# Patient Record
Sex: Female | Born: 1970
Health system: Southern US, Community
[De-identification: ages and names within clinical notes are randomized; demographics above are authoritative.]

## PROBLEM LIST (undated history)

## (undated) DIAGNOSIS — E119 Type 2 diabetes mellitus without complications: Secondary | ICD-10-CM

## (undated) DIAGNOSIS — I10 Essential (primary) hypertension: Secondary | ICD-10-CM

## (undated) DIAGNOSIS — I471 Supraventricular tachycardia, unspecified: Secondary | ICD-10-CM

## (undated) DIAGNOSIS — J8489 Other specified interstitial pulmonary diseases: Secondary | ICD-10-CM

## (undated) HISTORY — DX: Supraventricular tachycardia: I47.1

## (undated) HISTORY — DX: Supraventricular tachycardia, unspecified: I47.10

## (undated) HISTORY — DX: Essential (primary) hypertension: I10

## (undated) HISTORY — PX: LUNG BIOPSY: SHX232

---

## 2008-07-23 ENCOUNTER — Encounter: Admission: RE | Admit: 2008-07-23 | Discharge: 2008-07-23 | Payer: Self-pay | Admitting: Family Medicine

## 2008-08-26 ENCOUNTER — Encounter: Admission: RE | Admit: 2008-08-26 | Discharge: 2008-08-26 | Payer: Self-pay | Admitting: Family Medicine

## 2008-08-29 ENCOUNTER — Encounter: Admission: RE | Admit: 2008-08-29 | Discharge: 2008-08-29 | Payer: Self-pay | Admitting: Family Medicine

## 2008-09-11 DIAGNOSIS — J449 Chronic obstructive pulmonary disease, unspecified: Secondary | ICD-10-CM | POA: Insufficient documentation

## 2008-09-11 DIAGNOSIS — I10 Essential (primary) hypertension: Secondary | ICD-10-CM | POA: Insufficient documentation

## 2008-09-12 ENCOUNTER — Ambulatory Visit: Payer: Self-pay | Admitting: Pulmonary Disease

## 2008-09-12 DIAGNOSIS — J8409 Other alveolar and parieto-alveolar conditions: Secondary | ICD-10-CM | POA: Insufficient documentation

## 2008-09-12 LAB — CONVERTED CEMR LAB
Basophils Absolute: 0.1 10*3/uL (ref 0.0–0.1)
Eosinophils Absolute: 0.8 10*3/uL — ABNORMAL HIGH (ref 0.0–0.7)
Hemoglobin: 12.2 g/dL (ref 12.0–15.0)
Lymphocytes Relative: 12.1 % (ref 12.0–46.0)
Lymphs Abs: 1.4 10*3/uL (ref 0.7–4.0)
MCHC: 34.2 g/dL (ref 30.0–36.0)
MCV: 77.8 fL — ABNORMAL LOW (ref 78.0–100.0)
Monocytes Absolute: 0.5 10*3/uL (ref 0.1–1.0)
Neutro Abs: 8.8 10*3/uL — ABNORMAL HIGH (ref 1.4–7.7)
RDW: 17.1 % — ABNORMAL HIGH (ref 11.5–14.6)
aPTT: 27.8 s (ref 21.7–28.8)

## 2008-09-17 ENCOUNTER — Ambulatory Visit: Admission: RE | Admit: 2008-09-17 | Discharge: 2008-09-17 | Payer: Self-pay | Admitting: Pulmonary Disease

## 2008-09-17 ENCOUNTER — Encounter: Payer: Self-pay | Admitting: Pulmonary Disease

## 2008-09-17 ENCOUNTER — Ambulatory Visit: Payer: Self-pay | Admitting: Pulmonary Disease

## 2008-09-18 ENCOUNTER — Telehealth (INDEPENDENT_AMBULATORY_CARE_PROVIDER_SITE_OTHER): Payer: Self-pay | Admitting: *Deleted

## 2008-09-23 ENCOUNTER — Ambulatory Visit: Payer: Self-pay | Admitting: Pulmonary Disease

## 2008-10-21 ENCOUNTER — Ambulatory Visit: Payer: Self-pay | Admitting: Pulmonary Disease

## 2008-11-20 ENCOUNTER — Encounter: Payer: Self-pay | Admitting: Pulmonary Disease

## 2008-11-21 ENCOUNTER — Ambulatory Visit: Payer: Self-pay | Admitting: Pulmonary Disease

## 2008-12-02 ENCOUNTER — Ambulatory Visit: Payer: Self-pay | Admitting: Thoracic Surgery

## 2008-12-26 ENCOUNTER — Ambulatory Visit: Payer: Self-pay | Admitting: Thoracic Surgery

## 2008-12-26 ENCOUNTER — Encounter: Payer: Self-pay | Admitting: Pulmonary Disease

## 2008-12-26 ENCOUNTER — Encounter: Payer: Self-pay | Admitting: Thoracic Surgery

## 2008-12-26 ENCOUNTER — Inpatient Hospital Stay (HOSPITAL_COMMUNITY): Admission: RE | Admit: 2008-12-26 | Discharge: 2008-12-30 | Payer: Self-pay | Admitting: Thoracic Surgery

## 2009-01-06 ENCOUNTER — Ambulatory Visit: Payer: Self-pay | Admitting: Thoracic Surgery

## 2009-01-14 ENCOUNTER — Encounter: Admission: RE | Admit: 2009-01-14 | Discharge: 2009-01-14 | Payer: Self-pay | Admitting: Thoracic Surgery

## 2009-01-14 ENCOUNTER — Ambulatory Visit: Payer: Self-pay | Admitting: Pulmonary Disease

## 2009-01-14 ENCOUNTER — Ambulatory Visit: Payer: Self-pay | Admitting: Thoracic Surgery

## 2009-01-14 LAB — CONVERTED CEMR LAB: Sed Rate: 20 mm/hr (ref 0–22)

## 2009-01-27 LAB — CONVERTED CEMR LAB: ds DNA Ab: <1 (ref <5)

## 2009-02-07 DIAGNOSIS — I272 Pulmonary hypertension, unspecified: Secondary | ICD-10-CM

## 2009-02-07 HISTORY — DX: Pulmonary hypertension, unspecified: I27.20

## 2009-02-27 ENCOUNTER — Ambulatory Visit: Payer: Self-pay | Admitting: Pulmonary Disease

## 2009-03-04 ENCOUNTER — Encounter: Payer: Self-pay | Admitting: Pulmonary Disease

## 2009-03-19 ENCOUNTER — Ambulatory Visit: Payer: Self-pay | Admitting: Pulmonary Disease

## 2009-04-08 ENCOUNTER — Telehealth (INDEPENDENT_AMBULATORY_CARE_PROVIDER_SITE_OTHER): Payer: Self-pay | Admitting: *Deleted

## 2009-04-28 ENCOUNTER — Telehealth: Payer: Self-pay | Admitting: Pulmonary Disease

## 2009-05-06 ENCOUNTER — Encounter: Payer: Self-pay | Admitting: Pulmonary Disease

## 2009-05-12 ENCOUNTER — Telehealth (INDEPENDENT_AMBULATORY_CARE_PROVIDER_SITE_OTHER): Payer: Self-pay | Admitting: *Deleted

## 2009-06-18 ENCOUNTER — Encounter: Payer: Self-pay | Admitting: Pulmonary Disease

## 2009-11-23 ENCOUNTER — Telehealth: Payer: Self-pay | Admitting: Pulmonary Disease

## 2009-12-09 ENCOUNTER — Encounter: Payer: Self-pay | Admitting: Pulmonary Disease

## 2010-02-28 ENCOUNTER — Encounter: Payer: Self-pay | Admitting: Thoracic Surgery

## 2010-03-09 NOTE — Assessment & Plan Note (Signed)
Summary: rov for NSIP and to discuss pfts.   Copy to:  Felicia Linden, NP Primary Provider/Referring Provider:  [name]  CC:  Pt is here for a f/u appt to discuss PFT results.  Pt is currently on 22m of Prednisone.  Pt states breathing is "ok."  Pt states still sob with exertion.  Pt states occ cough with white sputum.  .[name] History of Present Illness: The pt comes in today for f/u of her known pulmonary fibrosis secondary to NSIP.  She has been treated with chronic prednisone, and although her doe and cough are better, she seems to have reached a plateau.  She still has significant doe with her ADL's, but she has also gained considerable weight since being on prednisone.  She has not had worsening LE edema.  Her recent pfts show no significant obstruction, severe restriction, and a severe decrease in DLCO.  Medications Prior to Update: 1)  Tylenol Pm Extra Strength 500-25 Mg Tabs (Diphenhydramine-Apap (Sleep)) .... As Needed 2)  Clotrimazole 100 Mg Tabs (Clotrimazole) .... 5x Times Daily For Thrush 3)  Atenolol 50 Mg Tabs (Atenolol) .... Take 1 Tablet By Mouth Once A Day 4)  Prednisone 10 Mg  Tabs (Prednisone) .... Take 2 Tabs By Mouth Daily 5)  Percocet 5-325 Mg Tabs (Oxycodone-Acetaminophen) .... As Needed 6)  Mucinex Maximum Strength 1200 Mg Xr12h-Tab (Guaifenesin) .... Use As Needed  Allergies (verified): No Known Drug Allergies  Review of Systems      See HPI  Vital Signs:  Patient profile:   40year old female Height:      64 inches Weight:      263 pounds BMI:     45.31 O2 Sat:      90 % on Room air Temp:     98.0 degrees F oral Pulse rate:   85 / minute BP sitting:   132 / 78  (left arm) Cuff size:   large  Vitals Entered By: MMatthew FolksLPN (February 10, 213083:18 PM)  O2 Flow:  Room air CC: Pt is here for a f/u appt to discuss PFT results.  Pt is currently on 231mof Prednisone.  Pt states breathing is "ok."  Pt states still sob with exertion.  Pt states occ cough  with white sputum.   Comments Medications reviewed with patient MeMatthew FolksPN  February 10, 206578:18 PM    Physical Exam  General:  obese female in nad Lungs:  crackles in both bases, no wheezing Heart:  rrr Extremities:  mild edema, no cyanosis   Impression & Recommendations:  Problem # 1:  UNSPEC ALVEOLAR&PARIETOALVEOLAR PNEUMONOPATHY (ICD-516.9) the pt has fibrosing variant of NSIP, and really has not had a dramatic improvement to prednisone.  She does better at 4036may, has reached a plateau at 41m65my, and has seen decline below 41mg26m.  Her pfts today show severe restriction and severe decrease in DLCO, some of which may be due to her obesity.  Given her young age, only partial response to prednisone, I would like to have her evaluated at [facility] to [facility]ational [facility], and can provide transplant evaluation when time comes (although she must lose weight).  If they feel a more aggressive immunosuppressive regimen should be tried such as imuran or cytoxan, I am happy to follow her for this locally.  Other Orders: Est. Patient Level III (9921(46962monary Referral (Pulmonary)  Patient Instructions: 1)  will refer to [facility] for your lung  disease to get their input. 2)  continue to work on weight loss 3)  stay on prednisone 65m a day for now. 4)  follow up with me 8wks, but may be sooner depending upon how quickly [facility] can see you.

## 2010-03-09 NOTE — Progress Notes (Signed)
Summary: fax request (re-fax)  Phone Note Call from Patient Call back at Home Phone [phone]   Caller: Patient Call For: clance Summary of Call: pt requests that the letter that was previously faxed for her to [employer] be re-faxed as they didn't receive this. fax to attn: kristi balint: [phone]. pt also wants this faxed to her as well. fax to attn: Kenzli Caples: [phone].  Initial call taken by: Cooper Render, CNA,  May 12, 2009 1:02 PM  Follow-up for Phone Call        faxed letter to both recepients/Juanita Follow-up by: Netta Neat,  May 12, 2009 4:37 PM

## 2010-03-09 NOTE — Letter (Signed)
Summary: Pulmonary/[facility]  Pulmonary/[facility]   Imported By: Bubba Hales 08/06/2009 10:54:53  _____________________________________________________________________  External Attachment:    Type:   Image     Comment:   External Document

## 2010-03-09 NOTE — Progress Notes (Signed)
Summary: needs letter  Phone Note Call from Patient Call back at Work Phone [phone]   Caller: Patient Call For: Kaity Pitstick Summary of Call: needs a letter stateing that she was not able to do her school work.   may june and july up until december 2010 letter needs to be faxed to [facility] at [phone] Initial call taken by: Adin Hector,  April 28, 2009 1:06 PM  Follow-up for Phone Call        lmtcb   [place]  April 28, 2009 4:38 PM   Spoke with pt.  She is requesting a detail letter be faxed to her school regarding all of the times that she has been ill this past year.  Pt needs letter to state the nature of her illness and what impact doing schoolwork could have.  Please advise if you are willing to do this, thanks Tilden Dome  April 29, 2009 9:47 AM   Additional Follow-up for Phone Call Additional follow up Details #1::        I can give her a letter stating that she has a lung condition which has required surgery, hospitalization, and ongoing treatment.  Did she ever get seen by [facility]? Additional Follow-up by: Kathee Delton MD,  April 29, 2009 6:01 PM    Additional Follow-up for Phone Call Additional follow up Details #2::    The patient is seeing Dr. [name] on Wed., 05/06/2009 at [facility]. She would also like to have a copy of the letter mailed to her home.Francesca Jewett CMA  April 30, 2009 9:02 AM  Additional Follow-up for Phone Call Additional follow up Details #3:: Details for Additional Follow-up Action Taken: noted.  has been dictated. Additional Follow-up by: Kathee Delton MD,  April 30, 2009 5:15 PM   Appended Document: needs letter letter faxed to fax # pt provided.  LMOM informing pt of this.

## 2010-03-09 NOTE — Letter (Signed)
Summary: Lung Program/[facility]  Lung Program/[facility]   Imported By: Bubba Hales 08/06/2009 10:29:09  _____________________________________________________________________  External Attachment:    Type:   Image     Comment:   External Document

## 2010-03-09 NOTE — Miscellaneous (Signed)
Summary: pft results.  Clinical Lists Changes  pfts done and show mod to severe restriction.  Needs ov to f/u how things are going on prednisone therapy.  Appended Document: pft results. megan, pt needs ov to f/u how things are going and to discuss pfts.  Appended Document: pft results. called and spoke with pt.  pt made an appt with [name] for 03-19-09 at 3:45pm.

## 2010-03-09 NOTE — Progress Notes (Signed)
Summary: speak to nurse  Phone Note Call from Patient Call back at Work Phone [phone]   Caller: Patient Call For: clance Summary of Call: Pt said she was supposed to have an appt. with [facility] but she still hasn't heard anything from them, was told to call [facility] office if she didn't hear from [place], pls. advise. Initial call taken by: Netta Neat,  April 08, 2009 2:17 PM  Follow-up for Phone Call        [place], i'm unsure of the protocol with this situation.  please advise, thanks! Parke Poisson CNA  April 08, 2009 3:13 PM   Additional Follow-up for Phone Call Additional follow up Details #1::        pt scheduled to see dr Keenan Bachelor [email or web address]  05/06/09_1 :30pm paperwork will be mailed to pt  Additional Follow-up by: Ova Freshwater,  April 09, 2009 9:46 AM

## 2010-03-09 NOTE — Miscellaneous (Signed)
Summary: Orders Update pft charges  Clinical Lists Changes  Orders: Added new Service order of Carbon Monoxide diffusing w/capacity (94720) - Signed Added new Service order of Lung Volumes (94240) - Signed Added new Service order of Spirometry (Pre & Post) (94060) - Signed 

## 2010-03-09 NOTE — Progress Notes (Signed)
Summary: refill on predisone/cb----needs ov  Phone Note Call from Patient Call back at Work Phone [phone]   Caller: Patient Call For: clance Summary of Call: pt is needing her predisone refilled [facility] Initial call taken by: Don Broach,  November 23, 2009 11:11 AM  Follow-up for Phone Call        [name].  pt last saw [facility] Feb 2011 and was told to f/u in 8 weeks or sooner depending on how soon she could be seen by [facility].  Looks like per scanned documents on 05-06-2009, pt has seen [facility].  Therefore pt is due for a f/u appt.  Jinny Blossom Reynolds LPN  November 24, 7274 1:35 PM   pt set to see [facility] on 12-10-09 at [place]  November 23, 2009 3:12 PM     Prescriptions: PREDNISONE 10 MG  TABS (PREDNISONE) take 2 tabs by mouth daily  #60 Each x 0   Entered by:   Whitehawk Bing CMA   Authorized by:   Kathee Delton MD   Signed by:   Lipscomb Bing CMA on 11/23/2009   Method used:   Electronically to        [facility]  [phone]* (retail)       [address]       [place], [place]  [address]       Ph: [phone] or [phone]       Fax: [phone]   RxID:   [id number]

## 2010-03-17 NOTE — Letter (Signed)
Summary: [name] MD/[facility] Pulmonary  [name] MD/[facility] Pulmonary   Imported By: Bubba Hales 03/11/2010 10:30:37  _____________________________________________________________________  External Attachment:    Type:   Image     Comment:   External Document

## 2010-05-12 LAB — COMPREHENSIVE METABOLIC PANEL
ALT: 29 U/L (ref 0–35)
AST: 19 U/L (ref 0–37)
Albumin: 3.3 g/dL — ABNORMAL LOW (ref 3.5–5.2)
Alkaline Phosphatase: 41 U/L (ref 39–117)
Alkaline Phosphatase: 48 U/L (ref 39–117)
BUN: 7 mg/dL (ref 6–23)
CO2: 24 mEq/L (ref 19–32)
Chloride: 105 mEq/L (ref 96–112)
Chloride: 106 mEq/L (ref 96–112)
Creatinine, Ser: 0.84 mg/dL (ref 0.4–1.2)
GFR calc Af Amer: >60 mL/min (ref >60)
GFR calc non Af Amer: >60 mL/min (ref >60)
GFR calc non Af Amer: >60 mL/min (ref >60)
Glucose, Bld: 162 mg/dL — ABNORMAL HIGH (ref 70–99)
Potassium: 3.8 mEq/L (ref 3.5–5.1)
Potassium: 4 mEq/L (ref 3.5–5.1)
Total Bilirubin: 0.5 mg/dL (ref 0.3–1.2)
Total Bilirubin: 0.6 mg/dL (ref 0.3–1.2)
Total Protein: 6.1 g/dL (ref 6.0–8.3)

## 2010-05-12 LAB — GLUCOSE, CAPILLARY
Glucose-Capillary: 110 mg/dL — ABNORMAL HIGH (ref 70–99)
Glucose-Capillary: 112 mg/dL — ABNORMAL HIGH (ref 70–99)
Glucose-Capillary: 125 mg/dL — ABNORMAL HIGH (ref 70–99)
Glucose-Capillary: 146 mg/dL — ABNORMAL HIGH (ref 70–99)
Glucose-Capillary: 154 mg/dL — ABNORMAL HIGH (ref 70–99)
Glucose-Capillary: 169 mg/dL — ABNORMAL HIGH (ref 70–99)

## 2010-05-12 LAB — BASIC METABOLIC PANEL
BUN: 7 mg/dL (ref 6–23)
CO2: 30 mEq/L (ref 19–32)
CO2: 31 mEq/L (ref 19–32)
Calcium: 8.3 mg/dL — ABNORMAL LOW (ref 8.4–10.5)
Calcium: 8.5 mg/dL (ref 8.4–10.5)
Creatinine, Ser: 0.75 mg/dL (ref 0.4–1.2)
GFR calc Af Amer: >60 mL/min (ref >60)
GFR calc Af Amer: >60 mL/min (ref >60)
GFR calc non Af Amer: >60 mL/min (ref >60)
GFR calc non Af Amer: >60 mL/min (ref >60)
Glucose, Bld: 106 mg/dL — ABNORMAL HIGH (ref 70–99)
Glucose, Bld: 93 mg/dL (ref 70–99)
Potassium: 3.3 mEq/L — ABNORMAL LOW (ref 3.5–5.1)
Sodium: 135 mEq/L (ref 135–145)
Sodium: 142 mEq/L (ref 135–145)

## 2010-05-12 LAB — BLOOD GAS, ARTERIAL
Acid-Base Excess: 2.4 mmol/L — ABNORMAL HIGH (ref 0.0–2.0)
Bicarbonate: 25.5 mEq/L — ABNORMAL HIGH (ref 20.0–24.0)
Drawn by: 206361
FIO2: 0.21 %
Patient temperature: 98.4
TCO2: 27.6 mmol/L (ref 0–100)
pCO2 arterial: 40.2 mmHg (ref 35.0–45.0)
pCO2 arterial: 45.6 mmHg — ABNORMAL HIGH (ref 35.0–45.0)
pH, Arterial: 7.366 (ref 7.350–7.400)
pH, Arterial: 7.432 — ABNORMAL HIGH (ref 7.350–7.400)
pO2, Arterial: 66 mmHg — ABNORMAL LOW (ref 80.0–100.0)
pO2, Arterial: 86.3 mmHg (ref 80.0–100.0)

## 2010-05-12 LAB — CBC
HCT: 37.6 % (ref 36.0–46.0)
HCT: 40.3 % (ref 36.0–46.0)
Hemoglobin: 12.4 g/dL (ref 12.0–15.0)
Hemoglobin: 13.2 g/dL (ref 12.0–15.0)
Hemoglobin: 13.4 g/dL (ref 12.0–15.0)
MCHC: 32.7 g/dL (ref 30.0–36.0)
MCHC: 32.8 g/dL (ref 30.0–36.0)
MCV: 84.5 fL (ref 78.0–100.0)
Platelets: 252 10*3/uL (ref 150–400)
RBC: 4.76 MIL/uL (ref 3.87–5.11)
RBC: 4.8 MIL/uL (ref 3.87–5.11)
RBC: 4.98 MIL/uL (ref 3.87–5.11)
RDW: 17.8 % — ABNORMAL HIGH (ref 11.5–15.5)
RDW: 17.8 % — ABNORMAL HIGH (ref 11.5–15.5)
RDW: 18.1 % — ABNORMAL HIGH (ref 11.5–15.5)
WBC: 12.4 10*3/uL — ABNORMAL HIGH (ref 4.0–10.5)
WBC: 9.2 10*3/uL (ref 4.0–10.5)

## 2010-05-12 LAB — URINALYSIS, ROUTINE W REFLEX MICROSCOPIC
Nitrite: NEGATIVE
Specific Gravity, Urine: 1.019 (ref 1.005–1.030)
Urobilinogen, UA: 0.2 mg/dL (ref 0.0–1.0)
pH: 7.5 (ref 5.0–8.0)

## 2010-05-12 LAB — APTT: aPTT: 27 seconds (ref 24–37)

## 2010-05-12 LAB — TYPE AND SCREEN

## 2010-05-12 LAB — FUNGUS CULTURE W SMEAR: Fungal Smear: NONE SEEN

## 2010-05-12 LAB — URINE MICROSCOPIC-ADD ON

## 2010-05-12 LAB — TISSUE CULTURE: Culture: NO GROWTH

## 2010-05-12 LAB — AFB CULTURE WITH SMEAR (NOT AT ARMC)

## 2010-05-16 LAB — BODY FLUID CELL COUNT WITH DIFFERENTIAL
Eos, Fluid: 25 %
Monocyte-Macrophage-Serous Fluid: 12 % — ABNORMAL LOW (ref 50–90)
Total Nucleated Cell Count, Fluid: 2075 cu mm — ABNORMAL HIGH (ref 0–1000)

## 2010-05-16 LAB — PATHOLOGIST SMEAR REVIEW

## 2010-05-16 LAB — PNEUMOCYSTIS JIROVECI SMEAR BY DFA: Pneumocystis jiroveci Ag: NOT DETECTED

## 2010-05-16 LAB — FUNGUS CULTURE W SMEAR

## 2010-05-16 LAB — CULTURE, RESPIRATORY W GRAM STAIN

## 2010-05-16 LAB — AFB CULTURE WITH SMEAR (NOT AT ARMC): Acid Fast Smear: NONE SEEN

## 2010-05-16 LAB — LEGIONELLA PROFILE(CULTURE+DFA/SMEAR)

## 2010-06-22 NOTE — Letter (Signed)
April 30, 2009     RE:  [name], BUMP  MRN:  [id number]  /  DOB:  [birth year]   To whom it may concern:   I am writing on behalf of Felicia [name], a patient that I  follow for ongoing pulmonary issues.  [name] has a serious lung  condition that has been in treatment since last summer, and she has  actually been hospitalized for this condition.  It is obvious that her  ongoing health issues have interfered with her quality of life, and her  ability to do her activities of daily living.  This letter is to verify  that she does indeed have a significant medical illness that has been  present since the summer, and therefore, she should be given leeway  regarding various duties.    Sincerely,      Kathee Delton, MD,FCCP  Electronically Signed    [facility]/MedQ  DD: 04/30/2009  DT: 05/01/2009  Job #: [id number]

## 2010-06-22 NOTE — Letter (Signed)
December 02, 2008   Kathee Delton, MD, FCCP  [address], [place] [address]   Re:  Felicia [name], TOMARO                  DOB:  1970-11-03   Dr. Lanny Hurst,   I appreciate the opportunity of seeing the patient.  This 40 year old  African American female has had progressive shortness of breath and has  been treated by you since March.  A CT scan in March showed bilateral  lower lobe airspace disease that was felt to be possible eosinophilic  pneumonia.  Despite treatment with steroid, it had some improvement, but  no marked improvement.  She is presently on 10 mg of prednisone a day  and is referred here for possible lung biopsy.  When she is on a high-  dose steroid, she has symptoms of facial swelling.  She has no cough.   Her medications include Tylenol PM, atenolol 25 mg day, and prednisone  40 mg a day.   She has no allergies.   Her family history is noncontributory except that her father has  sarcoidosis.  She also has hypertension.  She is single and has 1 child.  Does not smoke.  Does not drink alcohol on a regular basis.   REVIEW OF SYMPTOMS:  GENERAL:  She is 248 pounds.  She is 5 feet 4  inches.  She has had some weight gain that has been secondary to  prednisone.  CARDIAC:  She has got no angina or atrial fibrillation.  PULMONARY:  She has shortness of breath with exertion and no hemoptysis  or asthma.  GI:  No nausea, vomiting, constipation, diarrhea.  GU:  No kidney disease, dysuria, or frequent urination.  VASCULAR:  No claudication, DVT, TIAs.  NEUROLOGIC:  No dizziness, headaches, blackouts, or seizures.  MUSCULOSKELETAL:  No arthritis and joint pain.  PSYCHIATRIC:  No depression or nervousness.  EYE AND ENT:  No change in eyesight or hearing.  NEUROLOGIC:  No problems with bleeding, clotting disorders, or anemia.   PHYSICAL EXAMINATION:  She is an obese African American female in no  acute distress.  Her blood pressure is 160/100, pulse 61,  respirations  18, sats are 98%.  Head, eyes, ears, nose, and throat are unremarkable.  Neck is supple without thyromegaly.  There is no supraclavicular or  axillary adenopathy.  Chest is clear to auscultation and percussion.  Heart regular sinus rhythm.  No murmurs.  Abdomen soft.  No  splenomegaly.  Extremity pulses are 2+, no clubbing or edema.  Neurological, she is oriented x3.  Sensory and motor intact.  Cranial  nerves intact.   I think the patient would benefit from a biopsy, and I discussed the  risk and benefits of a left VATS lung biopsy.  We plan to do this on the  19th at [facility].  I appreciate the opportunity of seeing the  patient.   Nicanor Alcon, M.D.  Electronically Signed   DPB/MEDQ  D:  12/02/2008  T:  12/03/2008  Job:  [id number]

## 2010-06-22 NOTE — Op Note (Signed)
[name], [name]          ACCOUNT NO.:  [id number]   MEDICAL RECORD NO.:  [id number]          PATIENT TYPE:  AMB   LOCATION:  CARD                         FACILITY:  [name]   PHYSICIAN:  Kathee Delton, MD,FCCPDATE OF BIRTH:  05/26/69   DATE OF PROCEDURE:  09/17/2008  DATE OF DISCHARGE:  09/17/2008                               OPERATIVE REPORT   TITLE OF PROCEDURE:  Procedure is flexible fiberoptic bronchoscopy with  biopsy.   INDICATIONS FOR PROCEDURE:  Pulmonary infiltrates and cough of unknown  origin.   ANESTHESIA:  Versed and Demerol in various aliquots for conscious  sedation.  Please see bronchoscopy report for dosages.   DESCRIPTION OF PROCEDURE:  After obtaining informed consent under close  cardiopulmonary monitoring the above preop anesthesia was given and the  fiberoptic scope was passed into the right naris and into the posterior  pharynx.  There were  no lesions or other abnormalities seen.  The vocal  cords appeared to be within normal limits and moved bilaterally on  phonation.  Scope was then passed into the trachea where it was examined  along its entire length down to the level of the carina all of which was  normal.  The left and right tracheobronchial trees were examined  serially at the subsegmental level with no endobronchial abnormality  being found.  Attention was then paid to the right tracheobronchial tree  where bronchoalveolar lavage was done from various segments and sent for  bacteriologic and cytologic evaluation.  Transbronchial biopsies were  also done under fluoroscopic guidance in the right lung with average  specimens being obtained.  It was very difficult to do transbronchial  biopsies under fluoroscopy due to cough.   There was good hemostasis maintained throughout the procedure, the  patient had no difficulties with desaturation.  Overall she tolerated  the procedure well, there no complications.  Chest x-ray post procedure  showed no pneumothorax.      Kathee Delton, MD,FCCP  Electronically Signed     [facility]/MEDQ  D:  10/08/2008  T:  10/08/2008  Job:  [id number]

## 2010-06-22 NOTE — Letter (Signed)
January 14, 2009   Kathee Delton, MD,FCCP  [address] [address]   Re:  KADEDRA, VANAKEN                  DOB:  13-Jun-1970   Dear Lanny Hurst,   I saw the patient back after a lung biopsy.  Her pathology shows  nonspecific interstitial pneumonitis and she will be seeing you today.  Her chest x-ray showed normal postoperative changes.  Her sats were 94%,  blood pressure is 140/80, pulse 100, respirations were 18.  Overall, she  looks good and as I mentioned her incisions look good.  I will see her  back again in 6 weeks with a chest x-ray.   Nicanor Alcon, M.D.  Electronically Signed   DPB/MEDQ  D:  01/14/2009  T:  01/14/2009  Job:  [id number]

## 2012-10-01 ENCOUNTER — Ambulatory Visit
Admission: RE | Admit: 2012-10-01 | Discharge: 2012-10-01 | Disposition: A | Discharge status: Home / Self Care | Payer: 59 | Source: Ambulatory Visit | Attending: Family | Admitting: Family

## 2012-10-01 ENCOUNTER — Other Ambulatory Visit: Payer: Self-pay | Admitting: Family

## 2012-10-01 DIAGNOSIS — N048 Nephrotic syndrome with other morphologic changes: Secondary | ICD-10-CM

## 2014-02-04 ENCOUNTER — Telehealth (HOSPITAL_COMMUNITY): Payer: Self-pay

## 2014-02-04 NOTE — Telephone Encounter (Signed)
Called patient regarding Pulmonary Rehab referral.  Patient stated that the class times would interfere with her work schedule.  Felicia [name] stated that she would think about the information she was given and follow up with me.

## 2014-09-23 ENCOUNTER — Emergency Department (HOSPITAL_COMMUNITY)
Admission: EM | Admit: 2014-09-23 | Discharge: 2014-09-23 | Disposition: A | Discharge status: Home / Self Care | Payer: 59 | Attending: Emergency Medicine | Admitting: Emergency Medicine

## 2014-09-23 ENCOUNTER — Ambulatory Visit (INDEPENDENT_AMBULATORY_CARE_PROVIDER_SITE_OTHER): Payer: 59 | Admitting: Family Medicine

## 2014-09-23 ENCOUNTER — Encounter (HOSPITAL_COMMUNITY): Payer: Self-pay | Admitting: Emergency Medicine

## 2014-09-23 VITALS — BP 120/90 | HR 163 | Temp 98.2°F | Resp 16 | Ht 65.35 in | Wt 222.0 lb

## 2014-09-23 DIAGNOSIS — R000 Tachycardia, unspecified: Secondary | ICD-10-CM

## 2014-09-23 DIAGNOSIS — I499 Cardiac arrhythmia, unspecified: Secondary | ICD-10-CM

## 2014-09-23 DIAGNOSIS — Z792 Long term (current) use of antibiotics: Secondary | ICD-10-CM | POA: Insufficient documentation

## 2014-09-23 DIAGNOSIS — Z8709 Personal history of other diseases of the respiratory system: Secondary | ICD-10-CM | POA: Insufficient documentation

## 2014-09-23 DIAGNOSIS — R002 Palpitations: Secondary | ICD-10-CM | POA: Diagnosis present

## 2014-09-23 DIAGNOSIS — Z7952 Long term (current) use of systemic steroids: Secondary | ICD-10-CM | POA: Diagnosis not present

## 2014-09-23 DIAGNOSIS — Z79899 Other long term (current) drug therapy: Secondary | ICD-10-CM | POA: Diagnosis not present

## 2014-09-23 DIAGNOSIS — R0602 Shortness of breath: Secondary | ICD-10-CM | POA: Insufficient documentation

## 2014-09-23 DIAGNOSIS — J8489 Other specified interstitial pulmonary diseases: Secondary | ICD-10-CM

## 2014-09-23 DIAGNOSIS — J849 Interstitial pulmonary disease, unspecified: Secondary | ICD-10-CM

## 2014-09-23 DIAGNOSIS — I10 Essential (primary) hypertension: Secondary | ICD-10-CM | POA: Insufficient documentation

## 2014-09-23 DIAGNOSIS — R030 Elevated blood-pressure reading, without diagnosis of hypertension: Secondary | ICD-10-CM | POA: Diagnosis not present

## 2014-09-23 DIAGNOSIS — I471 Supraventricular tachycardia: Secondary | ICD-10-CM | POA: Insufficient documentation

## 2014-09-23 LAB — I-STAT TROPONIN, ED: TROPONIN I, POC: 0.06 ng/mL (ref 0.00–0.08)

## 2014-09-23 LAB — CBC WITH DIFFERENTIAL/PLATELET
BASOS ABS: 0.1 10*3/uL (ref 0.0–0.1)
Basophils Relative: 1 % (ref 0–1)
EOS PCT: 7 % — AB (ref 0–5)
Eosinophils Absolute: 0.3 10*3/uL (ref 0.0–0.7)
HEMATOCRIT: 39.4 % (ref 36.0–46.0)
HEMOGLOBIN: 12.5 g/dL (ref 12.0–15.0)
LYMPHS ABS: 1.8 10*3/uL (ref 0.7–4.0)
LYMPHS PCT: 37 % (ref 12–46)
MCH: 24.7 pg — AB (ref 26.0–34.0)
MCHC: 31.7 g/dL (ref 30.0–36.0)
MCV: 77.7 fL — AB (ref 78.0–100.0)
Monocytes Absolute: 0.5 10*3/uL (ref 0.1–1.0)
Monocytes Relative: 11 % (ref 3–12)
NEUTROS ABS: 2.1 10*3/uL (ref 1.7–7.7)
NEUTROS PCT: 44 % (ref 43–77)
PLATELETS: 345 10*3/uL (ref 150–400)
RBC: 5.07 MIL/uL (ref 3.87–5.11)
RDW: 16.5 % — ABNORMAL HIGH (ref 11.5–15.5)
WBC: 4.8 10*3/uL (ref 4.0–10.5)

## 2014-09-23 LAB — COMPREHENSIVE METABOLIC PANEL
ALK PHOS: 50 U/L (ref 38–126)
ALT: 9 U/L — AB (ref 14–54)
AST: 36 U/L (ref 15–41)
Albumin: 3.2 g/dL — ABNORMAL LOW (ref 3.5–5.0)
Anion gap: 11 (ref 5–15)
BUN: 10 mg/dL (ref 6–20)
CALCIUM: 8.8 mg/dL — AB (ref 8.9–10.3)
CHLORIDE: 99 mmol/L — AB (ref 101–111)
CO2: 26 mmol/L (ref 22–32)
CREATININE: 0.92 mg/dL (ref 0.44–1.00)
Glucose, Bld: 121 mg/dL — ABNORMAL HIGH (ref 65–99)
Potassium: 3.9 mmol/L (ref 3.5–5.1)
Sodium: 136 mmol/L (ref 135–145)
Total Bilirubin: 0.6 mg/dL (ref 0.3–1.2)
Total Protein: 7.3 g/dL (ref 6.5–8.1)

## 2014-09-23 LAB — TSH: TSH: 3.678 u[IU]/mL (ref 0.350–4.500)

## 2014-09-23 MED ORDER — ATENOLOL 100 MG PO TABS: 100.0000 mg | ORAL_TABLET | Freq: Every day | ORAL | Status: DC

## 2014-09-23 MED ORDER — VERAPAMIL HCL ER 120 MG PO TBCR
120.0000 mg | EXTENDED_RELEASE_TABLET | Freq: Every day | ORAL | Status: DC
Start: 2014-09-23 — End: 2014-09-23

## 2014-09-23 MED ORDER — VERAPAMIL HCL ER 120 MG PO TBCR: 120.0000 mg | EXTENDED_RELEASE_TABLET | Freq: Every day | ORAL | Status: DC

## 2014-09-23 MED ORDER — ADENOSINE 6 MG/2ML IV SOLN
INTRAVENOUS | Status: AC
  Filled 2014-09-23: qty 4

## 2014-09-23 MED ORDER — VERAPAMIL HCL 2.5 MG/ML IV SOLN
5.0000 mg | Freq: Once | INTRAVENOUS | Status: AC
  Administered 2014-09-23: 5 mg via INTRAVENOUS
  Filled 2014-09-23: qty 2

## 2014-09-23 MED ORDER — VERAPAMIL HCL ER 180 MG PO TBCR
180.0000 mg | EXTENDED_RELEASE_TABLET | Freq: Every day | ORAL | Status: DC
  Administered 2014-09-23: 180 mg via ORAL
  Filled 2014-09-23: qty 1

## 2014-09-23 MED ORDER — ADENOSINE 6 MG/2ML IV SOLN: 12.0000 mg | Freq: Once | INTRAVENOUS | Status: DC

## 2014-09-23 NOTE — Progress Notes (Signed)
[facility] [address], [place] [address] [phone]  Date:  09/23/2014   Name:  Cierria Height   DOB:  1970-12-14   MRN:  [id number]  PCP:  No primary care provider on file.    Chief Complaint: Hypertension   History of Present Illness:  This is a 44 y.o. female with PMH HTN and interstitial lung dz who is presenting with a 1 day history of rapid heart rate. She states she stood up a few hours ago and felt dizzy and felt her heart was racing. She has been out of her BP meds x 3 weeks. She continues to take her cellcept for interstitial lung dz. She denies SOB or CP. She has never had an abnormal rhythm before. No history of CAD or stroke.  Review of Systems:  Review of Systems See HPI  Patient Active Problem List   Diagnosis Date Noted  . UNSPEC ALVEOLAR&PARIETOALVEOLAR PNEUMONOPATHY 09/12/2008  . HYPERTENSION 09/11/2008  . BRONCHITIS 09/11/2008    Prior to Admission medications   Medication Sig Start Date End Date Taking? Authorizing Provider  atenolol (TENORMIN) 100 MG tablet Take 100 mg by mouth daily.   Yes Historical Provider, MD  atenolol (TENORMIN) 25 MG tablet Take by mouth daily.   Yes Historical Provider, MD  mycophenolate (CELLCEPT) 500 MG tablet Take by mouth 2 (two) times daily.   Yes Historical Provider, MD       Historical Provider, MD       Historical Provider, MD    No Known Allergies  History reviewed. No pertinent past surgical history.  Social History  Substance Use Topics  . Smoking status: None  . Smokeless tobacco: None  . Alcohol Use: None    Family History  Problem Relation Age of Onset  . Hypertension Mother   . Cancer Father   . Diabetes Father   . Hypertension Father   . Diabetes Sister   . Mental retardation Sister   . Cancer Maternal Grandmother   . Cancer Maternal Grandfather   . Cancer Paternal Grandmother   . Cancer Paternal Grandfather     Medication list has been reviewed and  updated.  Physical Examination:  Physical Exam  Constitutional: She is oriented to person, place, and time. She appears well-developed and well-nourished. No distress.  HENT:  Head: Normocephalic and atraumatic.  Right Ear: Hearing normal.  Left Ear: Hearing normal.  Nose: Nose normal.  Eyes: Conjunctivae and lids are normal. Right eye exhibits no discharge. Left eye exhibits no discharge. No scleral icterus.  Cardiovascular: Normal pulses.  An irregular rhythm present. Tachycardia present.   No murmur heard. Pulmonary/Chest: Effort normal and breath sounds normal. No respiratory distress. She has no wheezes. She has no rhonchi. She has no rales.  Musculoskeletal: Normal range of motion.  Neurological: She is alert and oriented to person, place, and time.  Skin: Skin is warm, dry and intact. No lesion and no rash noted.  Psychiatric: She has a normal mood and affect. Her speech is normal and behavior is normal. Thought content normal.   BP 120/90 mmHg  Pulse 163  Temp(Src) 98.2 F (36.8 C) (Oral)  Resp 16  Ht 5' 5.35" (1.66 m)  Wt 222 lb (100.699 kg)  BMI 36.54 kg/m2  SpO2 81%  EKG interpreted by Dr. Linna Darner: multifocial atrial tachycardia  HR 181 on arrival, 165 five minutes after arrival, 114 on EKG  Assessment and Plan:  1. Irregular heart rhythm 2. Tachycardia 3.  Essential hypertension 4. Interstitial lung disease EKG with rapid heart rate in 160-180s on arrival. Decreased to 114 when EKG performed. EKG showing likely multifocal atrial tachycardia. She denies SOB but has O2 sat of 81% on room air. She does have a history of interstitial lung dz which could be a partial contributor. No prior O2 sats for review. Called EMS for transport. She needs to be monitored and placed back on meds. Dr. Linna Darner helped with the care of the patient. - EKG 12-Lead   Benjaman Pott. Drenda Freeze, MHS [facility]  09/23/2014

## 2014-09-23 NOTE — ED Notes (Signed)
MD Caleb Popp at bedside having pt perform vagal maneuvars. Pt successful, HR @ 110 at this time, no adenosine needed per MD. EKG captured and exported.

## 2014-09-23 NOTE — ED Notes (Signed)
After 5 mg verapimil, pt HR @ 110 again, ST, per MD will not do adenosine.

## 2014-09-23 NOTE — ED Provider Notes (Signed)
CSN: [id number]     Arrival date & time 09/23/14  1641 History   First MD Initiated Contact with Patient 09/23/14 1642     Chief Complaint  Patient presents with  . Irregular Heart Beat     (Consider location/radiation/quality/duration/timing/severity/associated sxs/prior Treatment) HPI Comments: Patient seen at urgent care today for palpitations and increasing shortness of breath.  Patient with history of interstitial lung disease, followed by pulmonology at [facility].  At urgent care, found to be in narrow complex tachyarrhythmia with increasing shortness of breath. EMS contacted, and patient received 3 doses of adenosine (6, 12, 12) with temporary reduction of heart rate to around 110.  Upon arrival in ED, patient again with tachycardia in the 150's.  Vagal maneuver performed by Dr. Mingo Amber, with temporary reduction in heart rate.  Patient is a 44 y.o. female presenting with palpitations.  Palpitations Palpitations quality:  Irregular Onset quality:  Sudden Duration:  1 day Timing:  Intermittent Progression:  Waxing and waning Chronicity:  New Ineffective treatments:  Valsalva (adenosine) Associated symptoms: shortness of breath   Associated symptoms: no chest pain, no dizziness, no near-syncope and no syncope     Past Medical History  Diagnosis Date  . Hypertension    No past surgical history on file. Family History  Problem Relation Age of Onset  . Hypertension Mother   . Cancer Father   . Diabetes Father   . Hypertension Father   . Diabetes Sister   . Mental retardation Sister   . Cancer Maternal Grandmother   . Cancer Maternal Grandfather   . Cancer Paternal Grandmother   . Cancer Paternal Grandfather    Social History  Substance Use Topics  . Smoking status: None  . Smokeless tobacco: None  . Alcohol Use: None   OB History    No data available     Review of Systems  Respiratory: Positive for shortness of breath. Negative for chest tightness.   Cardiovascular:  Positive for palpitations. Negative for chest pain, syncope and near-syncope.  Neurological: Negative for dizziness.  All other systems reviewed and are negative.     Allergies  Review of patient's allergies indicates no known allergies.  Home Medications   Prior to Admission medications   Medication Sig Start Date End Date Taking? Authorizing Provider  atenolol (TENORMIN) 100 MG tablet Take 100 mg by mouth daily.    Historical Provider, MD  atenolol (TENORMIN) 25 MG tablet Take by mouth daily.    Historical Provider, MD  mycophenolate (CELLCEPT) 500 MG tablet Take by mouth 2 (two) times daily.    Historical Provider, MD  predniSONE (DELTASONE) 10 MG tablet Take 10 mg by mouth daily with breakfast.    Historical Provider, MD  Sulfamethoxazole-Trimethoprim (BACTRIM PO) Take by mouth.    Historical Provider, MD   BP 146/103 mmHg  Pulse 142  Resp 21  SpO2 100% Physical Exam  Constitutional: She is oriented to person, place, and time. She appears well-developed and well-nourished.  HENT:  Head: Normocephalic.  Eyes: Pupils are equal, round, and reactive to light.  Neck: Neck supple.  Cardiovascular: Normal heart sounds.   No murmur heard. Pulmonary/Chest: Breath sounds normal. She has no wheezes.  Abdominal: Soft. Bowel sounds are normal.  Musculoskeletal: She exhibits no edema or tenderness.  Neurological: She is alert and oriented to person, place, and time.  Skin: Skin is warm and dry.  Psychiatric: She has a normal mood and affect.  Nursing note and vitals reviewed.   ED Course  Procedures (including critical care time) Labs Review Labs Reviewed  CBC WITH DIFFERENTIAL/PLATELET - Abnormal; Notable for the following:    MCV 77.7 (*)    MCH 24.7 (*)    RDW 16.5 (*)    Eosinophils Relative 7 (*)    All other components within normal limits  COMPREHENSIVE METABOLIC PANEL - Abnormal; Notable for the following:    Chloride 99 (*)    Glucose, Bld 121 (*)    Calcium 8.8  (*)    Albumin 3.2 (*)    ALT 9 (*)    All other components within normal limits  TSH  I-STAT TROPOININ, ED    Imaging Review No results found. I have personally reviewed and evaluated these images and lab results as part of my medical decision-making.   EKG Interpretation   Date/Time:  Tuesday September 23 2014 16:58:26 EDT Ventricular Rate:  119 PR Interval:  136 QRS Duration: 80 QT Interval:  351 QTC Calculation: 494 R Axis:   0 Text Interpretation:  Fast sinus arrhythmia Borderline prolonged QT  interval Baseline wander in lead(s) V6 Similar to prior Had SVT earlier  Confirmed by Mingo Amber  MD, [place] ([id number]) on 09/23/2014 11:27:36 PM     Patient seen by [name] Einar Gip).  After verapamil in ED, SVT has resolved.  Plan to discharge home with atenolol 100 mg bid and verapamil SR 120 mg q day.  He will follow-up with the patient in the office. Return precautions discussed with patient. MDM   Final diagnoses:  None    SVT.    Felicia Quill, NP 09/23/14 [place], MD 09/23/14 [phone]

## 2014-09-23 NOTE — Progress Notes (Signed)
  Subjective:  Patient ID: Felicia [name], female    DOB: 03-24-1970  Age: 44 y.o. MRN: [id number]  44 year old lady who had an episode today of dizziness and heart racing when she stood up. She was lightheaded. She came to urgent medical family care. When she came in her initial heart rate was 181, down to 158 when Bush, PA-C examined her. EKG was done and showed an irregular rhythm. I was asked to see her also. EMS had been called. She has been out of her heart/blood pressure medicine  She has a history of interstitial lung disease and is followed by pulmonologist.   Objective:   Chest clear. Heart irregular. No murmurs noted.  EKG is a small right than when she came in. Right is 113 on the EKG. The rhythm is irregularly irregular but P waves are present consistently. The P waves are multifocal. There is no flutter type rhythm noted. This appears to be a multifocal nature tachycardia  Assessment & Plan:   Assessment:  Multifocal atrial tachycardia Dizziness and lightheadedness History of interstitial lung disease Medicine noncompliance  Plan:  Send her to the hospital for observation and evaluation and to get her back on her medications.  Discussed with Bennett Scrape, PA-C. IV is being started. EMS arrived and will take her to the hospital.  Willian Donson, MD 09/23/2014

## 2014-09-23 NOTE — Discharge Instructions (Signed)
Supraventricular Tachycardia Supraventricular tachycardia (SVT) is an abnormal heart rhythm (arrhythmia) that causes the heart to beat very fast (tachycardia). This kind of fast heartbeat originates in the upper chambers of the heart (atria). SVT can cause the heart to beat greater than 100 beats per minute. SVT can have a rapid burst of heartbeats. This can start and stop suddenly without warning and is called nonsustained. SVT can also be sustained, in which the heart beats at a continuous fast rate.  CAUSES  There can be different causes of SVT. Some of these include:  Heart valve problems such as mitral valve prolapse.  An enlarged heart (hypertrophic cardiomyopathy).  Congenital heart problems.  Heart inflammation (pericarditis).  Hyperthyroidism.  Low potassium or magnesium levels.  Caffeine.  Drug use such as cocaine, methamphetamines, or stimulants.  Some over-the-counter medicines such as:  Decongestants.  Diet medicines.  Herbal medicines. SYMPTOMS  Symptoms of SVT can vary. Symptoms depend on whether the SVT is sustained or nonsustained. You may experience:  No symptoms (asymptomatic).  An awareness of your heart beating rapidly (palpitations).  Shortness of breath.  Chest pain or pressure. If your blood pressure drops because of the SVT, you may experience:  Fainting or near fainting.  Weakness.  Dizziness. DIAGNOSIS  Different tests can be performed to diagnose SVT, such as:  An electrocardiogram (EKG). This is a painless test that records the electrical activity of your heart.  Holter monitor. This is a 24 hour recording of your heart rhythm. You will be given a diary. Write down all symptoms that you have and what you were doing at the time you experienced symptoms.  Arrhythmia monitor. This is a small device that your wear for several weeks. It records the heart rhythm when you have symptoms.  Echocardiogram. This is an imaging test to help detect  abnormal heart structure such as congenital abnormalities, heart valve problems, or heart enlargement.  Stress test. This test can help determine if the SVT is related to exercise.  Electrophysiology study (EPS). This is a procedure that evaluates your heart's electrical system and can help your caregiver find the cause of your SVT. TREATMENT  Treatment of SVT depends on the symptoms, how often it recurs, and whether there are any underlying heart problems.   If symptoms are rare and no other cardiac disease is present, no treatment may be needed.  Blood work may be done to check potassium, magnesium, and thyroid hormone levels to see if they are abnormal. If these levels are abnormal, treatment to correct the problems will occur. Medicines Your caregiver may use oral medicines to treat SVT. These medicines are given for long-term control of SVT. Medicines may be used alone or in combination with other treatments. These medicines work to slow nerve impulses in the heart muscle. These medicines can also be used to treat high blood pressure. Some of these medicines may include:  Calcium channel blockers.  Beta blockers.  Digoxin. Nonsurgical procedures Nonsurgical techniques may be used if oral medicines do not work. Some examples include:  Cardioversion. This technique uses either drugs or an electrical shock to restore a normal heart rhythm.  Cardioversion drugs may be given through an intravenous (IV) line to help "reset" the heart rhythm.  In electrical cardioversion, the caregiver shocks your heart to stop its beat for a split second. This helps to reset the heart to a normal rhythm.  Ablation. This procedure is done under mild sedation. High frequency radio wave energy is used to  destroy the area of heart tissue responsible for the SVT. HOME CARE INSTRUCTIONS   Do not smoke.  Only take medicines prescribed by your caregiver. Check with your caregiver before using over-the-counter  medicines.  Check with your caregiver about how much alcohol and caffeine (coffee, tea, colas, or chocolate) you may have.  It is very important to keep all follow-up referrals and appointments in order to properly manage this problem. SEEK IMMEDIATE MEDICAL CARE IF:  You have dizziness.  You faint or nearly faint.  You have shortness of breath.  You have chest pain or pressure.  You have sudden nausea or vomiting.  You have profuse sweating.  You are concerned about how long your symptoms last.  You are concerned about the frequency of your SVT episodes. If you have the above symptoms, call your local emergency services (911 in [place] immediately. Do not drive yourself to the hospital. MAKE SURE YOU:   Understand these instructions.  Will watch your condition.  Will get help right away if you are not doing well or get worse. Document Released: 01/24/2005 Document Revised: 04/18/2011 Document Reviewed: 05/08/2008 [facility] Patient Information 2015 [place], [place]. This information is not intended to replace advice given to you by your health care provider. Make sure you discuss any questions you have with your health care provider.

## 2014-09-23 NOTE — ED Notes (Signed)
Per EMS- pt felt dizzy and short of breathe with exertion only so she went to [place]. PT is having runs of SVT in the 180s. Adenosine X 3 6, 12, 12. HR 110 now.

## 2014-09-23 NOTE — H&P (Signed)
Felicia [name] is an 44 y.o. female.   Chief Complaint: Palpitations HPI: Felicia [name]  is a 44 y.o. female  With history of interstitial lung disease/pneumonitis, hypertension, otherwise no other significant cardiovascular history, patient doing well until yesterday afternoon suddenly had rapid heartbeat that lasted for about 45 minutes. It spontaneously subsided. She was doing well when she woke up this morning. Around 1240 12:55 PM today she suddenly started to feel rapid heartbeat again. She presented to the emergency room after she presented to urgent care where she was found to be in PSVT. where she was transported by EMS. She received intravenous adenosine and she transiently converted to sinus rhythm but was back in SVT.  She converted back to sinus rhythm transiently with intravenous adenosine and but reverted back to SVT. I was called to see the patient for further management.  She does not smoke, drinks alcohol occasionally. She has lost about 30 pounds in weight with strict exercise recently. She is to follow-up with Mrs. Eloise Levels, [facility] but has not been able to see her back.  Patient was on atenolol for hypertension but stopped 3 days ago. Denies any excessive caffeine or call intake. No stimulant use.  Past Medical History  Diagnosis Date  . Hypertension     No past surgical history on file.  Family History  Problem Relation Age of Onset  . Hypertension Mother   . Cancer Father   . Diabetes Father   . Hypertension Father   . Diabetes Sister   . Mental retardation Sister   . Cancer Maternal Grandmother   . Cancer Maternal Grandfather   . Cancer Paternal Grandmother   . Cancer Paternal Grandfather    Social History:  has no tobacco, alcohol, and drug history on file.  Allergies: No Known Allergies  Review of Systems - Negative except Chronic shortness of breath on exertion, natural oxygen supplementation, hypertension and obesity.  Blood pressure 128/93,  pulse 106, resp. rate 43, weight 100.245 kg (221 lb), SpO2 98 %.  Body mass index is 36.38 kg/(m^2).  General appearance: alert, cooperative, appears older than stated age, no distress and moderately obese Eyes: negative findings: lids and lashes normal Neck: no carotid bruit, no JVD, supple, symmetrical, trachea midline and thyroid not enlarged, symmetric, no tenderness/mass/nodules Neck: JVP - normal, carotids 2+= without bruits Resp: Diffuse coarse crackles bilateral Chest wall: no tenderness Cardio: Tachycardia present. No gallop or murmur appreciated. GI: soft, non-tender; bowel sounds normal; no masses,  no organomegaly and obese Extremities: extremities normal, atraumatic, no cyanosis or edema Pulses: 2+ and symmetric Skin: Skin color, texture, turgor normal. No rashes or lesions Neurologic: Grossly normal  Results for orders placed or performed during the hospital encounter of 09/23/14 (from the past 48 hour(s))  CBC with Differential/Platelet     Status: Abnormal   Collection Time: 09/23/14  5:15 PM  Result Value Ref Range   WBC 4.8 4.0 - 10.5 K/uL   RBC 5.07 3.87 - 5.11 MIL/uL   Hemoglobin 12.5 12.0 - 15.0 g/dL   HCT 39.4 36.0 - 46.0 %   MCV 77.7 (L) 78.0 - 100.0 fL   MCH 24.7 (L) 26.0 - 34.0 pg   MCHC 31.7 30.0 - 36.0 g/dL   RDW 16.5 (H) 11.5 - 15.5 %   Platelets 345 150 - 400 K/uL   Neutrophils Relative % 44 43 - 77 %   Neutro Abs 2.1 1.7 - 7.7 K/uL   Lymphocytes Relative 37 12 - 46 %  Lymphs Abs 1.8 0.7 - 4.0 K/uL   Monocytes Relative 11 3 - 12 %   Monocytes Absolute 0.5 0.1 - 1.0 K/uL   Eosinophils Relative 7 (H) 0 - 5 %   Eosinophils Absolute 0.3 0.0 - 0.7 K/uL   Basophils Relative 1 0 - 1 %   Basophils Absolute 0.1 0.0 - 0.1 K/uL   No results found.  Labs:   Lab Results  Component Value Date   WBC 4.8 09/23/2014   HGB 12.5 09/23/2014   HCT 39.4 09/23/2014   MCV 77.7* 09/23/2014   PLT 345 09/23/2014   EKG:  Telemetry rhythm strips reveal SVT at a  rate of 180-210 bpm, converted to sinus rhythm with intravenous adenosine, but reverted back to SVT. Repeat EKG after she converted to sinus rhythm reveals normal sinus rhythm/sinus tachycardia.   Current facility-administered medications:  .  adenosine (ADENOCARD) 6 MG/2ML injection 12 mg, 12 mg, Intravenous, Once, Etta Quill, NP .  adenosine (ADENOCARD) 6 MG/2ML injection, , , ,  .  verapamil (CALAN-SR) CR tablet 180 mg, 180 mg, Oral, Daily, Adrian Prows, MD .  verapamil (ISOPTIN) injection 5 mg, 5 mg, Intravenous, Once, Adrian Prows, MD  Current outpatient prescriptions:  .  atenolol (TENORMIN) 100 MG tablet, Take 100 mg by mouth daily., Disp: , Rfl:  .  atenolol (TENORMIN) 25 MG tablet, Take by mouth daily., Disp: , Rfl:  .  guaiFENesin (MUCINEX) 600 MG 12 hr tablet, Take 600 mg by mouth daily as needed for to loosen phlegm., Disp: , Rfl:  .  hydrochlorothiazide (HYDRODIURIL) 25 MG tablet, Take 25 mg by mouth daily., Disp: , Rfl:  .  ibuprofen (ADVIL,MOTRIN) 200 MG tablet, Take 800 mg by mouth daily as needed for moderate pain., Disp: , Rfl:  .  mycophenolate (CELLCEPT) 500 MG tablet, Take by mouth 2 (two) times daily., Disp: , Rfl:  .  predniSONE (DELTASONE) 10 MG tablet, Take 10 mg by mouth daily with breakfast., Disp: , Rfl:  .  sulfamethoxazole-trimethoprim (BACTRIM DS,SEPTRA DS) 800-160 MG per tablet, Take 1 tablet by mouth every Monday, Wednesday, and Friday. , Disp: , Rfl:  .  Sulfamethoxazole-Trimethoprim (BACTRIM PO), Take by mouth., Disp: , Rfl:   Assessment/Plan 1. PSVT, successfully converted with intravenous adenosine but reverted back to SVT. Converted to sinus rhythm/sinus tachycardia at the rate of 109 bpm with intravenous verapamil. 2. Hypertension 3. Moderate obesity 4. Interstitial lung disease, being followed at [facility] and being treated with CellCept.  Recommendation: Patient can be discharged home on atenolol 100 mg by mouth twice a day and  Verapamil SR 120 mg by mouth daily. I will see her back in the office for further management of hypertension and SVT. If she has recurrent episodes of SVT, we will then consider EP evaluation/ablation. I have discussed with the patient regarding Valsalva maneuver and ways in which she could potentially terminate SVT.   Adrian Prows, MD 09/23/2014, 5:34 PM [facility] [place] Pager: [phone] Office: [phone] If no answer: Cell:  [phone]

## 2014-09-23 NOTE — ED Notes (Signed)
Dr Nadyne Coombes with cards is at bedside recommending giving 5 mg verapimil IV followed by 12 adenosine.

## 2014-09-23 NOTE — ED Notes (Signed)
Upon standing to use restroom, pt O2 sats dropped to 75% on RA and HR @ 175. Pt back in bed, O2 bumped up to 6L, sats increased to  100%, O2 turned back to 3L and pt resting comfortably now.

## 2014-09-23 NOTE — ED Notes (Signed)
Dr. Nadyne Coombes recommending giving additional 5 mg verapimil followed by 180 mg verapimil SR po.

## 2014-11-29 ENCOUNTER — Other Ambulatory Visit: Payer: Self-pay | Admitting: Family

## 2014-11-29 DIAGNOSIS — Z1231 Encounter for screening mammogram for malignant neoplasm of breast: Secondary | ICD-10-CM

## 2014-12-23 ENCOUNTER — Other Ambulatory Visit: Payer: Self-pay

## 2014-12-23 DIAGNOSIS — Z1231 Encounter for screening mammogram for malignant neoplasm of breast: Secondary | ICD-10-CM

## 2015-01-16 ENCOUNTER — Ambulatory Visit: Payer: 59

## 2016-02-15 ENCOUNTER — Institutional Professional Consult (permissible substitution): Payer: 59 | Admitting: Pulmonary Disease

## 2016-03-30 ENCOUNTER — Institutional Professional Consult (permissible substitution): Payer: 59 | Admitting: Pulmonary Disease

## 2016-05-30 ENCOUNTER — Encounter (HOSPITAL_COMMUNITY)
Admission: RE | Admit: 2016-05-30 | Discharge: 2016-05-30 | Disposition: A | Discharge status: Home / Self Care | Payer: 59 | Source: Ambulatory Visit | Attending: Pulmonary Disease | Admitting: Pulmonary Disease

## 2016-05-30 ENCOUNTER — Encounter (HOSPITAL_COMMUNITY): Payer: Self-pay

## 2016-05-30 VITALS — BP 131/75 | HR 80 | Ht 65.0 in | Wt 247.8 lb

## 2016-05-30 DIAGNOSIS — Z029 Encounter for administrative examinations, unspecified: Secondary | ICD-10-CM | POA: Insufficient documentation

## 2016-05-30 DIAGNOSIS — J84113 Idiopathic non-specific interstitial pneumonitis: Secondary | ICD-10-CM

## 2016-05-30 NOTE — Progress Notes (Signed)
Felicia [name] 46 y.o. female Pulmonary Rehab Orientation Note Patient arrived today in Cardiac and Pulmonary Rehab for orientation to Pulmonary Rehab. She was transported from [employer] via wheel chair. She does carry portable oxygen. Per pt, she uses oxygen continuously. Color good, skin warm and dry. Patient is oriented to time and place. Patient's medical history, psychosocial health, and medications reviewed. Psychosocial assessment reveals pt lives alone. Pt is currently full time job as a Sales promotion account executive of an [employer] and works from home. Pt hobbies include reading. Pt reports her stress level is low. Areas of stress/anxiety include Health Work.  Pt does not exhibit signs of depression.  PHQ2/9 score 0/0. Pt shows good  coping skills with positive outlook . Will continue to monitor to evaluate to see if psychosocial issues arise.  Presently she has no issues.. Physical assessment reveals heart rate is normal, breath sounds clear to auscultation, no wheezes, rales, or rhonchi. Grip strength equal, strong. Distal pulses 3+ . Patient reports she does take medications as prescribed. Patient states she follows a Regular diet. The patient reports no specific efforts to gain or lose weight.. Patient's weight will be monitored closely. Demonstration and practice of PLB using pulse oximeter. Patient able to return demonstration satisfactorily. Safety and hand hygiene in the exercise area reviewed with patient. Patient voices understanding of the information reviewed. Department expectations discussed with patient and achievable goals were set. The patient shows enthusiasm about attending the program and we look forward to working with this nice lady. The patient is scheduled for a 6 min walk test on Thursday, Jun 16, 2016 @ 3:30pm and to begin exercise on Thursday, Jun 23, 2016. I spent from 0940-1200 orienting patient to the program, documenting, and teaching patient what to expect from the  program.  She is on metformin and was given a free glucometer to check her CBG before and after exercising .  She did not have one of her own.  [id number]

## 2016-06-16 ENCOUNTER — Encounter (HOSPITAL_COMMUNITY)
Admission: RE | Admit: 2016-06-16 | Discharge: 2016-06-16 | Disposition: A | Discharge status: Home / Self Care | Payer: 59 | Source: Ambulatory Visit | Attending: Pulmonary Disease | Admitting: Pulmonary Disease

## 2016-06-16 DIAGNOSIS — Z029 Encounter for administrative examinations, unspecified: Secondary | ICD-10-CM | POA: Diagnosis not present

## 2016-06-16 DIAGNOSIS — J84113 Idiopathic non-specific interstitial pneumonitis: Secondary | ICD-10-CM

## 2016-06-17 NOTE — Progress Notes (Signed)
Pulmonary Individual Treatment Plan  Patient Details  Name: Felicia [name] MRN: [id number] Date of Birth: 11-04-1970 Referring Provider:     Pulmonary Rehab Walk Test from 06/16/2016 in [place]  Referring Provider  Dr. Nelda Marseille [Dr. [name] ([facility])]      Initial Encounter Date:    Pulmonary Rehab Walk Test from 06/16/2016 in [place]  Date  06/17/16  Referring Provider  Dr. Nelda Marseille [Dr. [name] ([facility])]      Visit Diagnosis: Idiopathic non-specific interstitial pneumonitis ([place])  Patient's Home Medications on Admission:   Current Outpatient Prescriptions:  .  albuterol (PROVENTIL HFA;VENTOLIN HFA) 108 (90 Base) MCG/ACT inhaler, Inhale 1 puff into the lungs every 6 (six) hours as needed for wheezing or shortness of breath., Disp: , Rfl:  .  atenolol (TENORMIN) 100 MG tablet, Take 1 tablet (100 mg total) by mouth daily., Disp: 60 tablet, Rfl: 0 .  guaiFENesin (MUCINEX) 600 MG 12 hr tablet, Take 600 mg by mouth daily as needed for to loosen phlegm., Disp: , Rfl:  .  hydrochlorothiazide (HYDRODIURIL) 25 MG tablet, Take 25 mg by mouth daily., Disp: , Rfl:  .  ibuprofen (ADVIL,MOTRIN) 200 MG tablet, Take 800 mg by mouth daily as needed for moderate pain., Disp: , Rfl:  .  metFORMIN (GLUCOPHAGE) 500 MG tablet, Take 500 mg by mouth once., Disp: , Rfl:  .  mycophenolate (CELLCEPT) 500 MG tablet, Take 500 mg by mouth 2 (two) times daily. 3 tablets (1500 mg) bid, Disp: , Rfl:  .  predniSONE (DELTASONE) 10 MG tablet, Take 10 mg by mouth daily with breakfast., Disp: , Rfl:  .  sulfamethoxazole-trimethoprim (BACTRIM DS,SEPTRA DS) 800-160 MG per tablet, Take 1 tablet by mouth every Monday, Wednesday, and Friday. , Disp: , Rfl:  .  Sulfamethoxazole-Trimethoprim (BACTRIM PO), Take by mouth., Disp: , Rfl:  .  verapamil (CALAN-SR) 120 MG CR tablet, Take 1 tablet (120 mg total) by mouth at bedtime., Disp: 30 tablet, Rfl:  0  Past Medical History: Past Medical History:  Diagnosis Date  . Hypertension     Tobacco Use: History  Smoking Status  . Never Smoker  Smokeless Tobacco  . Never Used    Comment: never smoked or used tobacco products    Labs: Recent Review Flowsheet Data    Labs for ITP Cardiac and Pulmonary Rehab Latest Ref Rng & Units 12/24/2008 12/27/2008   PHART 7.350 - 7.400 7.432(H) 7.366   PCO2ART 35.0 - 45.0 mmHg 40.2 45.6(H)   HCO3 20.0 - 24.0 mEq/L 26.3(H) 25.5(H)   TCO2 0 - 100 mmol/L 27.6 26.9   O2SAT % 93.4 97.5      Capillary Blood Glucose: Lab Results  Component Value Date   GLUCAP 92 12/30/2008   GLUCAP 169 (H) 12/29/2008   GLUCAP 210 (H) 12/29/2008   GLUCAP 167 (H) 12/29/2008   GLUCAP 110 (H) 12/29/2008     ADL UCSD:   Pulmonary Function Assessment:     Pulmonary Function Assessment - 05/30/16 1033      Breath   Bilateral Breath Sounds Clear   Shortness of Breath Yes;Limiting activity      Exercise Target Goals: Date: 06/17/16  Exercise Program Goal: Individual exercise prescription set with THRR, safety & activity barriers. Participant demonstrates ability to understand and report RPE using BORG scale, to self-measure pulse accurately, and to acknowledge the importance of the exercise prescription.  Exercise Prescription Goal: Starting with aerobic activity 30 plus minutes  a day, 3 days per week for initial exercise prescription. Provide home exercise prescription and guidelines that participant acknowledges understanding prior to discharge.  Activity Barriers & Risk Stratification:     Activity Barriers & Cardiac Risk Stratification - 05/30/16 1038      Activity Barriers & Cardiac Risk Stratification   Activity Barriers None      6 Minute Walk:     6 Minute Walk    Row Name 06/17/16 0922         6 Minute Walk   Phase Initial     Distance 610 feet     Walk Time -  4 minutes 55 seconds     # of Rest Breaks 2  1st: 45 seconds 2nd:  10 seconds     MPH 1.15     METS 1.84     RPE 12     Perceived Dyspnea  3     Symptoms No     Resting HR 84 bpm     Resting BP 138/88     Max Ex. HR 113 bpm     Max Ex. BP 142/90       Interval HR   Baseline HR 84     1 Minute HR 103     2 Minute HR 113     3 Minute HR 97     4 Minute HR 99     5 Minute HR 107     6 Minute HR 111     2 Minute Post HR 86     Interval Heart Rate? Yes       Interval Oxygen   Interval Oxygen? Yes     Baseline Oxygen Saturation % 99 %     Baseline Liters of Oxygen 15 L     1 Minute Oxygen Saturation % 92 %     1 Minute Liters of Oxygen 15 L     2 Minute Oxygen Saturation % 80 %     2 Minute Liters of Oxygen 15 L     3 Minute Oxygen Saturation % 98 %  break 2:05     3 Minute Liters of Oxygen 15 L     4 Minute Oxygen Saturation % 95 %     4 Minute Liters of Oxygen 15 L     5 Minute Oxygen Saturation % 88 %  break 5:40     5 Minute Liters of Oxygen 15 L     6 Minute Oxygen Saturation % 82 %     6 Minute Liters of Oxygen 15 L     2 Minute Post Oxygen Saturation % 94 %     2 Minute Post Liters of Oxygen 15 L        Oxygen Initial Assessment:     Oxygen Initial Assessment - 06/17/16 0920      Initial 6 min Walk   Oxygen Used Continuous;E-Tanks   Liters per minute 15   Resting Oxygen Saturation  during 6 min walk 99 %   Exercise Oxygen Saturation  during 6 min walk 80 %  rest break initiated by staff     Program Oxygen Prescription   Program Oxygen Prescription Continuous;E-Tanks   Liters per minute 15   Comments Patient will bring oximizer with her to rehab-this may help with discomfort of high liter flow and low oxygen saturations.      Oxygen Re-Evaluation:   Oxygen Discharge (Final Oxygen Re-Evaluation):   Initial Exercise Prescription:  Initial Exercise Prescription - 06/17/16 0900      Date of Initial Exercise RX and Referring Provider   Date 06/17/16   Referring Provider Dr. Nelda Marseille  Dr. [name] ([facility])      Oxygen   Oxygen Continuous   Liters 15     Recumbant Bike   Level 2   Minutes 15     NuStep   Level 2   Minutes 17   METs 1.5     Track   Laps 5   Minutes 17     Prescription Details   Frequency (times per week) 2   Duration Progress to 45 minutes of aerobic exercise without signs/symptoms of physical distress     Intensity   THRR 40-80% of Max Heartrate 70-139   Ratings of Perceived Exertion 11-13   Perceived Dyspnea 0-4     Progression   Progression Continue progressive overload as per policy without signs/symptoms or physical distress.     Resistance Training   Training Prescription Yes   Weight ORANGE BANDS   Reps 10-15      Perform Capillary Blood Glucose checks as needed.  Exercise Prescription Changes:   Exercise Comments:   Exercise Goals and Review:   Exercise Goals Re-Evaluation :   Discharge Exercise Prescription (Final Exercise Prescription Changes):   Nutrition:  Target Goals: Understanding of nutrition guidelines, daily intake of sodium <1518m, cholesterol <2073m calories 30% from fat and 7% or less from saturated fats, daily to have 5 or more servings of fruits and vegetables.  Biometrics:     Pre Biometrics - 05/30/16 1042      Pre Biometrics   Grip Strength 25 kg       Nutrition Therapy Plan and Nutrition Goals:   Nutrition Discharge: Rate Your Plate Scores:   Nutrition Goals Re-Evaluation:   Nutrition Goals Discharge (Final Nutrition Goals Re-Evaluation):   Psychosocial: Target Goals: Acknowledge presence or absence of significant depression and/or stress, maximize coping skills, provide positive support system. Participant is able to verbalize types and ability to use techniques and skills needed for reducing stress and depression.  Initial Review & Psychosocial Screening:     Initial Psych Review & Screening - 05/30/16 1046      Initial Review   Current issues with None Identified     Family Dynamics    Good Support System? Yes     Barriers   Psychosocial barriers to participate in program There are no identifiable barriers or psychosocial needs.     Screening Interventions   Interventions Encouraged to exercise      Quality of Life Scores:   PHQ-9: Recent Review Flowsheet Data    [name] 05/30/2016 09/23/2014   Decreased Interest 0 0   Down, Depressed, Hopeless 0 0   PHQ - 2 Score 0 0     Interpretation of Total Score  Total Score Depression Severity:  1-4 = Minimal depression, 5-9 = Mild depression, 10-14 = Moderate depression, 15-19 = Moderately severe depression, 20-27 = Severe depression   Psychosocial Evaluation and Intervention:     Psychosocial Evaluation - 05/30/16 1047      Psychosocial Evaluation & Interventions   Interventions Encouraged to exercise with the program and follow exercise prescription   Continue Psychosocial Services  No Follow up required      Psychosocial Re-Evaluation:   Psychosocial Discharge (Final Psychosocial Re-Evaluation):   Education: Education Goals: Education classes will be provided on a weekly basis, covering required topics. Participant  will state understanding/return demonstration of topics presented.  Learning Barriers/Preferences:     Learning Barriers/Preferences - 05/30/16 1032      Learning Barriers/Preferences   Learning Barriers None   Learning Preferences Pictoral;Video      Education Topics: Risk Factor Reduction:  -Group instruction that is supported by a PowerPoint presentation. Instructor discusses the definition of a risk factor, different risk factors for pulmonary disease, and how the heart and lungs work together.     Nutrition for Pulmonary Patient:  -Group instruction provided by PowerPoint slides, verbal discussion, and written materials to support subject matter. The instructor gives an explanation and review of healthy diet recommendations, which includes a discussion on  weight management, recommendations for fruit and vegetable consumption, as well as protein, fluid, caffeine, fiber, sodium, sugar, and alcohol. Tips for eating when patients are short of breath are discussed.   Pursed Lip Breathing:  -Group instruction that is supported by demonstration and informational handouts. Instructor discusses the benefits of pursed lip and diaphragmatic breathing and detailed demonstration on how to preform both.     Oxygen Safety:  -Group instruction provided by PowerPoint, verbal discussion, and written material to support subject matter. There is an overview of "What is Oxygen" and "Why do we need it".  Instructor also reviews how to create a safe environment for oxygen use, the importance of using oxygen as prescribed, and the risks of noncompliance. There is a brief discussion on traveling with oxygen and resources the patient may utilize.   Oxygen Equipment:  -Group instruction provided by [facility] Staff utilizing handouts, written materials, and equipment demonstrations.   Signs and Symptoms:  -Group instruction provided by written material and verbal discussion to support subject matter. Warning signs and symptoms of infection, stroke, and heart attack are reviewed and when to call the physician/911 reinforced. Tips for preventing the spread of infection discussed.   Advanced Directives:  -Group instruction provided by verbal instruction and written material to support subject matter. Instructor reviews Advanced Directive laws and proper instruction for filling out document.   Pulmonary Video:  -Group video education that reviews the importance of medication and oxygen compliance, exercise, good nutrition, pulmonary hygiene, and pursed lip and diaphragmatic breathing for the pulmonary patient.   Exercise for the Pulmonary Patient:  -Group instruction that is supported by a PowerPoint presentation. Instructor discusses benefits of exercise, core  components of exercise, frequency, duration, and intensity of an exercise routine, importance of utilizing pulse oximetry during exercise, safety while exercising, and options of places to exercise outside of rehab.     Pulmonary Medications:  -Verbally interactive group education provided by instructor with focus on inhaled medications and proper administration.   Anatomy and Physiology of the Respiratory System and Intimacy:  -Group instruction provided by PowerPoint, verbal discussion, and written material to support subject matter. Instructor reviews respiratory cycle and anatomical components of the respiratory system and their functions. Instructor also reviews differences in obstructive and restrictive respiratory diseases with examples of each. Intimacy, Sex, and Sexuality differences are reviewed with a discussion on how relationships can change when diagnosed with pulmonary disease. Common sexual concerns are reviewed.   Knowledge Questionnaire Score:   Core Components/Risk Factors/Patient Goals at Admission:     Personal Goals and Risk Factors at Admission - 05/30/16 1043      Core Components/Risk Factors/Patient Goals on Admission    Weight Management Weight Loss;Obesity   Improve shortness of breath with ADL's Yes   Intervention Provide education, individualized  exercise plan and daily activity instruction to help decrease symptoms of SOB with activities of daily living.   Expected Outcomes Short Term: Achieves a reduction of symptoms when performing activities of daily living.   Increase knowledge of respiratory medications and ability to use respiratory devices properly  Yes   Intervention Provide education and demonstration as needed of appropriate use of medications, inhalers, and oxygen therapy.   Expected Outcomes Short Term: Achieves understanding of medications use. Understands that oxygen is a medication prescribed by physician. Demonstrates appropriate use of inhaler  and oxygen therapy.      Core Components/Risk Factors/Patient Goals Review:    Core Components/Risk Factors/Patient Goals at Discharge (Final Review):    ITP Comments:   Comments:

## 2016-06-23 ENCOUNTER — Encounter (HOSPITAL_COMMUNITY)
Admission: RE | Admit: 2016-06-23 | Discharge: 2016-06-23 | Disposition: A | Discharge status: Home / Self Care | Payer: 59 | Source: Ambulatory Visit | Attending: Pulmonary Disease | Admitting: Pulmonary Disease

## 2016-06-23 VITALS — Wt 246.9 lb

## 2016-06-23 DIAGNOSIS — Z029 Encounter for administrative examinations, unspecified: Secondary | ICD-10-CM | POA: Diagnosis not present

## 2016-06-23 DIAGNOSIS — J84113 Idiopathic non-specific interstitial pneumonitis: Secondary | ICD-10-CM

## 2016-06-23 LAB — GLUCOSE, CAPILLARY
GLUCOSE-CAPILLARY: 150 mg/dL — AB (ref 65–99)
Glucose-Capillary: 113 mg/dL — ABNORMAL HIGH (ref 65–99)

## 2016-06-23 NOTE — Progress Notes (Signed)
Daily Session Note  Patient Details  Name: Felicia [name] MRN: [id number] Date of Birth: 1971-02-05 Referring Provider:     Pulmonary Rehab Walk Test from 06/16/2016 in [place]  Referring Provider  Dr. Nelda Marseille [Dr. [name] ([facility])]      Encounter Date: 06/23/2016  Check In:     Session Check In - 06/23/16 1054      Check-In   Location [facility]   Staff Present Rosebud Poles, RN, BSN;Marelin Tat, MS, ACSM RCEP, Exercise Physiologist;Lisa Ysidro Evert, RN   Supervising physician immediately available to respond to emergencies [facility] immediately available   Physician(s) Dr. Broadus John   Medication changes reported     No   Fall or balance concerns reported    No   Tobacco Cessation No Change   Warm-up and Cool-down Performed as group-led instruction   Resistance Training Performed Yes   VAD Patient? No     Pain Assessment   Currently in Pain? No/denies   Multiple Pain Sites No      Capillary Blood Glucose: Results for orders placed or performed during the hospital encounter of 06/23/16 (from the past 24 hour(s))  Glucose, capillary     Status: Abnormal   Collection Time: 06/23/16 11:22 AM  Result Value Ref Range   Glucose-Capillary 150 (H) 65 - 99 mg/dL  Glucose, capillary     Status: Abnormal   Collection Time: 06/23/16 12:01 PM  Result Value Ref Range   Glucose-Capillary 113 (H) 65 - 99 mg/dL        Exercise Prescription Changes - 06/23/16 1200      Response to Exercise   Blood Pressure (Admit) 106/80   Blood Pressure (Exercise) 110/70   Blood Pressure (Exit) 124/94   Heart Rate (Admit) 105 bpm   Heart Rate (Exercise) 118 bpm   Heart Rate (Exit) 86 bpm   Oxygen Saturation (Admit) 88 %   Oxygen Saturation (Exercise) 88 %   Oxygen Saturation (Exit) 100 %   Rating of Perceived Exertion (Exercise) 12   Perceived Dyspnea (Exercise) 0.5   Duration Progress to 45 minutes of aerobic exercise without  signs/symptoms of physical distress   Intensity THRR unchanged     Progression   Progression Continue to progress workloads to maintain intensity without signs/symptoms of physical distress.     Resistance Training   Training Prescription Yes   Weight ORANGE BANDS   Reps 10-15     Oxygen   Oxygen Continuous   Liters 15     Recumbant Bike   Level 2   Minutes 15     NuStep   Level 2   Minutes 17   METs 1.5      History  Smoking Status  . Never Smoker  Smokeless Tobacco  . Never Used    Comment: never smoked or used tobacco products    Goals Met:  Exercise tolerated well No report of cardiac concerns or symptoms Strength training completed today  Goals Unmet:  Not Applicable  Comments: Service time is from 10:30A to 12:20P    Dr. Rush Farmer is Medical Director for Pulmonary Rehab at [name].

## 2016-06-28 ENCOUNTER — Encounter (HOSPITAL_COMMUNITY)
Admission: RE | Admit: 2016-06-28 | Discharge: 2016-06-28 | Disposition: A | Discharge status: Home / Self Care | Payer: 59 | Source: Ambulatory Visit | Attending: Pulmonary Disease | Admitting: Pulmonary Disease

## 2016-06-28 ENCOUNTER — Encounter (HOSPITAL_COMMUNITY): Payer: 59

## 2016-06-28 VITALS — Wt 244.0 lb

## 2016-06-28 DIAGNOSIS — Z029 Encounter for administrative examinations, unspecified: Secondary | ICD-10-CM | POA: Diagnosis not present

## 2016-06-28 DIAGNOSIS — J84113 Idiopathic non-specific interstitial pneumonitis: Secondary | ICD-10-CM

## 2016-06-28 NOTE — Progress Notes (Signed)
Daily Session Note  Patient Details  Name: Felicia [name] MRN: [id number] Date of Birth: 1970/11/14 Referring Provider:     Pulmonary Rehab Walk Test from 06/16/2016 in [place]  Referring Provider  Dr. Nelda Marseille [Dr. [name] ([facility])]      Encounter Date: 06/28/2016  Check In:     Session Check In - 06/28/16 1207      Check-In   Location [facility]   Staff Present Su Hilt, MS, ACSM RCEP, Exercise Physiologist;Joan Leonia Reeves, RN, BSN;Lisa Ysidro Evert, RN;Portia Rollene Rotunda, RN, BSN   Supervising physician immediately available to respond to emergencies [facility] immediately available   Physician(s) Dr. Broadus John   Medication changes reported     No   Fall or balance concerns reported    No   Tobacco Cessation No Change   Warm-up and Cool-down Performed as group-led instruction   Resistance Training Performed Yes   VAD Patient? No     Pain Assessment   Currently in Pain? No/denies   Multiple Pain Sites No      Capillary Blood Glucose: No results found for this or any previous visit (from the past 24 hour(s)).      Exercise Prescription Changes - 06/28/16 1200      Response to Exercise   Blood Pressure (Admit) 115/77   Blood Pressure (Exercise) 126/80   Blood Pressure (Exit) 108/70   Heart Rate (Admit) 84 bpm   Heart Rate (Exercise) 95 bpm   Heart Rate (Exit) 82 bpm   Oxygen Saturation (Admit) 100 %   Oxygen Saturation (Exercise) 85 %   Oxygen Saturation (Exit) 99 %   Rating of Perceived Exertion (Exercise) 12   Perceived Dyspnea (Exercise) 3   Duration Progress to 45 minutes of aerobic exercise without signs/symptoms of physical distress   Intensity THRR unchanged     Progression   Progression Continue to progress workloads to maintain intensity without signs/symptoms of physical distress.     Resistance Training   Training Prescription Yes   Weight ORANGE BANDS   Reps 10-15     Oxygen   Oxygen  Continuous   Liters 15     Recumbant Bike   Level 2   Minutes 17     NuStep   Level 2   Minutes 17   METs 1.4     Track   Laps 4   Minutes 17      History  Smoking Status  . Never Smoker  Smokeless Tobacco  . Never Used    Comment: never smoked or used tobacco products    Goals Met:  Exercise tolerated well No report of cardiac concerns or symptoms Strength training completed today  Goals Unmet:  Not Applicable  Comments: Service time is from 10:30a to 12:04p    Dr. Rush Farmer is Medical Director for Pulmonary Rehab at [facility].

## 2016-06-30 ENCOUNTER — Encounter (HOSPITAL_COMMUNITY)
Admission: RE | Admit: 2016-06-30 | Discharge: 2016-06-30 | Disposition: A | Discharge status: Home / Self Care | Payer: 59 | Source: Ambulatory Visit | Attending: Pulmonary Disease | Admitting: Pulmonary Disease

## 2016-06-30 VITALS — Wt 242.3 lb

## 2016-06-30 DIAGNOSIS — J84113 Idiopathic non-specific interstitial pneumonitis: Secondary | ICD-10-CM

## 2016-06-30 DIAGNOSIS — Z029 Encounter for administrative examinations, unspecified: Secondary | ICD-10-CM | POA: Diagnosis not present

## 2016-06-30 NOTE — Progress Notes (Signed)
Daily Session Note  Patient Details  Name: Edra Riccardi MRN: [id number] Date of Birth: 05-18-1970 Referring Provider:     Pulmonary Rehab Walk Test from 06/16/2016 in [place]  Referring Provider  Dr. Nelda Marseille [Dr. [name] ([facility])]      Encounter Date: 06/30/2016  Check In:     Session Check In - 06/30/16 1030      Check-In   Location [facility]   Staff Present Rosebud Poles, RN, BSN;Ramon Dredge, RN, MHA;Portia Rollene Rotunda, RN, BSN   Supervising physician immediately available to respond to emergencies [facility] immediately available   Physician(s) Dr. Karleen Hampshire   Medication changes reported     No   Fall or balance concerns reported    No   Tobacco Cessation No Change   Warm-up and Cool-down Performed as group-led instruction   Resistance Training Performed Yes   VAD Patient? No     Pain Assessment   Currently in Pain? No/denies   Multiple Pain Sites No      Capillary Blood Glucose: No results found for this or any previous visit (from the past 24 hour(s)).     POCT Glucose - 06/30/16 1232      POCT Blood Glucose   Pre-Exercise 120 mg/dL   Post-Exercise --  unable to get blood, given graham cracker and peanut butter given         Exercise Prescription Changes - 06/30/16 1200      Response to Exercise   Blood Pressure (Admit) 120/80   Blood Pressure (Exercise) 140/70   Blood Pressure (Exit) 118/54   Heart Rate (Admit) 80 bpm   Heart Rate (Exercise) 86 bpm   Heart Rate (Exit) 77 bpm   Oxygen Saturation (Admit) 100 %   Oxygen Saturation (Exercise) 83 %   Oxygen Saturation (Exit) 99 %   Rating of Perceived Exertion (Exercise) 12   Perceived Dyspnea (Exercise) 1   Duration Progress to 45 minutes of aerobic exercise without signs/symptoms of physical distress   Intensity THRR unchanged     Progression   Progression Continue to progress workloads to maintain intensity without signs/symptoms of  physical distress.     Resistance Training   Training Prescription Yes   Weight ORANGE BANDS   Reps 10-15   Time 10 Minutes  10 minutes     Interval Training   Interval Training No     Oxygen   Oxygen Continuous   Liters 15-25 L     Recumbant Bike   Level 3   Minutes 17     NuStep   Level 2   Minutes 17   METs 1.6      History  Smoking Status  . Never Smoker  Smokeless Tobacco  . Never Used    Comment: never smoked or used tobacco products    Goals Met:  Exercise tolerated well Strength training completed today  Goals Unmet:  Not Applicable  Comments: Service time is from 1030 to 1215    Dr. Rush Farmer is Medical Director for Pulmonary Rehab at [facility].

## 2016-07-05 ENCOUNTER — Encounter (HOSPITAL_COMMUNITY)
Admission: RE | Admit: 2016-07-05 | Discharge: 2016-07-05 | Disposition: A | Discharge status: Home / Self Care | Payer: 59 | Source: Ambulatory Visit | Attending: Pulmonary Disease | Admitting: Pulmonary Disease

## 2016-07-05 VITALS — Wt 242.9 lb

## 2016-07-05 DIAGNOSIS — Z029 Encounter for administrative examinations, unspecified: Secondary | ICD-10-CM | POA: Diagnosis not present

## 2016-07-05 DIAGNOSIS — J84113 Idiopathic non-specific interstitial pneumonitis: Secondary | ICD-10-CM

## 2016-07-05 NOTE — Progress Notes (Signed)
Daily Session Note  Patient Details  Name: Felicia [name] MRN: [id number] Date of Birth: 1970-09-18 Referring Provider:     Pulmonary Rehab Walk Test from 06/16/2016 in [place]  Referring Provider  Dr. Nelda Marseille [Dr. [name] ([facility])]      Encounter Date: 07/05/2016  Check In:     Session Check In - 07/05/16 1024      Check-In   Location [facility]   Staff Present Su Hilt, MS, ACSM RCEP, Exercise Physiologist;Portia Rollene Rotunda, RN, Maxcine Ham, RN, BSN   Supervising physician immediately available to respond to emergencies [facility] immediately available   Physician(s) Dr. Karleen Hampshire   Medication changes reported     No   Fall or balance concerns reported    No   Tobacco Cessation No Change   Warm-up and Cool-down Performed as group-led instruction   Resistance Training Performed Yes   VAD Patient? No     Pain Assessment   Currently in Pain? No/denies   Multiple Pain Sites No      Capillary Blood Glucose: No results found for this or any previous visit (from the past 24 hour(s)).      Exercise Prescription Changes - 07/05/16 1200      Response to Exercise   Blood Pressure (Admit) 116/82   Blood Pressure (Exercise) 130/86   Blood Pressure (Exit) 126/70   Heart Rate (Admit) 89 bpm   Heart Rate (Exercise) 101 bpm   Heart Rate (Exit) 85 bpm   Oxygen Saturation (Admit) 98 %   Oxygen Saturation (Exercise) 84 %   Oxygen Saturation (Exit) 99 %   Rating of Perceived Exertion (Exercise) 13   Perceived Dyspnea (Exercise) 4   Duration Progress to 45 minutes of aerobic exercise without signs/symptoms of physical distress   Intensity THRR unchanged     Progression   Progression Continue to progress workloads to maintain intensity without signs/symptoms of physical distress.     Resistance Training   Training Prescription Yes   Weight ORANGE BANDS   Reps 10-15   Time 10 Minutes     Interval Training    Interval Training No     Oxygen   Oxygen Continuous   Liters 15-25 L     Recumbant Bike   Level 2   Minutes 17     NuStep   Level 3   Minutes 17   METs 1.6     Track   Laps 6   Minutes 17      History  Smoking Status  . Never Smoker  Smokeless Tobacco  . Never Used    Comment: never smoked or used tobacco products    Goals Met:  Exercise tolerated well Strength training completed today  Goals Unmet:  Not Applicable  Comments: Service time is from 1030 to 1205   Dr. Rush Farmer is Medical Director for Pulmonary Rehab at [facility].

## 2016-07-07 ENCOUNTER — Encounter (HOSPITAL_COMMUNITY)
Admission: RE | Admit: 2016-07-07 | Discharge: 2016-07-07 | Disposition: A | Discharge status: Home / Self Care | Payer: 59 | Source: Ambulatory Visit | Attending: Pulmonary Disease | Admitting: Pulmonary Disease

## 2016-07-07 VITALS — Wt 243.4 lb

## 2016-07-07 DIAGNOSIS — Z029 Encounter for administrative examinations, unspecified: Secondary | ICD-10-CM | POA: Diagnosis not present

## 2016-07-07 DIAGNOSIS — J84113 Idiopathic non-specific interstitial pneumonitis: Secondary | ICD-10-CM

## 2016-07-07 NOTE — Progress Notes (Signed)
Daily Session Note  Patient Details  Name: Felicia [name] MRN: [id number] Date of Birth: Jun 23, 1970 Referring Provider:     Pulmonary Rehab Walk Test from 06/16/2016 in [place]  Referring Provider  Dr. Nelda Marseille [Dr. [name] ([facility])]      Encounter Date: 07/07/2016  Check In:     Session Check In - 07/07/16 1019      Check-In   Location [facility]   Staff Present Su Hilt, MS, ACSM RCEP, Exercise Physiologist;Adela Esteban Rollene Rotunda, RN, BSN   Supervising physician immediately available to respond to emergencies [facility] immediately available   Physician(s) Dr. Broadus John   Medication changes reported     No   Fall or balance concerns reported    No   Tobacco Cessation No Change   Warm-up and Cool-down Performed as group-led instruction   Resistance Training Performed Yes   VAD Patient? No     Pain Assessment   Currently in Pain? No/denies   Multiple Pain Sites No      Capillary Blood Glucose: No results found for this or any previous visit (from the past 24 hour(s)).     POCT Glucose - 07/07/16 1244      POCT Blood Glucose   Pre-Exercise 109 mg/dL   Post-Exercise 134 mg/dL         Exercise Prescription Changes - 07/07/16 1242      Response to Exercise   Blood Pressure (Admit) 116/60   Blood Pressure (Exercise) 142/94   Blood Pressure (Exit) 114/74   Heart Rate (Admit) 78 bpm   Heart Rate (Exercise) 110 bpm   Heart Rate (Exit) 78 bpm   Oxygen Saturation (Admit) 100 %   Oxygen Saturation (Exercise) 79 %   Oxygen Saturation (Exit) 78 %   Rating of Perceived Exertion (Exercise) 13   Perceived Dyspnea (Exercise) 3   Duration Progress to 45 minutes of aerobic exercise without signs/symptoms of physical distress   Intensity THRR unchanged     Progression   Progression Continue to progress workloads to maintain intensity without signs/symptoms of physical distress.     Resistance Training   Training Prescription Yes   Weight ORANGE BANDS   Reps 10-15   Time 10 Minutes     Interval Training   Interval Training No     Oxygen   Oxygen Continuous   Liters 15-25 L     NuStep   Level 3   Minutes 17     Track   Laps 6   Minutes 17      History  Smoking Status  . Never Smoker  Smokeless Tobacco  . Never Used    Comment: never smoked or used tobacco products    Goals Met:  Independence with exercise equipment Improved SOB with ADL's Using PLB without cueing & demonstrates good technique Exercise tolerated well No report of cardiac concerns or symptoms Strength training completed today  Goals Unmet:  Not Applicable  Comments: Service time is from 1030 to 1220   Dr. Rush Farmer is Medical Director for Pulmonary Rehab at [facility].

## 2016-07-12 ENCOUNTER — Encounter (HOSPITAL_COMMUNITY)
Admission: RE | Admit: 2016-07-12 | Discharge: 2016-07-12 | Disposition: A | Discharge status: Home / Self Care | Payer: 59 | Source: Ambulatory Visit | Attending: Pulmonary Disease | Admitting: Pulmonary Disease

## 2016-07-12 DIAGNOSIS — Z79899 Other long term (current) drug therapy: Secondary | ICD-10-CM | POA: Insufficient documentation

## 2016-07-12 DIAGNOSIS — I10 Essential (primary) hypertension: Secondary | ICD-10-CM | POA: Diagnosis not present

## 2016-07-12 DIAGNOSIS — Z7984 Long term (current) use of oral hypoglycemic drugs: Secondary | ICD-10-CM | POA: Diagnosis not present

## 2016-07-12 DIAGNOSIS — J84113 Idiopathic non-specific interstitial pneumonitis: Secondary | ICD-10-CM | POA: Diagnosis present

## 2016-07-12 NOTE — Progress Notes (Signed)
Pulmonary Individual Treatment Plan  Patient Details  Name: Felicia [name] MRN: [id number] Date of Birth: 02-17-1970 Referring Provider:     Pulmonary Rehab Walk Test from 06/16/2016 in [place]  Referring Provider  Dr. Nelda Marseille [Dr. [name] ([facility])]      Initial Encounter Date:    Pulmonary Rehab Walk Test from 06/16/2016 in [place]  Date  06/17/16  Referring Provider  Dr. Nelda Marseille [Dr. [name] ([facility])]      Visit Diagnosis: Idiopathic non-specific interstitial pneumonitis ([place])  Patient's Home Medications on Admission:   Current Outpatient Prescriptions:  .  albuterol (PROVENTIL HFA;VENTOLIN HFA) 108 (90 Base) MCG/ACT inhaler, Inhale 1 puff into the lungs every 6 (six) hours as needed for wheezing or shortness of breath., Disp: , Rfl:  .  atenolol (TENORMIN) 100 MG tablet, Take 1 tablet (100 mg total) by mouth daily., Disp: 60 tablet, Rfl: 0 .  guaiFENesin (MUCINEX) 600 MG 12 hr tablet, Take 600 mg by mouth daily as needed for to loosen phlegm., Disp: , Rfl:  .  hydrochlorothiazide (HYDRODIURIL) 25 MG tablet, Take 25 mg by mouth daily., Disp: , Rfl:  .  ibuprofen (ADVIL,MOTRIN) 200 MG tablet, Take 800 mg by mouth daily as needed for moderate pain., Disp: , Rfl:  .  metFORMIN (GLUCOPHAGE) 500 MG tablet, Take 500 mg by mouth once., Disp: , Rfl:  .  mycophenolate (CELLCEPT) 500 MG tablet, Take 500 mg by mouth 2 (two) times daily. 3 tablets (1500 mg) bid, Disp: , Rfl:  .  predniSONE (DELTASONE) 10 MG tablet, Take 10 mg by mouth daily with breakfast., Disp: , Rfl:  .  sulfamethoxazole-trimethoprim (BACTRIM DS,SEPTRA DS) 800-160 MG per tablet, Take 1 tablet by mouth every Monday, Wednesday, and Friday. , Disp: , Rfl:  .  Sulfamethoxazole-Trimethoprim (BACTRIM PO), Take by mouth., Disp: , Rfl:  .  verapamil (CALAN-SR) 120 MG CR tablet, Take 1 tablet (120 mg total) by mouth at bedtime., Disp: 30 tablet, Rfl:  0  Past Medical History: Past Medical History:  Diagnosis Date  . Hypertension     Tobacco Use: History  Smoking Status  . Never Smoker  Smokeless Tobacco  . Never Used    Comment: never smoked or used tobacco products    Labs: Recent Review Flowsheet Data    Labs for ITP Cardiac and Pulmonary Rehab Latest Ref Rng & Units 12/24/2008 12/27/2008   PHART 7.350 - 7.400 7.432(H) 7.366   PCO2ART 35.0 - 45.0 mmHg 40.2 45.6(H)   HCO3 20.0 - 24.0 mEq/L 26.3(H) 25.5(H)   TCO2 0 - 100 mmol/L 27.6 26.9   O2SAT % 93.4 97.5      Capillary Blood Glucose: Lab Results  Component Value Date   GLUCAP 113 (H) 06/23/2016   GLUCAP 150 (H) 06/23/2016   GLUCAP 92 12/30/2008   GLUCAP 169 (H) 12/29/2008   GLUCAP 210 (H) 12/29/2008       POCT Glucose    Row Name 06/23/16 1239 06/28/16 1212 06/30/16 1232 07/07/16 1244       POCT Blood Glucose   Pre-Exercise 150 mg/dL 109 mg/dL 120 mg/dL 109 mg/dL    Post-Exercise 113 mg/dL 116 mg/dL -  unable to get blood, given graham cracker and peanut butter given 134 mg/dL       ADL UCSD:     Pulmonary Assessment Scores    Row Name 06/22/16 1419         ADL UCSD   ADL  Phase Entry     SOB Score total 65       CAT Score   CAT Score 25  Pre        Pulmonary Function Assessment:     Pulmonary Function Assessment - 05/30/16 1033      Breath   Bilateral Breath Sounds Clear   Shortness of Breath Yes;Limiting activity      Exercise Target Goals:    Exercise Program Goal: Individual exercise prescription set with THRR, safety & activity barriers. Participant demonstrates ability to understand and report RPE using BORG scale, to self-measure pulse accurately, and to acknowledge the importance of the exercise prescription.  Exercise Prescription Goal: Starting with aerobic activity 30 plus minutes a day, 3 days per week for initial exercise prescription. Provide home exercise prescription and guidelines that participant acknowledges  understanding prior to discharge.  Activity Barriers & Risk Stratification:     Activity Barriers & Cardiac Risk Stratification - 05/30/16 1038      Activity Barriers & Cardiac Risk Stratification   Activity Barriers None      6 Minute Walk:     6 Minute Walk    Row Name 06/17/16 0922         6 Minute Walk   Phase Initial     Distance 610 feet     Walk Time -  4 minutes 55 seconds     # of Rest Breaks 2  1st: 45 seconds 2nd: 10 seconds     MPH 1.15     METS 1.84     RPE 12     Perceived Dyspnea  3     Symptoms No     Resting HR 84 bpm     Resting BP 138/88     Max Ex. HR 113 bpm     Max Ex. BP 142/90       Interval HR   Baseline HR 84     1 Minute HR 103     2 Minute HR 113     3 Minute HR 97     4 Minute HR 99     5 Minute HR 107     6 Minute HR 111     2 Minute Post HR 86     Interval Heart Rate? Yes       Interval Oxygen   Interval Oxygen? Yes     Baseline Oxygen Saturation % 99 %     Baseline Liters of Oxygen 15 L     1 Minute Oxygen Saturation % 92 %     1 Minute Liters of Oxygen 15 L     2 Minute Oxygen Saturation % 80 %     2 Minute Liters of Oxygen 15 L     3 Minute Oxygen Saturation % 98 %  break 2:05     3 Minute Liters of Oxygen 15 L     4 Minute Oxygen Saturation % 95 %     4 Minute Liters of Oxygen 15 L     5 Minute Oxygen Saturation % 88 %  break 5:40     5 Minute Liters of Oxygen 15 L     6 Minute Oxygen Saturation % 82 %     6 Minute Liters of Oxygen 15 L     2 Minute Post Oxygen Saturation % 94 %     2 Minute Post Liters of Oxygen 15 L        Oxygen Initial  Assessment:     Oxygen Initial Assessment - 06/17/16 0920      Initial 6 min Walk   Oxygen Used Continuous;E-Tanks   Liters per minute 15   Resting Oxygen Saturation  during 6 min walk 99 %   Exercise Oxygen Saturation  during 6 min walk 80 %  rest break initiated by staff     Program Oxygen Prescription   Program Oxygen Prescription Continuous;E-Tanks   Liters  per minute 15   Comments Patient will bring oximizer with her to rehab-this may help with discomfort of high liter flow and low oxygen saturations.      Oxygen Re-Evaluation:     Oxygen Re-Evaluation    Row Name 07/12/16 0943             Program Oxygen Prescription   Program Oxygen Prescription Continuous;E-Tanks       Liters per minute 15       Comments using home oximizer but requires an increase in her liter flow to maintain saturations >88 with exertion. Order obtained for NRB or Venti mask at 25 liters         [place] Concentrator;E-Tanks       Sleep Oxygen Prescription Continuous       Liters per minute 6       Home Exercise Oxygen Prescription Continuous       Liters per minute 15       Home at Rest Exercise Oxygen Prescription Continuous       Liters per minute 6       Compliance with Home Oxygen Use Yes         Goals/Expected Outcomes   Short Term Goals To learn and exhibit compliance with exercise, home and travel O2 prescription;To learn and understand importance of monitoring SPO2 with pulse oximeter and demonstrate accurate use of the pulse oximeter.;To Learn and understand importance of maintaining oxygen saturations>88%;To learn and demonstrate proper purse lipped breathing techniques or other breathing techniques.       Long  Term Goals Exhibits compliance with exercise, home and travel O2 prescription;Verbalizes importance of monitoring SPO2 with pulse oximeter and return demonstration;Maintenance of O2 saturations>88%;Exhibits proper breathing techniques, such as purse lipped breathing or other method taught during program session;Compliance with respiratory medication       Comments patient able to adjust oxygen flow at pulmonary rehab based on her self monitoring of oxygen saturations.          Oxygen Discharge (Final Oxygen Re-Evaluation):     Oxygen Re-Evaluation - 07/12/16 0943      Program Oxygen Prescription    Program Oxygen Prescription Continuous;E-Tanks   Liters per minute 15   Comments using home oximizer but requires an increase in her liter flow to maintain saturations >88 with exertion. Order obtained for NRB or Venti mask at 25 liters     [place] Concentrator;E-Tanks   Sleep Oxygen Prescription Continuous   Liters per minute 6   Home Exercise Oxygen Prescription Continuous   Liters per minute 15   Home at Rest Exercise Oxygen Prescription Continuous   Liters per minute 6   Compliance with Home Oxygen Use Yes     Goals/Expected Outcomes   Short Term Goals To learn and exhibit compliance with exercise, home and travel O2 prescription;To learn and understand importance of monitoring SPO2 with pulse oximeter and demonstrate accurate use of the pulse oximeter.;To Learn and understand  importance of maintaining oxygen saturations>88%;To learn and demonstrate proper purse lipped breathing techniques or other breathing techniques.   Long  Term Goals Exhibits compliance with exercise, home and travel O2 prescription;Verbalizes importance of monitoring SPO2 with pulse oximeter and return demonstration;Maintenance of O2 saturations>88%;Exhibits proper breathing techniques, such as purse lipped breathing or other method taught during program session;Compliance with respiratory medication   Comments patient able to adjust oxygen flow at pulmonary rehab based on her self monitoring of oxygen saturations.      Initial Exercise Prescription:     Initial Exercise Prescription - 06/17/16 0900      Date of Initial Exercise RX and Referring Provider   Date 06/17/16   Referring Provider Dr. Nelda Marseille  Dr. [name] ([facility])     Oxygen   Oxygen Continuous   Liters 15     Recumbant Bike   Level 2   Minutes 15     NuStep   Level 2   Minutes 17   METs 1.5     Track   Laps 5   Minutes 17     Prescription Details   Frequency (times per week) 2   Duration  Progress to 45 minutes of aerobic exercise without signs/symptoms of physical distress     Intensity   THRR 40-80% of Max Heartrate 70-139   Ratings of Perceived Exertion 11-13   Perceived Dyspnea 0-4     Progression   Progression Continue progressive overload as per policy without signs/symptoms or physical distress.     Resistance Training   Training Prescription Yes   Weight ORANGE BANDS   Reps 10-15      Perform Capillary Blood Glucose checks as needed.  Exercise Prescription Changes:     Exercise Prescription Changes    Row Name 06/23/16 1200 06/28/16 1200 06/30/16 1200 07/05/16 1200 07/07/16 1242     Response to Exercise   Blood Pressure (Admit) 106/80 115/77 120/80 116/82 116/60   Blood Pressure (Exercise) 110/70 126/80 140/70 130/86 142/94   Blood Pressure (Exit) 124/94 108/70 118/54 126/70 114/74   Heart Rate (Admit) 105 bpm 84 bpm 80 bpm 89 bpm 78 bpm   Heart Rate (Exercise) 118 bpm 95 bpm 86 bpm 101 bpm 110 bpm   Heart Rate (Exit) 86 bpm 82 bpm 77 bpm 85 bpm 78 bpm   Oxygen Saturation (Admit) 88 % 100 % 100 % 98 % 100 %   Oxygen Saturation (Exercise) 88 % 85 % 83 % 84 % 79 %   Oxygen Saturation (Exit) 100 % 99 % 99 % 99 % 78 %   Rating of Perceived Exertion (Exercise) _0 Perceived Dyspnea (Exercise) 0._1 Duration Progress to 45 minutes of aerobic exercise without signs/symptoms of physical distress Progress to 45 minutes of aerobic exercise without signs/symptoms of physical distress Progress to 45 minutes of aerobic exercise without signs/symptoms of physical distress Progress to 45 minutes of aerobic exercise without signs/symptoms of physical distress Progress to 45 minutes of aerobic exercise without signs/symptoms of physical distress   Intensity _2      Progression   Progression Continue to progress workloads to maintain intensity without signs/symptoms of physical  distress. Continue to progress workloads to maintain intensity without signs/symptoms of physical distress. Continue to progress workloads to maintain intensity without signs/symptoms of physical distress. Continue to progress workloads to maintain intensity without signs/symptoms of physical distress. Continue  to progress workloads to maintain intensity without signs/symptoms of physical distress.     Resistance Training   Training Prescription _0    Weight _1    Reps 10-15 10-15 10-15 10-15 10-15   Time  -  - 10 Minutes  10 minutes 10 Minutes 10 Minutes     Interval Training   Interval Training  -  - No No No     Oxygen   Oxygen _2    Liters 15 15 15-25 L 15-25 L 15-25 L     Recumbant Bike   Level _3 -   Minutes _4 -     NuStep   Level _5 Minutes _6 METs 1.5 1.4 1.6 1.6  -     Track   Laps  - 4  - 6 6   Minutes  - 17  - 17 17      Exercise Comments:   Exercise Goals and Review:   Exercise Goals Re-Evaluation :     Exercise Goals Re-Evaluation    Row Name 07/11/16 1103             Exercise Goal Re-Evaluation   Exercise Goals Review Increase Physical Activity;Increase Strenth and Stamina       Comments Patient has attended 5 sessions. She is on high liter flow (15 liters) and still desaturates into the 80's. Due to desaturation and being deconditioned she is averaging 1.4-1.6 METs. We are waiting on approval for additional guidlines on liter flow and oxygen saturation parameters. Will cont to monitor and progress as appropriate.        Expected Outcomes Through exercise at rehab and at home patient will increase physical capacity, strength, and stamina.           Discharge Exercise Prescription (Final Exercise Prescription Changes):     Exercise Prescription Changes - 07/07/16 1242       Response to Exercise   Blood Pressure (Admit) 116/60   Blood Pressure (Exercise) 142/94   Blood Pressure (Exit) 114/74   Heart Rate (Admit) 78 bpm   Heart Rate (Exercise) 110 bpm   Heart Rate (Exit) 78 bpm   Oxygen Saturation (Admit) 100 %   Oxygen Saturation (Exercise) 79 %   Oxygen Saturation (Exit) 78 %   Rating of Perceived Exertion (Exercise) 13   Perceived Dyspnea (Exercise) 3   Duration Progress to 45 minutes of aerobic exercise without signs/symptoms of physical distress   Intensity THRR unchanged     Progression   Progression Continue to progress workloads to maintain intensity without signs/symptoms of physical distress.     Resistance Training   Training Prescription Yes   Weight ORANGE BANDS   Reps 10-15   Time 10 Minutes     Interval Training   Interval Training No     Oxygen   Oxygen Continuous   Liters 15-25 L     NuStep   Level 3   Minutes 17     Track   Laps 6   Minutes 17      Nutrition:  Target Goals: Understanding of nutrition guidelines, daily intake of sodium <1519m, cholesterol <202m calories 30% from fat and 7% or less from saturated fats, daily to have 5 or more servings of fruits and vegetables.  Biometrics:  Pre Biometrics - 05/30/16 1042      Pre Biometrics   Grip Strength 25 kg       Nutrition Therapy Plan and Nutrition Goals:   Nutrition Discharge: Rate Your Plate Scores:   Nutrition Goals Re-Evaluation:   Nutrition Goals Discharge (Final Nutrition Goals Re-Evaluation):   Psychosocial: Target Goals: Acknowledge presence or absence of significant depression and/or stress, maximize coping skills, provide positive support system. Participant is able to verbalize types and ability to use techniques and skills needed for reducing stress and depression.  Initial Review & Psychosocial Screening:     Initial Psych Review & Screening - 05/30/16 1046      Initial Review   Current issues with None Identified      Family Dynamics   Good Support System? Yes     Barriers   Psychosocial barriers to participate in program There are no identifiable barriers or psychosocial needs.     Screening Interventions   Interventions Encouraged to exercise      Quality of Life Scores:   PHQ-9: Recent Review Flowsheet Data    Depression screen [facility] 2/9 05/30/2016 09/23/2014   Decreased Interest 0 0   Down, Depressed, Hopeless 0 0   PHQ - 2 Score 0 0     Interpretation of Total Score  Total Score Depression Severity:  1-4 = Minimal depression, 5-9 = Mild depression, 10-14 = Moderate depression, 15-19 = Moderately severe depression, 20-27 = Severe depression   Psychosocial Evaluation and Intervention:     Psychosocial Evaluation - 05/30/16 1047      Psychosocial Evaluation & Interventions   Interventions Encouraged to exercise with the program and follow exercise prescription   Continue Psychosocial Services  No Follow up required      Psychosocial Re-Evaluation:     Psychosocial Re-Evaluation    [place] Name 07/12/16 0947             Psychosocial Re-Evaluation   Current issues with Current Stress Concerns       Interventions Encouraged to attend Pulmonary Rehabilitation for the exercise  will refer to therapist or counselor as indicated       Continue Psychosocial Services  Follow up required by staff       Comments progression of her disease         Initial Review   Source of Stress Concerns Chronic Illness          Psychosocial Discharge (Final Psychosocial Re-Evaluation):     Psychosocial Re-Evaluation - 07/12/16 0947      Psychosocial Re-Evaluation   Current issues with Current Stress Concerns   Interventions Encouraged to attend Pulmonary Rehabilitation for the exercise  will refer to therapist or counselor as indicated   Continue Psychosocial Services  Follow up required by staff   Comments progression of her disease     Initial Review   Source of Stress Concerns Chronic  Illness      Education: Education Goals: Education classes will be provided on a weekly basis, covering required topics. Participant will state understanding/return demonstration of topics presented.  Learning Barriers/Preferences:     Learning Barriers/Preferences - 05/30/16 1032      Learning Barriers/Preferences   Learning Barriers None   Learning Preferences Pictoral;Video      Education Topics: Risk Factor Reduction:  -Group instruction that is supported by a PowerPoint presentation. Instructor discusses the definition of a risk factor, different risk factors for pulmonary disease, and how the heart and lungs work together.  Nutrition for Pulmonary Patient:  -Group instruction provided by PowerPoint slides, verbal discussion, and written materials to support subject matter. The instructor gives an explanation and review of healthy diet recommendations, which includes a discussion on weight management, recommendations for fruit and vegetable consumption, as well as protein, fluid, caffeine, fiber, sodium, sugar, and alcohol. Tips for eating when patients are short of breath are discussed.   Pursed Lip Breathing:  -Group instruction that is supported by demonstration and informational handouts. Instructor discusses the benefits of pursed lip and diaphragmatic breathing and detailed demonstration on how to preform both.     Oxygen Safety:  -Group instruction provided by PowerPoint, verbal discussion, and written material to support subject matter. There is an overview of "What is Oxygen" and "Why do we need it".  Instructor also reviews how to create a safe environment for oxygen use, the importance of using oxygen as prescribed, and the risks of noncompliance. There is a brief discussion on traveling with oxygen and resources the patient may utilize.   Oxygen Equipment:  -Group instruction provided by [facility] Staff utilizing handouts, written materials, and equipment  demonstrations.   PULMONARY REHAB OTHER RESPIRATORY from 07/07/2016 in [place]  Date  06/30/16  Educator  Ace Gins  Instruction Review Code  2- meets goals/outcomes      Signs and Symptoms:  -Group instruction provided by written material and verbal discussion to support subject matter. Warning signs and symptoms of infection, stroke, and heart attack are reviewed and when to call the physician/911 reinforced. Tips for preventing the spread of infection discussed.   Advanced Directives:  -Group instruction provided by verbal instruction and written material to support subject matter. Instructor reviews Advanced Directive laws and proper instruction for filling out document.   Pulmonary Video:  -Group video education that reviews the importance of medication and oxygen compliance, exercise, good nutrition, pulmonary hygiene, and pursed lip and diaphragmatic breathing for the pulmonary patient.   Exercise for the Pulmonary Patient:  -Group instruction that is supported by a PowerPoint presentation. Instructor discusses benefits of exercise, core components of exercise, frequency, duration, and intensity of an exercise routine, importance of utilizing pulse oximetry during exercise, safety while exercising, and options of places to exercise outside of rehab.     Pulmonary Medications:  -Verbally interactive group education provided by instructor with focus on inhaled medications and proper administration.   PULMONARY REHAB OTHER RESPIRATORY from 06/23/2016 in [place]  Date  06/23/16  Educator  Pharmacy  Instruction Review Code  2- meets goals/outcomes      Anatomy and Physiology of the Respiratory System and Intimacy:  -Group instruction provided by PowerPoint, verbal discussion, and written material to support subject matter. Instructor reviews respiratory cycle and anatomical components of the respiratory system and  their functions. Instructor also reviews differences in obstructive and restrictive respiratory diseases with examples of each. Intimacy, Sex, and Sexuality differences are reviewed with a discussion on how relationships can change when diagnosed with pulmonary disease. Common sexual concerns are reviewed.   MD DAY -A group question and answer session with a medical doctor that allows participants to ask questions that relate to their pulmonary disease state.   OTHER EDUCATION -Group or individual verbal, written, or video instructions that support the educational goals of the pulmonary rehab program.   Knowledge Questionnaire Score:     Knowledge Questionnaire Score - 06/22/16 1419      Knowledge Questionnaire Score  Pre Score 10/13      Core Components/Risk Factors/Patient Goals at Admission:     Personal Goals and Risk Factors at Admission - 05/30/16 1043      Core Components/Risk Factors/Patient Goals on Admission    Weight Management Weight Loss;Obesity   Improve shortness of breath with ADL's Yes   Intervention Provide education, individualized exercise plan and daily activity instruction to help decrease symptoms of SOB with activities of daily living.   Expected Outcomes Short Term: Achieves a reduction of symptoms when performing activities of daily living.   Increase knowledge of respiratory medications and ability to use respiratory devices properly  Yes   Intervention Provide education and demonstration as needed of appropriate use of medications, inhalers, and oxygen therapy.   Expected Outcomes Short Term: Achieves understanding of medications use. Understands that oxygen is a medication prescribed by physician. Demonstrates appropriate use of inhaler and oxygen therapy.      Core Components/Risk Factors/Patient Goals Review:      Goals and Risk Factor Review    Row Name 07/12/16 0946             Core Components/Risk Factors/Patient Goals Review    Personal Goals Review Weight Management/Obesity;Develop more efficient breathing techniques such as purse lipped breathing and diaphragmatic breathing and practicing self-pacing with activity.;Improve shortness of breath with ADL's;Increase knowledge of respiratory medications and ability to use respiratory devices properly.;Stress       Review see comment section on ITP       Expected Outcomes see admission expected outcomes          Core Components/Risk Factors/Patient Goals at Discharge (Final Review):      Goals and Risk Factor Review - 07/12/16 0946      Core Components/Risk Factors/Patient Goals Review   Personal Goals Review Weight Management/Obesity;Develop more efficient breathing techniques such as purse lipped breathing and diaphragmatic breathing and practicing self-pacing with activity.;Improve shortness of breath with ADL's;Increase knowledge of respiratory medications and ability to use respiratory devices properly.;Stress   Review see comment section on ITP   Expected Outcomes see admission expected outcomes      ITP Comments:   Comments: ITP REVIEW Pt is making some progress toward pulmonary rehab goals after completing 5 sessions. By eating healthy she has been able to loose 2 kg. She is not making any physical progress towards her stamina and strength and reduction in shortness of breath. Her ability to exercise has been limited by her low oxygen saturations. Spoke with [place] physician office this am and patient has been prescribed 25 L with NRB or Ventimask in order to maintain saturations >88%.  Anticipate patient having the ability to work harder in rehab with more efficient oxygen delivery.Recommend continued exercise, life style modification, education, and utilization of breathing techniques to increase stamina and strength and decrease shortness of breath with exertion.

## 2016-07-12 NOTE — Progress Notes (Signed)
Daily Session Note  Patient Details  Name: Felicia [name] MRN: [id number] Date of Birth: 03/27/1970 Referring Provider:     Pulmonary Rehab Walk Test from 06/16/2016 in [place]  Referring Provider  Dr. Nelda Marseille [Dr. [name] ([facility])]      Encounter Date: 07/12/2016  Check In:     Session Check In - 07/12/16 1045      Check-In   Location [facility]   Staff Present Trish Fountain, RN, Maxcine Ham, RN, BSN;Koltan Portocarrero, RN;Molly diVincenzo, MS, ACSM RCEP, Exercise Physiologist   Supervising physician immediately available to respond to emergencies [facility] immediately available   Physician(s) Dr. Broadus John   Medication changes reported     No   Fall or balance concerns reported    No   Tobacco Cessation No Change   Warm-up and Cool-down Performed as group-led instruction   Resistance Training Performed Yes   VAD Patient? No     Pain Assessment   Currently in Pain? No/denies   Multiple Pain Sites No      Capillary Blood Glucose: No results found for this or any previous visit (from the past 24 hour(s)).     POCT Glucose - 07/12/16 1240      POCT Blood Glucose   Pre-Exercise 183 mg/dL   Post-Exercise 163 mg/dL         Exercise Prescription Changes - 07/12/16 1200      Response to Exercise   Blood Pressure (Admit) 116/72   Blood Pressure (Exercise) 110/82   Blood Pressure (Exit) 120/80   Heart Rate (Admit) 87 bpm   Heart Rate (Exercise) 107 bpm   Heart Rate (Exit) 96 bpm   Oxygen Saturation (Admit) 99 %   Oxygen Saturation (Exercise) 92 %   Oxygen Saturation (Exit) 99 %   Rating of Perceived Exertion (Exercise) 13   Perceived Dyspnea (Exercise) 0   Duration Progress to 45 minutes of aerobic exercise without signs/symptoms of physical distress   Intensity THRR unchanged     Progression   Progression Continue to progress workloads to maintain intensity without signs/symptoms of physical distress.      Resistance Training   Training Prescription Yes   Weight orange bands   Reps 10-15   Time 10 Minutes     Interval Training   Interval Training No     Oxygen   Oxygen Continuous   Liters 15-25  Used nonrebreather mask today     Recumbant Bike   Level 2   Minutes 17     NuStep   Level 3   Minutes 17   METs 1.8     Track   Laps 7   Minutes 17      History  Smoking Status  . Never Smoker  Smokeless Tobacco  . Never Used    Comment: never smoked or used tobacco products    Goals Met:  No report of cardiac concerns or symptoms Strength training completed today  Goals Unmet:  Not Applicable  Comments: Service time is from 1030 to 1210    Dr. Rush Farmer is Medical Director for Pulmonary Rehab at [facility].

## 2016-07-14 ENCOUNTER — Encounter (HOSPITAL_COMMUNITY)
Admission: RE | Admit: 2016-07-14 | Discharge: 2016-07-14 | Disposition: A | Discharge status: Home / Self Care | Payer: 59 | Source: Ambulatory Visit | Attending: Pulmonary Disease | Admitting: Pulmonary Disease

## 2016-07-14 VITALS — Wt 241.4 lb

## 2016-07-14 DIAGNOSIS — J84113 Idiopathic non-specific interstitial pneumonitis: Secondary | ICD-10-CM

## 2016-07-14 NOTE — Progress Notes (Signed)
Daily Session Note  Patient Details  Name: Felicia [name] MRN: [id number] Date of Birth: 20-Jan-1971 Referring Provider:     Pulmonary Rehab Walk Test from 06/16/2016 in [place]  Referring Provider  Dr. Nelda Marseille [Dr. [name] ([facility])]      Encounter Date: 07/14/2016  Check In:     Session Check In - 07/14/16 1025      Check-In   Location [facility]   Staff Present Trish Fountain, RN, Maxcine Ham, RN, BSN;Molly diVincenzo, MS, ACSM RCEP, Exercise Physiologist;Yandel Zeiner Ysidro Evert, RN   Supervising physician immediately available to respond to emergencies [facility] immediately available   Physician(s) Dr. Posey Pronto   Medication changes reported     No   Fall or balance concerns reported    No   Tobacco Cessation No Change   Warm-up and Cool-down Performed as group-led instruction   Resistance Training Performed Yes   VAD Patient? No     Pain Assessment   Currently in Pain? No/denies   Multiple Pain Sites No      Capillary Blood Glucose: No results found for this or any previous visit (from the past 24 hour(s)).     POCT Glucose - 07/14/16 1222      POCT Blood Glucose   Pre-Exercise 111 mg/dL  ate peanutbutter and cracker and greek yogurt before exercise   Post-Exercise 135 mg/dL         Exercise Prescription Changes - 07/14/16 1200      Response to Exercise   Blood Pressure (Admit) 106/76   Blood Pressure (Exercise) 132/82   Blood Pressure (Exit) 114/70   Heart Rate (Admit) 73 bpm   Heart Rate (Exercise) 114 bpm   Heart Rate (Exit) 86 bpm   Oxygen Saturation (Admit) 100 %   Oxygen Saturation (Exercise) 100 %   Oxygen Saturation (Exit) 99 %   Rating of Perceived Exertion (Exercise) 13   Perceived Dyspnea (Exercise) 0   Duration Progress to 45 minutes of aerobic exercise without signs/symptoms of physical distress   Intensity THRR unchanged     Progression   Progression Continue to progress workloads  to maintain intensity without signs/symptoms of physical distress.     Resistance Training   Training Prescription Yes   Weight orange bands   Reps 10-15   Time 10 Minutes     Interval Training   Interval Training No     Oxygen   Oxygen Continuous   Liters 15L  Only had to use 15L today with nonrebreather mask     Recumbant Bike   Level 2   Minutes 17     NuStep   Level 4   Minutes 17   METs 1.9     Track   Laps 10   Minutes 17      History  Smoking Status  . Never Smoker  Smokeless Tobacco  . Never Used    Comment: never smoked or used tobacco products    Goals Met:  Exercise tolerated well No report of cardiac concerns or symptoms Strength training completed today  Goals Unmet:  Not Applicable  Comments: Service time is from 1030 to 1200    Dr. Rush Farmer is Medical Director for Pulmonary Rehab at [facility].

## 2016-07-19 ENCOUNTER — Encounter (HOSPITAL_COMMUNITY)
Admission: RE | Admit: 2016-07-19 | Discharge: 2016-07-19 | Disposition: A | Discharge status: Home / Self Care | Payer: 59 | Source: Ambulatory Visit | Attending: Pulmonary Disease | Admitting: Pulmonary Disease

## 2016-07-19 VITALS — Wt 244.3 lb

## 2016-07-19 DIAGNOSIS — J84113 Idiopathic non-specific interstitial pneumonitis: Secondary | ICD-10-CM | POA: Diagnosis not present

## 2016-07-19 NOTE — Progress Notes (Signed)
Daily Session Note  Patient Details  Name: Felicia [name] MRN: [id number] Date of Birth: 05/20/1970 Referring Provider:     Pulmonary Rehab Walk Test from 06/16/2016 in [place]  Referring Provider  Dr. Nelda Marseille [Dr. [name] ([facility])]      Encounter Date: 07/19/2016  Check In:     Session Check In - 07/19/16 1015      Check-In   [facility]   Staff Present Trish Fountain, RN, Maxcine Ham, RN, BSN   Supervising physician immediately available to respond to emergencies [facility] immediately available   Physician(s) Dr. Posey Pronto   Medication changes reported     No   Fall or balance concerns reported    No   Tobacco Cessation No Change   Warm-up and Cool-down Performed as group-led instruction   Resistance Training Performed Yes   VAD Patient? No     Pain Assessment   Currently in Pain? No/denies   Multiple Pain Sites No      Capillary Blood Glucose: No results found for this or any previous visit (from the past 24 hour(s)).     POCT Glucose - 07/19/16 1209      POCT Blood Glucose   Pre-Exercise 109 mg/dL   Post-Exercise 126 mg/dL         Exercise Prescription Changes - 07/19/16 1200      Response to Exercise   Blood Pressure (Admit) 106/72   Blood Pressure (Exercise) 144/90   Blood Pressure (Exit) 116/86   Heart Rate (Admit) 76 bpm   Heart Rate (Exercise) 105 bpm   Heart Rate (Exit) 86 bpm   Oxygen Saturation (Admit) 100 %   Oxygen Saturation (Exercise) 93 %   Oxygen Saturation (Exit) 96 %   Rating of Perceived Exertion (Exercise) 11   Perceived Dyspnea (Exercise) 0   Duration Progress to 45 minutes of aerobic exercise without signs/symptoms of physical distress   Intensity THRR unchanged     Progression   Progression Continue to progress workloads to maintain intensity without signs/symptoms of physical distress.     Resistance Training   Training Prescription Yes   Weight  orange bands   Reps 10-15   Time 10 Minutes     Interval Training   Interval Training No     Oxygen   Oxygen Continuous   Liters 15L  Only had to use 15L today with nonrebreather mask     Recumbant Bike   Level 2   Minutes 17     NuStep   Level 4   Minutes 17   METs 1.8     Track   Laps 5   Minutes 17      History  Smoking Status  . Never Smoker  Smokeless Tobacco  . Never Used    Comment: never smoked or used tobacco products    Goals Met:  Exercise tolerated well No report of cardiac concerns or symptoms Strength training completed today  Goals Unmet:  Not Applicable  Comments: Service time is from 10:30a to 12:00p    Dr. Rush Farmer is Medical Director for Pulmonary Rehab at [facility].

## 2016-07-21 ENCOUNTER — Encounter (HOSPITAL_COMMUNITY)
Admission: RE | Admit: 2016-07-21 | Discharge: 2016-07-21 | Disposition: A | Discharge status: Home / Self Care | Payer: 59 | Source: Ambulatory Visit | Attending: Pulmonary Disease | Admitting: Pulmonary Disease

## 2016-07-21 VITALS — Wt 244.0 lb

## 2016-07-21 DIAGNOSIS — J84113 Idiopathic non-specific interstitial pneumonitis: Secondary | ICD-10-CM | POA: Diagnosis not present

## 2016-07-21 NOTE — Progress Notes (Signed)
Daily Session Note  Patient Details  Name: Felicia [name] MRN: [id number] Date of Birth: 1970-06-22 Referring Provider:     Pulmonary Rehab Walk Test from 06/16/2016 in [place]  Referring Provider  Dr. Nelda Marseille [Dr. [name] ([facility])]      Encounter Date: 07/21/2016  Check In:     Session Check In - 07/21/16 1015      Check-In   [facility]   Staff Present Su Hilt, MS, ACSM RCEP, Exercise Physiologist;Ashvik Grundman Rollene Rotunda, RN, BSN   Supervising physician immediately available to respond to emergencies [facility] immediately available   Physician(s) Dr. Sloan Leiter   Medication changes reported     No   Fall or balance concerns reported    No   Tobacco Cessation No Change   Warm-up and Cool-down Performed as group-led instruction   Resistance Training Performed Yes   VAD Patient? No     Pain Assessment   Currently in Pain? No/denies   Multiple Pain Sites No      Capillary Blood Glucose: No results found for this or any previous visit (from the past 24 hour(s)).      Exercise Prescription Changes - 07/21/16 1247      Response to Exercise   Blood Pressure (Admit) 122/72   Blood Pressure (Exercise) 128/80   Blood Pressure (Exit) 130/82   Heart Rate (Admit) 79 bpm   Heart Rate (Exercise) 91 bpm   Heart Rate (Exit) 74 bpm   Oxygen Saturation (Admit) 99 %   Oxygen Saturation (Exercise) 96 %   Oxygen Saturation (Exit) 98 %   Rating of Perceived Exertion (Exercise) 12   Perceived Dyspnea (Exercise) 0   Duration Progress to 45 minutes of aerobic exercise without signs/symptoms of physical distress   Intensity THRR unchanged     Progression   Progression Continue to progress workloads to maintain intensity without signs/symptoms of physical distress.     Resistance Training   Training Prescription Yes   Weight orange bands   Reps 10-15   Time 10 Minutes     Interval Training   Interval Training  No     Oxygen   Oxygen Continuous   Liters 15L  Only had to use 15L today with nonrebreather mask     NuStep   Level 4   Minutes 17   METs 2     Track   Laps 8   Minutes 17      History  Smoking Status  . Never Smoker  Smokeless Tobacco  . Never Used    Comment: never smoked or used tobacco products    Goals Met:  Achieving weight loss Changing diet to healthy choices, watching portion sizes Exercise tolerated well No report of cardiac concerns or symptoms Strength training completed today  Goals Unmet:  Not Applicable  Comments: Service time is from 1030 to 1230   Dr. Rush Farmer is Medical Director for Pulmonary Rehab at [facility].

## 2016-07-26 ENCOUNTER — Encounter (HOSPITAL_COMMUNITY)
Admission: RE | Admit: 2016-07-26 | Discharge: 2016-07-26 | Disposition: A | Discharge status: Home / Self Care | Payer: 59 | Source: Ambulatory Visit | Attending: Pulmonary Disease | Admitting: Pulmonary Disease

## 2016-07-26 VITALS — Wt 244.5 lb

## 2016-07-26 DIAGNOSIS — J84113 Idiopathic non-specific interstitial pneumonitis: Secondary | ICD-10-CM | POA: Diagnosis not present

## 2016-07-26 NOTE — Progress Notes (Signed)
Daily Session Note  Patient Details  Name: Felicia [name] MRN: [id number] Date of Birth: Dec 02, 1970 Referring Provider:     Pulmonary Rehab Walk Test from 06/16/2016 in [place]  Referring Provider  Dr. Nelda Marseille [Dr. [name] ([facility])]      Encounter Date: 07/26/2016  Check In:     Session Check In - 07/26/16 1017      Check-In   Location [facility]   Staff Present Trish Fountain, RN, Maxcine Ham, RN, BSN;Molly diVincenzo, MS, ACSM RCEP, Exercise Physiologist   Supervising physician immediately available to respond to emergencies [facility] immediately available   Physician(s) Dr. Allyson Sabal   Medication changes reported     No   Fall or balance concerns reported    No   Tobacco Cessation No Change   Warm-up and Cool-down Performed as group-led instruction   Resistance Training Performed Yes   VAD Patient? No     Pain Assessment   Currently in Pain? No/denies   Multiple Pain Sites No      Capillary Blood Glucose: No results found for this or any previous visit (from the past 24 hour(s)).     POCT Glucose - 07/26/16 1225      POCT Blood Glucose   Pre-Exercise 109 mg/dL   Post-Exercise 122 mg/dL         Exercise Prescription Changes - 07/26/16 1222      Response to Exercise   Blood Pressure (Admit) 118/76   Blood Pressure (Exercise) 124/76   Blood Pressure (Exit) 122/82   Heart Rate (Admit) 82 bpm   Heart Rate (Exercise) 114 bpm   Heart Rate (Exit) 90 bpm   Oxygen Saturation (Admit) 100 %   Oxygen Saturation (Exercise) 88 %   Oxygen Saturation (Exit) 100 %   Rating of Perceived Exertion (Exercise) 12   Perceived Dyspnea (Exercise) 2   Duration Progress to 45 minutes of aerobic exercise without signs/symptoms of physical distress   Intensity THRR unchanged     Progression   Progression Continue to progress workloads to maintain intensity without signs/symptoms of physical distress.     Resistance Training   Training Prescription Yes   Weight orange bands   Reps 10-15   Time 10 Minutes     Interval Training   Interval Training No     Oxygen   Oxygen Continuous   Liters 15-25L     Treadmill   MPH 0.7   Minutes 17     Recumbant Bike   Level 2   Minutes 17     NuStep   Level 5   Minutes 17   METs 1.9      History  Smoking Status  . Never Smoker  Smokeless Tobacco  . Never Used    Comment: never smoked or used tobacco products    Goals Met:  Independence with exercise equipment Using PLB without cueing & demonstrates good technique Exercise tolerated well No report of cardiac concerns or symptoms Strength training completed today  Goals Unmet:  Not Applicable  Comments: Service time is from 1030 to 1200   Dr. Rush Farmer is Medical Director for Pulmonary Rehab at [facility].

## 2016-07-28 ENCOUNTER — Encounter (HOSPITAL_COMMUNITY)
Admission: RE | Admit: 2016-07-28 | Discharge: 2016-07-28 | Disposition: A | Discharge status: Home / Self Care | Payer: 59 | Source: Ambulatory Visit | Attending: Pulmonary Disease | Admitting: Pulmonary Disease

## 2016-07-28 VITALS — Wt 242.7 lb

## 2016-07-28 DIAGNOSIS — J84113 Idiopathic non-specific interstitial pneumonitis: Secondary | ICD-10-CM | POA: Diagnosis not present

## 2016-07-28 NOTE — Progress Notes (Signed)
Daily Session Note  Patient Details  Name: Felicia [name] MRN: [id number] Date of Birth: 09-15-1970 Referring Provider:     Pulmonary Rehab Walk Test from 06/16/2016 in [place]  Referring Provider  Dr. Nelda Marseille [Dr. [name] ([facility])]      Encounter Date: 07/28/2016  Check In:     Session Check In - 07/28/16 1100      Check-In   Location [facility]   Staff Present Su Hilt, MS, ACSM RCEP, Exercise Physiologist;Joan Leonia Reeves, RN, Luisa Hart, RN, BSN   Supervising physician immediately available to respond to emergencies [facility] immediately available   Physician(s) Dr. Allyson Sabal   Medication changes reported     No   Fall or balance concerns reported    No   Tobacco Cessation No Change   Warm-up and Cool-down Performed as group-led instruction   Resistance Training Performed Yes   VAD Patient? No     Pain Assessment   Currently in Pain? No/denies   Multiple Pain Sites No      Capillary Blood Glucose: No results found for this or any previous visit (from the past 24 hour(s)).      Exercise Prescription Changes - 07/28/16 1238      Response to Exercise   Blood Pressure (Admit) 122/76   Blood Pressure (Exercise) 132/70   Blood Pressure (Exit) 122/74   Heart Rate (Admit) 82 bpm   Heart Rate (Exercise) 102 bpm   Heart Rate (Exit) 87 bpm   Oxygen Saturation (Admit) 99 %   Oxygen Saturation (Exercise) 93 %   Oxygen Saturation (Exit) 99 %   Rating of Perceived Exertion (Exercise) 12   Perceived Dyspnea (Exercise) 0   Duration Progress to 45 minutes of aerobic exercise without signs/symptoms of physical distress   Intensity THRR unchanged     Progression   Progression Continue to progress workloads to maintain intensity without signs/symptoms of physical distress.     Resistance Training   Training Prescription Yes   Weight orange bands   Reps 10-15   Time 10 Minutes     Interval Training    Interval Training No     Oxygen   Oxygen Continuous   Liters 15-25L     Recumbant Bike   Level 2   Minutes 17     NuStep   Level 5   Minutes 17   METs 1.9      History  Smoking Status  . Never Smoker  Smokeless Tobacco  . Never Used    Comment: never smoked or used tobacco products    Goals Met:  Independence with exercise equipment Exercise tolerated well No report of cardiac concerns or symptoms Strength training completed today  Goals Unmet:  Not Applicable  Comments: Service time is from 1030 to 1220   Dr. Rush Farmer is Medical Director for Pulmonary Rehab at [facility].

## 2016-08-02 ENCOUNTER — Encounter (HOSPITAL_COMMUNITY)
Admission: RE | Admit: 2016-08-02 | Discharge: 2016-08-02 | Disposition: A | Discharge status: Home / Self Care | Payer: 59 | Source: Ambulatory Visit | Attending: Pulmonary Disease | Admitting: Pulmonary Disease

## 2016-08-02 VITALS — Wt 244.0 lb

## 2016-08-02 DIAGNOSIS — J84113 Idiopathic non-specific interstitial pneumonitis: Secondary | ICD-10-CM

## 2016-08-02 NOTE — Progress Notes (Signed)
Daily Session Note  Patient Details  Name: Felicia [name] MRN: [id number] Date of Birth: 06/17/1970 Referring Provider:     Pulmonary Rehab Walk Test from 06/16/2016 in [place]  Referring Provider  Dr. Nelda Marseille [Dr. [name] ([facility])]      Encounter Date: 08/02/2016  Check In:     Session Check In - 08/02/16 1016      Check-In   Location [facility]   Staff Present Rosebud Poles, RN, BSN;Molly diVincenzo, MS, ACSM RCEP, Exercise Physiologist;Lisa Ysidro Evert, RN   Supervising physician immediately available to respond to emergencies [facility] immediately available   Physician(s) Dr. Allyson Sabal   Medication changes reported     No   Fall or balance concerns reported    No   Tobacco Cessation No Change   Warm-up and Cool-down Performed as group-led instruction   Resistance Training Performed Yes   VAD Patient? No     Pain Assessment   Currently in Pain? No/denies   Multiple Pain Sites No      Capillary Blood Glucose: No results found for this or any previous visit (from the past 24 hour(s)).     POCT Glucose - 08/02/16 1224      POCT Blood Glucose   Pre-Exercise 119 mg/dL   Post-Exercise 119 mg/dL         Exercise Prescription Changes - 08/02/16 1200      Response to Exercise   Blood Pressure (Admit) 120/80   Blood Pressure (Exercise) 110/84   Blood Pressure (Exit) 104/60   Heart Rate (Admit) 77 bpm   Heart Rate (Exercise) 86 bpm   Heart Rate (Exit) 75 bpm   Oxygen Saturation (Admit) 100 %   Oxygen Saturation (Exercise) 99 %   Oxygen Saturation (Exit) 100 %   Rating of Perceived Exertion (Exercise) 12   Perceived Dyspnea (Exercise) 0   Duration Progress to 45 minutes of aerobic exercise without signs/symptoms of physical distress   Intensity THRR unchanged     Progression   Progression Continue to progress workloads to maintain intensity without signs/symptoms of physical distress.     Resistance  Training   Training Prescription Yes   Weight orange bands   Reps 10-15   Time 10 Minutes     Interval Training   Interval Training No     Oxygen   Oxygen Continuous   Liters 25     NuStep   Level 4   Minutes 51  workloads decreased today d/t exacerbation   METs 1.8      History  Smoking Status  . Never Smoker  Smokeless Tobacco  . Never Used    Comment: never smoked or used tobacco products    Goals Met:  Exercise tolerated well Strength training completed today  Goals Unmet:  Not Applicable  Comments: Service time is from 1030 to 1205    Dr. Rush Farmer is Medical Director for Pulmonary Rehab at [facility].

## 2016-08-04 ENCOUNTER — Encounter (HOSPITAL_COMMUNITY)
Admission: RE | Admit: 2016-08-04 | Discharge: 2016-08-04 | Disposition: A | Discharge status: Home / Self Care | Payer: 59 | Source: Ambulatory Visit | Attending: Pulmonary Disease | Admitting: Pulmonary Disease

## 2016-08-04 VITALS — Wt 246.0 lb

## 2016-08-04 DIAGNOSIS — J84113 Idiopathic non-specific interstitial pneumonitis: Secondary | ICD-10-CM | POA: Diagnosis not present

## 2016-08-04 NOTE — Progress Notes (Signed)
Felicia [name] 46 y.o. female             Nutrition Screen 1. Idiopathic non-specific interstitial pneumonitis [facility])    Past Medical History:  Diagnosis Date  . Hypertension    Meds reviewed. Metformin, Deltasone noted  Ht: Ht Readings from Last 1 Encounters:  05/30/16 5' 5" (1.651 m)    Wt:  Wt Readings from Last 3 Encounters:  08/02/16 244 lb 0.8 oz (110.7 kg)  07/28/16 242 lb 11.6 oz (110.1 kg)  07/26/16 244 lb 7.8 oz (110.9 kg)    BMI: 41.3    Current tobacco use? No  Labs:  Lipid Panel  No results found for: CHOL, TRIG, HDL, CHOLHDL, VLDL, LDLCALC, LDLDIRECT  No results found for: HGBA1C  Nutrition Diagnosis ? Food-and nutrition-related knowledge deficit related to lack of exposure to information as related to diagnosis of pulmonary disease ? Obesity related to excessive energy intake as evidenced by a BMI of 41.3  Goal(s) 1. Identify food quantities necessary to achieve wt loss of  -2# per week to a goal wt loss of 2.7-10.9 kg (6-24 lb) at graduation from pulmonary rehab. Plan:  Pt to attend Pulmonary Nutrition class - met Will provide client-centered nutrition education as part of interdisciplinary care.   Monitor and evaluate progress toward nutrition goal with team.  Monitor and Evaluate progress toward nutrition goal with team.   Derek Mound, M.Ed, RD, LDN, CDE 08/04/2016 11:20 AM

## 2016-08-04 NOTE — Progress Notes (Signed)
Daily Session Note  Patient Details  Name: Felicia [name] MRN: [id number] Date of Birth: [birth year] Referring Provider:     Pulmonary Rehab Walk Test from 06/16/2016 in [place]  Referring Provider  Dr. Nelda Marseille [Dr. [name] ([facility])]      Encounter Date: 08/04/2016  Check In:     Session Check In - 08/04/16 1103      Check-In   Location [facility]   Staff Present Su Hilt, MS, ACSM RCEP, Exercise Physiologist;Lisa [name], RN, [facility]   Supervising physician immediately available to respond to emergencies [facility] immediately available   Physician(s) Dr. Allyson Sabal   Medication changes reported     No   Fall or balance concerns reported    No   Tobacco Cessation No Change   Warm-up and Cool-down Performed as group-led instruction   Resistance Training Performed Yes   VAD Patient? No     Pain Assessment   Currently in Pain? No/denies   Multiple Pain Sites No      Capillary Blood Glucose: No results found for this or any previous visit (from the past 24 hour(s)).      Exercise Prescription Changes - 08/04/16 1200      Response to Exercise   Blood Pressure (Admit) 124/88   Blood Pressure (Exercise) 124/84   Blood Pressure (Exit) 104/70   Heart Rate (Admit) 82 bpm   Heart Rate (Exercise) 97 bpm   Heart Rate (Exit) 76 bpm   Oxygen Saturation (Admit) 89 %   Oxygen Saturation (Exercise) 94 %   Oxygen Saturation (Exit) 100 %   Rating of Perceived Exertion (Exercise) 12   Perceived Dyspnea (Exercise) 0   Duration Progress to 45 minutes of aerobic exercise without signs/symptoms of physical distress   Intensity THRR unchanged     Progression   Progression Continue to progress workloads to maintain intensity without signs/symptoms of physical distress.     Resistance Training   Training Prescription Yes   Weight orange bands   Reps 10-15   Time 10 Minutes     Interval  Training   Interval Training No     Oxygen   Oxygen Continuous   Liters 25     NuStep   Level 4   Minutes 34  workloads decreased today d/t exacerbation   METs 2      History  Smoking Status  . Never Smoker  Smokeless Tobacco  . Never Used    Comment: never smoked or used tobacco products    Goals Met:  Exercise tolerated well Strength training completed today  Goals Unmet:  Not Applicable  Comments: Service time is from 10:30 to 121:05    Dr. Rush Farmer is Medical Director for Pulmonary Rehab at [facility].

## 2016-08-09 ENCOUNTER — Encounter (HOSPITAL_COMMUNITY)
Admission: RE | Admit: 2016-08-09 | Discharge: 2016-08-09 | Disposition: A | Discharge status: Home / Self Care | Payer: 59 | Source: Ambulatory Visit | Attending: Pulmonary Disease | Admitting: Pulmonary Disease

## 2016-08-09 VITALS — Wt 243.8 lb

## 2016-08-09 DIAGNOSIS — Z79899 Other long term (current) drug therapy: Secondary | ICD-10-CM | POA: Insufficient documentation

## 2016-08-09 DIAGNOSIS — J84113 Idiopathic non-specific interstitial pneumonitis: Secondary | ICD-10-CM | POA: Diagnosis present

## 2016-08-09 DIAGNOSIS — I10 Essential (primary) hypertension: Secondary | ICD-10-CM | POA: Diagnosis not present

## 2016-08-09 DIAGNOSIS — Z7984 Long term (current) use of oral hypoglycemic drugs: Secondary | ICD-10-CM | POA: Insufficient documentation

## 2016-08-09 NOTE — Progress Notes (Signed)
Daily Session Note  Patient Details  Name: Felicia [name] MRN: [id number] Date of Birth: 05-Sep-1970 Referring Provider:     Pulmonary Rehab Walk Test from 06/16/2016 in [place]  Referring Provider  Dr. Nelda Marseille [Dr. [name] ([facility])]      Encounter Date: 08/09/2016  Check In:     Session Check In - 08/09/16 1030      Check-In   Location [facility]   Staff Present Su Hilt, MS, ACSM RCEP, Exercise Physiologist;Lisa Ysidro Evert, RN;Portia Cayey, RN, Maxcine Ham, RN, BSN   Supervising physician immediately available to respond to emergencies [facility] immediately available   Physician(s) Dr. Allyson Sabal   Medication changes reported     No   Fall or balance concerns reported    No   Tobacco Cessation No Change   Warm-up and Cool-down Performed as group-led instruction   Resistance Training Performed Yes   VAD Patient? No     Pain Assessment   Currently in Pain? No/denies   Multiple Pain Sites No      Capillary Blood Glucose: No results found for this or any previous visit (from the past 24 hour(s)).     POCT Glucose - 08/09/16 1233      POCT Blood Glucose   Pre-Exercise 119 mg/dL   Post-Exercise 136 mg/dL         Exercise Prescription Changes - 08/09/16 1200      Response to Exercise   Blood Pressure (Admit) 104/60   Blood Pressure (Exercise) 124/84   Blood Pressure (Exit) 104/60   Heart Rate (Admit) 78 bpm   Heart Rate (Exercise) 127 bpm   Heart Rate (Exit) 70 bpm   Oxygen Saturation (Admit) 100 %   Oxygen Saturation (Exercise) 93 %   Oxygen Saturation (Exit) 100 %   Rating of Perceived Exertion (Exercise) 12   Perceived Dyspnea (Exercise) 0   Duration Progress to 45 minutes of aerobic exercise without signs/symptoms of physical distress   Intensity THRR unchanged     Progression   Progression Continue to progress workloads to maintain intensity without signs/symptoms of physical  distress.     Resistance Training   Training Prescription Yes   Weight orange bands   Reps 10-15   Time 10 Minutes     Interval Training   Interval Training No     Oxygen   Oxygen Continuous   Liters 25     Treadmill   MPH 1   Grade 2   Minutes 17     Recumbant Bike   Level 2.1   Minutes 17     NuStep   Level 5   Minutes 17  workloads decreased today d/t exacerbation   METs 2      History  Smoking Status  . Never Smoker  Smokeless Tobacco  . Never Used    Comment: never smoked or used tobacco products    Goals Met:  Exercise tolerated well No report of cardiac concerns or symptoms Strength training completed today  Goals Unmet:  Not Applicable  Comments: Service time is from 10:30a to 12:10p    Dr. Rush Farmer is Medical Director for Pulmonary Rehab at [name].

## 2016-08-11 ENCOUNTER — Encounter (HOSPITAL_COMMUNITY)
Admission: RE | Admit: 2016-08-11 | Discharge: 2016-08-11 | Disposition: A | Discharge status: Home / Self Care | Payer: 59 | Source: Ambulatory Visit | Attending: Pulmonary Disease | Admitting: Pulmonary Disease

## 2016-08-11 VITALS — Wt 241.0 lb

## 2016-08-11 DIAGNOSIS — J84113 Idiopathic non-specific interstitial pneumonitis: Secondary | ICD-10-CM

## 2016-08-11 NOTE — Progress Notes (Signed)
Felicia [name] 46 y.o. female Nutrition Note Spoke with pt. Pt is obese and wants to lose wt. Pt has been trying to lose wt/prevent wt gain with prednisone use. Pt wt today is down 0.8 kg since admission. Pt states she eats 2 meals and some snacks daily. Pt eats small meals due to large meals cause SOB. Pt is interested in losing weight using Optifast. Optifast wt loss program discussed. There are some ways the pt can make her eating habits healthier. Pt's Rate Your Plate results reviewed with pt. Pt is aware of the need to watch sodium. Pt uses frozen dinners frequently due to "it's just me." Pt's daughter is home from college for the summer and has been cooking pt meals. Pt is diabetic. Pt checks CBG's once daily. No recent A1c noted. Pt expressed understanding of the information reviewed via feedback method.    No results found for: HGBA1C  Nutrition Diagnosis ? Food-and nutrition-related knowledge deficit related to lack of exposure to information as related to diagnosis of pulmonary disease ? Obesity related to excessive energy intake as evidenced by a BMI of 41.3  Nutrition Intervention ? Pt's individual nutrition plan and goals reviewed with pt. ? Benefits of adopting healthy eating habits discussed when pt's Rate Your Plate reviewed. ? Pt to add 600 mg Calcium Citrate with vitamin D supplement BID with meals.  ? Pt to attend the Nutrition and Lung Disease class ? Continual client-centered nutrition education by RD, as part of interdisciplinary care. Goal(s) 1. Identify food quantities necessary to achieve wt loss of  -2# per week to a goal wt loss of 2.7-10.9 kg (6-24 lb) at graduation from pulmonary rehab.  Monitor and Evaluate progress toward nutrition goal with team.   Derek Mound, M.Ed, RD, LDN, CDE 08/11/2016 1:53 PM

## 2016-08-11 NOTE — Progress Notes (Signed)
Pulmonary Individual Treatment Plan  Patient Details  Name: Felicia [name] MRN: [id number] Date of Birth: May 30, 1970 Referring Provider:     Pulmonary Rehab Walk Test from 06/16/2016 in [place]  Referring Provider  Dr. Nelda Marseille [Dr. [name] ([facility])]      Initial Encounter Date:    Pulmonary Rehab Walk Test from 06/16/2016 in [place]  Date  06/17/16  Referring Provider  Dr. Nelda Marseille [Dr. [name] ([facility])]      Visit Diagnosis: Idiopathic non-specific interstitial pneumonitis ([place])  Patient's Home Medications on Admission:   Current Outpatient Prescriptions:  .  albuterol (PROVENTIL HFA;VENTOLIN HFA) 108 (90 Base) MCG/ACT inhaler, Inhale 1 puff into the lungs every 6 (six) hours as needed for wheezing or shortness of breath., Disp: , Rfl:  .  atenolol (TENORMIN) 100 MG tablet, Take 1 tablet (100 mg total) by mouth daily., Disp: 60 tablet, Rfl: 0 .  guaiFENesin (MUCINEX) 600 MG 12 hr tablet, Take 600 mg by mouth daily as needed for to loosen phlegm., Disp: , Rfl:  .  hydrochlorothiazide (HYDRODIURIL) 25 MG tablet, Take 25 mg by mouth daily., Disp: , Rfl:  .  ibuprofen (ADVIL,MOTRIN) 200 MG tablet, Take 800 mg by mouth daily as needed for moderate pain., Disp: , Rfl:  .  metFORMIN (GLUCOPHAGE) 500 MG tablet, Take 500 mg by mouth once., Disp: , Rfl:  .  mycophenolate (CELLCEPT) 500 MG tablet, Take 500 mg by mouth 2 (two) times daily. 3 tablets (1500 mg) bid, Disp: , Rfl:  .  predniSONE (DELTASONE) 10 MG tablet, Take 10 mg by mouth daily with breakfast., Disp: , Rfl:  .  sulfamethoxazole-trimethoprim (BACTRIM DS,SEPTRA DS) 800-160 MG per tablet, Take 1 tablet by mouth every Monday, Wednesday, and Friday. , Disp: , Rfl:  .  Sulfamethoxazole-Trimethoprim (BACTRIM PO), Take by mouth., Disp: , Rfl:  .  verapamil (CALAN-SR) 120 MG CR tablet, Take 1 tablet (120 mg total) by mouth at bedtime., Disp: 30 tablet, Rfl:  0  Past Medical History: Past Medical History:  Diagnosis Date  . Hypertension     Tobacco Use: History  Smoking Status  . Never Smoker  Smokeless Tobacco  . Never Used    Comment: never smoked or used tobacco products    Labs: Recent Review Flowsheet Data    Labs for ITP Cardiac and Pulmonary Rehab Latest Ref Rng & Units 12/24/2008 12/27/2008   PHART 7.350 - 7.400 7.432(H) 7.366   PCO2ART 35.0 - 45.0 mmHg 40.2 45.6(H)   HCO3 20.0 - 24.0 mEq/L 26.3(H) 25.5(H)   TCO2 0 - 100 mmol/L 27.6 26.9   O2SAT % 93.4 97.5      Capillary Blood Glucose: Lab Results  Component Value Date   GLUCAP 113 (H) 06/23/2016   GLUCAP 150 (H) 06/23/2016   GLUCAP 92 12/30/2008   GLUCAP 169 (H) 12/29/2008   GLUCAP 210 (H) 12/29/2008       POCT Glucose    Row Name 06/23/16 1239 06/28/16 1212 06/30/16 1232 07/07/16 1244 07/12/16 1240     POCT Blood Glucose   Pre-Exercise 150 mg/dL 109 mg/dL 120 mg/dL 109 mg/dL 183 mg/dL   Post-Exercise 113 mg/dL 116 mg/dL -  unable to get blood, given graham cracker and peanut butter given 134 mg/dL 163 mg/dL   Row Name 07/14/16 1222 07/19/16 1209 07/26/16 1225 07/28/16 1244 08/02/16 1224     POCT Blood Glucose   Pre-Exercise 111 mg/dL  ate peanutbutter and cracker and  greek yogurt before exercise 109 mg/dL 109 mg/dL 119 mg/dL 119 mg/dL   Post-Exercise 135 mg/dL 126 mg/dL 122 mg/dL 125 mg/dL 119 mg/dL   Row Name 08/04/16 1242 08/09/16 1233 08/11/16 1238         POCT Blood Glucose   Pre-Exercise 119 mg/dL 119 mg/dL 110 mg/dL     Post-Exercise 139 mg/dL 136 mg/dL 133 mg/dL        ADL UCSD:     Pulmonary Assessment Scores    Row Name 06/22/16 1419         ADL UCSD   ADL Phase Entry     SOB Score total 65       CAT Score   CAT Score 25  Pre        Pulmonary Function Assessment:     Pulmonary Function Assessment - 05/30/16 1033      Breath   Bilateral Breath Sounds Clear   Shortness of Breath Yes;Limiting activity      Exercise  Target Goals:    Exercise Program Goal: Individual exercise prescription set with THRR, safety & activity barriers. Participant demonstrates ability to understand and report RPE using BORG scale, to self-measure pulse accurately, and to acknowledge the importance of the exercise prescription.  Exercise Prescription Goal: Starting with aerobic activity 30 plus minutes a day, 3 days per week for initial exercise prescription. Provide home exercise prescription and guidelines that participant acknowledges understanding prior to discharge.  Activity Barriers & Risk Stratification:     Activity Barriers & Cardiac Risk Stratification - 05/30/16 1038      Activity Barriers & Cardiac Risk Stratification   Activity Barriers None      6 Minute Walk:     6 Minute Walk    Row Name 06/17/16 0922         6 Minute Walk   Phase Initial     Distance 610 feet     Walk Time -  4 minutes 55 seconds     # of Rest Breaks 2  1st: 45 seconds 2nd: 10 seconds     MPH 1.15     METS 1.84     RPE 12     Perceived Dyspnea  3     Symptoms No     Resting HR 84 bpm     Resting BP 138/88     Max Ex. HR 113 bpm     Max Ex. BP 142/90       Interval HR   Baseline HR 84     1 Minute HR 103     2 Minute HR 113     3 Minute HR 97     4 Minute HR 99     5 Minute HR 107     6 Minute HR 111     2 Minute Post HR 86     Interval Heart Rate? Yes       Interval Oxygen   Interval Oxygen? Yes     Baseline Oxygen Saturation % 99 %     Baseline Liters of Oxygen 15 L     1 Minute Oxygen Saturation % 92 %     1 Minute Liters of Oxygen 15 L     2 Minute Oxygen Saturation % 80 %     2 Minute Liters of Oxygen 15 L     3 Minute Oxygen Saturation % 98 %  break 2:05     3 Minute Liters of Oxygen 15 L  4 Minute Oxygen Saturation % 95 %     4 Minute Liters of Oxygen 15 L     5 Minute Oxygen Saturation % 88 %  break 5:40     5 Minute Liters of Oxygen 15 L     6 Minute Oxygen Saturation % 82 %     6  Minute Liters of Oxygen 15 L     2 Minute Post Oxygen Saturation % 94 %     2 Minute Post Liters of Oxygen 15 L        Oxygen Initial Assessment:     Oxygen Initial Assessment - 06/17/16 0920      Initial 6 min Walk   Oxygen Used Continuous;E-Tanks   Liters per minute 15   Resting Oxygen Saturation  during 6 min walk 99 %   Exercise Oxygen Saturation  during 6 min walk 80 %  rest break initiated by staff     Program Oxygen Prescription   Program Oxygen Prescription Continuous;E-Tanks   Liters per minute 15   Comments Patient will bring oximizer with her to rehab-this may help with discomfort of high liter flow and low oxygen saturations.      Oxygen Re-Evaluation:     Oxygen Re-Evaluation    Row Name 07/12/16 0943             Program Oxygen Prescription   Program Oxygen Prescription Continuous;E-Tanks       Liters per minute 15       Comments using home oximizer but requires an increase in her liter flow to maintain saturations >88 with exertion. Order obtained for NRB or Venti mask at 25 liters         Sesser Concentrator;E-Tanks       Sleep Oxygen Prescription Continuous       Liters per minute 6       Home Exercise Oxygen Prescription Continuous       Liters per minute 15       Home at Rest Exercise Oxygen Prescription Continuous       Liters per minute 6       Compliance with Home Oxygen Use Yes         Goals/Expected Outcomes   Short Term Goals To learn and exhibit compliance with exercise, home and travel O2 prescription;To learn and understand importance of monitoring SPO2 with pulse oximeter and demonstrate accurate use of the pulse oximeter.;To Learn and understand importance of maintaining oxygen saturations>88%;To learn and demonstrate proper purse lipped breathing techniques or other breathing techniques.       Long  Term Goals Exhibits compliance with exercise, home and travel O2 prescription;Verbalizes importance of  monitoring SPO2 with pulse oximeter and return demonstration;Maintenance of O2 saturations>88%;Exhibits proper breathing techniques, such as purse lipped breathing or other method taught during program session;Compliance with respiratory medication       Comments patient able to adjust oxygen flow at pulmonary rehab based on her self monitoring of oxygen saturations.          Oxygen Discharge (Final Oxygen Re-Evaluation):     Oxygen Re-Evaluation - 07/12/16 0943      Program Oxygen Prescription   Program Oxygen Prescription Continuous;E-Tanks   Liters per minute 15   Comments using home oximizer but requires an increase in her liter flow to maintain saturations >88 with exertion. Order obtained for NRB or Venti mask at 25 liters     Home Oxygen  Home Oxygen Device Home Concentrator;E-Tanks   Sleep Oxygen Prescription Continuous   Liters per minute 6   Home Exercise Oxygen Prescription Continuous   Liters per minute 15   Home at Rest Exercise Oxygen Prescription Continuous   Liters per minute 6   Compliance with Home Oxygen Use Yes     Goals/Expected Outcomes   Short Term Goals To learn and exhibit compliance with exercise, home and travel O2 prescription;To learn and understand importance of monitoring SPO2 with pulse oximeter and demonstrate accurate use of the pulse oximeter.;To Learn and understand importance of maintaining oxygen saturations>88%;To learn and demonstrate proper purse lipped breathing techniques or other breathing techniques.   Long  Term Goals Exhibits compliance with exercise, home and travel O2 prescription;Verbalizes importance of monitoring SPO2 with pulse oximeter and return demonstration;Maintenance of O2 saturations>88%;Exhibits proper breathing techniques, such as purse lipped breathing or other method taught during program session;Compliance with respiratory medication   Comments patient able to adjust oxygen flow at pulmonary rehab based on her self  monitoring of oxygen saturations.      Initial Exercise Prescription:     Initial Exercise Prescription - 06/17/16 0900      Date of Initial Exercise RX and Referring Provider   Date 06/17/16   Referring Provider Dr. Nelda Marseille  Dr. [name] ([facility])     Oxygen   Oxygen Continuous   Liters 15     Recumbant Bike   Level 2   Minutes 15     NuStep   Level 2   Minutes 17   METs 1.5     Track   Laps 5   Minutes 17     Prescription Details   Frequency (times per week) 2   Duration Progress to 45 minutes of aerobic exercise without signs/symptoms of physical distress     Intensity   THRR 40-80% of Max Heartrate 70-139   Ratings of Perceived Exertion 11-13   Perceived Dyspnea 0-4     Progression   Progression Continue progressive overload as per policy without signs/symptoms or physical distress.     Resistance Training   Training Prescription Yes   Weight ORANGE BANDS   Reps 10-15      Perform Capillary Blood Glucose checks as needed.  Exercise Prescription Changes:     Exercise Prescription Changes    Row Name 06/23/16 1200 06/28/16 1200 06/30/16 1200 07/05/16 1200 07/07/16 1242     Response to Exercise   Blood Pressure (Admit) 106/80 115/77 120/80 116/82 116/60   Blood Pressure (Exercise) 110/70 126/80 140/70 130/86 142/94   Blood Pressure (Exit) 124/94 108/70 118/54 126/70 114/74   Heart Rate (Admit) 105 bpm 84 bpm 80 bpm 89 bpm 78 bpm   Heart Rate (Exercise) 118 bpm 95 bpm 86 bpm 101 bpm 110 bpm   Heart Rate (Exit) 86 bpm 82 bpm 77 bpm 85 bpm 78 bpm   Oxygen Saturation (Admit) 88 % 100 % 100 % 98 % 100 %   Oxygen Saturation (Exercise) 88 % 85 % 83 % 84 % 79 %   Oxygen Saturation (Exit) 100 % 99 % 99 % 99 % 78 %   Rating of Perceived Exertion (Exercise) _0 Perceived Dyspnea (Exercise) 0._1 Duration Progress to 45 minutes of aerobic exercise without signs/symptoms of physical distress Progress to 45 minutes of aerobic exercise  without signs/symptoms of physical distress Progress to 45 minutes of aerobic exercise without signs/symptoms  of physical distress Progress to 45 minutes of aerobic exercise without signs/symptoms of physical distress Progress to 45 minutes of aerobic exercise without signs/symptoms of physical distress   Intensity _0      Progression   Progression Continue to progress workloads to maintain intensity without signs/symptoms of physical distress. Continue to progress workloads to maintain intensity without signs/symptoms of physical distress. Continue to progress workloads to maintain intensity without signs/symptoms of physical distress. Continue to progress workloads to maintain intensity without signs/symptoms of physical distress. Continue to progress workloads to maintain intensity without signs/symptoms of physical distress.     Resistance Training   Training Prescription _1    Weight _2    Reps 10-15 10-15 10-15 10-15 10-15   Time  -  - 10 Minutes  10 minutes 10 Minutes 10 Minutes     Interval Training   Interval Training  -  - No No No     Oxygen   Oxygen _3    Liters 15 15 15-25 L 15-25 L 15-25 L     Recumbant Bike   Level _4 -   Minutes _5 -     NuStep   Level _6 Minutes _7 METs 1.5 1.4 1.6 1.6  -     Track   Laps  - 4  - 6 6   Minutes  - 17  - 17 57   Row Name 07/12/16 1200 07/14/16 1200 07/19/16 1200 07/21/16 1247 07/26/16 1222     Response to Exercise   Blood Pressure (Admit) 116/72 106/76 106/72 122/72 118/76   Blood Pressure (Exercise) 110/82 132/82 144/90 128/80 124/76   Blood Pressure (Exit) 120/80 114/70 116/86 130/82 122/82   Heart Rate (Admit) 87 bpm 73 bpm 76 bpm 79 bpm 82 bpm   Heart Rate (Exercise) 107 bpm 114 bpm 105 bpm  91 bpm 114 bpm   Heart Rate (Exit) 96 bpm 86 bpm 86 bpm 74 bpm 90 bpm   Oxygen Saturation (Admit) 99 % 100 % 100 % 99 % 100 %   Oxygen Saturation (Exercise) 92 % 100 % 93 % 96 % 88 %   Oxygen Saturation (Exit) 99 % 99 % 96 % 98 % 100 %   Rating of Perceived Exertion (Exercise) _8 Perceived Dyspnea (Exercise) 0 0 0 0 2   Duration Progress to 45 minutes of aerobic exercise without signs/symptoms of physical distress Progress to 45 minutes of aerobic exercise without signs/symptoms of physical distress Progress to 45 minutes of aerobic exercise without signs/symptoms of physical distress Progress to 45 minutes of aerobic exercise without signs/symptoms of physical distress Progress to 45 minutes of aerobic exercise without signs/symptoms of physical distress   Intensity _9      Progression   Progression Continue to progress workloads to maintain intensity without signs/symptoms of physical distress. Continue to progress workloads to maintain intensity without signs/symptoms of physical distress. Continue to progress workloads to maintain intensity without signs/symptoms of physical distress. Continue to progress workloads to maintain intensity without signs/symptoms of physical distress. Continue to progress workloads to maintain intensity without signs/symptoms of physical distress.     Resistance Training   Training Prescription Yes Yes  Yes Yes Yes   Weight _0    Reps 10-15 10-15 10-15 10-15 10-15   Time 10 Minutes 10 Minutes 10 Minutes 10 Minutes 10 Minutes     Interval Training   Interval Training _1      Oxygen   Oxygen _2    Liters 15-25  Used nonrebreather mask today 15L  Only had to use 15L today with nonrebreather mask 15L  Only had to use 15L today with nonrebreather mask 15L   Only had to use 15L today with nonrebreather mask 15-25L     Treadmill   MPH  -  -  -  - 0.7   Minutes  -  -  -  - 17     Recumbant Bike   Level _3 - 2   Minutes _4 - 17     NuStep   Level _5 Minutes _6 METs 1.8 1.9 1.8 2 1.9     Track   Laps _7 -   Minutes _8 -   Row Name 07/28/16 1238 08/02/16 1200 08/04/16 1200 08/09/16 1200 08/11/16 1236     Response to Exercise   Blood Pressure (Admit) 122/76 120/80 124/88 104/60 116/70   Blood Pressure (Exercise) 132/70 110/84 124/84 124/84 116/80   Blood Pressure (Exit) 122/74 104/60 104/70 104/60 100/50   Heart Rate (Admit) 82 bpm 77 bpm 82 bpm 78 bpm 82 bpm   Heart Rate (Exercise) 102 bpm 86 bpm 97 bpm 127 bpm 100 bpm   Heart Rate (Exit) 87 bpm 75 bpm 76 bpm 70 bpm 85 bpm   Oxygen Saturation (Admit) 99 % 100 % 89 % 100 % 100 %   Oxygen Saturation (Exercise) 93 % 99 % 94 % 93 % 95 %   Oxygen Saturation (Exit) 99 % 100 % 100 % 100 % 100 %   Rating of Perceived Exertion (Exercise) _9 Perceived Dyspnea (Exercise) 0 0 0 0 0   Duration Progress to 45 minutes of aerobic exercise without signs/symptoms of physical distress Progress to 45 minutes of aerobic exercise without signs/symptoms of physical distress Progress to 45 minutes of aerobic exercise without signs/symptoms of physical distress Progress to 45 minutes of aerobic exercise without signs/symptoms of physical distress Progress to 45 minutes of aerobic exercise without signs/symptoms of physical distress   Intensity _10      Progression   Progression Continue to progress workloads to maintain intensity without signs/symptoms of physical distress. Continue to progress workloads to maintain intensity without signs/symptoms of physical distress. Continue to progress workloads to maintain intensity without signs/symptoms of physical distress. Continue to  progress workloads to maintain intensity without signs/symptoms of physical distress. Continue to progress workloads to maintain intensity without signs/symptoms of physical distress.     Resistance Training   Training Prescription _11    Weight _12    Reps 10-15 10-15 10-15 10-15 10-15   Time 10 Minutes 10 Minutes 10 Minutes 10 Minutes 10 Minutes     Interval Training   Interval Training _13      Oxygen   Oxygen _14   Liters 15-25L _0 15-25     Treadmill   MPH  -  -  - 1  -   Grade  -  -  - 2  -   Minutes  -  -  - 17  -     Recumbant Bike   Level 2  -  - 2.1 2.5   Minutes 17  -  - 17 17     NuStep   Level _1 Minutes 17 51  workloads decreased today d/t exacerbation 34  workloads decreased today d/t exacerbation 17  workloads decreased today d/t exacerbation 17   METs 1.9 1._2 Exercise Comments:   Exercise Goals and Review:   Exercise Goals Re-Evaluation :     Exercise Goals Re-Evaluation    Row Name 07/11/16 1103 08/09/16 1607           Exercise Goal Re-Evaluation   Exercise Goals Review Increase Physical Activity;Increase Strenth and Stamina Increase Strenth and Stamina;Increase Physical Activity      Comments Patient has attended 5 sessions. She is on high liter flow (15 liters) and still desaturates into the 80's. Due to desaturation and being deconditioned she is averaging 1.4-1.6 METs. We are waiting on approval for additional guidlines on liter flow and oxygen saturation parameters. Will cont to monitor and progress as appropriate.  Patient averages MET levels of 1.8-2.0 which is an improvement. She has advanced to the treadmill. Currently on 25 liters during exercise. Tires easily. Rates her exertion at a fairly light to somewhat hard. Will cont. to monitor and progress as able.       Expected  Outcomes Through exercise at rehab and at home patient will increase physical capacity, strength, and stamina.  Through exercise at rehab and at home patient will increase physical capacity, strength, and stamina.          Discharge Exercise Prescription (Final Exercise Prescription Changes):     Exercise Prescription Changes - 08/11/16 1236      Response to Exercise   Blood Pressure (Admit) 116/70   Blood Pressure (Exercise) 116/80   Blood Pressure (Exit) 100/50   Heart Rate (Admit) 82 bpm   Heart Rate (Exercise) 100 bpm   Heart Rate (Exit) 85 bpm   Oxygen Saturation (Admit) 100 %   Oxygen Saturation (Exercise) 95 %   Oxygen Saturation (Exit) 100 %   Rating of Perceived Exertion (Exercise) 12   Perceived Dyspnea (Exercise) 0   Duration Progress to 45 minutes of aerobic exercise without signs/symptoms of physical distress   Intensity THRR unchanged     Progression   Progression Continue to progress workloads to maintain intensity without signs/symptoms of physical distress.     Resistance Training   Training Prescription Yes   Weight orange bands   Reps 10-15   Time 10 Minutes     Interval Training   Interval Training No     Oxygen   Oxygen Continuous   Liters 15-25     Recumbant Bike   Level 2.5   Minutes 17     NuStep   Level 5   Minutes 17   METs 2      Nutrition:  Target Goals: Understanding of nutrition guidelines, daily intake of sodium <1541m, cholesterol <2010m calories 30% from fat and 7% or less from saturated fats, daily to have 5 or more servings of fruits and vegetables.  Biometrics:  Pre Biometrics - 05/30/16 1042      Pre Biometrics   Grip Strength 25 kg       Nutrition Therapy Plan and Nutrition Goals:     Nutrition Therapy & Goals - 08/11/16 1351      Nutrition Therapy   Diet Carb Modified, Therapeutic Lifestyle Changes     Personal Nutrition Goals   Nutrition Goal Wt loss of 1-2 lb/week to a wt loss goal of 6-24 lb at  graduation from Pulmonary Rehab.     Intervention Plan   Intervention Prescribe, educate and counsel regarding individualized specific dietary modifications aiming towards targeted core components such as weight, hypertension, lipid management, diabetes, heart failure and other comorbidities.   Expected Outcomes Short Term Goal: Understand basic principles of dietary content, such as calories, fat, sodium, cholesterol and nutrients.;Long Term Goal: Adherence to prescribed nutrition plan.      Nutrition Discharge: Rate Your Plate Scores:     Nutrition Assessments - 08/11/16 1351      Rate Your Plate Scores   Pre Score 54      Nutrition Goals Re-Evaluation:   Nutrition Goals Discharge (Final Nutrition Goals Re-Evaluation):   Psychosocial: Target Goals: Acknowledge presence or absence of significant depression and/or stress, maximize coping skills, provide positive support system. Participant is able to verbalize types and ability to use techniques and skills needed for reducing stress and depression.  Initial Review & Psychosocial Screening:     Initial Psych Review & Screening - 05/30/16 1046      Initial Review   Current issues with None Identified     Family Dynamics   Good Support System? Yes     Barriers   Psychosocial barriers to participate in program There are no identifiable barriers or psychosocial needs.     Screening Interventions   Interventions Encouraged to exercise      Quality of Life Scores:   PHQ-9: Recent Review Flowsheet Data    Depression screen [facility] 2/9 05/30/2016 09/23/2014   Decreased Interest 0 0   Down, Depressed, Hopeless 0 0   PHQ - 2 Score 0 0     Interpretation of Total Score  Total Score Depression Severity:  1-4 = Minimal depression, 5-9 = Mild depression, 10-14 = Moderate depression, 15-19 = Moderately severe depression, 20-27 = Severe depression   Psychosocial Evaluation and Intervention:     Psychosocial Evaluation -  05/30/16 1047      Psychosocial Evaluation & Interventions   Interventions Encouraged to exercise with the program and follow exercise prescription   Continue Psychosocial Services  No Follow up required      Psychosocial Re-Evaluation:     Psychosocial Re-Evaluation    [place] Name 07/12/16 0947 08/09/16 1600           Psychosocial Re-Evaluation   Current issues with Current Stress Concerns Current Stress Concerns      Expected Outcomes  - patiet will remain free from psychosocial barriers and/or identify ways to decrease stress in her life. she has refused a therapist referral.      Interventions Encouraged to attend Pulmonary Rehabilitation for the exercise  will refer to therapist or counselor as indicated  -      Continue Psychosocial Services  Follow up required by staff  -      Comments progression of her disease  -        Initial Review   Source of Stress Concerns Chronic Illness  -  Psychosocial Discharge (Final Psychosocial Re-Evaluation):     Psychosocial Re-Evaluation - 08/09/16 1600      Psychosocial Re-Evaluation   Current issues with Current Stress Concerns   Expected Outcomes patiet will remain free from psychosocial barriers and/or identify ways to decrease stress in her life. she has refused a therapist referral.      Education: Education Goals: Education classes will be provided on a weekly basis, covering required topics. Participant will state understanding/return demonstration of topics presented.  Learning Barriers/Preferences:     Learning Barriers/Preferences - 05/30/16 1032      Learning Barriers/Preferences   Learning Barriers None   Learning Preferences Pictoral;Video      Education Topics: Risk Factor Reduction:  -Group instruction that is supported by a PowerPoint presentation. Instructor discusses the definition of a risk factor, different risk factors for pulmonary disease, and how the heart and lungs work together.      Nutrition for Pulmonary Patient:  -Group instruction provided by PowerPoint slides, verbal discussion, and written materials to support subject matter. The instructor gives an explanation and review of healthy diet recommendations, which includes a discussion on weight management, recommendations for fruit and vegetable consumption, as well as protein, fluid, caffeine, fiber, sodium, sugar, and alcohol. Tips for eating when patients are short of breath are discussed.   PULMONARY REHAB OTHER RESPIRATORY from 08/11/2016 in [place]  Date  07/28/16  Educator  [facility]  Instruction Review Code  2- meets goals/outcomes      Pursed Lip Breathing:  -Group instruction that is supported by demonstration and informational handouts. Instructor discusses the benefits of pursed lip and diaphragmatic breathing and detailed demonstration on how to preform both.     Oxygen Safety:  -Group instruction provided by PowerPoint, verbal discussion, and written material to support subject matter. There is an overview of "What is Oxygen" and "Why do we need it".  Instructor also reviews how to create a safe environment for oxygen use, the importance of using oxygen as prescribed, and the risks of noncompliance. There is a brief discussion on traveling with oxygen and resources the patient may utilize.   PULMONARY REHAB OTHER RESPIRATORY from 08/11/2016 in [place]  Date  07/21/16  Educator  Danniell Rotundo  Instruction Review Code  2- meets goals/outcomes      Oxygen Equipment:  -Group instruction provided by [employer] Staff utilizing handouts, written materials, and equipment demonstrations.   PULMONARY REHAB OTHER RESPIRATORY from 08/11/2016 in [place]  Date  06/30/16  Educator  Ace Gins  Instruction Review Code  2- meets goals/outcomes      Signs and Symptoms:  -Group instruction provided by written material and  verbal discussion to support subject matter. Warning signs and symptoms of infection, stroke, and heart attack are reviewed and when to call the physician/911 reinforced. Tips for preventing the spread of infection discussed.   Advanced Directives:  -Group instruction provided by verbal instruction and written material to support subject matter. Instructor reviews Advanced Directive laws and proper instruction for filling out document.   Pulmonary Video:  -Group video education that reviews the importance of medication and oxygen compliance, exercise, good nutrition, pulmonary hygiene, and pursed lip and diaphragmatic breathing for the pulmonary patient.   Exercise for the Pulmonary Patient:  -Group instruction that is supported by a PowerPoint presentation. Instructor discusses benefits of exercise, core components of exercise, frequency, duration, and intensity of an exercise routine, importance of  utilizing pulse oximetry during exercise, safety while exercising, and options of places to exercise outside of rehab.     PULMONARY REHAB OTHER RESPIRATORY from 08/11/2016 in [place]  Date  08/04/16  Educator  MD  Instruction Review Code  2- meets goals/outcomes      Pulmonary Medications:  -Verbally interactive group education provided by instructor with focus on inhaled medications and proper administration.   PULMONARY REHAB OTHER RESPIRATORY from 08/11/2016 in [place]  Date  06/23/16  Educator  Pharmacy  Instruction Review Code  2- meets goals/outcomes      Anatomy and Physiology of the Respiratory System and Intimacy:  -Group instruction provided by PowerPoint, verbal discussion, and written material to support subject matter. Instructor reviews respiratory cycle and anatomical components of the respiratory system and their functions. Instructor also reviews differences in obstructive and restrictive respiratory  diseases with examples of each. Intimacy, Sex, and Sexuality differences are reviewed with a discussion on how relationships can change when diagnosed with pulmonary disease. Common sexual concerns are reviewed.   MD DAY -A group question and answer session with a medical doctor that allows participants to ask questions that relate to their pulmonary disease state.   PULMONARY REHAB OTHER RESPIRATORY from 08/11/2016 in [place]  Date  08/11/16  Educator  yacoub  Instruction Review Code  2- meets goals/outcomes      OTHER EDUCATION -Group or individual verbal, written, or video instructions that support the educational goals of the pulmonary rehab program.   Knowledge Questionnaire Score:     Knowledge Questionnaire Score - 06/22/16 1419      Knowledge Questionnaire Score   Pre Score 10/13      Core Components/Risk Factors/Patient Goals at Admission:     Personal Goals and Risk Factors at Admission - 05/30/16 1043      Core Components/Risk Factors/Patient Goals on Admission    Weight Management Weight Loss;Obesity   Improve shortness of breath with ADL's Yes   Intervention Provide education, individualized exercise plan and daily activity instruction to help decrease symptoms of SOB with activities of daily living.   Expected Outcomes Short Term: Achieves a reduction of symptoms when performing activities of daily living.   Increase knowledge of respiratory medications and ability to use respiratory devices properly  Yes   Intervention Provide education and demonstration as needed of appropriate use of medications, inhalers, and oxygen therapy.   Expected Outcomes Short Term: Achieves understanding of medications use. Understands that oxygen is a medication prescribed by physician. Demonstrates appropriate use of inhaler and oxygen therapy.      Core Components/Risk Factors/Patient Goals Review:      Goals and Risk Factor Review    Row Name  07/12/16 0946 08/09/16 1555           Core Components/Risk Factors/Patient Goals Review   Personal Goals Review Weight Management/Obesity;Develop more efficient breathing techniques such as purse lipped breathing and diaphragmatic breathing and practicing self-pacing with activity.;Improve shortness of breath with ADL's;Increase knowledge of respiratory medications and ability to use respiratory devices properly.;Stress Weight Management/Obesity;Develop more efficient breathing techniques such as purse lipped breathing and diaphragmatic breathing and practicing self-pacing with activity.;Improve shortness of breath with ADL's;Increase knowledge of respiratory medications and ability to use respiratory devices properly.;Stress      Review see comment section on ITP patient has slowly began to loose weight. she is doing very well in pulmonary rehab and works  really hard. She is limited by her oxygen consumption and has to take many restbreaks as 25L with a NRB is barely encugh to keep her sats >90 with moderate exertion. she does a great job of self pacing and self monitoring via pulse oximeter. She states she does see some improvement in her shortness of breath however per her recent visit to her pulmonologist at [facility], the patients symptoms are progressing and is set up with Cytoxan infusion at the end of july. will continue to monitor patient closely and follow up on her goals/core components.      Expected Outcomes see admission expected outcomes see admission expected outcomes         Core Components/Risk Factors/Patient Goals at Discharge (Final Review):      Goals and Risk Factor Review - 08/09/16 1555      Core Components/Risk Factors/Patient Goals Review   Personal Goals Review Weight Management/Obesity;Develop more efficient breathing techniques such as purse lipped breathing and diaphragmatic breathing and practicing self-pacing with activity.;Improve shortness of breath with ADL's;Increase  knowledge of respiratory medications and ability to use respiratory devices properly.;Stress   Review patient has slowly began to loose weight. she is doing very well in pulmonary rehab and works really hard. She is limited by her oxygen consumption and has to take many restbreaks as 25L with a NRB is barely encugh to keep her sats >90 with moderate exertion. she does a great job of self pacing and self monitoring via pulse oximeter. She states she does see some improvement in her shortness of breath however per her recent visit to her pulmonologist at [facility], the patients symptoms are progressing and is set up with Cytoxan infusion at the end of july. will continue to monitor patient closely and follow up on her goals/core components.   Expected Outcomes see admission expected outcomes      ITP Comments:   Comments: ITP REVIEW Pt is making expected progress toward pulmonary rehab goals after completing 15 sessions. Recommend continued exercise, life style modification, education, and utilization of breathing techniques to increase stamina and strength and decrease shortness of breath with exertion.

## 2016-08-11 NOTE — Progress Notes (Signed)
Daily Session Note  Patient Details  Name: Felicia [name] MRN: [id number] Date of Birth: 04-26-1970 Referring Provider:     Pulmonary Rehab Walk Test from 06/16/2016 in [place]  Referring Provider  Dr. Nelda Marseille [Dr. [name] ([facility])]      Encounter Date: 08/11/2016  Check In:     Session Check In - 08/11/16 1030      Check-In   Location [facility]   Staff Present Trish Fountain, RN, Maxcine Ham, RN, BSN;Molly diVincenzo, MS, ACSM RCEP, Exercise Physiologist;Lisa Ysidro Evert, RN   Supervising physician immediately available to respond to emergencies [facility] immediately available   Physician(s) Dr. Wendee Beavers   Medication changes reported     No   Fall or balance concerns reported    No   Tobacco Cessation No Change   Warm-up and Cool-down Performed as group-led instruction   Resistance Training Performed Yes   VAD Patient? No     Pain Assessment   Currently in Pain? No/denies   Multiple Pain Sites No      Capillary Blood Glucose: No results found for this or any previous visit (from the past 24 hour(s)).     POCT Glucose - 08/11/16 1238      POCT Blood Glucose   Pre-Exercise 110 mg/dL   Post-Exercise 133 mg/dL         Exercise Prescription Changes - 08/11/16 1236      Response to Exercise   Blood Pressure (Admit) 116/70   Blood Pressure (Exercise) 116/80   Blood Pressure (Exit) 100/50   Heart Rate (Admit) 82 bpm   Heart Rate (Exercise) 100 bpm   Heart Rate (Exit) 85 bpm   Oxygen Saturation (Admit) 100 %   Oxygen Saturation (Exercise) 95 %   Oxygen Saturation (Exit) 100 %   Rating of Perceived Exertion (Exercise) 12   Perceived Dyspnea (Exercise) 0   Duration Progress to 45 minutes of aerobic exercise without signs/symptoms of physical distress   Intensity THRR unchanged     Progression   Progression Continue to progress workloads to maintain intensity without signs/symptoms of physical distress.      Resistance Training   Training Prescription Yes   Weight orange bands   Reps 10-15   Time 10 Minutes     Interval Training   Interval Training No     Oxygen   Oxygen Continuous   Liters 15-25     Recumbant Bike   Level 2.5   Minutes 17     NuStep   Level 5   Minutes 17   METs 2      History  Smoking Status  . Never Smoker  Smokeless Tobacco  . Never Used    Comment: never smoked or used tobacco products    Goals Met:  Independence with exercise equipment Improved SOB with ADL's Using PLB without cueing & demonstrates good technique Exercise tolerated well No report of cardiac concerns or symptoms Strength training completed today  Goals Unmet:  Not Applicable  Comments: Service time is from 1030 to 1210   Dr. Rush Farmer is Medical Director for Pulmonary Rehab at [name].

## 2016-08-16 ENCOUNTER — Encounter (HOSPITAL_COMMUNITY)
Admission: RE | Admit: 2016-08-16 | Discharge: 2016-08-16 | Disposition: A | Discharge status: Home / Self Care | Payer: 59 | Source: Ambulatory Visit | Attending: Pulmonary Disease | Admitting: Pulmonary Disease

## 2016-08-16 VITALS — Wt 242.9 lb

## 2016-08-16 DIAGNOSIS — J84113 Idiopathic non-specific interstitial pneumonitis: Secondary | ICD-10-CM

## 2016-08-16 NOTE — Progress Notes (Signed)
Daily Session Note  Patient Details  Name: Felicia [name] MRN: [id number] Date of Birth: Oct 05, 1970 Referring Provider:     Pulmonary Rehab Walk Test from 06/16/2016 in [place]  Referring Provider  Dr. Nelda Marseille [Dr. [name] ([facility])]      Encounter Date: 08/16/2016  Check In:     Session Check In - 08/16/16 1027      Check-In   Location [facility]   Staff Present Rosebud Poles, RN, BSN;Lisa Ysidro Evert, RN;Portia Rollene Rotunda, RN, BSN   Supervising physician immediately available to respond to emergencies [facility] immediately available   Physician(s) Dr. Doyle Askew   Medication changes reported     No   Fall or balance concerns reported    No   Tobacco Cessation No Change   Warm-up and Cool-down Performed as group-led instruction   Resistance Training Performed Yes   VAD Patient? No     Pain Assessment   Currently in Pain? No/denies   Multiple Pain Sites No      Capillary Blood Glucose: No results found for this or any previous visit (from the past 24 hour(s)).     POCT Glucose - 08/16/16 1241      POCT Blood Glucose   Pre-Exercise 119 mg/dL   Post-Exercise 149 mg/dL         Exercise Prescription Changes - 08/16/16 1200      Response to Exercise   Blood Pressure (Admit) 122/86   Blood Pressure (Exercise) 124/66   Blood Pressure (Exit) 120/70   Heart Rate (Admit) 84 bpm   Heart Rate (Exercise) 111 bpm   Heart Rate (Exit) 93 bpm   Oxygen Saturation (Admit) 100 %   Oxygen Saturation (Exercise) 91 %   Oxygen Saturation (Exit) 93 %   Rating of Perceived Exertion (Exercise) 12   Perceived Dyspnea (Exercise) 1   Duration Progress to 45 minutes of aerobic exercise without signs/symptoms of physical distress   Intensity THRR unchanged     Progression   Progression Continue to progress workloads to maintain intensity without signs/symptoms of physical distress.     Resistance Training   Training Prescription  Yes   Weight orange bands   Reps 10-15   Time 10 Minutes     Interval Training   Interval Training No     Oxygen   Oxygen Continuous   Liters 15-25     Treadmill   MPH 1   Grade 2   Minutes 17     NuStep   Level 5   Minutes 17   METs 1.8      History  Smoking Status  . Never Smoker  Smokeless Tobacco  . Never Used    Comment: never smoked or used tobacco products    Goals Met:  Exercise tolerated well Strength training completed today  Goals Unmet:  Not Applicable  Comments: Service time is from 1030 to 1210    Dr. Rush Farmer is Medical Director for Pulmonary Rehab at [facility].

## 2016-08-18 ENCOUNTER — Encounter (HOSPITAL_COMMUNITY)
Admission: RE | Admit: 2016-08-18 | Discharge: 2016-08-18 | Disposition: A | Discharge status: Home / Self Care | Payer: 59 | Source: Ambulatory Visit | Attending: Pulmonary Disease | Admitting: Pulmonary Disease

## 2016-08-18 VITALS — Wt 243.8 lb

## 2016-08-18 DIAGNOSIS — J84113 Idiopathic non-specific interstitial pneumonitis: Secondary | ICD-10-CM | POA: Diagnosis not present

## 2016-08-18 NOTE — Progress Notes (Signed)
Daily Session Note  Patient Details  Name: Felicia [name] MRN: [id number] Date of Birth: 10/21/1970 Referring Provider:     Pulmonary Rehab Walk Test from 06/16/2016 in [place]  Referring Provider  Dr. Nelda Marseille [Dr. [name] ([facility])]      Encounter Date: 08/18/2016  Check In:     Session Check In - 08/18/16 1030      Check-In   Location [facility]   Staff Present Rosebud Poles, RN, BSN;Defne Gerling Ysidro Evert, RN;Portia Rollene Rotunda, RN, BSN   Supervising physician immediately available to respond to emergencies [facility] immediately available   Physician(s) Dr. Maryland Pink   Medication changes reported     No   Fall or balance concerns reported    No   Tobacco Cessation No Change   Warm-up and Cool-down Performed as group-led instruction   Resistance Training Performed Yes   VAD Patient? No     Pain Assessment   Currently in Pain? No/denies   Multiple Pain Sites No      Capillary Blood Glucose: No results found for this or any previous visit (from the past 24 hour(s)).     POCT Glucose - 08/18/16 1235      POCT Blood Glucose   Pre-Exercise 112 mg/dL   Post-Exercise 152 mg/dL         Exercise Prescription Changes - 08/18/16 1200      Response to Exercise   Blood Pressure (Admit) 102/72   Blood Pressure (Exercise) 134/80   Blood Pressure (Exit) 118/82   Heart Rate (Admit) 75 bpm   Heart Rate (Exercise) 95 bpm   Heart Rate (Exit) 77 bpm   Oxygen Saturation (Admit) 100 %   Oxygen Saturation (Exercise) 88 %   Oxygen Saturation (Exit) 100 %   Rating of Perceived Exertion (Exercise) 12   Perceived Dyspnea (Exercise) 1   Duration Progress to 45 minutes of aerobic exercise without signs/symptoms of physical distress   Intensity THRR unchanged     Progression   Progression Continue to progress workloads to maintain intensity without signs/symptoms of physical distress.     Resistance Training   Training Prescription  Yes   Weight orange bands   Reps 10-15   Time 10 Minutes     Interval Training   Interval Training No     Oxygen   Oxygen Continuous   Liters 15-25     Treadmill   MPH 1   Grade 0   Minutes 17     NuStep   Level 5   Minutes 17   METs 1.8      History  Smoking Status  . Never Smoker  Smokeless Tobacco  . Never Used    Comment: never smoked or used tobacco products    Goals Met:  Exercise tolerated well No report of cardiac concerns or symptoms Strength training completed today  Goals Unmet:  Not Applicable  Comments: Service time is from 1030 to 1215    Dr. Rush Farmer is Medical Director for Pulmonary Rehab at [name].

## 2016-08-23 ENCOUNTER — Encounter (HOSPITAL_COMMUNITY): Admission: RE | Admit: 2016-08-23 | Payer: 59 | Source: Ambulatory Visit

## 2016-08-25 ENCOUNTER — Encounter (HOSPITAL_COMMUNITY)
Admission: RE | Admit: 2016-08-25 | Discharge: 2016-08-25 | Disposition: A | Discharge status: Home / Self Care | Payer: 59 | Source: Ambulatory Visit | Attending: Pulmonary Disease | Admitting: Pulmonary Disease

## 2016-08-25 VITALS — Wt 242.5 lb

## 2016-08-25 DIAGNOSIS — J84113 Idiopathic non-specific interstitial pneumonitis: Secondary | ICD-10-CM | POA: Diagnosis not present

## 2016-08-25 NOTE — Progress Notes (Signed)
Daily Session Note  Patient Details  Name: Felicia [name] MRN: [id number] Date of Birth: 06/27/1970 Referring Provider:     Pulmonary Rehab Walk Test from 06/16/2016 in [place]  Referring Provider  Dr. Nelda Marseille [Dr. [name] ([facility])]      Encounter Date: 08/25/2016  Check In:     Session Check In - 08/25/16 1058      Check-In   Location [facility]   Staff Present Su Hilt, MS, ACSM RCEP, Exercise Physiologist;Joan Leonia Reeves, RN, BSN;Alfred Harrel Ysidro Evert, RN;Portia Rollene Rotunda, RN, BSN   Supervising physician immediately available to respond to emergencies [facility] immediately available   Physician(s) Dr. Burnis Medin   Medication changes reported     No   Fall or balance concerns reported    No   Tobacco Cessation No Change   Warm-up and Cool-down Performed as group-led instruction   Resistance Training Performed Yes   VAD Patient? No     Pain Assessment   Currently in Pain? No/denies   Multiple Pain Sites No      Capillary Blood Glucose: No results found for this or any previous visit (from the past 24 hour(s)).     POCT Glucose - 08/25/16 1237      POCT Blood Glucose   Pre-Exercise 112 mg/dL   Post-Exercise 134 mg/dL         Exercise Prescription Changes - 08/25/16 1200      Response to Exercise   Blood Pressure (Admit) 110/66   Blood Pressure (Exercise) 136/70   Blood Pressure (Exit) 126/84   Heart Rate (Exercise) 88 bpm   Heart Rate (Exit) 107 bpm   Oxygen Saturation (Admit) 100 %   Oxygen Saturation (Exercise) 89 %   Oxygen Saturation (Exit) 91 %   Rating of Perceived Exertion (Exercise) 12   Perceived Dyspnea (Exercise) 1   Duration Progress to 45 minutes of aerobic exercise without signs/symptoms of physical distress   Intensity THRR unchanged     Progression   Progression Continue to progress workloads to maintain intensity without signs/symptoms of physical distress.     Resistance Training    Training Prescription Yes   Weight orange bands   Reps 10-15   Time 10 Minutes     Interval Training   Interval Training No     Oxygen   Oxygen Continuous   Liters 15-25     Treadmill   MPH 1   Grade 0   Minutes 17     Recumbant Bike   Level 2   Minutes 17      History  Smoking Status  . Never Smoker  Smokeless Tobacco  . Never Used    Comment: never smoked or used tobacco products    Goals Met:  No report of cardiac concerns or symptoms Strength training completed today  Goals Unmet:  Not Applicable  Comments: Service time is from 1030 to 1230    Dr. Rush Farmer is Medical Director for Pulmonary Rehab at [facility].

## 2016-08-30 ENCOUNTER — Encounter (HOSPITAL_COMMUNITY)
Admission: RE | Admit: 2016-08-30 | Discharge: 2016-08-30 | Disposition: A | Discharge status: Home / Self Care | Payer: 59 | Source: Ambulatory Visit | Attending: Pulmonary Disease | Admitting: Pulmonary Disease

## 2016-08-30 VITALS — Wt 243.4 lb

## 2016-08-30 DIAGNOSIS — J84113 Idiopathic non-specific interstitial pneumonitis: Secondary | ICD-10-CM | POA: Diagnosis not present

## 2016-08-30 NOTE — Progress Notes (Signed)
Daily Session Note  Patient Details  Name: Felicia [name] MRN: [id number] Date of Birth: [birth year] Referring Provider:     Pulmonary Rehab Walk Test from 06/16/2016 in [place]  Referring Provider  Dr. Nelda Marseille [Dr. [name] ([facility])]      Encounter Date: 08/30/2016  Check In:     Session Check In - 08/30/16 1020      Check-In   Location [facility]   Staff Present Rosebud Poles, RN, BSN;Dalaney Needle Ysidro Evert, RN;Molly diVincenzo, MS, ACSM RCEP, Exercise Physiologist   Supervising physician immediately available to respond to emergencies [facility] immediately available   Physician(s) Dr. Burnis Medin   Medication changes reported     No   Fall or balance concerns reported    No   Tobacco Cessation No Change   Warm-up and Cool-down Performed as group-led instruction   Resistance Training Performed Yes   VAD Patient? No     Pain Assessment   Currently in Pain? No/denies   Multiple Pain Sites No      Capillary Blood Glucose: No results found for this or any previous visit (from the past 24 hour(s)).     POCT Glucose - 08/30/16 1237      POCT Blood Glucose   Pre-Exercise 109 mg/dL   Post-Exercise 104 mg/dL         Exercise Prescription Changes - 08/30/16 1200      Response to Exercise   Blood Pressure (Admit) 124/84   Blood Pressure (Exercise) 134/80   Blood Pressure (Exit) 142/80   Heart Rate (Admit) 86 bpm   Heart Rate (Exercise) 112 bpm   Heart Rate (Exit) 103 bpm   Oxygen Saturation (Admit) 100 %   Oxygen Saturation (Exercise) 88 %   Oxygen Saturation (Exit) 99 %   Rating of Perceived Exertion (Exercise) 13   Perceived Dyspnea (Exercise) 1   Duration Progress to 45 minutes of aerobic exercise without signs/symptoms of physical distress   Intensity THRR unchanged     Progression   Progression Continue to progress workloads to maintain intensity without signs/symptoms of physical distress.     Resistance  Training   Training Prescription Yes   Weight orange bands   Reps 10-15   Time 10 Minutes     Interval Training   Interval Training No     Oxygen   Oxygen Continuous   Liters 15-25     Treadmill   MPH 1   Grade 2   Minutes 17     Recumbant Bike   Level 2   Minutes 17     NuStep   Level 5   Minutes 17      History  Smoking Status  . Never Smoker  Smokeless Tobacco  . Never Used    Comment: never smoked or used tobacco products    Goals Met:  Exercise tolerated well No report of cardiac concerns or symptoms Strength training completed today  Goals Unmet:  Not Applicable  Comments: Service time is from 1030 to 1140     Dr. Rush Farmer is Medical Director for Pulmonary Rehab at [facility].

## 2016-09-01 ENCOUNTER — Encounter (HOSPITAL_COMMUNITY)
Admission: RE | Admit: 2016-09-01 | Discharge: 2016-09-01 | Disposition: A | Discharge status: Home / Self Care | Payer: 59 | Source: Ambulatory Visit | Attending: Pulmonary Disease | Admitting: Pulmonary Disease

## 2016-09-01 VITALS — Wt 242.1 lb

## 2016-09-01 DIAGNOSIS — J84113 Idiopathic non-specific interstitial pneumonitis: Secondary | ICD-10-CM

## 2016-09-01 NOTE — Progress Notes (Signed)
Daily Session Note  Patient Details  Name: Felicia [name] MRN: [id number] Date of Birth: [birth year] Referring Provider:     Pulmonary Rehab Walk Test from 06/16/2016 in [place]  Referring Provider  Dr. Nelda Marseille [Dr. [name] ([facility])]      Encounter Date: 09/01/2016  Check In:     Session Check In - 09/01/16 1022      Check-In   Location [facility]   Staff Present Rosebud Poles, RN, BSN;Molly diVincenzo, MS, ACSM RCEP, Exercise Physiologist;Lisa Ysidro Evert, RN   Supervising physician immediately available to respond to emergencies [facility] immediately available   Physician(s) Dr. [place] Pink   Medication changes reported     No   Fall or balance concerns reported    No   Tobacco Cessation No Change   Warm-up and Cool-down Performed as group-led instruction   Resistance Training Performed Yes   VAD Patient? No     Pain Assessment   Currently in Pain? No/denies   Multiple Pain Sites No      Capillary Blood Glucose: No results found for this or any previous visit (from the past 24 hour(s)).      Exercise Prescription Changes - 09/01/16 1200      Response to Exercise   Blood Pressure (Admit) 110/80   Blood Pressure (Exercise) 140/80   Blood Pressure (Exit) 130/80   Heart Rate (Admit) 70 bpm   Heart Rate (Exercise) 93 bpm   Heart Rate (Exit) 82 bpm   Oxygen Saturation (Admit) 100 %   Oxygen Saturation (Exercise) 92 %   Oxygen Saturation (Exit) 100 %   Rating of Perceived Exertion (Exercise) 11   Perceived Dyspnea (Exercise) 1   Duration Progress to 45 minutes of aerobic exercise without signs/symptoms of physical distress   Intensity THRR unchanged     Progression   Progression Continue to progress workloads to maintain intensity without signs/symptoms of physical distress.     Resistance Training   Training Prescription Yes   Weight orange bands   Reps 10-15   Time 10 Minutes     Interval Training    Interval Training No     Oxygen   Oxygen Continuous   Liters 15-25     Recumbant Bike   Level 2   Minutes 17     NuStep   Level 5   Minutes 17   METs 2      History  Smoking Status  . Never Smoker  Smokeless Tobacco  . Never Used    Comment: never smoked or used tobacco products    Goals Met:  Exercise tolerated well Strength training completed today  Goals Unmet:  Not Applicable  Comments: Service time is from 1030 to 1200    Dr. Rush Farmer is Medical Director for Pulmonary Rehab at [facility].

## 2016-09-06 ENCOUNTER — Encounter (HOSPITAL_COMMUNITY): Payer: Self-pay

## 2016-09-06 ENCOUNTER — Encounter (HOSPITAL_COMMUNITY)
Admission: RE | Admit: 2016-09-06 | Discharge: 2016-09-06 | Disposition: A | Discharge status: Home / Self Care | Payer: 59 | Source: Ambulatory Visit | Attending: Pulmonary Disease | Admitting: Pulmonary Disease

## 2016-09-06 DIAGNOSIS — J84113 Idiopathic non-specific interstitial pneumonitis: Secondary | ICD-10-CM

## 2016-09-06 NOTE — Progress Notes (Signed)
Cardiac Individual Treatment Plan  Patient Details  Name: Felicia [name] MRN: [id number] Date of Birth: October 09, 1970 Referring Provider:     Pulmonary Rehab Walk Test from 06/16/2016 in [place]  Referring Provider  Dr. Nelda Marseille [Dr. [name] ([facility])]      Initial Encounter Date:    Pulmonary Rehab Walk Test from 06/16/2016 in [place]  Date  06/17/16  Referring Provider  Dr. Nelda Marseille [Dr. [name] ([facility])]      Visit Diagnosis: Idiopathic non-specific interstitial pneumonitis ([place])  Patient's Home Medications on Admission:  Current Outpatient Prescriptions:  .  albuterol (PROVENTIL HFA;VENTOLIN HFA) 108 (90 Base) MCG/ACT inhaler, Inhale 1 puff into the lungs every 6 (six) hours as needed for wheezing or shortness of breath., Disp: , Rfl:  .  atenolol (TENORMIN) 100 MG tablet, Take 1 tablet (100 mg total) by mouth daily., Disp: 60 tablet, Rfl: 0 .  guaiFENesin (MUCINEX) 600 MG 12 hr tablet, Take 600 mg by mouth daily as needed for to loosen phlegm., Disp: , Rfl:  .  hydrochlorothiazide (HYDRODIURIL) 25 MG tablet, Take 25 mg by mouth daily., Disp: , Rfl:  .  ibuprofen (ADVIL,MOTRIN) 200 MG tablet, Take 800 mg by mouth daily as needed for moderate pain., Disp: , Rfl:  .  metFORMIN (GLUCOPHAGE) 500 MG tablet, Take 500 mg by mouth once., Disp: , Rfl:  .  mycophenolate (CELLCEPT) 500 MG tablet, Take 500 mg by mouth 2 (two) times daily. 3 tablets (1500 mg) bid, Disp: , Rfl:  .  predniSONE (DELTASONE) 10 MG tablet, Take 10 mg by mouth daily with breakfast., Disp: , Rfl:  .  sulfamethoxazole-trimethoprim (BACTRIM DS,SEPTRA DS) 800-160 MG per tablet, Take 1 tablet by mouth every Monday, Wednesday, and Friday. , Disp: , Rfl:  .  Sulfamethoxazole-Trimethoprim (BACTRIM PO), Take by mouth., Disp: , Rfl:  .  verapamil (CALAN-SR) 120 MG CR tablet, Take 1 tablet (120 mg total) by mouth at bedtime., Disp: 30 tablet, Rfl: 0  Past  Medical History: Past Medical History:  Diagnosis Date  . Hypertension     Tobacco Use: History  Smoking Status  . Never Smoker  Smokeless Tobacco  . Never Used    Comment: never smoked or used tobacco products    Labs: Recent Review Flowsheet Data    Labs for ITP Cardiac and Pulmonary Rehab Latest Ref Rng & Units 12/24/2008 12/27/2008   PHART 7.350 - 7.400 7.432(H) 7.366   PCO2ART 35.0 - 45.0 mmHg 40.2 45.6(H)   HCO3 20.0 - 24.0 mEq/L 26.3(H) 25.5(H)   TCO2 0 - 100 mmol/L 27.6 26.9   O2SAT % 93.4 97.5      Capillary Blood Glucose: Lab Results  Component Value Date   GLUCAP 113 (H) 06/23/2016   GLUCAP 150 (H) 06/23/2016   GLUCAP 92 12/30/2008   GLUCAP 169 (H) 12/29/2008   GLUCAP 210 (H) 12/29/2008       POCT Glucose    Row Name 07/12/16 1240 07/14/16 1222 07/19/16 1209 07/26/16 1225 07/28/16 1244     POCT Blood Glucose   Pre-Exercise 183 mg/dL 111 mg/dL  ate peanutbutter and cracker and greek yogurt before exercise 109 mg/dL 109 mg/dL 119 mg/dL   Post-Exercise 163 mg/dL 135 mg/dL 126 mg/dL 122 mg/dL 125 mg/dL   Row Name 08/02/16 1224 08/04/16 1242 08/09/16 1233 08/11/16 1238 08/16/16 1241     POCT Blood Glucose   Pre-Exercise 119 mg/dL 119 mg/dL 119 mg/dL 110 mg/dL 119 mg/dL  Post-Exercise 119 mg/dL 139 mg/dL 136 mg/dL 133 mg/dL 149 mg/dL   Row Name 08/18/16 1235 08/25/16 1237 08/30/16 1237         POCT Blood Glucose   Pre-Exercise 112 mg/dL 112 mg/dL 109 mg/dL     Post-Exercise 152 mg/dL 134 mg/dL 104 mg/dL        Exercise Target Goals:    Exercise Program Goal: Individual exercise prescription set with THRR, safety & activity barriers. Participant demonstrates ability to understand and report RPE using BORG scale, to self-measure pulse accurately, and to acknowledge the importance of the exercise prescription.  Exercise Prescription Goal: Starting with aerobic activity 30 plus minutes a day, 3 days per week for initial exercise prescription.  Provide home exercise prescription and guidelines that participant acknowledges understanding prior to discharge.  Activity Barriers & Risk Stratification:   6 Minute Walk:   Oxygen Initial Assessment:   Oxygen Re-Evaluation:     Oxygen Re-Evaluation    Row Name 07/12/16 0943 09/06/16 1504           Program Oxygen Prescription   Program Oxygen Prescription Continuous;E-Tanks Continuous;E-Tanks      Liters per minute 15 25      Comments using home oximizer but requires an increase in her liter flow to maintain saturations >88 with exertion. Order obtained for NRB or Venti mask at 25 liters with NRB to maintain saturations>88& with moderate exertion        Home Oxygen   Home Oxygen Device Home Concentrator;E-Tanks Home Concentrator;E-Tanks      Sleep Oxygen Prescription Continuous Continuous      Liters per minute 6 6      Home Exercise Oxygen Prescription Continuous Continuous      Liters per minute 15 15      Home at Rest Exercise Oxygen Prescription Continuous Continuous      Liters per minute 6 6      Compliance with Home Oxygen Use Yes Yes        Goals/Expected Outcomes   Short Term Goals To learn and exhibit compliance with exercise, home and travel O2 prescription;To learn and understand importance of monitoring SPO2 with pulse oximeter and demonstrate accurate use of the pulse oximeter.;To Learn and understand importance of maintaining oxygen saturations>88%;To learn and demonstrate proper purse lipped breathing techniques or other breathing techniques. To learn and exhibit compliance with exercise, home and travel O2 prescription;To learn and understand importance of monitoring SPO2 with pulse oximeter and demonstrate accurate use of the pulse oximeter.;To Learn and understand importance of maintaining oxygen saturations>88%;To learn and demonstrate proper purse lipped breathing techniques or other breathing techniques.      Long  Term Goals Exhibits compliance with  exercise, home and travel O2 prescription;Verbalizes importance of monitoring SPO2 with pulse oximeter and return demonstration;Maintenance of O2 saturations>88%;Exhibits proper breathing techniques, such as purse lipped breathing or other method taught during program session;Compliance with respiratory medication Exhibits compliance with exercise, home and travel O2 prescription;Verbalizes importance of monitoring SPO2 with pulse oximeter and return demonstration;Maintenance of O2 saturations>88%;Exhibits proper breathing techniques, such as purse lipped breathing or other method taught during program session;Compliance with respiratory medication      Comments patient able to adjust oxygen flow at pulmonary rehab based on her self monitoring of oxygen saturations. patient able to adjust oxygen flow at pulmonary rehab based on her self monitoring of oxygen saturations.      Goals/Expected Outcomes  - see above  Oxygen Discharge (Final Oxygen Re-Evaluation):     Oxygen Re-Evaluation - 09/06/16 1504      Program Oxygen Prescription   Program Oxygen Prescription Continuous;E-Tanks   Liters per minute 25   Comments with NRB to maintain saturations>88& with moderate exertion     Home Oxygen   Home Oxygen Device Home Concentrator;E-Tanks   Sleep Oxygen Prescription Continuous   Liters per minute 6   Home Exercise Oxygen Prescription Continuous   Liters per minute 15   Home at Rest Exercise Oxygen Prescription Continuous   Liters per minute 6   Compliance with Home Oxygen Use Yes     Goals/Expected Outcomes   Short Term Goals To learn and exhibit compliance with exercise, home and travel O2 prescription;To learn and understand importance of monitoring SPO2 with pulse oximeter and demonstrate accurate use of the pulse oximeter.;To Learn and understand importance of maintaining oxygen saturations>88%;To learn and demonstrate proper purse lipped breathing techniques or other breathing  techniques.   Long  Term Goals Exhibits compliance with exercise, home and travel O2 prescription;Verbalizes importance of monitoring SPO2 with pulse oximeter and return demonstration;Maintenance of O2 saturations>88%;Exhibits proper breathing techniques, such as purse lipped breathing or other method taught during program session;Compliance with respiratory medication   Comments patient able to adjust oxygen flow at pulmonary rehab based on her self monitoring of oxygen saturations.   Goals/Expected Outcomes see above      Initial Exercise Prescription:   Perform Capillary Blood Glucose checks as needed.  Exercise Prescription Changes:     Exercise Prescription Changes    Row Name 07/12/16 1200 07/14/16 1200 07/19/16 1200 07/21/16 1247 07/26/16 1222     Response to Exercise   Blood Pressure (Admit) 116/72 106/76 106/72 122/72 118/76   Blood Pressure (Exercise) 110/82 132/82 144/90 128/80 124/76   Blood Pressure (Exit) 120/80 114/70 116/86 130/82 122/82   Heart Rate (Admit) 87 bpm 73 bpm 76 bpm 79 bpm 82 bpm   Heart Rate (Exercise) 107 bpm 114 bpm 105 bpm 91 bpm 114 bpm   Heart Rate (Exit) 96 bpm 86 bpm 86 bpm 74 bpm 90 bpm   Oxygen Saturation (Admit) 99 % 100 % 100 % 99 % 100 %   Oxygen Saturation (Exercise) 92 % 100 % 93 % 96 % 88 %   Oxygen Saturation (Exit) 99 % 99 % 96 % 98 % 100 %   Rating of Perceived Exertion (Exercise) _0 Perceived Dyspnea (Exercise) 0 0 0 0 2   Duration Progress to 45 minutes of aerobic exercise without signs/symptoms of physical distress Progress to 45 minutes of aerobic exercise without signs/symptoms of physical distress Progress to 45 minutes of aerobic exercise without signs/symptoms of physical distress Progress to 45 minutes of aerobic exercise without signs/symptoms of physical distress Progress to 45 minutes of aerobic exercise without signs/symptoms of physical distress   Intensity THRR unchanged THRR unchanged THRR unchanged THRR  unchanged THRR unchanged     Progression   Progression Continue to progress workloads to maintain intensity without signs/symptoms of physical distress. Continue to progress workloads to maintain intensity without signs/symptoms of physical distress. Continue to progress workloads to maintain intensity without signs/symptoms of physical distress. Continue to progress workloads to maintain intensity without signs/symptoms of physical distress. Continue to progress workloads to maintain intensity without signs/symptoms of physical distress.     Resistance Training   Training Prescription _1    Weight orange bands orange bands orange  bands orange bands orange bands   Reps 10-15 10-15 10-15 10-15 10-15   Time 10 Minutes 10 Minutes 10 Minutes 10 Minutes 10 Minutes     Interval Training   Interval Training _0      Oxygen   Oxygen _1    Liters 15-25  Used nonrebreather mask today 15L  Only had to use 15L today with nonrebreather mask 15L  Only had to use 15L today with nonrebreather mask 15L  Only had to use 15L today with nonrebreather mask 15-25L     Treadmill   MPH  -  -  -  - 0.7   Minutes  -  -  -  - 17     Recumbant Bike   Level _2 - 2   Minutes _3 - 17     NuStep   Level _4 Minutes _5 METs 1.8 1.9 1.8 2 1.9     Track   Laps _6 -   Minutes _7 -   Row Name 07/28/16 1238 08/02/16 1200 08/04/16 1200 08/09/16 1200 08/11/16 1236     Response to Exercise   Blood Pressure (Admit) 122/76 120/80 124/88 104/60 116/70   Blood Pressure (Exercise) 132/70 110/84 124/84 124/84 116/80   Blood Pressure (Exit) 122/74 104/60 104/70 104/60 100/50   Heart Rate (Admit) 82 bpm 77 bpm 82 bpm 78 bpm 82 bpm   Heart Rate (Exercise) 102 bpm 86 bpm 97 bpm 127 bpm 100 bpm   Heart Rate (Exit) 87 bpm 75 bpm 76 bpm 70 bpm 85 bpm   Oxygen Saturation (Admit) 99 % 100 % 89 % 100  % 100 %   Oxygen Saturation (Exercise) 93 % 99 % 94 % 93 % 95 %   Oxygen Saturation (Exit) 99 % 100 % 100 % 100 % 100 %   Rating of Perceived Exertion (Exercise) _8 Perceived Dyspnea (Exercise) 0 0 0 0 0   Duration Progress to 45 minutes of aerobic exercise without signs/symptoms of physical distress Progress to 45 minutes of aerobic exercise without signs/symptoms of physical distress Progress to 45 minutes of aerobic exercise without signs/symptoms of physical distress Progress to 45 minutes of aerobic exercise without signs/symptoms of physical distress Progress to 45 minutes of aerobic exercise without signs/symptoms of physical distress   Intensity _9      Progression   Progression Continue to progress workloads to maintain intensity without signs/symptoms of physical distress. Continue to progress workloads to maintain intensity without signs/symptoms of physical distress. Continue to progress workloads to maintain intensity without signs/symptoms of physical distress. Continue to progress workloads to maintain intensity without signs/symptoms of physical distress. Continue to progress workloads to maintain intensity without signs/symptoms of physical distress.     Resistance Training   Training Prescription _10    Weight _11    Reps 10-15 10-15 10-15 10-15 10-15   Time 10 Minutes 10 Minutes 10 Minutes 10 Minutes 10 Minutes     Interval Training   Interval Training _12      Oxygen   Oxygen _13    Liters 15-25L _14 15-25     Treadmill  MPH  -  -  - 1  -   Grade  -  -  - 2  -   Minutes  -  -  - 17  -     Recumbant Bike   Level 2  -  - 2.1 2.5   Minutes 17  -  - 17 17     NuStep   Level _0 Minutes 17 51  workloads decreased today d/t exacerbation  34  workloads decreased today d/t exacerbation 17  workloads decreased today d/t exacerbation 17   METs 1.9 1._1 Row Name 08/16/16 1200 08/18/16 1200 08/25/16 1200 08/30/16 1200 09/01/16 1200     Response to Exercise   Blood Pressure (Admit) 122/86 102/72 110/66 124/84 110/80   Blood Pressure (Exercise) 124/66 134/80 136/70 134/80 140/80   Blood Pressure (Exit) 120/70 118/82 126/84 142/80 130/80   Heart Rate (Admit) 84 bpm 75 bpm  - 86 bpm 70 bpm   Heart Rate (Exercise) 111 bpm 95 bpm 88 bpm 112 bpm 93 bpm   Heart Rate (Exit) 93 bpm 77 bpm 107 bpm 103 bpm 82 bpm   Oxygen Saturation (Admit) 100 % 100 % 100 % 100 % 100 %   Oxygen Saturation (Exercise) 91 % 88 % 89 % 88 % 92 %   Oxygen Saturation (Exit) 93 % 100 % 91 % 99 % 100 %   Rating of Perceived Exertion (Exercise) _2 Perceived Dyspnea (Exercise) _3 Duration Progress to 45 minutes of aerobic exercise without signs/symptoms of physical distress Progress to 45 minutes of aerobic exercise without signs/symptoms of physical distress Progress to 45 minutes of aerobic exercise without signs/symptoms of physical distress Progress to 45 minutes of aerobic exercise without signs/symptoms of physical distress Progress to 45 minutes of aerobic exercise without signs/symptoms of physical distress   Intensity _4      Progression   Progression Continue to progress workloads to maintain intensity without signs/symptoms of physical distress. Continue to progress workloads to maintain intensity without signs/symptoms of physical distress. Continue to progress workloads to maintain intensity without signs/symptoms of physical distress. Continue to progress workloads to maintain intensity without signs/symptoms of physical distress. Continue to progress workloads to maintain intensity without signs/symptoms of physical distress.     Resistance Training    Training Prescription _5    Weight _6    Reps 10-15 10-15 10-15 10-15 10-15   Time 10 Minutes 10 Minutes 10 Minutes 10 Minutes 10 Minutes     Interval Training   Interval Training _7      Oxygen   Oxygen _8    Liters 15-25 15-25 15-25 15-25 15-25     Treadmill   MPH _9 -   Grade 2 0 0 2  -   Minutes _10 -     Recumbant Bike   Level  -  - _11 Minutes  -  - _12 NuStep   Level 5 5  - 5 5   Minutes 17 17  - 17 17   METs 1.8 1.8  -  - 2      Exercise Comments:  Exercise Goals and Review:   Exercise Goals Re-Evaluation :     Exercise Goals Re-Evaluation    Row Name 07/11/16 1103 08/09/16 1607 09/02/16 1454         Exercise Goal Re-Evaluation   Exercise Goals Review Increase Physical Activity;Increase Strenth and Stamina Increase Strenth and Stamina;Increase Physical Activity Increase Physical Activity;Increase Strenth and Stamina     Comments Patient has attended 5 sessions. She is on high liter flow (15 liters) and still desaturates into the 80's. Due to desaturation and being deconditioned she is averaging 1.4-1.6 METs. We are waiting on approval for additional guidlines on liter flow and oxygen saturation parameters. Will cont to monitor and progress as appropriate.  Patient averages MET levels of 1.8-2.0 which is an improvement. She has advanced to the treadmill. Currently on 25 liters during exercise. Tires easily. Rates her exertion at a fairly light to somewhat hard. Will cont. to monitor and progress as able.  Patient averages MET levels of 1.8-2.0. She has advanced to the treadmill. Currently on 25 liters during exercise. Tires easily. Rates her exertion at a fairly light to somewhat hard. Will cont. to monitor and progress as able.      Expected Outcomes Through exercise at rehab and at home patient  will increase physical capacity, strength, and stamina.  Through exercise at rehab and at home patient will increase physical capacity, strength, and stamina.  Through exercise at rehab and at home patient will increase physical capacity, strength, and stamina.          Discharge Exercise Prescription (Final Exercise Prescription Changes):     Exercise Prescription Changes - 09/01/16 1200      Response to Exercise   Blood Pressure (Admit) 110/80   Blood Pressure (Exercise) 140/80   Blood Pressure (Exit) 130/80   Heart Rate (Admit) 70 bpm   Heart Rate (Exercise) 93 bpm   Heart Rate (Exit) 82 bpm   Oxygen Saturation (Admit) 100 %   Oxygen Saturation (Exercise) 92 %   Oxygen Saturation (Exit) 100 %   Rating of Perceived Exertion (Exercise) 11   Perceived Dyspnea (Exercise) 1   Duration Progress to 45 minutes of aerobic exercise without signs/symptoms of physical distress   Intensity THRR unchanged     Progression   Progression Continue to progress workloads to maintain intensity without signs/symptoms of physical distress.     Resistance Training   Training Prescription Yes   Weight orange bands   Reps 10-15   Time 10 Minutes     Interval Training   Interval Training No     Oxygen   Oxygen Continuous   Liters 15-25     Recumbant Bike   Level 2   Minutes 17     NuStep   Level 5   Minutes 17   METs 2      Nutrition:  Target Goals: Understanding of nutrition guidelines, daily intake of sodium <1535m, cholesterol <2060m calories 30% from fat and 7% or less from saturated fats, daily to have 5 or more servings of fruits and vegetables.  Biometrics:    Nutrition Therapy Plan and Nutrition Goals:     Nutrition Therapy & Goals - 08/11/16 1351      Nutrition Therapy   Diet Carb Modified, Therapeutic Lifestyle Changes     Personal Nutrition Goals   Nutrition Goal Wt loss of 1-2 lb/week to a wt loss goal of 6-24 lb at graduation from Pulmonary Rehab.      Intervention Plan  Intervention Prescribe, educate and counsel regarding individualized specific dietary modifications aiming towards targeted core components such as weight, hypertension, lipid management, diabetes, heart failure and other comorbidities.   Expected Outcomes Short Term Goal: Understand basic principles of dietary content, such as calories, fat, sodium, cholesterol and nutrients.;Long Term Goal: Adherence to prescribed nutrition plan.      Nutrition Discharge: Nutrition Scores:     Nutrition Assessments - 08/11/16 1351      Rate Your Plate Scores   Pre Score 54      Nutrition Goals Re-Evaluation:   Nutrition Goals Re-Evaluation:   Nutrition Goals Discharge (Final Nutrition Goals Re-Evaluation):   Psychosocial: Target Goals: Acknowledge presence or absence of significant depression and/or stress, maximize coping skills, provide positive support system. Participant is able to verbalize types and ability to use techniques and skills needed for reducing stress and depression.  Initial Review & Psychosocial Screening:   Quality of Life Scores:   PHQ-9: Recent Review Flowsheet Data    Depression screen [facility] 2/9 05/30/2016 09/23/2014   Decreased Interest 0 0   Down, Depressed, Hopeless 0 0   PHQ - 2 Score 0 0     Interpretation of Total Score  Total Score Depression Severity:  1-4 = Minimal depression, 5-9 = Mild depression, 10-14 = Moderate depression, 15-19 = Moderately severe depression, 20-27 = Severe depression   Psychosocial Evaluation and Intervention:   Psychosocial Re-Evaluation:     Psychosocial Re-Evaluation    Row Name 07/12/16 0947 08/09/16 1600 09/06/16 1511         Psychosocial Re-Evaluation   Current issues with Current Stress Concerns Current Stress Concerns Current Stress Concerns     Expected Outcomes  - patiet will remain free from psychosocial barriers and/or identify ways to decrease stress in her life. she has refused a  therapist referral. patiet will remain free from psychosocial barriers and/or identify ways to decrease stress in her life. she has refused a therapist referral.     Interventions Encouraged to attend Pulmonary Rehabilitation for the exercise  will refer to therapist or counselor as indicated  - Encouraged to attend Pulmonary Rehabilitation for the exercise     Continue Psychosocial Services  Follow up required by staff  - Follow up required by staff     Comments progression of her disease  - progression of her disease       Initial Review   Source of Stress Concerns Chronic Illness  - Chronic Illness        Psychosocial Discharge (Final Psychosocial Re-Evaluation):     Psychosocial Re-Evaluation - 09/06/16 1511      Psychosocial Re-Evaluation   Current issues with Current Stress Concerns   Expected Outcomes patiet will remain free from psychosocial barriers and/or identify ways to decrease stress in her life. she has refused a therapist referral.   Interventions Encouraged to attend Pulmonary Rehabilitation for the exercise   Continue Psychosocial Services  Follow up required by staff   Comments progression of her disease     Initial Review   Source of Stress Concerns Chronic Illness      Vocational Rehabilitation: Provide vocational rehab assistance to qualifying candidates.   Vocational Rehab Evaluation & Intervention:   Education: Education Goals: Education classes will be provided on a weekly basis, covering required topics. Participant will state understanding/return demonstration of topics presented.  Learning Barriers/Preferences:   Education Topics: Count Your Pulse:  -Group instruction provided by verbal instruction, demonstration, patient participation and written materials to support  subject.  Instructors address importance of being able to find your pulse and how to count your pulse when at home without a heart monitor.  Patients get hands on experience counting  their pulse with staff help and individually.   Heart Attack, Angina, and Risk Factor Modification:  -Group instruction provided by verbal instruction, video, and written materials to support subject.  Instructors address signs and symptoms of angina and heart attacks.    Also discuss risk factors for heart disease and how to make changes to improve heart health risk factors.   Functional Fitness:  -Group instruction provided by verbal instruction, demonstration, patient participation, and written materials to support subject.  Instructors address safety measures for doing things around the house.  Discuss how to get up and down off the floor, how to pick things up properly, how to safely get out of a chair without assistance, and balance training.   Meditation and Mindfulness:  -Group instruction provided by verbal instruction, patient participation, and written materials to support subject.  Instructor addresses importance of mindfulness and meditation practice to help reduce stress and improve awareness.  Instructor also leads participants through a meditation exercise.    PULMONARY REHAB OTHER RESPIRATORY from 09/01/2016 in [place]  Date  07/07/16  Educator  bob hamilton  Instruction Review Code  2- meets goals/outcomes      Stretching for Flexibility and Mobility:  -Group instruction provided by verbal instruction, patient participation, and written materials to support subject.  Instructors lead participants through series of stretches that are designed to increase flexibility thus improving mobility.  These stretches are additional exercise for major muscle groups that are typically performed during regular warm up and cool down.   Hands Only CPR:  -Group verbal, video, and participation provides a basic overview of AHA guidelines for community CPR. Role-play of emergencies allow participants the opportunity to practice calling for help and chest  compression technique with discussion of AED use.   Hypertension: -Group verbal and written instruction that provides a basic overview of hypertension including the most recent diagnostic guidelines, risk factor reduction with self-care instructions and medication management.    Nutrition I class: Heart Healthy Eating:  -Group instruction provided by PowerPoint slides, verbal discussion, and written materials to support subject matter. The instructor gives an explanation and review of the Therapeutic Lifestyle Changes diet recommendations, which includes a discussion on lipid goals, dietary fat, sodium, fiber, plant stanol/sterol esters, sugar, and the components of a well-balanced, healthy diet.   Nutrition II class: Lifestyle Skills:  -Group instruction provided by PowerPoint slides, verbal discussion, and written materials to support subject matter. The instructor gives an explanation and review of label reading, grocery shopping for heart health, heart healthy recipe modifications, and ways to make healthier choices when eating out.   Diabetes Question & Answer:  -Group instruction provided by PowerPoint slides, verbal discussion, and written materials to support subject matter. The instructor gives an explanation and review of diabetes co-morbidities, pre- and post-prandial blood glucose goals, pre-exercise blood glucose goals, signs, symptoms, and treatment of hypoglycemia and hyperglycemia, and foot care basics.   Diabetes Blitz:  -Group instruction provided by PowerPoint slides, verbal discussion, and written materials to support subject matter. The instructor gives an explanation and review of the physiology behind type 1 and type 2 diabetes, diabetes medications and rational behind using different medications, pre- and post-prandial blood glucose recommendations and Hemoglobin A1c goals, diabetes diet, and exercise including blood glucose guidelines  for exercising safely.    Portion  Distortion:  -Group instruction provided by PowerPoint slides, verbal discussion, written materials, and food models to support subject matter. The instructor gives an explanation of serving size versus portion size, changes in portions sizes over the last 20 years, and what consists of a serving from each food group.   Stress Management:  -Group instruction provided by verbal instruction, video, and written materials to support subject matter.  Instructors review role of stress in heart disease and how to cope with stress positively.     Exercising on Your Own:  -Group instruction provided by verbal instruction, power point, and written materials to support subject.  Instructors discuss benefits of exercise, components of exercise, frequency and intensity of exercise, and end points for exercise.  Also discuss use of nitroglycerin and activating EMS.  Review options of places to exercise outside of rehab.  Review guidelines for sex with heart disease.   Cardiac Drugs I:  -Group instruction provided by verbal instruction and written materials to support subject.  Instructor reviews cardiac drug classes: antiplatelets, anticoagulants, beta blockers, and statins.  Instructor discusses reasons, side effects, and lifestyle considerations for each drug class.   Cardiac Drugs II:  -Group instruction provided by verbal instruction and written materials to support subject.  Instructor reviews cardiac drug classes: angiotensin converting enzyme inhibitors (ACE-I), angiotensin II receptor blockers (ARBs), nitrates, and calcium channel blockers.  Instructor discusses reasons, side effects, and lifestyle considerations for each drug class.   Anatomy and Physiology of the Circulatory System:  Group verbal and written instruction and models provide basic cardiac anatomy and physiology, with the coronary electrical and arterial systems. Review of: AMI, Angina, Valve disease, Heart Failure, Peripheral Artery  Disease, Cardiac Arrhythmia, Pacemakers, and the ICD.   Other Education:  -Group or individual verbal, written, or video instructions that support the educational goals of the cardiac rehab program.   Knowledge Questionnaire Score:   Core Components/Risk Factors/Patient Goals at Admission:   Core Components/Risk Factors/Patient Goals Review:      Goals and Risk Factor Review    Row Name 07/12/16 0946 08/09/16 1555 09/06/16 1507         Core Components/Risk Factors/Patient Goals Review   Personal Goals Review Weight Management/Obesity;Develop more efficient breathing techniques such as purse lipped breathing and diaphragmatic breathing and practicing self-pacing with activity.;Improve shortness of breath with ADL's;Increase knowledge of respiratory medications and ability to use respiratory devices properly.;Stress Weight Management/Obesity;Develop more efficient breathing techniques such as purse lipped breathing and diaphragmatic breathing and practicing self-pacing with activity.;Improve shortness of breath with ADL's;Increase knowledge of respiratory medications and ability to use respiratory devices properly.;Stress Weight Management/Obesity;Develop more efficient breathing techniques such as purse lipped breathing and diaphragmatic breathing and practicing self-pacing with activity.;Improve shortness of breath with ADL's;Increase knowledge of respiratory medications and ability to use respiratory devices properly.;Stress     Review see comment section on ITP patient has slowly began to loose weight. she is doing very well in pulmonary rehab and works really hard. She is limited by her oxygen consumption and has to take many restbreaks as 25L with a NRB is barely encugh to keep her sats >90 with moderate exertion. she does a great job of self pacing and self monitoring via pulse oximeter. She states she does see some improvement in her shortness of breath however per her recent visit to  her pulmonologist at [facility], the patients symptoms are progressing and is set up with Cytoxan infusion at the  end of july. will continue to monitor patient closely and follow up on her goals/core components. patient continues to maintain her slow weight loss. she is working very hard in pulmonary rehab and is tolerating small workload increases. she is now walking on the treadmill with 25L NRB. this does induce a cough at times. She has started her Cytoxan infusions and we anticipate her attendance may be affected as this may be hard for her to tolerate. will continue to monitor her closely and extend her sessions to 36 inorder to compensate for any deconditioning the cytoxan may inadvertantly cause.     Expected Outcomes see admission expected outcomes see admission expected outcomes see admission expected outcomes        Core Components/Risk Factors/Patient Goals at Discharge (Final Review):      Goals and Risk Factor Review - 09/06/16 1507      Core Components/Risk Factors/Patient Goals Review   Personal Goals Review Weight Management/Obesity;Develop more efficient breathing techniques such as purse lipped breathing and diaphragmatic breathing and practicing self-pacing with activity.;Improve shortness of breath with ADL's;Increase knowledge of respiratory medications and ability to use respiratory devices properly.;Stress   Review patient continues to maintain her slow weight loss. she is working very hard in pulmonary rehab and is tolerating small workload increases. she is now walking on the treadmill with 25L NRB. this does induce a cough at times. She has started her Cytoxan infusions and we anticipate her attendance may be affected as this may be hard for her to tolerate. will continue to monitor her closely and extend her sessions to 36 inorder to compensate for any deconditioning the cytoxan may inadvertantly cause.   Expected Outcomes see admission expected outcomes      ITP  Comments:   Comments: ITP REVIEW Pt is making slow expected progress toward pulmonary rehab goals after completing 20 sessions. Recommend continued exercise, life style modification, education, and utilization of breathing techniques to increase stamina and strength and decrease shortness of breath with exertion.

## 2016-09-08 ENCOUNTER — Encounter (HOSPITAL_COMMUNITY)
Admission: RE | Admit: 2016-09-08 | Discharge: 2016-09-08 | Disposition: A | Discharge status: Home / Self Care | Payer: 59 | Source: Ambulatory Visit | Attending: Pulmonary Disease | Admitting: Pulmonary Disease

## 2016-09-08 VITALS — Wt 244.0 lb

## 2016-09-08 DIAGNOSIS — I10 Essential (primary) hypertension: Secondary | ICD-10-CM | POA: Insufficient documentation

## 2016-09-08 DIAGNOSIS — Z79899 Other long term (current) drug therapy: Secondary | ICD-10-CM | POA: Insufficient documentation

## 2016-09-08 DIAGNOSIS — J84113 Idiopathic non-specific interstitial pneumonitis: Secondary | ICD-10-CM | POA: Diagnosis present

## 2016-09-08 DIAGNOSIS — Z7984 Long term (current) use of oral hypoglycemic drugs: Secondary | ICD-10-CM | POA: Diagnosis not present

## 2016-09-08 NOTE — Progress Notes (Signed)
Daily Session Note  Patient Details  Name: Felicia [name] MRN: [id number] Date of Birth: 05/09/1970 Referring Provider:     Pulmonary Rehab Walk Test from 06/16/2016 in [place]  Referring Provider  Dr. Nelda Marseille [Dr. [name] ([facility])]      Encounter Date: 09/08/2016  Check In:     Session Check In - 09/08/16 1052      Check-In   Location [facility]   Staff Present Su Hilt, MS, ACSM RCEP, Exercise Physiologist;Eriona Kinchen Ysidro Evert, RN;Portia Rollene Rotunda, RN, BSN   Supervising physician immediately available to respond to emergencies [facility] immediately available   Physician(s) Dr. Jonnie Finner   Medication changes reported     No   Fall or balance concerns reported    No   Tobacco Cessation No Change   Warm-up and Cool-down Performed as group-led instruction   Resistance Training Performed Yes   VAD Patient? No     Pain Assessment   Currently in Pain? No/denies   Multiple Pain Sites No      Capillary Blood Glucose: No results found for this or any previous visit (from the past 24 hour(s)).     POCT Glucose - 09/08/16 1239      POCT Blood Glucose   Pre-Exercise 112 mg/dL   Post-Exercise 130 mg/dL         Exercise Prescription Changes - 09/08/16 1200      Response to Exercise   Blood Pressure (Admit) 96/64   Blood Pressure (Exercise) 128/80   Blood Pressure (Exit) 120/82   Heart Rate (Admit) 72 bpm   Heart Rate (Exercise) 102 bpm   Heart Rate (Exit) 65 bpm   Oxygen Saturation (Admit) 100 %   Oxygen Saturation (Exercise) 94 %   Oxygen Saturation (Exit) 100 %   Rating of Perceived Exertion (Exercise) 12   Perceived Dyspnea (Exercise) 2   Duration Progress to 45 minutes of aerobic exercise without signs/symptoms of physical distress   Intensity THRR unchanged     Progression   Progression Continue to progress workloads to maintain intensity without signs/symptoms of physical distress.     Resistance  Training   Training Prescription Yes   Weight orange bands   Reps 10-15   Time 10 Minutes     Interval Training   Interval Training No     Oxygen   Oxygen Continuous   Liters 15-25     Treadmill   MPH 1.2   Grade 2   Minutes 17     NuStep   Level 5   Minutes 17   METs 1.8      History  Smoking Status  . Never Smoker  Smokeless Tobacco  . Never Used    Comment: never smoked or used tobacco products    Goals Met:  Personal goals reviewed No report of cardiac concerns or symptoms Strength training completed today  Goals Unmet:  Not Applicable  Comments: Service time is from 1030 to 1215    Dr. Rush Farmer is Medical Director for Pulmonary Rehab at [facility].

## 2016-09-13 ENCOUNTER — Encounter (HOSPITAL_COMMUNITY)
Admission: RE | Admit: 2016-09-13 | Discharge: 2016-09-13 | Disposition: A | Discharge status: Home / Self Care | Payer: 59 | Source: Ambulatory Visit | Attending: Pulmonary Disease | Admitting: Pulmonary Disease

## 2016-09-13 VITALS — Wt 241.4 lb

## 2016-09-13 DIAGNOSIS — J84113 Idiopathic non-specific interstitial pneumonitis: Secondary | ICD-10-CM

## 2016-09-13 NOTE — Progress Notes (Signed)
Daily Session Note  Patient Details  Name: Felicia [name] MRN: [id number] Date of Birth: 11-10-1970 Referring Provider:     Pulmonary Rehab Walk Test from 06/16/2016 in [place]  Referring Provider  Dr. Nelda Marseille [Dr. [name] ([facility])]      Encounter Date: 09/13/2016  Check In:     Session Check In - 09/13/16 1213      Check-In   Location [facility]   Staff Present Su Hilt, MS, ACSM RCEP, Exercise Physiologist;Lisa Ysidro Evert, RN;Portia Rollene Rotunda, RN, BSN   Supervising physician immediately available to respond to emergencies [facility] immediately available   Physician(s) Dr. Allyson Sabal   Medication changes reported     No   Fall or balance concerns reported    No   Tobacco Cessation No Change   Warm-up and Cool-down Performed as group-led instruction   Resistance Training Performed Yes   VAD Patient? No     Pain Assessment   Currently in Pain? No/denies   Multiple Pain Sites No      Capillary Blood Glucose: No results found for this or any previous visit (from the past 24 hour(s)).      Exercise Prescription Changes - 09/13/16 1200      Response to Exercise   Blood Pressure (Admit) 122/78   Blood Pressure (Exercise) 122/70   Blood Pressure (Exit) 126/84   Heart Rate (Admit) 75 bpm   Heart Rate (Exercise) 103 bpm   Heart Rate (Exit) 81 bpm   Oxygen Saturation (Admit) 100 %   Oxygen Saturation (Exercise) 89 %   Oxygen Saturation (Exit) 100 %   Rating of Perceived Exertion (Exercise) 12   Perceived Dyspnea (Exercise) 1   Duration Progress to 45 minutes of aerobic exercise without signs/symptoms of physical distress   Intensity THRR unchanged     Progression   Progression Continue to progress workloads to maintain intensity without signs/symptoms of physical distress.     Resistance Training   Training Prescription Yes   Weight orange bands   Reps 10-15   Time 10 Minutes     Interval Training   Interval Training No     Oxygen   Oxygen Continuous   Liters 15-25     Treadmill   MPH 1.4   Grade 2   Minutes 17     Recumbant Bike   Level 2.2   Minutes 17     NuStep   Level 5   Minutes 17   METs 2      History  Smoking Status  . Never Smoker  Smokeless Tobacco  . Never Used    Comment: never smoked or used tobacco products    Goals Met:  Exercise tolerated well No report of cardiac concerns or symptoms Strength training completed today  Goals Unmet:  Not Applicable  Comments: Service time is from 10:30a to 12:05p    Dr. Rush Farmer is Medical Director for Pulmonary Rehab at [facility].

## 2016-09-15 ENCOUNTER — Encounter (HOSPITAL_COMMUNITY)
Admission: RE | Admit: 2016-09-15 | Discharge: 2016-09-15 | Disposition: A | Discharge status: Home / Self Care | Payer: 59 | Source: Ambulatory Visit | Attending: Pulmonary Disease | Admitting: Pulmonary Disease

## 2016-09-15 VITALS — Wt 241.8 lb

## 2016-09-15 DIAGNOSIS — J84113 Idiopathic non-specific interstitial pneumonitis: Secondary | ICD-10-CM

## 2016-09-15 NOTE — Progress Notes (Signed)
Daily Session Note  Patient Details  Name: Shruti Arrey MRN: [id number] Date of Birth: 08-Aug-1970 Referring Provider:     Pulmonary Rehab Walk Test from 06/16/2016 in [place]  Referring Provider  Dr. Nelda Marseille [Dr. [name] ([facility])]      Encounter Date: 09/15/2016  Check In:     Session Check In - 09/15/16 1050      Check-In   [facility]   Staff Present Su Hilt, MS, ACSM RCEP, Exercise Physiologist;Lisa Ysidro Evert, RN;Portia Rollene Rotunda, RN, BSN   Supervising physician immediately available to respond to emergencies [facility] immediately available   Physician(s) Dr. Clementeen Graham   Medication changes reported     No   Fall or balance concerns reported    No   Tobacco Cessation No Change   Warm-up and Cool-down Performed as group-led instruction   Resistance Training Performed Yes   VAD Patient? No     Pain Assessment   Currently in Pain? No/denies   Multiple Pain Sites No      Capillary Blood Glucose: No results found for this or any previous visit (from the past 24 hour(s)).     POCT Glucose - 09/15/16 1314      POCT Blood Glucose   Pre-Exercise 126 mg/dL   Post-Exercise 140 mg/dL         Exercise Prescription Changes - 09/15/16 1300      Response to Exercise   Blood Pressure (Admit) 106/56   Blood Pressure (Exercise) 114/60   Blood Pressure (Exit) 120/66   Heart Rate (Admit) 67 bpm   Heart Rate (Exercise) 83 bpm   Heart Rate (Exit) 70 bpm   Oxygen Saturation (Admit) 100 %   Oxygen Saturation (Exercise) 95 %   Oxygen Saturation (Exit) 100 %   Rating of Perceived Exertion (Exercise) 13   Perceived Dyspnea (Exercise) 1   Duration Progress to 45 minutes of aerobic exercise without signs/symptoms of physical distress   Intensity THRR unchanged     Progression   Progression Continue to progress workloads to maintain intensity without signs/symptoms of physical distress.     Resistance  Training   Training Prescription Yes   Weight orange bands   Reps 10-15   Time 10 Minutes     Interval Training   Interval Training No     Oxygen   Oxygen Continuous   Liters 15-25     Recumbant Bike   Level 5   Minutes 17     NuStep   Level 5   Minutes 17   METs 2.2      History  Smoking Status  . Never Smoker  Smokeless Tobacco  . Never Used    Comment: never smoked or used tobacco products    Goals Met:  Exercise tolerated well No report of cardiac concerns or symptoms Strength training completed today  Goals Unmet:  Not Applicable  Comments: Service time is from 10:30a to 12:40p    Dr. Rush Farmer is Medical Director for Pulmonary Rehab at [name].

## 2016-09-20 ENCOUNTER — Encounter (HOSPITAL_COMMUNITY)
Admission: RE | Admit: 2016-09-20 | Discharge: 2016-09-20 | Disposition: A | Discharge status: Home / Self Care | Payer: 59 | Source: Ambulatory Visit | Attending: Pulmonary Disease | Admitting: Pulmonary Disease

## 2016-09-20 VITALS — Wt 240.7 lb

## 2016-09-20 DIAGNOSIS — J84113 Idiopathic non-specific interstitial pneumonitis: Secondary | ICD-10-CM | POA: Diagnosis not present

## 2016-09-20 NOTE — Progress Notes (Signed)
Daily Session Note  Patient Details  Name: Felicia [name] MRN: [id number] Date of Birth: 01/23/1971 Referring Provider:     Pulmonary Rehab Walk Test from 06/16/2016 in [place]  Referring Provider  Dr. Nelda Marseille [Dr. [name] ([facility])]      Encounter Date: 09/20/2016  Check In:     Session Check In - 09/20/16 1227      Check-In   Location [facility]   Staff Present Su Hilt, MS, ACSM RCEP, Exercise Physiologist;Muzamil Harker Ysidro Evert, RN   Supervising physician immediately available to respond to emergencies [facility] immediately available   Physician(s) Dr. Clementeen Graham   Medication changes reported     No   Fall or balance concerns reported    No   Tobacco Cessation No Change   Warm-up and Cool-down Performed as group-led instruction   Resistance Training Performed Yes   VAD Patient? No     Pain Assessment   Currently in Pain? No/denies   Multiple Pain Sites No      Capillary Blood Glucose: No results found for this or any previous visit (from the past 24 hour(s)).     POCT Glucose - 09/20/16 1236      POCT Blood Glucose   Pre-Exercise 109 mg/dL   Post-Exercise 128 mg/dL         Exercise Prescription Changes - 09/20/16 1200      Response to Exercise   Blood Pressure (Admit) 120/80   Blood Pressure (Exercise) 120/70   Blood Pressure (Exit) 130/80   Heart Rate (Admit) 76 bpm   Heart Rate (Exercise) 104 bpm   Heart Rate (Exit) 79 bpm   Oxygen Saturation (Admit) 100 %   Oxygen Saturation (Exercise) 93 %   Oxygen Saturation (Exit) 100 %   Rating of Perceived Exertion (Exercise) 12   Perceived Dyspnea (Exercise) 1   Duration Progress to 45 minutes of aerobic exercise without signs/symptoms of physical distress   Intensity THRR unchanged     Progression   Progression Continue to progress workloads to maintain intensity without signs/symptoms of physical distress.     Resistance Training   Training  Prescription Yes   Weight orange bands   Reps 10-15   Time 10 Minutes     Interval Training   Interval Training No     Oxygen   Oxygen Continuous   Liters 15-25     Treadmill   MPH 1.4   Grade 2   Minutes 17     Recumbant Bike   Level 2.4   Minutes 17     NuStep   Level 5   Minutes 17   METs 2.1      History  Smoking Status  . Never Smoker  Smokeless Tobacco  . Never Used    Comment: never smoked or used tobacco products    Goals Met:  Exercise tolerated well No report of cardiac concerns or symptoms Strength training completed today  Goals Unmet:  Not Applicable  Comments: Service time is from 1030 to 1210    Dr. Rush Farmer is Medical Director for Pulmonary Rehab at [facility].

## 2016-09-22 ENCOUNTER — Encounter (HOSPITAL_COMMUNITY)
Admission: RE | Admit: 2016-09-22 | Discharge: 2016-09-22 | Disposition: A | Discharge status: Home / Self Care | Payer: 59 | Source: Ambulatory Visit | Attending: Pulmonary Disease | Admitting: Pulmonary Disease

## 2016-09-22 VITALS — Wt 239.6 lb

## 2016-09-22 DIAGNOSIS — J84113 Idiopathic non-specific interstitial pneumonitis: Secondary | ICD-10-CM | POA: Diagnosis not present

## 2016-09-22 NOTE — Progress Notes (Signed)
Daily Session Note  Patient Details  Name: Dashawn Golda MRN: [id number] Date of Birth: 1970-04-28 Referring Provider:     Pulmonary Rehab Walk Test from 06/16/2016 in [place]  Referring Provider  Dr. Nelda Marseille [Dr. [name] ([facility])]      Encounter Date: 09/22/2016  Check In:     Session Check In - 09/22/16 1030      Check-In   Location [facility]   Staff Present Su Hilt, MS, ACSM RCEP, Exercise Physiologist;Lisa Ysidro Evert, RN;Portia Rollene Rotunda, RN, BSN   Supervising physician immediately available to respond to emergencies [facility] immediately available   Physician(s) Dr. Wendee Beavers   Medication changes reported     No   Fall or balance concerns reported    No   Tobacco Cessation No Change   Warm-up and Cool-down Performed as group-led instruction   Resistance Training Performed Yes   VAD Patient? No     Pain Assessment   Currently in Pain? No/denies   Multiple Pain Sites No      Capillary Blood Glucose: No results found for this or any previous visit (from the past 24 hour(s)).     POCT Glucose - 09/22/16 1228      POCT Blood Glucose   Pre-Exercise 109 mg/dL   Post-Exercise 130 mg/dL         Exercise Prescription Changes - 09/22/16 1200      Response to Exercise   Blood Pressure (Admit) 120/84   Blood Pressure (Exercise) 110/62   Blood Pressure (Exit) 120/70   Heart Rate (Admit) 83 bpm   Heart Rate (Exercise) 101 bpm   Heart Rate (Exit) 78 bpm   Oxygen Saturation (Admit) 100 %   Oxygen Saturation (Exercise) 93 %   Oxygen Saturation (Exit) 100 %   Rating of Perceived Exertion (Exercise) 12   Perceived Dyspnea (Exercise) 1   Duration Progress to 45 minutes of aerobic exercise without signs/symptoms of physical distress   Intensity THRR unchanged     Progression   Progression Continue to progress workloads to maintain intensity without signs/symptoms of physical distress.     Resistance  Training   Training Prescription Yes   Weight orange bands   Reps 10-15   Time 10 Minutes     Interval Training   Interval Training No     Oxygen   Oxygen Continuous   Liters 15-25     Treadmill   MPH 1.4   Grade 2   Minutes 17     Recumbant Bike   Level 2.4   Minutes 17     NuStep   Level 5   Minutes 17   METs 2      History  Smoking Status  . Never Smoker  Smokeless Tobacco  . Never Used    Comment: never smoked or used tobacco products    Goals Met:  Exercise tolerated well No report of cardiac concerns or symptoms Strength training completed today  Goals Unmet:  Not Applicable  Comments: Service time is from 10:30a to 12:10p    Dr. Rush Farmer is Medical Director for Pulmonary Rehab at [facility].

## 2016-09-27 ENCOUNTER — Encounter (HOSPITAL_COMMUNITY)
Admission: RE | Admit: 2016-09-27 | Discharge: 2016-09-27 | Disposition: A | Discharge status: Home / Self Care | Payer: 59 | Source: Ambulatory Visit | Attending: Pulmonary Disease | Admitting: Pulmonary Disease

## 2016-09-27 DIAGNOSIS — J84113 Idiopathic non-specific interstitial pneumonitis: Secondary | ICD-10-CM | POA: Diagnosis not present

## 2016-09-27 NOTE — Progress Notes (Signed)
Daily Session Note  Patient Details  Name: Felicia [name] MRN: [id number] Date of Birth: Jul 01, 1970 Referring Provider:     Pulmonary Rehab Walk Test from 06/16/2016 in [place]  Referring Provider  Dr. Nelda Marseille [Dr. [name] ([facility])]      Encounter Date: 09/27/2016  Check In:     Session Check In - 09/27/16 1212      Check-In   Location [facility]   Staff Present Su Hilt, MS, ACSM RCEP, Exercise Physiologist;Arminta Gamm Ysidro Evert, RN;Other;Portia Rollene Rotunda, RN, BSN   Supervising physician immediately available to respond to emergencies [facility] immediately available   Physician(s) Dr. Wendee Beavers   Medication changes reported     No   Fall or balance concerns reported    No   Tobacco Cessation No Change   Warm-up and Cool-down Performed as group-led instruction   Resistance Training Performed Yes   VAD Patient? No     Pain Assessment   Currently in Pain? No/denies   Multiple Pain Sites No      Capillary Blood Glucose: No results found for this or any previous visit (from the past 24 hour(s)).      Exercise Prescription Changes - 09/27/16 1200      Response to Exercise   Blood Pressure (Admit) 118/84   Blood Pressure (Exercise) 118/80   Blood Pressure (Exit) 124/70   Heart Rate (Admit) 76 bpm   Heart Rate (Exercise) 100 bpm   Heart Rate (Exit) 78 bpm   Oxygen Saturation (Admit) 100 %   Oxygen Saturation (Exercise) 93 %   Oxygen Saturation (Exit) 100 %   Rating of Perceived Exertion (Exercise) 11   Perceived Dyspnea (Exercise) 0   Duration Progress to 45 minutes of aerobic exercise without signs/symptoms of physical distress   Intensity THRR unchanged     Progression   Progression Continue to progress workloads to maintain intensity without signs/symptoms of physical distress.     Resistance Training   Training Prescription Yes   Weight orange bands   Reps 10-15   Time 10 Minutes     Interval  Training   Interval Training No     Oxygen   Oxygen Continuous   Liters 15-25     Treadmill   MPH 1.5   Grade 2   Minutes 17     Recumbant Bike   Level 2.4   Minutes 17     NuStep   Level 5   Minutes 17   METs 2      History  Smoking Status  . Never Smoker  Smokeless Tobacco  . Never Used    Comment: never smoked or used tobacco products    Goals Met:  Exercise tolerated well No report of cardiac concerns or symptoms Strength training completed today  Goals Unmet:  Not Applicable  Comments: Service time is from 1030 to 1200    Dr. Rush Farmer is Medical Director for Pulmonary Rehab at [name].

## 2016-09-29 ENCOUNTER — Encounter (HOSPITAL_COMMUNITY)
Admission: RE | Admit: 2016-09-29 | Discharge: 2016-09-29 | Disposition: A | Discharge status: Home / Self Care | Payer: 59 | Source: Ambulatory Visit | Attending: Pulmonary Disease | Admitting: Pulmonary Disease

## 2016-09-29 VITALS — Wt 241.2 lb

## 2016-09-29 DIAGNOSIS — J84113 Idiopathic non-specific interstitial pneumonitis: Secondary | ICD-10-CM | POA: Diagnosis not present

## 2016-09-29 NOTE — Progress Notes (Signed)
Daily Session Note  Patient Details  Name: Felicia [name] MRN: [id number] Date of Birth: [birth year] Referring Provider:     Pulmonary Rehab Walk Test from 06/16/2016 in [place]  Referring Provider  Dr. Nelda Marseille [Dr. [name] ([facility])]      Encounter Date: 09/29/2016  Check In:     Session Check In - 09/29/16 1030      Check-In   Location [facility]   Staff Present Su Hilt, MS, ACSM RCEP, Exercise Physiologist;Lisa Ysidro Evert, RN;Other;Portia Rollene Rotunda, RN, BSN   Supervising physician immediately available to respond to emergencies [facility] immediately available   Physician(s) Dr. Clementeen Graham   Medication changes reported     No   Fall or balance concerns reported    No   Tobacco Cessation No Change   Warm-up and Cool-down Performed as group-led instruction   Resistance Training Performed Yes   VAD Patient? No     Pain Assessment   Currently in Pain? No/denies   Multiple Pain Sites No      Capillary Blood Glucose: No results found for this or any previous visit (from the past 24 hour(s)).      Exercise Prescription Changes - 09/29/16 1200      Response to Exercise   Blood Pressure (Admit) 124/90   Blood Pressure (Exercise) 150/90   Blood Pressure (Exit) 120/80   Heart Rate (Admit) 82 bpm   Heart Rate (Exercise) 106 bpm   Heart Rate (Exit) 77 bpm   Oxygen Saturation (Admit) 99 %   Oxygen Saturation (Exercise) 92 %   Oxygen Saturation (Exit) 100 %   Rating of Perceived Exertion (Exercise) 11   Perceived Dyspnea (Exercise) 1   Duration Progress to 45 minutes of aerobic exercise without signs/symptoms of physical distress   Intensity THRR unchanged     Progression   Progression Continue to progress workloads to maintain intensity without signs/symptoms of physical distress.     Resistance Training   Training Prescription Yes   Weight orange bands   Reps 10-15   Time 10 Minutes     Interval  Training   Interval Training No     Oxygen   Oxygen Continuous   Liters 15-25     Treadmill   MPH 1.6   Grade 2   Minutes 17     NuStep   Level 5   Minutes 17   METs 2.2      History  Smoking Status  . Never Smoker  Smokeless Tobacco  . Never Used    Comment: never smoked or used tobacco products    Goals Met:  Exercise tolerated well No report of cardiac concerns or symptoms Strength training completed today  Goals Unmet:  Not Applicable  Comments: Service time is from 10:30a to 12:35p    Dr. Rush Farmer is Medical Director for Pulmonary Rehab at [facility].

## 2016-10-04 ENCOUNTER — Encounter (HOSPITAL_COMMUNITY)
Admission: RE | Admit: 2016-10-04 | Discharge: 2016-10-04 | Disposition: A | Discharge status: Home / Self Care | Payer: 59 | Source: Ambulatory Visit | Attending: Pulmonary Disease | Admitting: Pulmonary Disease

## 2016-10-05 ENCOUNTER — Encounter (HOSPITAL_COMMUNITY): Payer: Self-pay

## 2016-10-05 DIAGNOSIS — J84113 Idiopathic non-specific interstitial pneumonitis: Secondary | ICD-10-CM

## 2016-10-05 NOTE — Progress Notes (Signed)
Pulmonary Individual Treatment Plan  Patient Details  Name: Felicia [name] MRN: [id number] Date of Birth: 02-28-1970 Referring Provider:     Pulmonary Rehab Walk Test from 06/16/2016 in [place]  Referring Provider  Dr. Nelda Marseille [Dr. [name] ([facility])]      Initial Encounter Date:    Pulmonary Rehab Walk Test from 06/16/2016 in [place]  Date  06/17/16  Referring Provider  Dr. Nelda Marseille [Dr. [name] ([facility])]      Visit Diagnosis: Idiopathic non-specific interstitial pneumonitis ([place])  Patient's Home Medications on Admission:   Current Outpatient Prescriptions:  .  albuterol (PROVENTIL HFA;VENTOLIN HFA) 108 (90 Base) MCG/ACT inhaler, Inhale 1 puff into the lungs every 6 (six) hours as needed for wheezing or shortness of breath., Disp: , Rfl:  .  atenolol (TENORMIN) 100 MG tablet, Take 1 tablet (100 mg total) by mouth daily., Disp: 60 tablet, Rfl: 0 .  guaiFENesin (MUCINEX) 600 MG 12 hr tablet, Take 600 mg by mouth daily as needed for to loosen phlegm., Disp: , Rfl:  .  hydrochlorothiazide (HYDRODIURIL) 25 MG tablet, Take 25 mg by mouth daily., Disp: , Rfl:  .  ibuprofen (ADVIL,MOTRIN) 200 MG tablet, Take 800 mg by mouth daily as needed for moderate pain., Disp: , Rfl:  .  metFORMIN (GLUCOPHAGE) 500 MG tablet, Take 500 mg by mouth once., Disp: , Rfl:  .  mycophenolate (CELLCEPT) 500 MG tablet, Take 500 mg by mouth 2 (two) times daily. 3 tablets (1500 mg) bid, Disp: , Rfl:  .  predniSONE (DELTASONE) 10 MG tablet, Take 10 mg by mouth daily with breakfast., Disp: , Rfl:  .  sulfamethoxazole-trimethoprim (BACTRIM DS,SEPTRA DS) 800-160 MG per tablet, Take 1 tablet by mouth every Monday, Wednesday, and Friday. , Disp: , Rfl:  .  Sulfamethoxazole-Trimethoprim (BACTRIM PO), Take by mouth., Disp: , Rfl:  .  verapamil (CALAN-SR) 120 MG CR tablet, Take 1 tablet (120 mg total) by mouth at bedtime., Disp: 30 tablet, Rfl:  0  Past Medical History: Past Medical History:  Diagnosis Date  . Hypertension     Tobacco Use: History  Smoking Status  . Never Smoker  Smokeless Tobacco  . Never Used    Comment: never smoked or used tobacco products    Labs: Recent Review Flowsheet Data    Labs for ITP Cardiac and Pulmonary Rehab Latest Ref Rng & Units 12/24/2008 12/27/2008   PHART 7.350 - 7.400 7.432(H) 7.366   PCO2ART 35.0 - 45.0 mmHg 40.2 45.6(H)   HCO3 20.0 - 24.0 mEq/L 26.3(H) 25.5(H)   TCO2 0 - 100 mmol/L 27.6 26.9   O2SAT % 93.4 97.5      Capillary Blood Glucose: Lab Results  Component Value Date   GLUCAP 113 (H) 06/23/2016   GLUCAP 150 (H) 06/23/2016   GLUCAP 92 12/30/2008   GLUCAP 169 (H) 12/29/2008   GLUCAP 210 (H) 12/29/2008       POCT Glucose    Row Name 08/09/16 1233 08/11/16 1238 08/16/16 1241 08/18/16 1235 08/25/16 1237     POCT Blood Glucose   Pre-Exercise 119 mg/dL 110 mg/dL 119 mg/dL 112 mg/dL 112 mg/dL   Post-Exercise 136 mg/dL 133 mg/dL 149 mg/dL 152 mg/dL 134 mg/dL   Row Name 08/30/16 1237 09/08/16 1239 09/13/16 1228 09/15/16 1314 09/20/16 1236     POCT Blood Glucose   Pre-Exercise 109 mg/dL 112 mg/dL 109 mg/dL 126 mg/dL 109 mg/dL   Post-Exercise 104 mg/dL 130 mg/dL 139 mg/dL  140 mg/dL 128 mg/dL   Row Name 09/22/16 1228 09/27/16 1228 09/29/16 1244         POCT Blood Glucose   Pre-Exercise 109 mg/dL 110 mg/dL 109 mg/dL     Post-Exercise 130 mg/dL 127 mg/dL 129 mg/dL        Pulmonary Assessment Scores:   Pulmonary Function Assessment:   Exercise Target Goals:    Exercise Program Goal: Individual exercise prescription set with THRR, safety & activity barriers. Participant demonstrates ability to understand and report RPE using BORG scale, to self-measure pulse accurately, and to acknowledge the importance of the exercise prescription.  Exercise Prescription Goal: Starting with aerobic activity 30 plus minutes a day, 3 days per week for initial exercise  prescription. Provide home exercise prescription and guidelines that participant acknowledges understanding prior to discharge.  Activity Barriers & Risk Stratification:   6 Minute Walk:   Oxygen Initial Assessment:   Oxygen Re-Evaluation:     Oxygen Re-Evaluation    Row Name 09/06/16 1504 10/05/16 1413           Program Oxygen Prescription   Program Oxygen Prescription Continuous;E-Tanks Continuous;E-Tanks      Liters per minute 25 25      Comments with NRB to maintain saturations>88& with moderate exertion with NRB to maintain saturations>88& with moderate exertion        Home Oxygen   Home Oxygen Device Home Concentrator;E-Tanks Home Concentrator;E-Tanks      Sleep Oxygen Prescription Continuous Continuous      Liters per minute 6 6      Home Exercise Oxygen Prescription Continuous Continuous      Liters per minute 15 15      Home at Rest Exercise Oxygen Prescription Continuous Continuous      Liters per minute 6 6      Compliance with Home Oxygen Use Yes Yes        Goals/Expected Outcomes   Short Term Goals To learn and exhibit compliance with exercise, home and travel O2 prescription;To learn and understand importance of monitoring SPO2 with pulse oximeter and demonstrate accurate use of the pulse oximeter.;To Learn and understand importance of maintaining oxygen saturations>88%;To learn and demonstrate proper purse lipped breathing techniques or other breathing techniques. To learn and exhibit compliance with exercise, home and travel O2 prescription;To learn and understand importance of monitoring SPO2 with pulse oximeter and demonstrate accurate use of the pulse oximeter.;To learn and understand importance of maintaining oxygen saturations>88%;To learn and demonstrate proper pursed lip breathing techniques or other breathing techniques.;To learn and demonstrate proper use of respiratory medications      Long  Term Goals Exhibits compliance with exercise, home and  travel O2 prescription;Verbalizes importance of monitoring SPO2 with pulse oximeter and return demonstration;Maintenance of O2 saturations>88%;Exhibits proper breathing techniques, such as purse lipped breathing or other method taught during program session;Compliance with respiratory medication Exhibits compliance with exercise, home and travel O2 prescription;Verbalizes importance of monitoring SPO2 with pulse oximeter and return demonstration;Maintenance of O2 saturations>88%;Exhibits proper breathing techniques, such as pursed lip breathing or other method taught during program session;Compliance with respiratory medication      Comments patient able to adjust oxygen flow at pulmonary rehab based on her self monitoring of oxygen saturations. patient able to adjust oxygen flow at pulmonary rehab based on her self monitoring of oxygen saturations.      Goals/Expected Outcomes see above see above         Oxygen Discharge (Final Oxygen Re-Evaluation):  Oxygen Re-Evaluation - 10/05/16 1413      Program Oxygen Prescription   Program Oxygen Prescription Continuous;E-Tanks   Liters per minute 25   Comments with NRB to maintain saturations>88& with moderate exertion     Home Oxygen   Home Oxygen Device Home Concentrator;E-Tanks   Sleep Oxygen Prescription Continuous   Liters per minute 6   Home Exercise Oxygen Prescription Continuous   Liters per minute 15   Home at Rest Exercise Oxygen Prescription Continuous   Liters per minute 6   Compliance with Home Oxygen Use Yes     Goals/Expected Outcomes   Short Term Goals To learn and exhibit compliance with exercise, home and travel O2 prescription;To learn and understand importance of monitoring SPO2 with pulse oximeter and demonstrate accurate use of the pulse oximeter.;To learn and understand importance of maintaining oxygen saturations>88%;To learn and demonstrate proper pursed lip breathing techniques or other breathing techniques.;To  learn and demonstrate proper use of respiratory medications   Long  Term Goals Exhibits compliance with exercise, home and travel O2 prescription;Verbalizes importance of monitoring SPO2 with pulse oximeter and return demonstration;Maintenance of O2 saturations>88%;Exhibits proper breathing techniques, such as pursed lip breathing or other method taught during program session;Compliance with respiratory medication   Comments patient able to adjust oxygen flow at pulmonary rehab based on her self monitoring of oxygen saturations.   Goals/Expected Outcomes see above      Initial Exercise Prescription:   Perform Capillary Blood Glucose checks as needed.  Exercise Prescription Changes:     Exercise Prescription Changes    Row Name 08/09/16 1200 08/11/16 1236 08/16/16 1200 08/18/16 1200 08/25/16 1200     Response to Exercise   Blood Pressure (Admit) 104/60 116/70 122/86 102/72 110/66   Blood Pressure (Exercise) 124/84 116/80 124/66 134/80 136/70   Blood Pressure (Exit) 104/60 100/50 120/70 118/82 126/84   Heart Rate (Admit) 78 bpm 82 bpm 84 bpm 75 bpm  -   Heart Rate (Exercise) 127 bpm 100 bpm 111 bpm 95 bpm 88 bpm   Heart Rate (Exit) 70 bpm 85 bpm 93 bpm 77 bpm 107 bpm   Oxygen Saturation (Admit) 100 % 100 % 100 % 100 % 100 %   Oxygen Saturation (Exercise) 93 % 95 % 91 % 88 % 89 %   Oxygen Saturation (Exit) 100 % 100 % 93 % 100 % 91 %   Rating of Perceived Exertion (Exercise) _0 Perceived Dyspnea (Exercise) 0 0 _1 Duration Progress to 45 minutes of aerobic exercise without signs/symptoms of physical distress Progress to 45 minutes of aerobic exercise without signs/symptoms of physical distress Progress to 45 minutes of aerobic exercise without signs/symptoms of physical distress Progress to 45 minutes of aerobic exercise without signs/symptoms of physical distress Progress to 45 minutes of aerobic exercise without signs/symptoms of physical distress   Intensity THRR  unchanged THRR unchanged THRR unchanged THRR unchanged THRR unchanged     Progression   Progression Continue to progress workloads to maintain intensity without signs/symptoms of physical distress. Continue to progress workloads to maintain intensity without signs/symptoms of physical distress. Continue to progress workloads to maintain intensity without signs/symptoms of physical distress. Continue to progress workloads to maintain intensity without signs/symptoms of physical distress. Continue to progress workloads to maintain intensity without signs/symptoms of physical distress.     Resistance Training   Training Prescription _2    Weight orange bands orange bands orange bands  orange bands orange bands   Reps 10-15 10-15 10-15 10-15 10-15   Time 10 Minutes 10 Minutes 10 Minutes 10 Minutes 10 Minutes     Interval Training   Interval Training _0      Oxygen   Oxygen _1    Liters 25 15-25 15-25 15-25 15-25     Treadmill   MPH 1  - _2 Grade 2  - 2 0 0   Minutes 17  - _3 Recumbant Bike   Level 2.1 2.5  -  - 2   Minutes 17 17  -  - 17     NuStep   Level _4 -   Minutes 17  workloads decreased today d/t exacerbation _5 -   METs 2 2 1.8 1.8  -   Row Name 08/30/16 1200 09/01/16 1200 09/08/16 1200 09/13/16 1200 09/15/16 1300     Response to Exercise   Blood Pressure (Admit) 124/84 110/80 96/64 122/78 106/56   Blood Pressure (Exercise) 134/80 140/80 128/80 122/70 114/60   Blood Pressure (Exit) 142/80 130/80 120/82 126/84 120/66   Heart Rate (Admit) 86 bpm 70 bpm 72 bpm 75 bpm 67 bpm   Heart Rate (Exercise) 112 bpm 93 bpm 102 bpm 103 bpm 83 bpm   Heart Rate (Exit) 103 bpm 82 bpm 65 bpm 81 bpm 70 bpm   Oxygen Saturation (Admit) 100 % 100 % 100 % 100 % 100 %   Oxygen Saturation (Exercise) 88 % 92 % 94 % 89 % 95 %   Oxygen Saturation (Exit) 99 % 100 % 100 % 100 % 100 %   Rating of  Perceived Exertion (Exercise) _6 Perceived Dyspnea (Exercise) _7 Duration Progress to 45 minutes of aerobic exercise without signs/symptoms of physical distress Progress to 45 minutes of aerobic exercise without signs/symptoms of physical distress Progress to 45 minutes of aerobic exercise without signs/symptoms of physical distress Progress to 45 minutes of aerobic exercise without signs/symptoms of physical distress Progress to 45 minutes of aerobic exercise without signs/symptoms of physical distress   Intensity _8      Progression   Progression Continue to progress workloads to maintain intensity without signs/symptoms of physical distress. Continue to progress workloads to maintain intensity without signs/symptoms of physical distress. Continue to progress workloads to maintain intensity without signs/symptoms of physical distress. Continue to progress workloads to maintain intensity without signs/symptoms of physical distress. Continue to progress workloads to maintain intensity without signs/symptoms of physical distress.     Resistance Training   Training Prescription _9    Weight _10    Reps 10-15 10-15 10-15 10-15 10-15   Time 10 Minutes 10 Minutes 10 Minutes 10 Minutes 10 Minutes     Interval Training   Interval Training _11      Oxygen   Oxygen _12    Liters 15-25 15-25 15-25 15-25 15-25     Treadmill   MPH 1  - 1.2 1.4  -   Grade 2  - 2 2  -   Minutes 17  - 17 17  -     Recumbant Bike   Level 2 2  - 2.2 5  Minutes 17 17  - 17 17     NuStep   Level _0 Minutes _1 METs  - 2 1.8 2 2.2   Row Name 09/20/16 1200 09/22/16 1200 09/27/16 1200 09/29/16 1200       Response to Exercise   Blood Pressure (Admit) 120/80 120/84  118/84 124/90    Blood Pressure (Exercise) 120/70 110/62 118/80 150/90    Blood Pressure (Exit) 130/80 120/70 124/70 120/80    Heart Rate (Admit) 76 bpm 83 bpm 76 bpm 82 bpm    Heart Rate (Exercise) 104 bpm 101 bpm 100 bpm 106 bpm    Heart Rate (Exit) 79 bpm 78 bpm 78 bpm 77 bpm    Oxygen Saturation (Admit) 100 % 100 % 100 % 99 %    Oxygen Saturation (Exercise) 93 % 93 % 93 % 92 %    Oxygen Saturation (Exit) 100 % 100 % 100 % 100 %    Rating of Perceived Exertion (Exercise) _2 Perceived Dyspnea (Exercise) 1 1 0 1    Duration Progress to 45 minutes of aerobic exercise without signs/symptoms of physical distress Progress to 45 minutes of aerobic exercise without signs/symptoms of physical distress Progress to 45 minutes of aerobic exercise without signs/symptoms of physical distress Progress to 45 minutes of aerobic exercise without signs/symptoms of physical distress    Intensity THRR unchanged THRR unchanged THRR unchanged THRR unchanged      Progression   Progression Continue to progress workloads to maintain intensity without signs/symptoms of physical distress. Continue to progress workloads to maintain intensity without signs/symptoms of physical distress. Continue to progress workloads to maintain intensity without signs/symptoms of physical distress. Continue to progress workloads to maintain intensity without signs/symptoms of physical distress.      Resistance Training   Training Prescription Yes Yes Yes Yes    Weight orange bands orange bands orange bands orange bands    Reps 10-15 10-15 10-15 10-15    Time 10 Minutes 10 Minutes 10 Minutes 10 Minutes      Interval Training   Interval Training No No No No      Oxygen   Oxygen Continuous Continuous Continuous Continuous    Liters 15-25 15-25 15-25 15-25      Treadmill   MPH 1.4 1.4 1.5 1.6    Grade _3 Minutes _4 Recumbant Bike   Level 2.4 2.4 2.4  -    Minutes _5 -      NuStep    Level _6 Minutes _7 METs 2._8 2.2       Exercise Comments:   Exercise Goals and Review:   Exercise Goals Re-Evaluation :     Exercise Goals Re-Evaluation    Row Name 08/09/16 1607 09/02/16 1454 10/04/16 0812         Exercise Goal Re-Evaluation   Exercise Goals Review Increase Strenth and Stamina;Increase Physical Activity Increase Physical Activity;Increase Strenth and Stamina Increase Physical Activity;Increase Strength and Stamina;Able to understand and use Dyspnea scale;Able to understand and use rate of perceived exertion (RPE) scale;Knowledge and understanding of Target Heart Rate Range (THRR);Understanding of Exercise Prescription     Comments Patient averages MET levels of 1.8-2.0 which is an improvement. She has advanced to the  treadmill. Currently on 25 liters during exercise. Tires easily. Rates her exertion at a fairly light to somewhat hard. Will cont. to monitor and progress as able.  Patient averages MET levels of 1.8-2.0. She has advanced to the treadmill. Currently on 25 liters during exercise. Tires easily. Rates her exertion at a fairly light to somewhat hard. Will cont. to monitor and progress as able.  Patient averages MET levels of 2.0-2.2. Making workload changes on her own which shows initiative. Currently on 25 liters during exercise. Tires easily. Rates her exertion at a fairly light to somewhat hard. Will cont. to monitor and progress as able.      Expected Outcomes Through exercise at rehab and at home patient will increase physical capacity, strength, and stamina.  Through exercise at rehab and at home patient will increase physical capacity, strength, and stamina.  Through exercise at rehab and at home patient will increase physical capacity, strength, and stamina.         Discharge Exercise Prescription (Final Exercise Prescription Changes):     Exercise Prescription Changes - 09/29/16 1200      Response to Exercise   Blood  Pressure (Admit) 124/90   Blood Pressure (Exercise) 150/90   Blood Pressure (Exit) 120/80   Heart Rate (Admit) 82 bpm   Heart Rate (Exercise) 106 bpm   Heart Rate (Exit) 77 bpm   Oxygen Saturation (Admit) 99 %   Oxygen Saturation (Exercise) 92 %   Oxygen Saturation (Exit) 100 %   Rating of Perceived Exertion (Exercise) 11   Perceived Dyspnea (Exercise) 1   Duration Progress to 45 minutes of aerobic exercise without signs/symptoms of physical distress   Intensity THRR unchanged     Progression   Progression Continue to progress workloads to maintain intensity without signs/symptoms of physical distress.     Resistance Training   Training Prescription Yes   Weight orange bands   Reps 10-15   Time 10 Minutes     Interval Training   Interval Training No     Oxygen   Oxygen Continuous   Liters 15-25     Treadmill   MPH 1.6   Grade 2   Minutes 17     NuStep   Level 5   Minutes 17   METs 2.2      Nutrition:  Target Goals: Understanding of nutrition guidelines, daily intake of sodium <1559m, cholesterol <2082m calories 30% from fat and 7% or less from saturated fats, daily to have 5 or more servings of fruits and vegetables.  Biometrics:    Nutrition Therapy Plan and Nutrition Goals:     Nutrition Therapy & Goals - 08/11/16 1351      Nutrition Therapy   Diet Carb Modified, Therapeutic Lifestyle Changes     Personal Nutrition Goals   Nutrition Goal Wt loss of 1-2 lb/week to a wt loss goal of 6-24 lb at graduation from Pulmonary Rehab.     Intervention Plan   Intervention Prescribe, educate and counsel regarding individualized specific dietary modifications aiming towards targeted core components such as weight, hypertension, lipid management, diabetes, heart failure and other comorbidities.   Expected Outcomes Short Term Goal: Understand basic principles of dietary content, such as calories, fat, sodium, cholesterol and nutrients.;Long Term Goal: Adherence to  prescribed nutrition plan.      Nutrition Discharge: Rate Your Plate Scores:     Nutrition Assessments - 08/11/16 1351      Rate Your Plate Scores   Pre Score 54  Nutrition Goals Re-Evaluation:   Nutrition Goals Discharge (Final Nutrition Goals Re-Evaluation):   Psychosocial: Target Goals: Acknowledge presence or absence of significant depression and/or stress, maximize coping skills, provide positive support system. Participant is able to verbalize types and ability to use techniques and skills needed for reducing stress and depression.  Initial Review & Psychosocial Screening:   Quality of Life Scores:   PHQ-9: Recent Review Flowsheet Data    Depression screen [facility] 2/9 05/30/2016 09/23/2014   Decreased Interest 0 0   Down, Depressed, Hopeless 0 0   PHQ - 2 Score 0 0     Interpretation of Total Score  Total Score Depression Severity:  1-4 = Minimal depression, 5-9 = Mild depression, 10-14 = Moderate depression, 15-19 = Moderately severe depression, 20-27 = Severe depression   Psychosocial Evaluation and Intervention:   Psychosocial Re-Evaluation:     Psychosocial Re-Evaluation    [place] Name 08/09/16 1600 09/06/16 1511 10/05/16 1417         Psychosocial Re-Evaluation   Current issues with Current Stress Concerns Current Stress Concerns Current Stress Concerns     Expected Outcomes patiet will remain free from psychosocial barriers and/or identify ways to decrease stress in her life. she has refused a therapist referral. patiet will remain free from psychosocial barriers and/or identify ways to decrease stress in her life. she has refused a therapist referral. patiet will remain free from psychosocial barriers and/or identify ways to decrease stress in her life. she has refused a therapist referral.     Interventions  - Encouraged to attend Pulmonary Rehabilitation for the exercise Encouraged to attend Pulmonary Rehabilitation for the exercise     Continue  Psychosocial Services   - Follow up required by staff Follow up required by staff     Comments  - progression of her disease progression of her disease       Initial Review   Source of Stress Concerns  - Chronic Illness Chronic Illness        Psychosocial Discharge (Final Psychosocial Re-Evaluation):     Psychosocial Re-Evaluation - 10/05/16 1417      Psychosocial Re-Evaluation   Current issues with Current Stress Concerns   Expected Outcomes patiet will remain free from psychosocial barriers and/or identify ways to decrease stress in her life. she has refused a therapist referral.   Interventions Encouraged to attend Pulmonary Rehabilitation for the exercise   Continue Psychosocial Services  Follow up required by staff   Comments progression of her disease     Initial Review   Source of Stress Concerns Chronic Illness      Education: Education Goals: Education classes will be provided on a weekly basis, covering required topics. Participant will state understanding/return demonstration of topics presented.  Learning Barriers/Preferences:   Education Topics: Risk Factor Reduction:  -Group instruction that is supported by a PowerPoint presentation. Instructor discusses the definition of a risk factor, different risk factors for pulmonary disease, and how the heart and lungs work together.     PULMONARY REHAB OTHER RESPIRATORY from 09/29/2016 in [place]  Date  09/01/16  Educator  EP  Instruction Review Code  2- meets goals/outcomes      Nutrition for Pulmonary Patient:  -Group instruction provided by PowerPoint slides, verbal discussion, and written materials to support subject matter. The instructor gives an explanation and review of healthy diet recommendations, which includes a discussion on weight management, recommendations for fruit and vegetable consumption, as well as protein, fluid,  caffeine, fiber, sodium, sugar, and alcohol. Tips  for eating when patients are short of breath are discussed.   PULMONARY REHAB OTHER RESPIRATORY from 09/29/2016 in [place]  Date  09/29/16  Educator  [employer]  Instruction Review Code  R- Review/reinforce      Pursed Lip Breathing:  -Group instruction that is supported by demonstration and informational handouts. Instructor discusses the benefits of pursed lip and diaphragmatic breathing and detailed demonstration on how to preform both.     Oxygen Safety:  -Group instruction provided by PowerPoint, verbal discussion, and written material to support subject matter. There is an overview of "What is Oxygen" and "Why do we need it".  Instructor also reviews how to create a safe environment for oxygen use, the importance of using oxygen as prescribed, and the risks of noncompliance. There is a brief discussion on traveling with oxygen and resources the patient may utilize.   PULMONARY REHAB OTHER RESPIRATORY from 09/29/2016 in [place]  Date  09/15/16  Educator  Truddie Crumble  Instruction Review Code  R- Review/reinforce      Oxygen Equipment:  -Group instruction provided by [facility] Staff utilizing handouts, written materials, and equipment demonstrations.   PULMONARY REHAB OTHER RESPIRATORY from 09/29/2016 in [place]  Date  06/30/16  Educator  Ace Gins  Instruction Review Code  2- meets goals/outcomes      Signs and Symptoms:  -Group instruction provided by written material and verbal discussion to support subject matter. Warning signs and symptoms of infection, stroke, and heart attack are reviewed and when to call the physician/911 reinforced. Tips for preventing the spread of infection discussed.   PULMONARY REHAB OTHER RESPIRATORY from 09/29/2016 in [place]  Date  08/18/16  Educator  RN  Instruction Review Code  2- meets goals/outcomes      Advanced  Directives:  -Group instruction provided by verbal instruction and written material to support subject matter. Instructor reviews Advanced Directive laws and proper instruction for filling out document.   Pulmonary Video:  -Group video education that reviews the importance of medication and oxygen compliance, exercise, good nutrition, pulmonary hygiene, and pursed lip and diaphragmatic breathing for the pulmonary patient.   Exercise for the Pulmonary Patient:  -Group instruction that is supported by a PowerPoint presentation. Instructor discusses benefits of exercise, core components of exercise, frequency, duration, and intensity of an exercise routine, importance of utilizing pulse oximetry during exercise, safety while exercising, and options of places to exercise outside of rehab.     PULMONARY REHAB OTHER RESPIRATORY from 09/29/2016 in [place]  Date  08/04/16  Educator  MD  Instruction Review Code  2- meets goals/outcomes      Pulmonary Medications:  -Verbally interactive group education provided by instructor with focus on inhaled medications and proper administration.   PULMONARY REHAB OTHER RESPIRATORY from 09/29/2016 in [place]  Date  06/23/16  Educator  Pharmacy  Instruction Review Code  2- meets goals/outcomes      Anatomy and Physiology of the Respiratory System and Intimacy:  -Group instruction provided by PowerPoint, verbal discussion, and written material to support subject matter. Instructor reviews respiratory cycle and anatomical components of the respiratory system and their functions. Instructor also reviews differences in obstructive and restrictive respiratory diseases with examples of each. Intimacy, Sex, and Sexuality differences are reviewed with a discussion on how relationships can  change when diagnosed with pulmonary disease. Common sexual concerns are reviewed.   PULMONARY REHAB OTHER  RESPIRATORY from 09/29/2016 in [place]  Date  08/25/16  Educator  RN  Instruction Review Code  2- meets goals/outcomes      MD DAY -A group question and answer session with a medical doctor that allows participants to ask questions that relate to their pulmonary disease state.   PULMONARY REHAB OTHER RESPIRATORY from 09/29/2016 in [place]  Date  08/11/16  Educator  [place]ou[place]  Instruction Review Code  2- meets goals/outcomes      OTHER EDUCATION -Group or individual verbal, written, or video instructions that support the educational goals of the pulmonary rehab program.   Knowledge Questionnaire Score:   Core Components/Risk Factors/Patient Goals at Admission:   Core Components/Risk Factors/Patient Goals Review:      Goals and Risk Factor Review    Row Name 08/09/16 1555 09/06/16 1507 10/05/16 1414         Core Components/Risk Factors/Patient Goals Review   Personal Goals Review Weight Management/Obesity;Develop more efficient breathing techniques such as purse lipped breathing and diaphragmatic breathing and practicing self-pacing with activity.;Improve shortness of breath with ADL's;Increase knowledge of respiratory medications and ability to use respiratory devices properly.;Stress Weight Management/Obesity;Develop more efficient breathing techniques such as purse lipped breathing and diaphragmatic breathing and practicing self-pacing with activity.;Improve shortness of breath with ADL's;Increase knowledge of respiratory medications and ability to use respiratory devices properly.;Stress Weight Management/Obesity;Develop more efficient breathing techniques such as purse lipped breathing and diaphragmatic breathing and practicing self-pacing with activity.;Improve shortness of breath with ADL's;Increase knowledge of respiratory medications and ability to use respiratory devices properly.;Stress     Review patient  has slowly began to loose weight. she is doing very well in pulmonary rehab and works really hard. She is limited by her oxygen consumption and has to take many restbreaks as 25L with a NRB is barely encugh to keep her sats >90 with moderate exertion. she does a great job of self pacing and self monitoring via pulse oximeter. She states she does see some improvement in her shortness of breath however per her recent visit to her pulmonologist at [facility], the patients symptoms are progressing and is set up with Cytoxan infusion at the end of july. will continue to monitor patient closely and follow up on her goals/core components. patient continues to maintain her slow weight loss. she is working very hard in pulmonary rehab and is tolerating small workload increases. she is now walking on the treadmill with 25L NRB. this does induce a cough at times. She has started her Cytoxan infusions and we anticipate her attendance may be affected as this may be hard for her to tolerate. will continue to monitor her closely and extend her sessions to 36 inorder to compensate for any deconditioning the cytoxan may inadvertantly cause. patient continues to maintain her slow weight loss. She continues to work very hard in pulmonary rehab and will increase her own workloads when she feels she is ready. She is maintaining an active lifestyle at home and continues to work out of her home. She has resumed IV Cytoxan therapy which has caused her to miss several days after the infusions. Will continue to monitor her closely to make sure she remains able to meet transplant criteria for exercise.     Expected Outcomes see admission expected outcomes see admission expected outcomes see admission expected outcomes  Core Components/Risk Factors/Patient Goals at Discharge (Final Review):      Goals and Risk Factor Review - 10/05/16 1414      Core Components/Risk Factors/Patient Goals Review   Personal Goals Review Weight  Management/Obesity;Develop more efficient breathing techniques such as purse lipped breathing and diaphragmatic breathing and practicing self-pacing with activity.;Improve shortness of breath with ADL's;Increase knowledge of respiratory medications and ability to use respiratory devices properly.;Stress   Review patient continues to maintain her slow weight loss. She continues to work very hard in pulmonary rehab and will increase her own workloads when she feels she is ready. She is maintaining an active lifestyle at home and continues to work out of her home. She has resumed IV Cytoxan therapy which has caused her to miss several days after the infusions. Will continue to monitor her closely to make sure she remains able to meet transplant criteria for exercise.   Expected Outcomes see admission expected outcomes      ITP Comments:   Comments: ITP REVIEW Pt is making expected slow progress toward pulmonary rehab goals after completing 27 sessions. Her maximum sessions have been increased to 36 do to absences related to the resumption of Cytoxan therapy. Recommend continued exercise, life style modification, education, and utilization of breathing techniques to increase stamina and strength and decrease shortness of breath with exertion.

## 2016-10-06 ENCOUNTER — Encounter (HOSPITAL_COMMUNITY)
Admission: RE | Admit: 2016-10-06 | Discharge: 2016-10-06 | Disposition: A | Discharge status: Home / Self Care | Payer: 59 | Source: Ambulatory Visit | Attending: Internal Medicine | Admitting: Internal Medicine

## 2016-10-06 VITALS — Wt 239.4 lb

## 2016-10-06 DIAGNOSIS — J84113 Idiopathic non-specific interstitial pneumonitis: Secondary | ICD-10-CM

## 2016-10-06 NOTE — Progress Notes (Signed)
Daily Session Note  Patient Details  Name: Felicia [name] MRN: [id number] Date of Birth: 10/13/1970 Referring Provider:     Pulmonary Rehab Walk Test from 06/16/2016 in [place]  Referring Provider  Dr. Nelda Marseille [Dr. [name] ([facility])]      Encounter Date: 10/06/2016  Check In:     Session Check In - 10/06/16 1030      Check-In   Location [facility]   Staff Present Ramon Dredge, RN, MHA;Lawson Isabell Ysidro Evert, RN;Portia Rollene Rotunda, RN, BSN   Supervising physician immediately available to respond to emergencies [facility] immediately available   Physician(s) Dr. Justus Memory   Medication changes reported     No   Fall or balance concerns reported    No   Tobacco Cessation No Change   Warm-up and Cool-down Performed as group-led instruction   Resistance Training Performed Yes   VAD Patient? No     Pain Assessment   Currently in Pain? No/denies   Multiple Pain Sites No      Capillary Blood Glucose: No results found for this or any previous visit (from the past 24 hour(s)).      Exercise Prescription Changes - 10/06/16 1200      Response to Exercise   Blood Pressure (Admit) 120/82   Blood Pressure (Exercise) 120/80   Blood Pressure (Exit) 116/70   Heart Rate (Admit) 82 bpm   Heart Rate (Exercise) 90 bpm   Heart Rate (Exit) 77 bpm   Oxygen Saturation (Admit) 95 %   Oxygen Saturation (Exercise) 96 %   Oxygen Saturation (Exit) 100 %   Rating of Perceived Exertion (Exercise) 12   Perceived Dyspnea (Exercise) 1   Duration Progress to 45 minutes of aerobic exercise without signs/symptoms of physical distress   Intensity THRR New     Progression   Progression Continue to progress workloads to maintain intensity without signs/symptoms of physical distress.     Resistance Training   Training Prescription Yes   Weight orange bands   Reps 10-15   Time 10 Minutes     Interval Training   Interval Training No     Recumbant Bike   Level 2   Minutes 17     NuStep   Level 5   Minutes 17   METs 2.1      History  Smoking Status  . Never Smoker  Smokeless Tobacco  . Never Used    Comment: never smoked or used tobacco products    Goals Met:  Exercise tolerated well No report of cardiac concerns or symptoms Strength training completed today  Goals Unmet:  Not Applicable  Comments: Service time is from 1030 to 1220    Dr. Rush Farmer is Medical Director for Pulmonary Rehab at [facility].

## 2016-10-11 ENCOUNTER — Encounter (HOSPITAL_COMMUNITY)
Admission: RE | Admit: 2016-10-11 | Discharge: 2016-10-11 | Disposition: A | Discharge status: Home / Self Care | Payer: 59 | Source: Ambulatory Visit | Attending: Pulmonary Disease | Admitting: Pulmonary Disease

## 2016-10-11 VITALS — Wt 236.3 lb

## 2016-10-11 DIAGNOSIS — I10 Essential (primary) hypertension: Secondary | ICD-10-CM | POA: Insufficient documentation

## 2016-10-11 DIAGNOSIS — J84113 Idiopathic non-specific interstitial pneumonitis: Secondary | ICD-10-CM | POA: Insufficient documentation

## 2016-10-11 DIAGNOSIS — Z7984 Long term (current) use of oral hypoglycemic drugs: Secondary | ICD-10-CM | POA: Insufficient documentation

## 2016-10-11 DIAGNOSIS — Z79899 Other long term (current) drug therapy: Secondary | ICD-10-CM | POA: Diagnosis not present

## 2016-10-11 NOTE — Progress Notes (Signed)
Daily Session Note  Patient Details  Name: Acadia Thammavong MRN: [id number] Date of Birth: Jan 26, 1971 Referring Provider:     Pulmonary Rehab Walk Test from 06/16/2016 in [place]  Referring Provider  Dr. Nelda Marseille [Dr. [name] ([facility])]      Encounter Date: 10/11/2016  Check In:     Session Check In - 10/11/16 1216      Check-In   Location [facility]   Staff Present Su Hilt, MS, ACSM RCEP, Exercise Physiologist;Jatavion Peaster Ysidro Evert, RN;Other;Portia Rollene Rotunda, RN, BSN   Supervising physician immediately available to respond to emergencies [facility] immediately available   Physician(s) Dr. Bonner Puna   Medication changes reported     No   Fall or balance concerns reported    No   Tobacco Cessation No Change   Warm-up and Cool-down Performed as group-led instruction   Resistance Training Performed Yes   VAD Patient? No     Pain Assessment   Currently in Pain? No/denies   Multiple Pain Sites No      Capillary Blood Glucose: No results found for this or any previous visit (from the past 24 hour(s)).     POCT Glucose - 10/11/16 1216      POCT Blood Glucose   Pre-Exercise 109 mg/dL   Post-Exercise 149 mg/dL         Exercise Prescription Changes - 10/11/16 1200      Response to Exercise   Blood Pressure (Admit) 104/74   Blood Pressure (Exercise) 122/80   Blood Pressure (Exit) 100/88   Heart Rate (Admit) 81 bpm   Heart Rate (Exercise) 103 bpm   Heart Rate (Exit) 84 bpm   Oxygen Saturation (Admit) 99 %   Oxygen Saturation (Exercise) 95 %   Oxygen Saturation (Exit) 100 %   Rating of Perceived Exertion (Exercise) 12   Perceived Dyspnea (Exercise) 0   Duration Progress to 45 minutes of aerobic exercise without signs/symptoms of physical distress   Intensity THRR unchanged     Progression   Progression Continue to progress workloads to maintain intensity without signs/symptoms of physical distress.     Resistance Training   Training Prescription Yes   Weight orange bands   Reps 10-15   Time 10 Minutes     Interval Training   Interval Training No     Oxygen   Oxygen Continuous   Liters 15-25     Treadmill   MPH 1.5   Grade 2   Minutes 17     Recumbant Bike   Level 2.4   Minutes 17     NuStep   Level 5   Minutes 17   METs 1.9      History  Smoking Status  . Never Smoker  Smokeless Tobacco  . Never Used    Comment: never smoked or used tobacco products    Goals Met:  Personal goals reviewed No report of cardiac concerns or symptoms Strength training completed today  Goals Unmet:  Not Applicable  Comments: Service time is from 1030 to 1205    Dr. Rush Farmer is Medical Director for Pulmonary Rehab at [facility].

## 2016-10-13 ENCOUNTER — Encounter (HOSPITAL_COMMUNITY)
Admission: RE | Admit: 2016-10-13 | Discharge: 2016-10-13 | Disposition: A | Discharge status: Home / Self Care | Payer: 59 | Source: Ambulatory Visit | Attending: Pulmonary Disease | Admitting: Pulmonary Disease

## 2016-10-13 DIAGNOSIS — J84113 Idiopathic non-specific interstitial pneumonitis: Secondary | ICD-10-CM

## 2016-10-28 ENCOUNTER — Encounter (HOSPITAL_COMMUNITY): Payer: Self-pay

## 2016-10-28 DIAGNOSIS — J84113 Idiopathic non-specific interstitial pneumonitis: Secondary | ICD-10-CM

## 2016-10-28 NOTE — Progress Notes (Signed)
Discharge Progress Report  Patient Details  Name: Felicia [name] MRN: [id number] Date of Birth: 08/19/1970 Referring Provider:     Pulmonary Rehab Walk Test from 06/16/2016 in [employer]  Referring Provider  Dr. Nelda Marseille [Dr. [name] ([facility])]       Number of Visits: 30  Reason for Discharge:  Patient reached a stable level of exercise. Patient independent in their exercise. Patient has met program and personal goals.  Smoking History:  History  Smoking Status  . Never Smoker  Smokeless Tobacco  . Never Used    Comment: never smoked or used tobacco products    Diagnosis:  Idiopathic non-specific interstitial pneumonitis (HCC)  ADL UCSD:     Pulmonary Assessment Scores    Row Name 10/13/16 1601         ADL UCSD   ADL Phase Exit     SOB Score total 28       CAT Score   CAT Score 17  Exit        Initial Exercise Prescription:   Discharge Exercise Prescription (Final Exercise Prescription Changes):     Exercise Prescription Changes - 10/11/16 1200      Response to Exercise   Blood Pressure (Admit) 104/74   Blood Pressure (Exercise) 122/80   Blood Pressure (Exit) 100/88   Heart Rate (Admit) 81 bpm   Heart Rate (Exercise) 103 bpm   Heart Rate (Exit) 84 bpm   Oxygen Saturation (Admit) 99 %   Oxygen Saturation (Exercise) 95 %   Oxygen Saturation (Exit) 100 %   Rating of Perceived Exertion (Exercise) 12   Perceived Dyspnea (Exercise) 0   Duration Progress to 45 minutes of aerobic exercise without signs/symptoms of physical distress   Intensity THRR unchanged     Progression   Progression Continue to progress workloads to maintain intensity without signs/symptoms of physical distress.     Resistance Training   Training Prescription Yes   Weight orange bands   Reps 10-15   Time 10 Minutes     Interval Training   Interval Training No     Oxygen   Oxygen Continuous   Liters 15-25     Treadmill   MPH 1.5   Grade 2   Minutes 17     Recumbant Bike   Level 2.4   Minutes 17     NuStep   Level 5   Minutes 17   METs 1.9      Functional Capacity:     6 Minute Walk    Row Name 10/13/16 1323         6 Minute Walk   Phase Discharge     Distance 1030 feet     Distance Feet Change 420 ft     Walk Time 6 minutes     # of Rest Breaks 0     MPH 1.95     METS 2.53     RPE 13     Perceived Dyspnea  1     Symptoms Yes (comment)     Comments wheelchair use     Resting HR 77 bpm     Resting BP 122/82     Resting Oxygen Saturation  100 %     Exercise Oxygen Saturation  during 6 min walk 87 %     Max Ex. HR 105 bpm     Max Ex. BP 130/80       Interval HR   1 Minute HR 77  2 Minute HR 88     3 Minute HR 104     4 Minute HR 92     5 Minute HR 105     6 Minute HR 105     Interval Heart Rate? Yes       Interval Oxygen   Interval Oxygen? Yes     Baseline Oxygen Saturation % 100 %     1 Minute Oxygen Saturation % 93 %     1 Minute Liters of Oxygen 25 L     2 Minute Oxygen Saturation % 88 %     2 Minute Liters of Oxygen 25 L     3 Minute Oxygen Saturation % 92 %     3 Minute Liters of Oxygen 25 L     4 Minute Oxygen Saturation % 87 %     4 Minute Liters of Oxygen 25 L     5 Minute Oxygen Saturation % 88 %     5 Minute Liters of Oxygen 25 L     6 Minute Oxygen Saturation % 88 %     6 Minute Liters of Oxygen 25 L        Psychological, QOL, Others - Outcomes: PHQ 2/9: Depression screen [name] 2/9 10/11/2016 05/30/2016 09/23/2014  Decreased Interest 0 0 0  Down, Depressed, Hopeless 0 0 0  PHQ - 2 Score 0 0 0    Quality of Life:   Personal Goals: Goals established at orientation with interventions provided to work toward goal.    Personal Goals Discharge:     Goals and Risk Factor Review    Row Name 09/06/16 1507 10/05/16 1414 10/28/16 1425         Core Components/Risk Factors/Patient Goals Review   Personal Goals Review Weight Management/Obesity;Develop more  efficient breathing techniques such as purse lipped breathing and diaphragmatic breathing and practicing self-pacing with activity.;Improve shortness of breath with ADL's;Increase knowledge of respiratory medications and ability to use respiratory devices properly.;Stress Weight Management/Obesity;Develop more efficient breathing techniques such as purse lipped breathing and diaphragmatic breathing and practicing self-pacing with activity.;Improve shortness of breath with ADL's;Increase knowledge of respiratory medications and ability to use respiratory devices properly.;Stress Weight Management/Obesity;Develop more efficient breathing techniques such as purse lipped breathing and diaphragmatic breathing and practicing self-pacing with activity.;Improve shortness of breath with ADL's;Increase knowledge of respiratory medications and ability to use respiratory devices properly.;Stress     Review patient continues to maintain her slow weight loss. she is working very hard in pulmonary rehab and is tolerating small workload increases. she is now walking on the treadmill with 25L NRB. this does induce a cough at times. She has started her Cytoxan infusions and we anticipate her attendance may be affected as this may be hard for her to tolerate. will continue to monitor her closely and extend her sessions to 36 inorder to compensate for any deconditioning the cytoxan may inadvertantly cause. patient continues to maintain her slow weight loss. She continues to work very hard in pulmonary rehab and will increase her own workloads when she feels she is ready. She is maintaining an active lifestyle at home and continues to work out of her home. She has resumed IV Cytoxan therapy which has caused her to miss several days after the infusions. Will continue to monitor her closely to make sure she remains able to meet transplant criteria for exercise.  -     Expected Outcomes see admission expected outcomes see admission  expected outcomes goals  met at discharge        Exercise Goals and Review:   Nutrition & Weight - Outcomes:    Nutrition:   Nutrition Discharge:     Nutrition Assessments - 10/19/16 1357      Rate Your Plate Scores   Pre Score 54   Post Score 55      Education Questionnaire Score:     Knowledge Questionnaire Score - 10/13/16 1601      Knowledge Questionnaire Score   Post Score 13/13      Goals reviewed with patient; copy given to patient.

## 2017-02-02 ENCOUNTER — Emergency Department (HOSPITAL_COMMUNITY): Payer: 59

## 2017-02-02 ENCOUNTER — Other Ambulatory Visit: Payer: Self-pay

## 2017-02-02 ENCOUNTER — Inpatient Hospital Stay (HOSPITAL_COMMUNITY)
Admission: EM | Admit: 2017-02-02 | Discharge: 2017-02-06 | DRG: 196 | Disposition: A | Discharge status: Home / Self Care | Payer: 59 | Attending: Internal Medicine | Admitting: Internal Medicine

## 2017-02-02 ENCOUNTER — Encounter (HOSPITAL_COMMUNITY): Payer: Self-pay | Admitting: Emergency Medicine

## 2017-02-02 DIAGNOSIS — I5082 Biventricular heart failure: Secondary | ICD-10-CM | POA: Diagnosis present

## 2017-02-02 DIAGNOSIS — R0902 Hypoxemia: Secondary | ICD-10-CM

## 2017-02-02 DIAGNOSIS — J849 Interstitial pulmonary disease, unspecified: Principal | ICD-10-CM | POA: Diagnosis present

## 2017-02-02 DIAGNOSIS — J8489 Other specified interstitial pulmonary diseases: Secondary | ICD-10-CM

## 2017-02-02 DIAGNOSIS — I071 Rheumatic tricuspid insufficiency: Secondary | ICD-10-CM | POA: Diagnosis present

## 2017-02-02 DIAGNOSIS — E119 Type 2 diabetes mellitus without complications: Secondary | ICD-10-CM | POA: Diagnosis present

## 2017-02-02 DIAGNOSIS — Z79899 Other long term (current) drug therapy: Secondary | ICD-10-CM

## 2017-02-02 DIAGNOSIS — Z9981 Dependence on supplemental oxygen: Secondary | ICD-10-CM

## 2017-02-02 DIAGNOSIS — R0609 Other forms of dyspnea: Secondary | ICD-10-CM

## 2017-02-02 DIAGNOSIS — R0600 Dyspnea, unspecified: Secondary | ICD-10-CM

## 2017-02-02 DIAGNOSIS — J9621 Acute and chronic respiratory failure with hypoxia: Secondary | ICD-10-CM | POA: Diagnosis present

## 2017-02-02 DIAGNOSIS — I2729 Other secondary pulmonary hypertension: Secondary | ICD-10-CM | POA: Diagnosis present

## 2017-02-02 DIAGNOSIS — Z7984 Long term (current) use of oral hypoglycemic drugs: Secondary | ICD-10-CM

## 2017-02-02 DIAGNOSIS — I5031 Acute diastolic (congestive) heart failure: Secondary | ICD-10-CM | POA: Diagnosis present

## 2017-02-02 DIAGNOSIS — I10 Essential (primary) hypertension: Secondary | ICD-10-CM

## 2017-02-02 DIAGNOSIS — I110 Hypertensive heart disease with heart failure: Secondary | ICD-10-CM | POA: Diagnosis present

## 2017-02-02 HISTORY — DX: Type 2 diabetes mellitus without complications: E11.9

## 2017-02-02 HISTORY — DX: Other specified interstitial pulmonary diseases: J84.89

## 2017-02-02 LAB — I-STAT TROPONIN, ED: TROPONIN I, POC: 0.01 ng/mL (ref 0.00–0.08)

## 2017-02-02 LAB — CBC
HCT: 38.1 % (ref 36.0–46.0)
HEMOGLOBIN: 11.6 g/dL — AB (ref 12.0–15.0)
MCH: 25.1 pg — AB (ref 26.0–34.0)
MCHC: 30.4 g/dL (ref 30.0–36.0)
MCV: 82.3 fL (ref 78.0–100.0)
PLATELETS: 334 10*3/uL (ref 150–400)
RBC: 4.63 MIL/uL (ref 3.87–5.11)
RDW: 17.8 % — AB (ref 11.5–15.5)
WBC: 7.9 10*3/uL (ref 4.0–10.5)

## 2017-02-02 LAB — BASIC METABOLIC PANEL
ANION GAP: 11 (ref 5–15)
BUN: 12 mg/dL (ref 6–20)
CALCIUM: 9.2 mg/dL (ref 8.9–10.3)
CO2: 26 mmol/L (ref 22–32)
CREATININE: 0.81 mg/dL (ref 0.44–1.00)
Chloride: 102 mmol/L (ref 101–111)
GFR calc Af Amer: >60 mL/min (ref >60)
GLUCOSE: 135 mg/dL — AB (ref 65–99)
Potassium: 3.8 mmol/L (ref 3.5–5.1)
Sodium: 139 mmol/L (ref 135–145)

## 2017-02-02 MED ORDER — ALBUTEROL SULFATE (2.5 MG/3ML) 0.083% IN NEBU
5.0000 mg | INHALATION_SOLUTION | Freq: Once | RESPIRATORY_TRACT | Status: AC
  Administered 2017-02-02: 5 mg via RESPIRATORY_TRACT
  Filled 2017-02-02: qty 6

## 2017-02-02 MED ORDER — DEXTROSE 5 % IV SOLN
500.0000 mg | Freq: Once | INTRAVENOUS | Status: AC
  Administered 2017-02-03: 500 mg via INTRAVENOUS
  Filled 2017-02-02: qty 500

## 2017-02-02 MED ORDER — DEXTROSE 5 % IV SOLN
1.0000 g | Freq: Once | INTRAVENOUS | Status: AC
  Administered 2017-02-02: 1 g via INTRAVENOUS
  Filled 2017-02-02: qty 10

## 2017-02-02 NOTE — ED Notes (Signed)
Patient back from x-ray

## 2017-02-02 NOTE — ED Provider Notes (Signed)
[facility] DEPT Provider Note   CSN: [id number] Arrival date & time: 02/02/17  1956     History   Chief Complaint Chief Complaint  Patient presents with  . Shortness of Breath    HPI Felicia [name] is a 46 y.o. female with a hx of retention, PSVT, interstitial lung disease (on 10 L of oxygen at home) presents to the Emergency Department complaining of gradual, persistent, progressively worsening shortness of breath with cough and wheezing onset approximately 4 days ago.  Patient reports near syncope with ambulation yesterday and associated chills and sweats at home.  She has not measured a fever.  Patient reports that she has been struggling with nasal congestion and cough for almost 2 months.  She reports having discussed this with her pulmonologist several times.  She has completed 2 courses of Augmentin and is completing a prednisone taper at this time without significant relief.  Patient reports that she becomes very dyspneic on exertion.  No significant alleviating factors.  She reports using albuterol at home without significant relief.  She denies headache, neck pain, abdominal pain, nausea, vomiting, full syncope, dysuria, hematuria, leg swelling.  She does report a soreness in her left chest that is significantly worse with coughing.  No cardiac history aside from her SVT.  She reports compliance with all her medications.  Her next follow-up with pulmonology at [name] is February 17, 2017.  She is taking immunosuppressives (Cellcept 1529m BID).       The history is provided by the patient and medical records. No language interpreter was used.    Past Medical History:  Diagnosis Date  . Hypertension   . NSIP (nonspecific interstitial pneumonitis) ([facility]     Patient Active Problem List   Diagnosis Date Noted  . NS[facility] (nonspecific interstitial pneumonitis) ([facility] 02/03/2017  . Acute on chronic respiratory failure with hypoxia ([facility] 02/03/2017  .  PSVT (paroxysmal supraventricular tachycardia) ([facility] 09/23/2014  . [place] 09/23/2014  . UNSPEC ALVEOLAR&PARIETOALVEOLAR PNEUMONOPATHY 09/12/2008  . HYPERTENSION 09/11/2008  . BRONCHITIS 09/11/2008    History reviewed. No pertinent surgical history.  OB History    No data available       Home Medications    Prior to Admission medications   Medication Sig Start Date End Date Taking? Authorizing Provider  amoxicillin-clavulanate (AUGMENTIN) 875-125 MG tablet Take 1 tablet by mouth every 12 (twelve) hours.  01/09/17  Yes [provider]  atenolol (TENORMIN) 50 MG tablet Take 75 mg by mouth 2 (two) times daily.  01/08/17  Yes [provider]  guaiFENesin (MUCINEX) 600 MG 12 hr tablet Take 600 mg by mouth daily as needed for to loosen phlegm.   Yes [provider]  hydrochlorothiazide (HYDRODIURIL) 25 MG tablet Take 25 mg by mouth daily. 06/30/14  Yes [provider]  ipratropium-albuterol (DUONEB) 0.5-2.5 (3) MG/3ML SOLN Inhale 3 mLs into the lungs every 6 (six) hours as needed (sob and wheezing).  01/08/17  Yes [provider]  metFORMIN (GLUCOPHAGE) 500 MG tablet Take 500 mg by mouth 2 (two) times daily with a meal.    Yes [provider]  mycophenolate (CELLCEPT) 500 MG tablet Take 1,500 mg by mouth 2 (two) times daily. 3 tablets (1500 mg) bid   Yes [provider]  predniSONE (DELTASONE) 10 MG tablet Take 10-40 mg by mouth as directed. Take 4 tablets daily for 4 Days, Take 3 tablets daily for 3 Days, Take 2 tablets daily for 2 Days, Take  1 tablet daily for 1 Day.   Yes [provider]  verapamil (CALAN-SR) 120 MG CR tablet Take 1 tablet (120 mg total) by mouth at bedtime. Patient taking differently: Take 120 mg by mouth every morning.  09/23/14  Yes Etta Quill, NP  albuterol (PROVENTIL HFA;VENTOLIN HFA) 108 (90 Base) MCG/ACT inhaler Inhale 1 puff into the lungs every 6 (six) hours as needed for  wheezing or shortness of breath.    [provider]    Family History Family History  Problem Relation Age of Onset  . Hypertension Mother   . Cancer Father   . Diabetes Father   . Hypertension Father   . Diabetes Sister   . Mental retardation Sister   . Cancer Maternal Grandmother   . Cancer Maternal Grandfather   . Cancer Paternal Grandmother   . Cancer Paternal Grandfather     Social History Social History   Tobacco Use  . Smoking status: Never Smoker  . Smokeless tobacco: Never Used  . Tobacco comment: never smoked or used tobacco products  Substance Use Topics  . Alcohol use: No  . Drug use: No     Allergies   Patient has no known allergies.   Review of Systems Review of Systems  Constitutional: Positive for chills and fatigue. Negative for appetite change, diaphoresis, fever and unexpected weight change.  HENT: Positive for congestion and sinus pressure. Negative for mouth sores and sore throat.   Eyes: Negative for visual disturbance.  Respiratory: Positive for cough, chest tightness, shortness of breath and wheezing.   Cardiovascular: Positive for chest pain. Negative for palpitations and leg swelling.  Gastrointestinal: Negative for abdominal pain, constipation, diarrhea, nausea and vomiting.  Endocrine: Negative for polydipsia, polyphagia and polyuria.  Genitourinary: Negative for dysuria, frequency, hematuria and urgency.  Musculoskeletal: Negative for back pain and neck stiffness.  Skin: Negative for rash.  Allergic/Immunologic: Negative for immunocompromised state.  Neurological: Negative for syncope, light-headedness and headaches.  Hematological: Does not bruise/bleed easily.  Psychiatric/Behavioral: Negative for sleep disturbance. The patient is not nervous/anxious.      Physical Exam Updated Vital Signs BP 133/81 (BP Location: Left Arm)   Pulse 92   Temp 98.2 F (36.8 C) (Oral)   Resp (!) 33   Ht _0  (1.651 m)   Wt 102.5 kg  (226 lb)   SpO2 92%   BMI 37.61 kg/m   Physical Exam  Constitutional: She appears well-developed and well-nourished. No distress.  Awake, alert, nontoxic appearance  HENT:  Head: Normocephalic and atraumatic.  Mouth/Throat: Oropharynx is clear and moist. No oropharyngeal exudate.  Eyes: Conjunctivae are normal. No scleral icterus.  Neck: Normal range of motion. Neck supple.  Cardiovascular: Normal rate, regular rhythm and intact distal pulses.  Pulmonary/Chest: Accessory muscle usage present. Tachypnea noted. She has decreased breath sounds. She has wheezes ( faint, expiratory).  Equal chest expansion  Abdominal: Soft. Bowel sounds are normal. She exhibits no mass. There is no tenderness. There is no rebound and no guarding.  Musculoskeletal: Normal range of motion. She exhibits no edema.       Right lower leg: She exhibits no edema.  No peripheral edema No calf tenderness  Neurological: She is alert.  Speech is clear and goal oriented Moves extremities without ataxia  Skin: Skin is warm and dry. She is not diaphoretic.  Psychiatric: She has a normal mood and affect.  Nursing note and vitals reviewed.    ED Treatments / Results  Labs (all labs ordered  are listed, but only abnormal results are displayed) Labs Reviewed  BASIC METABOLIC PANEL - Abnormal; Notable for the following components:      Result Value   Glucose, Bld 135 (*)    All other components within normal limits  CBC - Abnormal; Notable for the following components:   Hemoglobin 11.6 (*)    MCH 25.1 (*)    RDW 17.8 (*)    All other components within normal limits  CULTURE, EXPECTORATED SPUTUM-ASSESSMENT  HIV ANTIBODY (ROUTINE TESTING)  BRAIN NATRIURETIC PEPTIDE  I-STAT TROPONIN, ED    EKG  EKG Interpretation  Date/Time:  Thursday February 02 2017 21:13:56 EST Ventricular Rate:  93 PR Interval:    QRS Duration: 108 QT Interval:  391 QTC Calculation: 487 R Axis:   73 Text Interpretation:  Sinus  rhythm Probable left atrial enlargement Low voltage, precordial leads Abnormal T, consider ischemia, anterior leads since previous tracing, rate slower, questionable T wave inversions inferiorly but limited by artifact Confirmed by Theotis Burrow [phone]) on 02/03/2017 3:37:12 AM       Radiology Dg Chest 2 View  Result Date: 02/02/2017 CLINICAL DATA:  Shortness of Breath EXAM: CHEST  2 VIEW COMPARISON:  October 01, 2012 FINDINGS: There is cardiomegaly with pulmonary venous hypertension. There is widespread interstitial thickening with patchy areas of consolidation in the bases. No evident adenopathy. No bone lesions. IMPRESSION: Diffuse, chronic interstitial thickening, consistent with chronic interstitial lung disease. Patchy opacity in the bases may represent chronic atelectasis or potential superimposed pneumonia. There is cardiomegaly with mild pulmonary venous hypertension. There is felt to be a degree of underlying pulmonary vascular congestion. Electronically Signed   By: Lowella Grip III M.D.   On: 02/02/2017 22:00    Procedures Procedures (including critical care time)  Medications Ordered in ED Medications  atenolol (TENORMIN) tablet 75 mg (not administered)  ipratropium-albuterol (DUONEB) 0.5-2.5 (3) MG/3ML nebulizer solution 3 mL (not administered)  mycophenolate (CELLCEPT) tablet 1,500 mg (not administered)  verapamil (CALAN-SR) CR tablet 120 mg (not administered)  hydrochlorothiazide (HYDRODIURIL) tablet 25 mg (not administered)  acetaminophen (TYLENOL) tablet 650 mg (not administered)    Or  acetaminophen (TYLENOL) suppository 650 mg (not administered)  ondansetron (ZOFRAN) tablet 4 mg (not administered)    Or  ondansetron (ZOFRAN) injection 4 mg (not administered)  enoxaparin (LOVENOX) injection 40 mg (not administered)  predniSONE (DELTASONE) tablet 10-40 mg (not administered)  albuterol (PROVENTIL) (2.5 MG/3ML) 0.083% nebulizer solution 2.5 mg (not administered)    albuterol (PROVENTIL) (2.5 MG/3ML) 0.083% nebulizer solution 5 mg (5 mg Nebulization Given 02/02/17 2153)  cefTRIAXone (ROCEPHIN) 1 g in dextrose 5 % 50 mL IVPB (0 g Intravenous Stopped 02/02/17 2357)  azithromycin (ZITHROMAX) 500 mg in dextrose 5 % 250 mL IVPB (0 mg Intravenous Stopped 02/03/17 0106)  albuterol (PROVENTIL) (2.5 MG/3ML) 0.083% nebulizer solution 5 mg (5 mg Nebulization Given 02/03/17 0008)     Initial Impression / Assessment and Plan / ED Course  I have reviewed the triage vital signs and the nursing notes.  Pertinent labs & imaging results that were available during my care of the patient were reviewed by me and considered in my medical decision making (see chart for details).  Clinical Course as of Feb 04 331  Fri Feb 03, 2017  0008 Discussed with Dr. Alcario Drought ([facility]) who feels pt will be best suited for admission at [facility] where she is followed by pulmonology.    [HM]    Clinical Course User Index [HM] [place], [place], [place]  Patient presents the emergency department with respiratory distress, cough, congestion and severe dyspnea on exertion.  She denies leg swelling or other symptoms of heart failure.  She has no history of congestive heart failure.  Patient does wear oxygen at home and presents with 10 L of oxygen however is found to be hypoxic upon arrival.  Patient given breathing treatment with some improvement in lung sounds however she continues to be tachypneic with accessory muscle usage.  Chest x-ray with concern for superimposed pneumonia.  She has failed outpatient antibiotic therapy and prednisone therapy and is immunosuppressed. She will need admission for respiratory distress, pneumonia and failed outpatient therapy.  [facility] Pulmonology: [facility]  Final Clinical Impressions(s) / ED Diagnoses   Final diagnoses:  Hypoxia  Dyspnea on exertion  Interstitial pneumonitis [facility])  On home oxygen therapy    ED Discharge Orders     None       Loni Muse Gwenlyn Perking 02/03/17 0406    Sherwood Gambler, MD 02/04/17 [phone]

## 2017-02-02 NOTE — ED Notes (Signed)
Patient transported to X-ray

## 2017-02-02 NOTE — ED Triage Notes (Signed)
Pt from home with complaints of shortness of breath. Patient has interstitial lung disease and normally wears 10L [place] [place] at home. Patient started having shortness of breath a few days ago, attempted a home breathing treatment but had no success. Patient sees pulmonology at [facility].

## 2017-02-03 ENCOUNTER — Inpatient Hospital Stay (HOSPITAL_COMMUNITY): Payer: 59

## 2017-02-03 ENCOUNTER — Encounter (HOSPITAL_COMMUNITY): Payer: Self-pay | Admitting: Internal Medicine

## 2017-02-03 DIAGNOSIS — J8489 Other specified interstitial pulmonary diseases: Secondary | ICD-10-CM | POA: Diagnosis not present

## 2017-02-03 DIAGNOSIS — Z7984 Long term (current) use of oral hypoglycemic drugs: Secondary | ICD-10-CM | POA: Diagnosis not present

## 2017-02-03 DIAGNOSIS — I10 Essential (primary) hypertension: Secondary | ICD-10-CM | POA: Diagnosis not present

## 2017-02-03 DIAGNOSIS — J849 Interstitial pulmonary disease, unspecified: Secondary | ICD-10-CM | POA: Diagnosis present

## 2017-02-03 DIAGNOSIS — R0609 Other forms of dyspnea: Secondary | ICD-10-CM | POA: Diagnosis not present

## 2017-02-03 DIAGNOSIS — I110 Hypertensive heart disease with heart failure: Secondary | ICD-10-CM | POA: Diagnosis present

## 2017-02-03 DIAGNOSIS — I272 Pulmonary hypertension, unspecified: Secondary | ICD-10-CM | POA: Diagnosis not present

## 2017-02-03 DIAGNOSIS — R0902 Hypoxemia: Secondary | ICD-10-CM

## 2017-02-03 DIAGNOSIS — J9621 Acute and chronic respiratory failure with hypoxia: Secondary | ICD-10-CM | POA: Diagnosis present

## 2017-02-03 DIAGNOSIS — I361 Nonrheumatic tricuspid (valve) insufficiency: Secondary | ICD-10-CM | POA: Diagnosis not present

## 2017-02-03 DIAGNOSIS — E119 Type 2 diabetes mellitus without complications: Secondary | ICD-10-CM | POA: Diagnosis present

## 2017-02-03 DIAGNOSIS — Z9981 Dependence on supplemental oxygen: Secondary | ICD-10-CM

## 2017-02-03 DIAGNOSIS — I2729 Other secondary pulmonary hypertension: Secondary | ICD-10-CM | POA: Diagnosis present

## 2017-02-03 DIAGNOSIS — R0600 Dyspnea, unspecified: Secondary | ICD-10-CM

## 2017-02-03 DIAGNOSIS — I5031 Acute diastolic (congestive) heart failure: Secondary | ICD-10-CM | POA: Diagnosis present

## 2017-02-03 DIAGNOSIS — I5082 Biventricular heart failure: Secondary | ICD-10-CM | POA: Diagnosis present

## 2017-02-03 DIAGNOSIS — I071 Rheumatic tricuspid insufficiency: Secondary | ICD-10-CM | POA: Diagnosis present

## 2017-02-03 DIAGNOSIS — Z79899 Other long term (current) drug therapy: Secondary | ICD-10-CM | POA: Diagnosis not present

## 2017-02-03 LAB — GLUCOSE, CAPILLARY
Glucose-Capillary: 78 mg/dL (ref 65–99)
Glucose-Capillary: 154 mg/dL — ABNORMAL HIGH (ref 65–99)
Glucose-Capillary: 235 mg/dL — ABNORMAL HIGH (ref 65–99)
Glucose-Capillary: 174 mg/dL — ABNORMAL HIGH (ref 65–99)

## 2017-02-03 LAB — ECHOCARDIOGRAM COMPLETE
AOASC: 33 cm
CHL CUP REG VEL DIAS: 133 cm/s
E decel time: 194 msec
FS: 31 % (ref 28–44)
HEIGHTINCHES: 65 in
IVS/LV PW RATIO, ED: 1
LA ID, A-P, ES: 36 mm
LA diam end sys: 36 mm
LA diam index: 1.62 cm/m2
LA vol A4C: 42.6 ml
LAVOL: 48.9 mL
LAVOLIN: 22 mL/m2
LV PW d: 9.47 mm — AB (ref 0.6–1.1)
LVOT area: 3.46 cm2
LVOTD: 21 mm
MV Dec: 194
MV Peak grad: 3 mmHg
MVPKAVEL: 68.4 m/s
MVPKEVEL: 82 m/s
PV Reg grad dias: 7 mmHg
Reg peak vel: 394 cm/s
TAPSE: 20.3 mm
TR max vel: 394 cm/s
WEIGHTICAEL: 3650.82 [oz_av]

## 2017-02-03 LAB — RESPIRATORY PANEL BY PCR
ADENOVIRUS-RVPPCR: NOT DETECTED
Bordetella pertussis: NOT DETECTED
CHLAMYDOPHILA PNEUMONIAE-RVPPCR: NOT DETECTED
CORONAVIRUS HKU1-RVPPCR: NOT DETECTED
CORONAVIRUS NL63-RVPPCR: NOT DETECTED
Coronavirus 229E: NOT DETECTED
Coronavirus OC43: NOT DETECTED
Influenza A: NOT DETECTED
Influenza B: NOT DETECTED
MYCOPLASMA PNEUMONIAE-RVPPCR: NOT DETECTED
Metapneumovirus: NOT DETECTED
PARAINFLUENZA VIRUS 3-RVPPCR: NOT DETECTED
Parainfluenza Virus 1: NOT DETECTED
Parainfluenza Virus 2: NOT DETECTED
Parainfluenza Virus 4: NOT DETECTED
RHINOVIRUS / ENTEROVIRUS - RVPPCR: NOT DETECTED
Respiratory Syncytial Virus: NOT DETECTED

## 2017-02-03 LAB — MRSA PCR SCREENING: MRSA BY PCR: NEGATIVE

## 2017-02-03 LAB — EXPECTORATED SPUTUM ASSESSMENT W GRAM STAIN, RFLX TO RESP C

## 2017-02-03 LAB — BRAIN NATRIURETIC PEPTIDE: B NATRIURETIC PEPTIDE 5: 475.8 pg/mL — AB (ref 0.0–100.0)

## 2017-02-03 LAB — HEMOGLOBIN A1C
HEMOGLOBIN A1C: 6.3 % — AB (ref 4.8–5.6)
MEAN PLASMA GLUCOSE: 134.11 mg/dL

## 2017-02-03 LAB — HIV ANTIBODY (ROUTINE TESTING W REFLEX): HIV SCREEN 4TH GENERATION: NONREACTIVE

## 2017-02-03 LAB — PROCALCITONIN: Procalcitonin: <0.1 ng/mL

## 2017-02-03 MED ORDER — ALBUTEROL SULFATE (2.5 MG/3ML) 0.083% IN NEBU
5.0000 mg | INHALATION_SOLUTION | Freq: Once | RESPIRATORY_TRACT | Status: AC
  Administered 2017-02-03: 5 mg via RESPIRATORY_TRACT
  Filled 2017-02-03: qty 6

## 2017-02-03 MED ORDER — PREDNISONE 20 MG PO TABS: 20.0000 mg | ORAL_TABLET | Freq: Every day | ORAL | Status: DC

## 2017-02-03 MED ORDER — ALBUTEROL SULFATE HFA 108 (90 BASE) MCG/ACT IN AERS: 1.0000 | INHALATION_SPRAY | Freq: Four times a day (QID) | RESPIRATORY_TRACT | Status: DC | PRN

## 2017-02-03 MED ORDER — ONDANSETRON HCL 4 MG/2ML IJ SOLN: 4.0000 mg | Freq: Four times a day (QID) | INTRAMUSCULAR | Status: DC | PRN

## 2017-02-03 MED ORDER — HYDROCHLOROTHIAZIDE 25 MG PO TABS
25.0000 mg | ORAL_TABLET | Freq: Every day | ORAL | Status: DC
  Administered 2017-02-03 – 2017-02-06 (×4): 25 mg via ORAL
  Filled 2017-02-03 (×4): qty 1

## 2017-02-03 MED ORDER — ATENOLOL 50 MG PO TABS
75.0000 mg | ORAL_TABLET | Freq: Two times a day (BID) | ORAL | Status: DC
  Administered 2017-02-03 – 2017-02-06 (×7): 75 mg via ORAL
  Filled 2017-02-03 (×7): qty 1

## 2017-02-03 MED ORDER — IPRATROPIUM-ALBUTEROL 0.5-2.5 (3) MG/3ML IN SOLN: 3.0000 mL | Freq: Four times a day (QID) | RESPIRATORY_TRACT | Status: DC | PRN

## 2017-02-03 MED ORDER — ALBUTEROL SULFATE (2.5 MG/3ML) 0.083% IN NEBU: 2.5000 mg | INHALATION_SOLUTION | RESPIRATORY_TRACT | Status: DC | PRN

## 2017-02-03 MED ORDER — AZITHROMYCIN 250 MG PO TABS
500.0000 mg | ORAL_TABLET | ORAL | Status: DC
  Administered 2017-02-03 – 2017-02-05 (×3): 500 mg via ORAL
  Filled 2017-02-03 (×3): qty 2

## 2017-02-03 MED ORDER — IPRATROPIUM-ALBUTEROL 0.5-2.5 (3) MG/3ML IN SOLN
3.0000 mL | Freq: Four times a day (QID) | RESPIRATORY_TRACT | Status: DC
  Administered 2017-02-03: 3 mL via RESPIRATORY_TRACT
  Filled 2017-02-03: qty 3

## 2017-02-03 MED ORDER — INSULIN ASPART 100 UNIT/ML ~~LOC~~ SOLN
0.0000 [IU] | Freq: Three times a day (TID) | SUBCUTANEOUS | Status: DC
  Administered 2017-02-03: 2 [IU] via SUBCUTANEOUS
  Administered 2017-02-03: 3 [IU] via SUBCUTANEOUS
  Administered 2017-02-04: 2 [IU] via SUBCUTANEOUS
  Administered 2017-02-04: 3 [IU] via SUBCUTANEOUS
  Administered 2017-02-04 – 2017-02-05 (×3): 2 [IU] via SUBCUTANEOUS
  Administered 2017-02-05: 3 [IU] via SUBCUTANEOUS
  Administered 2017-02-06: 2 [IU] via SUBCUTANEOUS

## 2017-02-03 MED ORDER — VERAPAMIL HCL ER 120 MG PO TBCR
120.0000 mg | EXTENDED_RELEASE_TABLET | Freq: Every day | ORAL | Status: DC
  Administered 2017-02-03 – 2017-02-06 (×4): 120 mg via ORAL
  Filled 2017-02-03 (×4): qty 1

## 2017-02-03 MED ORDER — IPRATROPIUM-ALBUTEROL 0.5-2.5 (3) MG/3ML IN SOLN
3.0000 mL | Freq: Three times a day (TID) | RESPIRATORY_TRACT | Status: DC
  Administered 2017-02-03 – 2017-02-06 (×9): 3 mL via RESPIRATORY_TRACT
  Filled 2017-02-03 (×9): qty 3

## 2017-02-03 MED ORDER — MYCOPHENOLATE MOFETIL 250 MG PO CAPS
1500.0000 mg | ORAL_CAPSULE | Freq: Two times a day (BID) | ORAL | Status: DC
  Administered 2017-02-03 – 2017-02-06 (×7): 1500 mg via ORAL
  Filled 2017-02-03 (×8): qty 6

## 2017-02-03 MED ORDER — ENOXAPARIN SODIUM 40 MG/0.4ML ~~LOC~~ SOLN
40.0000 mg | SUBCUTANEOUS | Status: DC
  Administered 2017-02-03 – 2017-02-06 (×4): 40 mg via SUBCUTANEOUS
  Filled 2017-02-03 (×4): qty 0.4

## 2017-02-03 MED ORDER — ONDANSETRON HCL 4 MG PO TABS: 4.0000 mg | ORAL_TABLET | Freq: Four times a day (QID) | ORAL | Status: DC | PRN

## 2017-02-03 MED ORDER — PREDNISONE 10 MG PO TABS: 10.0000 mg | ORAL_TABLET | Freq: Every day | ORAL | Status: DC

## 2017-02-03 MED ORDER — PREDNISONE 20 MG PO TABS: 30.0000 mg | ORAL_TABLET | Freq: Every day | ORAL | Status: DC

## 2017-02-03 MED ORDER — METHYLPREDNISOLONE SODIUM SUCC 40 MG IJ SOLR
40.0000 mg | Freq: Four times a day (QID) | INTRAMUSCULAR | Status: DC
  Administered 2017-02-03 – 2017-02-06 (×13): 40 mg via INTRAVENOUS
  Filled 2017-02-03 (×13): qty 1

## 2017-02-03 MED ORDER — ORAL CARE MOUTH RINSE
15.0000 mL | Freq: Two times a day (BID) | OROMUCOSAL | Status: DC
  Administered 2017-02-03 – 2017-02-06 (×7): 15 mL via OROMUCOSAL

## 2017-02-03 MED ORDER — METHYLPREDNISOLONE SODIUM SUCC 40 MG IJ SOLR: 40.0000 mg | Freq: Three times a day (TID) | INTRAMUSCULAR | Status: DC

## 2017-02-03 MED ORDER — ACETAMINOPHEN 650 MG RE SUPP: 650.0000 mg | Freq: Four times a day (QID) | RECTAL | Status: DC | PRN

## 2017-02-03 MED ORDER — ACETAMINOPHEN 325 MG PO TABS: 650.0000 mg | ORAL_TABLET | Freq: Four times a day (QID) | ORAL | Status: DC | PRN

## 2017-02-03 MED ORDER — GUAIFENESIN ER 600 MG PO TB12
600.0000 mg | ORAL_TABLET | Freq: Two times a day (BID) | ORAL | Status: DC
  Administered 2017-02-03 – 2017-02-06 (×7): 600 mg via ORAL
  Filled 2017-02-03 (×7): qty 1

## 2017-02-03 NOTE — H&P (Signed)
History and Physical    Felicia [name] DTO:[id number] DOB: October 09, 1970 DOA: 02/02/2017  PCP: System, Pcp Not In  Patient coming from: Home  I have personally briefly reviewed patient's old medical records in [employer]  Chief Complaint: SOB  HPI: Felicia [name] is a 46 y.o. female with medical history significant of nonspecific interstitial pneumonitis.  She is on 6-8L O2 at rest, 10L O2 with activity at baseline at home.  She is followed by [facility] Pulmonology.  She presents to the ED with progressively worsening SOB and cough onset about 4 days ago.  Near syncope with ambulation yesterday.  No fever.  Has had nasal congestion and cough for past 2 months.  Has completed 2 courses of augmentin and is completing a prednsione taper at the moment (still on 51m a day).  She is normally on 159m/ day prednisone and 150072maily of celcept for her NSIP.   ED Course: CXR shows severe ILD, possible superimposed PNA at bases, "mild" pulm HTN, and "a degree of pulmonary venous congestion".  No fever, no WBC, no SRIS.  EDP gave rocephin / azithromycin x 1 dose in ED.   Review of Systems: As per HPI otherwise 10 point review of systems negative.   Past Medical History:  Diagnosis Date  . Hypertension     History reviewed. No pertinent surgical history.   reports that  has never smoked. she has never used smokeless tobacco. She reports that she does not drink alcohol or use drugs.  No Known Allergies  Family History  Problem Relation Age of Onset  . Hypertension Mother   . Cancer Father   . Diabetes Father   . Hypertension Father   . Diabetes Sister   . Mental retardation Sister   . Cancer Maternal Grandmother   . Cancer Maternal Grandfather   . Cancer Paternal Grandmother   . Cancer Paternal Grandfather      Prior to Admission medications   Medication Sig Start Date End Date Taking? Authorizing Provider  amoxicillin-clavulanate (AUGMENTIN) 875-125 MG tablet Take 1  tablet by mouth every 12 (twelve) hours.  01/09/17  Yes [provider]  atenolol (TENORMIN) 50 MG tablet Take 75 mg by mouth 2 (two) times daily.  01/08/17  Yes [provider]  guaiFENesin (MUCINEX) 600 MG 12 hr tablet Take 600 mg by mouth daily as needed for to loosen phlegm.   Yes [provider]  hydrochlorothiazide (HYDRODIURIL) 25 MG tablet Take 25 mg by mouth daily. 06/30/14  Yes [provider]  ipratropium-albuterol (DUONEB) 0.5-2.5 (3) MG/3ML SOLN Inhale 3 mLs into the lungs every 6 (six) hours as needed (sob and wheezing).  01/08/17  Yes [provider]  metFORMIN (GLUCOPHAGE) 500 MG tablet Take 500 mg by mouth 2 (two) times daily with a meal.    Yes [provider]  mycophenolate (CELLCEPT) 500 MG tablet Take 1,500 mg by mouth 2 (two) times daily. 3 tablets (1500 mg) bid   Yes [provider]  predniSONE (DELTASONE) 10 MG tablet Take 10-40 mg by mouth as directed. Take 4 tablets daily for 4 Days, Take 3 tablets daily for 3 Days, Take 2 tablets daily for 2 Days, Take 1 tablet daily for 1 Day.   Yes [provider]  verapamil (CALAN-SR) 120 MG CR tablet Take 1 tablet (120 mg total) by mouth at bedtime. Patient taking differently: Take 120 mg by mouth every morning.  09/23/14  Yes SmiEtta QuillP  albuterol (PROVENTIL HFA;VENTOLIN  HFA) 108 (90 Base) MCG/ACT inhaler Inhale 1 puff into the lungs every 6 (six) hours as needed for wheezing or shortness of breath.    [provider]    Physical Exam: Vitals:   02/02/17 2300 02/03/17 0043 02/03/17 0100 02/03/17 0227  BP: (!) 153/86 (!) 142/90 (!) 179/99 (!) 148/115  Pulse: 83 88 93 84  Resp: (!) 31 (!) 26 (!) 34 (!) 29  Temp:      TempSrc:      SpO2: 100% 99% 100% 99%  Weight:      Height:        Constitutional: NAD, calm, comfortable Eyes: PERRL, lids and conjunctivae normal ENMT: Mucous membranes are moist. Posterior pharynx clear of any exudate or  lesions.Normal dentition.  Neck: normal, supple, no masses, no thyromegaly Respiratory: Decreased breath sounds Cardiovascular: Regular rate and rhythm, no murmurs / rubs / gallops. No extremity edema. 2+ pedal pulses. No carotid bruits.  Abdomen: no tenderness, no masses palpated. No hepatosplenomegaly. Bowel sounds positive.  Musculoskeletal: no clubbing / cyanosis. No joint deformity upper and lower extremities. Good ROM, no contractures. Normal muscle tone.  Skin: no rashes, lesions, ulcers. No induration Neurologic: CN 2-12 grossly intact. Sensation intact, DTR normal. Strength 5/5 in all 4.  Psychiatric: Normal judgment and insight. Alert and oriented x 3. Normal mood.    Labs on Admission: I have personally reviewed following labs and imaging studies  CBC: Recent Labs  Lab 02/02/17 2130  WBC 7.9  HGB 11.6*  HCT 38.1  MCV 82.3  PLT 324   Basic Metabolic Panel: Recent Labs  Lab 02/02/17 2130  NA 139  K 3.8  CL 102  CO2 26  GLUCOSE 135*  BUN 12  CREATININE 0.81  CALCIUM 9.2   GFR: Estimated Creatinine Clearance: 101.9 mL/min (by C-G formula based on SCr of 0.81 mg/dL). Liver Function Tests: No results for input(s): AST, ALT, ALKPHOS, BILITOT, PROT, ALBUMIN in the last 168 hours. No results for input(s): LIPASE, AMYLASE in the last 168 hours. No results for input(s): AMMONIA in the last 168 hours. Coagulation Profile: No results for input(s): INR, PROTIME in the last 168 hours. Cardiac Enzymes: No results for input(s): CKTOTAL, CKMB, CKMBINDEX, TROPONINI in the last 168 hours. BNP (last 3 results) No results for input(s): PROBNP in the last 8760 hours. HbA1C: No results for input(s): HGBA1C in the last 72 hours. CBG: No results for input(s): GLUCAP in the last 168 hours. Lipid Profile: No results for input(s): CHOL, HDL, LDLCALC, TRIG, CHOLHDL, LDLDIRECT in the last 72 hours. Thyroid Function Tests: No results for input(s): TSH, T4TOTAL, FREET4, T3FREE,  THYROIDAB in the last 72 hours. Anemia Panel: No results for input(s): VITAMINB12, FOLATE, FERRITIN, TIBC, IRON, RETICCTPCT in the last 72 hours. Urine analysis:    Component Value Date/Time   COLORURINE YELLOW 12/24/2008 1604   APPEARANCEUR CLOUDY (A) 12/24/2008 1604   LABSPEC 1.019 12/24/2008 1604   PHURINE 7.5 12/24/2008 1604   GLUCOSEU NEGATIVE 12/24/2008 1604   HGBUR NEGATIVE 12/24/2008 1604   BILIRUBINUR NEGATIVE 12/24/2008 [place] 12/24/2008 1604   PROTEINUR NEGATIVE 12/24/2008 1604   UROBILINOGEN 0.2 12/24/2008 1604   NITRITE NEGATIVE 12/24/2008 1604   LEUKOCYTESUR MODERATE (A) 12/24/2008 1604    Radiological Exams on Admission: Dg Chest 2 View  Result Date: 02/02/2017 CLINICAL DATA:  Shortness of Breath EXAM: CHEST  2 VIEW COMPARISON:  October 01, 2012 FINDINGS: There is cardiomegaly with pulmonary venous hypertension. There is widespread interstitial thickening  with patchy areas of consolidation in the bases. No evident adenopathy. No bone lesions. IMPRESSION: Diffuse, chronic interstitial thickening, consistent with chronic interstitial lung disease. Patchy opacity in the bases may represent chronic atelectasis or potential superimposed pneumonia. There is cardiomegaly with mild pulmonary venous hypertension. There is felt to be a degree of underlying pulmonary vascular congestion. Electronically Signed   By: Lowella Grip III M.D.   On: 02/02/2017 22:00    EKG: Independently reviewed.  Assessment/Plan Principal Problem:   Acute on chronic respiratory failure with hypoxia (HCC) Active Problems:   Interstitial pneumonitis (HCC)   NSIP (nonspecific interstitial pneumonitis) ([place])    1. Acute on chronic resp failure with hypoxia - Chronic NSIP, either worsening of NSIP possibly superimposed PNA, possibly superimposed pulm HTN 1. Recommended transfer to [facility], given that her pulmonologist care has been there, our last CXR for comparison is from 2014  and our last CT for comparison is from 2010.  Patient declined transfer and wants to be admitted here. 2. Will therefore admit. 3. Will leave her on the current prednisone taper for now 4. Continue Celcept 5. Unclear wether this represents CAP vs worsening NSIP in this patient 6. Obtain CT chest 7. Obtain formal pulm consult in AM regarding further care 8. Will hold off on ordering further ABx for the moment (next dose of rocephin / azithromycin wont be due until 2230 this evening anyhow). 9. BNP ordered and pending 10. 2d echo ordered and pending 11. Continuous pulse ox and tele monitor  DVT prophylaxis: Lovenox Code Status: Full Family Communication: Family at bedside Disposition Plan: Home after admit Consults called: None, call pulm in AM Admission status: Admit to inpatient   [place], [place] Hospital[facility] Pager [phone]  If 7AM-7PM, please contact day team taking care of patient [email or web address] Password [email or web address]  02/03/2017, 3:01 AM

## 2017-02-03 NOTE — Progress Notes (Signed)
Pt states she utilizes 10 lpm [place] at home with no humidity. RT discussed with PT adding humidity to 02 here at hospital- states she is fine and does not need. Also, discussed with PT HFNC here at hospital- PT again states she is fine and does not need- is use to high 02 at home. RN aware.

## 2017-02-03 NOTE — ED Notes (Signed)
Attempted to call report to ICU. Patient in isolation room and will call back once she has finished.

## 2017-02-03 NOTE — Progress Notes (Addendum)
PROGRESS NOTE  Felicia [name] JYL:[id number] DOB: November 17, 1970 DOA: 02/02/2017 PCP: System, Pcp Not In  Brief History:  46 year old female with a history of hypertension, diabetes mellitus, and nonspecific interstitial pneumonitis presented with 4-day history of worsening cough and shortness of breath.  The patient follows pulmonology at [facility], and she has been on CellCept 1500 mg every 12 hours since April 2011.  She endorses compliance with all her medications.  She has some subjective chills, but denies any hemoptysis, nausea, vomiting, diarrhea, abdominal pain, dysuria.  She denies any recent sick contacts.  She does not smoke.  The patient has finished 2 recent courses of amoxicillin clavulanate.  Most recently, the patient finished a 10-day course on January 19, 2017.  She felt like she had some sinus congestion at that time.  She did not show any improvement, and subsequently her pulmonologist started her on a prednisone taper which she started taking on January 30, 2017 without much improvement in her symptoms.  She noted that her oxygen saturation was 85% at rest.  As a result, she presented for further evaluation.  At rest, the patient normally uses 6-8 L oxygen and 10 L with activity.  She has had to increase her oxygen to 15 L with activity in the past week.  Assessment/Plan: Acute on chronic respiratory failure with hypoxia -Secondary to exacerbation of her nonspecific interstitial pneumonitis  -presently stable on 12 L HFNC  -suspect underlying viral infection may have been the inciting event -wean oxygen back to baseline as tolerated for saturationgreater than 90% -Personally reviewed chest x-ray--scattered bilateral interstitial opacities  Exacerbation nonspecific interstitial pneumonitis  -Start Solu-Medrol -Start scheduled bronchodilators -Continue azithromycin -Viral respiratory panel -02/03/2017 CT chest--diffuse reticular opacities with peripheral honeycombing  and scattered blebs bilateral--may represent superimposed pneumonitis versus progression of NSIP -Continue CellCept -Add Mucinex  Essential hypertension -Continue home doses of atenolol, HCTZ, verapamil  Diabetes mellitus type 2 -Hemoglobin A1c -NovoLog sliding scale -Holding metformin    Disposition Plan:   Home in 2-3 days  Family Communication:  No Family at bedside  Consultants:  none  Code Status:  FULL  DVT Prophylaxis:  [place] Lovenox  Total time spent 35 minutes.  Greater than 50% spent face to face counseling and coordinating care. 0655 to 0730    Procedures: As Listed in Progress Note Above  Antibiotics: Ceftriaxone 12/27 azithro 12/27>>>    Subjective: Patient is breathing a little bit better than yesterday.  She has dyspnea with minimal exertion.  She continues to complain of cough with yellow sputum.  She denies any fevers, chills, chest pain, hemoptysis, nausea, vomiting, diarrhea, hematochezia, melena, dysuria, hematuria.  Objective: Vitals:   02/03/17 0300 02/03/17 0316 02/03/17 0530 02/03/17 0600  BP: (!) 144/93 (!) 150/91 (!) 154/108 (!) 167/104  Pulse: 85 87 98 88  Resp: (!) 24 (!) 26 (!) 33 (!) 29  Temp:   98.2 F (36.8 C)   TempSrc:   Oral   SpO2: 97% 100% (!) 88% 98%  Weight:   103.5 kg (228 lb 2.8 oz)   Height:   _0  (1.651 m)    No intake or output data in the 24 hours ending 02/03/17 0723 Weight change:  Exam:   General:  Pt is alert, follows commands appropriately, not in acute distress  HEENT: No icterus, No thrush, No neck mass, [place]/AT  Cardiovascular: RRR, S1/S2, no rubs, no gallops  Respiratory: Bibasilar crackles.  Scattered bilateral wheeze.  Abdomen: Soft/+BS, non tender, non distended, no guarding  Extremities: No edema, No lymphangitis, No petechiae, No rashes, no synovitis   Data Reviewed: I have personally reviewed following labs and imaging studies Basic Metabolic Panel: Recent Labs  Lab 02/02/17 2130  NA  139  K 3.8  CL 102  CO2 26  GLUCOSE 135*  BUN 12  CREATININE 0.81  CALCIUM 9.2   Liver Function Tests: No results for input(s): AST, ALT, ALKPHOS, BILITOT, PROT, ALBUMIN in the last 168 hours. No results for input(s): LIPASE, AMYLASE in the last 168 hours. No results for input(s): AMMONIA in the last 168 hours. Coagulation Profile: No results for input(s): INR, PROTIME in the last 168 hours. CBC: Recent Labs  Lab 02/02/17 2130  WBC 7.9  HGB 11.6*  HCT 38.1  MCV 82.3  PLT 334   Cardiac Enzymes: No results for input(s): CKTOTAL, CKMB, CKMBINDEX, TROPONINI in the last 168 hours. BNP: Invalid input(s): POCBNP CBG: No results for input(s): GLUCAP in the last 168 hours. HbA1C: No results for input(s): HGBA1C in the last 72 hours. Urine analysis:    Component Value Date/Time   COLORURINE YELLOW 12/24/2008 1604   APPEARANCEUR CLOUDY (A) 12/24/2008 1604   LABSPEC 1.019 12/24/2008 1604   PHURINE 7.5 12/24/2008 1604   GLUCOSEU NEGATIVE 12/24/2008 1604   HGBUR NEGATIVE 12/24/2008 1604   BILIRUBINUR NEGATIVE 12/24/2008 1604   KETONESUR NEGATIVE 12/24/2008 1604   PROTEINUR NEGATIVE 12/24/2008 1604   UROBILINOGEN 0.2 12/24/2008 1604   NITRITE NEGATIVE 12/24/2008 1604   LEUKOCYTESUR MODERATE (A) 12/24/2008 1604   Sepsis Labs: _0 (procalcitonin:4,lacticidven:4) )No results found for this or any previous visit (from the past 240 hour(s)).   Scheduled Meds: . atenolol  75 mg Oral BID  . azithromycin  500 mg Oral Q24H  . enoxaparin (LOVENOX) injection  40 mg Subcutaneous Q24H  . guaiFENesin  600 mg Oral BID  . hydrochlorothiazide  25 mg Oral QAC breakfast  . ipratropium-albuterol  3 mL Nebulization Q6H  . methylPREDNISolone (SOLU-MEDROL) injection  40 mg Intravenous Q6H  . mycophenolate  1,500 mg Oral Q12H  . verapamil  120 mg Oral Daily   Continuous Infusions:  Procedures/Studies: Dg Chest 2 View  Result Date: 02/02/2017 CLINICAL DATA:  Shortness of Breath  EXAM: CHEST  2 VIEW COMPARISON:  October 01, 2012 FINDINGS: There is cardiomegaly with pulmonary venous hypertension. There is widespread interstitial thickening with patchy areas of consolidation in the bases. No evident adenopathy. No bone lesions. IMPRESSION: Diffuse, chronic interstitial thickening, consistent with chronic interstitial lung disease. Patchy opacity in the bases may represent chronic atelectasis or potential superimposed pneumonia. There is cardiomegaly with mild pulmonary venous hypertension. There is felt to be a degree of underlying pulmonary vascular congestion. Electronically Signed   By: Lowella Grip III M.D.   On: 02/02/2017 22:00   Ct Chest Wo Contrast  Result Date: 02/03/2017 CLINICAL DATA:  Acute onset of shortness of breath. Current history of nonspecific interstitial pneumonitis. EXAM: CT CHEST WITHOUT CONTRAST TECHNIQUE: Multidetector CT imaging of the chest was performed following the standard protocol without IV contrast. COMPARISON:  CT of the chest performed 08/29/2008, and chest radiograph performed 02/02/2017 FINDINGS: Cardiovascular: The heart is borderline enlarged. The thoracic aorta is unremarkable. The great vessels are within normal limits. Mediastinum/Nodes: Enlarged periaortic nodes measure up to 1.7 cm in short axis. Prominent hilar nodes are suspected, though not well characterized without contrast. No pericardial effusion is identified. The visualized portions of the thyroid gland are unremarkable. No  axillary lymphadenopathy is seen. Lungs/Pleura: Diffuse reticular opacities, peripheral honeycombing and scattered blebs are noted bilaterally, with subpleural sparing, compatible with the patient's known history of nonspecific interstitial pneumonitis. This has markedly worsened from 2010. Superimposed acute pneumonitis cannot be excluded, though this could all reflect progression of the patient's NSIP. There is no evidence of lobar pneumonia. No pleural  effusion or pneumothorax is seen. Upper Abdomen: The visualized portions of the liver and the spleen are unremarkable in appearance. Musculoskeletal: No acute osseous abnormalities are identified. The visualized musculature is unremarkable in appearance. IMPRESSION: 1. Markedly worsened diffuse reticular opacities, with peripheral honeycombing and scattered blebs. This could all reflect progression of the patient's nonspecific interstitial pneumonitis, though superimposed acute pneumonitis cannot be excluded, whether infectious or inflammatory in nature. No evidence of lobar pneumonia. Would compare with more recent available imaging from an outside facility, if/when available. 2. Underlying enlarged mediastinal nodes seen, measuring up to 1.7 cm in short axis. 3. Borderline cardiomegaly. Electronically Signed   By: Garald Balding M.D.   On: 02/03/2017 04:13    Orson Eva, DO  [facility] Pager [phone]  If 7PM-7AM, please contact night-coverage [email or web address] Password [email or web address] 02/03/2017, 7:23 AM   LOS: 0 days

## 2017-02-03 NOTE — Progress Notes (Signed)
  Echocardiogram 2D Echocardiogram has been performed.  Felicia [name] 02/03/2017, 9:39 AM

## 2017-02-03 NOTE — ED Notes (Signed)
Patient transported to CT 

## 2017-02-04 ENCOUNTER — Encounter (HOSPITAL_COMMUNITY): Payer: Self-pay | Admitting: *Deleted

## 2017-02-04 DIAGNOSIS — I272 Pulmonary hypertension, unspecified: Secondary | ICD-10-CM

## 2017-02-04 DIAGNOSIS — I5031 Acute diastolic (congestive) heart failure: Secondary | ICD-10-CM

## 2017-02-04 LAB — BASIC METABOLIC PANEL
Anion gap: 10 (ref 5–15)
BUN: 11 mg/dL (ref 6–20)
CHLORIDE: 100 mmol/L — AB (ref 101–111)
CO2: 26 mmol/L (ref 22–32)
Calcium: 9.3 mg/dL (ref 8.9–10.3)
Creatinine, Ser: 0.79 mg/dL (ref 0.44–1.00)
GFR calc Af Amer: >60 mL/min (ref >60)
GFR calc non Af Amer: >60 mL/min (ref >60)
GLUCOSE: 170 mg/dL — AB (ref 65–99)
POTASSIUM: 4.5 mmol/L (ref 3.5–5.1)
Sodium: 136 mmol/L (ref 135–145)

## 2017-02-04 LAB — CBC
HEMATOCRIT: 37.4 % (ref 36.0–46.0)
Hemoglobin: 11.3 g/dL — ABNORMAL LOW (ref 12.0–15.0)
MCH: 24.7 pg — AB (ref 26.0–34.0)
MCHC: 30.2 g/dL (ref 30.0–36.0)
MCV: 81.8 fL (ref 78.0–100.0)
Platelets: 300 10*3/uL (ref 150–400)
RBC: 4.57 MIL/uL (ref 3.87–5.11)
RDW: 17.5 % — AB (ref 11.5–15.5)
WBC: 6.6 10*3/uL (ref 4.0–10.5)

## 2017-02-04 LAB — GLUCOSE, CAPILLARY
Glucose-Capillary: 153 mg/dL — ABNORMAL HIGH (ref 65–99)
Glucose-Capillary: 218 mg/dL — ABNORMAL HIGH (ref 65–99)
Glucose-Capillary: 165 mg/dL — ABNORMAL HIGH (ref 65–99)

## 2017-02-04 MED ORDER — FUROSEMIDE 10 MG/ML IJ SOLN
60.0000 mg | Freq: Once | INTRAMUSCULAR | Status: AC
  Administered 2017-02-04: 60 mg via INTRAVENOUS
  Filled 2017-02-04: qty 6

## 2017-02-04 NOTE — Progress Notes (Signed)
PROGRESS NOTE    Felicia [name]  NGE:[id number] DOB: 1970/08/18 DOA: 02/02/2017 PCP: System, Pcp Not In      Brief Narrative:  Pleasant 46 yo F with NSIP chronically on 6-8L at home, HTN, and DM who presents with several days worsening cough, dyspnea, and orthopnea.   Assessment & Plan:  Principal Problem:   Acute on chronic respiratory failure with hypoxia (HCC) Active Problems:   Interstitial pneumonitis (HCC)   NSIP (nonspecific interstitial pneumonitis) (HCC)   Essential hypertension   Dyspnea on exertion   On home oxygen therapy   Acute on chronic respiratory failure with hypoxia Nonspecific interstitial pneumonitis Slightly improved today.  Viral panel negative.   -Continue Solu-Medrol -Continue bronchodilators -Continue azithromycin - Continue CellCept -Continue Mucinex   Acute diastolic CHF Pulmonary hypertension Right heart failure BNP elevated, echocardiogram showed pulmonary hypertension, reduced right heart function, and evidence of fluid overload.  No obvious right heart failure symptoms. -Trial Lasix -Strict I's and O's -Monitor renal function and electrolytes  Hypertension -Continue atenolol, HCTZ, verapamil  Diabetes Well controlled by hemoglobin A1c. -Hold home metformin -SSI    DVT prophylaxis: Lovenox Code Status: Full code Family Communication: None present Disposition Plan: Transfer to telemetry today.  Monitor output from furosemide, renal function, any breathing improvement.  She feels somewhat better with Solu-Medrol, likely will wean down to home oxygen within the next 1-2 days, anticipate discharge Sunday or Monday.   Consultants:   None  Procedures:   Echocardiogram  Antimicrobials:   Azithromycin   Subjective: Feeling somewhat better with steroids.  Still has orthopnea.  No leg swelling.  Objective: Vitals:   02/04/17 0907 02/04/17 1000 02/04/17 1200 02/04/17 1207  BP:  (!) 158/98 (!) 136/98   Pulse:  94 92    Resp:  (!) 25 (!) 25   Temp:    (!) 97.3 F (36.3 C)  TempSrc:    Oral  SpO2: 100% 94% 95%   Weight:      Height:        Intake/Output Summary (Last 24 hours) at 02/04/2017 1322 Last data filed at 02/04/2017 1252 Gross per 24 hour  Intake 240 ml  Output 950 ml  Net -710 ml   Filed Weights   02/02/17 2104 02/02/17 2129 02/03/17 0530  Weight: 102.5 kg (226 lb) 102.5 kg (226 lb) 103.5 kg (228 lb 2.8 oz)    Examination: General appearance: adult female, alert and in MILD RESPIRATORY distress.   HEENT: Anicteric, conjunctiva pink, lids and lashes normal. No nasal deformity, discharge, epistaxis.  Lips moist.   Skin: Warm and dry.   No suspicious rashes or lesions. Cardiac: RRR, nl S1-S2, no murmurs appreciated.  Capillary refill is brisk.  JVP not visible.  Minimal pitting LE edema.  Radial pulses 2+ and symmetric. Respiratory: Tachypneic, fine crackles bilatearlly, no wheezes. Abdomen: Abdomen soft.  No TTP. No ascites, distension, hepatosplenomegaly.   MSK: No deformities or effusions. Neuro: Awake and alert.  EOMI, moves all extremities. Speech fluent.    Psych: Sensorium intact and responding to questions, attention normal. Affect normal.  Judgment and insight appear normal.    Data Reviewed: I have personally reviewed following labs and imaging studies:  CBC: Recent Labs  Lab 02/02/17 2130 02/04/17 0326  WBC 7.9 6.6  HGB 11.6* 11.3*  HCT 38.1 37.4  MCV 82.3 81.8  PLT 334 401   Basic Metabolic Panel: Recent Labs  Lab 02/02/17 2130 02/04/17 0326  NA 139 136  K 3.8 4.5  CL  102 100*  CO2 26 26  GLUCOSE 135* 170*  BUN 12 11  CREATININE 0.81 0.79  CALCIUM 9.2 9.3   GFR: Estimated Creatinine Clearance: 103.8 mL/min (by C-G formula based on SCr of 0.79 mg/dL). Liver Function Tests: No results for input(s): AST, ALT, ALKPHOS, BILITOT, PROT, ALBUMIN in the last 168 hours. No results for input(s): LIPASE, AMYLASE in the last 168 hours. No results for  input(s): AMMONIA in the last 168 hours. Coagulation Profile: No results for input(s): INR, PROTIME in the last 168 hours. Cardiac Enzymes: No results for input(s): CKTOTAL, CKMB, CKMBINDEX, TROPONINI in the last 168 hours. BNP (last 3 results) No results for input(s): PROBNP in the last 8760 hours. HbA1C: Recent Labs    02/03/17 0755  HGBA1C 6.3*   CBG: Recent Labs  Lab 02/03/17 1153 02/03/17 1626 02/03/17 2148 02/04/17 0807 02/04/17 1157  GLUCAP 154* 235* 174* 153* 218*   Lipid Profile: No results for input(s): CHOL, HDL, LDLCALC, TRIG, CHOLHDL, LDLDIRECT in the last 72 hours. Thyroid Function Tests: No results for input(s): TSH, T4TOTAL, FREET4, T3FREE, THYROIDAB in the last 72 hours. Anemia Panel: No results for input(s): VITAMINB12, FOLATE, FERRITIN, TIBC, IRON, RETICCTPCT in the last 72 hours. Urine analysis:    Component Value Date/Time   COLORURINE YELLOW 12/24/2008 1604   APPEARANCEUR CLOUDY (A) 12/24/2008 1604   LABSPEC 1.019 12/24/2008 1604   PHURINE 7.5 12/24/2008 1604   GLUCOSEU NEGATIVE 12/24/2008 1604   HGBUR NEGATIVE 12/24/2008 1604   BILIRUBINUR NEGATIVE 12/24/2008 1604   KETONESUR NEGATIVE 12/24/2008 1604   PROTEINUR NEGATIVE 12/24/2008 1604   UROBILINOGEN 0.2 12/24/2008 1604   NITRITE NEGATIVE 12/24/2008 1604   LEUKOCYTESUR MODERATE (A) 12/24/2008 1604   Sepsis Labs: _0 (procalcitonin:4,lacticacidven:4)  ) Recent Results (from the past 240 hour(s))  MRSA PCR Screening     Status: None   Collection Time: 02/03/17  5:27 AM  Result Value Ref Range Status   MRSA by PCR NEGATIVE NEGATIVE Final    Comment:        The GeneXpert MRSA Assay (FDA approved for NASAL specimens only), is one component of a comprehensive MRSA colonization surveillance program. It is not intended to diagnose MRSA infection nor to guide or monitor treatment for MRSA infections.   Respiratory Panel by PCR     Status: None   Collection Time: 02/03/17  9:53 AM    Result Value Ref Range Status   Adenovirus NOT DETECTED NOT DETECTED Final   Coronavirus 229E NOT DETECTED NOT DETECTED Final   Coronavirus HKU1 NOT DETECTED NOT DETECTED Final   Coronavirus NL63 NOT DETECTED NOT DETECTED Final   Coronavirus OC43 NOT DETECTED NOT DETECTED Final   Metapneumovirus NOT DETECTED NOT DETECTED Final   Rhinovirus / Enterovirus NOT DETECTED NOT DETECTED Final   Influenza A NOT DETECTED NOT DETECTED Final   Influenza B NOT DETECTED NOT DETECTED Final   Parainfluenza Virus 1 NOT DETECTED NOT DETECTED Final   Parainfluenza Virus 2 NOT DETECTED NOT DETECTED Final   Parainfluenza Virus 3 NOT DETECTED NOT DETECTED Final   Parainfluenza Virus 4 NOT DETECTED NOT DETECTED Final   Respiratory Syncytial Virus NOT DETECTED NOT DETECTED Final   Bordetella pertussis NOT DETECTED NOT DETECTED Final   Chlamydophila pneumoniae NOT DETECTED NOT DETECTED Final   Mycoplasma pneumoniae NOT DETECTED NOT DETECTED Final    Comment: Performed at [facility], [place] [address], [place], [place] [address]  Culture, expectorated sputum-assessment     Status: None   Collection  Time: 02/03/17  3:11 PM  Result Value Ref Range Status   Specimen Description EXPECTORATED SPUTUM  Final   Special Requests NONE  Final   Sputum evaluation   Final    Sputum specimen not acceptable for testing.  Please recollect.   RESULT CALLED TO, READ BACK BY AND VERIFIED WITHMike Craze [id number] @ 0321 BY J SCOTTON    Report Status 02/03/2017 FINAL  Final         Radiology Studies: Dg Chest 2 View  Result Date: 02/02/2017 CLINICAL DATA:  Shortness of Breath EXAM: CHEST  2 VIEW COMPARISON:  October 01, 2012 FINDINGS: There is cardiomegaly with pulmonary venous hypertension. There is widespread interstitial thickening with patchy areas of consolidation in the bases. No evident adenopathy. No bone lesions. IMPRESSION: Diffuse, chronic interstitial thickening, consistent with chronic interstitial lung  disease. Patchy opacity in the bases may represent chronic atelectasis or potential superimposed pneumonia. There is cardiomegaly with mild pulmonary venous hypertension. There is felt to be a degree of underlying pulmonary vascular congestion. Electronically Signed   By: Lowella Grip III M.D.   On: 02/02/2017 22:00   Ct Chest Wo Contrast  Result Date: 02/03/2017 CLINICAL DATA:  Acute onset of shortness of breath. Current history of nonspecific interstitial pneumonitis. EXAM: CT CHEST WITHOUT CONTRAST TECHNIQUE: Multidetector CT imaging of the chest was performed following the standard protocol without IV contrast. COMPARISON:  CT of the chest performed 08/29/2008, and chest radiograph performed 02/02/2017 FINDINGS: Cardiovascular: The heart is borderline enlarged. The thoracic aorta is unremarkable. The great vessels are within normal limits. Mediastinum/Nodes: Enlarged periaortic nodes measure up to 1.7 cm in short axis. Prominent hilar nodes are suspected, though not well characterized without contrast. No pericardial effusion is identified. The visualized portions of the thyroid gland are unremarkable. No axillary lymphadenopathy is seen. Lungs/Pleura: Diffuse reticular opacities, peripheral honeycombing and scattered blebs are noted bilaterally, with subpleural sparing, compatible with the patient's known history of nonspecific interstitial pneumonitis. This has markedly worsened from 2010. Superimposed acute pneumonitis cannot be excluded, though this could all reflect progression of the patient's NSIP. There is no evidence of lobar pneumonia. No pleural effusion or pneumothorax is seen. Upper Abdomen: The visualized portions of the liver and the spleen are unremarkable in appearance. Musculoskeletal: No acute osseous abnormalities are identified. The visualized musculature is unremarkable in appearance. IMPRESSION: 1. Markedly worsened diffuse reticular opacities, with peripheral honeycombing and  scattered blebs. This could all reflect progression of the patient's nonspecific interstitial pneumonitis, though superimposed acute pneumonitis cannot be excluded, whether infectious or inflammatory in nature. No evidence of lobar pneumonia. Would compare with more recent available imaging from an outside facility, if/when available. 2. Underlying enlarged mediastinal nodes seen, measuring up to 1.7 cm in short axis. 3. Borderline cardiomegaly. Electronically Signed   By: Garald Balding M.D.   On: 02/03/2017 04:13        Scheduled Meds: . atenolol  75 mg Oral BID  . azithromycin  500 mg Oral Q24H  . enoxaparin (LOVENOX) injection  40 mg Subcutaneous Q24H  . guaiFENesin  600 mg Oral BID  . hydrochlorothiazide  25 mg Oral QAC breakfast  . insulin aspart  0-9 Units Subcutaneous TID WC  . ipratropium-albuterol  3 mL Nebulization TID  . mouth rinse  15 mL Mouth Rinse BID  . methylPREDNISolone (SOLU-MEDROL) injection  40 mg Intravenous Q6H  . mycophenolate  1,500 mg Oral Q12H  . verapamil  120 mg Oral Daily   Continuous Infusions:  LOS: 1 day    Time spent: 30 minutes    Edwin Dada, MD [facility] 02/04/2017, 1:22 PM     Pager [phone] --- please page though AMION:  [email or web address] Password [email or web address] If 7PM-7AM, please contact night-coverage

## 2017-02-04 NOTE — Plan of Care (Signed)
  Nutrition: Adequate nutrition will be maintained 02/04/2017 1304 - Progressing by Dorene Sorrow, RN   Elimination: Will not experience complications related to bowel motility 02/04/2017 1304 - Progressing by Dorene Sorrow, RN   Safety: Ability to remain free from injury will improve 02/04/2017 1304 - Progressing by Dorene Sorrow, RN

## 2017-02-05 LAB — GLUCOSE, CAPILLARY
GLUCOSE-CAPILLARY: 173 mg/dL — AB (ref 65–99)
GLUCOSE-CAPILLARY: 169 mg/dL — AB (ref 65–99)
Glucose-Capillary: 217 mg/dL — ABNORMAL HIGH (ref 65–99)

## 2017-02-05 LAB — BASIC METABOLIC PANEL
ANION GAP: 11 (ref 5–15)
BUN: 22 mg/dL — ABNORMAL HIGH (ref 6–20)
CALCIUM: 9.3 mg/dL (ref 8.9–10.3)
CO2: 28 mmol/L (ref 22–32)
Chloride: 99 mmol/L — ABNORMAL LOW (ref 101–111)
Creatinine, Ser: 0.88 mg/dL (ref 0.44–1.00)
GFR calc Af Amer: >60 mL/min (ref >60)
GFR calc non Af Amer: >60 mL/min (ref >60)
GLUCOSE: 162 mg/dL — AB (ref 65–99)
Potassium: 3.1 mmol/L — ABNORMAL LOW (ref 3.5–5.1)
Sodium: 138 mmol/L (ref 135–145)

## 2017-02-05 LAB — CBC
HCT: 36.7 % (ref 36.0–46.0)
Hemoglobin: 11.1 g/dL — ABNORMAL LOW (ref 12.0–15.0)
MCH: 24.7 pg — ABNORMAL LOW (ref 26.0–34.0)
MCHC: 30.2 g/dL (ref 30.0–36.0)
MCV: 81.7 fL (ref 78.0–100.0)
Platelets: 303 K/uL (ref 150–400)
RBC: 4.49 MIL/uL (ref 3.87–5.11)
RDW: 17.5 % — ABNORMAL HIGH (ref 11.5–15.5)
WBC: 11.6 K/uL — ABNORMAL HIGH (ref 4.0–10.5)

## 2017-02-05 MED ORDER — POTASSIUM CHLORIDE 20 MEQ/15ML (10%) PO SOLN: 40.0000 meq | Freq: Once | ORAL | Status: DC

## 2017-02-05 MED ORDER — POTASSIUM CHLORIDE ER 10 MEQ PO TBCR
20.0000 meq | EXTENDED_RELEASE_TABLET | Freq: Two times a day (BID) | ORAL | Status: DC
  Administered 2017-02-05 – 2017-02-06 (×3): 20 meq via ORAL
  Filled 2017-02-05 (×6): qty 2

## 2017-02-05 MED ORDER — FUROSEMIDE 10 MG/ML IJ SOLN
20.0000 mg | Freq: Once | INTRAMUSCULAR | Status: AC
Start: 2017-02-05 — End: 2017-02-05
  Administered 2017-02-05: 20 mg via INTRAVENOUS
  Filled 2017-02-05: qty 2

## 2017-02-05 NOTE — Progress Notes (Signed)
PROGRESS NOTE    Felicia [name]  HKV:[id number] DOB: 1970/11/16 DOA: 02/02/2017 PCP: System, Pcp Not In      Brief Narrative:  Pleasant 46 yo F with NSIP chronically on 6-8L at home, HTN, and DM who presents with several days worsening cough, dyspnea, and orthopnea.   Assessment & Plan:  Principal Problem:   Acute on chronic respiratory failure with hypoxia (HCC) Active Problems:   Interstitial pneumonitis (HCC)   NSIP (nonspecific interstitial pneumonitis) (HCC)   Essential hypertension   Dyspnea on exertion   On home oxygen therapy   Acute on chronic respiratory failure with hypoxia Nonspecific interstitial pneumonitis Improved again today.  Viral panel negative.   -Continue Solu-Medrol, plan for 7 days prednisone taper at d/c -Continue bronchodilators -Continue azithromycin, plan to finish 5d on the 31s in the evening -Continue Mucinex -Continue home CellCept -Has follow up with her Pulm at [facility] on Jan 11 already   Acute diastolic CHF Pulmonary hypertension Right heart failure BNP elevated, echocardiogram showed pulmonary hypertension, reduced right heart function, and evidence of fluid overload.  No obvious right heart failure symptoms.  Improved breathing and orthopnea resolved.  Not on home daily Lasix ever before. -Lasix IV x1 more, not currently planning to discharge on oral Lasix -Strict I's and O's -Monitor renal function and electrolytes  Hypertension -Continue atenolol, HCTZ, verapamil  Diabetes Well controlled by hemoglobin A1c. -Hold home metformin -SSI    DVT prophylaxis: Lovenox Code Status: Full code Family Communication: None present Disposition Plan: IV Lasix one more day, then home tomorrow with steroid taper.   Consultants:   None  Procedures:   Echocardiogram  Antimicrobials:   Azithromycin 12/27 >>    Subjective: Feeling better.  Orthopnea resolved.  No chest pain.  No leg swelling.  No new fever, confusion,  weakness.  Objective: Vitals:   02/05/17 0526 02/05/17 0736 02/05/17 1343 02/05/17 1409  BP: 130/73  136/90   Pulse: 66  66   Resp: (!) 24  16   Temp: 97.7 F (36.5 C)  98 F (36.7 C)   TempSrc: Oral  Oral   SpO2: 98% 99% 100% 99%  Weight: 106.5 kg (234 lb 12.6 oz)     Height:        Intake/Output Summary (Last 24 hours) at 02/05/2017 1628 Last data filed at 02/05/2017 1530 Gross per 24 hour  Intake -  Output 1925 ml  Net -1925 ml   Filed Weights   02/03/17 0530 02/04/17 1408 02/05/17 0526  Weight: 103.5 kg (228 lb 2.8 oz) 103.5 kg (228 lb 2.8 oz) 106.5 kg (234 lb 12.6 oz)    Examination: General appearance: adult female, alert and interactive   HEENT: Anicteric, conjunctiva pink, lids and lashes normal. No nasal deformity, discharge, epistaxis.  Lips moist.   Skin: Warm and dry.   No suspicious rashes or lesions. Cardiac: RRR, nl S1-S2, no murmurs appreciated.  Capillary refill is brisk.  JVP not visible.  No LE edema.  Radial pulses 2+ and symmetric. Respiratory: Tachypneic with exertion, Diminished at bases, no wheezes. Abdomen: Abdomen soft.  No TTP. No ascites, distension, hepatosplenomegaly.   MSK: No deformities or effusions. Neuro: Awake and alert.  EOMI, moves all extremities. Speech fluent.    Psych: Sensorium intact and responding to questions, attention normal. Affect normal.  Judgment and insight appear normal.    Data Reviewed: I have personally reviewed following labs and imaging studies:  CBC: Recent Labs  Lab 02/02/17 2130 02/04/17 0326 02/05/17 5643  WBC 7.9 6.6 11.6*  HGB 11.6* 11.3* 11.1*  HCT 38.1 37.4 36.7  MCV 82.3 81.8 81.7  PLT 334 300 383   Basic Metabolic Panel: Recent Labs  Lab 02/02/17 2130 02/04/17 0326 02/05/17 0513  NA 139 136 138  K 3.8 4.5 3.1*  CL 102 100* 99*  CO2 _0 GLUCOSE 135* 170* 162*  BUN 12 11 22*  CREATININE 0.81 0.79 0.88  CALCIUM 9.2 9.3 9.3   GFR: Estimated Creatinine Clearance: 95.8 mL/min  (by C-G formula based on SCr of 0.88 mg/dL). Liver Function Tests: No results for input(s): AST, ALT, ALKPHOS, BILITOT, PROT, ALBUMIN in the last 168 hours. No results for input(s): LIPASE, AMYLASE in the last 168 hours. No results for input(s): AMMONIA in the last 168 hours. Coagulation Profile: No results for input(s): INR, PROTIME in the last 168 hours. Cardiac Enzymes: No results for input(s): CKTOTAL, CKMB, CKMBINDEX, TROPONINI in the last 168 hours. BNP (last 3 results) No results for input(s): PROBNP in the last 8760 hours. HbA1C: Recent Labs    02/03/17 0755  HGBA1C 6.3*   CBG: Recent Labs  Lab 02/04/17 0807 02/04/17 1157 02/04/17 1705 02/05/17 0810 02/05/17 1148  GLUCAP 153* 218* 165* 173* 217*   Lipid Profile: No results for input(s): CHOL, HDL, LDLCALC, TRIG, CHOLHDL, LDLDIRECT in the last 72 hours. Thyroid Function Tests: No results for input(s): TSH, T4TOTAL, FREET4, T3FREE, THYROIDAB in the last 72 hours. Anemia Panel: No results for input(s): VITAMINB12, FOLATE, FERRITIN, TIBC, IRON, RETICCTPCT in the last 72 hours. Urine analysis:    Component Value Date/Time   COLORURINE YELLOW 12/24/2008 1604   APPEARANCEUR CLOUDY (A) 12/24/2008 1604   LABSPEC 1.019 12/24/2008 1604   PHURINE 7.5 12/24/2008 1604   GLUCOSEU NEGATIVE 12/24/2008 1604   HGBUR NEGATIVE 12/24/2008 1604   BILIRUBINUR NEGATIVE 12/24/2008 1604   KETONESUR NEGATIVE 12/24/2008 1604   PROTEINUR NEGATIVE 12/24/2008 1604   UROBILINOGEN 0.2 12/24/2008 1604   NITRITE NEGATIVE 12/24/2008 1604   LEUKOCYTESUR MODERATE (A) 12/24/2008 1604   Sepsis Labs: _1 (procalcitonin:4,lacticacidven:4)  ) Recent Results (from the past 240 hour(s))  MRSA PCR Screening     Status: None   Collection Time: 02/03/17  5:27 AM  Result Value Ref Range Status   MRSA by PCR NEGATIVE NEGATIVE Final    Comment:        The GeneXpert MRSA Assay (FDA approved for NASAL specimens only), is one component of  a comprehensive MRSA colonization surveillance program. It is not intended to diagnose MRSA infection nor to guide or monitor treatment for MRSA infections.   Respiratory Panel by PCR     Status: None   Collection Time: 02/03/17  9:53 AM  Result Value Ref Range Status   Adenovirus NOT DETECTED NOT DETECTED Final   Coronavirus 229E NOT DETECTED NOT DETECTED Final   Coronavirus HKU1 NOT DETECTED NOT DETECTED Final   Coronavirus NL63 NOT DETECTED NOT DETECTED Final   Coronavirus OC43 NOT DETECTED NOT DETECTED Final   Metapneumovirus NOT DETECTED NOT DETECTED Final   Rhinovirus / Enterovirus NOT DETECTED NOT DETECTED Final   Influenza A NOT DETECTED NOT DETECTED Final   Influenza B NOT DETECTED NOT DETECTED Final   Parainfluenza Virus 1 NOT DETECTED NOT DETECTED Final   Parainfluenza Virus 2 NOT DETECTED NOT DETECTED Final   Parainfluenza Virus 3 NOT DETECTED NOT DETECTED Final   Parainfluenza Virus 4 NOT DETECTED NOT DETECTED Final   Respiratory Syncytial Virus NOT DETECTED NOT DETECTED Final  Bordetella pertussis NOT DETECTED NOT DETECTED Final   Chlamydophila pneumoniae NOT DETECTED NOT DETECTED Final   Mycoplasma pneumoniae NOT DETECTED NOT DETECTED Final    Comment: Performed at [facility], [place] [address], [place], [place] [address]  Culture, expectorated sputum-assessment     Status: None   Collection Time: 02/03/17  3:11 PM  Result Value Ref Range Status   Specimen Description EXPECTORATED SPUTUM  Final   Special Requests NONE  Final   Sputum evaluation   Final    Sputum specimen not acceptable for testing.  Please recollect.   RESULT CALLED TO, READ BACK BY AND VERIFIED WITH: Mike Craze [id number] @ 1736 BY J SCOTTON    Report Status 02/03/2017 FINAL  Final         Radiology Studies: No results found.      Scheduled Meds: . atenolol  75 mg Oral BID  . azithromycin  500 mg Oral Q24H  . enoxaparin (LOVENOX) injection  40 mg Subcutaneous Q24H  .  guaiFENesin  600 mg Oral BID  . hydrochlorothiazide  25 mg Oral QAC breakfast  . insulin aspart  0-9 Units Subcutaneous TID WC  . ipratropium-albuterol  3 mL Nebulization TID  . mouth rinse  15 mL Mouth Rinse BID  . methylPREDNISolone (SOLU-MEDROL) injection  40 mg Intravenous Q6H  . mycophenolate  1,500 mg Oral Q12H  . potassium chloride  20 mEq Oral BID  . verapamil  120 mg Oral Daily   Continuous Infusions:   LOS: 2 days    Time spent: 30 minutes    Edwin Dada, MD [facility] 02/05/2017, 4:28 PM     Pager [phone] --- please page though AMION:  [email or web address] Password [email or web address] If 7PM-7AM, please contact night-coverage

## 2017-02-06 LAB — BASIC METABOLIC PANEL
Anion gap: 10 (ref 5–15)
BUN: 25 mg/dL — AB (ref 6–20)
CHLORIDE: 94 mmol/L — AB (ref 101–111)
CO2: 31 mmol/L (ref 22–32)
Calcium: 9 mg/dL (ref 8.9–10.3)
Creatinine, Ser: 0.91 mg/dL (ref 0.44–1.00)
GFR calc Af Amer: >60 mL/min (ref >60)
GFR calc non Af Amer: >60 mL/min (ref >60)
Glucose, Bld: 181 mg/dL — ABNORMAL HIGH (ref 65–99)
POTASSIUM: 3.4 mmol/L — AB (ref 3.5–5.1)
SODIUM: 135 mmol/L (ref 135–145)

## 2017-02-06 LAB — CBC
HEMATOCRIT: 37.7 % (ref 36.0–46.0)
Hemoglobin: 11.3 g/dL — ABNORMAL LOW (ref 12.0–15.0)
MCH: 24.7 pg — ABNORMAL LOW (ref 26.0–34.0)
MCHC: 30 g/dL (ref 30.0–36.0)
MCV: 82.5 fL (ref 78.0–100.0)
Platelets: 295 10*3/uL (ref 150–400)
RBC: 4.57 MIL/uL (ref 3.87–5.11)
RDW: 17.2 % — AB (ref 11.5–15.5)
WBC: 10.8 10*3/uL — AB (ref 4.0–10.5)

## 2017-02-06 LAB — GLUCOSE, CAPILLARY
GLUCOSE-CAPILLARY: 179 mg/dL — AB (ref 65–99)
Glucose-Capillary: 175 mg/dL — ABNORMAL HIGH (ref 65–99)

## 2017-02-06 MED ORDER — PREDNISONE 20 MG PO TABS: 40.0000 mg | ORAL_TABLET | Freq: Two times a day (BID) | ORAL | Status: DC

## 2017-02-06 MED ORDER — PREDNISONE 20 MG PO TABS: ORAL_TABLET | ORAL | 0 refills | Status: DC

## 2017-02-06 MED ORDER — AZITHROMYCIN 500 MG PO TABS
500.0000 mg | ORAL_TABLET | Freq: Every day | ORAL | 0 refills | Status: AC
Start: 2017-02-06 — End: 2017-02-08

## 2017-02-06 MED ORDER — POTASSIUM CHLORIDE ER 20 MEQ PO TBCR: EXTENDED_RELEASE_TABLET | ORAL | 0 refills | Status: DC

## 2017-02-06 MED ORDER — FUROSEMIDE 40 MG PO TABS: ORAL_TABLET | ORAL | 0 refills | Status: DC

## 2017-02-06 NOTE — Discharge Summary (Signed)
[facility]  Physician Discharge Summary   Patient ID: Felicia [name] MRN: [id number] DOB/AGE: Jun 13, 1970 46 y.o.  Admit date: 02/02/2017 Discharge date: 02/06/2017  PCP: System, Pcp Not In  DISCHARGE DIAGNOSES:  Principal Problem:   Acute on chronic respiratory failure with hypoxia ([place]) Active Problems:   Interstitial pneumonitis (HCC)   NSIP (nonspecific interstitial pneumonitis) (HCC)   Essential hypertension   Dyspnea on exertion   On home oxygen therapy   RECOMMENDATIONS FOR OUTPATIENT FOLLOW UP: 1. Patient to follow-up with her pulmonologist at [place]: fair  Diet recommendation: As before  [facility] Weights   02/04/17 1408 02/05/17 0526 02/06/17 0503  Weight: 103.5 kg (228 lb 2.8 oz) 106.5 kg (234 lb 12.6 oz) 106.2 kg (234 lb 2.1 oz)    INITIAL HISTORY: Pleasant 46 yo F with NSIP chronically on 6-8L at home, HTN, and DM who presents with several days worsening cough, dyspnea, and orthopnea.  Consultations:  None  Procedures: Transthoracic echocardiogram Study Conclusions  - Left ventricle: The cavity size was normal. Systolic function was   normal. The estimated ejection fraction was in the range of 55%   to 60%. Wall motion was normal; there were no regional wall   motion abnormalities. Doppler parameters are consistent with   abnormal left ventricular relaxation (grade 1 diastolic   dysfunction). - Ventricular septum: The contour showed diastolic flattening and   systolic flattening. - Mitral valve: There was no regurgitation. - Right ventricle: The cavity size was severely dilated. Wall   thickness was normal. Systolic function was moderately reduced. - Right atrium: The atrium was moderately dilated. - Tricuspid valve: There was moderate regurgitation. - Pulmonic valve: There was trivial regurgitation. - Pulmonary arteries: Systolic pressure was moderately to severely   increased. PA peak pressure: 70 mm Hg (S). -  Inferior vena cava: The vessel was dilated. The respirophasic   diameter changes were blunted (< 50%), consistent with elevated   central venous pressure. - Pericardium, extracardiac: There was no pericardial effusion.    HOSPITAL COURSE:   Acute on chronic respiratory failure with hypoxia Nonspecific interstitial pneumonitis Patient was placed on steroids antibiotics.  Respiratory viral panel was negative.  Patient slowly started improving.  She was also given bronchodilators.  She feels like she is very close to baseline.  She would like to go home today.  She will be discharged on 2 more days of antibiotics.  She will be discharged also on tapering doses of steroids.  She does take prednisone 10 mg daily.  She will follow-up with her pulmonologist at [facility].  She has an appointment on January 11. She may continue her home medications.  She has home oxygen.  Acute diastolic CHF Pulmonary hypertension Right heart failure BNP was noted to be elevated.  Echocardiogram showed pulmonary hypertension, reduced right heart function, and evidence of fluid overload.  No obvious right heart failure symptoms.  Patient was given diuretics with good diuresis and improvement in symptoms.  She will be discharged on Lasix to be taken as needed for fluid gain.  Essential hypertension Continue atenolol, HCTZ, verapamil  Diabetes Mellitus type II Continue home medications.   Overall stable.  Wishes to go home.  Okay for discharge.    PERTINENT LABS:  The results of significant diagnostics from this hospitalization (including imaging, microbiology, ancillary and laboratory) are listed below for reference.    Microbiology: Recent Results (from the past 240 hour(s))  MRSA PCR Screening     Status: None  Collection Time: 02/03/17  5:27 AM  Result Value Ref Range Status   MRSA by PCR NEGATIVE NEGATIVE Final    Comment:        The GeneXpert MRSA Assay (FDA approved for NASAL specimens only),  is one component of a comprehensive MRSA colonization surveillance program. It is not intended to diagnose MRSA infection nor to guide or monitor treatment for MRSA infections.   Respiratory Panel by PCR     Status: None   Collection Time: 02/03/17  9:53 AM  Result Value Ref Range Status   Adenovirus NOT DETECTED NOT DETECTED Final   Coronavirus 229E NOT DETECTED NOT DETECTED Final   Coronavirus HKU1 NOT DETECTED NOT DETECTED Final   Coronavirus NL63 NOT DETECTED NOT DETECTED Final   Coronavirus OC43 NOT DETECTED NOT DETECTED Final   Metapneumovirus NOT DETECTED NOT DETECTED Final   Rhinovirus / Enterovirus NOT DETECTED NOT DETECTED Final   Influenza A NOT DETECTED NOT DETECTED Final   Influenza B NOT DETECTED NOT DETECTED Final   Parainfluenza Virus 1 NOT DETECTED NOT DETECTED Final   Parainfluenza Virus 2 NOT DETECTED NOT DETECTED Final   Parainfluenza Virus 3 NOT DETECTED NOT DETECTED Final   Parainfluenza Virus 4 NOT DETECTED NOT DETECTED Final   Respiratory Syncytial Virus NOT DETECTED NOT DETECTED Final   Bordetella pertussis NOT DETECTED NOT DETECTED Final   Chlamydophila pneumoniae NOT DETECTED NOT DETECTED Final   Mycoplasma pneumoniae NOT DETECTED NOT DETECTED Final    Comment: Performed at [facility], [place] [address], [place], [place] [address]  Culture, expectorated sputum-assessment     Status: None   Collection Time: 02/03/17  3:11 PM  Result Value Ref Range Status   Specimen Description EXPECTORATED SPUTUM  Final   Special Requests NONE  Final   Sputum evaluation   Final    Sputum specimen not acceptable for testing.  Please recollect.   RESULT CALLED TO, READ BACK BY AND VERIFIED WITH: Mike Craze [id number] @ 1736 BY J SCOTTON    Report Status 02/03/2017 FINAL  Final     Labs: Basic Metabolic Panel: Recent Labs  Lab 02/02/17 2130 02/04/17 0326 02/05/17 0513 02/06/17 0531  NA 139 136 138 135  K 3.8 4.5 3.1* 3.4*  CL 102 100* 99* 94*  CO2 _0 GLUCOSE 135* 170* 162* 181*  BUN 12 11 22* 25*  CREATININE 0.81 0.79 0.88 0.91  CALCIUM 9.2 9.3 9.3 9.0   CBC: Recent Labs  Lab 02/02/17 2130 02/04/17 0326 02/05/17 0513 02/06/17 0531  WBC 7.9 6.6 11.6* 10.8*  HGB 11.6* 11.3* 11.1* 11.3*  HCT 38.1 37.4 36.7 37.7  MCV 82.3 81.8 81.7 82.5  PLT 334 300 303 295   BNP: BNP (last 3 results) Recent Labs    02/03/17 0320  BNP 475.8*    CBG: Recent Labs  Lab 02/05/17 0810 02/05/17 1148 02/05/17 1731 02/06/17 0745 02/06/17 1209  GLUCAP 173* 217* 169* 175* 179*     IMAGING STUDIES Dg Chest 2 View  Result Date: 02/02/2017 CLINICAL DATA:  Shortness of Breath EXAM: CHEST  2 VIEW COMPARISON:  October 01, 2012 FINDINGS: There is cardiomegaly with pulmonary venous hypertension. There is widespread interstitial thickening with patchy areas of consolidation in the bases. No evident adenopathy. No bone lesions. IMPRESSION: Diffuse, chronic interstitial thickening, consistent with chronic interstitial lung disease. Patchy opacity in the bases may represent chronic atelectasis or potential superimposed pneumonia. There is cardiomegaly with mild pulmonary venous hypertension.  There is felt to be a degree of underlying pulmonary vascular congestion. Electronically Signed   By: Lowella Grip III M.D.   On: 02/02/2017 22:00   Ct Chest Wo Contrast  Result Date: 02/03/2017 CLINICAL DATA:  Acute onset of shortness of breath. Current history of nonspecific interstitial pneumonitis. EXAM: CT CHEST WITHOUT CONTRAST TECHNIQUE: Multidetector CT imaging of the chest was performed following the standard protocol without IV contrast. COMPARISON:  CT of the chest performed 08/29/2008, and chest radiograph performed 02/02/2017 FINDINGS: Cardiovascular: The heart is borderline enlarged. The thoracic aorta is unremarkable. The great vessels are within normal limits. Mediastinum/Nodes: Enlarged periaortic nodes measure up to 1.7 cm in short axis.  Prominent hilar nodes are suspected, though not well characterized without contrast. No pericardial effusion is identified. The visualized portions of the thyroid gland are unremarkable. No axillary lymphadenopathy is seen. Lungs/Pleura: Diffuse reticular opacities, peripheral honeycombing and scattered blebs are noted bilaterally, with subpleural sparing, compatible with the patient's known history of nonspecific interstitial pneumonitis. This has markedly worsened from 2010. Superimposed acute pneumonitis cannot be excluded, though this could all reflect progression of the patient's NSIP. There is no evidence of lobar pneumonia. No pleural effusion or pneumothorax is seen. Upper Abdomen: The visualized portions of the liver and the spleen are unremarkable in appearance. Musculoskeletal: No acute osseous abnormalities are identified. The visualized musculature is unremarkable in appearance. IMPRESSION: 1. Markedly worsened diffuse reticular opacities, with peripheral honeycombing and scattered blebs. This could all reflect progression of the patient's nonspecific interstitial pneumonitis, though superimposed acute pneumonitis cannot be excluded, whether infectious or inflammatory in nature. No evidence of lobar pneumonia. Would compare with more recent available imaging from an outside facility, if/when available. 2. Underlying enlarged mediastinal nodes seen, measuring up to 1.7 cm in short axis. 3. Borderline cardiomegaly. Electronically Signed   By: Garald Balding M.D.   On: 02/03/2017 04:13    DISCHARGE EXAMINATION: Vitals:   02/05/17 2153 02/06/17 0503 02/06/17 0718 02/06/17 1014  BP: 135/82 122/65  125/60  Pulse: 76 61  64  Resp:  20    Temp: 97.8 F (36.6 C) 97.8 F (36.6 C)    TempSrc: Oral Oral    SpO2: 95% 100% 100%   Weight:  106.2 kg (234 lb 2.1 oz)    Height:       General appearance: alert, cooperative, appears stated age and no distress Resp: Coarse breath sounds with crackles  bilaterally.  Normal effort at rest.  Few scattered wheezes. Cardio: regular rate and rhythm, S1, S2 normal, no murmur, click, rub or gallop GI: soft, non-tender; bowel sounds normal; no masses,  no organomegaly  DISPOSITION: Home with family  Discharge Instructions    (HEART FAILURE PATIENTS) Call MD:  Anytime you have any of the following symptoms: 1) 3 pound weight gain in 24 hours or 5 pounds in 1 week 2) shortness of breath, with or without a dry hacking cough 3) swelling in the hands, feet or stomach 4) if you have to sleep on extra pillows at night in order to breathe.   Complete by:  As directed    Call MD for:  extreme fatigue   Complete by:  As directed    Call MD for:  persistant dizziness or light-headedness   Complete by:  As directed    Call MD for:  persistant nausea and vomiting   Complete by:  As directed    Call MD for:  severe uncontrolled pain   Complete by:  As directed    Call MD for:  temperature >100.4   Complete by:  As directed    Discharge instructions   Complete by:  As directed    Please be sure to follow-up with your primary care provider within 1 week.  Follow-up with your pulmonologist as well.  Take your medications as prescribed.  You were cared for by a hospitalist during your hospital stay. If you have any questions about your discharge medications or the care you received while you were in the hospital after you are discharged, you can call the unit and asked to speak with the hospitalist on call if the hospitalist that took care of you is not available. Once you are discharged, your primary care physician will handle any further medical issues. Please note that NO REFILLS for any discharge medications will be authorized once you are discharged, as it is imperative that you return to your primary care physician (or establish a relationship with a primary care physician if you do not have one) for your aftercare needs so that they can reassess your need for  medications and monitor your lab values. If you do not have a primary care physician, you can call [phone] for a physician referral.   Increase activity slowly   Complete by:  As directed         Allergies as of 02/06/2017   No Known Allergies     Medication List    STOP taking these medications   amoxicillin-clavulanate 875-125 MG tablet Commonly known as:  AUGMENTIN     TAKE these medications   albuterol 108 (90 Base) MCG/ACT inhaler Commonly known as:  PROVENTIL HFA;VENTOLIN HFA Inhale 1 puff into the lungs every 6 (six) hours as needed for wheezing or shortness of breath.   atenolol 50 MG tablet Commonly known as:  TENORMIN Take 75 mg by mouth 2 (two) times daily.   azithromycin 500 MG tablet Commonly known as:  ZITHROMAX Take 1 tablet (500 mg total) by mouth daily for 2 days.   furosemide 40 MG tablet Commonly known as:  LASIX Take one tablet (25m) for weight gain of 2 lbs   hydrochlorothiazide 25 MG tablet Commonly known as:  HYDRODIURIL Take 25 mg by mouth daily.   ipratropium-albuterol 0.5-2.5 (3) MG/3ML Soln Commonly known as:  DUONEB Inhale 3 mLs into the lungs every 6 (six) hours as needed (sob and wheezing).   metFORMIN 500 MG tablet Commonly known as:  GLUCOPHAGE Take 500 mg by mouth 2 (two) times daily with a meal.   MUCINEX 600 MG 12 hr tablet Generic drug:  guaiFENesin Take 600 mg by mouth daily as needed for to loosen phlegm.   mycophenolate 500 MG tablet Commonly known as:  CELLCEPT Take 1,500 mg by mouth 2 (two) times daily. 3 tablets (1500 mg) bid   Potassium Chloride ER 20 MEQ Tbcr Take one tablet along with lasix, when you need to take lasix.   predniSONE 10 MG tablet Commonly known as:  DELTASONE Take 10 mg by mouth daily with breakfast. What changed:  Another medication with the same name was changed. Make sure you understand how and when to take each.   predniSONE 20 MG tablet Commonly known as:  DELTASONE Take 2 tablets  twice daily for 3 days, then take 2 tablets once daily for 3 days, then take 1 tablet once daily for 3 days and then 1 tablet once daily as before What changed:    medication strength  how much to take  how to take this  when to take this  additional instructions   verapamil 120 MG CR tablet Commonly known as:  CALAN-SR Take 1 tablet (120 mg total) by mouth at bedtime. What changed:  when to take this        Follow-up Information    Rankins, Bill Salinas, MD. Schedule an appointment as soon as possible for a visit in 1 week(s).   Specialty:  Family Medicine Contact information: [place] [address] [phone]           TOTAL DISCHARGE TIME: 35 mins  Bonnielee Haff  [facility] Pager [phone]  02/06/2017, 4:24 PM

## 2017-02-08 LAB — GLUCOSE, CAPILLARY
Glucose-Capillary: 190 mg/dL — ABNORMAL HIGH (ref 65–99)
Glucose-Capillary: 206 mg/dL — ABNORMAL HIGH (ref 65–99)

## 2017-03-15 ENCOUNTER — Encounter: Payer: Self-pay | Admitting: Cardiology

## 2017-03-19 NOTE — Progress Notes (Signed)
Cardiology Office Note   Date:  03/21/2017   ID:  Felicia [name], DOB [birth year], MRN [id number]  PCP:  Aretta Nip, MD  Cardiologist:   No primary care provider on file. Referring:  Self  Chief Complaint  Patient presents with  . Shortness of Breath      History of Present Illness: Felicia [name] is a 47 y.o. female who presents for evaluation of HTN.  She was in the hospital late last year for evaluation of respiratory failure.  This was multifactorial.  She did have acute diastolic HF.    EF was NL.   However, there was also significant pulmonary HTN.    She was treated for a non specific pneumonitis and was to follow up with pulmonary medicine at [facility].  I reviewed these records for this visit.     She has a long history of lung disease treated at [facility].  She responded to treatment in the hospital with steroids diuresis.  She feels much better than she did when she presented in December.  She is back to her baseline which includes chronic O2 use.  She is not having any swelling new PND or orthopnea.  She sleeps on a wedge.  She denies any chest pressure, neck or arm discomfort.  She is had no palpitations, presyncope or syncope.  Of note she did have paroxysmal supraventricular tachycardia in 2016 and I reviewed these records.  She is been on verapamil since then and has had no repeat of this.   Past Medical History:  Diagnosis Date  . Diabetes mellitus without complication ([place])    type 2   . Hypertension   . NSIP (nonspecific interstitial pneumonitis) ([place])   . SVT (supraventricular tachycardia) ([facility])     Past Surgical History:  Procedure Laterality Date  . CESAREAN SECTION    . LUNG BIOPSY       Current Outpatient Medications  Medication Sig Dispense Refill  . albuterol (PROVENTIL HFA;VENTOLIN HFA) 108 (90 Base) MCG/ACT inhaler Inhale 1 puff into the lungs every 6 (six) hours as needed for wheezing or shortness of breath.    [name] atenolol (TENORMIN) 50  MG tablet Take 75 mg by mouth 2 (two) times daily.     . furosemide (LASIX) 40 MG tablet Take one tablet (46m) for weight gain of 2 lbs 30 tablet 0  . guaiFENesin (MUCINEX) 600 MG 12 hr tablet Take 600 mg by mouth daily as needed for to loosen phlegm.    . hydrochlorothiazide (HYDRODIURIL) 25 MG tablet Take 25 mg by mouth daily.    .Marland Kitchenipratropium-albuterol (DUONEB) 0.5-2.5 (3) MG/3ML SOLN Inhale 3 mLs into the lungs every 6 (six) hours as needed (sob and wheezing).     . metFORMIN (GLUCOPHAGE) 500 MG tablet Take 500 mg by mouth 2 (two) times daily with a meal.     . mycophenolate (CELLCEPT) 500 MG tablet Take 1,500 mg by mouth 2 (two) times daily. 3 tablets (1500 mg) bid    . potassium chloride 20 MEQ TBCR Take one tablet along with lasix, when you need to take lasix. 30 tablet 0  . predniSONE (DELTASONE) 10 MG tablet Take 10 mg by mouth daily with breakfast.    . predniSONE (DELTASONE) 20 MG tablet Take 2 tablets twice daily for 3 days, then take 2 tablets once daily for 3 days, then take 1 tablet once daily for 3 days and then 1 tablet once daily as before 30 tablet 0  .  verapamil (CALAN-SR) 120 MG CR tablet Take 1 tablet (120 mg total) by mouth at bedtime. (Patient taking differently: Take 120 mg by mouth every morning. ) 30 tablet 0   No current facility-administered medications for this visit.     Allergies:   Patient has no known allergies.    Social History:  The patient  reports that  has never smoked. she has never used smokeless tobacco. She reports that she does not drink alcohol or use drugs.   Family History:  The patient's family history includes Cancer in her father, maternal grandfather, maternal grandmother, paternal grandfather, and paternal grandmother; Diabetes in her father and sister; Hypertension in her father and mother; Mental retardation in her sister.    ROS:  Please see the history of present illness.   Otherwise, review of systems are positive for none.   All other  systems are reviewed and negative.    PHYSICAL EXAM: VS:  BP 110/80 (BP Location: Left Arm, Patient Position: Sitting, Cuff Size: Large)   Pulse 73   Ht _0  (1.651 m)   Wt 226 lb (102.5 kg)   BMI 37.61 kg/m  , BMI Body mass index is 37.61 kg/m. GENERAL:  Well appearing HEENT:  Pupils equal round and reactive, fundi not visualized, oral mucosa unremarkable NECK:   Jugular venous distention not appreciated, waveform within normal limits, carotid upstroke brisk and symmetric, no bruits, no thyromegaly LYMPHATICS:  No cervical, inguinal adenopathy LUNGS:  Slightly decreased breath sounds BACK:  No CVA tenderness CHEST:  Unremarkable HEART:  PMI not displaced or sustained,S1 WNL, split S2 within normal limits, no S3, no S4, no clicks, no rubs, no murmurs ABD:  Flat, positive bowel sounds normal in frequency in pitch, no bruits, no rebound, no guarding, no midline pulsatile mass, no hepatomegaly, no splenomegaly EXT:  2 plus pulses throughout, no edema, no cyanosis, positive clubbing but without [place] sign.   SKIN:  No rashes no nodules NEURO:  Cranial nerves II through XII grossly intact, motor grossly intact throughout PSYCH:  Cognitively intact, oriented to person place and time    EKG:  EKG is not ordered today. The ekg ordered 02/02/17 demonstrates sinus rhythm, rate 93, axis within normal limits, intervals within normal limits, no acute ST-T wave changes.   Recent Labs: 02/03/2017: B Natriuretic Peptide 475.8 02/06/2017: BUN 25; Creatinine, Ser 0.91; Hemoglobin 11.3; Platelets 295; Potassium 3.4; Sodium 135    Lipid Panel No results found for: CHOL, TRIG, HDL, CHOLHDL, VLDL, LDLCALC, LDLDIRECT    Wt Readings from Last 3 Encounters:  03/21/17 226 lb (102.5 kg)  02/06/17 234 lb 2.1 oz (106.2 kg)  10/11/16 236 lb 5.3 oz (107.2 kg)      Other studies Reviewed: Additional studies/ records that were reviewed today include: Hospital records, [place] records. Review of the  above records demonstrates:  Please see elsewhere in the note.     ASSESSMENT AND PLAN:  PULMONARY HTN:   I looked through [facility] records and found an echocardiogram from 2016.  Her RV systolic pressure was estimated to be 27.  Now she has pulmonary artery systolic pressure estimated to be 70 although I suspect this might be better since she is had acute treatment.  At this point she has a follow-up with [facility] in March and I would like her see them and probably repeat an echocardiogram.  I will give her a copy of her echo resolved from our hospital.  Should the pressure still be elevated she might  need right heart catheterization to further determine any change in management.  She does have some RV dysfunction which may have been exacerbated acutely and can be reassessed at a repeat echo.  However, I suspect this is a primary lung issue.  HTN:  The blood pressure is at target. No change in medications is indicated. We will continue with therapeutic lifestyle changes (TLC).  DM:    Last A1c was 6.3.  She will continue on meds as listed.   ACUTE DIASTOLIC HF: She seems to be euvolemic.  She will continue on the diuretic.  I will check a basic metabolic profile today.   SVT: She has had no recurrence of this.  No change in therapy is indicated.   Current medicines are reviewed at length with the patient today.  The patient does not have concerns regarding medicines.  The following changes have been made:  no change  Labs/ tests ordered today include:   Orders Placed This Encounter  Procedures  . Basic Metabolic Panel (BMET)     Disposition:   FU with me in six month.      Signed, Minus Breeding, MD  03/21/2017 10:01 AM    [place]

## 2017-03-21 ENCOUNTER — Ambulatory Visit (INDEPENDENT_AMBULATORY_CARE_PROVIDER_SITE_OTHER): Payer: 59 | Admitting: Cardiology

## 2017-03-21 ENCOUNTER — Encounter: Payer: Self-pay | Admitting: Cardiology

## 2017-03-21 VITALS — BP 110/80 | HR 73 | Ht 65.0 in | Wt 226.0 lb

## 2017-03-21 DIAGNOSIS — Z7189 Other specified counseling: Secondary | ICD-10-CM | POA: Insufficient documentation

## 2017-03-21 DIAGNOSIS — Z79899 Other long term (current) drug therapy: Secondary | ICD-10-CM | POA: Diagnosis not present

## 2017-03-21 DIAGNOSIS — I272 Pulmonary hypertension, unspecified: Secondary | ICD-10-CM | POA: Diagnosis not present

## 2017-03-21 DIAGNOSIS — I5033 Acute on chronic diastolic (congestive) heart failure: Secondary | ICD-10-CM | POA: Insufficient documentation

## 2017-03-21 DIAGNOSIS — I10 Essential (primary) hypertension: Secondary | ICD-10-CM | POA: Diagnosis not present

## 2017-03-21 DIAGNOSIS — I5032 Chronic diastolic (congestive) heart failure: Secondary | ICD-10-CM

## 2017-03-21 LAB — BASIC METABOLIC PANEL
BUN/Creatinine Ratio: 17 (ref 9–23)
BUN: 14 mg/dL (ref 6–24)
CALCIUM: 9.6 mg/dL (ref 8.7–10.2)
CO2: 28 mmol/L (ref 20–29)
CREATININE: 0.81 mg/dL (ref 0.57–1.00)
Chloride: 94 mmol/L — ABNORMAL LOW (ref 96–106)
GFR calc Af Amer: 100 mL/min/{1.73_m2} (ref >59)
GFR, EST NON AFRICAN AMERICAN: 87 mL/min/{1.73_m2} (ref >59)
GLUCOSE: 127 mg/dL — AB (ref 65–99)
Potassium: 3.7 mmol/L (ref 3.5–5.2)
SODIUM: 140 mmol/L (ref 134–144)

## 2017-03-21 NOTE — Patient Instructions (Addendum)
Medication Instructions:  Continue current medications  If you need a refill on your cardiac medications before your next appointment, please call your pharmacy.  Labwork: BMP Today HERE IN OUR OFFICE AT [facility]  Take the provided lab slips for you to take with you to the lab for you blood draw.   You will NOT need to fast   You may go to any [facility] lab that is convenient for you however, we do have a lab in our office that is able to assist you. You do NOT need an appointment for our lab. Once in our office lobby there is a podium to the right of the check-in desk where you are to sign-in and ring a doorbell to alert [place] you are here. Lab is open Monday-Friday from 8:00am to 4:00pm; and is closed for lunch from 12:45p-1:45pm   Testing/Procedures: None Ordered  Follow-Up: Your physician wants you to follow-up in: 6 Months. You should receive a reminder letter in the mail two months in advance. If you do not receive a letter, please call our office [phone].   Need to get an Echocardiogram at [facility]   Thank you for choosing [facility] at [facility]!!

## 2018-03-18 NOTE — Progress Notes (Signed)
Cardiology Office Note   Date:  03/20/2018   ID:  Felicia [name], DOB Aug 08, 1970, MRN [id number]  PCP:  Felicia Nip, MD  Cardiologist:   No primary care provider on file. Referring:  Self  Chief Complaint  Patient presents with  . Shortness of Breath      History of Present Illness: Felicia [name] is a 48 y.o. female who presents for evaluation of HTN.  She was in the hospital in 2018 for evaluation of respiratory failure.  This was multifactorial.  She did have acute diastolic HF.    EF was NL.   However, there was also significant pulmonary HTN.    She was treated for a non specific pneumonitis.  She has a long history of lung disease treated at [facility].  She responded to treatment in the hospital with steroids and  diuresis.   She returns for follow up.    Since I last saw her she is seen every 90 days at [facility].  She had a right heart catheterization.  I reviewed records.  She has been approved for inhaled prostacyclin but has yet to start this.  She remains on sildenafil.  She is on high flow oxygen.  She is not having any acute complaints.  She does take her diuretic as needed and does not need it every day.  She tries to watch her salt.  She is not describing new PND or orthopnea.  Of note her blood pressure was a little low the combination of medications and so at [name] they tried to reduce her beta-blocker atenolol but she developed tachypalpitations with this so she went back up on the higher dose.  She is not feeling the palpitations now she has no presyncope or syncope.  She has some mild occasional orthostatic symptoms.  Past Medical History:  Diagnosis Date  . Diabetes mellitus without complication ([place])    type 2   . Hypertension   . NSIP (nonspecific interstitial pneumonitis) ([place])   . SVT (supraventricular tachycardia) ([facility])     Past Surgical History:  Procedure Laterality Date  . CESAREAN SECTION    . LUNG BIOPSY       Current Outpatient  Medications  Medication Sig Dispense Refill  . albuterol (PROVENTIL HFA;VENTOLIN HFA) 108 (90 Base) MCG/ACT inhaler Inhale 1 puff into the lungs every 6 (six) hours as needed for wheezing or shortness of breath.    [name] atenolol (TENORMIN) 50 MG tablet Take 75 mg by mouth 2 (two) times daily.     . furosemide (LASIX) 40 MG tablet Take one tablet (50m) for weight gain of 2 lbs 30 tablet 0  . guaiFENesin (MUCINEX) 600 MG 12 hr tablet Take 600 mg by mouth daily as needed for to loosen phlegm.    . hydrochlorothiazide (HYDRODIURIL) 25 MG tablet Take 25 mg by mouth daily.    .Marland Kitchenipratropium-albuterol (DUONEB) 0.5-2.5 (3) MG/3ML SOLN Inhale 3 mLs into the lungs every 6 (six) hours as needed (sob and wheezing).     . metFORMIN (GLUCOPHAGE) 500 MG tablet Take 500 mg by mouth 2 (two) times daily with a meal.     . mycophenolate (CELLCEPT) 500 MG tablet Take 1,500 mg by mouth 2 (two) times daily. 3 tablets (1500 mg) bid    . potassium chloride 20 MEQ TBCR Take one tablet along with lasix, when you need to take lasix. 30 tablet 0  . predniSONE (DELTASONE) 10 MG tablet Take 10 mg by mouth daily with breakfast.    .  predniSONE (DELTASONE) 20 MG tablet Take 2 tablets twice daily for 3 days, then take 2 tablets once daily for 3 days, then take 1 tablet once daily for 3 days and then 1 tablet once daily as before 30 tablet 0  . tadalafil (ADCIRCA/CIALIS) 20 MG tablet Take 20 mg by mouth daily as needed for erectile dysfunction.    . verapamil (CALAN-SR) 120 MG CR tablet Take 1 tablet (120 mg total) by mouth at bedtime. (Patient taking differently: Take 120 mg by mouth every morning. ) 30 tablet 0   No current facility-administered medications for this visit.     Allergies:   Patient has no known allergies.    ROS:  Please see the history of present illness.   Otherwise, review of systems are positive for none.   All other systems are reviewed and negative.    PHYSICAL EXAM: VS:  BP 110/82   Pulse 88   Ht 5'  5" (1.651 m)   Wt 239 lb (108.4 kg)   SpO2 90%   BMI 39.77 kg/m  , BMI Body mass index is 39.77 kg/m. GENERAL:  Well appearing NECK:  Positive CV wave with jugular venous distention, waveform within normal limits, carotid upstroke brisk and symmetric, no bruits, no thyromegaly LUNGS:  Clear to auscultation bilaterally CHEST:  Unremarkable HEART:  PMI not displaced or sustained,S1 WNL, fixed split S2 , no S3, no S4, no clicks, no rubs, no murmurs ABD:  Flat, positive bowel sounds normal in frequency in pitch, no bruits, no rebound, no guarding, no midline pulsatile mass, no hepatomegaly, no splenomegaly EXT:  2 plus pulses throughout, trace leg edema, no cyanosis no clubbing   EKG:  EKG is not  ordered today. The ekg ordered 02/02/17 demonstrates sinus rhythm, rate 88, axis within normal limits,  no acute ST-T wave changes.  QT is slightly prolonged.  Nonspecific ST-T wave changes.  Recent Labs: 03/21/2017: BUN 14; Creatinine, Ser 0.81; Potassium 3.7; Sodium 140    Lipid Panel No results found for: CHOL, TRIG, HDL, CHOLHDL, VLDL, LDLCALC, LDLDIRECT    Wt Readings from Last 3 Encounters:  03/20/18 239 lb (108.4 kg)  03/21/17 226 lb (102.5 kg)  02/06/17 234 lb 2.1 oz (106.2 kg)    Lab Results  Component Value Date   HGBA1C 6.3 (H) 02/03/2017    Other studies Reviewed: Additional studies/ records that were reviewed today include:   Records from [facility] Review of the above records demonstrates:  As above   ASSESSMENT AND PLAN:  PULMONARY HTN:    She has had follow-up right heart catheterization and is actively being managed at [facility] with the addition of inhaled prostacyclin.  At this point I do not think further testing on my part is indicated.  We are addressing her diastolic dysfunction as below.  HTN: The blood pressure is low but she tolerates this and she is not had any syncope.  We talked about precautions related to orthostatic hypotension.  At this point no change in  therapy is indicated.  DM:    Last A1c was 6.3.  She says she has had this repeated since but I do not have these records.  I will defer to her primary physician.  ACUTE DIASTOLIC HF:     She seems to be euvolemic.  She has some trace lower extremity swelling.  She will continue with as needed diuretics we talked again about salt restriction.  SVT:    She does well as long as she  takes to be beta-blocker at the prescribed dose.  She is also on verapamil.  No change in therapy.  Current medicines are reviewed at length with the patient today.  The patient does not have concerns regarding medicines.  The following changes have been made:   None  Labs/ tests ordered today include:  None  Orders Placed This Encounter  Procedures  . EKG 12-Lead     Disposition:   FU with me in 12 month.      Signed, Minus Breeding, MD  03/20/2018 2:36 PM    [facility]

## 2018-03-20 ENCOUNTER — Encounter (INDEPENDENT_AMBULATORY_CARE_PROVIDER_SITE_OTHER): Payer: Self-pay

## 2018-03-20 ENCOUNTER — Encounter: Payer: Self-pay | Admitting: Cardiology

## 2018-03-20 ENCOUNTER — Ambulatory Visit (INDEPENDENT_AMBULATORY_CARE_PROVIDER_SITE_OTHER): Payer: 59 | Admitting: Cardiology

## 2018-03-20 VITALS — BP 110/82 | HR 88 | Ht 65.0 in | Wt 239.0 lb

## 2018-03-20 DIAGNOSIS — R002 Palpitations: Secondary | ICD-10-CM

## 2018-03-20 DIAGNOSIS — I5032 Chronic diastolic (congestive) heart failure: Secondary | ICD-10-CM

## 2018-03-20 DIAGNOSIS — I10 Essential (primary) hypertension: Secondary | ICD-10-CM | POA: Diagnosis not present

## 2018-03-20 DIAGNOSIS — I272 Pulmonary hypertension, unspecified: Secondary | ICD-10-CM | POA: Diagnosis not present

## 2018-03-20 NOTE — Patient Instructions (Signed)
Medication Instructions:  Continue current medications  If you need a refill on your cardiac medications before your next appointment, please call your pharmacy.  Labwork: None Ordered   Take the provided lab slips with you to the lab for your blood draw.   When you have your labs (blood work) drawn today and your tests are completely normal, you will receive your results only by MyChart Message (if you have MyChart) -OR-  A paper copy in the mail.  If you have any lab test that is abnormal or we need to change your treatment, we will call you to review these results.  Testing/Procedures: None Ordered   Follow-Up: You will need a follow up appointment in 1 Year.  Please call our office 2 months in advance to schedule this appointment.  You may see Dr Percival Spanish or one of the following Advanced Practice Providers on your designated Care Team:   Rosaria Ferries, PA-C . Jory Sims, DNP, ANP    At [facility], you and your health needs are our priority.  As part of our continuing mission to provide you with exceptional heart care, we have created designated Provider Care Teams.  These Care Teams include your primary Cardiologist (physician) and Advanced Practice Providers (APPs -  Physician Assistants and Nurse Practitioners) who all work together to provide you with the care you need, when you need it.   Thank you for choosing [facility] at [facility]!!

## 2018-03-27 ENCOUNTER — Inpatient Hospital Stay (HOSPITAL_COMMUNITY)
Admission: EM | Admit: 2018-03-27 | Discharge: 2018-03-31 | DRG: 196 | Disposition: A | Discharge status: Home / Self Care | Payer: 59 | Attending: Internal Medicine | Admitting: Internal Medicine

## 2018-03-27 ENCOUNTER — Inpatient Hospital Stay (HOSPITAL_COMMUNITY): Payer: 59

## 2018-03-27 ENCOUNTER — Other Ambulatory Visit: Payer: Self-pay

## 2018-03-27 ENCOUNTER — Emergency Department (HOSPITAL_COMMUNITY): Payer: 59

## 2018-03-27 ENCOUNTER — Encounter: Payer: Self-pay | Admitting: Emergency Medicine

## 2018-03-27 DIAGNOSIS — I10 Essential (primary) hypertension: Secondary | ICD-10-CM | POA: Diagnosis present

## 2018-03-27 DIAGNOSIS — Z9981 Dependence on supplemental oxygen: Secondary | ICD-10-CM | POA: Diagnosis not present

## 2018-03-27 DIAGNOSIS — J189 Pneumonia, unspecified organism: Secondary | ICD-10-CM | POA: Diagnosis present

## 2018-03-27 DIAGNOSIS — R197 Diarrhea, unspecified: Secondary | ICD-10-CM | POA: Diagnosis present

## 2018-03-27 DIAGNOSIS — Z8249 Family history of ischemic heart disease and other diseases of the circulatory system: Secondary | ICD-10-CM

## 2018-03-27 DIAGNOSIS — I2729 Other secondary pulmonary hypertension: Secondary | ICD-10-CM | POA: Diagnosis present

## 2018-03-27 DIAGNOSIS — Z7984 Long term (current) use of oral hypoglycemic drugs: Secondary | ICD-10-CM | POA: Diagnosis not present

## 2018-03-27 DIAGNOSIS — J8489 Other specified interstitial pulmonary diseases: Principal | ICD-10-CM | POA: Diagnosis present

## 2018-03-27 DIAGNOSIS — I5032 Chronic diastolic (congestive) heart failure: Secondary | ICD-10-CM | POA: Diagnosis present

## 2018-03-27 DIAGNOSIS — E876 Hypokalemia: Secondary | ICD-10-CM | POA: Diagnosis present

## 2018-03-27 DIAGNOSIS — R0602 Shortness of breath: Secondary | ICD-10-CM

## 2018-03-27 DIAGNOSIS — I110 Hypertensive heart disease with heart failure: Secondary | ICD-10-CM | POA: Diagnosis present

## 2018-03-27 DIAGNOSIS — Z833 Family history of diabetes mellitus: Secondary | ICD-10-CM | POA: Diagnosis not present

## 2018-03-27 DIAGNOSIS — J8410 Pulmonary fibrosis, unspecified: Secondary | ICD-10-CM | POA: Diagnosis present

## 2018-03-27 DIAGNOSIS — E119 Type 2 diabetes mellitus without complications: Secondary | ICD-10-CM | POA: Diagnosis present

## 2018-03-27 DIAGNOSIS — I471 Supraventricular tachycardia, unspecified: Secondary | ICD-10-CM | POA: Diagnosis present

## 2018-03-27 DIAGNOSIS — J9621 Acute and chronic respiratory failure with hypoxia: Secondary | ICD-10-CM | POA: Diagnosis present

## 2018-03-27 DIAGNOSIS — J849 Interstitial pulmonary disease, unspecified: Secondary | ICD-10-CM | POA: Diagnosis not present

## 2018-03-27 DIAGNOSIS — I272 Pulmonary hypertension, unspecified: Secondary | ICD-10-CM | POA: Diagnosis present

## 2018-03-27 DIAGNOSIS — Z79899 Other long term (current) drug therapy: Secondary | ICD-10-CM

## 2018-03-27 DIAGNOSIS — R9431 Abnormal electrocardiogram [ECG] [EKG]: Secondary | ICD-10-CM | POA: Diagnosis present

## 2018-03-27 DIAGNOSIS — Z794 Long term (current) use of insulin: Secondary | ICD-10-CM

## 2018-03-27 DIAGNOSIS — Z7952 Long term (current) use of systemic steroids: Secondary | ICD-10-CM

## 2018-03-27 DIAGNOSIS — I5033 Acute on chronic diastolic (congestive) heart failure: Secondary | ICD-10-CM | POA: Diagnosis present

## 2018-03-27 LAB — CBC
HEMATOCRIT: 41.9 % (ref 36.0–46.0)
Hemoglobin: 11.7 g/dL — ABNORMAL LOW (ref 12.0–15.0)
MCH: 23.7 pg — AB (ref 26.0–34.0)
MCHC: 27.9 g/dL — ABNORMAL LOW (ref 30.0–36.0)
MCV: 84.8 fL (ref 80.0–100.0)
Platelets: 314 10*3/uL (ref 150–400)
RBC: 4.94 MIL/uL (ref 3.87–5.11)
RDW: 18 % — ABNORMAL HIGH (ref 11.5–15.5)
WBC: 11.6 10*3/uL — AB (ref 4.0–10.5)
nRBC: 0.2 % (ref 0.0–0.2)

## 2018-03-27 LAB — GLUCOSE, CAPILLARY
Glucose-Capillary: 186 mg/dL — ABNORMAL HIGH (ref 70–99)
Glucose-Capillary: 204 mg/dL — ABNORMAL HIGH (ref 70–99)
Glucose-Capillary: 253 mg/dL — ABNORMAL HIGH (ref 70–99)
Glucose-Capillary: 291 mg/dL — ABNORMAL HIGH (ref 70–99)

## 2018-03-27 LAB — COMPREHENSIVE METABOLIC PANEL
ALBUMIN: 3.6 g/dL (ref 3.5–5.0)
ALK PHOS: 54 U/L (ref 38–126)
ALT: 14 U/L (ref 0–44)
AST: 23 U/L (ref 15–41)
Anion gap: 15 (ref 5–15)
BUN: 9 mg/dL (ref 6–20)
CALCIUM: 8.7 mg/dL — AB (ref 8.9–10.3)
CO2: 23 mmol/L (ref 22–32)
CREATININE: 0.8 mg/dL (ref 0.44–1.00)
Chloride: 101 mmol/L (ref 98–111)
GFR calc Af Amer: >60 mL/min (ref >60)
GFR calc non Af Amer: >60 mL/min (ref >60)
GLUCOSE: 159 mg/dL — AB (ref 70–99)
Potassium: 3.3 mmol/L — ABNORMAL LOW (ref 3.5–5.1)
Sodium: 139 mmol/L (ref 135–145)
Total Bilirubin: 0.9 mg/dL (ref 0.3–1.2)
Total Protein: 7.3 g/dL (ref 6.5–8.1)

## 2018-03-27 LAB — MAGNESIUM: Magnesium: 1.7 mg/dL (ref 1.7–2.4)

## 2018-03-27 LAB — RESPIRATORY PANEL BY PCR

## 2018-03-27 LAB — BLOOD GAS, ARTERIAL
Acid-Base Excess: 2.1 mmol/L — ABNORMAL HIGH (ref 0.0–2.0)
Bicarbonate: 25.9 mmol/L (ref 20.0–28.0)
Drawn by: 270211
FIO2: 1
O2 Saturation: 98.4 %
Patient temperature: 98.6
pCO2 arterial: 39.1 mmHg (ref 32.0–48.0)
pH, Arterial: 7.436 (ref 7.350–7.450)
pO2, Arterial: 121 mmHg — ABNORMAL HIGH (ref 83.0–108.0)

## 2018-03-27 LAB — INFLUENZA PANEL BY PCR (TYPE A & B)
Influenza A By PCR: NEGATIVE
Influenza B By PCR: NEGATIVE

## 2018-03-27 LAB — MRSA PCR SCREENING: MRSA by PCR: NEGATIVE

## 2018-03-27 LAB — STREP PNEUMONIAE URINARY ANTIGEN: Strep Pneumo Urinary Antigen: NEGATIVE

## 2018-03-27 LAB — BRAIN NATRIURETIC PEPTIDE: B Natriuretic Peptide: 456 pg/mL — ABNORMAL HIGH (ref 0.0–100.0)

## 2018-03-27 LAB — I-STAT TROPONIN, ED: Troponin i, poc: 0.02 ng/mL (ref 0.00–0.08)

## 2018-03-27 MED ORDER — VERAPAMIL HCL ER 120 MG PO TBCR
120.0000 mg | EXTENDED_RELEASE_TABLET | Freq: Every day | ORAL | Status: DC
  Administered 2018-03-27 – 2018-03-31 (×5): 120 mg via ORAL
  Filled 2018-03-27 (×5): qty 1

## 2018-03-27 MED ORDER — METHYLPREDNISOLONE SODIUM SUCC 125 MG IJ SOLR
80.0000 mg | Freq: Four times a day (QID) | INTRAMUSCULAR | Status: DC
  Administered 2018-03-27 – 2018-03-29 (×8): 80 mg via INTRAVENOUS
  Filled 2018-03-27 (×8): qty 2

## 2018-03-27 MED ORDER — ATENOLOL 25 MG PO TABS
75.0000 mg | ORAL_TABLET | Freq: Two times a day (BID) | ORAL | Status: DC
  Administered 2018-03-27 – 2018-03-31 (×9): 75 mg via ORAL
  Filled 2018-03-27 (×9): qty 3

## 2018-03-27 MED ORDER — INSULIN ASPART 100 UNIT/ML ~~LOC~~ SOLN
0.0000 [IU] | Freq: Every day | SUBCUTANEOUS | Status: DC
  Administered 2018-03-28 – 2018-03-30 (×2): 2 [IU] via SUBCUTANEOUS

## 2018-03-27 MED ORDER — IOPAMIDOL (ISOVUE-370) INJECTION 76%
100.0000 mL | Freq: Once | INTRAVENOUS | Status: AC | PRN
  Administered 2018-03-27: 79 mL via INTRAVENOUS

## 2018-03-27 MED ORDER — SODIUM CHLORIDE 0.9 % IV SOLN: 500.0000 mg | INTRAVENOUS | Status: DC

## 2018-03-27 MED ORDER — VANCOMYCIN HCL 10 G IV SOLR
1500.0000 mg | Freq: Once | INTRAVENOUS | Status: AC
  Administered 2018-03-27: 1500 mg via INTRAVENOUS
  Filled 2018-03-27: qty 1500

## 2018-03-27 MED ORDER — VANCOMYCIN HCL IN DEXTROSE 750-5 MG/150ML-% IV SOLN
750.0000 mg | Freq: Two times a day (BID) | INTRAVENOUS | Status: DC
  Administered 2018-03-27: 750 mg via INTRAVENOUS
  Filled 2018-03-27 (×2): qty 150

## 2018-03-27 MED ORDER — SODIUM CHLORIDE 0.9 % IV SOLN
2.0000 g | Freq: Three times a day (TID) | INTRAVENOUS | Status: DC
  Administered 2018-03-27 – 2018-03-29 (×6): 2 g via INTRAVENOUS
  Filled 2018-03-27 (×6): qty 2

## 2018-03-27 MED ORDER — SODIUM CHLORIDE 0.9 % IV SOLN: 1.0000 g | INTRAVENOUS | Status: DC

## 2018-03-27 MED ORDER — POTASSIUM CHLORIDE CRYS ER 20 MEQ PO TBCR
40.0000 meq | EXTENDED_RELEASE_TABLET | Freq: Once | ORAL | Status: AC
  Administered 2018-03-27: 40 meq via ORAL
  Filled 2018-03-27: qty 2

## 2018-03-27 MED ORDER — MYCOPHENOLATE MOFETIL 250 MG PO CAPS
1500.0000 mg | ORAL_CAPSULE | Freq: Two times a day (BID) | ORAL | Status: DC
  Administered 2018-03-27 – 2018-03-31 (×9): 1500 mg via ORAL
  Filled 2018-03-27 (×9): qty 6

## 2018-03-27 MED ORDER — ACETAMINOPHEN 325 MG PO TABS: 650.0000 mg | ORAL_TABLET | Freq: Four times a day (QID) | ORAL | Status: DC | PRN

## 2018-03-27 MED ORDER — IOPAMIDOL (ISOVUE-370) INJECTION 76%
INTRAVENOUS | Status: AC
  Filled 2018-03-27: qty 100

## 2018-03-27 MED ORDER — ALBUTEROL (5 MG/ML) CONTINUOUS INHALATION SOLN
10.0000 mg/h | INHALATION_SOLUTION | Freq: Once | RESPIRATORY_TRACT | Status: AC
Start: 2018-03-27 — End: 2018-03-27
  Administered 2018-03-27: 10 mg/h via RESPIRATORY_TRACT
  Filled 2018-03-27: qty 20

## 2018-03-27 MED ORDER — ALBUTEROL SULFATE (2.5 MG/3ML) 0.083% IN NEBU
2.5000 mg | INHALATION_SOLUTION | RESPIRATORY_TRACT | Status: DC | PRN
  Administered 2018-03-30: 2.5 mg via RESPIRATORY_TRACT
  Filled 2018-03-27: qty 3

## 2018-03-27 MED ORDER — METHYLPREDNISOLONE SODIUM SUCC 125 MG IJ SOLR
125.0000 mg | Freq: Once | INTRAMUSCULAR | Status: AC
  Administered 2018-03-27: 125 mg via INTRAVENOUS
  Filled 2018-03-27: qty 2

## 2018-03-27 MED ORDER — ENOXAPARIN SODIUM 40 MG/0.4ML ~~LOC~~ SOLN
40.0000 mg | SUBCUTANEOUS | Status: DC
  Administered 2018-03-27 – 2018-03-30 (×4): 40 mg via SUBCUTANEOUS
  Filled 2018-03-27 (×5): qty 0.4

## 2018-03-27 MED ORDER — IPRATROPIUM-ALBUTEROL 0.5-2.5 (3) MG/3ML IN SOLN
3.0000 mL | Freq: Four times a day (QID) | RESPIRATORY_TRACT | Status: DC
  Administered 2018-03-27 – 2018-03-31 (×16): 3 mL via RESPIRATORY_TRACT
  Filled 2018-03-27 (×15): qty 3

## 2018-03-27 MED ORDER — INSULIN ASPART 100 UNIT/ML ~~LOC~~ SOLN
0.0000 [IU] | Freq: Three times a day (TID) | SUBCUTANEOUS | Status: DC
  Administered 2018-03-27 (×2): 5 [IU] via SUBCUTANEOUS
  Administered 2018-03-28: 2 [IU] via SUBCUTANEOUS
  Administered 2018-03-28 (×2): 3 [IU] via SUBCUTANEOUS
  Administered 2018-03-29: 2 [IU] via SUBCUTANEOUS
  Administered 2018-03-29 – 2018-03-30 (×3): 3 [IU] via SUBCUTANEOUS
  Administered 2018-03-30: 2 [IU] via SUBCUTANEOUS
  Administered 2018-03-30: 3 [IU] via SUBCUTANEOUS
  Administered 2018-03-31: 1 [IU] via SUBCUTANEOUS
  Administered 2018-03-31: 2 [IU] via SUBCUTANEOUS

## 2018-03-27 MED ORDER — ORAL CARE MOUTH RINSE
15.0000 mL | Freq: Two times a day (BID) | OROMUCOSAL | Status: DC
  Administered 2018-03-28 – 2018-03-31 (×7): 15 mL via OROMUCOSAL

## 2018-03-27 MED ORDER — TADALAFIL 20 MG PO TABS
20.0000 mg | ORAL_TABLET | Freq: Two times a day (BID) | ORAL | Status: DC
  Administered 2018-03-27 – 2018-03-31 (×9): 20 mg via ORAL
  Filled 2018-03-27 (×9): qty 1

## 2018-03-27 MED ORDER — FUROSEMIDE 10 MG/ML IJ SOLN
40.0000 mg | Freq: Two times a day (BID) | INTRAMUSCULAR | Status: DC
  Administered 2018-03-27 – 2018-03-29 (×4): 40 mg via INTRAVENOUS
  Filled 2018-03-27 (×4): qty 4

## 2018-03-27 MED ORDER — POLYETHYLENE GLYCOL 3350 17 G PO PACK: 17.0000 g | PACK | Freq: Every day | ORAL | Status: DC | PRN

## 2018-03-27 MED ORDER — SODIUM CHLORIDE (PF) 0.9 % IJ SOLN
INTRAMUSCULAR | Status: AC
  Filled 2018-03-27: qty 50

## 2018-03-27 MED ORDER — FUROSEMIDE 10 MG/ML IJ SOLN
60.0000 mg | Freq: Once | INTRAMUSCULAR | Status: AC
  Administered 2018-03-27: 60 mg via INTRAVENOUS
  Filled 2018-03-27: qty 8

## 2018-03-27 MED ORDER — ACETAMINOPHEN 650 MG RE SUPP: 650.0000 mg | Freq: Four times a day (QID) | RECTAL | Status: DC | PRN

## 2018-03-27 MED ORDER — SODIUM CHLORIDE 0.9 % IV SOLN
2.0000 g | Freq: Once | INTRAVENOUS | Status: AC
  Administered 2018-03-27: 2 g via INTRAVENOUS
  Filled 2018-03-27: qty 2

## 2018-03-27 NOTE — ED Notes (Signed)
Bed: RESA Expected date:  Expected time:  Means of arrival:  Comments: EMS: 48 yo F SOB on non-rebreather

## 2018-03-27 NOTE — Progress Notes (Signed)
Pharmacy Antibiotic Note  Felicia [name] is a 48 y.o. female with a h/o interstitial pneumonitis and recent antibiotics use admitted on 03/27/2018 with shortness of breath, possible pna.  Pharmacy has been consulted for vancomycin and zosyn dosing.  Plan: Cefepime 2 g iv q8h.   Vancomycin 1500 mg iv once then 750 mg iv q 12 hours. AUC 403 IBW/TBW/Vd 0.5.   - f/u MRSA PCR - f/u renal function, culture results, plans for antibiotics      Temp (24hrs), Avg:98.2 F (36.8 C), Min:98 F (36.7 C), Max:98.4 F (36.9 C)  Recent Labs  Lab 03/27/18 0601 03/27/18 0606  WBC 11.6*  --   CREATININE  --  0.80    Estimated Creatinine Clearance: 105.4 mL/min (by C-G formula based on SCr of 0.8 mg/dL).    No Known Allergies  Antimicrobials this admission: 2/18 cefepime >>  2/18 vancomycin >>   Dose adjustments this admission:   Microbiology results: 2/18 BCx: ngtd Sputum:  Flu A/B: negative 2/18 respiratory panel:   2/18 MRSA PCR:   Thank you for allowing pharmacy to be a part of this patient's care.  Ulice Dash D 03/27/2018 10:28 AM

## 2018-03-27 NOTE — Consult Note (Signed)
NAME:  Felicia [name], MRN:  [id number], DOB:  [birth year], LOS: 0 ADMISSION DATE:  03/27/2018, CONSULTATION DATE:  03/27/2018  REFERRING MD:  Dr Florene Glen, CHIEF COMPLAINT: Shortness of breath  Brief History   Patient presented with shortness of breath of 4-5 days duration Recent URI symptoms Background history of NSIP She recently had 2 courses of rituximab-was discontinued Has been on CellCept Has pulmonary hypertension Diastolic heart failure On very high flow oxygen at baseline with increased requirement in the last few days  History of present illness   Recent URI symptoms Recent treatment for sinus infection for which he used a course of azithromycin followed up with doxycycline She did not recover completely Progressive symptoms of shortness of breath, fatigue, has felt warm but no documented fevers Increased cough, congestion, clear mucus expectoration Has not been exposed to anybody with a febrile illness  Past Medical History   Past Medical History:  Diagnosis Date  . Diabetes mellitus without complication ([place])    type 2   . Hypertension   . NSIP (nonspecific interstitial pneumonitis) ([place])   . SVT (supraventricular tachycardia) (HCC)    Pulmonary hypertension  Significant Hospital Events   Shortness of breath, tachypnea, on a nonrebreather at present  Consults:  PCCM 03/27/2018  Procedures:    Significant Diagnostic Tests:  Chest x-ray with bibasal haziness, cardiomegaly  Micro Data:  Influenza a and B by PCR negative Viral panel pending Antimicrobials:  Received vancomycin and cefepime Vancomycin 2/18/ Cefepime  Interim history/subjective:  Shortness of breath does feel little bit better according to the patient, visually still quite tachypneic  Objective   Blood pressure (!) 111/54, pulse (!) 44, temperature 98.4 F (36.9 C), temperature source Oral, resp. rate (!) 44, SpO2 100 %.       No intake or output data in the 24 hours ending  03/27/18 0905 There were no vitals filed for this visit.  Examination: General: Middle-aged lady, does not appear to be in extremis-increased work of breathing, obese HENT: Moist oral mucosa Lungs: Bibasal rales Cardiovascular: S1-S2 appreciated Abdomen: Bowel sounds appreciated Extremities: No clubbing, no edema Neuro: Awake and alert x3, moving all extremities  Echocardiogram at [name] December 2018 the RV is severely enlarged and hypokinetic artery moderately enlarged RVSP of 70 with dilated IVC normal LV. An echo in October 2016 showed normal LV with mild LVH normal left atrial size RV then was mildly enlarged but had normal function and RVSP of 27  Pulmonary function test January 2019 FVC 1.25 L 36% predicted, FEV1 1.18 L 40% predicted with DLCO of 17  Resolved Hospital Problem list     Assessment & Plan:  Acute on chronic respiratory failure -Secondary to exacerbation of NSIP versus lower respiratory infection -Possible pneumonia -Possible viral exacerbation  -We will continue oxygen supplementation -Continue aggressive measures to support respiratory status -ABG on 100% FiO2 shows a PO2 of 120 with adequate compensation -Risk of decompensation is still quite significant  I did discuss the possibility of progression and increased oxygen requirement which may require the use of a noninvasive positive pressure ventilator-BiPAP which she is agreeable with at present I did discuss possibility of requiring a ventilator-she does not want to be on a ventilator regardless of outcome The likelihood of being able to wean off the ventilator is very slim -This discussion was had with patients sister present  Possible pneumonia -Continue vancomycin and cefepime -Adjust antibiotics based on cultures -Recent exposure to azithromycin and doxycycline  Nonspecific interstitial  pneumonitis -Patient has been on CellCept and prednisone at 20 mg -Recently was tried on Rituxan  which she failed  -We will continue supportive measures -Treat possible infection -She is cognizant of the prognosis of pulmonary fibrosis exacerbation  -CT scan of the chest ordered-we will follow  Congestive heart failure -Has a history of diastolic heart failure -Right heart failure relating to chronic lung disease on chronic hypoxemia -BNP is elevated at present  -We will continue diuresis -Watch for contraction  History of diastolic heart failure -Last echo was in 2018 -We will repeat echocardiogram to assess cardiac function and pulmonary pressures  History of supraventricular tachycardia -We will continue beta-blockade  Pulmonary hypertension -Pulmonary hypertension is severe -Has been tolerating tadalafil -Discussions ongoing about starting Tyvaso -We will reassess with echocardiogram  Diabetes -SSI    We will continue broad coverage as she had had recent antibiotic coverage for URI Tailor antibiotics as cultures become available  Risk of progression was discussed extensively with patient She does appear to be holding steady on supplemental oxygen at present The goal is to keep as dry as we can Cover the bases regarding an infectious cause of decompensation  Discussed with Dr. Fayrene Helper Discussed with sibling at bedside  Best practice:  Diet: heart healthy Pain/Anxiety/Delirium protocol (if indicated): As needed VAP protocol (if indicated): n/a DVT prophylaxis: Lovenox GI prophylaxis: Protonix Glucose control: ssi Mobility: Bedrest Code Status: Full Family Communication: sister at bedside Disposition: ICU  Labs   CBC: Recent Labs  Lab 03/27/18 0601  WBC 11.6*  HGB 11.7*  HCT 41.9  MCV 84.8  PLT 409    Basic Metabolic Panel: Recent Labs  Lab 03/27/18 0606  NA 139  K 3.3*  CL 101  CO2 23  GLUCOSE 159*  BUN 9  CREATININE 0.80  CALCIUM 8.7*   GFR: Estimated Creatinine Clearance: 105.4 mL/min (by C-G formula based on SCr of  0.8 mg/dL). Recent Labs  Lab 03/27/18 0601  WBC 11.6*    Liver Function Tests: Recent Labs  Lab 03/27/18 0606  AST 23  ALT 14  ALKPHOS 54  BILITOT 0.9  PROT 7.3  ALBUMIN 3.6   No results for input(s): LIPASE, AMYLASE in the last 168 hours. No results for input(s): AMMONIA in the last 168 hours.  ABG    Component Value Date/Time   PHART 7.366 12/27/2008 0420   PCO2ART 45.6 (H) 12/27/2008 0420   PO2ART 86.3 12/27/2008 0420   HCO3 25.5 (H) 12/27/2008 0420   TCO2 26.9 12/27/2008 0420   O2SAT 97.5 12/27/2008 0420     Coagulation Profile: No results for input(s): INR, PROTIME in the last 168 hours.  Cardiac Enzymes: No results for input(s): CKTOTAL, CKMB, CKMBINDEX, TROPONINI in the last 168 hours.  HbA1C: Hgb A1c MFr Bld  Date/Time Value Ref Range Status  02/03/2017 07:55 AM 6.3 (H) 4.8 - 5.6 % Final    Comment:    (NOTE) Pre diabetes:          5.7%-6.4% Diabetes:              >6.4% Glycemic control for   <7.0% adults with diabetes     CBG: No results for input(s): GLUCAP in the last 168 hours.  Review of Systems:   Review of Systems  Constitutional: Positive for malaise/fatigue.  Eyes: Negative.   Respiratory: Positive for cough, sputum production and shortness of breath.   Gastrointestinal: Negative.   Genitourinary: Negative.   Musculoskeletal: Negative.   Neurological: Negative.  Endo/Heme/Allergies: Negative.      Past Medical History  She,  has a past medical history of Diabetes mellitus without complication ([place]), Hypertension, NSIP (nonspecific interstitial pneumonitis) ([place]), and SVT (supraventricular tachycardia) ([place]).   Surgical History    Past Surgical History:  Procedure Laterality Date  . CESAREAN SECTION    . LUNG BIOPSY       Social History   reports that she has never smoked. She has never used smokeless tobacco. She reports that she does not drink alcohol or use drugs.   Family History   Her family history includes  Cancer in her father, maternal grandfather, maternal grandmother, paternal grandfather, and paternal grandmother; Diabetes in her father and sister; Hypertension in her father and mother; Mental retardation in her sister.   Allergies No Known Allergies   Home Medications  Prior to Admission medications   Medication Sig Start Date End Date Taking? Authorizing Provider  albuterol (PROVENTIL HFA;VENTOLIN HFA) 108 (90 Base) MCG/ACT inhaler Inhale 1 puff into the lungs every 6 (six) hours as needed for wheezing or shortness of breath.   Yes [provider]  atenolol (TENORMIN) 50 MG tablet Take 75 mg by mouth 2 (two) times daily.  01/08/17  Yes [provider]  fluticasone (FLONASE) 50 MCG/ACT nasal spray Place 2 sprays into both nostrils daily as needed for allergies or rhinitis.   Yes [provider]  furosemide (LASIX) 40 MG tablet Take one tablet (22m) for weight gain of 2 lbs Patient taking differently: Take 40 mg by mouth daily as needed for fluid (2lb weight gain). Take one tablet (464m for weight gain of 2 lbs 02/06/17  Yes KrBonnielee HaffMD  guaiFENesin (MUCINEX) 600 MG 12 hr tablet Take 600 mg by mouth daily as needed for to loosen phlegm.   Yes [provider]  hydrochlorothiazide (HYDRODIURIL) 25 MG tablet Take 25 mg by mouth daily. 06/30/14  Yes [provider]  ibuprofen (ADVIL,MOTRIN) 200 MG tablet Take 200 mg by mouth every 6 (six) hours as needed for headache or mild pain.   Yes [provider]  ipratropium-albuterol (DUONEB) 0.5-2.5 (3) MG/3ML SOLN Inhale 3 mLs into the lungs every 6 (six) hours as needed (sob and wheezing).  01/08/17  Yes [provider]  metFORMIN (GLUCOPHAGE) 500 MG tablet Take 500 mg by mouth 2 (two) times daily with a meal.    Yes [provider]  mycophenolate (CELLCEPT) 500 MG tablet Take 1,500 mg by mouth 2 (two) times daily. 3 tablets (1500 mg) bid   Yes [provider]    potassium chloride 20 MEQ TBCR Take one tablet along with lasix, when you need to take lasix. Patient taking differently: Take 20 mg by mouth daily as needed (take if she takes lasix.). Take one tablet along with lasix, when you need to take lasix. 02/06/17  Yes KrBonnielee HaffMD  predniSONE (DELTASONE) 20 MG tablet Take 20 mg by mouth daily with breakfast.   Yes [provider]  tadalafil (ADCIRCA/CIALIS) 20 MG tablet Take 20 mg by mouth daily as needed for erectile dysfunction.   Yes [provider]  verapamil (CALAN-SR) 120 MG CR tablet Take 1 tablet (120 mg total) by mouth at bedtime. Patient taking differently: Take 120 mg by mouth every morning.  09/23/14  Yes SmEtta QuillNP  predniSONE (DELTASONE) 20 MG tablet Take 2 tablets twice daily for 3 days, then take 2 tablets once daily for 3 days, then take 1 tablet once  daily for 3 days and then 1 tablet once daily as before Patient not taking: Reported on 03/27/2018 02/06/17   Bonnielee Haff, MD     Critical care time: 37mnutes time spent evaluating patient reviewing records, reviewing outside records Risk of decompensation is very high

## 2018-03-27 NOTE — H&P (Addendum)
**Note De-Identified vi Obfusction** History nd Physicl    Christene Pounds TIR:[id number] DOB: Jan 12, 1971 DOA: 03/27/2018  PCP: Arett Nip, MD  Ptient coming from: home  I hve personlly briefly reviewed ptient's old medicl records in [place]  Chief Complint: shortness of breth  HPI: Felicia [name] is  48 y.o. femle with medicl history significnt of NSIP on 10-15 L t home, pulmonry hypertension, HFpEF, hypertension, T2DM, SVT presents with worsening shortness of breth.   She notes tht her symptoms strted bout 3 weeks go when she hd  sinus infection.  Treted with zithro nd doxy.  She sttes tht she hs been on not return to her bseline yet.  Lst 5 dys things hve gotten progressively worse.  She is noticed progressive shortness of breth, cough with mucus, nd ftigue.  She notes subjective fevers nd chills.  She notes chest pin only with her cough.  She notes nuse with the cough.  She denies ny bdominl pin or vomiting.  She uses 10 L t home typiclly, but recently she hs been dding n dditionl 5 L with  second nsl cnnul.  She denies smoking or drinking.  She denies sick contcts, but did see the crdiologist lst week nd the pulmonologist within the pst month.    ED Course: Lbs, CXR, steroids, bx, lsix.  Admit for cute on chronic hypoxic respirtory filure.  Review of Systems: As per HPI otherwise 10 point review of systems negtive.   Pst Medicl History:  Dignosis Dte  . Dibetes mellitus without compliction ([place])    type 2   . Hypertension   . NSIP (nonspecific interstitil pneumonitis) ([place])   . SVT (suprventriculr tchycrdi) (HCC)     Pst Surgicl History:  Procedure Lterlity Dte  . CESAREAN SECTION    . LUNG BIOPSY       reports tht she hs never smoked. She hs never used smokeless tobcco. She reports tht she does not drink lcohol or use drugs.  No Known Allergies  Fmily History  Problem Reltion Age of Onset  .  Hypertension Mother   . Cncer Fther   . Dibetes Fther   . Hypertension Fther   . Dibetes Sister   . Mentl retrdtion Sister   . Cncer Mternl Grndmother   . Cncer Mternl Grndfther   . Cncer Pternl Grndmother   . Cncer Pternl Grndfther    Prior to Admission medictions   Mediction Sig Strt Dte End Dte Tking? Authorizing Provider  tenolol (TENORMIN) 50 MG tblet Tke 75 mg by mouth 2 (two) times dily.  01/08/17  Yes Provider, Historicl, MD  metFORMIN (GLUCOPHAGE) 500 MG tblet Tke 500 mg by mouth 2 (two) times dily with  mel.    Yes Provider, Historicl, MD  mycophenolte (CELLCEPT) 500 MG tblet Tke 1,500 mg by mouth 2 (two) times dily. 3 tblets (1500 mg) bid   Yes Provider, Historicl, MD  lbuterol (PROVENTIL HFA;VENTOLIN HFA) 108 (90 Bse) MCG/ACT inhler Inhle 1 puff into the lungs every 6 (six) hours s needed for wheezing or shortness of breth.    Provider, Historicl, MD  furosemide (LASIX) 40 MG tblet Tke one tblet (43m) for weight gin of 2 lbs 02/06/17   KBonnielee Hff MD  guiFENesin (MUCINEX) 600 MG 12 hr tblet Tke 600 mg by mouth dily s needed for to loosen phlegm.    Provider, Historicl, MD  hydrochlorothizide (HYDRODIURIL) 25 MG tblet Tke 25 mg by mouth dily. 06/30/14   Provider, Historicl, MD  iprtropium-lbuterol (DUONEB) 0.5-2.5 (  3) MG/3ML SOLN Inhale 3 mLs into the lungs every 6 (six) hours as needed (sob and wheezing).  01/08/17   [provider]  potassium chloride 20 MEQ TBCR Take one tablet along with lasix, when you need to take lasix. 02/06/17   Bonnielee Haff, MD  predniSONE (DELTASONE) 20 MG tablet Take 2 tablets twice daily for 3 days, then take 2 tablets once daily for 3 days, then take 1 tablet once daily for 3 days and then 1 tablet once daily as before Patient not taking: Reported on 03/27/2018 02/06/17   Bonnielee Haff, MD  tadalafil (ADCIRCA/CIALIS) 20 MG tablet Take 20 mg by mouth daily as  needed for erectile dysfunction.    [provider]  verapamil (CALAN-SR) 120 MG CR tablet Take 1 tablet (120 mg total) by mouth at bedtime. Patient taking differently: Take 120 mg by mouth every morning.  09/23/14   Etta Quill, NP    Physical Exam: Vitals:   03/27/18 0630 03/27/18 0701 03/27/18 0712 03/27/18 0730  BP: 132/74 113/83  (!) 130/48  Pulse: (!) 113 (!) 113 (!) 131 (!) 141  Resp: (!) 34 (!) 31 (!) 38 (!) 38  Temp:      TempSrc:      SpO2: 100% 98% 100% 92%    Constitutional: appears uncomfortable with respiratory effort, obese Vitals:   03/27/18 0630 03/27/18 0701 03/27/18 0712 03/27/18 0730  BP: 132/74 113/83  (!) 130/48  Pulse: (!) 113 (!) 113 (!) 131 (!) 141  Resp: (!) 34 (!) 31 (!) 38 (!) 38  Temp:      TempSrc:      SpO2: 100% 98% 100% 92%   Eyes: PERRL, lids and conjunctivae normal ENMT: Mucous membranes are moist. Posterior pharynx clear of any exudate or lesions.Normal dentition.  Neck: normal, supple, no masses, no thyromegaly Respiratory: increased wob, moving air bilaterally, but difficult exam due to body habitus Cardiovascular: tachycardic, no appreciable mgr  Abdomen: no tenderness, no masses palpated.  Musculoskeletal: no clubbing / cyanosis. No joint deformity upper and lower extremities. Good ROM, no contractures. Normal muscle tone.  Skin: no rashes, lesions, ulcers. No induration Neurologic: CN 2-12 grossly intact. Sensation intact.  Psychiatric: Normal judgment and insight. Alert and oriented x 3. Normal mood.   Labs on Admission: I have personally reviewed following labs and imaging studies  CBC: Recent Labs  Lab 03/27/18 0601  WBC 11.6*  HGB 11.7*  HCT 41.9  MCV 84.8  PLT 026   Basic Metabolic Panel: Recent Labs  Lab 03/27/18 0606  NA 139  K 3.3*  CL 101  CO2 23  GLUCOSE 159*  BUN 9  CREATININE 0.80  CALCIUM 8.7*   GFR: Estimated Creatinine Clearance: 105.4 mL/min (by C-G formula based on SCr of 0.8  mg/dL). Liver Function Tests: Recent Labs  Lab 03/27/18 0606  AST 23  ALT 14  ALKPHOS 54  BILITOT 0.9  PROT 7.3  ALBUMIN 3.6   No results for input(s): LIPASE, AMYLASE in the last 168 hours. No results for input(s): AMMONIA in the last 168 hours. Coagulation Profile: No results for input(s): INR, PROTIME in the last 168 hours. Cardiac Enzymes: No results for input(s): CKTOTAL, CKMB, CKMBINDEX, TROPONINI in the last 168 hours. BNP (last 3 results) No results for input(s): PROBNP in the last 8760 hours. HbA1C: No results for input(s): HGBA1C in the last 72 hours. CBG: No results for input(s): GLUCAP in the last 168 hours. Lipid Profile: No results for input(s): CHOL,  HDL, LDLCLC, TRIG, CHOLHDL, LDLDIRECT in the last 72 hours. Thyroid Function Tests: No results for input(s): TSH, T4TOTL, FREET4, T3FREE, THYROIDB in the last 72 hours. nemia Panel: No results for input(s): VITMINB12, FOLTE, FERRITIN, TIBC, IRON, RETICCTPCT in the last 72 hours. Urine analysis:    Component Value Date/Time   COLORURINE YELLOW 12/24/2008 1604   PPERNCEUR CLOUDY () 12/24/2008 1604   LBSPEC 1.019 12/24/2008 1604   PHURINE 7.5 12/24/2008 1604   GLUCOSEU NEGTIVE 12/24/2008 1604   HGBUR NEGTIVE 12/24/2008 1604   BILIRUBINUR NEGTIVE 12/24/2008 [place] 12/24/2008 1604   PROTEINUR NEGTIVE 12/24/2008 1604   UROBILINOGEN 0.2 12/24/2008 1604   NITRITE NEGTIVE 12/24/2008 1604   LEUKOCYTESUR MODERTE () 12/24/2008 1604    Radiological Exams on dmission: Dg Chest Port 1 View  Result Date: 03/27/2018 CLINICL DT:  Fever and shortness of breath EXM: PORTBLE CHEST 1 VIEW COMPRISON:  02/02/2017 FINDINGS: Chronic lung disease. Chart history of NSIP. Expected low lung volumes. No appreciable change in pulmonary opacity. Stable prominent heart size accentuated by technique. No effusion or air leak. IMPRESSION: Interstitial lung disease. No acute finding when compared to  prior but extensive baseline opacity could easily obscure infection. Electronically Signed   By: Monte Fantasia M.D.   On: 03/27/2018 07:53    EKG: Independently reviewed. Sinus tachycardia.  QT prolongation.  ssessment/Plan ctive Problems:   ILD (interstitial lung disease) (HCC)  cute on Chronic Hypoxic Respiratory Failure  Nonspecific Interstitial Pneumonia:  Will treat with antibiotics and steroids for possible infection/flare as well as lasix. On 10-15 L at home.  CXR without acute finding CT pending Influenza negative RVP pending MRS PCR Cefepime/vanc in ED.  Will continue.  Narrow as able.  Solumedrol, scheduled and prn nebs Lasix 40 BID Blood cx, sputum cx Continue mycophenolate   HFpEF  Pulmonary hypertension: Echo with normal EV with mild LVH, mild RV systolic dysfunction (see care everywhere - 02/28/2018). Follows at [facility] for pulmonary hypertension, on tadalafil.  Considering inhaled prostacyclin at [facility]. Diuresis as noted above  Hypertension: hold HCTZ with diuresis with lasix above.  Continue verapamil and atenolol  Sinus Tachycardia  Hx SVT: due to respiratory issues above, continue to monitor with treatment/diuresis.  Continue V nodal blockers.  T2DM: hold metformin, SSI  Hypokalemia: replace, follow  QT prolongation: follow mag, caution with qt prolonging meds  DVT prophylaxis: lovenox  Code Status: full, discussed with pt - addendum: on further discussion with pulm, pt does not want vent.  Will change to partial code.  No intubation.  CPR ok.  Would continue to discuss. Family Communication: sister at bedside  Disposition Plan: pending improvement  Consults called: pulmonology  dmission status: inpatient given acute on chornic hypoxic resp failure   45 min critical care time.  Pt with acute on chronic hypoxic resp failure with tachypnea, tachycardia.  Fayrene Helper MD [facility] Pager [facility]  If 7PM-7M, please contact  night-coverage [email or web address] Password [facility]  03/27/2018, 8:37 M

## 2018-03-27 NOTE — Progress Notes (Signed)
A consult was received from an ED physician for vancomycin per pharmacy dosing.  The patient's profile has been reviewed for ht/wt/allergies/indication/available labs.   A one time order has been placed for vancomycin 1500 mg iv once.  Further antibiotics/pharmacy consults should be ordered by admitting physician if indicated.                       Thank you, Felicia [name] Form 03/27/2018  8:09 AM

## 2018-03-27 NOTE — ED Triage Notes (Signed)
Patient here from home with complaints of SOB. Hx of lung disease, reports that only cure is lung transplant, being seen at [facility]. Reports that she normally wears O2 between 10-15L at home.

## 2018-03-27 NOTE — ED Notes (Signed)
Patient transported to CT 

## 2018-03-27 NOTE — ED Notes (Signed)
Lisette Grinder, RN aware of vital signs charted @ 0701.

## 2018-03-27 NOTE — ED Notes (Addendum)
Taquita, RN aware of previously charted vital signs @ 0630.

## 2018-03-27 NOTE — ED Provider Notes (Signed)
[facility] [place] DEPT Provider Note   CSN: [id number] Arrival date & time: 03/27/18  0540  Time seen 5:56 AM  History   Chief Complaint Chief Complaint  Patient presents with  . Shortness of Breath    HPI Felicia [name] is a 48 y.o. female.     HPI patient presents via EMS for shortness of breath.  She states she had a sinus infection 2 to 3 months ago and was treated with antibiotics.  She states it got better but is come back.  She also describes getting short of breath 5 days ago with coughing with clear mucus production, wheezing without history of reactive airway disease.  She has had chills without documented fever.  She has a mild sore throat.  She has had nausea without vomiting and has had diarrhea about 5 times a day for the past 2 days.  Diarrhea is loose and watery.  She complains of having lots of body aches.  Nobody else is been sick.  Patient states she did take the flu shot this year.  She states her shortness of breath has been getting worse the past couple days especially at night.  PCP Rankins, Bill Salinas, MD   Past Medical History:  Diagnosis Date  . Diabetes mellitus without complication ([place])    type 2   . Hypertension   . NSIP (nonspecific interstitial pneumonitis) ([place])   . SVT (supraventricular tachycardia) [facility])     Patient Active Problem List   Diagnosis Date Noted  . Palpitations 03/20/2018  . Chronic diastolic HF (heart failure) ([place]) 03/21/2017  . Pulmonary HTN ([place]) 03/21/2017  . Medication management 03/21/2017  . NSIP (nonspecific interstitial pneumonitis) ([place]) 02/03/2017  . Acute on chronic respiratory failure with hypoxia ([place]) 02/03/2017  . Essential hypertension 02/03/2017  . Dyspnea on exertion   . On home oxygen therapy   . PSVT (paroxysmal supraventricular tachycardia) ([place]) 09/23/2014  . Interstitial pneumonitis ([place]) 09/23/2014  . UNSPEC ALVEOLAR&PARIETOALVEOLAR PNEUMONOPATHY 09/12/2008  .  HYPERTENSION 09/11/2008  . BRONCHITIS 09/11/2008    Past Surgical History:  Procedure Laterality Date  . CESAREAN SECTION    . LUNG BIOPSY       OB History   No obstetric history on file.      Home Medications    Prior to Admission medications   Medication Sig Start Date End Date Taking? Authorizing Provider  albuterol (PROVENTIL HFA;VENTOLIN HFA) 108 (90 Base) MCG/ACT inhaler Inhale 1 puff into the lungs every 6 (six) hours as needed for wheezing or shortness of breath.    [provider]  atenolol (TENORMIN) 50 MG tablet Take 75 mg by mouth 2 (two) times daily.  01/08/17   [provider]  furosemide (LASIX) 40 MG tablet Take one tablet (58m) for weight gain of 2 lbs 02/06/17   KBonnielee Haff MD  guaiFENesin (MUCINEX) 600 MG 12 hr tablet Take 600 mg by mouth daily as needed for to loosen phlegm.    [provider]  hydrochlorothiazide (HYDRODIURIL) 25 MG tablet Take 25 mg by mouth daily. 06/30/14   [provider]  ipratropium-albuterol (DUONEB) 0.5-2.5 (3) MG/3ML SOLN Inhale 3 mLs into the lungs every 6 (six) hours as needed (sob and wheezing).  01/08/17   [provider]  metFORMIN (GLUCOPHAGE) 500 MG tablet Take 500 mg by mouth 2 (two) times daily with a meal.     [provider]  mycophenolate (CELLCEPT) 500 MG tablet Take 1,500 mg by mouth 2 (two) times  daily. 3 tablets (1500 mg) bid    [provider]  potassium chloride 20 MEQ TBCR Take one tablet along with lasix, when you need to take lasix. 02/06/17   Bonnielee Haff, MD  predniSONE (DELTASONE) 10 MG tablet Take 10 mg by mouth daily with breakfast.    [provider]  predniSONE (DELTASONE) 20 MG tablet Take 2 tablets twice daily for 3 days, then take 2 tablets once daily for 3 days, then take 1 tablet once daily for 3 days and then 1 tablet once daily as before 02/06/17   Bonnielee Haff, MD  tadalafil (ADCIRCA/CIALIS) 20 MG tablet Take 20 mg by mouth  daily as needed for erectile dysfunction.    [provider]  verapamil (CALAN-SR) 120 MG CR tablet Take 1 tablet (120 mg total) by mouth at bedtime. Patient taking differently: Take 120 mg by mouth every morning.  09/23/14   Etta Quill, NP    Family History Family History  Problem Relation Age of Onset  . Hypertension Mother   . Cancer Father   . Diabetes Father   . Hypertension Father   . Diabetes Sister   . Mental retardation Sister   . Cancer Maternal Grandmother   . Cancer Maternal Grandfather   . Cancer Paternal Grandmother   . Cancer Paternal Grandfather     Social History Social History   Tobacco Use  . Smoking status: Never Smoker  . Smokeless tobacco: Never Used  . Tobacco comment: never smoked or used tobacco products  Substance Use Topics  . Alcohol use: No  . Drug use: No     Allergies   Patient has no known allergies.   Review of Systems Review of Systems  All other systems reviewed and are negative.    Physical Exam Updated Vital Signs BP (!) 130/48   Pulse (!) 141   Temp 98.4 F (36.9 C) (Oral)   Resp (!) 38   SpO2 92%   Physical Exam Vitals signs and nursing note reviewed.  Constitutional:      General: She is in acute distress.     Appearance: She is well-developed.  HENT:     Head: Normocephalic and atraumatic.     Right Ear: External ear normal.     Left Ear: External ear normal.     Nose:     Comments: Patient has dried secretions around the openings of her nares bilaterally    Mouth/Throat:     Mouth: Mucous membranes are moist.     Pharynx: No oropharyngeal exudate or posterior oropharyngeal erythema.  Eyes:     Extraocular Movements: Extraocular movements intact.     Conjunctiva/sclera: Conjunctivae normal.     Pupils: Pupils are equal, round, and reactive to light.  Neck:     Musculoskeletal: Normal range of motion.  Cardiovascular:     Rate and Rhythm: Regular rhythm. Tachycardia present.  Pulmonary:      Effort: Tachypnea, accessory muscle usage, prolonged expiration, respiratory distress and retractions present.     Breath sounds: Decreased breath sounds present. No wheezing, rhonchi or rales.  Musculoskeletal: Normal range of motion.        General: No swelling.  Skin:    General: Skin is warm and dry.     Findings: No erythema.  Neurological:     General: No focal deficit present.     Mental Status: She is alert and oriented to person, place, and time.     Cranial Nerves: No cranial nerve  deficit.  Psychiatric:        Mood and Affect: Mood normal.        Behavior: Behavior normal.        Thought Content: Thought content normal.      ED Treatments / Results  Labs (all labs ordered are listed, but only abnormal results are displayed) Results for orders placed or performed during the hospital encounter of 03/27/18  CBC  Result Value Ref Range   WBC 11.6 (H) 4.0 - 10.5 K/uL   RBC 4.94 3.87 - 5.11 MIL/uL   Hemoglobin 11.7 (L) 12.0 - 15.0 g/dL   HCT 41.9 36.0 - 46.0 %   MCV 84.8 80.0 - 100.0 fL   MCH 23.7 (L) 26.0 - 34.0 pg   MCHC 27.9 (L) 30.0 - 36.0 g/dL   RDW 18.0 (H) 11.5 - 15.5 %   Platelets 314 150 - 400 K/uL   nRBC 0.2 0.0 - 0.2 %  Comprehensive metabolic panel  Result Value Ref Range   Sodium 139 135 - 145 mmol/L   Potassium 3.3 (L) 3.5 - 5.1 mmol/L   Chloride 101 98 - 111 mmol/L   CO2 23 22 - 32 mmol/L   Glucose, Bld 159 (H) 70 - 99 mg/dL   BUN 9 6 - 20 mg/dL   Creatinine, Ser 0.80 0.44 - 1.00 mg/dL   Calcium 8.7 (L) 8.9 - 10.3 mg/dL   Total Protein 7.3 6.5 - 8.1 g/dL   Albumin 3.6 3.5 - 5.0 g/dL   AST 23 15 - 41 U/L   ALT 14 0 - 44 U/L   Alkaline Phosphatase 54 38 - 126 U/L   Total Bilirubin 0.9 0.3 - 1.2 mg/dL   GFR calc non Af Amer >60 >60 mL/min   GFR calc Af Amer >60 >60 mL/min   Anion gap 15 5 - 15  Brain natriuretic peptide  Result Value Ref Range   B Natriuretic Peptide 456.0 (H) 0.0 - 100.0 pg/mL  Influenza panel by PCR (type A & B)  Result  Value Ref Range   Influenza A By PCR NEGATIVE NEGATIVE   Influenza B By PCR NEGATIVE NEGATIVE  I-stat troponin, ED  Result Value Ref Range   Troponin i, poc 0.02 0.00 - 0.08 ng/mL   Comment 3           Laboratory interpretation all normal except leukocytosis, mild anemia, mild hypokalemia, mild elevation of in the    EKG None   ED ECG REPORT   Date: 03/27/2018  Rate: 105  Rhythm: sinus tachycardia  QRS Axis: left  Intervals: normal and QT prolonged  ST/T Wave abnormalities: nonspecific ST/T changes  Conduction Disutrbances:low voltage  Narrative Interpretation:   Old EKG Reviewed: none available  I have personally reviewed the EKG tracing and agree with the computerized printout as noted.   Radiology Dg Chest Port 1 View  Result Date: 03/27/2018 CLINICAL DATA:  Fever and shortness of breath EXAM: PORTABLE CHEST 1 VIEW COMPARISON:  02/02/2017 FINDINGS: Chronic lung disease. Chart history of NSIP. Expected low lung volumes. No appreciable change in pulmonary opacity. Stable prominent heart size accentuated by technique. No effusion or air leak. IMPRESSION: Interstitial lung disease. No acute finding when compared to prior but extensive baseline opacity could easily obscure infection. Electronically Signed   By: Monte Fantasia M.D.   On: 03/27/2018 07:53    Procedures .Critical Care Performed by: Rolland Porter, MD Authorized by: Rolland Porter, MD   Critical care provider statement:  Critical care time (minutes):  34   Critical care was necessary to treat or prevent imminent or life-threatening deterioration of the following conditions:  Respiratory failure   Critical care was time spent personally by me on the following activities:  Discussions with consultants, evaluation of patient's response to treatment, examination of patient, obtaining history from patient or surrogate, ordering and review of laboratory studies, ordering and review of radiographic studies, pulse oximetry,  re-evaluation of patient's condition and review of old charts   (including critical care time)  Medications Ordered in ED Medications  ceFEPIme (MAXIPIME) 1 g in sodium chloride 0.9 % 100 mL IVPB (has no administration in time range)  albuterol (PROVENTIL,VENTOLIN) solution continuous neb (10 mg/hr Nebulization Given 03/27/18 1829)  methylPREDNISolone sodium succinate (SOLU-MEDROL) 125 mg/2 mL injection 125 mg (125 mg Intravenous Given 03/27/18 0735)  furosemide (LASIX) injection 60 mg (60 mg Intravenous Given 03/27/18 0750)  potassium chloride SA (K-DUR,KLOR-CON) CR tablet 40 mEq (40 mEq Oral Given 03/27/18 0748)     Initial Impression / Assessment and Plan / ED Course  I have reviewed the triage vital signs and the nursing notes.  Pertinent labs & imaging results that were available during my care of the patient were reviewed by me and considered in my medical decision making (see chart for details).      Patient's pulse ox was 79% on room air however she is normally on oxygen.  Patient was given a continuous nebulizer.  She was tested for influenza.  Patient's influenza is negative, she was given IV steroids.  I reviewed patient's chart and she has a history of congestive heart failure, pulmonary hypertension, nonspecific interstitial pneumonia and is chronically short of breath and on home oxygen at 10 to 15 L/min.  She recently talked to her pulmonologist at [facility] and they are going to start her on rituximab therapy.   Recheck at 7:55 AM patient states she is feeling maybe a little bit better.  Her respiratory rates in the 40s, her pulse ox 100%.  She states the nebulizer made her worse and that it made her cough more.  Patient still appears to be very tachypneic.  We discussed admission and she is agreeable.  Family is there and agrees that she is much more short of breath than at her baseline.  Patient's chest x-ray is resulted and is I had already discussed with the patient she has  diffuse lung disease which could hide a underlying pneumonia.  She was placed on antibiotics empirically.  She was admitted in December and discharged December 31.  At that time she was treated with steroids and Augmentin.  She was treated in the hospital with Zithromax and Rocephin.  8:10 AM Dr. Florene Glen, hospitalist will admit.  Final Clinical Impressions(s) / ED Diagnoses   Final diagnoses:  Shortness of breath  Nonspecific interstitial pneumonia [facility])    Plan admission  Rolland Porter, MD, Barbette Or, MD 03/27/18 [phone]

## 2018-03-28 LAB — GLUCOSE, CAPILLARY
GLUCOSE-CAPILLARY: 205 mg/dL — AB (ref 70–99)
Glucose-Capillary: 208 mg/dL — ABNORMAL HIGH (ref 70–99)
Glucose-Capillary: 212 mg/dL — ABNORMAL HIGH (ref 70–99)
Glucose-Capillary: 183 mg/dL — ABNORMAL HIGH (ref 70–99)
Glucose-Capillary: 216 mg/dL — ABNORMAL HIGH (ref 70–99)

## 2018-03-28 LAB — COMPREHENSIVE METABOLIC PANEL
ALT: 16 U/L (ref 0–44)
AST: 22 U/L (ref 15–41)
Albumin: 3.6 g/dL (ref 3.5–5.0)
Alkaline Phosphatase: 52 U/L (ref 38–126)
Anion gap: 15 (ref 5–15)
BILIRUBIN TOTAL: 1.2 mg/dL (ref 0.3–1.2)
BUN: 13 mg/dL (ref 6–20)
CO2: 24 mmol/L (ref 22–32)
Calcium: 8.5 mg/dL — ABNORMAL LOW (ref 8.9–10.3)
Chloride: 101 mmol/L (ref 98–111)
Creatinine, Ser: 0.97 mg/dL (ref 0.44–1.00)
GFR calc Af Amer: >60 mL/min (ref >60)
GFR calc non Af Amer: >60 mL/min (ref >60)
Glucose, Bld: 205 mg/dL — ABNORMAL HIGH (ref 70–99)
Potassium: 3.6 mmol/L (ref 3.5–5.1)
Sodium: 140 mmol/L (ref 135–145)
Total Protein: 7.5 g/dL (ref 6.5–8.1)

## 2018-03-28 LAB — CBC
HCT: 41 % (ref 36.0–46.0)
Hemoglobin: 11.5 g/dL — ABNORMAL LOW (ref 12.0–15.0)
MCH: 23.7 pg — ABNORMAL LOW (ref 26.0–34.0)
MCHC: 28 g/dL — AB (ref 30.0–36.0)
MCV: 84.5 fL (ref 80.0–100.0)
Platelets: 329 10*3/uL (ref 150–400)
RBC: 4.85 MIL/uL (ref 3.87–5.11)
RDW: 17.8 % — ABNORMAL HIGH (ref 11.5–15.5)
WBC: 7.9 10*3/uL (ref 4.0–10.5)
nRBC: 0 % (ref 0.0–0.2)

## 2018-03-28 LAB — HIV ANTIBODY (ROUTINE TESTING W REFLEX): HIV Screen 4th Generation wRfx: NONREACTIVE

## 2018-03-28 MED ORDER — POTASSIUM CHLORIDE CRYS ER 20 MEQ PO TBCR
40.0000 meq | EXTENDED_RELEASE_TABLET | Freq: Once | ORAL | Status: AC
  Administered 2018-03-28: 40 meq via ORAL
  Filled 2018-03-28: qty 2

## 2018-03-28 MED ORDER — GUAIFENESIN ER 600 MG PO TB12
600.0000 mg | ORAL_TABLET | Freq: Two times a day (BID) | ORAL | Status: DC | PRN
  Administered 2018-03-28 – 2018-03-31 (×5): 600 mg via ORAL
  Filled 2018-03-28 (×5): qty 1

## 2018-03-28 MED ORDER — INSULIN GLARGINE 100 UNIT/ML ~~LOC~~ SOLN
10.0000 [IU] | Freq: Every day | SUBCUTANEOUS | Status: DC
  Administered 2018-03-28 – 2018-03-31 (×4): 10 [IU] via SUBCUTANEOUS
  Filled 2018-03-28 (×4): qty 0.1

## 2018-03-28 NOTE — Progress Notes (Signed)
NAME:  Felicia [name], MRN:  [id number], DOB:  [birth year], LOS: 1 ADMISSION DATE:  03/27/2018, CONSULTATION DATE:  03/27/2018  REFERRING MD:  Dr Florene Glen, CHIEF COMPLAINT: Shortness of breath  Brief History   Patient presented with shortness of breath of 4-5 days duration Recent URI symptoms Background history of NSIP She recently had 2 courses of rituximab-was discontinued Has been on CellCept Has pulmonary hypertension Diastolic heart failure On very high flow oxygen at baseline with increased requirement in the last few days  Past Medical History  Diabetes type 2, NSIP, hypertension, SVT, pulmonary hypertension  Pulmonary hypertension  Significant Hospital Events   2/18 Shortness of breath, tachypnea, on a nonrebreather at present.  Placed on broad-spectrum antibiotics, IV diuresis, high flow oxygen, and systemic steroids 2/19: Feeling some better.  -1.2 L with diuresis.  Transitioning to high flow from nonrebreather  Consults:  PCCM 03/27/2018  Procedures:    Significant Diagnostic Tests:  Chest x-ray with bibasal haziness, cardiomegaly Echocardiogram at [facility] December 2018 the RV is severely enlarged and hypokinetic artery moderately enlarged RVSP of 70 with dilated IVC normal LV. An echo in October 2016 showed normal LV with mild LVH normal left atrial size RV then was mildly enlarged but had normal function and RVSP of 27 Pulmonary function test January 2019 FVC 1.25 L 36% predicted, FEV1 1.18 L 40% predicted with DLCO of 17  Micro Data:  Influenza a and B by PCR negative Viral panel pending Antimicrobials:  Received vancomycin and cefepime Vancomycin 2/18>>> Cefepime 2/18>>>  Interim history/subjective:  She feels better.  Oxygen requirements seem to have improved.  Intake/output balance -1.2 L currently Objective   Blood pressure (Abnormal) 146/84, pulse 72, temperature (Abnormal) 97.5 F (36.4 C), temperature source Axillary, resp. rate  (Abnormal) 21, height 5' 5" (1.651 m), weight 104.5 kg, SpO2 100 %.    FiO2 (%):  [40 %] 40 %   Intake/Output Summary (Last 24 hours) at 03/28/2018 4315 Last data filed at 03/28/2018 0315 Gross per 24 hour  Intake 714.06 ml  Output 2000 ml  Net -1285.94 ml   Filed Weights   03/27/18 1018 03/28/18 0628  Weight: 108.8 kg 104.5 kg    Examination: General: This is a pleasant 48 year old female she is currently resting in bed she is in no acute distress however remains on high flow oxygen, and desaturates with minimal activity HEENT normocephalic atraumatic mucous membranes are moist no discernible jugular venous distention Pulmonary: Basilar rales, some scattered rhonchi, no accessory use while at rest Cardiac: Regular rate and rhythm Abdomen: Not tender no organomegaly positive bowel sounds Extremities: Warm and dry brisk capillary refill no significant edema Neuro: Awake oriented no focal deficits    Resolved Hospital Problem list     Assessment & Plan:  Acute on chronic respiratory failure 2/2 Secondary to exacerbation of NSIP versus lower respiratory infection -also suspect element of volume excess.  -CT chest reviewed.  No significant change when comparing prior films. -Urine Streptococcus antigen was negative, Legionella antigen remains pending Influenza panel was negative as was respiratory viral panel Plan/recommendation Continuing supplemental oxygen, trying to transition to high flow oxygen from nonrebreather this a.m. Continue nocturnal and as needed BiPAP, I wonder if she might benefit from this at home Continue IV diuresis as long as her blood pressure, BUN, and creatinine can tolerate it Follow-up culture data Antibiotic day #2 Continue current Solu-Medrol dosing   History of diastolic heart failure w/ evidence of acute on chronic biventricular heart  failure (decompensated cor pulmonale and diastolic dysfxn) -Last echo was in 2018 Plan F/u  ECHO Tele Lasix->aiming for neg volume status  History of supraventricular tachycardia Plan Cont BB  Cont tele  Pulmonary hypertension -Pulmonary hypertension is severe -Has been tolerating tadalafil Plan F/u echo; decide on Tyvaso    Diabetes Plan SSI      Best practice:  Diet: heart healthy Pain/Anxiety/Delirium protocol (if indicated): As needed VAP protocol (if indicated): n/a DVT prophylaxis: Lovenox GI prophylaxis: Protonix Glucose control: ssi Mobility: Bedrest Code Status: Full Family Communication: sister at bedside Disposition: ICU   Erick Colace ACNP-BC [place] Pager # [phone] OR # [phone] if no answer

## 2018-03-28 NOTE — Progress Notes (Signed)
Inpatient Diabetes Program Recommendations  AACE/ADA: New Consensus Statement on Inpatient Glycemic Control (2015)  Target Ranges:  Prepandial:   less than 140 mg/dL      Peak postprandial:   less than 180 mg/dL (1-2 hours)      Critically ill patients:  140 - 180 mg/dL   Lab Results  Component Value Date   GLUCAP 208 (H) 03/28/2018   HGBA1C 6.3 (H) 02/03/2017    Review of Glycemic Control  Diabetes history: DM2 Outpatient Diabetes medications: metformin 500 mg bid Current orders for Inpatient glycemic control: Novolog 0-9 units tidwc and hs  Hgba1C - 6.3% - good glycemic control On Solumedrol 80 mg Q6H Glucose 205, 208 this am.  Inpatient Diabetes Program Recommendations:     Add Lantus 10 units Q24H Add CHO mod med to heart healthy diet May need meal coverage insulin while on steroids - Novolog 3 units tid  Will continue to follow.   Thank you. Lorenda Peck, RD, LDN, CDE Inpatient Diabetes Coordinator [phone]

## 2018-03-28 NOTE — Progress Notes (Signed)
PROGRESS NOTE    Felicia [name]  UXN:[id number] DOB: March 25, 1970 DOA: 03/27/2018 PCP: Aretta Nip, MD   Chief complaint: Shortness of breath   Brief Narrative:  Felicia [name] is a 48 y.o. female with medical history significant of NSIP on 10-15 L at home, pulmonary hypertension, HFpEF, hypertension, T2DM, SVT presents with worsening shortness of breath.   She notes that her symptoms started about 3 weeks ago when she had a sinus infection.  Treated with azithro and doxy.  She states that she has been on not return to her baseline yet.  Last 5 days things have gotten progressively worse.  She is noticed progressive shortness of breath, cough with mucus, and fatigue.  She notes subjective fevers and chills.  She notes chest pain only with her cough.  She notes nausea with the cough.  She denies any abdominal pain or vomiting.  She uses 10 L at home typically, but recently she has been adding an additional 5 L with a second nasal cannula.  She denies smoking or drinking.  She denies sick contacts, but did see the cardiologist last week and the pulmonologist within the past month.    ED Course: Labs, CXR, steroids, abx, lasix.  Admit for acute on chronic hypoxic respiratory failure.   Assessment & Plan:   Principal Problem:   ILD (interstitial lung disease) ([facility]) Active Problems:   PSVT (paroxysmal supraventricular tachycardia) ([facility])   Essential hypertension   On home oxygen therapy   Chronic diastolic HF (heart failure) (HCC)   Pulmonary HTN ([facility])   Acute on chronic hypoxic respiratory failure Hx nonspecific interstitial lung disease Patient presenting with progressive shortness of breath, history of nonspecific interstitial lung disease on home oxygen 10-15 L/min at baseline.  Follows with [facility] for her underlying pulmonary hypertension.  Started on broad-spectrum antibiotics with vancomycin and cefepime initially for concern of infectious etiology; as well as  IV steroids.  CT pulmonary angiogram negative for pulmonary embolism but notable for severe pulmonary hypertension with alveolar opacity consistent with fibrosis with multiple enlarged lymph nodes that is stable since previous exams. --Pulmonology following, appreciate assistance --Discontinue vancomycin today for negative MRSA PCR --HIV nonreactive, strep pneumo antigen negative, rapid influenza negative. --BNP 416 --Legionella antigen: Pending --Continue antibiotics with cefepime --Solu-Medrol 60 mg IV every 6 hours --Lasix 40 mg IV twice daily --DuoNebs, albuterol as needed --Continue BiPAP prn; may benefit from home use per pulmonary --Strict I's and O's, daily weights  HFpEF Severe Pulmonary Hypertension:  Echo normal EF with mild LVH, mild RV systolic dysfunction (see care everywhere - 02/28/2018). PFT's Jan 2019 w/ FVC 1.25 L 36% predicted, FEV1 1.18 L 40% predicted with DLCO of 17. Follows at [facility] for pulmonary hypertension, on tadalafil.  Considering inhaled prostacyclin at [facility]. --Continue diuresis with furosemide 40 mg IV twice daily --Continue tadalafil 4m PO BID --Continue mycophenolate 15019mPO BID --Pulmonology considering starting Tyvaso --Continue to monitor strict I's and O's --Continue to monitor renal function daily in setting of IV diuresis  Hypertension: Holding home HCTZ with diuresis with lasix above.   --Continue verapamil and atenolol  Sinus Tachycardia Hx SVT: due to respiratory issues above, continue to monitor with treatment/diuresis.   --Continue AV nodal blockers with verapamil and atenolol  T2DM:  Hemoglobin A1c 6.3 on 02/03/2017.  Holding home metformin.  Glucose elevated likely secondary to side effect of IV steroids.  Continue to hold home metformin. --Carbohydrate consistent diet --Starting Lantus 10 units subcutaneously daily --Continue insulin sliding scale  for coverage --Continue monitor glucose closely.  Hypokalemia: replace, and  continue to monitor daily with magnesium especially in the setting of IV diuresis  QT prolongation: follow mag, caution with qt prolonging meds    DVT prophylaxis: Lovenox Code Status: Partial, no intubation; otherwise no other exclusions Family Communication: None Disposition Plan: Inpatient level of care in the stepdown unit   Consultants:   Pulmonology  Procedures:  none  Antimicrobials:   Vancomycin 2/18-2/19  Cefepime 2/18-   Subjective: Patient seen and examined at bedside, resting comfortably on nonrebreather mask at 15 L/min.  Recently titrated off of BiPAP earlier this morning.  States breathing is slightly improved but continues with severe dyspnea.  Respiratory/pulmonology attempting high flow nasal cannula later this morning.  No other complaints at this time.  Objective: Vitals:   03/28/18 0700 03/28/18 0800 03/28/18 0853 03/28/18 0900  BP: (!) 154/93 (!) 146/84  (!) 146/84  Pulse: 62 72  93  Resp: (!) 31 (!) 21  (!) 29  Temp:      TempSrc:      SpO2: 100% 100% 100% 100%  Weight:      Height:        Intake/Output Summary (Last 24 hours) at 03/28/2018 1136 Last data filed at 03/28/2018 1497 Gross per 24 hour  Intake 814.12 ml  Output 2000 ml  Net -1185.88 ml   Filed Weights   03/27/18 1018 03/28/18 0628  Weight: 108.8 kg 104.5 kg    Examination:  General exam: Appears calm and comfortable, continues on nonrebreather Respiratory system: Coarse breath sounds bilaterally, slightly decreased bases, no crackles, normal respiratory effort on 15 L NRB Cardiovascular system: S1 & S2 heard, RRR. No JVD, murmurs, rubs, gallops or clicks. No pedal edema. Gastrointestinal system: Abdomen protuberant, nondistended, soft and nontender. No organomegaly or masses felt. Normal bowel sounds heard. Central nervous system: Alert and oriented. No focal neurological deficits. Extremities: Symmetric 5 x 5 power. Skin: No rashes, lesions or ulcers Psychiatry:  Judgement and insight appear normal. Mood & affect appropriate.     Data Reviewed: I have personally reviewed following labs and imaging studies  CBC: Recent Labs  Lab 03/27/18 0601 03/28/18 0328  WBC 11.6* 7.9  HGB 11.7* 11.5*  HCT 41.9 41.0  MCV 84.8 84.5  PLT 314 026   Basic Metabolic Panel: Recent Labs  Lab 03/27/18 0606 03/28/18 0328  NA 139 140  K 3.3* 3.6  CL 101 101  CO2 23 24  GLUCOSE 159* 205*  BUN 9 13  CREATININE 0.80 0.97  CALCIUM 8.7* 8.5*  MG 1.7  --    GFR: Estimated Creatinine Clearance: 85.1 mL/min (by C-G formula based on SCr of 0.97 mg/dL). Liver Function Tests: Recent Labs  Lab 03/27/18 0606 03/28/18 0328  AST 23 22  ALT 14 16  ALKPHOS 54 52  BILITOT 0.9 1.2  PROT 7.3 7.5  ALBUMIN 3.6 3.6   No results for input(s): LIPASE, AMYLASE in the last 168 hours. No results for input(s): AMMONIA in the last 168 hours. Coagulation Profile: No results for input(s): INR, PROTIME in the last 168 hours. Cardiac Enzymes: No results for input(s): CKTOTAL, CKMB, CKMBINDEX, TROPONINI in the last 168 hours. BNP (last 3 results) No results for input(s): PROBNP in the last 8760 hours. HbA1C: No results for input(s): HGBA1C in the last 72 hours. CBG: Recent Labs  Lab 03/27/18 2107 03/27/18 2347 03/28/18 0409 03/28/18 0830 03/28/18 1134  GLUCAP 186* 204* 205* 208* 212*   Lipid Profile:  No results for input(s): CHOL, HDL, LDLCALC, TRIG, CHOLHDL, LDLDIRECT in the last 72 hours. Thyroid Function Tests: No results for input(s): TSH, T4TOTAL, FREET4, T3FREE, THYROIDAB in the last 72 hours. Anemia Panel: No results for input(s): VITAMINB12, FOLATE, FERRITIN, TIBC, IRON, RETICCTPCT in the last 72 hours. Sepsis Labs: No results for input(s): PROCALCITON, LATICACIDVEN in the last 168 hours.  Recent Results (from the past 240 hour(s))  Respiratory Panel by PCR     Status: None   Collection Time: 03/27/18  6:06 AM  Result Value Ref Range Status    Adenovirus NOT DETECTED NOT DETECTED Final   Coronavirus 229E NOT DETECTED NOT DETECTED Final    Comment: (NOTE) The Coronavirus on the Respiratory Panel, DOES NOT test for the novel  Coronavirus (2019 nCoV)    Coronavirus HKU1 NOT DETECTED NOT DETECTED Final   Coronavirus NL63 NOT DETECTED NOT DETECTED Final   Coronavirus OC43 NOT DETECTED NOT DETECTED Final   Metapneumovirus NOT DETECTED NOT DETECTED Final   Rhinovirus / Enterovirus NOT DETECTED NOT DETECTED Final   Influenza A NOT DETECTED NOT DETECTED Final   Influenza B NOT DETECTED NOT DETECTED Final   Parainfluenza Virus 1 NOT DETECTED NOT DETECTED Final   Parainfluenza Virus 2 NOT DETECTED NOT DETECTED Final   Parainfluenza Virus 3 NOT DETECTED NOT DETECTED Final   Parainfluenza Virus 4 NOT DETECTED NOT DETECTED Final   Respiratory Syncytial Virus NOT DETECTED NOT DETECTED Final   Bordetella pertussis NOT DETECTED NOT DETECTED Final   Chlamydophila pneumoniae NOT DETECTED NOT DETECTED Final   Mycoplasma pneumoniae NOT DETECTED NOT DETECTED Final    Comment: Performed at [facility], [address] [place], [place] [address]  Culture, blood (routine x 2)     Status: None (Preliminary result)   Collection Time: 03/27/18  9:14 AM  Result Value Ref Range Status   Specimen Description   Final    BLOOD LEFT ANTECUBITAL Performed at [facility], [place] [address], [place], [place] [address]    Special Requests   Final    BOTTLES DRAWN AEROBIC AND ANAEROBIC Blood Culture adequate volume Performed at [facility] [address] [place], [place] [address]    Culture PENDING  Incomplete   Report Status PENDING  Incomplete  MRSA PCR Screening     Status: None   Collection Time: 03/27/18 10:29 AM  Result Value Ref Range Status   MRSA by PCR NEGATIVE NEGATIVE Final    Comment:        The GeneXpert MRSA Assay (FDA approved for NASAL specimens only), is one component of a comprehensive MRSA  colonization surveillance program. It is not intended to diagnose MRSA infection nor to guide or monitor treatment for MRSA infections. Performed at [facility], [place] [address] [place], [place] [address]          Radiology Studies: Ct Angio Chest Pe W Or Wo Contrast  Result Date: 03/27/2018 CLINICAL DATA:  Shortness of breath EXAM: CT ANGIOGRAPHY CHEST WITH CONTRAST TECHNIQUE: Multidetector CT imaging of the chest was performed using the standard protocol during bolus administration of intravenous contrast. Multiplanar CT image reconstructions and MIPs were obtained to evaluate the vascular anatomy. CONTRAST:  78m ISOVUE-370 IOPAMIDOL (ISOVUE-370) INJECTION 76% COMPARISON:  Chest CT February 03, 2017 and chest radiograph March 27, 2018 FINDINGS: Cardiovascular: There is no demonstrable pulmonary embolus. There is no thoracic aortic aneurysm or dissection. Visualized great vessels appear normal. There is no pericardial effusion  or pericardial thickening. Heart remains enlarged. There is prominence of the main pulmonary outflow tract measuring 3.5 cm. Mediastinum/Nodes: Thyroid appears unremarkable. There are enlarged lymph nodes to the left of the aortic arch. The largest of these lymph nodes measures 2.4 x 1.9 cm. A nearby lymph node in this area measures 1.8 x 1.5 cm. There is an aortopulmonary window lymph node measuring 1.8 x 1.2 cm. There is a lymph node in the right hilum measuring 2.4 x 1.4 cm. There is a subcarinal lymph node measuring 1.3 x 1.3 cm. No esophageal lesions are evident. Lungs/Pleura: There is widespread alveolar opacity throughout the lungs, similar to previous study from 2018. There are areas of underlying fibrosis with scattered bullae throughout the lungs. There is a slight degree of subpleural sparing. The overall appearance is similar to the prior study from 2018. No pleural effusion noted. Upper Abdomen: There is reflux of contrast into the  inferior vena cava and hepatic veins. Visualized upper abdominal structures otherwise appear unremarkable. Musculoskeletal: There are no blastic or lytic bone lesions. No chest wall lesions are evident. Review of the MIP images confirms the above findings. IMPRESSION: 1. No demonstrable pulmonary embolus. No thoracic aortic aneurysm or dissection. 2. Prominence of the main pulmonary outflow tract, a finding indicative of pulmonary arterial hypertension. 3. Widespread alveolar opacity with underlying fibrosis and relative subpleural sparing. This appearance is similar to the prior study from December 2018 and is consistent with reported diagnosis of nonspecific interstitial pneumonitis. No evident new opacity. 4. Multiple enlarged lymph nodes, a finding also present on previous study. Question reactive adenopathy given the extensive abnormality in the lung parenchyma. 5. Reflux of contrast into the inferior vena cava and hepatic veins, a finding likely indicative of a degree of increase in right heart pressure. Electronically Signed   By: Lowella Grip III M.D.   On: 03/27/2018 09:55   Dg Chest Port 1 View  Result Date: 03/27/2018 CLINICAL DATA:  Fever and shortness of breath EXAM: PORTABLE CHEST 1 VIEW COMPARISON:  02/02/2017 FINDINGS: Chronic lung disease. Chart history of NSIP. Expected low lung volumes. No appreciable change in pulmonary opacity. Stable prominent heart size accentuated by technique. No effusion or air leak. IMPRESSION: Interstitial lung disease. No acute finding when compared to prior but extensive baseline opacity could easily obscure infection. Electronically Signed   By: Monte Fantasia M.D.   On: 03/27/2018 07:53        Scheduled Meds: . atenolol  75 mg Oral BID  . enoxaparin (LOVENOX) injection  40 mg Subcutaneous Q24H  . furosemide  40 mg Intravenous BID  . insulin aspart  0-5 Units Subcutaneous QHS  . insulin aspart  0-9 Units Subcutaneous TID WC  . insulin glargine   10 Units Subcutaneous Daily  . ipratropium-albuterol  3 mL Nebulization Q6H  . mouth rinse  15 mL Mouth Rinse BID  . methylPREDNISolone (SOLU-MEDROL) injection  80 mg Intravenous Q6H  . mycophenolate  1,500 mg Oral BID  . tadalafil  20 mg Oral BID  . verapamil  120 mg Oral Daily   Continuous Infusions: . ceFEPime (MAXIPIME) IV Stopped (03/28/18 1025)     LOS: 1 day    Time spent: 76 minutes    Kimbly Eanes J [place] MD [facility] Pager [phone]  If 7PM-7AM, please contact night-coverage [email or web address] Password [facility] 03/28/2018, 11:36 AM

## 2018-03-29 DIAGNOSIS — J9621 Acute and chronic respiratory failure with hypoxia: Secondary | ICD-10-CM

## 2018-03-29 LAB — BASIC METABOLIC PANEL
Anion gap: 12 (ref 5–15)
BUN: 27 mg/dL — ABNORMAL HIGH (ref 6–20)
CO2: 27 mmol/L (ref 22–32)
Calcium: 8.7 mg/dL — ABNORMAL LOW (ref 8.9–10.3)
Chloride: 100 mmol/L (ref 98–111)
Creatinine, Ser: 1.03 mg/dL — ABNORMAL HIGH (ref 0.44–1.00)
GFR calc Af Amer: >60 mL/min (ref >60)
GFR calc non Af Amer: >60 mL/min (ref >60)
Glucose, Bld: 194 mg/dL — ABNORMAL HIGH (ref 70–99)
Potassium: 3.6 mmol/L (ref 3.5–5.1)
SODIUM: 139 mmol/L (ref 135–145)

## 2018-03-29 LAB — CBC
HCT: 41.9 % (ref 36.0–46.0)
Hemoglobin: 11.7 g/dL — ABNORMAL LOW (ref 12.0–15.0)
MCH: 23.8 pg — ABNORMAL LOW (ref 26.0–34.0)
MCHC: 27.9 g/dL — ABNORMAL LOW (ref 30.0–36.0)
MCV: 85.3 fL (ref 80.0–100.0)
Platelets: 337 10*3/uL (ref 150–400)
RBC: 4.91 MIL/uL (ref 3.87–5.11)
RDW: 18 % — ABNORMAL HIGH (ref 11.5–15.5)
WBC: 14.3 10*3/uL — ABNORMAL HIGH (ref 4.0–10.5)
nRBC: 0.1 % (ref 0.0–0.2)

## 2018-03-29 LAB — GLUCOSE, CAPILLARY
Glucose-Capillary: 225 mg/dL — ABNORMAL HIGH (ref 70–99)
Glucose-Capillary: 205 mg/dL — ABNORMAL HIGH (ref 70–99)
Glucose-Capillary: 164 mg/dL — ABNORMAL HIGH (ref 70–99)
Glucose-Capillary: 195 mg/dL — ABNORMAL HIGH (ref 70–99)

## 2018-03-29 LAB — LEGIONELLA PNEUMOPHILA SEROGP 1 UR AG: L. pneumophila Serogp 1 Ur Ag: NEGATIVE

## 2018-03-29 LAB — MAGNESIUM: MAGNESIUM: 2 mg/dL (ref 1.7–2.4)

## 2018-03-29 MED ORDER — LEVOFLOXACIN 750 MG PO TABS
750.0000 mg | ORAL_TABLET | Freq: Every day | ORAL | Status: DC
  Administered 2018-03-29 – 2018-03-30 (×2): 750 mg via ORAL
  Filled 2018-03-29 (×2): qty 1

## 2018-03-29 MED ORDER — PREDNISONE 20 MG PO TABS
20.0000 mg | ORAL_TABLET | Freq: Two times a day (BID) | ORAL | Status: AC
  Administered 2018-03-29 – 2018-03-30 (×3): 20 mg via ORAL
  Filled 2018-03-29 (×3): qty 1

## 2018-03-29 MED ORDER — POTASSIUM CHLORIDE CRYS ER 20 MEQ PO TBCR
40.0000 meq | EXTENDED_RELEASE_TABLET | Freq: Once | ORAL | Status: AC
  Administered 2018-03-29: 40 meq via ORAL
  Filled 2018-03-29: qty 2

## 2018-03-29 MED ORDER — FUROSEMIDE 40 MG PO TABS
40.0000 mg | ORAL_TABLET | Freq: Two times a day (BID) | ORAL | Status: DC
  Administered 2018-03-29 – 2018-03-31 (×4): 40 mg via ORAL
  Filled 2018-03-29 (×4): qty 1

## 2018-03-29 NOTE — Progress Notes (Signed)
PROGRESS NOTE    Felicia [name]  EKC:[id number] DOB: 04-May-1970 DOA: 03/27/2018 PCP: Aretta Nip, MD   Chief complaint: Shortness of breath   Brief Narrative:    Felicia [name] is a 48 y.o. female with medical history significant of NSIP on 10-15 L at home, pulmonary hypertension, HFpEF, hypertension, T2DM, SVT presents with worsening shortness of breath.   She notes that her symptoms started about 3 weeks ago when she had a sinus infection.  Treated with azithro and doxy.  She states that she has been on not return to her baseline yet.  Last 5 days things have gotten progressively worse.  She is noticed progressive shortness of breath, cough with mucus, and fatigue.  She notes subjective fevers and chills.  She notes chest pain only with her cough.  She notes nausea with the cough.  She denies any abdominal pain or vomiting.  She uses 10 L at home typically, but recently she has been adding an additional 5 L with a second nasal cannula.  She denies smoking or drinking.  She denies sick contacts, but did see the cardiologist last week and the pulmonologist within the past month.    ED Course: Labs, CXR, steroids, abx, lasix.  Admited for acute on chronic hypoxic respiratory failure.   Assessment & Plan:   Principal Problem:   Acute on chronic respiratory failure with hypoxia (HCC) Active Problems:   PSVT (paroxysmal supraventricular tachycardia) (HCC)   Essential hypertension   On home oxygen therapy   Chronic diastolic HF (heart failure) (HCC)   Pulmonary HTN (HCC)   ILD (interstitial lung disease) (HCC)   Acute on chronic hypoxic respiratory failure Hx nonspecific interstitial lung disease Patient presenting with progressive shortness of breath, history of nonspecific interstitial lung disease on home oxygen 10-15 L/min at baseline.  Follows with [place] pulmonology for her underlying pulmonary hypertension.  Started on broad-spectrum antibiotics with vancomycin and  cefepime initially for concern of infectious etiology; as well as IV steroids.  CT pulmonary angiogram negative for pulmonary embolism but notable for severe pulmonary hypertension with alveolar opacity consistent with fibrosis with multiple enlarged lymph nodes that is stable since previous exams. --Pulmonology following, appreciate assistance --Discontinued vancomycin 2/19 for negative MRSA PCR --HIV nonreactive, strep pneumo antigen negative, rapid influenza negative. --BNP 416 --Legionella antigen: Pending --d/c cefepime 2/20 --continue abx with Levaquin 750 mg p.o. daily x5 more days --Discontinue Solu-Medrol, start prednisone 20 mg p.o. twice daily x3 days then 64m PO daily --Lasix 40 mg PO BID --DuoNebs, albuterol as needed --Continue BiPAP prn; may benefit from home use per pulmonary --Strict I's and O's, daily weights --CM/SW for assistance with home BiPAP and additional oxygen concentrator  HFpEF Severe Pulmonary Hypertension:  Echo normal EF with mild LVH, mild RV systolic dysfunction (see care everywhere - 02/28/2018). PFT's Jan 2019 w/ FVC 1.25 L 36% predicted, FEV1 1.18 L 40% predicted with DLCO of 17. [facility].  [facility] --Continue diuresis with furosemide 40 mg PO BID --Continue tadalafil 254mPO BID --Continue mycophenolate 150023mO BID --Pulmonology considering starting [facility] to [facility] I's [facility]'s  Hypertension: Holding home HCTZ with diuresis with lasix above.   --Continue verapamil and atenolol  Sinus Tachycardia Hx SVT: due to respiratory issues above, continue to monitor with treatment/diuresis.   --Continue AV nodal blockers with verapamil and atenolol  T2DM:  Hemoglobin A1c 6.3 on 02/03/2017.  Holding home  metformin.  Glucose elevated likely secondary to side effect of IV steroids.  Continue to hold home metformin. --Carbohydrate  consistent diet --Lantus 10 units subcutaneously daily --Continue insulin sliding scale for coverage --Continue monitor glucose closely, now de-escalating steriods  Hypokalemia: replace, and continue to monitor daily with magnesium  QT prolongation: follow mag, caution with qt prolonging meds    DVT prophylaxis: Lovenox Code Status: Partial, no intubation; otherwise no other exclusions Family Communication: None Disposition Plan: Inpatient level of care in the stepdown unit, hopeful d/c home in 2-3 days with outpatient follow-up with [place] pulmonology   Consultants:   Pulmonology  Procedures:  none  Antimicrobials:   Vancomycin 2/18-2/19  Cefepime 2/18-2/20  Levaquin PO 2/20>>>   Subjective: Patient seen and examined at bedside, resting comfortably on high flow [place] at 15L/min.  Dyspnea improved but continues with significant symptoms especially with activity.  Discussed with pulmonologist this morning, not much that we can add except ensuring that she has adequate oxygen at home.  No other complaints at this time.  Denies headache, no visual changes, no chest pain, no palpitations, no nausea/vomiting/diarrhea, no abdominal pain, no issues with bowel bladder function.  No acute events overnight per nursing staff.  Objective: Vitals:   03/29/18 0733 03/29/18 0800 03/29/18 0850 03/29/18 1000  BP: (!) 132/48  (!) 131/93 (!) 114/50  Pulse: 76   92  Resp: (!) 25   (!) 26  Temp:  (!) 97.5 F (36.4 C)    TempSrc:  Axillary    SpO2: 100%   95%  Weight:      Height:        Intake/Output Summary (Last 24 hours) at 03/29/2018 1146 Last data filed at 03/29/2018 1100 Gross per 24 hour  Intake 434.7 ml  Output 2850 ml  Net -2415.3 ml   Filed Weights   03/27/18 1018 03/28/18 0628 03/29/18 0500  Weight: 108.8 kg 104.5 kg 92.8 kg    Examination:  General exam: Appears calm and comfortable, continues on nonrebreather Respiratory system: Coarse breath sounds bilaterally,  slightly decreased bases, no crackles, normal respiratory effort on 15 L HFNC Cardiovascular system: S1 & S2 heard, RRR. No JVD, murmurs, rubs, gallops or clicks. No pedal edema. Gastrointestinal system: Abdomen protuberant, nondistended, soft and nontender. No organomegaly or masses felt. Normal bowel sounds heard. Central nervous system: Alert and oriented. No focal neurological deficits. Extremities: Symmetric 5 x 5 power. Skin: No rashes, lesions or ulcers Psychiatry: Judgement and insight appear normal. Mood & affect appropriate.     Data Reviewed: I have personally reviewed following labs and imaging studies  CBC: Recent Labs  Lab 03/27/18 0601 03/28/18 0328 03/29/18 0313  WBC 11.6* 7.9 14.3*  HGB 11.7* 11.5* 11.7*  HCT 41.9 41.0 41.9  MCV 84.8 84.5 85.3  PLT 314 329 697   Basic Metabolic Panel: Recent Labs  Lab 03/27/18 0606 03/28/18 0328 03/29/18 0313  NA 139 140 139  K 3.3* 3.6 3.6  CL 101 101 100  CO2 _0 GLUCOSE 159* 205* 194*  BUN 9 13 27*  CREATININE 0.80 0.97 1.03*  CALCIUM 8.7* 8.5* 8.7*  MG 1.7  --  2.0   GFR: Estimated Creatinine Clearance: 75.2 mL/min (A) (by C-G formula based on SCr of 1.03 mg/dL (H)). Liver Function Tests: Recent Labs  Lab 03/27/18 0606 03/28/18 0328  AST 23 22  ALT 14 16  ALKPHOS 54 52  BILITOT 0.9 1.2  PROT 7.3 7.5  ALBUMIN 3.6 3.6  No results for input(s): LIPASE, AMYLASE in the last 168 hours. No results for input(s): AMMONIA in the last 168 hours. Coagulation Profile: No results for input(s): INR, PROTIME in the last 168 hours. Cardiac Enzymes: No results for input(s): CKTOTAL, CKMB, CKMBINDEX, TROPONINI in the last 168 hours. BNP (last 3 results) No results for input(s): PROBNP in the last 8760 hours. HbA1C: No results for input(s): HGBA1C in the last 72 hours. CBG: Recent Labs  Lab 03/28/18 0830 03/28/18 1134 03/28/18 1630 03/28/18 2122 03/29/18 0819  GLUCAP 208* 212* 183* 216* 225*   Lipid  Profile: No results for input(s): CHOL, HDL, LDLCALC, TRIG, CHOLHDL, LDLDIRECT in the last 72 hours. Thyroid Function Tests: No results for input(s): TSH, T4TOTAL, FREET4, T3FREE, THYROIDAB in the last 72 hours. Anemia Panel: No results for input(s): VITAMINB12, FOLATE, FERRITIN, TIBC, IRON, RETICCTPCT in the last 72 hours. Sepsis Labs: No results for input(s): PROCALCITON, LATICACIDVEN in the last 168 hours.  Recent Results (from the past 240 hour(s))  Respiratory Panel by PCR     Status: None   Collection Time: 03/27/18  6:06 AM  Result Value Ref Range Status   Adenovirus NOT DETECTED NOT DETECTED Final   Coronavirus 229E NOT DETECTED NOT DETECTED Final    Comment: (NOTE) The Coronavirus on the Respiratory Panel, DOES NOT test for the novel  Coronavirus (2019 nCoV)    Coronavirus HKU1 NOT DETECTED NOT DETECTED Final   Coronavirus NL63 NOT DETECTED NOT DETECTED Final   Coronavirus OC43 NOT DETECTED NOT DETECTED Final   Metapneumovirus NOT DETECTED NOT DETECTED Final   Rhinovirus / Enterovirus NOT DETECTED NOT DETECTED Final   Influenza A NOT DETECTED NOT DETECTED Final   Influenza B NOT DETECTED NOT DETECTED Final   Parainfluenza Virus 1 NOT DETECTED NOT DETECTED Final   Parainfluenza Virus 2 NOT DETECTED NOT DETECTED Final   Parainfluenza Virus 3 NOT DETECTED NOT DETECTED Final   Parainfluenza Virus 4 NOT DETECTED NOT DETECTED Final   Respiratory Syncytial Virus NOT DETECTED NOT DETECTED Final   Bordetella pertussis NOT DETECTED NOT DETECTED Final   Chlamydophila pneumoniae NOT DETECTED NOT DETECTED Final   Mycoplasma pneumoniae NOT DETECTED NOT DETECTED Final    Comment: Performed at [facility], [address] [place], [place] [address]  Culture, blood (routine x 2)     Status: None (Preliminary result)   Collection Time: 03/27/18  9:14 AM  Result Value Ref Range Status   Specimen Description   Final    BLOOD LEFT ANTECUBITAL Performed at [facility],  [place] [address], [place], [place] [address]    Special Requests   Final    BOTTLES DRAWN AEROBIC AND ANAEROBIC Blood Culture adequate volume Performed at [place] [address] [place], [place] [address]    Culture   Final    NO GROWTH 1 DAY Performed at [facility], [place] [address] [place], [place] [address]    Report Status PENDING  Incomplete  Culture, blood (routine x 2)     Status: None (Preliminary result)   Collection Time: 03/27/18  9:14 AM  Result Value Ref Range Status   Specimen Description   Final    BLOOD RIGHT ARM Performed at [place] [address] [place], [place] [address]    Special Requests   Final    BOTTLES DRAWN AEROBIC AND ANAEROBIC Blood Culture adequate volume Performed at [place] [address] [place],  [place] [address]    Culture   Final    NO GROWTH 1 DAY Performed at [facility], [place] [address] [place], [place] [address]    Report Status PENDING  Incomplete  MRSA PCR Screening     Status: None   Collection Time: 03/27/18 10:29 AM  Result Value Ref Range Status   MRSA by PCR NEGATIVE NEGATIVE Final    Comment:        The GeneXpert MRSA Assay (FDA approved for NASAL specimens only), is one component of a comprehensive MRSA colonization surveillance program. It is not intended to diagnose MRSA infection nor to guide or monitor treatment for MRSA infections. Performed at [facility], [place] [address], [place], [place] [address]          Radiology Studies: No results found.      Scheduled Meds: . atenolol  75 mg Oral BID  . enoxaparin (LOVENOX) injection  40 mg Subcutaneous Q24H  . furosemide  40 mg Oral BID  . insulin aspart  0-5 Units Subcutaneous QHS  . insulin aspart  0-9 Units Subcutaneous TID WC  . insulin glargine  10 Units Subcutaneous Daily  . ipratropium-albuterol  3 mL Nebulization Q6H  . levofloxacin  750 mg Oral  q1800  . mouth rinse  15 mL Mouth Rinse BID  . mycophenolate  1,500 mg Oral BID  . predniSONE  20 mg Oral BID WC  . tadalafil  20 mg Oral BID  . verapamil  120 mg Oral Daily   Continuous Infusions:    LOS: 2 days    Time spent: 36 minutes    Ellamay Fors J [place] DO [facility] Pager [phone]  If 7PM-7AM, please contact night-coverage [email or web address] Password [name] 03/29/2018, 11:46 AM

## 2018-03-29 NOTE — Progress Notes (Signed)
NAME:  Felicia [name], MRN:  [id number], DOB:  03-17-1970, LOS: 2 ADMISSION DATE:  03/27/2018, CONSULTATION DATE:  03/27/2018  REFERRING MD:  Dr Florene Glen, CHIEF COMPLAINT: Shortness of breath  Brief History   Patient presented with shortness of breath of 4-5 days duration Recent URI symptoms Background history of NSIP She recently had 2 courses of rituximab-was discontinued Has been on CellCept Has pulmonary hypertension Diastolic heart failure On very high flow oxygen at baseline with increased requirement in the last few days She is generally feeling better Past Medical History  Diabetes type 2, NSIP, hypertension, SVT, pulmonary hypertension  Pulmonary hypertension  Significant Hospital Events   2/18 Shortness of breath, tachypnea, on a nonrebreather at present.  Placed on broad-spectrum antibiotics, IV diuresis, high flow oxygen, and systemic steroids 2/19: Feeling some better.  -1.2 L with diuresis.  Transitioning to high flow from nonrebreather 2/20-improvement in shortness of breath, improved oxygen requirement  Consults:  PCCM 03/27/2018  Procedures:    Significant Diagnostic Tests:  Chest x-ray with bibasal haziness, cardiomegaly E[facility]ocardiogram at [name] December 2018 the RV is severely enlarged and hypokinetic artery moderately enlarged RVSP of 70 with dilated IVC normal LV. An echo in October 2016 showed normal LV with mild LVH normal left atrial size RV then was mildly enlarged but had normal function and RVSP of 27 Pulmonary function test January 2019 FVC 1.25 L 36% predicted, FEV1 1.18 L 40% predicted with DLCO of 17  Micro Data:  Influenza a and B by PCR negative Viral panel -negative Antimicrobials:  Received vancomycin and cefepime Vancomycin 2/18>>> Cefepime 2/18>>>  Interim history/subjective:  She feels better.  Oxygen requirements seem to have improved.  Intake/output balance -1.2 L currently Objective   Blood pressure (!) 131/93,  pulse 76, temperature (!) 97.5 F (36.4 C), temperature source Axillary, resp. rate (!) 25, height _0  (1.651 m), weight 92.8 kg, SpO2 100 %.        Intake/Output Summary (Last 24 hours) at 03/29/2018 0932 Last data filed at 03/29/2018 0741 Gross per 24 hour  Intake 534.76 ml  Output 2350 ml  Net -1815.24 ml   Filed Weights   03/27/18 1018 03/28/18 0628 03/29/18 0500  Weight: 108.8 kg 104.5 kg 92.8 kg    Examination: General: Does not appear to be in acute distress  HEENT normocephalic atraumatic, moist oral mucosa  pulmonary: Basilar rales, some scattered rhonchi, no accessory use while at rest Cardiac: S1-S2 appreciated, regular rate and rhythm Abdomen: Positive bowel sounds, nontender Extremities: Warm and dry brisk capillary refill no significant edema Neuro: Awake oriented no focal deficits   Echocardiogram from 02/28/2018 More prominent right ventricular dysfunction, normal left ventricular systolic function, multiple valvulopathy Requirement ventricular pressure of 53  Assessment & Plan:  Acute on chronic respiratory failure secondary to exacerbation of NSIP versus pneumonia -Cultures negative, influenza negative, respiratory viral panel negative  Continue supplemental oxygen Continue diuresis We will de-escalate antibiotics-discontinue cefepime, place on Levaquin orally Switch to oral steroids-prednisone 20 p.o. twice daily for 3 days and then transition to 20 mg daily  Diastolic heart failure -Cautious diuresis -switch to PO lasix  History of supraventricular tachycardia Plan Continue beta-blockade  Pulmonary hypertension -Pulmonary hypertension is severe -Has been tolerating tadalafil Plan Continue with tadalafil   Diabetes Plan SSI   Patient with multiple comorbidities with advanced pulmonary fibrosis-NSIP, pulmonary hypertension Appears to be stabilizing  May be transferred to medical floor Consult social worker for assistance with oxygen  supplementation Patient currently  on 10 L which she increases to 15 L with activity, saturations continue to be very low with intervention Unfortunately, no other intervention will be helpful aside from assisting with increasing oxygen supplementation-she will require another source of oxygen Current supplies [place] practice:  Diet: heart healthy Pain/Anxiety/Delirium protocol (if indicated): As needed VAP protocol (if indicated): n/a DVT prophylaxis: Lovenox GI prophylaxis: Protonix Glucose control: ssi Mobility: Bedrest Code Status: Full Family Communication: sister at bedside Disposition: Transfer to medical floor

## 2018-03-29 NOTE — Progress Notes (Signed)
Pt DME agency has received request for new O2 concentrator from pt.  Will replace per pt.  Danton Clap, RN

## 2018-03-30 LAB — MAGNESIUM: Magnesium: 2.3 mg/dL (ref 1.7–2.4)

## 2018-03-30 LAB — GLUCOSE, CAPILLARY
Glucose-Capillary: 184 mg/dL — ABNORMAL HIGH (ref 70–99)
Glucose-Capillary: 230 mg/dL — ABNORMAL HIGH (ref 70–99)
Glucose-Capillary: 231 mg/dL — ABNORMAL HIGH (ref 70–99)
Glucose-Capillary: 214 mg/dL — ABNORMAL HIGH (ref 70–99)

## 2018-03-30 LAB — CBC
HCT: 41.8 % (ref 36.0–46.0)
Hemoglobin: 11.7 g/dL — ABNORMAL LOW (ref 12.0–15.0)
MCH: 23.7 pg — ABNORMAL LOW (ref 26.0–34.0)
MCHC: 28 g/dL — ABNORMAL LOW (ref 30.0–36.0)
MCV: 84.8 fL (ref 80.0–100.0)
Platelets: 298 10*3/uL (ref 150–400)
RBC: 4.93 MIL/uL (ref 3.87–5.11)
RDW: 17.6 % — ABNORMAL HIGH (ref 11.5–15.5)
WBC: 12.5 10*3/uL — AB (ref 4.0–10.5)
nRBC: 0.2 % (ref 0.0–0.2)

## 2018-03-30 LAB — BASIC METABOLIC PANEL
Anion gap: 12 (ref 5–15)
BUN: 34 mg/dL — ABNORMAL HIGH (ref 6–20)
CO2: 27 mmol/L (ref 22–32)
Calcium: 8.6 mg/dL — ABNORMAL LOW (ref 8.9–10.3)
Chloride: 98 mmol/L (ref 98–111)
Creatinine, Ser: 0.94 mg/dL (ref 0.44–1.00)
GFR calc Af Amer: >60 mL/min (ref >60)
GFR calc non Af Amer: >60 mL/min (ref >60)
Glucose, Bld: 193 mg/dL — ABNORMAL HIGH (ref 70–99)
POTASSIUM: 3.8 mmol/L (ref 3.5–5.1)
Sodium: 137 mmol/L (ref 135–145)

## 2018-03-30 MED ORDER — HYDROCODONE-HOMATROPINE 5-1.5 MG/5ML PO SYRP
5.0000 mL | ORAL_SOLUTION | ORAL | Status: DC | PRN
  Administered 2018-03-30 – 2018-03-31 (×7): 5 mL via ORAL
  Filled 2018-03-30 (×7): qty 5

## 2018-03-30 NOTE — Progress Notes (Addendum)
NAME:  Felicia [name], MRN:  [id number], DOB:  01/20/1971, LOS: 3 ADMISSION DATE:  03/27/2018, CONSULTATION DATE:  03/27/2018  REFERRING MD:  Dr Florene Glen, CHIEF COMPLAINT: Shortness of breath  Brief History   Patient presented with shortness of breath of 4-5 days duration Recent URI symptoms Background history of NSIP She recently had 2 courses of rituximab-was discontinued Has been on CellCept Has pulmonary hypertension Diastolic heart failure On very high flow oxygen at baseline with increased requirement in the last few days She is generally feeling better Past Medical History  Diabetes type 2, NSIP, hypertension, SVT, pulmonary hypertension Pulmonary hypertension  Significant Hospital Events   2/18 Shortness of breath, tachypnea, on a nonrebreather at present.  Placed on broad-spectrum antibiotics, IV diuresis, high flow oxygen, and systemic steroids 2/19: Feeling some better.  -1.2 L with diuresis.  Transitioning to high flow from nonrebreather 2/20-improvement in shortness of breath, improved oxygen requirement  Consults:  PCCM 03/27/2018  Procedures:    Significant Diagnostic Tests:  Chest x-ray with bibasal haziness, cardiomegaly E[facility]ocardiogram at [facility] December 2018 the RV is severely enlarged and hypokinetic artery moderately enlarged RVSP of 70 with dilated IVC normal LV. An echo in October 2016 showed normal LV with mild LVH normal left atrial size RV then was mildly enlarged but had normal function and RVSP of 27 Pulmonary function test January 2019 FVC 1.25 L 36% predicted, FEV1 1.18 L 40% predicted with DLCO of 17  Micro Data:  Influenza a and B by PCR negative Viral panel -negative Antimicrobials:  Received vancomycin and cefepime Vancomycin 2/18>>>2/20 Cefepime 2/18>>>2/20 levaquin 2/20>>>  Interim history/subjective:  Feels better. Wants to go home (planning on Saturday) Objective   Blood pressure (Abnormal) 144/96, pulse 72, temperature  97.7 F (36.5 C), temperature source Axillary, resp. rate (Abnormal) 22, height _0  (1.651 m), weight 101.6 kg, SpO2 100 %.        Intake/Output Summary (Last 24 hours) at 03/30/2018 0822 Last data filed at 03/30/2018 0640 Gross per 24 hour  Intake 940 ml  Output 2250 ml  Net -1310 ml   Filed Weights   03/28/18 0628 03/29/18 0500 03/30/18 0500  Weight: 104.5 kg 92.8 kg 101.6 kg    Examination: General 48 year old aaf. Resting in bed. NAD HENT NCAT no JVD MMM pulm decreased bases. No accessory use  Card RRR  abd not tender + bowel sounds  Ext no edema brisk CR  Neuro awake no focal def  gu voiding   Echocardiogram from 02/28/2018 More prominent right ventricular dysfunction, normal left ventricular systolic function, multiple valvulopathy Requirement ventricular pressure of 53  Assessment & Plan:  Acute on chronic respiratory failure secondary to exacerbation of NSIP versus pneumonia (NOS) -Cultures negative, influenza negative, respiratory viral panel negative Plan Cont supplemental oxygen Levaquin (now abx day 4; would complete 7 days) Cont lasix/diuresis as tolerated Cont pred 62m bid for 2 more days then 252mdaily F/u w/ Dr OlAnder Sladeon march 3 at [id number]  Diastolic heart failure Plan Cont lasix as above   History of supraventricular tachycardia Plan Cont BB as above  Pulmonary hypertension -Pulmonary hypertension is severe -Has been tolerating tadalafil Plan Cont rx   Diabetes Plan ssi     Best practice:  Diet: heart healthy Pain/Anxiety/Delirium protocol (if indicated): As needed VAP protocol (if indicated): n/a DVT prophylaxis: Lovenox GI prophylaxis: Protonix Glucose control: ssi Mobility: Bedrest Code Status: Full Family Communication: sister at bedside Disposition: Transfer to medical floor;  home likely 2/22. Have set up follow up here for locally. Still needs to follow at [facility] as well.   Erick Colace ACNP-BC [place] Pager # [phone] OR # [phone] if no answer

## 2018-03-30 NOTE — Progress Notes (Signed)
PROGRESS NOTE    Felicia [name]  NGE:[id number] DOB: 11-17-1970 DOA: 03/27/2018 PCP: Aretta Nip, MD   Chief complaint: Shortness of breath   Brief Narrative:    Felicia [name] is a 48 y.o. female with medical history significant of NSIP on 10-15 L at home, pulmonary hypertension, HFpEF, hypertension, T2DM, SVT presents with worsening shortness of breath.   She notes that her symptoms started about 3 weeks ago when she had a sinus infection.  Treated with azithro and doxy.  She states that she has been on not return to her baseline yet.  Last 5 days things have gotten progressively worse.  She is noticed progressive shortness of breath, cough with mucus, and fatigue.  She notes subjective fevers and chills.  She notes chest pain only with her cough.  She notes nausea with the cough.  She denies any abdominal pain or vomiting.  She uses 10 L at home typically, but recently she has been adding an additional 5 L with a second nasal cannula.  She denies smoking or drinking.  She denies sick contacts, but did see the cardiologist last week and the pulmonologist within the past month.    ED Course: Labs, CXR, steroids, abx, lasix.  Admited for acute on chronic hypoxic respiratory failure.   Assessment & Plan:   Principal Problem:   Acute on chronic respiratory failure with hypoxia (HCC) Active Problems:   PSVT (paroxysmal supraventricular tachycardia) (HCC)   Essential hypertension   On home oxygen therapy   Chronic diastolic HF (heart failure) (HCC)   Pulmonary HTN (HCC)   ILD (interstitial lung disease) (HCC)   Acute on chronic hypoxic respiratory failure Hx nonspecific interstitial lung disease Patient presenting with progressive shortness of breath, history of nonspecific interstitial lung disease on home oxygen 10-15 L/min at baseline.  Follows with [facility] for her underlying pulmonary hypertension.  Started on broad-spectrum antibiotics with vancomycin and  cefepime initially for concern of infectious etiology; as well as IV steroids.  CT pulmonary angiogram negative for pulmonary embolism but notable for severe pulmonary hypertension with alveolar opacity consistent with fibrosis with multiple enlarged lymph nodes that is stable since previous exams. --Pulmonology following, appreciate assistance --Discontinued vancomycin 2/19 for negative MRSA PCR --HIV nonreactive, strep pneumo antigen negative, rapid influenza negative. --BNP 416 --Legionella antigen: Pending --d/c cefepime 2/20 --continue abx with Levaquin 750 mg p.o. daily, Day #4 with planned 7 day course --continue prednisone 20 mg p.o. twice daily x3 days then 61m PO daily --Lasix 40 mg PO BID --DuoNebs, albuterol as needed --Continue BiPAP prn; may benefit from home use per pulmonary --Strict I's and O's, daily weights --CM/SW for assistance with home BiPAP and additional oxygen concentrator  HFpEF Severe Pulmonary Hypertension:  Echo normal EF with mild LVH, mild RV systolic dysfunction (see care everywhere - 02/28/2018). PFT's Jan 2019 w/ FVC 1.25 L 36% predicted, FEV1 1.18 L 40% predicted with DLCO of 17. [facility].  [name] --Continue diuresis with furosemide 40 mg PO BID --Continue tadalafil 27mPO BID --Continue mycophenolate 150019mO BID --Pulmonology considering starting Tyvaso; [facility] to [facility] --As outpatient follow-up with Dr. OlaAnder Slade March 3 at 2:45pm  Hypertension: Holding home HCTZ; continue diuresis with lasix above.   --Continue verapamil and atenolol  Sinus Tachycardia Hx SVT: due to respiratory issues above, continue to monitor with treatment/diuresis.   --Continue AV nodal blockers with verapamil  and atenolol  T2DM:  Hemoglobin A1c 6.3 on 02/03/2017.  Holding home metformin.  Glucose elevated likely secondary to side  effect of IV steroids.  Continue to hold home metformin. --Carbohydrate consistent diet --Lantus 10 units subcutaneously daily --Continue insulin sliding scale for coverage --Continue monitor glucose closely, now de-escalating steriods  Hypokalemia:  Resolved, continue to monitor electrolytes daily  QT prolongation: follow mag, caution with qt prolonging meds    DVT prophylaxis: Lovenox Code Status: Partial, no intubation; otherwise no other exclusions Family Communication: None Disposition Plan: Inpatient level of care in the stepdown unit, hopeful d/c home in 1-2 days with outpatient follow-up with [facility]; needs delivery of second oxygen concentrator prior to discharge.   Consultants:   Pulmonology, Dr. [name], MD.  Procedures:  none  Antimicrobials:   Vancomycin 2/18-2/19  Cefepime 2/18-2/20  Levaquin PO 2/20>>>   Subjective: Patient seen and examined at bedside, resting comfortably on high flow [place] at 15L/min.  Dyspnea improving daily. No other complaints at this time.  As talk with home health company regarding additional oxygen concentrator, they plan on delivering.  Denies headache, no visual changes, no chest pain, no palpitations, no nausea/vomiting/diarrhea, no abdominal pain, no issues with bowel bladder function.  No acute events overnight per nursing staff.  Objective: Vitals:   03/30/18 0854 03/30/18 0900 03/30/18 1000 03/30/18 1100  BP:  (!) 149/58 137/70 (!) 132/50  Pulse:  82 80 81  Resp:  18 (!) 26 (!) 32  Temp:      TempSrc:      SpO2: 96% 99% 96% 93%  Weight:      Height:        Intake/Output Summary (Last 24 hours) at 03/30/2018 1238 Last data filed at 03/30/2018 0845 Gross per 24 hour  Intake 940 ml  Output 1850 ml  Net -910 ml   Filed Weights   03/28/18 0628 03/29/18 0500 03/30/18 0500  Weight: 104.5 kg 92.8 kg 101.6 kg    Examination:  General exam: Appears calm and comfortable, continues on nonrebreather Respiratory  system: Coarse breath sounds bilaterally, slightly decreased bases, no crackles, normal respiratory effort on 15 L HFNC, no accessory muscle use Cardiovascular system: S1 & S2 heard, RRR. No JVD, murmurs, rubs, gallops or clicks. No pedal edema. Gastrointestinal system: Abdomen protuberant, nondistended, soft and nontender. No organomegaly or masses felt. Normal bowel sounds heard. Central nervous system: Alert and oriented. No focal neurological deficits. Extremities: Symmetric 5 x 5 power. Skin: No rashes, lesions or ulcers Psychiatry: Judgement and insight appear normal. Mood & affect appropriate.     Data Reviewed: I have personally reviewed following labs and imaging studies  CBC: Recent Labs  Lab 03/27/18 0601 03/28/18 0328 03/29/18 0313 03/30/18 0312  WBC 11.6* 7.9 14.3* 12.5*  HGB 11.7* 11.5* 11.7* 11.7*  HCT 41.9 41.0 41.9 41.8  MCV 84.8 84.5 85.3 84.8  PLT 314 329 337 011   Basic Metabolic Panel: Recent Labs  Lab 03/27/18 0606 03/28/18 0328 03/29/18 0313 03/30/18 0312  NA 139 140 139 137  K 3.3* 3.6 3.6 3.8  CL 101 101 100 98  CO2 _0 GLUCOSE 159* 205* 194* 193*  BUN 9 13 27* 34*  CREATININE 0.80 0.97 1.03* 0.94  CALCIUM 8.7* 8.5* 8.7* 8.6*  MG 1.7  --  2.0 2.3   GFR: Estimated Creatinine Clearance: 86.4 mL/min (by C-G formula based on SCr of 0.94 mg/dL). Liver Function Tests: Recent Labs  Lab 03/27/18 0606 03/28/18 0034  AST 23 22  ALT 14 16  ALKPHOS 54 52  BILITOT 0.9 1.2  PROT 7.3 7.5  ALBUMIN 3.6 3.6   No results for input(s): LIPASE, AMYLASE in the last 168 hours. No results for input(s): AMMONIA in the last 168 hours. Coagulation Profile: No results for input(s): INR, PROTIME in the last 168 hours. Cardiac Enzymes: No results for input(s): CKTOTAL, CKMB, CKMBINDEX, TROPONINI in the last 168 hours. BNP (last 3 results) No results for input(s): PROBNP in the last 8760 hours. HbA1C: No results for input(s): HGBA1C in the last 72  hours. CBG: Recent Labs  Lab 03/29/18 1148 03/29/18 1652 03/29/18 2147 03/30/18 0741 03/30/18 1209  GLUCAP 205* 164* 195* 184* 230*   Lipid Profile: No results for input(s): CHOL, HDL, LDLCALC, TRIG, CHOLHDL, LDLDIRECT in the last 72 hours. Thyroid Function Tests: No results for input(s): TSH, T4TOTAL, FREET4, T3FREE, THYROIDAB in the last 72 hours. Anemia Panel: No results for input(s): VITAMINB12, FOLATE, FERRITIN, TIBC, IRON, RETICCTPCT in the last 72 hours. Sepsis Labs: No results for input(s): PROCALCITON, LATICACIDVEN in the last 168 hours.  Recent Results (from the past 240 hour(s))  Respiratory Panel by PCR     Status: None   Collection Time: 03/27/18  6:06 AM  Result Value Ref Range Status   Adenovirus NOT DETECTED NOT DETECTED Final   Coronavirus 229E NOT DETECTED NOT DETECTED Final    Comment: (NOTE) The Coronavirus on the Respiratory Panel, DOES NOT test for the novel  Coronavirus (2019 nCoV)    Coronavirus HKU1 NOT DETECTED NOT DETECTED Final   Coronavirus NL63 NOT DETECTED NOT DETECTED Final   Coronavirus OC43 NOT DETECTED NOT DETECTED Final   Metapneumovirus NOT DETECTED NOT DETECTED Final   Rhinovirus / Enterovirus NOT DETECTED NOT DETECTED Final   Influenza A NOT DETECTED NOT DETECTED Final   Influenza B NOT DETECTED NOT DETECTED Final   Parainfluenza Virus 1 NOT DETECTED NOT DETECTED Final   Parainfluenza Virus 2 NOT DETECTED NOT DETECTED Final   Parainfluenza Virus 3 NOT DETECTED NOT DETECTED Final   Parainfluenza Virus 4 NOT DETECTED NOT DETECTED Final   Respiratory Syncytial Virus NOT DETECTED NOT DETECTED Final   Bordetella pertussis NOT DETECTED NOT DETECTED Final   Chlamydophila pneumoniae NOT DETECTED NOT DETECTED Final   Mycoplasma pneumoniae NOT DETECTED NOT DETECTED Final    Comment: Performed at [facility], [address] [place], [place] [address]  Culture, blood (routine x 2)     Status: None (Preliminary result)   Collection  Time: 03/27/18  9:14 AM  Result Value Ref Range Status   Specimen Description   Final    BLOOD LEFT ANTECUBITAL Performed at [facility], [place] [address] [place], [place] [address]    Special Requests   Final    BOTTLES DRAWN AEROBIC AND ANAEROBIC Blood Culture adequate volume Performed at [facility] [address], [place], [place] [address]    Culture NO GROWTH 3 DAYS  Final   Report Status PENDING  Incomplete  Culture, blood (routine x 2)     Status: None (Preliminary result)   Collection Time: 03/27/18  9:14 AM  Result Value Ref Range Status   Specimen Description BLOOD RIGHT ARM  Final   Special Requests   Final    BOTTLES DRAWN AEROBIC AND ANAEROBIC Blood Culture adequate volume Performed at [facility] [address], [place],  [address]    Culture NO GROWTH 3 DAYS  Final   Report Status PENDING  Incomplete  MRSA PCR Screening     Status: None   Collection Time: 03/27/18 10:29 AM  Result Value Ref Range Status   MRSA by PCR NEGATIVE NEGATIVE Final    Comment:        The GeneXpert MRSA Assay (FDA approved for NASAL specimens only), is one component of a comprehensive MRSA colonization surveillance program. It is not intended to diagnose MRSA infection nor to guide or monitor treatment for MRSA infections. Performed at [facility], [place] [address], [place], [place] [address]          Radiology Studies: No results found.      Scheduled Meds: . atenolol  75 mg Oral BID  . enoxaparin (LOVENOX) injection  40 mg Subcutaneous Q24H  . furosemide  40 mg Oral BID  . insulin aspart  0-5 Units Subcutaneous QHS  . insulin aspart  0-9 Units Subcutaneous TID WC  . insulin glargine  10 Units Subcutaneous Daily  . ipratropium-albuterol  3 mL Nebulization Q6H  . levofloxacin  750 mg Oral q1800  . mouth rinse  15 mL Mouth Rinse BID  . mycophenolate  1,500 mg Oral BID  . predniSONE  20 mg Oral  BID WC  . tadalafil  20 mg Oral BID  . verapamil  120 mg Oral Daily   Continuous Infusions:    LOS: 3 days    Time spent: 36 minutes    Eric J [place] DO [facility] Pager [phone]  If 7PM-7AM, please contact night-coverage [email or web address] Password [facility] 03/30/2018, 12:38 PM

## 2018-03-30 NOTE — Progress Notes (Signed)
LCSW consulted for oxygen.  RNCM can assist with home health needs.   LCSW signign off. No CSW needs. Please submit new consult if CSW needs arise.   Carolin Coy [place]

## 2018-03-31 DIAGNOSIS — J189 Pneumonia, unspecified organism: Secondary | ICD-10-CM | POA: Diagnosis present

## 2018-03-31 LAB — EXPECTORATED SPUTUM ASSESSMENT W GRAM STAIN, RFLX TO RESP C

## 2018-03-31 LAB — GLUCOSE, CAPILLARY
Glucose-Capillary: 156 mg/dL — ABNORMAL HIGH (ref 70–99)
Glucose-Capillary: 142 mg/dL — ABNORMAL HIGH (ref 70–99)

## 2018-03-31 LAB — CBC
HCT: 42.6 % (ref 36.0–46.0)
HEMOGLOBIN: 11.8 g/dL — AB (ref 12.0–15.0)
MCH: 23.8 pg — ABNORMAL LOW (ref 26.0–34.0)
MCHC: 27.7 g/dL — ABNORMAL LOW (ref 30.0–36.0)
MCV: 86.1 fL (ref 80.0–100.0)
Platelets: 273 10*3/uL (ref 150–400)
RBC: 4.95 MIL/uL (ref 3.87–5.11)
RDW: 17.5 % — ABNORMAL HIGH (ref 11.5–15.5)
WBC: 11.4 10*3/uL — AB (ref 4.0–10.5)
nRBC: 0 % (ref 0.0–0.2)

## 2018-03-31 LAB — BASIC METABOLIC PANEL
Anion gap: 9 (ref 5–15)
BUN: 25 mg/dL — ABNORMAL HIGH (ref 6–20)
CO2: 32 mmol/L (ref 22–32)
Calcium: 8.1 mg/dL — ABNORMAL LOW (ref 8.9–10.3)
Chloride: 95 mmol/L — ABNORMAL LOW (ref 98–111)
Creatinine, Ser: 0.87 mg/dL (ref 0.44–1.00)
GFR calc Af Amer: >60 mL/min (ref >60)
GFR calc non Af Amer: >60 mL/min (ref >60)
Glucose, Bld: 227 mg/dL — ABNORMAL HIGH (ref 70–99)
Potassium: 4.4 mmol/L (ref 3.5–5.1)
Sodium: 136 mmol/L (ref 135–145)

## 2018-03-31 LAB — EXPECTORATED SPUTUM ASSESSMENT W REFEX TO RESP CULTURE

## 2018-03-31 MED ORDER — FUROSEMIDE 40 MG PO TABS: 40.0000 mg | ORAL_TABLET | Freq: Two times a day (BID) | ORAL | 0 refills | Status: DC

## 2018-03-31 MED ORDER — LEVOFLOXACIN 750 MG PO TABS: 750.0000 mg | ORAL_TABLET | Freq: Every day | ORAL | 0 refills | Status: AC

## 2018-03-31 MED ORDER — HYDROCODONE-HOMATROPINE 5-1.5 MG/5ML PO SYRP: 5.0000 mL | ORAL_SOLUTION | ORAL | 0 refills | Status: DC | PRN

## 2018-03-31 MED ORDER — LIVING BETTER WITH HEART FAILURE BOOK
Freq: Once | Status: AC
  Administered 2018-03-31: 12:00:00
  Filled 2018-03-31: qty 1

## 2018-03-31 MED ORDER — PREDNISONE 20 MG PO TABS: ORAL_TABLET | ORAL | 0 refills | Status: AC

## 2018-03-31 NOTE — Progress Notes (Signed)
   NAME:  Felicia [name], MRN:  [id number], DOB:  Jan 24, 1971, LOS: 4 ADMISSION DATE:  03/27/2018, CONSULTATION DATE:  03/27/2018  REFERRING MD:  Dr Florene Glen, CHIEF COMPLAINT: Shortness of breath  Brief History   Patient presented with shortness of breath of 4-5 days duration Recent URI symptoms Background history of NSIP She recently had 2 courses of rituximab-was discontinued Has been on CellCept Has pulmonary hypertension Diastolic heart failure On very high flow oxygen at baseline with increased requirement in the last few days She is generally feeling better, oxygen requirement improving  Past Medical History  Diabetes type 2, NSIP, hypertension, SVT, pulmonary hypertension Pulmonary hypertension  Significant Hospital Events   2/18 Shortness of breath, tachypnea, on a nonrebreather at present.  Placed on broad-spectrum antibiotics, IV diuresis, high flow oxygen, and systemic steroids 2/19: Feeling some better.  -1.2 L with diuresis.  Transitioning to high flow from nonrebreather 2/20-improvement in shortness of breath, improved oxygen requirement 2/22-stabilizing, still desaturates very easily with coughing spasms  Consults:  PCCM 03/27/2018  Procedures:    Significant Diagnostic Tests:  Chest x-ray with bibasal haziness, cardiomegaly [facility] 2018 the RV is severely enlarged and hypokinetic artery moderately enlarged RVSP of 70 with dilated IVC normal LV. An echo in October 2016 showed normal LV with mild LVH normal left atrial size RV then was mildly enlarged but had normal function and RVSP of 27 Pulmonary function test January 2019 FVC 1.25 L 36% predicted, FEV1 1.18 L 40% predicted with DLCO of 17  Micro Data:  Influenza a and B by PCR negative Viral panel -negative Antimicrobials:  Received vancomycin and cefepime Vancomycin 2/18>>>2/20 Cefepime 2/18>>>2/20 levaquin 2/20>>>  Interim history/subjective:  Feels better. Feels  she is stabilizing  Objective   Blood pressure 127/83, pulse (!) 50, temperature (!) 97.5 F (36.4 C), temperature source Oral, resp. rate 18, height _0  (1.651 m), weight 101.6 kg, SpO2 98 %.        Intake/Output Summary (Last 24 hours) at 03/31/2018 0937 Last data filed at 03/31/2018 0200 Gross per 24 hour  Intake 840 ml  Output 2500 ml  Net -1660 ml   Filed Weights   03/28/18 0628 03/29/18 0500 03/30/18 0500  Weight: 104.5 kg 92.8 kg 101.6 kg    Examination: General 48 year old aaf.  Resting comfortably, no acute distress HENT moist oral mucosa pulm decreased bases.  Rales at the bases Card S1-S2 appreciated abd nontender Ext no edema brisk CR   Echocardiogram from 02/28/2018 More prominent right ventricular dysfunction, normal left ventricular systolic function, multiple valvulopathy Right ventricular pressure of 53  Assessment & Plan:  Acute on chronic respiratory failure secondary to exacerbation of NSIP versus pneumonia (NOS) -Cultures negative, influenza negative, respiratory viral panel negative Plan Cont supplemental oxygen Levaquin to complete 7 days in total Cont lasix/diuresis as tolerated Cont pred 50m bid for 2 more days then 260mdaily F/u w/ me on march 3 at [id number] Diastolic heart failure Plan Cont lasix as above   History of supraventricular tachycardia Plan Cont BB as above  Pulmonary hypertension -Pulmonary hypertension is severe -Has been tolerating tadalafil Plan Cont rx   Diabetes Plan ssi   I will follow-up as outpatient Planned possible discharge today

## 2018-03-31 NOTE — Care Management (Signed)
NCM spoke to pt and she is requesting additional oxymizers for home. [facility] and they delivered 3 new oxymizers to her home on yesterday. Unit RN will update pt. Pt has her oxygen with [facility] and has HF oxygen. Has appt to see pulmonologist on 3/3. Jonnie Finner RN CCM Case Mgmt phone [phone]

## 2018-03-31 NOTE — Discharge Summary (Signed)
Physician Discharge Summary  Felicia [name] VVO:[id number] DOB: 08-Feb-1970 DOA: 03/27/2018  PCP: Aretta Nip, MD  Admit date: 03/27/2018 Discharge date: 03/31/2018  Admitted From: Home Disposition:  Home  Recommendations for Outpatient Follow-up:  1. Follow up with PCP in 1 week 2. Please obtain BMP in one week 3. Follow-up with [facility] as scheduled   Home Health: No Equipment/Devices: oxygen, BiPAP, additional Oxymizer  Discharge Condition: Stable, but requires high levels of supplemental oxygen 10-15 L/min CODE STATUS: Full code Diet recommendation: Heart Healthy  History of present illness:  Felicia [name] a 48 y.o.femalewith medical history significant ofNSIP on 10-15 L at home, pulmonary hypertension, HFpEF, hypertension, T2DM, SVTpresents with worsening shortness of breath.   She notes that her symptoms started about 3 weeks ago when she had a sinus infection.Treated with azithro and doxy.She states that she has been on not return to her baseline yet. Last 5 days things have gotten progressively worse. She is noticed progressive shortness of breath, cough with mucus, and fatigue. She notes subjective fevers and chills. She notes chest pain only with her cough. She notes nausea with the cough. She denies any abdominal pain or vomiting. She uses 10 L at home typically, but recently she has been adding an additional 5 L with a second nasal cannula. She denies smoking or drinking. She denies sick contacts, but did see the cardiologist last week and the pulmonologist within the past month.   ED Course:Labs, CXR, steroids, abx, lasix. Admited for acute on chronic hypoxic respiratory failure.  Hospital course:  Acute on chronic hypoxic respiratory failure Hx nonspecific interstitial lung disease Community-acquired pneumonia Patient presenting with progressive shortness of breath, history of nonspecific interstitial lung  disease on home oxygen 10-15 L/min at baseline.  Follows with [facility] for her underlying pulmonary hypertension.  Started on broad-spectrum antibiotics with vancomycin and cefepime initially for concern of infectious etiology; as well as IV steroids.  CT pulmonary angiogram negative for pulmonary embolism but notable for severe pulmonary hypertension with alveolar opacity consistent with fibrosis with multiple enlarged lymph nodes that is stable since previous exams.  Pulmonology was consulted during the hospital course in which she continued.  Initially she was started on broad-spectrum antibiotics with vancomycin and cefepime. HIV was nonreactive, strep pneumo antigen was negative, rapid influenza negative, BNP 416.  She was started on IV steroids, supported with nebs and BiPAP as needed.  Patient's dyspnea improved and she was titrated to oral prednisone which she will continue to taper for an additional 2 days and continue a 20 mg dose daily.  She will also complete antibiotic course with Levaquin 750 mg p.o. daily for additional 3 days.  An additional oxygen concentrator and BiPAP were ordered and delivered to her home.  Patient is to follow-up with [facility] following discharge as scheduled.  HFpEF Severe Pulmonary Hypertension: Echo normal EF with mild LVH, mild RV systolic dysfunction (see care everywhere - 02/28/2018). PFT's Jan 2019 w/ FVC 1.25 L 36% predicted, FEV1 1.18 L 40% predicted with DLCO of 17. Follows at [facility] for pulmonary hypertension, on tadalafil. Considering inhaled prostacyclin at [facility].  Patient was diuresed initially with IV Lasix 40 mg twice daily and will continue on Lasix 40 mg p.o. twice daily following discharge. Continue tadalafil 62m PO BID, mycophenolate 1508mPO BID. [facility] considering starting Tyvaso. [facility]  Hypertension: Her home hydrochlorothiazide was  discontinued in favor  of furosemide 40 mg p.o. twice daily.  Continue verapamil and atenolol at home doses.  Sinus Tachycardia Hx SVT: Etiology likely secondary todue to respiratory issues above, resolved with treatment/diuresis. Continue AV nodal blockers with verapamil and atenolol.  T2DM:  Hemoglobin A1c 6.3 on 02/03/2017.  Holding home metformin.  Glucose elevated likely secondary to side effect of IV steroids.  Patient was treated with insulin during her hospitalization, recommend resume home metformin following discharge.  Recommend carbohydrate consistent/heart healthy diet.  Hypokalemia:  Resolved, continue to monitor electrolytes.  Recommend repeat BMP at PCP visit to assess potassium level.  QT prolongation: caution with qt prolonging meds  Given her severe pulmonary hypertension and interstitial lung disease requiring high flow nasal cannula up to 10-15 L/min, patient is very high bounce back potential for decompensation.  Patient during this hospitalization has been optimized to the greatest potential and back to her previous baseline, but overall her condition is guarded.  Discharge Diagnoses:  Active Problems:   PSVT (paroxysmal supraventricular tachycardia) (HCC)   Essential hypertension   On home oxygen therapy   Chronic diastolic HF (heart failure) (HCC)   Pulmonary HTN (HCC)   ILD (interstitial lung disease) ([place])   CAP (community acquired pneumonia)    Discharge Instructions  Discharge Instructions    Call MD for:  difficulty breathing, headache or visual disturbances   Complete by:  As directed    Call MD for:  temperature >100.4   Complete by:  As directed    Diet - low sodium heart healthy   Complete by:  As directed    Discharge instructions   Complete by:  As directed    Follow-up with PCP in 1 week Follow-up with [facility] as scheduled Follow-up with [facility]ology as scheduled   Increase activity slowly   Complete by:  As directed       Allergies as of 03/31/2018   No Known Allergies     Medication List    STOP taking these medications   hydrochlorothiazide 25 MG tablet Commonly known as:  HYDRODIURIL   Potassium Chloride ER 20 MEQ Tbcr     TAKE these medications   albuterol 108 (90 Base) MCG/ACT inhaler Commonly known as:  PROVENTIL HFA;VENTOLIN HFA Inhale 1 puff into the lungs every 6 (six) hours as needed for wheezing or shortness of breath.   atenolol 50 MG tablet Commonly known as:  TENORMIN Take 75 mg by mouth 2 (two) times daily.   fluticasone 50 MCG/ACT nasal spray Commonly known as:  FLONASE Place 2 sprays into both nostrils daily as needed for allergies or rhinitis.   furosemide 40 MG tablet Commonly known as:  LASIX Take 1 tablet (40 mg total) by mouth 2 (two) times daily. What changed:    how much to take  how to take this  when to take this  additional instructions   HYDROcodone-homatropine 5-1.5 MG/5ML syrup Commonly known as:  HYCODAN Take 5 mLs by mouth every 4 (four) hours as needed for cough.   ibuprofen 200 MG tablet Commonly known as:  ADVIL,MOTRIN Take 200 mg by mouth every 6 (six) hours as needed for headache or mild pain.   ipratropium-albuterol 0.5-2.5 (3) MG/3ML Soln Commonly known as:  DUONEB Inhale 3 mLs into the lungs every 6 (six) hours as needed (sob and wheezing).   levofloxacin 750 MG tablet Commonly known as:  LEVAQUIN Take 1 tablet (750 mg total) by mouth daily for 3 days.   metFORMIN 500 MG tablet  Commonly known as:  GLUCOPHAGE Take 500 mg by mouth 2 (two) times daily with a meal.   MUCINEX 600 MG 12 hr tablet Generic drug:  guaiFENesin Take 600 mg by mouth daily as needed for to loosen phlegm.   mycophenolate 500 MG tablet Commonly known as:  CELLCEPT Take 1,500 mg by mouth 2 (two) times daily. 3 tablets (1500 mg) bid   predniSONE 20 MG tablet Commonly known as:  DELTASONE Take 1 tablet (20 mg total) by mouth 2 (two) times daily for 2  days, THEN 1 tablet (20 mg total) daily for 30 days. Start taking on:  March 31, 2018 What changed:    See the new instructions.  Another medication with the same name was removed. Continue taking this medication, and follow the directions you see here.   tadalafil 20 MG tablet Commonly known as:  ADCIRCA/CIALIS Take 20 mg by mouth daily as needed for erectile dysfunction.   verapamil 120 MG CR tablet Commonly known as:  CALAN-SR Take 1 tablet (120 mg total) by mouth at bedtime. What changed:  when to take this            Durable Medical Equipment  (From admission, onward)         Start     Ordered   03/31/18 1033  For home use only DME Other see comment  Once    Comments:  oximizer   03/31/18 1034   03/28/18 1129  For home use only DME Bipap  Once     03/28/18 1135         Follow-up Information    Laurin Coder, MD Follow up on 04/10/2018.   Specialty:  Pulmonary Disease Why:  245 pm Contact information: [address] [place] [address] [phone]        Aretta Nip, MD. Schedule an appointment as soon as possible for a visit in 1 week(s).   Specialty:  Family Medicine Contact information: [place] [address] [phone]          No Known Allergies  Consultations:  Pulmonology, Dr. [name], MD.   Procedures/Studies: Ct Angio Chest Pe W Or Wo Contrast  Result Date: 03/27/2018 CLINICAL DATA:  Shortness of breath EXAM: CT ANGIOGRAPHY CHEST WITH CONTRAST TECHNIQUE: Multidetector CT imaging of the chest was performed using the standard protocol during bolus administration of intravenous contrast. Multiplanar CT image reconstructions and MIPs were obtained to evaluate the vascular anatomy. CONTRAST:  30m ISOVUE-370 IOPAMIDOL (ISOVUE-370) INJECTION 76% COMPARISON:  Chest CT February 03, 2017 and chest radiograph March 27, 2018 FINDINGS: Cardiovascular: There is no demonstrable pulmonary embolus.  There is no thoracic aortic aneurysm or dissection. Visualized great vessels appear normal. There is no pericardial effusion or pericardial thickening. Heart remains enlarged. There is prominence of the main pulmonary outflow tract measuring 3.5 cm. Mediastinum/Nodes: Thyroid appears unremarkable. There are enlarged lymph nodes to the left of the aortic arch. The largest of these lymph nodes measures 2.4 x 1.9 cm. A nearby lymph node in this area measures 1.8 x 1.5 cm. There is an aortopulmonary window lymph node measuring 1.8 x 1.2 cm. There is a lymph node in the right hilum measuring 2.4 x 1.4 cm. There is a subcarinal lymph node measuring 1.3 x 1.3 cm. No esophageal lesions are evident. Lungs/Pleura: There is widespread alveolar opacity throughout the lungs, similar to previous study from 2018. There are areas of underlying fibrosis with scattered bullae throughout the  lungs. There is a slight degree of subpleural sparing. The overall appearance is similar to the prior study from 2018. No pleural effusion noted. Upper Abdomen: There is reflux of contrast into the inferior vena cava and hepatic veins. Visualized upper abdominal structures otherwise appear unremarkable. Musculoskeletal: There are no blastic or lytic bone lesions. No chest wall lesions are evident. Review of the MIP images confirms the above findings. IMPRESSION: 1. No demonstrable pulmonary embolus. No thoracic aortic aneurysm or dissection. 2. Prominence of the main pulmonary outflow tract, a finding indicative of pulmonary arterial hypertension. 3. Widespread alveolar opacity with underlying fibrosis and relative subpleural sparing. This appearance is similar to the prior study from December 2018 and is consistent with reported diagnosis of nonspecific interstitial pneumonitis. No evident new opacity. 4. Multiple enlarged lymph nodes, a finding also present on previous study. Question reactive adenopathy given the extensive abnormality in the  lung parenchyma. 5. Reflux of contrast into the inferior vena cava and hepatic veins, a finding likely indicative of a degree of increase in right heart pressure. Electronically Signed   By: Lowella Grip III M.D.   On: 03/27/2018 09:55   Dg Chest Port 1 View  Result Date: 03/27/2018 CLINICAL DATA:  Fever and shortness of breath EXAM: PORTABLE CHEST 1 VIEW COMPARISON:  02/02/2017 FINDINGS: Chronic lung disease. Chart history of NSIP. Expected low lung volumes. No appreciable change in pulmonary opacity. Stable prominent heart size accentuated by technique. No effusion or air leak. IMPRESSION: Interstitial lung disease. No acute finding when compared to prior but extensive baseline opacity could easily obscure infection. Electronically Signed   By: Monte Fantasia M.D.   On: 03/27/2018 07:53      Subjective: Patient resting comfortably in bed, states ready discharge home today.  States breathing back to her regular baseline.  No other complaints at this time.  Denies chest pain, no significant shortness of breath from her baseline, no fever/chills/night sweats, no palpitations, no abdominal pain, no weakness, no cough/congestion, no paresthesias.  No acute events overnight per nursing staff.   Discharge Exam: Vitals:   03/31/18 0959 03/31/18 1002  BP: (!) 108/50 (!) 108/50  Pulse:  84  Resp:    Temp:    SpO2:     Vitals:   03/31/18 0745 03/31/18 0757 03/31/18 0959 03/31/18 1002  BP:   (!) 108/50 (!) 108/50  Pulse:    84  Resp:      Temp: (!) 97.5 F (36.4 C)     TempSrc: Oral     SpO2:  98%    Weight:      Height:        General: Pt is alert, awake, not in acute distress Cardiovascular: RRR, S1/S2 +, no rubs, no gallops Respiratory: CTA bilaterally, no wheezing, no rhonchi, on 15 L high flow nasal cannula (baseline) Abdominal: Soft, NT, ND, bowel sounds + Extremities: no edema, no cyanosis    The results of significant diagnostics from this hospitalization (including  imaging, microbiology, ancillary and laboratory) are listed below for reference.     Microbiology: Recent Results (from the past 240 hour(s))  Respiratory Panel by PCR     Status: None   Collection Time: 03/27/18  6:06 AM  Result Value Ref Range Status   Adenovirus NOT DETECTED NOT DETECTED Final   Coronavirus 229E NOT DETECTED NOT DETECTED Final    Comment: (NOTE) The Coronavirus on the Respiratory Panel, DOES NOT test for the novel  Coronavirus (2019 nCoV)  Coronavirus HKU1 NOT DETECTED NOT DETECTED Final   Coronavirus NL63 NOT DETECTED NOT DETECTED Final   Coronavirus OC43 NOT DETECTED NOT DETECTED Final   Metapneumovirus NOT DETECTED NOT DETECTED Final   Rhinovirus / Enterovirus NOT DETECTED NOT DETECTED Final   Influenza A NOT DETECTED NOT DETECTED Final   Influenza B NOT DETECTED NOT DETECTED Final   Parainfluenza Virus 1 NOT DETECTED NOT DETECTED Final   Parainfluenza Virus 2 NOT DETECTED NOT DETECTED Final   Parainfluenza Virus 3 NOT DETECTED NOT DETECTED Final   Parainfluenza Virus 4 NOT DETECTED NOT DETECTED Final   Respiratory Syncytial Virus NOT DETECTED NOT DETECTED Final   Bordetella pertussis NOT DETECTED NOT DETECTED Final   Chlamydophila pneumoniae NOT DETECTED NOT DETECTED Final   Mycoplasma pneumoniae NOT DETECTED NOT DETECTED Final    Comment: Performed at [facility], [place] [address], [place], [place] [address]  Culture, blood (routine x 2)     Status: None (Preliminary result)   Collection Time: 03/27/18  9:14 AM  Result Value Ref Range Status   Specimen Description   Final    BLOOD LEFT ANTECUBITAL Performed at [facility], [place] [address], [place], [place] [address]    Special Requests   Final    BOTTLES DRAWN AEROBIC AND ANAEROBIC Blood Culture adequate volume Performed at [facility] [address] [place], [place] [address]    Culture NO GROWTH 3 DAYS  Final   Report Status PENDING  Incomplete  Culture, blood  (routine x 2)     Status: None (Preliminary result)   Collection Time: 03/27/18  9:14 AM  Result Value Ref Range Status   Specimen Description BLOOD RIGHT ARM  Final   Special Requests   Final    BOTTLES DRAWN AEROBIC AND ANAEROBIC Blood Culture adequate volume Performed at [facility] [address], [place], [place] [address]    Culture NO GROWTH 3 DAYS  Final   Report Status PENDING  Incomplete  MRSA PCR Screening     Status: None   Collection Time: 03/27/18 10:29 AM  Result Value Ref Range Status   MRSA by PCR NEGATIVE NEGATIVE Final    Comment:        The GeneXpert MRSA Assay (FDA approved for NASAL specimens only), is one component of a comprehensive MRSA colonization surveillance program. It is not intended to diagnose MRSA infection nor to guide or monitor treatment for MRSA infections. Performed at [facility], [place] [address], [place], [place] [address]   Culture, sputum-assessment     Status: None   Collection Time: 03/30/18  9:50 PM  Result Value Ref Range Status   Specimen Description SPUTUM  Final   Special Requests NONE  Final   Sputum evaluation   Final    THIS SPECIMEN IS ACCEPTABLE FOR SPUTUM CULTURE Performed at [facility], [place] [address] [place], [place] [address]    Report Status 03/31/2018 FINAL  Final  Culture, respiratory     Status: None (Preliminary result)   Collection Time: 03/30/18  9:50 PM  Result Value Ref Range Status   Specimen Description SPUTUM  Final   Special Requests   Final    NONE Reflexed from [id number] Performed at [facility], [place] [address] [place], [place] [address]    Gram Stain RARE WBC PRESENT, PREDOMINANTLY PMN YEAST   Final   Culture PENDING  Incomplete   Report Status PENDING  Incomplete     Labs: BNP (last 3 results) Recent Labs    03/27/18 0601  BNP 466.5*   Basic Metabolic Panel: Recent Labs  Lab 03/27/18 0606 03/28/18 0328  03/29/18 0313 03/30/18 0312 03/31/18 0248  NA 139 140 139 137 136  K 3.3* 3.6 3.6 3.8 4.4  CL 101 101 100 98 95*  CO2 _0 32  GLUCOSE 159* 205* 194* 193* 227*  BUN 9 13 27* 34* 25*  CREATININE 0.80 0.97 1.03* 0.94 0.87  CALCIUM 8.7* 8.5* 8.7* 8.6* 8.1*  MG 1.7  --  2.0 2.3  --    Liver Function Tests: Recent Labs  Lab 03/27/18 0606 03/28/18 0328  AST 23 22  ALT 14 16  ALKPHOS 54 52  BILITOT 0.9 1.2  PROT 7.3 7.5  ALBUMIN 3.6 3.6   No results for input(s): LIPASE, AMYLASE in the last 168 hours. No results for input(s): AMMONIA in the last 168 hours. CBC: Recent Labs  Lab 03/27/18 0601 03/28/18 0328 03/29/18 0313 03/30/18 0312 03/31/18 0248  WBC 11.6* 7.9 14.3* 12.5* 11.4*  HGB 11.7* 11.5* 11.7* 11.7* 11.8*  HCT 41.9 41.0 41.9 41.8 42.6  MCV 84.8 84.5 85.3 84.8 86.1  PLT 314 329 337 298 273   Cardiac Enzymes: No results for input(s): CKTOTAL, CKMB, CKMBINDEX, TROPONINI in the last 168 hours. BNP: Invalid input(s): POCBNP CBG: Recent Labs  Lab 03/30/18 0741 03/30/18 1209 03/30/18 1643 03/30/18 2131 03/31/18 0804  GLUCAP 184* 230* 231* 214* 156*   D-Dimer No results for input(s): DDIMER in the last 72 hours. Hgb A1c No results for input(s): HGBA1C in the last 72 hours. Lipid Profile No results for input(s): CHOL, HDL, LDLCALC, TRIG, CHOLHDL, LDLDIRECT in the last 72 hours. Thyroid function studies No results for input(s): TSH, T4TOTAL, T3FREE, THYROIDAB in the last 72 hours.  Invalid input(s): FREET3 Anemia work up No results for input(s): VITAMINB12, FOLATE, FERRITIN, TIBC, IRON, RETICCTPCT in the last 72 hours. Urinalysis    Component Value Date/Time   COLORURINE YELLOW 12/24/2008 1604   APPEARANCEUR CLOUDY (A) 12/24/2008 1604   LABSPEC 1.019 12/24/2008 1604   PHURINE 7.5 12/24/2008 1604   GLUCOSEU NEGATIVE 12/24/2008 1604   HGBUR NEGATIVE 12/24/2008 1604   BILIRUBINUR NEGATIVE 12/24/2008 1604   KETONESUR NEGATIVE 12/24/2008 1604    PROTEINUR NEGATIVE 12/24/2008 1604   UROBILINOGEN 0.2 12/24/2008 1604   NITRITE NEGATIVE 12/24/2008 1604   LEUKOCYTESUR MODERATE (A) 12/24/2008 1604   Sepsis Labs Invalid input(s): PROCALCITONIN,  WBC,  LACTICIDVEN Microbiology Recent Results (from the past 240 hour(s))  Respiratory Panel by PCR     Status: None   Collection Time: 03/27/18  6:06 AM  Result Value Ref Range Status   Adenovirus NOT DETECTED NOT DETECTED Final   Coronavirus 229E NOT DETECTED NOT DETECTED Final    Comment: (NOTE) The Coronavirus on the Respiratory Panel, DOES NOT test for the novel  Coronavirus (2019 nCoV)    Coronavirus HKU1 NOT DETECTED NOT DETECTED Final   Coronavirus NL63 NOT DETECTED NOT DETECTED Final   Coronavirus OC43 NOT DETECTED NOT DETECTED Final   Metapneumovirus NOT DETECTED NOT DETECTED Final   Rhinovirus / Enterovirus NOT DETECTED NOT DETECTED Final   Influenza A NOT DETECTED NOT DETECTED Final   Influenza B NOT DETECTED NOT DETECTED Final   Parainfluenza Virus 1 NOT DETECTED NOT DETECTED Final   Parainfluenza Virus 2 NOT DETECTED NOT DETECTED Final   Parainfluenza Virus 3 NOT DETECTED NOT DETECTED Final  Parainfluenza Virus 4 NOT DETECTED NOT DETECTED Final   Respiratory Syncytial Virus NOT DETECTED NOT DETECTED Final   Bordetella pertussis NOT DETECTED NOT DETECTED Final   Chlamydophila pneumoniae NOT DETECTED NOT DETECTED Final   Mycoplasma pneumoniae NOT DETECTED NOT DETECTED Final    Comment: Performed at [facility], [place] [address] [place], [place] [address]  Culture, blood (routine x 2)     Status: None (Preliminary result)   Collection Time: 03/27/18  9:14 AM  Result Value Ref Range Status   Specimen Description   Final    BLOOD LEFT ANTECUBITAL Performed at [facility], [place] [address], [place], [place] [address]    Special Requests   Final    BOTTLES DRAWN AEROBIC AND ANAEROBIC Blood Culture adequate volume Performed at [facility] [address], [place], [place] [address]    Culture NO GROWTH 3 DAYS  Final   Report Status PENDING  Incomplete  Culture, blood (routine x 2)     Status: None (Preliminary result)   Collection Time: 03/27/18  9:14 AM  Result Value Ref Range Status   Specimen Description BLOOD RIGHT ARM  Final   Special Requests   Final    BOTTLES DRAWN AEROBIC AND ANAEROBIC Blood Culture adequate volume Performed at [facility] [address] [place], [place] [address]    Culture NO GROWTH 3 DAYS  Final   Report Status PENDING  Incomplete  MRSA PCR Screening     Status: None   Collection Time: 03/27/18 10:29 AM  Result Value Ref Range Status   MRSA by PCR NEGATIVE NEGATIVE Final    Comment:        The GeneXpert MRSA Assay (FDA approved for NASAL specimens only), is one component of a comprehensive MRSA colonization surveillance program. It is not intended to diagnose MRSA infection nor to guide or monitor treatment for MRSA infections. Performed at [facility], [place] [address] [place], [place] [address]   Culture, sputum-assessment     Status: None   Collection Time: 03/30/18  9:50 PM  Result Value Ref Range Status   Specimen Description SPUTUM  Final   Special Requests NONE  Final   Sputum evaluation   Final    THIS SPECIMEN IS ACCEPTABLE FOR SPUTUM CULTURE Performed at [facility], [place] [address], [place], [place] [address]    Report Status 03/31/2018 FINAL  Final  Culture, respiratory     Status: None (Preliminary result)   Collection Time: 03/30/18  9:50 PM  Result Value Ref Range Status   Specimen Description SPUTUM  Final   Special Requests   Final    NONE Reflexed from [id number] Performed at [facility], [place] [address] [place], [place] [address]    Gram Stain RARE WBC PRESENT, PREDOMINANTLY PMN YEAST   Final   Culture PENDING  Incomplete   Report Status PENDING  Incomplete     Time coordinating  discharge: Over 30 minutes  SIGNED:   Lunden Mcleish J [place] DO  [facility] 03/31/2018, 10:56 AM

## 2018-04-01 LAB — CULTURE, BLOOD (ROUTINE X 2)
Culture: NO GROWTH
Culture: NO GROWTH
Special Requests: ADEQUATE
Special Requests: ADEQUATE

## 2018-04-02 LAB — CULTURE, RESPIRATORY W GRAM STAIN

## 2018-04-02 LAB — CULTURE, RESPIRATORY

## 2018-04-05 ENCOUNTER — Encounter: Payer: Self-pay | Admitting: Pulmonary Disease

## 2018-04-05 ENCOUNTER — Ambulatory Visit (INDEPENDENT_AMBULATORY_CARE_PROVIDER_SITE_OTHER): Payer: 59 | Admitting: Pulmonary Disease

## 2018-04-05 VITALS — BP 130/72 | HR 50 | Ht 65.0 in | Wt 224.0 lb

## 2018-04-05 DIAGNOSIS — R059 Cough, unspecified: Secondary | ICD-10-CM

## 2018-04-05 DIAGNOSIS — R05 Cough: Secondary | ICD-10-CM | POA: Diagnosis not present

## 2018-04-05 NOTE — Progress Notes (Signed)
Felicia [name]    [id number]    April 16, 1970  Primary Care Physician:[name], Felicia Salinas, MD  Referring Physician: Aretta [name], Felicia [name], Felicia [name] [id number]  Chief complaint:   Patient in for follow-up of pulmonary fibrosis HPI: She was recently treated in the hospital for acute on chronic respiratory failure History of NSIP, history of pulmonary hypertension Follows up at [facility]  She had recently had a URI for which she had a course of antibiotics She was treated with a course of Levaquin while in the hospital She has continued to improve with oxygen requirement improving  She is on CellCept, prednisone  She has diastolic heart failure, Recently failed rituximab  Oxygen requirement at 10 L-maintaining saturations greater than 90% She is trying to work on a muscle strength   Outpatient Encounter Medications as of 04/05/2018  Medication Sig  . albuterol (PROVENTIL HFA;VENTOLIN HFA) 108 (90 Base) MCG/ACT inhaler Inhale 1 puff into the lungs every 6 (six) hours as needed for wheezing or shortness of breath.  [name] atenolol (TENORMIN) 50 MG tablet Take 75 mg by mouth 2 (two) times daily.   . fluticasone (FLONASE) 50 MCG/ACT nasal spray Place 2 sprays into both nostrils daily as needed for allergies or rhinitis.  . furosemide (LASIX) 40 MG tablet Take 1 tablet (40 mg total) by mouth 2 (two) times daily.  [name] guaiFENesin (MUCINEX) 600 MG 12 hr tablet Take 600 mg by mouth daily as needed for to loosen phlegm.  [name] HYDROcodone-homatropine (HYCODAN) 5-1.5 MG/5ML syrup Take 5 mLs by mouth every 4 (four) hours as needed for cough.  [name] ibuprofen (ADVIL,MOTRIN) 200 MG tablet Take 200 mg by mouth every 6 (six) hours as needed for headache or mild pain.  [name] ipratropium-albuterol (DUONEB) 0.5-2.5 (3) MG/3ML SOLN Inhale 3 mLs into the lungs every 6 (six) hours as needed (sob and wheezing).   . metFORMIN (GLUCOPHAGE) 500 MG tablet Take 500 mg by mouth 2 (two) times  daily with a meal.   . mycophenolate (CELLCEPT) 500 MG tablet Take 1,500 mg by mouth 2 (two) times daily. 3 tablets (1500 mg) bid  . predniSONE (DELTASONE) 20 MG tablet Take 1 tablet (20 mg total) by mouth 2 (two) times daily for 2 days, THEN 1 tablet (20 mg total) daily for 30 days.  . tadalafil (ADCIRCA/CIALIS) 20 MG tablet Take 20 mg by mouth daily as needed for erectile dysfunction.  . verapamil (CALAN-SR) 120 MG CR tablet Take 1 tablet (120 mg total) by mouth at bedtime. (Patient taking differently: Take 120 mg by mouth every morning. )   No facility-administered encounter medications on file as of 04/05/2018.     Allergies as of 04/05/2018  . (No Known Allergies)    Past Medical History:  Diagnosis Date  . Diabetes mellitus without complication ([place])    type 2   . Hypertension   . NSIP (nonspecific interstitial pneumonitis) ([place])   . SVT (supraventricular tachycardia) ([facility])     Past Surgical History:  Procedure Laterality Date  . CESAREAN SECTION    . LUNG BIOPSY      Family History  Problem Relation Age of Onset  . Hypertension Mother   . Cancer Father   . Diabetes Father   . Hypertension Father   . Diabetes Sister   . Mental retardation Sister   . Cancer Maternal Grandmother   . Cancer Maternal Grandfather   . Cancer Paternal Grandmother   .  Cancer Paternal Grandfather     Social History   Socioeconomic History  . Marital status: Single    Spouse name: Not on file  . Number of children: Not on file  . Years of education: Not on file  . Highest education level: Not on file  Occupational History  . Not on file  Social Needs  . Financial resource strain: Not on file  . Food insecurity:    Worry: Not on file    Inability: Not on file  . Transportation needs:    Medical: Not on file    Non-medical: Not on file  Tobacco Use  . Smoking status: Never Smoker  . Smokeless tobacco: Never Used  . Tobacco comment: never smoked or used tobacco products    Substance and Sexual Activity  . Alcohol use: No  . Drug use: No  . Sexual activity: Not on file  Lifestyle  . Physical activity:    Days per week: Not on file    Minutes per session: Not on file  . Stress: Not on file  Relationships  . Social connections:    Talks on phone: Not on file    Gets together: Not on file    Attends religious service: Not on file    Active member of club or organization: Not on file    Attends meetings of clubs or organizations: Not on file    Relationship status: Not on file  . Intimate partner violence:    Fear of current or ex partner: Not on file    Emotionally abused: Not on file    Physically abused: Not on file    Forced sexual activity: Not on file  Other Topics Concern  . Not on file  Social History Narrative   One daugther.  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      Review of Systems  Constitutional: Positive for fatigue.  HENT:       She does have occasional nosebleeds, sinus congestion  Eyes: Negative.   Respiratory: Positive for cough and shortness of breath.   Cardiovascular: Negative.   Gastrointestinal: Negative.     Vitals:   04/05/18 1623  BP: 130/72  Pulse: (!) 50  SpO2: 94%     Physical Exam  Constitutional: She appears well-developed.  HENT:  Head: Normocephalic and atraumatic.  Eyes: Pupils are equal, round, and reactive to light. Conjunctivae and EOM are normal. Right eye exhibits no discharge. Left eye exhibits no discharge.  Neck: Normal range of motion. Neck supple. No tracheal deviation present.  Cardiovascular: Normal rate and regular rhythm.  Pulmonary/Chest: Effort normal and breath sounds normal. No respiratory distress. She has no wheezes. She has no rales. She exhibits no tenderness.  Abdominal: Soft. Bowel sounds are normal. She exhibits no distension. There is no abdominal tenderness. There is no rebound.   Data Reviewed: Hospital records reviewed Patient CT scan reviewed with her showing progressive  groundglass changes  Assessment:  NSIP Acute on chronic respiratory failure Deconditioning Pulmonary hypertension-severe  Plan/Recommendations: Continue current lines of care Encouraged increase physical activity Encouraged to make sure she follows up at [facility] for pulmonary fibrosis  She has been evaluated for Tyvaso for pulmonary hypertension  Continue oxygen supplementation maintain saturations greater than 89%  Increase activity as tolerated Weight loss efforts encouraged  We will see her back in the office in about 6 weeks Saline nasal spray for dryness and nosebleeds  Sherrilyn Rist MD [facility] 04/05/2018, 4:55 PM  CC: [name], Felicia Salinas, MD

## 2018-04-05 NOTE — Patient Instructions (Signed)
Pulmonary fibrosis, NSIP  Continue oxygen supplementation Follow-up after [facility] with fibrosis clinic Follow-up with pulmonary hypertension clinic  I will see you back in about 6 weeks  Call with significant concerns Saline nasal spray for nasal stuffiness congestion and bleeding

## 2018-04-09 ENCOUNTER — Telehealth: Payer: Self-pay | Admitting: Pulmonary Disease

## 2018-04-09 DIAGNOSIS — R0609 Other forms of dyspnea: Secondary | ICD-10-CM

## 2018-04-09 DIAGNOSIS — G4733 Obstructive sleep apnea (adult) (pediatric): Secondary | ICD-10-CM

## 2018-04-09 DIAGNOSIS — Z9981 Dependence on supplemental oxygen: Secondary | ICD-10-CM

## 2018-04-09 NOTE — Telephone Encounter (Signed)
Patient states that she would like a the home sleep/inlab.She used a bipap in the hospital and found benefit to this. She also would like to know how long she may be on lasix and if she needs potassium supplement.  She is aware Dr. [name]  Is in hospital will call once he responds.

## 2018-04-10 ENCOUNTER — Institutional Professional Consult (permissible substitution): Payer: 59 | Admitting: Pulmonary Disease

## 2018-04-10 NOTE — Telephone Encounter (Signed)
She should continue on Lasix 40 mg daily  Not everybody who is on Lasix requires potassium supplementation-we have to be careful that she is not on potassium supplementation leading to high potassium levels  Order BMP  Because of her oxygen requirement -She cannot have a home sleep study performed -She has to have an in lab polysomnogram

## 2018-04-10 NOTE — Telephone Encounter (Signed)
Left message for patient to call back.

## 2018-04-11 ENCOUNTER — Telehealth: Payer: Self-pay | Admitting: Pulmonary Disease

## 2018-04-11 NOTE — Telephone Encounter (Signed)
Pt is returning call CB# [phone]//[phone]

## 2018-04-11 NOTE — Telephone Encounter (Signed)
LMTCB x1 for pt.  

## 2018-04-11 NOTE — Telephone Encounter (Signed)
Called and spoke with patient regarding AO recommendations Informed the patient of results and recommendations today. Placed inlab sleep study today, verbally okayed by AO in clinic today Placed BMP labs today Advised patient to come in office for labs this week. Pt verbalized understanding and denied any questions or concerns at this time.  Nothing further needed.

## 2018-04-13 NOTE — Telephone Encounter (Signed)
Called and spoke with Patient. Patient stated she would come next week for lab.  Office lab hours given to Patient, with lunch time given.  Patient stated understanding.  Nothing further at this time.

## 2018-04-18 ENCOUNTER — Other Ambulatory Visit: Payer: Self-pay | Admitting: Pulmonary Disease

## 2018-04-18 DIAGNOSIS — G4733 Obstructive sleep apnea (adult) (pediatric): Secondary | ICD-10-CM

## 2018-04-25 ENCOUNTER — Telehealth: Payer: Self-pay | Admitting: Pulmonary Disease

## 2018-04-25 NOTE — Telephone Encounter (Signed)
Called and spoke with Patient.  Patient stated her VM is not working, so she did not receive a message.  Patient stated [facility] pulmonary number showed on phone. No recent labs, or procedure results found.  Patient does have a home sleep study ordered, but not scheduled.  There are no notes found in epic.

## 2018-05-08 ENCOUNTER — Encounter (HOSPITAL_BASED_OUTPATIENT_CLINIC_OR_DEPARTMENT_OTHER): Payer: 59

## 2018-05-10 IMAGING — CR DG CHEST 2V
2 series · 2 of 2 positions shown · non-contrast
Comparison: October 01, 2012

CLINICAL DATA: Shortness of Breath

EXAM:
CHEST  2 VIEW

[w chest pa]
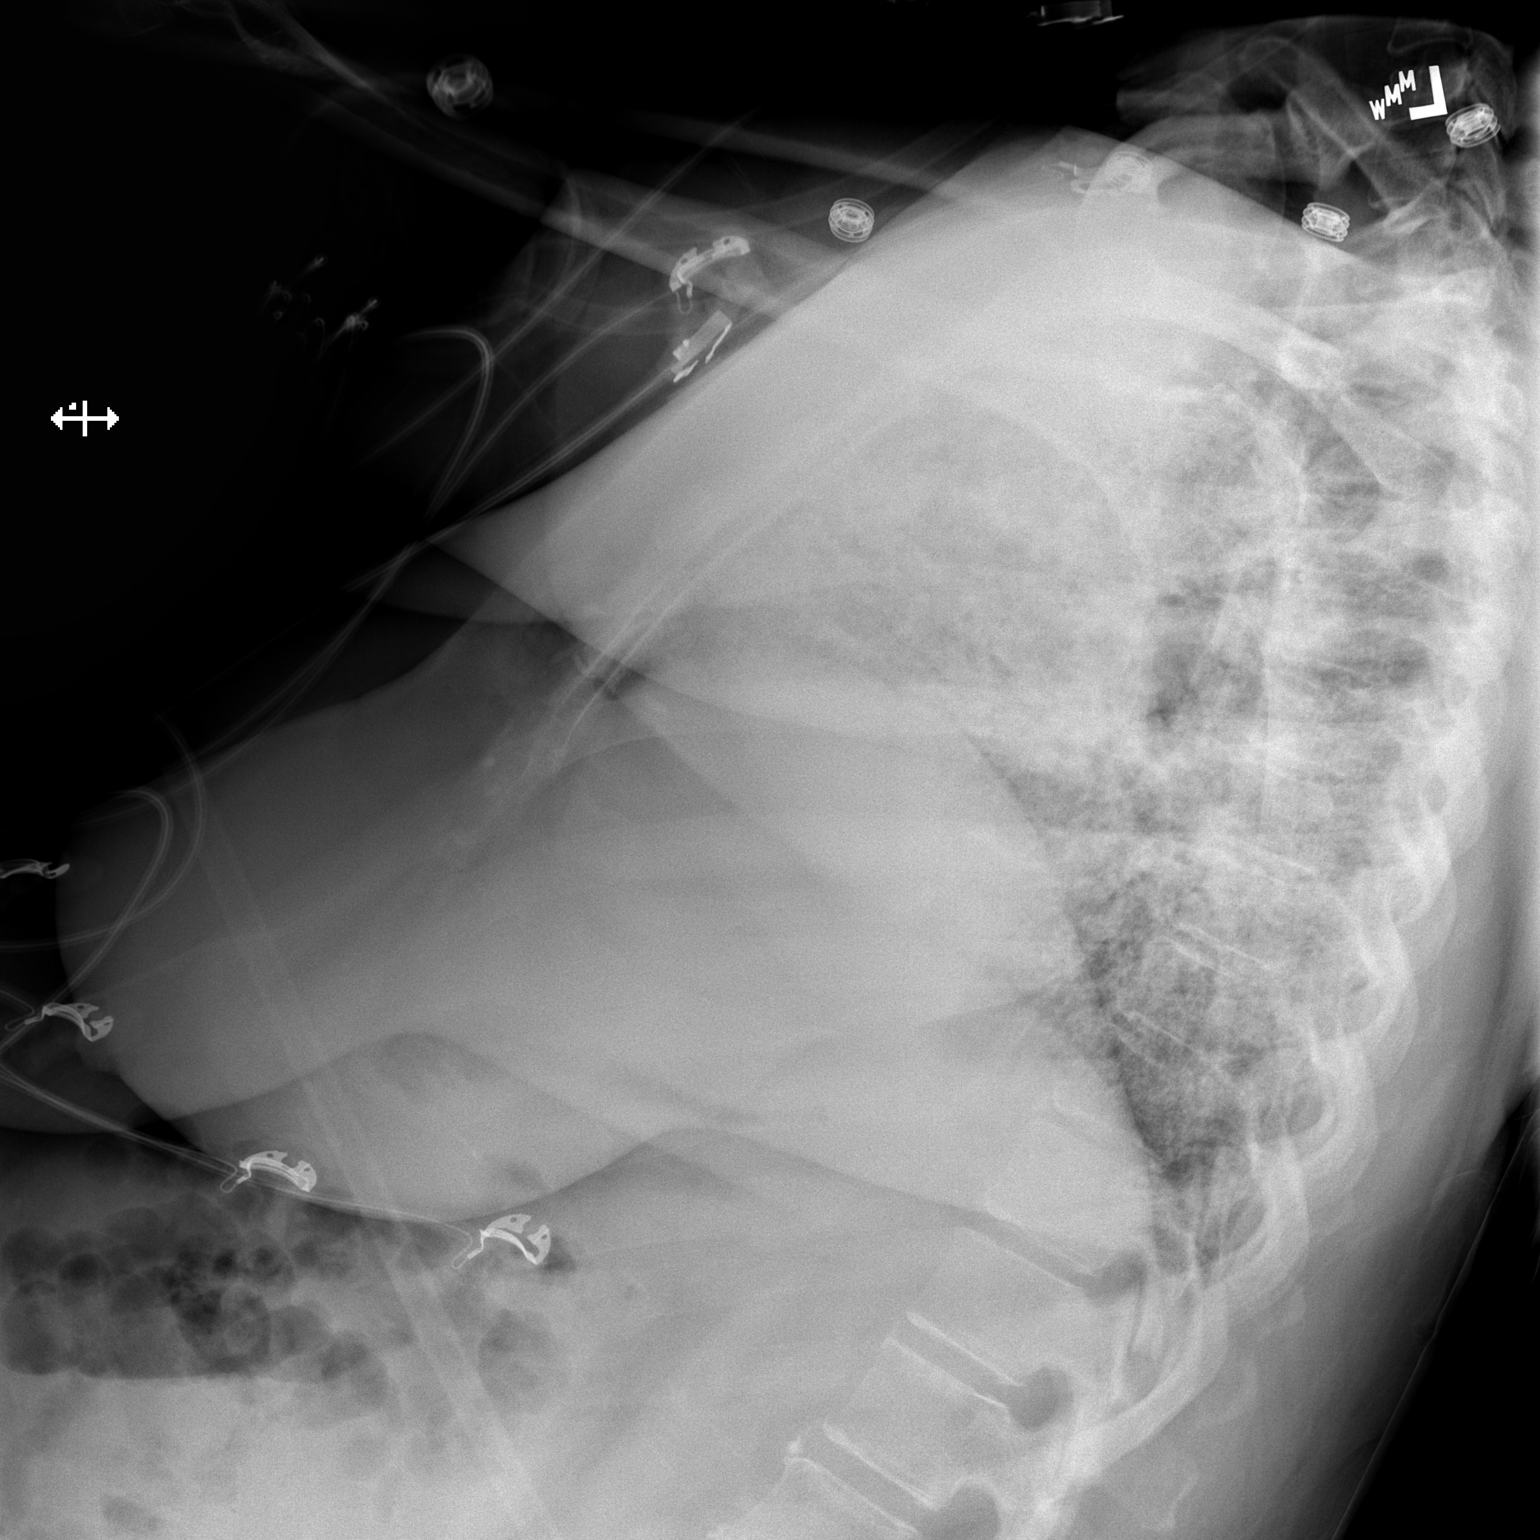

[x chest ap]
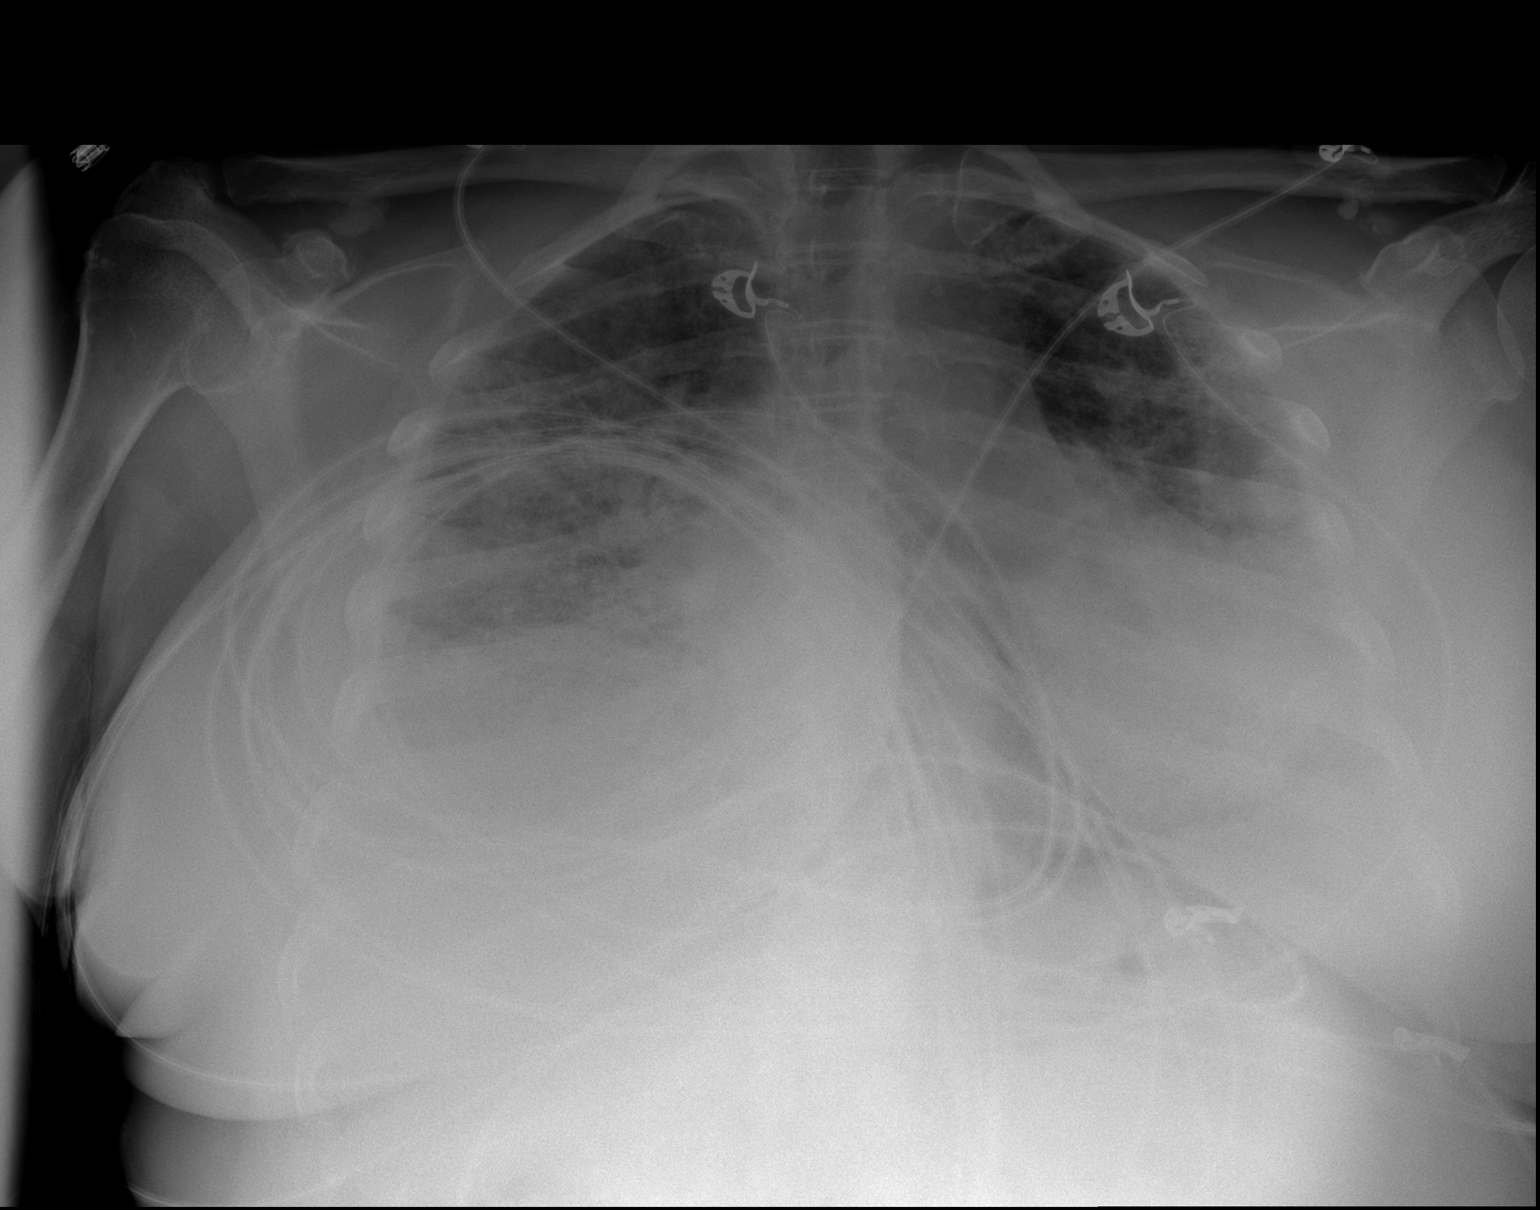

[2 of 2 positions shown; findings below may reference images not displayed]

FINDINGS: There is cardiomegaly with pulmonary venous hypertension. There is
widespread interstitial thickening with patchy areas of
consolidation in the bases. No evident adenopathy. No bone lesions.
IMPRESSION: Diffuse, chronic interstitial thickening, consistent with chronic
interstitial lung disease. Patchy opacity in the bases may represent
chronic atelectasis or potential superimposed pneumonia.

There is cardiomegaly with mild pulmonary venous hypertension. There
is felt to be a degree of underlying pulmonary vascular congestion.

## 2018-05-11 ENCOUNTER — Ambulatory Visit: Payer: 59 | Admitting: Pulmonary Disease

## 2018-06-12 ENCOUNTER — Ambulatory Visit: Payer: 59

## 2018-06-12 ENCOUNTER — Other Ambulatory Visit: Payer: Self-pay

## 2018-06-12 DIAGNOSIS — G4733 Obstructive sleep apnea (adult) (pediatric): Secondary | ICD-10-CM

## 2018-06-13 ENCOUNTER — Telehealth: Payer: Self-pay | Admitting: Pulmonary Disease

## 2018-06-13 DIAGNOSIS — G4733 Obstructive sleep apnea (adult) (pediatric): Secondary | ICD-10-CM

## 2018-06-13 NOTE — Telephone Encounter (Signed)
Pt came in yesterday to pick up a HST from [place].  Pt is on 10 liters of O2 & will need to complete an in-lab study.  This will require a new order for an in-lab sleep study.  It looks like this was ordered originally, got denied by insurance which then had [place] ordering the HST.  Please order an in-lab sleep study for the patient.   Thanks a bunch, [employer]

## 2018-06-13 NOTE — Telephone Encounter (Signed)
AO, please advise if you are ok with ordering an in lab study. Thanks!

## 2018-06-14 NOTE — Telephone Encounter (Signed)
Order has been changed to in lab study.

## 2018-06-14 NOTE — Telephone Encounter (Signed)
Patient should have an in-lab study if on 10L oxygen

## 2018-07-26 ENCOUNTER — Other Ambulatory Visit (HOSPITAL_COMMUNITY): Admission: RE | Admit: 2018-07-26 | Payer: 59 | Source: Ambulatory Visit

## 2018-07-29 ENCOUNTER — Encounter (HOSPITAL_BASED_OUTPATIENT_CLINIC_OR_DEPARTMENT_OTHER): Payer: 59

## 2018-08-16 ENCOUNTER — Other Ambulatory Visit (HOSPITAL_COMMUNITY): Payer: 59

## 2018-08-17 ENCOUNTER — Other Ambulatory Visit (HOSPITAL_COMMUNITY): Payer: 59

## 2018-08-19 ENCOUNTER — Encounter (HOSPITAL_BASED_OUTPATIENT_CLINIC_OR_DEPARTMENT_OTHER): Payer: 59

## 2018-08-28 ENCOUNTER — Telehealth: Payer: Self-pay | Admitting: Pulmonary Disease

## 2018-08-28 NOTE — Telephone Encounter (Signed)
Spoke with Kia at the sleep center. She stated that Felicia [name] (sleep lab technician) had reviewed the patient's chart and saw that the patient requires 8-15L of O2 at night. She stated that because of this, will be hard to get an accurate reading of her true O2 levels since she is using so much O2.   She wants to know if AO wants to proceed with the test. Advised her that I would send a message to him. She verbalized understanding.   AO, please advise. Thanks!

## 2018-08-28 NOTE — Telephone Encounter (Signed)
Proceed with test

## 2018-08-29 NOTE — Telephone Encounter (Signed)
Lynnae Sandhoff can call me [phone] or you can get me his number

## 2018-08-29 NOTE — Telephone Encounter (Signed)
I called the Sleep Center to speak with Kia but she was unavailable I spoke with Lynnae Sandhoff and he wants to speak with Dr. [name] about this. AO please advise.

## 2018-08-30 ENCOUNTER — Telehealth: Payer: Self-pay | Admitting: Pulmonary Disease

## 2018-08-30 NOTE — Telephone Encounter (Signed)
Lynnae Sandhoff called back and I gave him Dr. Judson Roch number. Nothing further is needed.

## 2018-08-30 NOTE — Telephone Encounter (Signed)
Called Lynnae Sandhoff, no answer. LM to CB so I can give him AO's number. Will await return call.

## 2018-08-30 NOTE — Telephone Encounter (Signed)
Can we schedule her to come in for a follow up visit  Spoke with sleep lab and may be best if we see her first before further decision regarding a study

## 2018-08-30 NOTE — Telephone Encounter (Signed)
Ok called pt and made an appt for 09/24/2018 at 10:15 with AO. Nothing further is needed.

## 2018-08-31 ENCOUNTER — Other Ambulatory Visit (HOSPITAL_COMMUNITY): Payer: 59

## 2018-09-03 ENCOUNTER — Encounter (HOSPITAL_BASED_OUTPATIENT_CLINIC_OR_DEPARTMENT_OTHER): Payer: 59

## 2018-09-24 ENCOUNTER — Other Ambulatory Visit: Payer: Self-pay

## 2018-09-24 ENCOUNTER — Ambulatory Visit (INDEPENDENT_AMBULATORY_CARE_PROVIDER_SITE_OTHER): Payer: 59 | Admitting: Pulmonary Disease

## 2018-09-24 VITALS — BP 112/82

## 2018-09-24 DIAGNOSIS — J8489 Other specified interstitial pulmonary diseases: Secondary | ICD-10-CM

## 2018-09-24 DIAGNOSIS — Z9981 Dependence on supplemental oxygen: Secondary | ICD-10-CM | POA: Diagnosis not present

## 2018-09-24 NOTE — Patient Instructions (Signed)
Pulmonary fibrosis Pulmonary hypertension  Continue with Lasix-once a day, may increase to twice a day if significant weight gain that cannot be explained for just a couple of days Graded exercises as tolerated  Continue oxygen supplementation  I will see you back in the office in about 3 months  Call with significant concerns

## 2018-09-24 NOTE — Progress Notes (Signed)
Felicia [name]    [id number]    1970-08-19  Primary Care Physician:Rankins, Bill Salinas, MD  Referring Physician: Aretta [name], Felicia [name],  Felicia [name] Felicia [name]  Chief complaint:    Follow-up of pulmonary fibrosis and pulmonary hypertension  HPI:  Has been stable Has not been hospitalized recently  History of NSIP, history of pulmonary hypertension  Follow[facility] up at [name]  No recent exacerbation Trying to get more active  Recently started on Tyvaso Did receive infusions of rituximab x2  Oxygen requirement is unchanged  Outpatient Encounter Medications as of 09/24/2018  Medication Sig  . albuterol (Felicia [name]) 108 (90 Base) MCG/ACT inhaler Inhale 1 puff into the lungs every 6 (six) hours as needed for wheezing or shortness of breath.  [name] atenolol (TENORMIN) 50 MG tablet Take 75 mg by mouth 2 (two) times daily.   . fluticasone (FLONASE) 50 MCG/ACT nasal spray Place 2 sprays into both nostrils daily as needed for allergies or rhinitis.  . furosemide (Felicia [name]) 40 MG tablet Take 1 tablet (40 mg total) by mouth 2 (two) times daily.  [name] guaiFENesin (MUCINEX) 600 MG 12 hr tablet Take 600 mg by mouth daily as needed for to loosen phlegm.  [name] HYDROcodone-homatropine (HYCODAN) 5-1.5 MG/5ML syrup Take 5 mLs by mouth every 4 (four) hours as needed for cough.  [name] ibuprofen (ADVIL,MOTRIN) 200 MG tablet Take 200 mg by mouth every 6 (six) hours as needed for headache or mild pain.  [name] ipratropium-albuterol (DUONEB) 0.5-2.5 (3) MG/3ML SOLN Inhale 3 mLs into the lungs every 6 (six) hours as needed (sob and wheezing).   . metFORMIN (GLUCOPHAGE) 500 MG tablet Take 500 mg by mouth 2 (two) times daily with a meal.   . Felicia [name] (Felicia [name]) 500 MG tablet Take 1,500 mg by mouth 2 (two) times daily. 3 tablets (1500 mg) bid  . tadalafil (ADCIRCA/CIALIS) 20 MG tablet Take 20 mg by mouth daily as needed for erectile dysfunction.  . verapamil (Felicia [name]) 120 MG  CR tablet Take 1 tablet (120 mg total) by mouth at bedtime. (Patient taking differently: Take 120 mg by mouth every morning. )   No facility-administered encounter medications on file as of 09/24/2018.     Allergies as of 09/24/2018  . (No Known Allergies)    Past Medical History:  Diagnosis Date  . Diabetes mellitus without complication (Felicia [name])    type 2   . Hypertension   . NSIP (nonspecific interstitial pneumonitis) (Felicia [name])   . SVT (supraventricular tachycardia) (Felicia [name])     Past Surgical History:  Procedure Laterality Date  . CESAREAN SECTION    . LUNG BIOPSY      Family History  Problem Relation Age of Onset  . Hypertension Mother   . Cancer Father   . Diabetes Father   . Hypertension Father   . Diabetes Sister   . Mental retardation Sister   . Cancer Maternal Grandmother   . Cancer Maternal Felicia [name]   . Cancer Paternal Grandmother   . Cancer Paternal Felicia [name]     Social History   Socioeconomic History  . Marital status: Single    Spouse name: Not on file  . Number of children: Not on file  . Years of education: Not on file  . Highest education level: Not on file  Occupational History  . Not on file  Social Needs  . Financial resource strain: Not on file  . Food insecurity    Worry:  Not on file    Inability: Not on file  . Transportation needs    Medical: Not on file    Non-medical: Not on file  Tobacco Use  . Smoking status: Never Smoker  . Smokeless tobacco: Never Used  . Tobacco comment: never smoked or used tobacco products  Substance and Sexual Activity  . Alcohol use: No  . Drug use: No  . Sexual activity: Not on file  Lifestyle  . Physical activity    Days per week: Not on file    Minutes per session: Not on file  . Stress: Not on file  Relationships  . Social Herbalist on phone: Not on file    Gets together: Not on file    Attends religious service: Not on file    Active member of club or organization: Not on file     Attends meetings of clubs or organizations: Not on file    Relationship status: Not on file  . Intimate partner violence    Fear of current or ex partner: Not on file    Emotionally abused: Not on file    Physically abused: Not on file    Forced sexual activity: Not on file  Other Topics Concern  . Not on file  Social History Narrative   One daugther.  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      Review of Systems  Constitutional: Positive for fatigue.  HENT:       She does have occasional nosebleeds, sinus congestion  Eyes: Negative.   Respiratory: Positive for shortness of breath. Negative for wheezing.   Cardiovascular: Negative.   Gastrointestinal: Negative.     Vitals:   09/24/18 1049  BP: 112/82  SpO2: 93%     Physical Exam  Constitutional: She appears well-developed and well-nourished.  HENT:  Head: Normocephalic and atraumatic.  Eyes: Pupils are equal, round, and reactive to light. Conjunctivae and EOM are normal. Right eye exhibits no discharge. Left eye exhibits no discharge.  Neck: Normal range of motion. Neck supple. No tracheal deviation present.  Cardiovascular: Normal rate and regular rhythm.  Pulmonary/Chest: Effort normal and breath sounds normal. No respiratory distress. She has no wheezes. She has no rales. She exhibits no tenderness.  Abdominal: Soft. Bowel sounds are normal. She exhibits no distension. There is no abdominal tenderness. There is no rebound.  Musculoskeletal:     Comments: [place]: Hospital records reviewed CT reviewed  Assessment:   NSIP Acute on chronic respiratory failure Deconditioning Pulmonary hypertension-severe -Recently on Tyvaso -Symptoms appear controlled at present   Plan/Recommendations:  Continue lines of care Encouraged to continue to work on increasing physical activity We will continue to follow-up at [name] for pulmonary fibrosis and pulmonary hypertension  Continue oxygen supplementation   We will not pursue a sleep study at present Follow bicarb ABG if worsening fatigue or any increase in her bicarb  I will see her back in the office in about 3 months    Sherrilyn Rist MD [name] 09/24/2018, 11:11 AM  CC: Rankins, Bill Salinas, MD

## 2019-02-03 ENCOUNTER — Encounter (HOSPITAL_COMMUNITY): Payer: Self-pay

## 2019-02-03 ENCOUNTER — Emergency Department (HOSPITAL_COMMUNITY)
Admission: EM | Admit: 2019-02-03 | Discharge: 2019-02-04 | Disposition: A | Discharge status: Home / Self Care | Payer: 59 | Attending: Emergency Medicine | Admitting: Emergency Medicine

## 2019-02-03 ENCOUNTER — Other Ambulatory Visit: Payer: Self-pay

## 2019-02-03 DIAGNOSIS — R2241 Localized swelling, mass and lump, right lower limb: Secondary | ICD-10-CM | POA: Diagnosis not present

## 2019-02-03 DIAGNOSIS — I824Z1 Acute embolism and thrombosis of unspecified deep veins of right distal lower extremity: Secondary | ICD-10-CM | POA: Diagnosis not present

## 2019-02-03 DIAGNOSIS — R0602 Shortness of breath: Secondary | ICD-10-CM | POA: Insufficient documentation

## 2019-02-03 DIAGNOSIS — M7989 Other specified soft tissue disorders: Secondary | ICD-10-CM

## 2019-02-03 DIAGNOSIS — Z20828 Contact with and (suspected) exposure to other viral communicable diseases: Secondary | ICD-10-CM | POA: Diagnosis not present

## 2019-02-03 MED ORDER — ONDANSETRON HCL 4 MG/2ML IJ SOLN
4.0000 mg | Freq: Once | INTRAMUSCULAR | Status: AC
  Administered 2019-02-04: 4 mg via INTRAVENOUS
  Filled 2019-02-03: qty 2

## 2019-02-03 MED ORDER — FENTANYL CITRATE (PF) 100 MCG/2ML IJ SOLN
50.0000 ug | Freq: Once | INTRAMUSCULAR | Status: AC
  Administered 2019-02-04: 50 ug via INTRAVENOUS
  Filled 2019-02-03: qty 2

## 2019-02-03 NOTE — ED Provider Notes (Addendum)
TIME SEEN: 11:31 PM  CHIEF COMPLAINT: Right lower extremity swelling, shortness of breath  HPI: Patient is a 48 year old female with history of NSIP on cellcept and prednisone, pulmonary hypertension, Right HF, hypertension, diabetes who presents to the emergency department with 5 to 7 days of right lower extremity swelling.  Also reports increased shortness of breath and cough with yellow sputum production.  Is on 10 L of oxygen chronically at home.  She states she takes Lasix 20 mg once daily and over the past 2 to 3 days has increased this to 40 mg once daily without much relief.  Has also recently finished two azithromycin courses.  She denies any fevers.  No history of PE or DVT.  Chest pain only with coughing.  No known sick contacts.  Denies Covid exposures.  Patient followed by [facility] pulm for ILD.  Patient also reports a fall yesterday in the shower.  Has bruising to the right upper extremity.  Did not hit her head.  Not on blood thinners.  She declines x-rays at this time.  ROS: See HPI Constitutional: no fever  Eyes: no drainage  ENT: no runny nose   Cardiovascular:   chest pain with coughing Resp:  SOB  GI: no vomiting GU: no dysuria Integumentary: no rash  Allergy: no hives  Musculoskeletal: R leg swelling  Neurological: no slurred speech ROS otherwise negative  PAST MEDICAL HISTORY/PAST SURGICAL HISTORY:  Past Medical History:  Diagnosis Date  . Diabetes mellitus without complication ([place])    type 2   . Hypertension   . NSIP (nonspecific interstitial pneumonitis) ([place])   . SVT (supraventricular tachycardia) ([facility])     MEDICATIONS:  Prior to Admission medications   Medication Sig Start Date End Date Taking? Authorizing Provider  albuterol (PROVENTIL HFA;VENTOLIN HFA) 108 (90 Base) MCG/ACT inhaler Inhale 1 puff into the lungs every 6 (six) hours as needed for wheezing or shortness of breath.    [provider]  atenolol (TENORMIN) 50 MG tablet Take 75 mg by mouth  2 (two) times daily.  01/08/17   [provider]  fluticasone (FLONASE) 50 MCG/ACT nasal spray Place 2 sprays into both nostrils daily as needed for allergies or rhinitis.    [provider]  furosemide (LASIX) 40 MG tablet Take 1 tablet (40 mg total) by mouth 2 (two) times daily. 03/31/18   [place] Donnamarie Poag, DO  guaiFENesin (MUCINEX) 600 MG 12 hr tablet Take 600 mg by mouth daily as needed for to loosen phlegm.    [provider]  HYDROcodone-homatropine (HYCODAN) 5-1.5 MG/5ML syrup Take 5 mLs by mouth every 4 (four) hours as needed for cough. 03/31/18   [place] Donnamarie Poag, DO  ibuprofen (ADVIL,MOTRIN) 200 MG tablet Take 200 mg by mouth every 6 (six) hours as needed for headache or mild pain.    [provider]  ipratropium-albuterol (DUONEB) 0.5-2.5 (3) MG/3ML SOLN Inhale 3 mLs into the lungs every 6 (six) hours as needed (sob and wheezing).  01/08/17   [provider]  metFORMIN (GLUCOPHAGE) 500 MG tablet Take 500 mg by mouth 2 (two) times daily with a meal.     [provider]  mycophenolate (CELLCEPT) 500 MG tablet Take 1,500 mg by mouth 2 (two) times daily. 3 tablets (1500 mg) bid    [provider]  tadalafil (ADCIRCA/CIALIS) 20 MG tablet Take 20 mg by mouth daily as needed for erectile dysfunction.    [provider]  verapamil (CALAN-SR) 120 MG CR tablet  Take 1 tablet (120 mg total) by mouth at bedtime. Patient taking differently: Take 120 mg by mouth every morning.  09/23/14   Etta Quill, NP    ALLERGIES:  No Known Allergies  SOCIAL HISTORY:  Social History   Tobacco Use  . Smoking status: Never Smoker  . Smokeless tobacco: Never Used  . Tobacco comment: never smoked or used tobacco products  Substance Use Topics  . Alcohol use: No    FAMILY HISTORY: Family History  Problem Relation Age of Onset  . Hypertension Mother   . Cancer Father   . Diabetes Father   . Hypertension Father   . Diabetes Sister   .  Mental retardation Sister   . Cancer Maternal Grandmother   . Cancer Maternal Grandfather   . Cancer Paternal Grandmother   . Cancer Paternal Grandfather     EXAM: BP (!) 143/91 (BP Location: Left Arm)   Pulse 87   Temp 99 F (37.2 C) (Oral)   Resp (!) 26   Ht _0  (1.651 m)   Wt 107.5 kg   SpO2 98%   BMI 39.44 kg/m  CONSTITUTIONAL: Alert and oriented and responds appropriately to questions.  Chronically ill-appearing.  Afebrile.  NAD.  Nontoxic. HEAD: Normocephalic, atraumatic EYES: Conjunctivae clear, pupils appear equal, EOM appear intact ENT: normal nose; moist mucous membranes NECK: Supple, normal ROM no midline spinal tenderness or step-off or deformity CARD: Regular and minimally tachycardic; S1 and S2 appreciated; no murmurs, no clicks, no rubs, no gallops RESP: Normal chest excursion without splinting, patient is minimally tachypneic; breath sounds clear and equal bilaterally; no wheezes, no rhonchi, no rales, no hypoxia on her normal 10 L O2 or respiratory distress, speaking full sentences ABD/GI: Normal bowel sounds; non-distended; soft, non-tender, no rebound, no guarding, no peritoneal signs, no hepatosplenomegaly BACK:  The back appears normal, no midline spinal tenderness or step-off or deformity EXT: Normal ROM in all joints; no deformity noted, no pitting edema; no cyanosis; patient's right lower extremity is significantly swollen compared to the left, she has 2+ dopplerable DP pulses bilaterally, extremities are slightly cool to touch but no bruising, necrosis, redness, compartments are soft in the lower extremities bilaterally, no joint effusions noted, no bony tenderness or deformity; patient does have ecchymosis to the right humerus without deformity or bony tenderness SKIN: Normal color for age and race; warm; no rash on exposed skin NEURO: Moves all extremities equally PSYCH: The patient's mood and manner are appropriate.   MEDICAL DECISION MAKING: Patient here  with shortness of breath, right greater than left lower extremity swelling.  I am concerned about PE and DVT in this patient with risk factors.  At this time unable to obtain venous Doppler of the lower extremity.  We will start with a D-dimer.  We will also obtain troponin, BNP, chest x-ray.  EKG shows no ischemic change compared to previous.  Differential also includes CHF, pulm HTN, PNA, ILD.  ED PROGRESS: D-dimer elevated.  Will proceed with CTA of the chest.  First troponin is 22.  Second pending.   Second troponin stable at 21.  CT scan shows no pulmonary embolus at the central or lobar level but distal evaluation limited secondary to severe respiratory motion artifact.  She has interval progression of her chronic lung disease.  She is doing well on her chronic 10 L but has asked to be switched to mask for comfort.   Spoke with Dr. Sandi Mariscal with radiology who is reviewed patient's CT  scan.  He feels it is very unlikely that patient has a PE based on the scan.  He does not feel a VQ scan would add any extra information.  She has bad chronic underlying lung disease and is on immunosuppression and steroids chronically.  Will obtain venous Dopplers in the morning of both lower extremities.  Rapid Covid is pending.  Given patient has no increased WOB and no change in chronic O2 requirement, I feel she will likely be safe for dc home with close outpt follow up.  She is comfortable with this plan.  Recommend close f/u with her pulmonologist.  Rapid Covid is negative.  6-24 hour Covid test pending.  Venous Dopplers pending.  Signed out to oncoming ED physician Dr. Melina Copa to follow-up on Dopplers.  I reviewed all nursing notes and pertinent previous records as available.  I have interpreted any EKGs, lab and urine results, imaging (as available).    EKG Interpretation  Date/Time:  Sunday February 03 2019 23:23:50 EST Ventricular Rate:  89 PR Interval:    QRS Duration: 104 QT Interval:  405 QTC  Calculation: 447 R Axis:   108 Text Interpretation: Sinus rhythm Atrial premature complexes Right axis deviation Low voltage, precordial leads Borderline T abnormalities, diffuse leads No significant change since last tracing Confirmed by Pryor Curia [phone]) on 02/04/2019 12:57:51 AM       EKG Interpretation  Date/Time:  Monday February 04 2019 00:01:30 EST Ventricular Rate:  74 PR Interval:    QRS Duration: 108 QT Interval:  423 QTC Calculation: 470 R Axis:   108 Text Interpretation: Sinus rhythm Multiple premature complexes, vent & supraven Right axis deviation Low voltage, precordial leads Borderline T abnormalities, diffuse leads No significant change since last tracing Confirmed by Pryor Curia [phone]) on 02/04/2019 12:58:17 AM         Allen Norris was evaluated in Emergency Department on 02/03/2019 for the symptoms described in the history of present illness. She was evaluated in the context of the global COVID-19 pandemic, which necessitated consideration that the patient might be at risk for infection with the SARS-CoV-2 virus that causes COVID-19. Institutional protocols and algorithms that pertain to the evaluation of patients at risk for COVID-19 are in a state of rapid change based on information released by regulatory bodies including the CDC and federal and state organizations. These policies and algorithms were followed during the patient's care in the ED.  Patient was seen wearing N95, face shield, gloves.        Savreen Gebhardt, Delice Bison, DO 02/04/19 1101

## 2019-02-03 NOTE — ED Triage Notes (Signed)
Pt coming from home c/o right leg swollen x5 days and increase shortness of breath. Has been taking all medications (lasix etc) but not getting better. Also fell last shoulder and has bruising to right shoulder.  Baseline is 10 L of oxygen

## 2019-02-03 NOTE — ED Notes (Addendum)
Pt was at 10 L/min on her nasal cannula and felt like she wasn't getting enough oxygen. She was placed on a non-rebreather mask.

## 2019-02-04 ENCOUNTER — Emergency Department (HOSPITAL_BASED_OUTPATIENT_CLINIC_OR_DEPARTMENT_OTHER)
Admit: 2019-02-04 | Discharge: 2019-02-04 | Disposition: A | Discharge status: Home / Self Care | Payer: 59 | Attending: Emergency Medicine | Admitting: Emergency Medicine

## 2019-02-04 ENCOUNTER — Emergency Department (HOSPITAL_COMMUNITY): Payer: 59

## 2019-02-04 ENCOUNTER — Encounter (HOSPITAL_COMMUNITY): Payer: Self-pay

## 2019-02-04 DIAGNOSIS — R0602 Shortness of breath: Secondary | ICD-10-CM

## 2019-02-04 DIAGNOSIS — M7989 Other specified soft tissue disorders: Secondary | ICD-10-CM | POA: Diagnosis not present

## 2019-02-04 LAB — TROPONIN I (HIGH SENSITIVITY)
Troponin I (High Sensitivity): 21 ng/L — ABNORMAL HIGH (ref <18)
Troponin I (High Sensitivity): 22 ng/L — ABNORMAL HIGH (ref <18)

## 2019-02-04 LAB — CBC WITH DIFFERENTIAL/PLATELET
Abs Immature Granulocytes: 0.05 10*3/uL (ref 0.00–0.07)
Basophils Absolute: 0 10*3/uL (ref 0.0–0.1)
Basophils Relative: 0 %
Eosinophils Absolute: 0.1 10*3/uL (ref 0.0–0.5)
Eosinophils Relative: 1 %
HCT: 40 % (ref 36.0–46.0)
Hemoglobin: 11.3 g/dL — ABNORMAL LOW (ref 12.0–15.0)
Immature Granulocytes: 1 %
Lymphocytes Relative: 15 %
Lymphs Abs: 1.3 10*3/uL (ref 0.7–4.0)
MCH: 24.5 pg — ABNORMAL LOW (ref 26.0–34.0)
MCHC: 28.3 g/dL — ABNORMAL LOW (ref 30.0–36.0)
MCV: 86.8 fL (ref 80.0–100.0)
Monocytes Absolute: 1.1 10*3/uL — ABNORMAL HIGH (ref 0.1–1.0)
Monocytes Relative: 12 %
Neutro Abs: 6.4 10*3/uL (ref 1.7–7.7)
Neutrophils Relative %: 71 %
Platelets: 296 10*3/uL (ref 150–400)
RBC: 4.61 MIL/uL (ref 3.87–5.11)
RDW: 17.1 % — ABNORMAL HIGH (ref 11.5–15.5)
WBC: 8.8 10*3/uL (ref 4.0–10.5)
nRBC: 0 % (ref 0.0–0.2)

## 2019-02-04 LAB — I-STAT BETA HCG BLOOD, ED (MC, WL, AP ONLY): I-stat hCG, quantitative: 6.7 m[IU]/mL — ABNORMAL HIGH (ref <5)

## 2019-02-04 LAB — COMPREHENSIVE METABOLIC PANEL
ALT: 33 U/L (ref 0–44)
AST: 29 U/L (ref 15–41)
Albumin: 3.6 g/dL (ref 3.5–5.0)
Alkaline Phosphatase: 48 U/L (ref 38–126)
Anion gap: 14 (ref 5–15)
BUN: 21 mg/dL — ABNORMAL HIGH (ref 6–20)
CO2: 34 mmol/L — ABNORMAL HIGH (ref 22–32)
Calcium: 9.3 mg/dL (ref 8.9–10.3)
Chloride: 94 mmol/L — ABNORMAL LOW (ref 98–111)
Creatinine, Ser: 1.03 mg/dL — ABNORMAL HIGH (ref 0.44–1.00)
GFR calc Af Amer: >60 mL/min (ref >60)
GFR calc non Af Amer: >60 mL/min (ref >60)
Glucose, Bld: 169 mg/dL — ABNORMAL HIGH (ref 70–99)
Potassium: 3.7 mmol/L (ref 3.5–5.1)
Sodium: 142 mmol/L (ref 135–145)
Total Bilirubin: 0.6 mg/dL (ref 0.3–1.2)
Total Protein: 6.4 g/dL — ABNORMAL LOW (ref 6.5–8.1)

## 2019-02-04 LAB — HCG, SERUM, QUALITATIVE: Preg, Serum: NEGATIVE

## 2019-02-04 LAB — SARS CORONAVIRUS 2 (TAT 6-24 HRS): SARS Coronavirus 2: NEGATIVE

## 2019-02-04 LAB — D-DIMER, QUANTITATIVE: D-Dimer, Quant: 0.52 ug/mL-FEU — ABNORMAL HIGH (ref 0.00–0.50)

## 2019-02-04 LAB — BRAIN NATRIURETIC PEPTIDE: B Natriuretic Peptide: 718.1 pg/mL — ABNORMAL HIGH (ref 0.0–100.0)

## 2019-02-04 LAB — POC SARS CORONAVIRUS 2 AG -  ED: SARS Coronavirus 2 Ag: NEGATIVE

## 2019-02-04 MED ORDER — APIXABAN 5 MG PO TABS: ORAL_TABLET | ORAL | 0 refills | Status: DC

## 2019-02-04 MED ORDER — APIXABAN 5 MG PO TABS
10.0000 mg | ORAL_TABLET | Freq: Two times a day (BID) | ORAL | Status: DC
  Administered 2019-02-04: 10 mg via ORAL
  Filled 2019-02-04 (×2): qty 2

## 2019-02-04 MED ORDER — APIXABAN 5 MG PO TABS: 5.0000 mg | ORAL_TABLET | Freq: Two times a day (BID) | ORAL | Status: DC

## 2019-02-04 MED ORDER — SODIUM CHLORIDE (PF) 0.9 % IJ SOLN
INTRAMUSCULAR | Status: AC
  Filled 2019-02-04: qty 50

## 2019-02-04 MED ORDER — IOHEXOL 350 MG/ML SOLN
100.0000 mL | Freq: Once | INTRAVENOUS | Status: AC | PRN
  Administered 2019-02-04: 100 mL via INTRAVENOUS

## 2019-02-04 NOTE — Progress Notes (Signed)
ANTICOAGULATION CONSULT NOTE - Follow Up Consult  Pharmacy Consult for Eliquis Indication: DVT  No Known Allergies  Patient Measurements: Height: 5' 5" (165.1 cm) Weight: 237 lb (107.5 kg) IBW/kg (Calculated) : 57  Vital Signs: BP: 112/101 (12/28 0830) Pulse Rate: 93 (12/28 1000)  Labs: Recent Labs    02/03/19 2352 02/04/19 0136  HGB 11.3*  --   HCT 40.0  --   PLT 296  --   CREATININE 1.03*  --   TROPONINIHS 22* 21*    Estimated Creatinine Clearance: 80.5 mL/min (A) (by C-G formula based on SCr of 1.03 mg/dL (H)).  Assessment: 48 yo F presents with swollen R leg and SOB. Baseline oxygen is 10 L. Hgb 11.3, plts ok. Doppler shows acute DVT.   Goal of Therapy:  Monitor platelets by anticoagulation protocol: Yes   Plan:  Start Eliquis 20m PO BID x 7 days, then 539mPO BID  Monitor CBC, s/s of bleed  May discharge home from ED  [name] 02/04/2019,10:19 AM

## 2019-02-04 NOTE — Discharge Instructions (Addendum)
Information on my medicine - ELIQUIS (apixaban)  Why was Eliquis prescribed for you? Eliquis was prescribed to treat blood clots that may have been found in the veins of your legs (deep vein thrombosis) or in your lungs (pulmonary embolism) and to reduce the risk of them occurring again.  What do You need to know about Eliquis ? The starting dose is 10 mg (two 5 mg tablets) taken TWICE daily for the FIRST SEVEN (7) DAYS, then on 1/4 the dose is reduced to ONE 5 mg tablet taken TWICE daily.  Eliquis may be taken with or without food.   Try to take the dose about the same time in the morning and in the evening. If you have difficulty swallowing the tablet whole please discuss with your pharmacist how to take the medication safely.  Take Eliquis exactly as prescribed and DO NOT stop taking Eliquis without talking to the doctor who prescribed the medication.  Stopping may increase your risk of developing a new blood clot.  Refill your prescription before you run out.  After discharge, you should have regular check-up appointments with your healthcare provider that is prescribing your Eliquis.    What do you do if you miss a dose? If a dose of ELIQUIS is not taken at the scheduled time, take it as soon as possible on the same day and twice-daily administration should be resumed. The dose should not be doubled to make up for a missed dose.  Important Safety Information A possible side effect of Eliquis is bleeding. You should call your healthcare provider right away if you experience any of the following: ? Bleeding from an injury or your nose that does not stop. ? Unusual colored urine (red or dark brown) or unusual colored stools (red or black). ? Unusual bruising for unknown reasons. ? A serious fall or if you hit your head (even if there is no bleeding).  Some medicines may interact with Eliquis and might increase your risk of bleeding or clotting while on Eliquis. To help avoid  this, consult your healthcare provider or pharmacist prior to using any new prescription or non-prescription medications, including herbals, vitamins, non-steroidal anti-inflammatory drugs (NSAIDs) and supplements.  This website has more information on Eliquis (apixaban): http://www.eliquis.com/eliquis/home

## 2019-02-04 NOTE — ED Notes (Signed)
Discharge paperwork reviewed with pt, including prescriptions.  Pt reports that daughter is on her way to pick her up. Due to pts high O2 necessity, pt will wait in room until daughter arrives.  Pt will be wheeled to ED entrance via wheelchair to meet with ride and to switch to pts home O2 tank.

## 2019-02-04 NOTE — Progress Notes (Signed)
Bilateral lower extremity venous duplex completed. Refer to "CV Proc" under chart review to view preliminary results.  Preliminary results discussed with attending physician.  02/04/2019 10:23 AM Kelby Aline., MHA, RVT, RDCS, RDMS

## 2019-02-04 NOTE — ED Notes (Signed)
Patient transported to CT 

## 2019-02-04 NOTE — ED Notes (Signed)
Venous US at bedside.

## 2019-02-04 NOTE — ED Notes (Signed)
This RN called pharmacist to ensure pt provided verbal education regarding new eliquis prescription.  Pharmacist to provide education via telephone prior to discharge.

## 2019-02-04 NOTE — ED Notes (Signed)
Pt switched to high flow nasal cannula, per her request.  Pt remains on 15 L O2.  Pt provided with breakfast tray. Will continue to monitor.

## 2019-02-04 NOTE — ED Notes (Signed)
Pt wheeled out of ED to meet with daughter by NT.  Pt placed on home O2 tank prior to departure.

## 2019-02-04 NOTE — ED Provider Notes (Signed)
Signout from Dr. Leonides Schanz.  48 year old with history of lupus on chronic oxygen.  Here for evaluation of lower extremity swelling.  CT PE showing no definite PE.  She is pending a duplex of her lower extremities. Physical Exam  BP (!) 119/59   Pulse 78   Temp 99 F (37.2 C) (Oral)   Resp (!) 23   Ht _0  (1.651 m)   Wt 107.5 kg   SpO2 100%   BMI 39.44 kg/m   Physical Exam  ED Course/Procedures     Procedures  MDM  Plan is to follow-up on results of duplex.  If negative she can be discharged. If positive will need treatment.   Ultrasound tech is calling her study as probable tibial DVT on the right.  Left side look normal.  I reviewed this with the patient.  She is been requiring more oxygen here but she said she feels well enough to go home.  I have put a consult in for pharmacy to review the patient for anticoagulation for discharge.  Pharmacy recommending Eliquis and first dose given here.  She understands the need for close follow-up with her primary care doctor and to return if any worsening symptoms.     Hayden Rasmussen, MD 02/04/19 [phone]

## 2019-02-04 NOTE — ED Notes (Signed)
Pt replaced on NRB at 15LO2 d/t pts O2 sats decreasing to 83% on hi-flow nasal cannula.  Pt reports increased SHOB while on nasal cannula.  Will continue to monitor. EDP Melina Copa made aware.

## 2019-02-18 ENCOUNTER — Telehealth: Payer: Self-pay | Admitting: Pulmonary Disease

## 2019-02-18 NOTE — Telephone Encounter (Signed)
ATC again, she picked up and hung up again

## 2019-02-18 NOTE — Telephone Encounter (Signed)
Patient returning call - she can be reached btwn 12:15 and 1 or after 1:30 - at [phone] -pr

## 2019-02-18 NOTE — Telephone Encounter (Signed)
ATC, someone answered and then hung up, [facility]

## 2019-02-18 NOTE — Telephone Encounter (Signed)
Please leave her a message or call after 1:30 she is in a meeting but you can try her before then

## 2019-02-20 NOTE — Telephone Encounter (Signed)
Pt returning call able to be reached till 11:30

## 2019-02-20 NOTE — Telephone Encounter (Signed)
I called pt and she reported having sleeping issues during the night. According to her chart she was supposed to follow up with AO in 3 months. I advised her to make an appt to discuss with AO. Pt agreed, appt made for 03/03/2018 at 9:45. Nothing further is needed.

## 2019-02-22 ENCOUNTER — Other Ambulatory Visit: Payer: Self-pay

## 2019-02-22 ENCOUNTER — Encounter (HOSPITAL_COMMUNITY): Payer: Self-pay

## 2019-02-22 ENCOUNTER — Emergency Department (HOSPITAL_COMMUNITY): Payer: 59

## 2019-02-22 ENCOUNTER — Inpatient Hospital Stay (HOSPITAL_COMMUNITY)
Admission: EM | Admit: 2019-02-22 | Discharge: 2019-03-03 | DRG: 189 | Disposition: A | Discharge status: Home / Self Care | Payer: 59 | Attending: Pulmonary Disease | Admitting: Pulmonary Disease

## 2019-02-22 DIAGNOSIS — R0602 Shortness of breath: Secondary | ICD-10-CM | POA: Diagnosis not present

## 2019-02-22 DIAGNOSIS — Z7901 Long term (current) use of anticoagulants: Secondary | ICD-10-CM

## 2019-02-22 DIAGNOSIS — T380X5A Adverse effect of glucocorticoids and synthetic analogues, initial encounter: Secondary | ICD-10-CM | POA: Diagnosis present

## 2019-02-22 DIAGNOSIS — D84821 Immunodeficiency due to drugs: Secondary | ICD-10-CM | POA: Diagnosis present

## 2019-02-22 DIAGNOSIS — Z7984 Long term (current) use of oral hypoglycemic drugs: Secondary | ICD-10-CM

## 2019-02-22 DIAGNOSIS — I110 Hypertensive heart disease with heart failure: Secondary | ICD-10-CM | POA: Diagnosis present

## 2019-02-22 DIAGNOSIS — J9621 Acute and chronic respiratory failure with hypoxia: Secondary | ICD-10-CM | POA: Diagnosis not present

## 2019-02-22 DIAGNOSIS — Z9981 Dependence on supplemental oxygen: Secondary | ICD-10-CM

## 2019-02-22 DIAGNOSIS — Z8249 Family history of ischemic heart disease and other diseases of the circulatory system: Secondary | ICD-10-CM

## 2019-02-22 DIAGNOSIS — E669 Obesity, unspecified: Secondary | ICD-10-CM | POA: Diagnosis present

## 2019-02-22 DIAGNOSIS — Z6839 Body mass index (BMI) 39.0-39.9, adult: Secondary | ICD-10-CM

## 2019-02-22 DIAGNOSIS — Z79899 Other long term (current) drug therapy: Secondary | ICD-10-CM

## 2019-02-22 DIAGNOSIS — Z833 Family history of diabetes mellitus: Secondary | ICD-10-CM

## 2019-02-22 DIAGNOSIS — J9601 Acute respiratory failure with hypoxia: Secondary | ICD-10-CM | POA: Diagnosis present

## 2019-02-22 DIAGNOSIS — Z20822 Contact with and (suspected) exposure to covid-19: Secondary | ICD-10-CM | POA: Diagnosis present

## 2019-02-22 DIAGNOSIS — Z7952 Long term (current) use of systemic steroids: Secondary | ICD-10-CM

## 2019-02-22 DIAGNOSIS — J8489 Other specified interstitial pulmonary diseases: Secondary | ICD-10-CM | POA: Diagnosis present

## 2019-02-22 DIAGNOSIS — E1165 Type 2 diabetes mellitus with hyperglycemia: Secondary | ICD-10-CM | POA: Diagnosis not present

## 2019-02-22 DIAGNOSIS — I5032 Chronic diastolic (congestive) heart failure: Secondary | ICD-10-CM | POA: Diagnosis present

## 2019-02-22 DIAGNOSIS — I272 Pulmonary hypertension, unspecified: Secondary | ICD-10-CM | POA: Diagnosis present

## 2019-02-22 LAB — COMPREHENSIVE METABOLIC PANEL
ALT: 32 U/L (ref 0–44)
AST: 62 U/L — ABNORMAL HIGH (ref 15–41)
Albumin: 3.8 g/dL (ref 3.5–5.0)
Alkaline Phosphatase: 87 U/L (ref 38–126)
Anion gap: 14 (ref 5–15)
BUN: 16 mg/dL (ref 6–20)
CO2: 28 mmol/L (ref 22–32)
Calcium: 9.2 mg/dL (ref 8.9–10.3)
Chloride: 97 mmol/L — ABNORMAL LOW (ref 98–111)
Creatinine, Ser: 0.96 mg/dL (ref 0.44–1.00)
GFR calc Af Amer: >60 mL/min (ref >60)
GFR calc non Af Amer: >60 mL/min (ref >60)
Glucose, Bld: 160 mg/dL — ABNORMAL HIGH (ref 70–99)
Potassium: 3.8 mmol/L (ref 3.5–5.1)
Sodium: 139 mmol/L (ref 135–145)
Total Bilirubin: 0.5 mg/dL (ref 0.3–1.2)
Total Protein: 7 g/dL (ref 6.5–8.1)

## 2019-02-22 LAB — CBC WITH DIFFERENTIAL/PLATELET
Abs Immature Granulocytes: 0.14 10*3/uL — ABNORMAL HIGH (ref 0.00–0.07)
Basophils Absolute: 0 10*3/uL (ref 0.0–0.1)
Basophils Relative: 0 %
Eosinophils Absolute: 0.2 10*3/uL (ref 0.0–0.5)
Eosinophils Relative: 3 %
HCT: 41.5 % (ref 36.0–46.0)
Hemoglobin: 12 g/dL (ref 12.0–15.0)
Immature Granulocytes: 2 %
Lymphocytes Relative: 15 %
Lymphs Abs: 1.1 10*3/uL (ref 0.7–4.0)
MCH: 24.6 pg — ABNORMAL LOW (ref 26.0–34.0)
MCHC: 28.9 g/dL — ABNORMAL LOW (ref 30.0–36.0)
MCV: 85 fL (ref 80.0–100.0)
Monocytes Absolute: 0.9 10*3/uL (ref 0.1–1.0)
Monocytes Relative: 13 %
Neutro Abs: 4.6 10*3/uL (ref 1.7–7.7)
Neutrophils Relative %: 67 %
Platelets: 364 10*3/uL (ref 150–400)
RBC: 4.88 MIL/uL (ref 3.87–5.11)
RDW: 16.7 % — ABNORMAL HIGH (ref 11.5–15.5)
WBC: 6.8 10*3/uL (ref 4.0–10.5)
nRBC: 0.3 % — ABNORMAL HIGH (ref 0.0–0.2)

## 2019-02-22 LAB — BLOOD GAS, ARTERIAL
Acid-Base Excess: 1.4 mmol/L (ref 0.0–2.0)
Bicarbonate: 25.8 mmol/L (ref 20.0–28.0)
FIO2: 100
O2 Saturation: 96.7 %
Patient temperature: 97.7
pCO2 arterial: 41.2 mmHg (ref 32.0–48.0)
pH, Arterial: 7.41 (ref 7.350–7.450)
pO2, Arterial: 92 mmHg (ref 83.0–108.0)

## 2019-02-22 LAB — MAGNESIUM: Magnesium: 1.9 mg/dL (ref 1.7–2.4)

## 2019-02-22 LAB — POC SARS CORONAVIRUS 2 AG -  ED: SARS Coronavirus 2 Ag: NEGATIVE

## 2019-02-22 MED ORDER — PREDNISONE 20 MG PO TABS
60.0000 mg | ORAL_TABLET | Freq: Once | ORAL | Status: AC
  Administered 2019-02-22: 60 mg via ORAL
  Filled 2019-02-22: qty 3

## 2019-02-22 MED ORDER — IPRATROPIUM BROMIDE 0.02 % IN SOLN
0.5000 mg | Freq: Once | RESPIRATORY_TRACT | Status: AC
  Administered 2019-02-23: 0.5 mg via RESPIRATORY_TRACT
  Filled 2019-02-22: qty 2.5

## 2019-02-22 MED ORDER — APIXABAN 5 MG PO TABS
5.0000 mg | ORAL_TABLET | Freq: Two times a day (BID) | ORAL | Status: AC
  Administered 2019-02-22: 23:00:00 5 mg via ORAL
  Filled 2019-02-22: qty 1

## 2019-02-22 MED ORDER — ALBUTEROL SULFATE (2.5 MG/3ML) 0.083% IN NEBU
5.0000 mg | INHALATION_SOLUTION | Freq: Once | RESPIRATORY_TRACT | Status: AC
  Administered 2019-02-23: 5 mg via RESPIRATORY_TRACT
  Filled 2019-02-22: qty 6

## 2019-02-22 NOTE — ED Provider Notes (Signed)
[facility] DEPT Provider Note   CSN: [id number] Arrival date & time: 02/22/19  2144     History Chief Complaint  Patient presents with  . Shortness of Breath  . Leg Swelling    Felicia [name] is a 49 y.o. female.  49 y/o female with hx of NSIP (on Cellcept and chronic steroids), pulmonary HTN, dCHF, VTE (on chronic Eliquis), DM presents to the ED for c/o worsening SOB. Patient chronically on 10-15L supplemental O2 at baseline. Usually is able to walk short distances without issue. Notes increased DOE lately with lightheadness following prolonged exertion. Feels a tightness in her central chest with occasional "fluttering" at times when she has worsening dyspnea. Reports experiencing symptoms of a "sinus infection" x 7 months with cough productive of clear phlegm. Last course of abx was 2 weeks ago. Her doctor called her in an Rx for a Zpack a few days ago, but she has not yet started this. Notes compliance with her daily medications and blood thinners. Has noted some increased fluid retention in her BLE.  Asymmetric swelling in her RLE has improved since DVT diagnosis in December. Patient denies fever, syncope, chest pain, palpitations, vomiting, sick contacts.  Patient confirms status is FULL CODE  The history is provided by the patient. No language interpreter was used.  Shortness of Breath      Past Medical History:  Diagnosis Date  . Diabetes mellitus without complication ([place])    type 2   . Hypertension   . NSIP (nonspecific interstitial pneumonitis) ([place])   . SVT (supraventricular tachycardia) [name])     Patient Active Problem List   Diagnosis Date Noted  . CAP (community acquired pneumonia) 03/31/2018  . ILD (interstitial lung disease) ([place]) 03/27/2018  . Palpitations 03/20/2018  . Chronic diastolic HF (heart failure) ([place]) 03/21/2017  . Pulmonary HTN ([place]) 03/21/2017  . Medication management 03/21/2017  . NSIP (nonspecific  interstitial pneumonitis) ([place]) 02/03/2017  . Essential hypertension 02/03/2017  . Dyspnea on exertion   . On home oxygen therapy   . PSVT (paroxysmal supraventricular tachycardia) ([place]) 09/23/2014  . Interstitial pneumonitis ([place]) 09/23/2014  . UNSPEC ALVEOLAR&PARIETOALVEOLAR PNEUMONOPATHY 09/12/2008  . HYPERTENSION 09/11/2008  . BRONCHITIS 09/11/2008    Past Surgical History:  Procedure Laterality Date  . CESAREAN SECTION    . LUNG BIOPSY       OB History   No obstetric history on file.     Family History  Problem Relation Age of Onset  . Hypertension Mother   . Cancer Father   . Diabetes Father   . Hypertension Father   . Diabetes Sister   . Mental retardation Sister   . Cancer Maternal Grandmother   . Cancer Maternal Grandfather   . Cancer Paternal Grandmother   . Cancer Paternal Grandfather     Social History   Tobacco Use  . Smoking status: Never Smoker  . Smokeless tobacco: Never Used  . Tobacco comment: never smoked or used tobacco products  Substance Use Topics  . Alcohol use: No  . Drug use: No    Home Medications Prior to Admission medications   Medication Sig Start Date End Date Taking? Authorizing Provider  acetaminophen (TYLENOL) 325 MG tablet Take 650 mg by mouth every 6 (six) hours as needed for moderate pain.   Yes [provider]  apixaban (ELIQUIS) 5 MG TABS tablet Take 2 tablets (54m) twice daily for 7 days, then 1 tablet (524m twice daily Patient taking differently: Take 5 mg  by mouth 2 (two) times daily.  02/04/19  Yes Hayden Rasmussen, MD  atenolol (TENORMIN) 50 MG tablet Take 75 mg by mouth 2 (two) times daily.  01/08/17  Yes [provider]  cholecalciferol (VITAMIN D3) 25 MCG (1000 UT) tablet Take 1,000 Units by mouth daily.   Yes [provider]  furosemide (LASIX) 40 MG tablet Take 1 tablet (40 mg total) by mouth 2 (two) times daily. Patient taking differently: Take 40 mg by mouth daily.  03/31/18  Yes  [place] Eric J, DO  guaiFENesin (MUCINEX) 600 MG 12 hr tablet Take 600 mg by mouth daily.    Yes [provider]  metFORMIN (GLUCOPHAGE-XR) 500 MG 24 hr tablet Take 1,000 mg by mouth at bedtime. 01/25/19  Yes [provider]  mycophenolate (CELLCEPT) 500 MG tablet Take 1,500 mg by mouth 2 (two) times daily.    Yes [provider]  predniSONE (DELTASONE) 10 MG tablet Take 20 mg by mouth daily.  01/16/19  Yes [provider]  sulfamethoxazole-trimethoprim (BACTRIM DS) 800-160 MG tablet Take 1 tablet by mouth 3 (three) times a week. 12/24/18  Yes [provider]  TYVASO REFILL 0.6 MG/ML SOLN Inhale 0.6 mg into the lungs every 6 (six) hours. 12/13/18  Yes [provider]  verapamil (CALAN-SR) 120 MG CR tablet Take 1 tablet (120 mg total) by mouth at bedtime. 09/23/14  Yes Etta Quill, NP  albuterol (PROVENTIL HFA;VENTOLIN HFA) 108 (90 Base) MCG/ACT inhaler Inhale 1 puff into the lungs every 6 (six) hours as needed for wheezing or shortness of breath.    [provider]  HYDROcodone-homatropine (HYCODAN) 5-1.5 MG/5ML syrup Take 5 mLs by mouth every 4 (four) hours as needed for cough. Patient not taking: Reported on 02/04/2019 03/31/18   [place] Donnamarie Poag, DO  ipratropium-albuterol (DUONEB) 0.5-2.5 (3) MG/3ML SOLN Inhale 3 mLs into the lungs every 6 (six) hours as needed (sob and wheezing).  01/08/17   [provider]  predniSONE (DELTASONE) 10 MG tablet Take 6 tablets (60 mg total) by mouth daily for 3 days, THEN 4 tablets (40 mg total) daily for 4 days, THEN 3 tablets (30 mg total) daily for 4 days. 02/23/19 03/06/19  Antonietta Breach, PA-C    Allergies    Patient has no known allergies.  Review of Systems   Review of Systems  Respiratory: Positive for shortness of breath.   Ten systems reviewed and are negative for acute change, except as noted in the HPI.    Physical Exam Updated Vital Signs BP 120/87   Pulse 97   Temp 98.2 F  (36.8 C) (Oral)   Resp (!) 30   Ht _0  (1.651 m)   Wt 107.5 kg   SpO2 99%   BMI 39.44 kg/m   Physical Exam Vitals and nursing note reviewed.  Constitutional:      General: She is not in acute distress.    Appearance: She is well-developed. She is not diaphoretic.     Comments: Obese AA female.   HENT:     Head: Normocephalic and atraumatic.  Eyes:     General: No scleral icterus.    Conjunctiva/sclera: Conjunctivae normal.  Cardiovascular:     Rate and Rhythm: Normal rate and regular rhythm.     Pulses: Normal pulses.  Pulmonary:     Effort: Tachypnea and accessory muscle usage present.     Breath sounds: No stridor. Wheezing present.     Comments: Sats 86-92% on 15L via  [place]. Patient tachypneic with obvious dyspnea.  Some accessory muscle use.  No retractions.  Lungs with faint squeaking wheeze at the end of exhalation; otherwise clear. Musculoskeletal:        General: Normal range of motion.     Cervical back: Normal range of motion.     Right lower leg: Edema present.     Left lower leg: Edema present.     Comments: 1+ pitting edema LLE, 2+ in RLE. No erythema, heat to touch, lymphangitic streaking.   Skin:    General: Skin is warm and dry.     Coloration: Skin is not pale.     Findings: No erythema or rash.  Neurological:     Mental Status: She is alert and oriented to person, place, and time.     Coordination: Coordination normal.     Comments: Moving all extremities spontaneously.  Psychiatric:        Behavior: Behavior normal.     ED Results / Procedures / Treatments   Labs (all labs ordered are listed, but only abnormal results are displayed) Labs Reviewed  CBC WITH DIFFERENTIAL/PLATELET - Abnormal; Notable for the following components:      Result Value   MCH 24.6 (*)    MCHC 28.9 (*)    RDW 16.7 (*)    nRBC 0.3 (*)    Abs Immature Granulocytes 0.14 (*)    All other components within normal limits  BRAIN NATRIURETIC PEPTIDE - Abnormal; Notable for  the following components:   B Natriuretic Peptide 612.0 (*)    All other components within normal limits  COMPREHENSIVE METABOLIC PANEL - Abnormal; Notable for the following components:   Chloride 97 (*)    Glucose, Bld 160 (*)    AST 62 (*)    All other components within normal limits  RESPIRATORY PANEL BY RT PCR (FLU A&B, COVID)  BLOOD GAS, ARTERIAL  MAGNESIUM  POC SARS CORONAVIRUS 2 AG -  ED    EKG EKG Interpretation  Date/Time:  Saturday February 23 2019 00:01:06 EST Ventricular Rate:  83 PR Interval:    QRS Duration: 110 QT Interval:  460 QTC Calculation: 541 R Axis:   132 Text Interpretation: Sinus rhythm Right axis deviation Low voltage, precordial leads Abnormal R-wave progression, late transition Borderline T abnormalities, diffuse leads Prolonged QT interval Confirmed by Ripley Fraise [phone]) on 02/23/2019 12:19:45 AM   Radiology DG Chest 2 View  Result Date: 02/22/2019 CLINICAL DATA:  Increasing shortness of breath, history of CHF EXAM: CHEST - 2 VIEW COMPARISON:  02/04/2019 FINDINGS: Cardiac shadow is stable. The lungs are well aerated bilaterally. Persistent parenchymal opacities are again identified bilaterally with underlying interstitial changes. The overall appearance is similar to that seen on recent CT examination. No sizable effusion or pneumothorax is noted. No bony abnormality is seen. IMPRESSION: Stable diffuse parenchymal opacities when compared with prior CT examination. Patient has a history of nonspecific pneumonitis and these findings are consistent. Possibility of some superimposed edema given the history of CHF deserves consideration as well. Electronically Signed   By: Inez Catalina M.D.   On: 02/22/2019 23:39    Procedures Procedures (including critical care time)  Medications Ordered in ED Medications  apixaban (ELIQUIS) tablet 5 mg (has no administration in time range)  atenolol (TENORMIN) tablet 75 mg (has no administration in time range)    furosemide (LASIX) tablet 40 mg (has no administration in time range)  mycophenolate (CELLCEPT) capsule 1,500 mg (has no administration in time range)  verapamil (CALAN-SR) CR tablet 120 mg (has no administration in time range)  ipratropium-albuterol (DUONEB) 0.5-2.5 (3) MG/3ML nebulizer solution 3 mL (has no administration in time range)  predniSONE (DELTASONE) tablet 60 mg (has no administration in time range)  apixaban (ELIQUIS) tablet 5 mg (5 mg Oral Given 02/22/19 2256)  predniSONE (DELTASONE) tablet 60 mg (60 mg Oral Given 02/22/19 2333)  albuterol (PROVENTIL) (2.5 MG/3ML) 0.083% nebulizer solution 5 mg (5 mg Nebulization Given 02/23/19 0014)  ipratropium (ATROVENT) nebulizer solution 0.5 mg (0.5 mg Nebulization Given 02/23/19 0015)  furosemide (LASIX) injection 40 mg (40 mg Intravenous Given 02/23/19 0152)    ED Course  I have reviewed the triage vital signs and the nursing notes.  Pertinent labs & imaging results that were available during my care of the patient were reviewed by me and considered in my medical decision making (see chart for details).      MDM Rules/Calculators/A&P                       10:30 PM 49 y/o with hx of NSIP on 10-15L home supplemental O2 presenting for increased SOB. Appears dyspneic with tachypnea. Sats in the low 90's on chronic O2, though lung sounds fairly unremarkable. No fevers or hx of sick contacts. She was recently diagnosed with a DVT; has been compliant with her Eliquis. Negative CT PE study on 02/04/19.  Plan for labs, CXR, EKG. Will give oral prednisone, Duoneb. May require BiPAP if no symptomatic improvement with supportive measures.  12:12 AM Patient feeling better after the oral prednisone. Seems much more comfortable and less dyspneic than on arrival. Sats stable on chronic 15L via [place]. States she has been put on increased steroid bursts to taper with exacerbations of SOB in the past. Pending BNP. RN to give DuoNeb.  12:35 AM Lasix  ordered given elevated BNP, BLE pitting edema, with questionable superimposed edema on CXR.  6:17 AM Patient reassessed.  She continues to report that her symptoms remain improved.  Her oxygen saturations have not dropped below 96% on 15 L.  She feels comfortable with plan for discharge. States that she no longer has a "long tank" to get her home on oxygen; is hoping we can supply her one to use and return. Now reporting her daughter is not off work until 40 and will not be able to pick her up from the ED until then.  Will consult SW and CM regarding barriers to discharge. Home medications ordered. Tyvaso unfortunately not stocked in this hospital, per pharmacy.   6:30 AM Patient signed out to [place], PA-C at shift change pending discharge.   Final Clinical Impression(s) / ED Diagnoses Final diagnoses:  Shortness of breath    Rx / DC Orders ED Discharge Orders         Ordered    predniSONE (DELTASONE) 10 MG tablet     02/23/19 0626           Antonietta Breach, PA-C 02/23/19 0631    Ripley Fraise, MD 02/23/19 [phone]

## 2019-02-22 NOTE — ED Notes (Signed)
EKG given to EDP,Butler,MD., for review.

## 2019-02-22 NOTE — ED Notes (Signed)
Pt transported to xray 

## 2019-02-22 NOTE — ED Triage Notes (Signed)
BIB EMS from Home. Pt has hx of CHF, DVT normally on 15L at home. Pt became increased SHOB tonight and was unable to stand or ambulate like normal.  Pt reports congested. Pt also reports bilateral leg swelling. Pt currently on non-rebreather at 94%.

## 2019-02-23 ENCOUNTER — Inpatient Hospital Stay (HOSPITAL_COMMUNITY): Payer: 59

## 2019-02-23 DIAGNOSIS — I110 Hypertensive heart disease with heart failure: Secondary | ICD-10-CM | POA: Diagnosis present

## 2019-02-23 DIAGNOSIS — E669 Obesity, unspecified: Secondary | ICD-10-CM | POA: Diagnosis present

## 2019-02-23 DIAGNOSIS — J8489 Other specified interstitial pulmonary diseases: Secondary | ICD-10-CM

## 2019-02-23 DIAGNOSIS — I5032 Chronic diastolic (congestive) heart failure: Secondary | ICD-10-CM | POA: Diagnosis present

## 2019-02-23 DIAGNOSIS — J9621 Acute and chronic respiratory failure with hypoxia: Secondary | ICD-10-CM | POA: Diagnosis present

## 2019-02-23 DIAGNOSIS — Z79899 Other long term (current) drug therapy: Secondary | ICD-10-CM | POA: Diagnosis not present

## 2019-02-23 DIAGNOSIS — J9601 Acute respiratory failure with hypoxia: Secondary | ICD-10-CM | POA: Diagnosis present

## 2019-02-23 DIAGNOSIS — Z8249 Family history of ischemic heart disease and other diseases of the circulatory system: Secondary | ICD-10-CM | POA: Diagnosis not present

## 2019-02-23 DIAGNOSIS — I272 Pulmonary hypertension, unspecified: Secondary | ICD-10-CM | POA: Diagnosis not present

## 2019-02-23 DIAGNOSIS — Z7952 Long term (current) use of systemic steroids: Secondary | ICD-10-CM | POA: Diagnosis not present

## 2019-02-23 DIAGNOSIS — Z6839 Body mass index (BMI) 39.0-39.9, adult: Secondary | ICD-10-CM | POA: Diagnosis not present

## 2019-02-23 DIAGNOSIS — Z7984 Long term (current) use of oral hypoglycemic drugs: Secondary | ICD-10-CM | POA: Diagnosis not present

## 2019-02-23 DIAGNOSIS — D84821 Immunodeficiency due to drugs: Secondary | ICD-10-CM | POA: Diagnosis present

## 2019-02-23 DIAGNOSIS — R0602 Shortness of breath: Secondary | ICD-10-CM | POA: Diagnosis present

## 2019-02-23 DIAGNOSIS — Z9981 Dependence on supplemental oxygen: Secondary | ICD-10-CM | POA: Diagnosis not present

## 2019-02-23 DIAGNOSIS — E1165 Type 2 diabetes mellitus with hyperglycemia: Secondary | ICD-10-CM | POA: Diagnosis not present

## 2019-02-23 DIAGNOSIS — Z833 Family history of diabetes mellitus: Secondary | ICD-10-CM | POA: Diagnosis not present

## 2019-02-23 DIAGNOSIS — T380X5A Adverse effect of glucocorticoids and synthetic analogues, initial encounter: Secondary | ICD-10-CM | POA: Diagnosis present

## 2019-02-23 DIAGNOSIS — Z20822 Contact with and (suspected) exposure to covid-19: Secondary | ICD-10-CM | POA: Diagnosis present

## 2019-02-23 DIAGNOSIS — Z7901 Long term (current) use of anticoagulants: Secondary | ICD-10-CM | POA: Diagnosis not present

## 2019-02-23 LAB — RESPIRATORY PANEL BY RT PCR (FLU A&B, COVID)
Influenza A by PCR: NEGATIVE
Influenza B by PCR: NEGATIVE
SARS Coronavirus 2 by RT PCR: NEGATIVE

## 2019-02-23 LAB — BRAIN NATRIURETIC PEPTIDE: B Natriuretic Peptide: 612 pg/mL — ABNORMAL HIGH (ref 0.0–100.0)

## 2019-02-23 MED ORDER — FUROSEMIDE 40 MG PO TABS
40.0000 mg | ORAL_TABLET | Freq: Two times a day (BID) | ORAL | Status: DC
  Administered 2019-02-23 – 2019-03-03 (×17): 40 mg via ORAL
  Filled 2019-02-23 (×17): qty 1

## 2019-02-23 MED ORDER — ALBUTEROL SULFATE HFA 108 (90 BASE) MCG/ACT IN AERS
1.0000 | INHALATION_SPRAY | Freq: Four times a day (QID) | RESPIRATORY_TRACT | Status: DC | PRN
  Filled 2019-02-23: qty 6.7

## 2019-02-23 MED ORDER — VITAMIN D 25 MCG (1000 UNIT) PO TABS
1000.0000 [IU] | ORAL_TABLET | Freq: Every day | ORAL | Status: DC
  Administered 2019-02-24 – 2019-03-03 (×8): 1000 [IU] via ORAL
  Filled 2019-02-23 (×9): qty 1

## 2019-02-23 MED ORDER — MYCOPHENOLATE MOFETIL 250 MG PO CAPS
1500.0000 mg | ORAL_CAPSULE | Freq: Two times a day (BID) | ORAL | Status: DC
  Administered 2019-02-23 – 2019-03-03 (×17): 1500 mg via ORAL
  Filled 2019-02-23 (×19): qty 6

## 2019-02-23 MED ORDER — PREDNISONE 10 MG PO TABS: ORAL_TABLET | ORAL | 0 refills | Status: DC

## 2019-02-23 MED ORDER — METFORMIN HCL ER 500 MG PO TB24
1000.0000 mg | ORAL_TABLET | Freq: Every day | ORAL | Status: DC
  Administered 2019-02-24 – 2019-03-02 (×8): 1000 mg via ORAL
  Filled 2019-02-23 (×9): qty 2

## 2019-02-23 MED ORDER — ATENOLOL 25 MG PO TABS
75.0000 mg | ORAL_TABLET | Freq: Two times a day (BID) | ORAL | Status: DC
  Administered 2019-02-23 – 2019-03-03 (×15): 75 mg via ORAL
  Filled 2019-02-23 (×2): qty 3
  Filled 2019-02-23 (×2): qty 1
  Filled 2019-02-23 (×12): qty 3
  Filled 2019-02-23: qty 1
  Filled 2019-02-23: qty 3

## 2019-02-23 MED ORDER — METHYLPREDNISOLONE SODIUM SUCC 125 MG IJ SOLR
80.0000 mg | Freq: Two times a day (BID) | INTRAMUSCULAR | Status: DC
  Administered 2019-02-23 – 2019-02-25 (×6): 80 mg via INTRAVENOUS
  Filled 2019-02-23 (×6): qty 2

## 2019-02-23 MED ORDER — IPRATROPIUM-ALBUTEROL 0.5-2.5 (3) MG/3ML IN SOLN
3.0000 mL | Freq: Four times a day (QID) | RESPIRATORY_TRACT | Status: DC | PRN
  Administered 2019-02-24: 3 mL via RESPIRATORY_TRACT
  Filled 2019-02-23: qty 3

## 2019-02-23 MED ORDER — PREDNISONE 20 MG PO TABS
60.0000 mg | ORAL_TABLET | Freq: Once | ORAL | Status: AC
  Administered 2019-02-23: 60 mg via ORAL
  Filled 2019-02-23: qty 3

## 2019-02-23 MED ORDER — VERAPAMIL HCL ER 120 MG PO TBCR
120.0000 mg | EXTENDED_RELEASE_TABLET | Freq: Every day | ORAL | Status: DC
  Administered 2019-02-24 – 2019-03-02 (×8): 120 mg via ORAL
  Filled 2019-02-23 (×9): qty 1

## 2019-02-23 MED ORDER — FUROSEMIDE 10 MG/ML IJ SOLN
40.0000 mg | INTRAMUSCULAR | Status: AC
  Administered 2019-02-23: 02:00:00 40 mg via INTRAVENOUS
  Filled 2019-02-23: qty 4

## 2019-02-23 MED ORDER — SULFAMETHOXAZOLE-TRIMETHOPRIM 800-160 MG PO TABS
1.0000 | ORAL_TABLET | ORAL | Status: DC
  Administered 2019-02-25 – 2019-03-01 (×3): 1 via ORAL
  Filled 2019-02-23 (×3): qty 1

## 2019-02-23 MED ORDER — ACETAMINOPHEN 325 MG PO TABS
650.0000 mg | ORAL_TABLET | Freq: Four times a day (QID) | ORAL | Status: DC | PRN
  Administered 2019-02-24 – 2019-03-01 (×3): 650 mg via ORAL
  Filled 2019-02-23 (×3): qty 2

## 2019-02-23 MED ORDER — GUAIFENESIN ER 600 MG PO TB12
600.0000 mg | ORAL_TABLET | Freq: Every day | ORAL | Status: DC
  Administered 2019-02-23 – 2019-03-03 (×9): 600 mg via ORAL
  Filled 2019-02-23 (×9): qty 1

## 2019-02-23 MED ORDER — APIXABAN 5 MG PO TABS
5.0000 mg | ORAL_TABLET | Freq: Two times a day (BID) | ORAL | Status: DC
  Administered 2019-02-23 – 2019-03-03 (×17): 5 mg via ORAL
  Filled 2019-02-23 (×18): qty 1

## 2019-02-23 NOTE — Care Management (Addendum)
ED CM spoke with patient and EDP concerning patient needing assistance with transport home on 15L Oxygen.  Patient reports not having keys to her home to be transported by [facility] at this time.  She states her daughter will be able to bring portable tank to transport her home at 5p Offered to transport her home with [facility] with oxygen patient declined, prefers to transport in private vehicle with portable oxygen, EDP and Archivist were both updated.[name]

## 2019-02-23 NOTE — Discharge Instructions (Addendum)
Increase your Lasix to 40m twice a day for the next week. You have been placed on a prednisone taper. Take as prescribed until finished and then resume your current prednisone dose. Follow up with your primary care doctor and your pulmonologist. Return to the ED for new or concerning symptoms.

## 2019-02-23 NOTE — ED Notes (Signed)
Daughter called wanting to speak with nurse regarding information that "may be helpful" and wishes to be called back at [phone]

## 2019-02-23 NOTE — ED Notes (Addendum)
Called to daughter at [phone]. Gave update.

## 2019-02-23 NOTE — ED Provider Notes (Signed)
Received patient as a handoff from [employer], PA-C at shift change.  HPI Per handoff provider:  [name] is a 49 y.o. female.  49 y/o female with hx of NSIP (on Cellcept and chronic steroids), pulmonary HTN, dCHF, VTE (on chronic Eliquis), DM presents to the ED for c/o worsening SOB. Patient chronically on 10-15L supplemental O2 at baseline. Usually is able to walk short distances without issue. Notes increased DOE lately with lightheadness following prolonged exertion. Feels a tightness in her central chest with occasional "fluttering" at times when she has worsening dyspnea. Reports experiencing symptoms of a "sinus infection" x 7 months with cough productive of clear phlegm. Last course of abx was 2 weeks ago. Her doctor called her in an Rx for a Zpack a few days ago, but she has not yet started this. Notes compliance with her daily medications and blood thinners. Has noted some increased fluid retention in her BLE.  Asymmetric swelling in her RLE has improved since DVT diagnosis in December. Patient denies fever, syncope, chest pain, palpitations, vomiting, sick contacts.  Patient confirms status is FULL CODE  Physical Exam  BP (!) 127/98   Pulse 97   Temp 98.2 F (36.8 C) (Oral)   Resp (!) 31   Ht _0  (1.651 m)   Wt 107.5 kg   SpO2 100%   BMI 39.44 kg/m   Physical Exam Vitals and nursing note reviewed. Exam conducted with a chaperone present.  Constitutional:      Appearance: Normal appearance.  HENT:     Head: Normocephalic and atraumatic.  Eyes:     General: No scleral icterus.    Conjunctiva/sclera: Conjunctivae normal.  Cardiovascular:     Rate and Rhythm: Normal rate and regular rhythm.     Pulses: Normal pulses.     Heart sounds: Normal heart sounds.  Pulmonary:     Comments: Patient on 15 L via nonrebreather.  Resting comfortably in bed and able to have full conversation.  Mildly tachypneic, but otherwise no acute respiratory distress.  No accessory  muscle use.  Breath sounds intact bilaterally and normal. Musculoskeletal:     Right lower leg: No edema.     Left lower leg: No edema.  Skin:    General: Skin is dry.  Neurological:     Mental Status: She is alert and oriented to person, place, and time.     GCS: GCS eye subscore is 4. GCS verbal subscore is 5. GCS motor subscore is 6.  Psychiatric:        Mood and Affect: Mood normal.        Behavior: Behavior normal.        Thought Content: Thought content normal.      ED Course/Procedures     Procedures  Results for orders placed or performed during the hospital encounter of 02/22/19  Respiratory Panel by RT PCR (Flu A&B, Covid) - Nasopharyngeal Swab   Specimen: Nasopharyngeal Swab  Result Value Ref Range   SARS Coronavirus 2 by RT PCR NEGATIVE NEGATIVE   Influenza A by PCR NEGATIVE NEGATIVE   Influenza B by PCR NEGATIVE NEGATIVE  CBC with Differential  Result Value Ref Range   WBC 6.8 4.0 - 10.5 K/uL   RBC 4.88 3.87 - 5.11 MIL/uL   Hemoglobin 12.0 12.0 - 15.0 g/dL   HCT 41.5 36.0 - 46.0 %   MCV 85.0 80.0 - 100.0 fL   MCH 24.6 (L) 26.0 - 34.0 pg   MCHC 28.9 (L)  30.0 - 36.0 g/dL   RDW 16.7 (H) 11.5 - 15.5 %   Platelets 364 150 - 400 K/uL   nRBC 0.3 (H) 0.0 - 0.2 %   Neutrophils Relative % 67 %   Neutro Abs 4.6 1.7 - 7.7 K/uL   Lymphocytes Relative 15 %   Lymphs Abs 1.1 0.7 - 4.0 K/uL   Monocytes Relative 13 %   Monocytes Absolute 0.9 0.1 - 1.0 K/uL   Eosinophils Relative 3 %   Eosinophils Absolute 0.2 0.0 - 0.5 K/uL   Basophils Relative 0 %   Basophils Absolute 0.0 0.0 - 0.1 K/uL   Immature Granulocytes 2 %   Abs Immature Granulocytes 0.14 (H) 0.00 - 0.07 K/uL  Brain natriuretic peptide  Result Value Ref Range   B Natriuretic Peptide 612.0 (H) 0.0 - 100.0 pg/mL  Blood gas, arterial (at [facility])  Result Value Ref Range   FIO2 100%    Delivery systems NON-REBREATHER OXYGEN MASK    pH, Arterial 7.410 7.350 - 7.450   pCO2 arterial 41.2 32.0 - 48.0 mmHg    pO2, Arterial 92.0 83.0 - 108.0 mmHg   Bicarbonate 25.8 20.0 - 28.0 mmol/L   Acid-Base Excess 1.4 0.0 - 2.0 mmol/L   O2 Saturation 96.7 %   Patient temperature 97.7   Magnesium  Result Value Ref Range   Magnesium 1.9 1.7 - 2.4 mg/dL  CMP  Result Value Ref Range   Sodium 139 135 - 145 mmol/L   Potassium 3.8 3.5 - 5.1 mmol/L   Chloride 97 (L) 98 - 111 mmol/L   CO2 28 22 - 32 mmol/L   Glucose, Bld 160 (H) 70 - 99 mg/dL   BUN 16 6 - 20 mg/dL   Creatinine, Ser 0.96 0.44 - 1.00 mg/dL   Calcium 9.2 8.9 - 10.3 mg/dL   Total Protein 7.0 6.5 - 8.1 g/dL   Albumin 3.8 3.5 - 5.0 g/dL   AST 62 (H) 15 - 41 U/L   ALT 32 0 - 44 U/L   Alkaline Phosphatase 87 38 - 126 U/L   Total Bilirubin 0.5 0.3 - 1.2 mg/dL   GFR calc non Af Amer >60 >60 mL/min   GFR calc Af Amer >60 >60 mL/min   Anion gap 14 5 - 15  POC SARS Coronavirus 2 Ag-ED - Nasal Swab (BD Veritor Kit)  Result Value Ref Range   SARS Coronavirus 2 Ag NEGATIVE NEGATIVE   DG Chest 2 View  Result Date: 02/22/2019 CLINICAL DATA:  Increasing shortness of breath, history of CHF EXAM: CHEST - 2 VIEW COMPARISON:  02/04/2019 FINDINGS: Cardiac shadow is stable. The lungs are well aerated bilaterally. Persistent parenchymal opacities are again identified bilaterally with underlying interstitial changes. The overall appearance is similar to that seen on recent CT examination. No sizable effusion or pneumothorax is noted. No bony abnormality is seen. IMPRESSION: Stable diffuse parenchymal opacities when compared with prior CT examination. Patient has a history of nonspecific pneumonitis and these findings are consistent. Possibility of some superimposed edema given the history of CHF deserves consideration as well. Electronically Signed   By: Inez Catalina M.D.   On: 02/22/2019 23:39   CT Angio Chest PE W and/or Wo Contrast  Result Date: 02/04/2019 CLINICAL DATA:  History of lupus with chest pain and shortness of breath, right lower extremity swelling  greater than left, elevated D-dimer EXAM: CT ANGIOGRAPHY CHEST WITH CONTRAST TECHNIQUE: Multidetector CT imaging of the chest was performed using the standard protocol  during bolus administration of intravenous contrast. Multiplanar CT image reconstructions and MIPs were obtained to evaluate the vascular anatomy. CONTRAST:  13m OMNIPAQUE IOHEXOL 350 MG/ML SOLN COMPARISON:  Radiograph 02/04/2019, CT 03/27/2018 FINDINGS: Cardiovascular: No convincing pulmonary arterial filling defects at the central and lobar level. Severe respiratory motion artifact limits more distal evaluation of the segmental and subsegmental arteries. The pulmonary trunk is borderline enlarged though similar to comparison from 03/27/2018. There is stable mild cardiomegaly with predominantly right heart enlargement. No pericardial effusion. Suboptimal opacification of the aorta. Normal caliber aorta with 3 vessel branching of the arch. Mediastinum/Nodes: Multiple low-attenuation mediastinal and hilar nodes with largely preserved nodal architecture largest is a 10 mm node in the AP window (5/77). Additional mediastinal and hilar nodes are similar to decreased in size from comparison studies. No mediastinal fluid or gas. No acute abnormality of the trachea or esophagus. Small hiatal hernia is noted. Normal thyroid gland and thoracic inlet. Lungs/Pleura: There are large geometric regions of mixed interstitial and alveolar opacity with underlying fibrosis and cystic bronchiectatic changes throughout the lungs. Suspect some underlying emphysematous changes well. Difficult to fully assess the lung parenchyma given the extensive respiratory motion artifact. No pneumothorax or effusion. Overall these features are slightly progressive from comparison exam. The presence of such advanced architectural distortion may limit detection of underlying parenchymal processes. Upper Abdomen: Reflux of contrast into the hepatic veins. No acute abnormalities present  in the visualized portions of the upper abdomen. Musculoskeletal: Multilevel degenerative changes are present in the imaged portions of the spine. No acute osseous abnormality or suspicious osseous lesion. Review of the MIP images confirms the above findings. IMPRESSION: 1. Severe respiratory motion artifact limits more distal evaluation of the segmental and subsegmental arteries. No convincing evidence for pulmonary embolus at the central and lobar level. 2. Interval progression of the mixed interstitial, ground-glass and consolidative opacity with associated fibrosis and bronchiectatic changes throughout the lungs, compatible with reported diagnosis of NSIP in the setting of systemic lupus erythematosus. 3. Given the extent of the parenchymal changes in the lungs and extensive respiratory motion artifact evaluation for underlying consolidative processes is limited. 4. Multiple borderline enlarged mediastinal and hilar nodes are likely reactive to the chronic inflammation and are similar diminished in size from comparison study. 5. Cardiomegaly with predominantly right heart enlargement. Reflux of contrast into the hepatic veins and central pulmonary enlargement may reflect some right-sided dysfunction, possibly secondary to the underlying parenchymal lung disease. 6. Small hiatal hernia. 7. Aortic Atherosclerosis (ICD10-I70.0) 8. Emphysema (ICD10-J43.9). Electronically Signed   By: PLovena LeM.D.   On: 02/04/2019 03:24   DG Chest Portable 1 View  Result Date: 02/04/2019 CLINICAL DATA:  Shortness of breath EXAM: PORTABLE CHEST 1 VIEW COMPARISON:  03/27/2018 FINDINGS: Mild cardiomegaly with moderate interstitial opacity, unchanged. No pneumothorax or sizable pleural effusion. IMPRESSION: Chronic interstitial lung disease, unchanged Electronically Signed   By: KUlyses JarredM.D.   On: 02/04/2019 01:03   VAS [place]OWER EXTREMITY VENOUS (DVT) ([facility] [place] 7a-7p)  Result Date: 02/05/2019  Lower Venous  Study Indications: Swelling, and SOB. Other Indications: Possible COVID-19. Limitations: Body habitus and poor ultrasound/tissue interface. Comparison Study: No prior study. Performing Technologist: MMaudry MayhewMHA, RDMS, RVT, RDCS  Examination Guidelines: A complete evaluation includes B-mode imaging, spectral Doppler, color Doppler, and power Doppler as needed of all accessible portions of each vessel. Bilateral testing is considered an integral part of a complete examination. Limited examinations for reoccurring indications may be performed as noted.  +---------+---------------+---------+-----------+----------+--------------+  RIGHT    CompressibilityPhasicitySpontaneityPropertiesThrombus Aging +---------+---------------+---------+-----------+----------+--------------+ CFV      Full           Yes      Yes                                 +---------+---------------+---------+-----------+----------+--------------+ SFJ      Full                                                        +---------+---------------+---------+-----------+----------+--------------+ FV Prox  Full                                                        +---------+---------------+---------+-----------+----------+--------------+ FV Mid   Full                                                        +---------+---------------+---------+-----------+----------+--------------+ FV DistalFull                                                        +---------+---------------+---------+-----------+----------+--------------+ PFV      Full                                                        +---------+---------------+---------+-----------+----------+--------------+ POP      Full           Yes      Yes                                 +---------+---------------+---------+-----------+----------+--------------+ PERO                             Yes                                  +---------+---------------+---------+-----------+----------+--------------+ Unable to adequately evaluate calf veins in transverse plane due to technical limitations. A single posterior tibial vein is noted to be patent by color Doppler, whereas the second (anterior) posterior tibial vein does not have any flow through it by color and pulsed wave Doppler. Echoes are visualized within this posterior tibial vein, therefore there is likely acute occlusive thrombus in this posterior tibial vein. Right peroneal veins are patent by color Doppler.  +---------+---------------+---------+-----------+----------+--------------+ LEFT     CompressibilityPhasicitySpontaneityPropertiesThrombus Aging +---------+---------------+---------+-----------+----------+--------------+ CFV      Full           Yes      Yes                                 +---------+---------------+---------+-----------+----------+--------------+  FV Prox  Full                                                        +---------+---------------+---------+-----------+----------+--------------+ FV Mid                           Yes                                 +---------+---------------+---------+-----------+----------+--------------+ FV Distal                        Yes                                 +---------+---------------+---------+-----------+----------+--------------+ POP      Full           Yes      Yes                                 +---------+---------------+---------+-----------+----------+--------------+ PTV                              Yes                                 +---------+---------------+---------+-----------+----------+--------------+ PERO                             Yes                                 +---------+---------------+---------+-----------+----------+--------------+ Unable to visualize left SFJ. Left mid and distal femoral veins are only visualized in the sagittal plane and  are patent by color and pulsed wave Doppler. Limited visualization of the left calf veins, however the posterior tibial and peroneal veins exhibit flow by color Doppler.  Summary: Right: Findings consistent with probable acute deep vein thrombosis involving a single right posterior tibial vein. No cystic structure found in the popliteal fossa. Left: There is no evidence of deep vein thrombosis in the lower extremity. However, portions of this examination were limited- see technologist comments above. No cystic structure found in the popliteal fossa.  *See table(s) above for measurements and observations. Electronically signed by Ruta Hinds MD on 02/05/2019 at 1:48:41 PM.    Final      MDM    Handoff provider consulted SW & CM regarding patient's barriers to discharge.  I spoke with Felicia [name] who informed me that patient will simply need to reach out to her oxygen DME provider regarding request for "long tube" portable oxygen tank.  Patient plans to do so after discharge.  [facility] is unable to bring her home at this time because her house was locked by her daughter with her keys inside and her daughter is unable to leave her place of employment until 5 PM.   Patient effectively is boarding with [place] until time of discharge.  Patient also states that she would refuse to travel home [facility] as her daughter can simply transport her with her portable oxygen tanks.  Her home medications have already been reordered and she has been placed on a cardiac diet in the interim.   On my examination, patient is resting comfortably on 15 L via nonrebreather, oxygenating at 100%.  She reports that she is feeling significantly improved after receiving oral prednisone and Lasix.  Will involve respiratory therapy and have transitioned to 15 L via nasal cannula as that is what she uses at home.  She usually also uses a compressor which helps increase oxygenation.    When attempting to place patient on high flow oxygen via nasal  cannula, patient desaturated to low 80s.  Patient does not have a nonrebreather at home and it is not feasible to discharge her given our inability to wean patient onto her normal oxygen via nasal cannula.  Patient reports that she is taking her Eliquis, as prescribed.  Low suspicion for PE at this time.  Patient is negative for COVID-19.   Dr. Melvyn Novas personally evaluated patient and will admit her for her new onset oxygen requirements and severe chronic respiratory failure, likely secondary to NSIP.      Corena Herter, PA-C 02/23/19 1209    Dorie Rank, MD 02/24/19 [id number]

## 2019-02-23 NOTE — ED Notes (Signed)
Patient transported to CT 

## 2019-02-23 NOTE — ED Notes (Signed)
Respiratory Therapist has been called to transition patient from non-rebreather to High Flow Nasal Canula.

## 2019-02-23 NOTE — H&P (Signed)
PULMONARY / CRITICAL CARE MEDICINE   NAME:  Felicia [name], MRN:  [id number], DOB:  Jul 26, 1970, LOS: 0 ADMISSION DATE:  02/22/2019, CONSULTATION DATE:  1/16 REFERRING MD:  EDP, CHIEF COMPLAINT:  Light headed, heart pounding   BRIEF HISTORY:    34 yobf with NSIP under care of [facility] with secondary [place] on tyvaso worse since March 2020 p tapered to pred 20 mg daily and course complicated by R DVT  72/62 and refractory "sinus infections" acutely worse Light headed/ sob p working at desk all day 1/15 to ER where did much better on NRM but desats again on NP so PCCM service asked to eval.   Denied cp/ stated R leg swelling resolved p rx with Eliquis and generalize swelling well controlled with lasix.   HISTORY OF PRESENT ILLNESS   49 y/o female with hx of NSIP (on Cellcept and chronic steroids), pulmonary HTN, dCHF, VTE (on chronic Eliquis), DM presented to the ED for c/o worsening SOB. Patient chronically on 10-15L supplemental O2 at baseline. Usually is able to walk short distances without issue. Notes increased DOE lately with lightheadness following prolonged exertion. Feels a tightness in her central chest with occasional "fluttering" at times when she has worsening dyspnea. Reports experiencing symptoms of a "sinus infection" x 7 months with cough productive of clear phlegm. Last course of abx was 2 weeks pta but no better.   Notes compliance with her daily medications and blood thinners.   No obvious day to day or daytime variability or assoc excess/ purulent sputum or mucus plugs or hemoptysis or cp   subjective wheeze or overt sinus or hb symptoms.   Sleeping flat without nocturnal  or early am exacerbation  of respiratory  c/o's or need for noct saba. Also denies any obvious fluctuation of symptoms with weather or environmental changes or other aggravating or alleviating factors except as outlined above.   No unusual exposure hx or h/o childhood pna/ asthma or knowledge of premature  birth.  Current Allergies, Complete Past Medical History, Past Surgical History, Family History, and Social History were reviewed in [employer] record.  ROS  The following are not active complaints unless bolded Hoarseness, sore throat, dysphagia, dental problems, itching, sneezing,  nasal congestion or discharge of excess mucus or purulent secretions, ear ache,   fever, chills, sweats, unintended wt loss or wt gain, classically pleuritic or exertional cp,  orthopnea pnd or arm/hand swelling  or leg swelling better,  presyncope, palpitations, abdominal pain, anorexia, nausea, vomiting, diarrhea  or change in bowel habits or change in bladder habits, change in stools or change in urine, dysuria, hematuria,  rash, arthralgias, visual complaints, headache, numbness, weakness or ataxia or problems with walking or coordination,  change in mood or  memory.          SIGNIFICANT PAST MEDICAL HISTORY    . ILD (interstitial lung disease) ([place]) 03/27/2018  . Palpitations 03/20/2018  . Chronic diastolic HF (heart failure) ([place]) 03/21/2017  . Pulmonary HTN ([place]) 03/21/2017  . Medication management 03/21/2017  . NSIP (nonspecific interstitial pneumonitis) ([place]) 02/03/2017  . Essential hypertension 02/03/2017  . Dyspnea on exertion   . On home oxygen therapy   . PSVT (paroxysmal supraventricular tachycardia) ([place]) 09/23/2014  . Interstitial pneumonitis ([place]) 09/23/2014  . UNSPEC ALVEOLAR&PARIETOALVEOLAR PNEUMONOPATHY 09/12/2008  . HYPERTENSION 09/11/2008  . BRONCHITIS 09/11/2008     SIGNIFICANT EVENTS:   STUDIES:     CULTURES:     ANTIBIOTICS:  LINES/TUBES:    CONSULTANTS:    SUBJECTIVE:  Comfortable at rest at 30 degrees on 100% NRM but immediate desats/ high wob on 15lpm NP   CONSTITUTIONAL: BP (!) 131/91   Pulse 98   Temp 98.2 F (36.8 C) (Oral)   Resp (!) 30   Ht _0  (1.651 m)   Wt 107.5 kg   SpO2 100%   BMI 39.44 kg/m   No intake/output  data recorded.        PHYSICAL EXAM: General:  cushingnoid bf on 100% NRM Neuro:  Intact sensorium, good grasp of details of care HEENT:  orphx clear  Cardiovascular:  RRR  - increased P2 Lungs:  Clear to A and P  Abdomen:  Soft, benign Musculoskeletal:  No clubbing, calf tenderness or edema Skin:  No rash/ breakdown   RESOLVED PROBLEM LIST   ASSESSMENT AND PLAN   1) Severe chronic resp failure/ hypoxemia secondary NSIP ? Partially steroid responsive >>> check esr/ solumedrol 80 mg IV  >>> check sinus Ct as may well be we can't use nose to deliver 02 going forward/ ent eval this admit if needed   2) PAH tyvaso and 02 dep so critical she say over 90% sats  2)  DVT R LE 02/03/2019 on eliquis, very unlikely throwing PE's now and no change rx needed   SUMMARY OF TODAY'S PLAN:  Admit for NRM, titrate to sats > 90%    **  LABS  Glucose No results for input(s): GLUCAP in the last 168 hours.  BMET Recent Labs  Lab 02/22/19 2321  NA 139  K 3.8  CL 97*  CO2 28  BUN 16  CREATININE 0.96  GLUCOSE 160*    Liver Enzymes Recent Labs  Lab 02/22/19 2321  AST 62*  ALT 32  ALKPHOS 87  BILITOT 0.5  ALBUMIN 3.8    Electrolytes Recent Labs  Lab 02/22/19 2321  CALCIUM 9.2  MG 1.9    CBC Recent Labs  Lab 02/22/19 2321  WBC 6.8  HGB 12.0  HCT 41.5  PLT 364    ABG Recent Labs  Lab 02/22/19 2252  PHART 7.410  PCO2ART 41.2  PO2ART 92.0    Coag's No results for input(s): APTT, INR in the last 168 hours.  Sepsis Markers No results for input(s): LATICACIDVEN, PROCALCITON, O2SATVEN in the last 168 hours.  Cardiac Enzymes No results for input(s): TROPONINI, PROBNP in the last 168 hours.  PAST MEDICAL HISTORY :   She  has a past medical history of Diabetes mellitus without complication ([place]), Hypertension, NSIP (nonspecific interstitial pneumonitis) ([place]), and SVT (supraventricular tachycardia) ([place]).  PAST SURGICAL HISTORY:  She  has a past surgical  history that includes Cesarean section and Lung biopsy.  No Known Allergies  No current facility-administered medications on file prior to encounter.   Current Outpatient Medications on File Prior to Encounter  Medication Sig  . acetaminophen (TYLENOL) 325 MG tablet Take 650 mg by mouth every 6 (six) hours as needed for moderate pain.  [name] apixaban (ELIQUIS) 5 MG TABS tablet Take 2 tablets (53m) twice daily for 7 days, then 1 tablet (540m twice daily (Patient taking differently: Take 5 mg by mouth 2 (two) times daily. )  . atenolol (TENORMIN) 50 MG tablet Take 75 mg by mouth 2 (two) times daily.   . cholecalciferol (VITAMIN D3) 25 MCG (1000 UT) tablet Take 1,000 Units by mouth daily.  . furosemide (LASIX) 40 MG tablet Take 1 tablet (40 mg total)  by mouth 2 (two) times daily. (Patient taking differently: Take 40 mg by mouth daily. )  . guaiFENesin (MUCINEX) 600 MG 12 hr tablet Take 600 mg by mouth daily.   . metFORMIN (GLUCOPHAGE-XR) 500 MG 24 hr tablet Take 1,000 mg by mouth at bedtime.  . mycophenolate (CELLCEPT) 500 MG tablet Take 1,500 mg by mouth 2 (two) times daily.   . predniSONE (DELTASONE) 10 MG tablet Take 20 mg by mouth daily.   [name] sulfamethoxazole-trimethoprim (BACTRIM DS) 800-160 MG tablet Take 1 tablet by mouth 3 (three) times a week.  [name] TYVASO REFILL 0.6 MG/ML SOLN Inhale 0.6 mg into the lungs every 6 (six) hours.  . verapamil (CALAN-SR) 120 MG CR tablet Take 1 tablet (120 mg total) by mouth at bedtime.  [name] albuterol (PROVENTIL HFA;VENTOLIN HFA) 108 (90 Base) MCG/ACT inhaler Inhale 1 puff into the lungs every 6 (six) hours as needed for wheezing or shortness of breath.  [name] HYDROcodone-homatropine (HYCODAN) 5-1.5 MG/5ML syrup Take 5 mLs by mouth every 4 (four) hours as needed for cough. (Patient not taking: Reported on 02/04/2019)  . ipratropium-albuterol (DUONEB) 0.5-2.5 (3) MG/3ML SOLN Inhale 3 mLs into the lungs every 6 (six) hours as needed (sob and wheezing).     FAMILY  HISTORY:   Her family history includes Cancer in her father, maternal grandfather, maternal grandmother, paternal grandfather, and paternal grandmother; Diabetes in her father and sister; Hypertension in her father and mother; Mental retardation in her sister.  SOCIAL HISTORY:  She  reports that she has never smoked. She has never used smokeless tobacco. She reports that she does not drink alcohol or use drugs.      Christinia Gully, MD Pulmonary and [place] [phone] After 5:30 PM or weekends, use [facility] [phone]

## 2019-02-24 ENCOUNTER — Other Ambulatory Visit: Payer: Self-pay

## 2019-02-24 LAB — GLUCOSE, CAPILLARY
Glucose-Capillary: 345 mg/dL — ABNORMAL HIGH (ref 70–99)
Glucose-Capillary: 365 mg/dL — ABNORMAL HIGH (ref 70–99)
Glucose-Capillary: 243 mg/dL — ABNORMAL HIGH (ref 70–99)
Glucose-Capillary: 256 mg/dL — ABNORMAL HIGH (ref 70–99)

## 2019-02-24 LAB — MRSA PCR SCREENING: MRSA by PCR: NEGATIVE

## 2019-02-24 LAB — HEMOGLOBIN A1C
Hgb A1c MFr Bld: 8.6 % — ABNORMAL HIGH (ref 4.8–5.6)
Mean Plasma Glucose: 200.12 mg/dL

## 2019-02-24 LAB — CBG MONITORING, ED: Glucose-Capillary: 325 mg/dL — ABNORMAL HIGH (ref 70–99)

## 2019-02-24 MED ORDER — INSULIN ASPART 100 UNIT/ML ~~LOC~~ SOLN
0.0000 [IU] | Freq: Every day | SUBCUTANEOUS | Status: DC
  Administered 2019-02-24: 3 [IU] via SUBCUTANEOUS
  Administered 2019-02-25: 4 [IU] via SUBCUTANEOUS
  Administered 2019-02-26: 23:00:00 3 [IU] via SUBCUTANEOUS
  Administered 2019-02-27: 2 [IU] via SUBCUTANEOUS
  Administered 2019-02-28: 3 [IU] via SUBCUTANEOUS
  Administered 2019-03-01 – 2019-03-02 (×2): 4 [IU] via SUBCUTANEOUS

## 2019-02-24 MED ORDER — INSULIN ASPART 100 UNIT/ML ~~LOC~~ SOLN
0.0000 [IU] | Freq: Three times a day (TID) | SUBCUTANEOUS | Status: DC
  Administered 2019-02-24: 5 [IU] via SUBCUTANEOUS
  Administered 2019-02-24 (×2): 11 [IU] via SUBCUTANEOUS
  Administered 2019-02-25: 8 [IU] via SUBCUTANEOUS
  Administered 2019-02-25: 12:00:00 5 [IU] via SUBCUTANEOUS
  Administered 2019-02-25: 8 [IU] via SUBCUTANEOUS
  Administered 2019-02-26: 5 [IU] via SUBCUTANEOUS
  Administered 2019-02-26: 15 [IU] via SUBCUTANEOUS
  Administered 2019-02-26 – 2019-02-27 (×3): 11 [IU] via SUBCUTANEOUS
  Administered 2019-02-27: 5 [IU] via SUBCUTANEOUS
  Administered 2019-02-28: 18:00:00 3 [IU] via SUBCUTANEOUS
  Administered 2019-02-28: 09:00:00 2 [IU] via SUBCUTANEOUS
  Administered 2019-02-28 – 2019-03-01 (×2): 8 [IU] via SUBCUTANEOUS
  Administered 2019-03-01: 5 [IU] via SUBCUTANEOUS
  Administered 2019-03-02: 8 [IU] via SUBCUTANEOUS
  Administered 2019-03-02: 14:00:00 3 [IU] via SUBCUTANEOUS
  Administered 2019-03-03: 2 [IU] via SUBCUTANEOUS

## 2019-02-24 MED ORDER — CHLORHEXIDINE GLUCONATE CLOTH 2 % EX PADS
6.0000 | MEDICATED_PAD | Freq: Every day | CUTANEOUS | Status: DC
  Administered 2019-02-24 – 2019-03-02 (×7): 6 via TOPICAL

## 2019-02-24 NOTE — ED Notes (Signed)
Placed patient on non rebreather.

## 2019-02-24 NOTE — ED Notes (Addendum)
ED TO INPATIENT HANDOFF REPORT  ED Nurse Name and Phone #: Fredonia Highland [phone]  S Name/Age/Gender Felicia [name] 49 y.o. female Room/Bed: WA16/WA16  Code Status   Code Status: Full Code  Home/SNF/Other Home Patient oriented to: self, place, time and situation Is this baseline? Yes   Triage Complete: Triage complete  Chief Complaint Acute respiratory failure with hypoxemia ([place]) [J96.01]  Triage Note BIB EMS from Home. Pt has hx of CHF, DVT normally on 15L at home. Pt became increased SHOB tonight and was unable to stand or ambulate like normal.  Pt reports congested. Pt also reports bilateral leg swelling. Pt currently on non-rebreather at 94%.     Allergies No Known Allergies  Level of Care/Admitting Diagnosis ED Disposition    ED Disposition Condition Comment   Admit  Hospital Area: [facility] [[id number]]  Level of Care: Stepdown [14]  Admit to SDU based on following criteria: Respiratory Distress:  Frequent assessment and/or intervention to maintain adequate ventilation/respiration, pulmonary toilet, and respiratory treatment.  Admit to SDU based on following criteria: Cardiac Instability:  Patients experiencing chest pain, unconfirmed MI and stable, arrhythmias and CHF requiring medical management and potentially compromising patient's stability  Covid Evaluation: Confirmed COVID Negative  Diagnosis: Acute respiratory failure with hypoxemia [name]) [[id number]]  Admitting Physician: Wilmon Pali  Attending Physician: Tanda Rockers [[id number]]  Estimated length of stay: 3 - 4 days  Certification:: I certify this patient will need inpatient services for at least 2 midnights       B Medical/Surgery History Past Medical History:  Diagnosis Date  . Diabetes mellitus without complication ([place])    type 2   . Hypertension   . NSIP (nonspecific interstitial pneumonitis) ([place])   . SVT (supraventricular tachycardia) ([facility])    Past Surgical  History:  Procedure Laterality Date  . CESAREAN SECTION    . LUNG BIOPSY       A IV Location/Drains/Wounds Patient Lines/Drains/Airways Status   Active Line/Drains/Airways    Name:   Placement date:   Placement time:   Site:   Days:   Peripheral IV 02/22/19 Left Antecubital   02/22/19    2315    Antecubital   2          Intake/Output Last 24 hours No intake or output data in the 24 hours ending 02/24/19 0539  Labs/Imaging Results for orders placed or performed during the hospital encounter of 02/22/19 (from the past 48 hour(s))  Blood gas, arterial (at [name])     Status: None   Collection Time: 02/22/19 10:52 PM  Result Value Ref Range   FIO2 100%    Delivery systems NON-REBREATHER OXYGEN MASK    pH, Arterial 7.410 7.350 - 7.450   pCO2 arterial 41.2 32.0 - 48.0 mmHg   pO2, Arterial 92.0 83.0 - 108.0 mmHg   Bicarbonate 25.8 20.0 - 28.0 mmol/L   Acid-Base Excess 1.4 0.0 - 2.0 mmol/L   O2 Saturation 96.7 %   Patient temperature 97.7     Comment: Performed at [facility], [place] [address] [place], [place] [address]  CBC with Differential     Status: Abnormal   Collection Time: 02/22/19 11:21 PM  Result Value Ref Range   WBC 6.8 4.0 - 10.5 K/uL   RBC 4.88 3.87 - 5.11 MIL/uL   Hemoglobin 12.0 12.0 - 15.0 g/dL   HCT 41.5 36.0 - 46.0 %   MCV 85.0 80.0 - 100.0 fL  MCH 24.6 (L) 26.0 - 34.0 pg   MCHC 28.9 (L) 30.0 - 36.0 g/dL   RDW 16.7 (H) 11.5 - 15.5 %   Platelets 364 150 - 400 K/uL   nRBC 0.3 (H) 0.0 - 0.2 %   Neutrophils Relative % 67 %   Neutro Abs 4.6 1.7 - 7.7 K/uL   Lymphocytes Relative 15 %   Lymphs Abs 1.1 0.7 - 4.0 K/uL   Monocytes Relative 13 %   Monocytes Absolute 0.9 0.1 - 1.0 K/uL   Eosinophils Relative 3 %   Eosinophils Absolute 0.2 0.0 - 0.5 K/uL   Basophils Relative 0 %   Basophils Absolute 0.0 0.0 - 0.1 K/uL   Immature Granulocytes 2 %   Abs Immature Granulocytes 0.14 (H) 0.00 - 0.07 K/uL    Comment: Performed at [name], [place] [address], [place], [place] [address]  Brain natriuretic peptide     Status: Abnormal   Collection Time: 02/22/19 11:21 PM  Result Value Ref Range   B Natriuretic Peptide 612.0 (H) 0.0 - 100.0 pg/mL    Comment: Performed at [facility], [place] [address] [place], [place] [address]  Magnesium     Status: None   Collection Time: 02/22/19 11:21 PM  Result Value Ref Range   Magnesium 1.9 1.7 - 2.4 mg/dL    Comment: Performed at [facility], [place] [address] [place], [place] [address]  CMP     Status: Abnormal   Collection Time: 02/22/19 11:21 PM  Result Value Ref Range   Sodium 139 135 - 145 mmol/L   Potassium 3.8 3.5 - 5.1 mmol/L   Chloride 97 (L) 98 - 111 mmol/L   CO2 28 22 - 32 mmol/L   Glucose, Bld 160 (H) 70 - 99 mg/dL   BUN 16 6 - 20 mg/dL   Creatinine, Ser 0.96 0.44 - 1.00 mg/dL   Calcium 9.2 8.9 - 10.3 mg/dL   Total Protein 7.0 6.5 - 8.1 g/dL   Albumin 3.8 3.5 - 5.0 g/dL   AST 62 (H) 15 - 41 U/L   ALT 32 0 - 44 U/L   Alkaline Phosphatase 87 38 - 126 U/L   Total Bilirubin 0.5 0.3 - 1.2 mg/dL   GFR calc non Af Amer >60 >60 mL/min   GFR calc Af Amer >60 >60 mL/min   Anion gap 14 5 - 15    Comment: Performed at [facility], [place] [address], [place],  [address]  Respiratory Panel by RT PCR (Flu A&B, Covid) - Nasopharyngeal Swab     Status: None   Collection Time: 02/22/19 11:21 PM   Specimen: Nasopharyngeal Swab  Result Value Ref Range   SARS Coronavirus 2 by RT PCR NEGATIVE NEGATIVE    Comment: (NOTE) SARS-CoV-2 target nucleic acids are NOT DETECTED. The SARS-CoV-2 RNA is generally detectable in upper respiratoy specimens during the acute phase of infection. The lowest concentration of SARS-CoV-2 viral copies this assay can detect is 131 copies/mL. A negative result does not preclude SARS-Cov-2 infection and should not be used as the sole basis for treatment or other patient management  decisions. A negative result may occur with  improper specimen collection/handling, submission of specimen other than nasopharyngeal swab, presence of viral mutation(s) within the areas targeted by this assay, and inadequate number of viral copies (<131 copies/mL). A negative result must be combined with clinical observations, patient history, and epidemiological information. The expected result is Negative. Fact  Sheet for Patients:  PinkCheek.be Fact Sheet for Healthcare Providers:  GravelBags.it This test is not yet ap proved or cleared by the [place] FDA and  has been authorized for detection and/or diagnosis of SARS-CoV-2 by FDA under an Emergency Use Authorization (EUA). This EUA will remain  in effect (meaning this test can be used) for the duration of the COVID-19 declaration under Section 564(b)(1) of the Act, 21 U.S.C. section 360bbb-3(b)(1), unless the authorization is terminated or revoked sooner.    Influenza A by PCR NEGATIVE NEGATIVE   Influenza B by PCR NEGATIVE NEGATIVE    Comment: (NOTE) The Xpert Xpress SARS-CoV-2/FLU/RSV assay is intended as an aid in  the diagnosis of influenza from Nasopharyngeal swab specimens and  should not be used as a sole basis for treatment. Nasal washings and  aspirates are unacceptable for Xpert Xpress SARS-CoV-2/FLU/RSV  testing. Fact Sheet for Patients: PinkCheek.be Fact Sheet for Healthcare Providers: GravelBags.it This test is not yet approved or cleared by the [place] FDA and  has been authorized for detection and/or diagnosis of SARS-CoV-2 by  FDA under an Emergency Use Authorization (EUA). This EUA will remain  in effect (meaning this test can be used) for the duration of the  Covid-19 declaration under Section 564(b)(1) of the Act, 21  U.S.C. section 360bbb-3(b)(1), unless the authorization is   terminated or revoked. Performed at [facility], [place] [address] [place], [place] [address]   POC SARS Coronavirus 2 Ag-ED - Nasal Swab (BD Veritor Kit)     Status: None   Collection Time: 02/22/19 11:56 PM  Result Value Ref Range   SARS Coronavirus 2 Ag NEGATIVE NEGATIVE    Comment: (NOTE) SARS-CoV-2 antigen NOT DETECTED.  Negative results are presumptive.  Negative results do not preclude SARS-CoV-2 infection and should not be used as the sole basis for treatment or other patient management decisions, including infection  control decisions, particularly in the presence of clinical signs and  symptoms consistent with COVID-19, or in those who have been in contact with the virus.  Negative results must be combined with clinical observations, patient history, and epidemiological information. The expected result is Negative. Fact Sheet for Patients: PodPark.tn Fact Sheet for Healthcare Providers: GiftContent.is This test is not yet approved or cleared by the [place] FDA and  has been authorized for detection and/or diagnosis of SARS-CoV-2 by FDA under an Emergency Use Authorization (EUA).  This EUA will remain in effect (meaning this test can be used) for the duration of  the COVID-19 de claration under Section 564(b)(1) of the Act, 21 U.S.C. section 360bbb-3(b)(1), unless the authorization is terminated or revoked sooner.   POC CBG, ED     Status: Abnormal   Collection Time: 02/24/19 12:42 AM  Result Value Ref Range   Glucose-Capillary 325 (H) 70 - 99 mg/dL   DG Chest 2 View  Result Date: 02/22/2019 CLINICAL DATA:  Increasing shortness of breath, history of CHF EXAM: CHEST - 2 VIEW COMPARISON:  02/04/2019 FINDINGS: Cardiac shadow is stable. The lungs are well aerated bilaterally. Persistent parenchymal opacities are again identified bilaterally with underlying interstitial changes. The overall  appearance is similar to that seen on recent CT examination. No sizable effusion or pneumothorax is noted. No bony abnormality is seen. IMPRESSION: Stable diffuse parenchymal opacities when compared with prior CT examination. Patient has a history of nonspecific pneumonitis and these findings are consistent. Possibility of some superimposed edema given the history of CHF deserves consideration as well.  Electronically Signed   By: Inez Catalina M.D.   On: 02/22/2019 23:39   CT MAXILLOFACIAL WO CONTRAST  Result Date: 02/23/2019 CLINICAL DATA:  History of lupus with NSIP. Refractory sinus infection. EXAM: CT MAXILLOFACIAL WITHOUT CONTRAST TECHNIQUE: Multidetector CT imaging of the maxillofacial structures was performed. Multiplanar CT image reconstructions were also generated. COMPARISON:  None. FINDINGS: Osseous: No acute bony findings. No bone lesions or fractures. Significant dental caries. Orbits: Negative. No traumatic or inflammatory finding. Sinuses: The paranasal sinuses and mastoid air cells are clear except for a small mucous retention cyst or polyp in the left maxillary sinus. Soft tissues: Negative. Limited intracranial: No significant or unexpected finding. IMPRESSION: Clear paranasal sinuses and mastoid air cells. Dental caries. Electronically Signed   By: Marijo Sanes M.D.   On: 02/23/2019 12:48    Pending Labs Unresulted Labs (From admission, onward)   None      Vitals/Pain Today's Vitals   02/24/19 0535 02/24/19 0536 02/24/19 0537 02/24/19 0538  BP:      Pulse: 84 86 87 89  Resp: (!) 25 (!) 30 (!) 26 (!) 33  Temp:      TempSrc:      SpO2: 91% 91% 92% 90%  Weight:      Height:      PainSc:        Isolation Precautions No active isolations  Medications Medications  apixaban (ELIQUIS) tablet 5 mg (5 mg Oral Given 02/24/19 0037)  atenolol (TENORMIN) tablet 75 mg (75 mg Oral Given 02/24/19 0037)  furosemide (LASIX) tablet 40 mg (40 mg Oral Given 02/23/19 1716)  mycophenolate  (CELLCEPT) capsule 1,500 mg (1,500 mg Oral Given 02/24/19 0034)  verapamil (CALAN-SR) CR tablet 120 mg (120 mg Oral Given 02/24/19 0036)  ipratropium-albuterol (DUONEB) 0.5-2.5 (3) MG/3ML nebulizer solution 3 mL (3 mLs Inhalation Given 02/24/19 0519)  acetaminophen (TYLENOL) tablet 650 mg (has no administration in time range)  albuterol (VENTOLIN HFA) 108 (90 Base) MCG/ACT inhaler 1 puff (has no administration in time range)  cholecalciferol (VITAMIN D3) tablet 1,000 Units (1,000 Units Oral Not Given 02/23/19 1122)  guaiFENesin (MUCINEX) 12 hr tablet 600 mg (600 mg Oral Given 02/23/19 1148)  metFORMIN (GLUCOPHAGE-XR) 24 hr tablet 1,000 mg (1,000 mg Oral Given 02/24/19 0038)  sulfamethoxazole-trimethoprim (BACTRIM DS) 800-160 MG per tablet 1 tablet (has no administration in time range)  methylPREDNISolone sodium succinate (SOLU-MEDROL) 125 mg/2 mL injection 80 mg (80 mg Intravenous Given 02/24/19 0034)  apixaban (ELIQUIS) tablet 5 mg (5 mg Oral Given 02/22/19 2256)  predniSONE (DELTASONE) tablet 60 mg (60 mg Oral Given 02/22/19 2333)  albuterol (PROVENTIL) (2.5 MG/3ML) 0.083% nebulizer solution 5 mg (5 mg Nebulization Given 02/23/19 0014)  ipratropium (ATROVENT) nebulizer solution 0.5 mg (0.5 mg Nebulization Given 02/23/19 0015)  furosemide (LASIX) injection 40 mg (40 mg Intravenous Given 02/23/19 0152)  predniSONE (DELTASONE) tablet 60 mg (60 mg Oral Given 02/23/19 6063)    Mobility non-ambulatory Low fall risk   Focused Assessments Respiratory   R Recommendations: See Admitting Provider Note  Report given to: [place] RN  Additional Notes:

## 2019-02-24 NOTE — ED Notes (Signed)
Patient changed from non rebreather to 15 L [place] with moisture.

## 2019-02-24 NOTE — Progress Notes (Addendum)
PULMONARY / CRITICAL CARE MEDICINE   NAME:  Felicia [name], MRN:  [id number], DOB:  [birth year], LOS: 1 ADMISSION DATE:  02/22/2019, CONSULTATION DATE:  1/16 REFERRING MD:  EDP, CHIEF COMPLAINT:  Light headed, heart pounding   BRIEF HISTORY:    49 yobf with NSIP under care of [facility] with assoc  PAH on tyvaso worse since March 2020 p tapered to pred 20 mg daily and course complicated by R DVT  40/76/80  and refractory "sinus infections" acutely worse Light headed/ sob p working at desk all day 1/15 to ER where did much better on NRM but desats again on NP so PCCM service asked to eval am 1/16   Denied cp/ stated R leg swelling resolved p rx with Eliquis and generalize swelling well controlled with lasix.   HISTORY OF PRESENT ILLNESS   49 y/o female with hx of NSIP (on Cellcept and chronic steroids), pulmonary HTN, dCHF, VTE (on chronic Eliquis), DM presented to the ED for c/o worsening SOB. Patient chronically on 10-15L supplemental O2 at baseline. Usually is able to walk short distances without issue. Notes increased DOE lately with lightheadness following prolonged exertion. Feels a tightness in her central chest with occasional "fluttering" at times when she has worsening dyspnea. Reports experiencing symptoms of a "sinus infection" x 7 months with cough productive of clear phlegm. Last course of abx was 2 weeks pta but no better.   Notes compliance with her daily medications and blood thinners.    [name]             SIGNIFICANT PAST MEDICAL HISTORY    . ILD (interstitial lung disease) ([place]) 03/27/2018  . Palpitations 03/20/2018  . Chronic diastolic HF (heart failure) ([place]) 03/21/2017  . Pulmonary HTN ([place]) 03/21/2017  . Medication management 03/21/2017  . NSIP (nonspecific interstitial pneumonitis) ([place]) 02/03/2017  . Essential hypertension 02/03/2017  . Dyspnea on exertion   . On home oxygen therapy   . PSVT (paroxysmal supraventricular tachycardia) ([place]) 09/23/2014  .  Interstitial pneumonitis ([place]) 09/23/2014  . UNSPEC ALVEOLAR&PARIETOALVEOLAR PNEUMONOPATHY 09/12/2008  . HYPERTENSION 09/11/2008  . BRONCHITIS 09/11/2008     SIGNIFICANT EVENTS:   STUDIES:    Sinus CT 1/16: neg   CULTURES:   Resp viral panel  1/15 neg  MRAA   PCR  1/17 neg   ANTIBIOTICS:     LINES/TUBES:    CONSULTANTS:    SUBJECTIVE:  Comfortable at rest at 30 degrees on 100% NRM but immediate desats/ high wob on 15lpm NP   CONSTITUTIONAL: BP 119/85   Pulse 86   Temp 97.8 F (36.6 C) (Oral)   Resp (!) 27   Ht _0  (1.651 m)   Wt 108.5 kg   SpO2 99%   BMI 39.80 kg/m   I/O last 3 completed shifts: In: -  Out: 400 [Urine:400]        PHYSICAL EXAM:    Pt alert, approp nad @ 60 degrees hob    No jvd Oropharynx clear,  mucosa nl Neck supple Lungs with min crackles in bases/ coarse bs bilaterally with coughing fits on insp maneuvers RRR no s3 or or sign murmur - increased P2  Abd obese soft with nl  excursion  Extr warm with no edema or clubbing noted Neuro  Sensorium nl,  no apparent motor deficits     I personally reviewed images and agree with radiology impression as follows:  Sinus CT 1/16: Clear paranasal sinuses and mastoid air cells.  Dental caries.  RESOLVED PROBLEM LIST   ASSESSMENT AND PLAN   1) Severe chronic resp failure/ hypoxemia secondary NSIP ? Partially steroid responsive >>> 1/16  increased  pred from 20 mg daily  solumedrol 80 mg IV  >>> sinus CT does not indicate why she perceives she can't get 02 thru her nose but this sensation has improved with rx with IV steroids ent eval can be postponed for now.  2) PAH tyvaso and 02 dep so critical she say over 90% sats - ? Proceed with w/u  eg Q scan, echo with bubble study 1/18 ?   3)  DVT R LE 02/03/2019 on eliquis, very unlikely throwing PE's now and no change rx needed    4) aodm flaring on steroids > add SSI, may need lantus also   SUMMARY OF TODAY'S PLAN:  Continue to  wean down 02 for sats > 90%     LABS  Glucose Recent Labs  Lab 02/24/19 0042 02/24/19 0735 02/24/19 1154  GLUCAP 325* 345* 365*    BMET Recent Labs  Lab 02/22/19 2321  NA 139  K 3.8  CL 97*  CO2 28  BUN 16  CREATININE 0.96  GLUCOSE 160*    Liver Enzymes Recent Labs  Lab 02/22/19 2321  AST 62*  ALT 32  ALKPHOS 87  BILITOT 0.5  ALBUMIN 3.8    Electrolytes Recent Labs  Lab 02/22/19 2321  CALCIUM 9.2  MG 1.9    CBC Recent Labs  Lab 02/22/19 2321  WBC 6.8  HGB 12.0  HCT 41.5  PLT 364    ABG Recent Labs  Lab 02/22/19 2252  PHART 7.410  PCO2ART 41.2  PO2ART 92.0    Coag's No results for input(s): APTT, INR in the last 168 hours.  Sepsis Markers No results for input(s): LATICACIDVEN, PROCALCITON, O2SATVEN in the last 168 hours.  Cardiac Enzymes No results for input(s): TROPONINI, PROBNP in the last 168 hours.       Christinia Gully, MD Pulmonary and [place] [phone] After 5:30 PM or weekends, use [facility] [phone]

## 2019-02-25 ENCOUNTER — Inpatient Hospital Stay (HOSPITAL_COMMUNITY): Payer: 59

## 2019-02-25 ENCOUNTER — Encounter (HOSPITAL_COMMUNITY): Payer: Self-pay | Admitting: Internal Medicine

## 2019-02-25 DIAGNOSIS — J9601 Acute respiratory failure with hypoxia: Secondary | ICD-10-CM

## 2019-02-25 LAB — GLUCOSE, CAPILLARY
Glucose-Capillary: 401 mg/dL — ABNORMAL HIGH (ref 70–99)
Glucose-Capillary: 259 mg/dL — ABNORMAL HIGH (ref 70–99)
Glucose-Capillary: 236 mg/dL — ABNORMAL HIGH (ref 70–99)
Glucose-Capillary: 264 mg/dL — ABNORMAL HIGH (ref 70–99)
Glucose-Capillary: 350 mg/dL — ABNORMAL HIGH (ref 70–99)

## 2019-02-25 MED ORDER — INSULIN DETEMIR 100 UNIT/ML ~~LOC~~ SOLN
10.0000 [IU] | Freq: Every day | SUBCUTANEOUS | Status: DC
  Administered 2019-02-25: 10 [IU] via SUBCUTANEOUS
  Filled 2019-02-25 (×2): qty 0.1

## 2019-02-25 NOTE — Progress Notes (Signed)
Inpatient Diabetes Program Recommendations  AACE/ADA: New Consensus Statement on Inpatient Glycemic Control (2015)  Target Ranges:  Prepandial:   less than 140 mg/dL      Peak postprandial:   less than 180 mg/dL (1-2 hours)      Critically ill patients:  140 - 180 mg/dL   Lab Results  Component Value Date   GLUCAP 259 (H) 02/25/2019   HGBA1C 8.6 (H) 02/24/2019    Review of Glycemic Control Results for LINNEA, TODISCO (MRN [id number]) as of 02/25/2019 11:49  Ref. Range 02/24/2019 07:35 02/24/2019 11:54 02/24/2019 17:09 02/24/2019 21:20 02/25/2019 07:39  Glucose-Capillary Latest Ref Range: 70 - 99 mg/dL 345 (H) 365 (H) 243 (H) 256 (H) 259 (H)   Diabetes history: DM 2 Outpatient Diabetes medications: Metformin 1000 mg qhs Current orders for Inpatient glycemic control:  Novolog 0-15 units tid + hs Metformin 1000 mg qhs  Solumedrol 80 mg Q12 hours A1c 8.6% on 1/17  Inpatient Diabetes Program Recommendations:    Consider Levemir 10 units Q 24 hours while on current steroid dose  Thanks,  Tama Headings RN, MSN, BC-ADM Inpatient Diabetes Coordinator Team Pager [phone] (8a-5p)

## 2019-02-25 NOTE — Evaluation (Signed)
Physical Therapy Evaluation Patient Details Name: Felicia [name] MRN: [id number] DOB: 01/28/1971 Today's Date: 02/25/2019   History of Present Illness  49 yo female admitted with acute on chronic respiratory failure, hypoxemia. Hx of CHF, DVT, DM, pulm HTN, nonspecific interstitial pneumonitis  Clinical Impression  On eval in ICU, pt was Min guard assist for bed mobility and Min assist to scoot to [facility]. Pt was on HFNC + NRB 15L. O2 90% on O2. HR fluctuated throughout session 90s-110s. Deferred any further mobility on today. Will continue to follow and progress activity as tolerated.     Follow Up Recommendations Home health PT    Equipment Recommendations  (continuing to assess)    Recommendations for Other Services       Precautions / Restrictions Precautions Precautions: Fall Precaution Comments: on 15L @ baseline Restrictions Weight Bearing Restrictions: No      Mobility  Bed Mobility Overal bed mobility: Needs Assistance Bed Mobility: Supine to Sit;Sit to Supine     Supine to sit: Min guard;HOB elevated Sit to supine: Min assist;HOB elevated      Transfers Min Assist               General transfer comment: Pt scooted towards HOB x 2. O2 dropped to 90%, dyspnea 3/4. HR fluctuated between 90s and 110s. Deferred standing due to HR rhythm/rate alarms  Ambulation/Gait                Stairs            Wheelchair Mobility    Modified Rankin (Stroke Patients Only)       Balance Overall balance assessment: Needs assistance Sitting-balance support: Feet supported Sitting balance-[name]: Good                                       Pertinent Vitals/Pain Pain Assessment: No/denies pain    Home Living Family/patient expects to be discharged to:: Private residence Living Arrangements: Children Available Help at Discharge: Family;Available PRN/intermittently Type of Home: House Home Access: Stairs to enter Entrance  Stairs-Rails: Psychiatric nurse of Steps: 4 Home Layout: Able to live on main level with bedroom/bathroom Home Equipment: None(wedge pillows) Additional Comments: on 15L O2 at baseline    Prior Function Level of Independence: Independent         Comments: walks short distances around home, however per pt report she is primarily sendentary. works from home.     Hand Dominance        Extremity/Trunk Assessment   Upper Extremity Assessment Upper Extremity Assessment: Defer to OT evaluation    Lower Extremity Assessment Lower Extremity Assessment: Generalized weakness    Cervical / Trunk Assessment Cervical / Trunk Assessment: Normal  Communication   Communication: No difficulties  Cognition Arousal/Alertness: Awake/alert Behavior During Therapy: WFL for tasks assessed/performed Overall Cognitive Status: Within Functional Limits for tasks assessed                                        General Comments      Exercises Total Joint Exercises Long Arc Quad: AROM;Both;10 reps;Seated   Assessment/Plan    PT Assessment Patient needs continued PT services  PT Problem List Decreased strength;Decreased mobility;Decreased balance;Decreased activity tolerance;Decreased knowledge of use of DME       PT Treatment Interventions  DME instruction;Gait training;Therapeutic activities;Therapeutic exercise;Patient/family education;Balance training;Functional mobility training    PT Goals (Current goals can be found in the Care Plan section)  Acute Rehab PT Goals Patient Stated Goal: to get better and get back home PT Goal Formulation: With patient Time For Goal Achievement: 03/11/19 Potential to Achieve Goals: Fair    Frequency Min 3X/week   Barriers to discharge        Co-evaluation               AM-PAC PT "6 Clicks" Mobility  Outcome Measure Help needed turning from your back to your side while in a flat bed without using  bedrails?: A Little Help needed moving from lying on your back to sitting on the side of a flat bed without using bedrails?: A Little Help needed moving to and from a bed to a chair (including a wheelchair)?: A Lot Help needed standing up from a chair using your arms (e.g., wheelchair or bedside chair)?: A Lot Help needed to walk in hospital room?: A Lot Help needed climbing 3-5 steps with a railing? : Total 6 Click Score: 13    End of Session Equipment Utilized During Treatment: Oxygen Activity Tolerance: Patient tolerated treatment well Patient left: in bed;with call bell/phone within reach   PT Visit Diagnosis: Muscle weakness (generalized) (M62.81);Difficulty in walking, not elsewhere classified (R26.2)    Time: 0071-2197 PT Time Calculation (min) (ACUTE ONLY): 32 min   Charges:   PT Evaluation $PT Eval Moderate Complexity: 1 Mod PT Treatments $Therapeutic Activity: 8-22 mins          Doreatha Massed, PT Acute Rehabilitation

## 2019-02-25 NOTE — Progress Notes (Signed)
PULMONARY / CRITICAL CARE MEDICINE   NAME:  Harry Bark, MRN:  [id number], DOB:  07-Apr-1970, LOS: 2 ADMISSION DATE:  02/22/2019, CONSULTATION DATE:  1/16 REFERRING MD:  EDP, CHIEF COMPLAINT:  Light headed, heart pounding   BRIEF HISTORY:    2 yobf with NSIP under care of [facility] with assoc  PAH on tyvaso worse since March 2020 p tapered to pred 20 mg daily and course complicated by R DVT  74/12/87  and refractory "sinus infections" acutely worse Light headed/ sob p working at desk all day 1/15 to ER where did much better on NRM but desats again on NP so PCCM service asked to eval am 1/16   Denied cp/ stated R leg swelling resolved p rx with Eliquis and generalize swelling well controlled with lasix.   HISTORY OF PRESENT ILLNESS   49 y/o female with hx of NSIP (on Cellcept and chronic steroids), pulmonary HTN, dCHF, VTE (on chronic Eliquis), DM presented to the ED for c/o worsening SOB. Patient chronically on 10-15L supplemental O2 at baseline. Usually is able to walk short distances without issue. Notes increased DOE lately with lightheadness following prolonged exertion. Feels a tightness in her central chest with occasional "fluttering" at times when she has worsening dyspnea. Reports experiencing symptoms of a "sinus infection" x 7 months with cough productive of clear phlegm. Last course of abx was 2 weeks pta but no better.   Notes compliance with her daily medications and blood thinners.     SIGNIFICANT PAST MEDICAL HISTORY    Past Medical History:  Diagnosis Date  . Diabetes mellitus without complication ([place])    type 2   . Hypertension   . NSIP (nonspecific interstitial pneumonitis) ([place])   . SVT (supraventricular tachycardia) ([facility])      SIGNIFICANT EVENTS:   STUDIES:    Sinus CT 1/16: neg   CULTURES:   Resp viral panel  1/15 neg  MRAA   PCR  1/17 neg   ANTIBIOTICS:    LINES/TUBES:    CONSULTANTS:    SUBJECTIVE:   Comfortable. Still on NRB. Trying to  wean.    CONSTITUTIONAL: BP 121/79   Pulse (!) 28   Temp 98.2 F (36.8 C) (Oral)   Resp (!) 28   Ht 5' 5" (1.651 m)   Wt 108.5 kg   SpO2 (!) 84%   BMI 39.80 kg/m   I/O last 3 completed shifts: In: 68 [P.O.:720] Out: 1700 [Urine:1700]        PHYSICAL EXAM: General appearance: 49 y.o., female, NAD, conversant  Eyes: anicteric sclerae HENT: NCAT; oropharynx, MMM Neck: Trachea midline Lungs: crackles BL insp  CV: RRR, S1, S2, no MRGs  Abdomen: obese pannus, soft, nt  Extremities: No peripheral edema, radial and DP pulses present bilaterally  Skin: Normal temperature, turgor and texture; no rash Psych: Appropriate affect Neuro: Alert and oriented to person and place, no focal deficit    RESOLVED PROBLEM LIST   ASSESSMENT AND PLAN    Severe Chronic Hypoxemic Respiratory Failure Acute exacerbation of ILD  Fibrotic NSIP Chronic immunosuppression  Baseline 19m followed by 155mdaily alternating prednisone dosing  Was receiving rituxan, last dose in June, [place], so she has not received.  Baseline 15L O2 at home per patient  - RVP neg  - continue on solumedrol 8025maily q12H  - will discuss case with one of our ILD colleagues, I spoke with [place] She  needs to increase her mobility  - can restart cellcept 1530m BID  - continue to monitor for infection  - HRCT Chest  - PT OT consult   PAH on tyvaso, WHO Group 3, secondary to lung disease  - lung standing history  - [facility] continue at [facility]   DVT R LE 02/03/2019 on eliquis - remains on full AC    Diabetes Mellitus II, Hyperglycemia, on steroids  - continue ssi  - add qhs levemir   SUMMARY OF TODAY'S PLAN:     LABS  Glucose Recent Labs  Lab 02/24/19 0042 02/24/19 0735 02/24/19 1154 02/24/19 1709 02/24/19 2120 02/25/19 0739  GLUCAP 325* 345* 365* 243* 256* 259*    BMET Recent Labs  Lab 02/22/19 2321  NA 139   K 3.8  CL 97*  CO2 28  BUN 16  CREATININE 0.96  GLUCOSE 160*    Liver Enzymes Recent Labs  Lab 02/22/19 2321  AST 62*  ALT 32  ALKPHOS 87  BILITOT 0.5  ALBUMIN 3.8    Electrolytes Recent Labs  Lab 02/22/19 2321  CALCIUM 9.2  MG 1.9    CBC Recent Labs  Lab 02/22/19 2321  WBC 6.8  HGB 12.0  HCT 41.5  PLT 364    ABG Recent Labs  Lab 02/22/19 2252  PHART 7.410  PCO2ART 41.2  PO2ART 92.0    Coag's No results for input(s): APTT, INR in the last 168 hours.  Sepsis Markers No results for input(s): LATICACIDVEN, PROCALCITON, O2SATVEN in the last 168 hours.  Cardiac Enzymes No results for input(s): TROPONINI, PROBNP in the last 168 hours.    BGarner Nash DO [facility] 02/25/2019 11:56 AM

## 2019-02-26 LAB — GLUCOSE, CAPILLARY
Glucose-Capillary: 248 mg/dL — ABNORMAL HIGH (ref 70–99)
Glucose-Capillary: 304 mg/dL — ABNORMAL HIGH (ref 70–99)
Glucose-Capillary: 357 mg/dL — ABNORMAL HIGH (ref 70–99)
Glucose-Capillary: 260 mg/dL — ABNORMAL HIGH (ref 70–99)

## 2019-02-26 MED ORDER — INSULIN DETEMIR 100 UNIT/ML ~~LOC~~ SOLN
15.0000 [IU] | Freq: Every day | SUBCUTANEOUS | Status: DC
  Administered 2019-02-26 – 2019-02-27 (×2): 15 [IU] via SUBCUTANEOUS
  Filled 2019-02-26 (×4): qty 0.15

## 2019-02-26 MED ORDER — METHYLPREDNISOLONE SODIUM SUCC 40 MG IJ SOLR
40.0000 mg | Freq: Two times a day (BID) | INTRAMUSCULAR | Status: DC
  Administered 2019-02-26 (×2): 40 mg via INTRAVENOUS
  Filled 2019-02-26 (×2): qty 1

## 2019-02-26 NOTE — Progress Notes (Signed)
Received report and assumed care of this patient at 0330. Patient Comfortable, agree with previous nurse's assessment.

## 2019-02-26 NOTE — Evaluation (Signed)
Occupational Therapy Evaluation Patient Details Name: Felicia [name] MRN: [id number] DOB: 1970-11-13 Today's Date: 02/26/2019    History of Present Illness 49 yo female admitted with acute on chronic respiratory failure, hypoxemia. Hx of CHF, DVT, DM, pulm HTN, nonspecific interstitial pneumonitis   Clinical Impression   Pt was admitted for the above. At baseline, she is sedentary, but independent with basic adls on 33-00 LPM. Pt seen on both HFNC and NRB today, 10-15 HF and 15 NRB. She was limited by breathing and extensive rest breaks were given.  Issued squeeze ball and level one putty today. Pt needs extensive assistance with adls due to breathing at this time. Goals in acute are for occasional min A for ADLs and supervision for toileting    Follow Up Recommendations  Supervision/Assistance - 24 hour;Home health OT(depending upon progress)    Equipment Recommendations  3 in 1 bedside commode(for bedside, possibly)    Recommendations for Other Services       Precautions / Restrictions Precautions Precautions: Fall Precaution Comments: on 15L @ baseline.  On HF and NRB now:  monitor sats and RR.  Also went into vtach Restrictions Weight Bearing Restrictions: No      Mobility Bed Mobility         Supine to sit: Supervision        Transfers Overall transfer level: Needs assistance Equipment used: None Transfers: Sit to/from Stand Sit to Stand: Min guard         General transfer comment: SPT to chair with min guard also for safety/lines    Balance                                           ADL either performed or assessed with clinical judgement   ADL Overall ADL's : Needs assistance/impaired Eating/Feeding: Independent   Grooming: Set up   Upper Body Bathing: Moderate assistance   Lower Body Bathing: Maximal assistance   Upper Body Dressing : Moderate assistance   Lower Body Dressing: Maximal assistance   Toilet Transfer: Min  guard;Stand-pivot(chair)             General ADL Comments: increased assistance for adls due to 02 demands; tends to desat with activity.  Using both NRB and HFNC     Vision         Perception     Praxis      Pertinent Vitals/Pain Pain Assessment: Faces Faces Pain Scale: Hurts little more Pain Location: chest Pain Intervention(s): Limited activity within patient's tolerance;Monitored during session(RN present in room)     Hand Dominance     Extremity/Trunk Assessment Upper Extremity Assessment Upper Extremity Assessment: Generalized weakness           Communication Communication Communication: No difficulties   Cognition Arousal/Alertness: Awake/alert Behavior During Therapy: WFL for tasks assessed/performed Overall Cognitive Status: Within Functional Limits for tasks assessed                                     General Comments  issued level one theraputty and squeeze ball for strength, bil.  Sats 86-92% on 15 NRB and 15 HFNC.  short periods of Vtach.  RR 20s and 30s    Exercises     Shoulder Instructions      Home Living Family/patient expects to be  discharged to:: Private residence Living Arrangements: Children Available Help at Discharge: Family;Available PRN/intermittently               Bathroom Shower/Tub: Occupational psychologist: Standard     Home Equipment: Shower seat;Toilet riser(wedges, long sponge, reacher)          Prior Functioning/Environment Level of Independence: Independent        Comments: walks short distances around home, however per pt report she is primarily sendentary. works from home.        OT Problem List: Decreased strength;Decreased activity tolerance;Cardiopulmonary status limiting activity;Decreased knowledge of use of DME or AE      OT Treatment/Interventions: Self-care/ADL training;DME and/or AE instruction;Patient/family education;Therapeutic activities    OT Goals(Current  goals can be found in the care plan section) Acute Rehab OT Goals Patient Stated Goal: to get better and get back home OT Goal Formulation: With patient Time For Goal Achievement: 03/12/19 Potential to Achieve Goals: Fair ADL Goals Pt Will Transfer to Toilet: with supervision;ambulating;bedside commode(and hygiene) Additional ADL Goal #1: pt will perform ADL wtih AE with occasional min A and she will initiate at least 2 rest breaks for energy conservation Additional ADL Goal #2: pt will be independent with bil UE strengthening program, level 1-2 theraband and level 1 theraputty  OT Frequency: Min 2X/week   Barriers to D/C:            Co-evaluation              AM-PAC OT "6 Clicks" Daily Activity     Outcome Measure Help from another person eating meals?: None Help from another person taking care of personal grooming?: A Little Help from another person toileting, which includes using toliet, bedpan, or urinal?: A Lot Help from another person bathing (including washing, rinsing, drying)?: A Lot Help from another person to put on and taking off regular upper body clothing?: A Lot Help from another person to put on and taking off regular lower body clothing?: A Lot 6 Click Score: 15   End of Session    Activity Tolerance: Patient limited by fatigue(and sats) Patient left: in chair;with call bell/phone within reach;with nursing/sitter in room  OT Visit Diagnosis: Muscle weakness (generalized) (M62.81)                Time: 1423-1500 OT Time Calculation (min): 37 min Charges:  OT General Charges $OT Visit: 1 Visit OT Evaluation $OT Eval Low Complexity: 1 Low OT Treatments $Therapeutic Activity: 8-22 mins  Ozan Maclay S, OTR/L Acute Rehabilitation Services 02/26/2019  [place] 02/26/2019, 3:22 PM

## 2019-02-26 NOTE — Plan of Care (Signed)

## 2019-02-26 NOTE — Progress Notes (Addendum)
PULMONARY / CRITICAL CARE MEDICINE   NAME:  Felicia [name], MRN:  [id number], DOB:  Nov 10, 1970, LOS: 3 ADMISSION DATE:  02/22/2019, CONSULTATION DATE:  1/16 REFERRING MD:  EDP, CHIEF COMPLAINT:  Light headed, heart pounding   BRIEF HISTORY:    31 yobf with NSIP under care of [facility] with assoc  PAH on tyvaso worse since March 2020 p tapered to pred 20 mg daily and course complicated by R DVT  00/86/76  and refractory "sinus infections" acutely worse Light headed/ sob p working at desk all day 1/15 to ER where did much better on NRM but desats again on NP so PCCM service asked to eval am 1/16   Denied cp/ stated R leg swelling resolved p rx with Eliquis and generalize swelling well controlled with lasix.   HISTORY OF PRESENT ILLNESS   49 y/o female with hx of NSIP (on Cellcept and chronic steroids), pulmonary HTN, dCHF, VTE (on chronic Eliquis), DM presented to the ED for c/o worsening SOB. Patient chronically on 10-15L supplemental O2 at baseline. Usually is able to walk short distances without issue. Notes increased DOE lately with lightheadness following prolonged exertion. Feels a tightness in her central chest with occasional "fluttering" at times when she has worsening dyspnea. Reports experiencing symptoms of a "sinus infection" x 7 months with cough productive of clear phlegm. Last course of abx was 2 weeks pta but no better.   Notes compliance with her daily medications and blood thinners.     SIGNIFICANT PAST MEDICAL HISTORY    Past Medical History:  Diagnosis Date  . Diabetes mellitus without complication ([place])    type 2   . Hypertension   . NSIP (nonspecific interstitial pneumonitis) ([place])   . SVT (supraventricular tachycardia) ([facility])      SIGNIFICANT EVENTS:  Admitted 1/15  STUDIES:   Sinus CT 1/16 > neg  HRCT 1/18 >  1. Spectrum of findings compatible with chronic fibrotic interstitial lung disease with small focus of honeycombing. No clear apicobasilar gradient.  Findings are stable to mildly progressed since 2018 chest CT study, with clear progression since remote 2010 chest CT. Findings are compatible with biopsy-proven fibrotic nonspecific interstitial pneumonia (NSIP). Findings are suggestive of an alternative diagnosis (not UIP) per consensus guidelines: Diagnosis of Idiopathic Pulmonary Fibrosis: An Official ATS/ERS/JRS/ALAT Clinical Practice Guideline. [place], Iss 5, [id number], Oct 08 2016. 2. No acute superimposed pulmonary process. 3. Cardiomegaly. Stable dilated main pulmonary chronic pulmonary artery arterial hypertension., suggesting  CULTURES:  Resp viral panel  1/15 > neg  MRAA  PCR  1/17 > neg   ANTIBIOTICS:  N/A  LINES/TUBES:     CONSULTANTS:  PCCM   SUBJECTIVE:  RN reports no acute events overnight, patient states she feels much better.     CONSTITUTIONAL: BP (!) 119/96 (BP Location: Right Arm)   Pulse 78   Temp 98.3 F (36.8 C) (Oral)   Resp 18   Ht _0  (1.651 m)   Wt 108.5 kg   SpO2 96%   BMI 39.80 kg/m   I/O last 3 completed shifts: In: 900 [P.O.:900] Out: 1900 [Urine:1900]        PHYSICAL EXAM: General: Chronically ill appearing elderly female lying in bed on HFNC/NRB , in NAD HEENT: [place]/AT, MM pink/moist, PERRL,  Neuro: Alert and oriented x3, able to follow all commands, non-focal  CV: s1s2 regular rate and rhythm, no murmur, rubs, or gallops,  PULM:  Clear to ascultation bilaterally,  diminished air entry, no added breath sounds. Patient states she in near baseline  GI: soft, bowel sounds active in all 4 quadrants, non-tender, non-distended Extremities: warm/dry, no edema  Skin: no rashes or lesions  RESOLVED PROBLEM LIST   ASSESSMENT AND PLAN    Severe Chronic Hypoxemic Respiratory Failure Acute exacerbation of ILD  Fibrotic NSIP Chronic immunosuppression  -Baseline 44m followed by 174mdaily alternating prednisone dosing  -Was receiving rituxan, last dose  in June, [place] for [place], so she has not received.  -Baseline 15L O2 at home per patient  -RVP negative  P: Continue IV solumedrol   Again encouraged further ambulation today  Continue Cellcept  Follow for infection  HRCT compatible with chronic fibrotic interstitial lung disease  PT/OT  PAH on tyvaso, WHO Group 3, secondary to lung disease  - [facility] Appears at baseline   DVT R LE 02/03/2019 on eliquis Continue PO eliquis   Diabetes Mellitus II, Hyperglycemia, on steroids  SSI and Levemiri    SUMMARY OF TODAY'S PLAN:     LABS  Glucose Recent Labs  Lab 02/24/19 2120 02/25/19 0739 02/25/19 1206 02/25/19 1605 02/25/19 2105 02/26/19 0806  GLUCAP 256* 259* 236* 264* 350* 248*    BMET Recent Labs  Lab 02/22/19 2321  NA 139  K 3.8  CL 97*  CO2 28  BUN 16  CREATININE 0.96  GLUCOSE 160*    Liver Enzymes Recent Labs  Lab 02/22/19 2321  AST 62*  ALT 32  ALKPHOS 87  BILITOT 0.5  ALBUMIN 3.8    Electrolytes Recent Labs  Lab 02/22/19 2321  CALCIUM 9.2  MG 1.9    CBC Recent Labs  Lab 02/22/19 2321  WBC 6.8  HGB 12.0  HCT 41.5  PLT 364    ABG Recent Labs  Lab 02/22/19 2252  PHART 7.410  PCO2ART 41.2  PO2ART 92.0    Coag's No results for input(s): APTT, INR in the last 168 hours.  Sepsis Markers No results for input(s): LATICACIDVEN, PROCALCITON, O2SATVEN in the last 168 hours.  Cardiac Enzymes No results for input(s): TROPONINI, PROBNP in the last 168 hours.    WhJohnsie CancelNP-C [facility] Contact / Pager information can be found on Amion  02/26/2019, 9:21 AM   Pulmonary critical care attending:  [age]ear old female with 10-year history of fibrotic NSIP on chronic immunosuppression CellCept plus steroids plus every 4 month Rituxan last dose June 2020, unable to obtain her fall dosing this past year.  Also has diabetes likely secondary  to chronic steroid use.  Patient also morbidly obese.  Not a candidate for transplantation due to her weight.  Currently admitted for an ILD exacerbation.  On high-dose IV steroids.  Overall improving closer to her baseline today.  BP 111/79   Pulse 94   Temp 98.3 F (36.8 C) (Oral)   Resp (!) 29   Ht _0  (1.651 m)   Wt 108.5 kg   SpO2 99%   BMI 39.80 kg/m   General: Obese female resting in bed no distress on high oxygen requirements. Neck: Large Chest: Regular rate rhythm S1-S2 Lungs: Bilateral inspiratory crackles in the bases Abdomen: Soft nontender nondistended Extremities: No significant edema  Labs: Reviewed, persistent hyperglycemia Imaging: HRCT fibrotic interstitial changes, small areas of honeycombing no significant groundglass, some minor areas.  Relatively stable in comparison to previous chest imaging The patient's images have been  independently reviewed by me.    Assessment: ILD exacerbation, baseline fibrotic NSIP on chronic immune suppression Type 2 diabetes with hyperglycemia Steroid-induced hyperglycemia Morbid obesity, BMI 39, not a candidate for transplantation due to weight.  Plan: De-escalate IV Solu-Medrol to 40 mg every 12 hours. Continue mycophenolate 1500 mg, twice daily Continue Bactrim prophylaxis with Monday Wednesday Friday dosing. Increased Levemir to 15 units. Continue beta-blockade plus furosemide, appears euvolemic. Continue PT OT Up in chair as much as tolerated. Would like to consider potential discharge for Thursday.  Need to see how she is going to do with weaning of steroid.  Garner Nash, DO [facility] 02/26/2019 10:54 AM

## 2019-02-26 NOTE — Progress Notes (Signed)
During patient transfer from bedside to chair with PT assistance, the patient desaturated to 80% & tachypnic. O2 was increased to 15L both HFNC, non-rebreather mask and pt complaining of SOB. Patient was asked to do deep breaths and relaxation. O2 now at 90's and patient said she feels better while seating.

## 2019-02-26 NOTE — Progress Notes (Signed)
Inpatient Diabetes Program Recommendations  AACE/ADA: New Consensus Statement on Inpatient Glycemic Control (2015)  Target Ranges:  Prepandial:   less than 140 mg/dL      Peak postprandial:   less than 180 mg/dL (1-2 hours)      Critically ill patients:  140 - 180 mg/dL   Lab Results  Component Value Date   GLUCAP 248 (H) 02/26/2019   HGBA1C 8.6 (H) 02/24/2019    Review of Glycemic Control  Blood sugar this am - 248 mg/dL Adjust Levemir.  Inpatient Diabetes Program Recommendations:     Increase Levemir to 15 units QHS  Will continue to follow glucose trends.  Thank you. Lorenda Peck, RD, LDN, CDE Inpatient Diabetes Coordinator [phone]

## 2019-02-27 DIAGNOSIS — R0602 Shortness of breath: Secondary | ICD-10-CM

## 2019-02-27 LAB — GLUCOSE, CAPILLARY
Glucose-Capillary: 217 mg/dL — ABNORMAL HIGH (ref 70–99)
Glucose-Capillary: 309 mg/dL — ABNORMAL HIGH (ref 70–99)
Glucose-Capillary: 341 mg/dL — ABNORMAL HIGH (ref 70–99)
Glucose-Capillary: 244 mg/dL — ABNORMAL HIGH (ref 70–99)

## 2019-02-27 MED ORDER — METHYLPREDNISOLONE SODIUM SUCC 40 MG IJ SOLR
40.0000 mg | Freq: Every day | INTRAMUSCULAR | Status: DC
  Administered 2019-02-27 – 2019-03-03 (×5): 40 mg via INTRAVENOUS
  Filled 2019-02-27 (×5): qty 1

## 2019-02-27 NOTE — Progress Notes (Addendum)
PULMONARY / CRITICAL CARE MEDICINE   NAME:  Felicia [name], MRN:  [id number], DOB:  Apr 22, 1970, LOS: 4 ADMISSION DATE:  02/22/2019, CONSULTATION DATE:  1/16 REFERRING MD:  EDP, CHIEF COMPLAINT:  Light headed, heart pounding   BRIEF HISTORY:    88 yobf with NSIP under care of [facility] with assoc  PAH on tyvaso worse since March 2020 p tapered to pred 20 mg daily and course complicated by R DVT  92/42/68  and refractory "sinus infections" acutely worse Light headed/ sob p working at desk all day 1/15 to ER where did much better on NRM but desats again on NP so PCCM service asked to eval am 1/16   Denied cp/ stated R leg swelling resolved p rx with Eliquis and generalize swelling well controlled with lasix.   HISTORY OF PRESENT ILLNESS   49 y/o female with hx of NSIP (on Cellcept and chronic steroids), pulmonary HTN, dCHF, VTE (on chronic Eliquis), DM presented to the ED for c/o worsening SOB. Patient chronically on 10-15L supplemental O2 at baseline. Usually is able to walk short distances without issue. Notes increased DOE lately with lightheadness following prolonged exertion. Feels a tightness in her central chest with occasional "fluttering" at times when she has worsening dyspnea. Reports experiencing symptoms of a "sinus infection" x 7 months with cough productive of clear phlegm. Last course of abx was 2 weeks pta but no better.   Notes compliance with her daily medications and blood thinners.     SIGNIFICANT PAST MEDICAL HISTORY    Past Medical History:  Diagnosis Date  . Diabetes mellitus without complication ([place])    type 2   . Hypertension   . NSIP (nonspecific interstitial pneumonitis) ([place])   . SVT (supraventricular tachycardia) ([facility])      SIGNIFICANT EVENTS:  Admitted 1/15  STUDIES:   Sinus CT 1/16 > neg  HRCT 1/18 >  1. Spectrum of findings compatible with chronic fibrotic interstitial lung disease with small focus of honeycombing. No clear apicobasilar gradient.  Findings are stable to mildly progressed since 2018 chest CT study, with clear progression since remote 2010 chest CT. Findings are compatible with biopsy-proven fibrotic nonspecific interstitial pneumonia (NSIP). Findings are suggestive of an alternative diagnosis (not UIP) per consensus guidelines: Diagnosis of Idiopathic Pulmonary Fibrosis: An Official ATS/ERS/JRS/ALAT Clinical Practice Guideline. [place], Iss 5, [phone], Oct 08 2016. 2. No acute superimposed pulmonary process. 3. Cardiomegaly. Stable dilated main pulmonary chronic pulmonary artery arterial hypertension., suggesting  CULTURES:  Resp viral panel  1/15 > neg  MRAA  PCR  1/17 > neg   ANTIBIOTICS:  N/A  LINES/TUBES:     CONSULTANTS:  PCCM   SUBJECTIVE:  Patient continues to report slight daily improvement, reports she sat in bedside recliner four hours yesterday.   CONSTITUTIONAL: BP (!) 149/90   Pulse (!) 49   Temp (!) 96.8 F (36 C) (Axillary)   Resp 17   Ht _0  (1.651 m)   Wt 107.5 kg   SpO2 98%   BMI 39.44 kg/m   I/O last 3 completed shifts: In: 1800 [P.O.:1800] Out: 2250 [Urine:2250]        PHYSICAL EXAM: General: Chronically ill appearing elderly female lying in bed, in NAD HEENT: [place]/AT, NRB and HFNC in place  MM pink/moist, PERRL,  Neuro: Alert and oriented x3, able to follow all commands, non-focal  CV: s1s2 regular rate and rhythm, no murmur, rubs, or gallops,  PULM:  No  added breath, diminished air entry, no increased work of breathng GI: soft, bowel sounds active in all 4 quadrants, non-tender, non-distended Extremities: warm/dry, no edema  Skin: no rashes or lesions   RESOLVED PROBLEM LIST   ASSESSMENT AND PLAN    Severe Chronic Hypoxemic Respiratory Failure Acute exacerbation of ILD  Fibrotic NSIP Chronic immunosuppression  -Baseline 58m followed by 133mdaily alternating prednisone dosing  -Was receiving rituxan, last dose in June, was  [place], so she has not received.  -Baseline 15L O2 at home per patient  -RVP negative  -HRCT compatible with chronic fibrotic interstitial lung disease  P: Decreased solu-medrol to 40 once daily  Continued encouragement of frequent ambulation Attempt to remove NRB and [facility] Cellcept PT/OT   PAH on tyvaso, WHO Group 3, secondary to lung disease  - follows [place] Dr. [place]illes [place] to appear at baseline   DVT R LE 02/03/2019 on eliquis P: PO eliquis   Diabetes Mellitus II, Hyperglycemia, on steroids  P: SSI and Levemir   SUMMARY OF TODAY'S PLAN:  Continue current regiment with IV steroids and high supplemental oxygen replacement.   LABS  Glucose Recent Labs  Lab 02/25/19 2105 02/26/19 0806 02/26/19 1221 02/26/19 1605 02/26/19 2143 02/27/19 0747  GLUCAP 350* 248* 304* 357* 260* 217*    BMET Recent Labs  Lab 02/22/19 2321  NA 139  K 3.8  CL 97*  CO2 28  BUN 16  CREATININE 0.96  GLUCOSE 160*    Liver Enzymes Recent Labs  Lab 02/22/19 2321  AST 62*  ALT 32  ALKPHOS 87  BILITOT 0.5  ALBUMIN 3.8    Electrolytes Recent Labs  Lab 02/22/19 2321  CALCIUM 9.2  MG 1.9    CBC Recent Labs  Lab 02/22/19 2321  WBC 6.8  HGB 12.0  HCT 41.5  PLT 364    ABG Recent Labs  Lab 02/22/19 2252  PHART 7.410  PCO2ART 41.2  PO2ART 92.0    Coag's No results for input(s): APTT, INR in the last 168 hours.  Sepsis Markers No results for input(s): LATICACIDVEN, PROCALCITON, O2SATVEN in the last 168 hours.  Cardiac Enzymes No results for input(s): TROPONINI, PROBNP in the last 168 hours.   WhJohnsie CancelNP-C [facility] Contact / Pager information can be found on Amion  02/27/2019, 8:28 AM   PCCM attending:  [age]ear old female, fibrotic NSIP chronic immunosuppression CellCept plus steroids, Rituxan last dose June 2020.  Admitted for ILD  exacerbation.  Currently on IV tapering Solu-Medrol.  Slowly improving.  Able to get up out of the bed into chair yesterday.  Encouraged today to do the same try to eat 3 meals up in the chair at bedside.  BP 127/90   Pulse 73   Temp (!) 96.8 F (36 C) (Axillary)   Resp (!) 27   Ht _0  (1.651 m)   Wt 107.5 kg   SpO2 95%   BMI 39.44 kg/m   General: Obese female resting in bed no distress on high flow nasal cannula Neck: Large, Chest: Regular rate rhythm, S1-S2 Lungs: Bilateral inspiratory crackles Abdomen: Soft nontender nondistended Extremities: No significant edema  Labs: Reviewed Imaging: HRCT chronic interstitial changes.  Assessment: ILD exacerbation, baseline fibrotic NSIP, on chronic immunosuppression Type 2 diabetes with hyperglycemia Steroid-induced hyperglycemia  Plan: De-escalate IV Solu-Medrol to 40 mg daily Continue mycophenolate 1500 mg twice daily  Bactrim prophylaxis Monday Wednesday Friday Levemir plus SSI for glucose control Continue beta-blocker, daily furosemide for euvolemic status Continue PT OT Encourage up to chair as much as tolerated today. Encourage patient to get up to the chair to eat breakfast lunch and dinner at bedside instead of lying in the bed. Continue to be aggressive at weaning oxygen. Maintain SPO2 greater than 88% for target goal.  I suspect patient will be in the hospital for a few more days as we attempt to wean IV steroids and get her back to a baseline home regimen that she is comfortable with and her O2 requirements have decreased.  Garner Nash, DO [facility] 02/27/2019 9:30 AM

## 2019-02-27 NOTE — Progress Notes (Signed)
Occupational Therapy Treatment Patient Details Name: Felicia [name] MRN: [id number] DOB: September 10, 1970 Today's Date: 02/27/2019    History of present illness 49 yo female admitted with acute on chronic respiratory failure, hypoxemia. Hx of CHF, DVT, DM, pulm HTN, nonspecific interstitial pneumonitis   OT comments  Improved activity tolerance. Worked on Dance movement psychotherapist (for energy conservation) and sit to stand as precursor for ADLs and to build activity tolerance  Follow Up Recommendations  Home health OT;Supervision/Assistance - 24 hour    Equipment Recommendations  3 in 1 bedside commode(for bedside, if needed)    Recommendations for Other Services      Precautions / Restrictions Precautions Precautions: Fall Precaution Comments: on 15L @ baseline.  On HF and NRB now:  monitor sats and RR.  Also went into vtach Restrictions Weight Bearing Restrictions: No       Mobility Bed Mobility               General bed mobility comments: up in recliner  Transfers   Equipment used: None   Sit to Stand: Supervision         General transfer comment: for lines    Balance                                           ADL either performed or assessed with clinical judgement   ADL                                         General ADL Comments: used and issued sock aide for energy conservation.   worked on sit to stand for Marine scientist for [employer]. Pt is good about initiating and taking rest breaks. Improved ADL/breathing tolerance today     Vision       Perception     Praxis      Cognition Arousal/Alertness: Awake/alert Behavior During Therapy: WFL for tasks assessed/performed Overall Cognitive Status: Within Functional Limits for tasks assessed                                          Exercises Exercises: (pt reports she is using squeeze ball for bil UEs)   Shoulder Instructions       General Comments on  NRB and HFNC  Sats 84-94%.  Had some PVCs--HR in 70s/80s.  RR mostly in twenties.      Pertinent Vitals/ Pain       Pain Assessment: No/denies pain  Home Living                                          Prior Functioning/Environment              Frequency  Min 2X/week        Progress Toward Goals  OT Goals(current goals can now be found in the care plan section)  Progress towards OT goals: Progressing toward goals     Plan      Co-evaluation    PT/OT/SLP Co-Evaluation/Treatment: Yes Reason for Co-Treatment: For patient/therapist safety PT goals addressed during session: Mobility/safety with mobility OT goals addressed  during session: ADL's and self-care      AM-PAC OT "6 Clicks" Daily Activity     Outcome Measure   Help from another person eating meals?: None Help from another person taking care of personal grooming?: A Little Help from another person toileting, which includes using toliet, bedpan, or urinal?: A Lot Help from another person bathing (including washing, rinsing, drying)?: A Little Help from another person to put on and taking off regular upper body clothing?: A Little Help from another person to put on and taking off regular lower body clothing?: A Little 6 Click Score: 18    End of Session    OT Visit Diagnosis: Muscle weakness (generalized) (M62.81)   Activity Tolerance Patient tolerated treatment well   Patient Left in chair;with call bell/phone within reach;with nursing/sitter in room   Nurse Communication          Time: 3419-6222 OT Time Calculation (min): 24 min  Charges: OT General Charges $OT Visit: 1 Visit OT Treatments $Self Care/Home Management : 8-22 mins  [place], OTR/L Acute Rehabilitation Services 02/27/2019   [place] 02/27/2019, 1:03 PM

## 2019-02-27 NOTE — Progress Notes (Signed)
Physical Therapy Treatment Patient Details Name: Felicia [name] MRN: [id number] DOB: [birth year] Today's Date: 02/27/2019    History of Present Illness 49 yo female admitted with acute on chronic respiratory failure, hypoxemia. Hx of CHF, DVT, DM, pulm HTN, nonspecific interstitial pneumonitis    PT Comments    Pt continues to participate as best she can. She remains on HFNC and NRB O2.    Follow Up Recommendations  Home health PT     Equipment Recommendations  (continuing to assess)    Recommendations for Other Services       Precautions / Restrictions Precautions Precautions: Fall Precaution Comments: on 15L @ baseline.  On HFNC and NRB now. Restrictions Weight Bearing Restrictions: No    Mobility  Bed Mobility               General bed mobility comments: oob in recliner  Transfers Overall transfer level: Needs assistance Equipment used: None Transfers: Sit to/from Stand Sit to Stand: Supervision         General transfer comment: Sit to stand x 5 for strengthening, activity tolerance. Seated rest break of ~1-2 minutes between each stand.  Ambulation/Gait             General Gait Details: Nt-pt not yet able to tolerate   Stairs             Wheelchair Mobility    Modified Rankin (Stroke Patients Only)       Balance Overall balance assessment: Needs assistance           Standing balance-[name]: Fair                              Cognition Arousal/Alertness: Awake/alert Behavior During Therapy: WFL for tasks assessed/performed Overall Cognitive Status: Within Functional Limits for tasks assessed                                        Exercises      General Comments General comments (skin integrity, edema, etc.): on NRB and HFNC  Sats 84-94%.  Had some PVCs--HR in 70s/80s.  RR mostly in twenties.        Pertinent Vitals/Pain Pain Assessment: No/denies pain    Home Living                       Prior Function            PT Goals (current goals can now be found in the care plan section) Progress towards PT goals: Progressing toward goals    Frequency    Min 3X/week      PT Plan Current plan remains appropriate    Co-evaluation   Reason for Co-Treatment: For patient/therapist safety PT goals addressed during session: Mobility/safety with mobility OT goals addressed during session: ADL's and self-care      AM-PAC PT "6 Clicks" Mobility   Outcome Measure  Help needed turning from your back to your side while in a flat bed without using bedrails?: A Little Help needed moving from lying on your back to sitting on the side of a flat bed without using bedrails?: A Little Help needed moving to and from a bed to a chair (including a wheelchair)?: A Little Help needed standing up from a chair using your arms (e.g., wheelchair or bedside chair)?: A Little  Help needed to walk in hospital room?: A Lot Help needed climbing 3-5 steps with a railing? : A Lot 6 Click Score: 16    End of Session Equipment Utilized During Treatment: Oxygen Activity Tolerance: Patient tolerated treatment well Patient left: in chair;with call bell/phone within reach   PT Visit Diagnosis: Muscle weakness (generalized) (M62.81);Difficulty in walking, not elsewhere classified (R26.2)     Time: 3903-0092 PT Time Calculation (min) (ACUTE ONLY): 24 min  Charges:  $Therapeutic Activity: 8-22 mins                        Doreatha Massed, PT Acute Rehabilitation

## 2019-02-28 LAB — BASIC METABOLIC PANEL
Anion gap: 12 (ref 5–15)
BUN: 31 mg/dL — ABNORMAL HIGH (ref 6–20)
CO2: 39 mmol/L — ABNORMAL HIGH (ref 22–32)
Calcium: 8.4 mg/dL — ABNORMAL LOW (ref 8.9–10.3)
Chloride: 91 mmol/L — ABNORMAL LOW (ref 98–111)
Creatinine, Ser: 0.94 mg/dL (ref 0.44–1.00)
GFR calc Af Amer: >60 mL/min (ref >60)
GFR calc non Af Amer: >60 mL/min (ref >60)
Glucose, Bld: 167 mg/dL — ABNORMAL HIGH (ref 70–99)
Potassium: 4 mmol/L (ref 3.5–5.1)
Sodium: 142 mmol/L (ref 135–145)

## 2019-02-28 LAB — GLUCOSE, CAPILLARY
Glucose-Capillary: 125 mg/dL — ABNORMAL HIGH (ref 70–99)
Glucose-Capillary: 263 mg/dL — ABNORMAL HIGH (ref 70–99)
Glucose-Capillary: 158 mg/dL — ABNORMAL HIGH (ref 70–99)
Glucose-Capillary: 238 mg/dL — ABNORMAL HIGH (ref 70–99)

## 2019-02-28 LAB — CBC
HCT: 42 % (ref 36.0–46.0)
Hemoglobin: 12 g/dL (ref 12.0–15.0)
MCH: 24 pg — ABNORMAL LOW (ref 26.0–34.0)
MCHC: 28.6 g/dL — ABNORMAL LOW (ref 30.0–36.0)
MCV: 84.2 fL (ref 80.0–100.0)
Platelets: 358 10*3/uL (ref 150–400)
RBC: 4.99 MIL/uL (ref 3.87–5.11)
RDW: 16.2 % — ABNORMAL HIGH (ref 11.5–15.5)
WBC: 13.4 10*3/uL — ABNORMAL HIGH (ref 4.0–10.5)
nRBC: 0.2 % (ref 0.0–0.2)

## 2019-02-28 LAB — PROCALCITONIN: Procalcitonin: <0.1 ng/mL

## 2019-02-28 MED ORDER — INSULIN DETEMIR 100 UNIT/ML ~~LOC~~ SOLN
12.0000 [IU] | Freq: Two times a day (BID) | SUBCUTANEOUS | Status: DC
  Administered 2019-02-28 – 2019-03-03 (×7): 12 [IU] via SUBCUTANEOUS
  Filled 2019-02-28 (×8): qty 0.12

## 2019-02-28 MED ORDER — INSULIN ASPART 100 UNIT/ML ~~LOC~~ SOLN
3.0000 [IU] | Freq: Three times a day (TID) | SUBCUTANEOUS | Status: DC
  Administered 2019-02-28 – 2019-03-03 (×9): 3 [IU] via SUBCUTANEOUS

## 2019-02-28 MED ORDER — DIPHENHYDRAMINE HCL 50 MG/ML IJ SOLN: 50.0000 mg | Freq: Once | INTRAMUSCULAR | Status: DC | PRN

## 2019-02-28 MED ORDER — SODIUM CHLORIDE 0.9 % IV SOLN
1000.0000 mg | Freq: Once | INTRAVENOUS | Status: AC
  Administered 2019-02-28: 1000 mg via INTRAVENOUS
  Filled 2019-02-28: qty 100

## 2019-02-28 MED ORDER — ALBUTEROL SULFATE (2.5 MG/3ML) 0.083% IN NEBU: 2.5000 mg | INHALATION_SOLUTION | Freq: Once | RESPIRATORY_TRACT | Status: DC | PRN

## 2019-02-28 MED ORDER — FAMOTIDINE IN NACL 20-0.9 MG/50ML-% IV SOLN: 20.0000 mg | Freq: Once | INTRAVENOUS | Status: DC | PRN

## 2019-02-28 MED ORDER — SODIUM CHLORIDE 0.9 % IV BOLUS: 1000.0000 mL | Freq: Once | INTRAVENOUS | Status: DC | PRN

## 2019-02-28 MED ORDER — METHYLPREDNISOLONE SODIUM SUCC 125 MG IJ SOLR: 125.0000 mg | Freq: Once | INTRAMUSCULAR | Status: DC | PRN

## 2019-02-28 MED ORDER — DIPHENHYDRAMINE HCL 50 MG PO CAPS
50.0000 mg | ORAL_CAPSULE | Freq: Once | ORAL | Status: AC
  Administered 2019-02-28: 12:00:00 50 mg via ORAL
  Filled 2019-02-28: qty 1

## 2019-02-28 MED ORDER — EPINEPHRINE PF 1 MG/ML IJ SOLN
0.3000 mg | INTRAMUSCULAR | Status: DC | PRN
  Filled 2019-02-28: qty 1

## 2019-02-28 MED ORDER — TREPROSTINIL 0.6 MG/ML IN SOLN
0.6000 ug | RESPIRATORY_TRACT | Status: DC
  Administered 2019-02-28 – 2019-03-02 (×7): 0.6 ug via RESPIRATORY_TRACT

## 2019-02-28 MED ORDER — ACETAMINOPHEN 325 MG PO TABS
650.0000 mg | ORAL_TABLET | Freq: Once | ORAL | Status: AC
  Administered 2019-02-28: 650 mg via ORAL
  Filled 2019-02-28: qty 2

## 2019-02-28 NOTE — Progress Notes (Signed)
Inpatient Diabetes Program Recommendations  AACE/ADA: New Consensus Statement on Inpatient Glycemic Control (2015)  Target Ranges:  Prepandial:   less than 140 mg/dL      Peak postprandial:   less than 180 mg/dL (1-2 hours)      Critically ill patients:  140 - 180 mg/dL   Lab Results  Component Value Date   GLUCAP 125 (H) 02/28/2019   HGBA1C 8.6 (H) 02/24/2019    Review of Glycemic Control  Post-prandial blood sugars elevated. May benefit from adding Novolog 3-4 units tidwc for meal coverage insulin.  Inpatient Diabetes Program Recommendations:     Add Novolog 3-4 units tidwc for meal coverage insulin if pt eats > 50% meal.  Will continue to follow closely.  Thank you. Lorenda Peck, RD, LDN, CDE Inpatient Diabetes Coordinator [phone]

## 2019-02-28 NOTE — Progress Notes (Addendum)
PULMONARY / CRITICAL CARE MEDICINE   NAME:  Felicia [name], MRN:  [id number], DOB:  1970-10-27, LOS: 5 ADMISSION DATE:  02/22/2019, CONSULTATION DATE:  1/16 REFERRING MD:  EDP, CHIEF COMPLAINT:  Light headed, heart pounding   BRIEF HISTORY:    79 yobf with NSIP under care of [place] [facility] [facility] with assoc  PAH on tyvaso worse since March 2020 p tapered to pred 20 mg daily and course complicated by R DVT  92/01/00  and refractory "sinus infections" acutely worse Light headed/ sob p working at desk all day 1/15 to ER where did much better on NRM but desats again on NP so PCCM service asked to eval am 1/16   Denied cp/ stated R leg swelling resolved p rx with Eliquis and generalize swelling well controlled with lasix.   HISTORY OF PRESENT ILLNESS   49 y/o female with hx of NSIP (on Cellcept and chronic steroids), pulmonary HTN, dCHF, VTE (on chronic Eliquis), DM presented to the ED for c/o worsening SOB. Patient chronically on 10-15L supplemental O2 at baseline. Usually is able to walk short distances without issue. Notes increased DOE lately with lightheadness following prolonged exertion. Feels a tightness in her central chest with occasional "fluttering" at times when she has worsening dyspnea. Reports experiencing symptoms of a "sinus infection" x 7 months with cough productive of clear phlegm. Last course of abx was 2 weeks pta but no better.   Notes compliance with her daily medications and blood thinners.     SIGNIFICANT PAST MEDICAL HISTORY    Past Medical History:  Diagnosis Date  . Diabetes mellitus without complication ([place])    type 2   . Hypertension   . NSIP (nonspecific interstitial pneumonitis) ([place])   . SVT (supraventricular tachycardia) ([facility])      SIGNIFICANT EVENTS:  Admitted 1/15  STUDIES:   Sinus CT 1/16 > neg  HRCT 1/18 >  1. Spectrum of findings compatible with chronic fibrotic interstitial lung disease with small focus of honeycombing. No clear apicobasilar gradient.  Findings are stable to mildly progressed since 2018 chest CT study, with clear progression since remote 2010 chest CT. Findings are compatible with biopsy-proven fibrotic nonspecific interstitial pneumonia (NSIP). Findings are suggestive of an alternative diagnosis (not UIP) per consensus guidelines: Diagnosis of Idiopathic Pulmonary Fibrosis: An Official ATS/ERS/JRS/ALAT Clinical Practice Guideline. [place], Iss 5, [phone], Oct 08 2016. 2. No acute superimposed pulmonary process. 3. Cardiomegaly. Stable dilated main pulmonary chronic pulmonary artery arterial hypertension., suggesting  CULTURES:  Resp viral panel  1/15 > neg  MRAA  PCR  1/17 > neg   ANTIBIOTICS:  N/A  LINES/TUBES:     CONSULTANTS:  PCCM   SUBJECTIVE:  Reports continued slow improvement, nearing baseline. States she sat in bedside recline most of the day yesterday and is hopefully to do the same today.   CONSTITUTIONAL: BP (!) 116/95   Pulse (!) 46   Temp (!) 96.9 F (36.1 C) (Axillary)   Resp 18   Ht _0  (1.651 m)   Wt 107.5 kg   SpO2 100%   BMI 39.44 kg/m   I/O last 3 completed shifts: In: 65 [P.O.:960] Out: 3700 [Urine:3700]        PHYSICAL EXAM: General: Chronically ill appearing adult female, in NAD HEENT: [place]/AT, MM pink/moist, PERRL,  Neuro: Alert and oriented x3, non-focal  CV: s1s2 regular rate and rhythm, no murmur, rubs, or gallops,  PULM:  Diminished slightly course breath sounds bilaterally,  currently requiring HFNC and NRB for comfort GI: soft, bowel sounds active in all 4 quadrants, non-tender, non-distended Extremities: warm/dry, no edema  Skin: no rashes or lesions  RESOLVED PROBLEM LIST   ASSESSMENT AND PLAN    Severe Chronic Hypoxemic Respiratory Failure Acute exacerbation of ILD  Fibrotic NSIP Chronic immunosuppression  -Baseline 35m followed by 124mdaily alternating prednisone dosing  -Was receiving rituxan, last dose in June, was  [place] for [place], so she has not received.  -Baseline 15L O2 at home per patient  -RVP negative  -HRCT compatible with chronic fibrotic interstitial lung disease  P: Continue IV steroids with plan to transition back to home regiment  Frequent ambulation encouraged  Again encouraged removal of NRB for return back to home regiment  Continue home Myocophenolate  Bactrim prophylaxis Monday Wednesday and Friday  PT/OT  PAH on tyvaso, WHO Group 3, secondary to lung disease  - [facility]: At baseline Supportive care  DVT R LE 02/03/2019 on eliquis P: PO Eliquis   Diabetes Mellitus II, Hyperglycemia, on steroids  P: SSI and Levemir   SUMMARY OF TODAY'S PLAN:  Continue current regiment with IV steroids and high supplemental oxygen replacement.   LABS  Glucose Recent Labs  Lab 02/26/19 1605 02/26/19 2143 02/27/19 0747 02/27/19 1138 02/27/19 1609 02/27/19 2132  GLUCAP 357* 260* 217* 309* 341* 244*    BMET Recent Labs  Lab 02/22/19 2321 02/28/19 0135  NA 139 142  K 3.8 4.0  CL 97* 91*  CO2 28 39*  BUN 16 31*  CREATININE 0.96 0.94  GLUCOSE 160* 167*    Liver Enzymes Recent Labs  Lab 02/22/19 2321  AST 62*  ALT 32  ALKPHOS 87  BILITOT 0.5  ALBUMIN 3.8    Electrolytes Recent Labs  Lab 02/22/19 2321 02/28/19 0135  CALCIUM 9.2 8.4*  MG 1.9  --     CBC Recent Labs  Lab 02/22/19 2321 02/28/19 0135  WBC 6.8 13.4*  HGB 12.0 12.0  HCT 41.5 42.0  PLT 364 358    ABG Recent Labs  Lab 02/22/19 2252  PHART 7.410  PCO2ART 41.2  PO2ART 92.0    Coag's No results for input(s): APTT, INR in the last 168 hours.  Sepsis Markers No results for input(s): LATICACIDVEN, PROCALCITON, O2SATVEN in the last 168 hours.  Cardiac Enzymes No results for input(s): TROPONINI, PROBNP in the last 168 hours.   WhJohnsie CancelNP-C [facility] Contact / Pager information can be found  on Amion  02/28/2019, 7:52 AM

## 2019-02-28 NOTE — Progress Notes (Signed)
Patient due to receive Rituximab today.  Laural Benes, RN from Inpatient Oncology will administer medication and patient's bedside nurse Tito Dine will assist with titrations and monitoring.  Rituximab dose, dilution, volume to be infused, patient identifiers, and expiration date/time all verified and appropriate.  Zandra Abts [facility]  02/28/2019  11:33 AM

## 2019-03-01 LAB — GLUCOSE, CAPILLARY
Glucose-Capillary: 93 mg/dL (ref 70–99)
Glucose-Capillary: 225 mg/dL — ABNORMAL HIGH (ref 70–99)
Glucose-Capillary: 266 mg/dL — ABNORMAL HIGH (ref 70–99)

## 2019-03-01 MED ORDER — LIVING WELL WITH DIABETES BOOK
Freq: Once | Status: AC
  Filled 2019-03-01: qty 1

## 2019-03-01 NOTE — Progress Notes (Signed)
Inpatient Diabetes Program Recommendations  AACE/ADA: New Consensus Statement on Inpatient Glycemic Control (2015)  Target Ranges:  Prepandial:   less than 140 mg/dL      Peak postprandial:   less than 180 mg/dL (1-2 hours)      Critically ill patients:  140 - 180 mg/dL   Lab Results  Component Value Date   GLUCAP 225 (H) 03/01/2019   HGBA1C 8.6 (H) 02/24/2019    Review of Glycemic Control  Diabetes history: DM2 Outpatient Diabetes medications: metformin 1000 mg QHS Current orders for Inpatient glycemic control: Levemir 12 units bid, Novolog 0-15 units tidwc and 0-5 units QHS + 3 units tidwc  HgbA1C 8.6% - uncontrolled On Solumedrol 40 mg QD Post-prandials continue > 180 mg/dL.  Inpatient Diabetes Program Recommendations:    Increase Novolog to 6 units tidwc for meal coverage insulin.  For discharge:  Levemir 10 units bid Novolog or Humalog 6 units tidwc for meal coverage insulin  Levemir Flexpen Insulin Pen (Order # [id number]) Novolog Flexpen Insulin Pen (Order # [id number]) Insulin Pen Needles (Order # [id number])    Spoke with pt this afternoon regarding her diabetes control at home, HgbA1C of 8.6% and need to go home on insulin. Pt states she is willing to go home on an insulin pen and will f/u with her PCP. Instructed to monitor blood sugars 3-4x/day and take logbook to MD appt for review. Discussed importance of healthy diet and portion control, decreasing simple CHOs, exercising daily for 15-20 minutes to assist with glucose control. Pt has glucose meter at home. Educated on insulin pen use at home.  Reviewed all steps if insulin pen including attachment of needle, 2-unit air shot, dialing up dose, giving injection, removing needle, disposal of sharps, storage of unused insulin, disposal of insulin etc.  Also reviewed troubleshooting with insulin pen. MD to give patient Rxs for insulin pens and insulin pen needles. Sent video of insulin pen use to pt's cell phone.   To f/u with  PCP within 1 week of discharge.  Thank you. Lorenda Peck, RD, LDN, CDE Inpatient Diabetes Coordinator [phone]

## 2019-03-01 NOTE — Progress Notes (Signed)
Physical Therapy Treatment Patient Details Name: Felicia [name] MRN: [id number] DOB: Jan 09, 1971 Today's Date: 03/01/2019    History of Present Illness 49 yo female admitted with acute on chronic respiratory failure, hypoxemia. Hx of CHF, DVT, DM, pulm HTN, nonspecific interstitial pneumonitis    PT Comments    Pt declined mobility however requesting education on improving breathing/posture.  Pt provided with breathing exercises handout and reviewed handout while pt demonstrated each exercise.  Pt reports understanding and thankful for handout.    Follow Up Recommendations  Home health PT     Equipment Recommendations  Rolling walker with 5" wheels    Recommendations for Other Services       Precautions / Restrictions Precautions Precautions: Fall Precaution Comments: on 15L @ baseline.  On HFNC and NRB now.    Mobility  Bed Mobility                  Transfers                    Ambulation/Gait                 Stairs             Wheelchair Mobility    Modified Rankin (Stroke Patients Only)       Balance                                            Cognition Arousal/Alertness: Awake/alert Behavior During Therapy: WFL for tasks assessed/performed Overall Cognitive Status: Within Functional Limits for tasks assessed                                        Exercises Other Exercises Other Exercises: Reviewed breathing handout exercises and pt demonstrated each.  Reviewed repetition and performing 2-3 times/day.  Pt provided with 2 paper copies per request    General Comments        Pertinent Vitals/Pain Pain Assessment: No/denies pain    Home Living                      Prior Function            PT Goals (current goals can now be found in the care plan section) Progress towards PT goals: Progressing toward goals    Frequency    Min 3X/week      PT Plan Current plan  remains appropriate    Co-evaluation              AM-PAC PT "6 Clicks" Mobility   Outcome Measure  Help needed turning from your back to your side while in a flat bed without using bedrails?: A Little Help needed moving from lying on your back to sitting on the side of a flat bed without using bedrails?: A Little Help needed moving to and from a bed to a chair (including a wheelchair)?: A Little Help needed standing up from a chair using your arms (e.g., wheelchair or bedside chair)?: A Little Help needed to walk in hospital room?: A Lot Help needed climbing 3-5 steps with a railing? : A Lot 6 Click Score: 16    End of Session Equipment Utilized During Treatment: Oxygen Activity Tolerance: Patient tolerated treatment well Patient left: with  call bell/phone within reach;in bed   PT Visit Diagnosis: Muscle weakness (generalized) (M62.81)     Time: 2122-4825 PT Time Calculation (min) (ACUTE ONLY): 10 min  Charges:  $Therapeutic Exercise: 8-22 mins                    Arlyce Dice, DPT Acute Rehabilitation Services Office: [phone]  Trena Platt 03/01/2019, 3:34 PM

## 2019-03-01 NOTE — Progress Notes (Signed)
PULMONARY / CRITICAL CARE MEDICINE   NAME:  Felicia [name], MRN:  [id number], DOB:  1970/07/15, LOS: 6 ADMISSION DATE:  02/22/2019, CONSULTATION DATE:  1/16 REFERRING MD:  EDP, CHIEF COMPLAINT:  Light headed, heart pounding   BRIEF HISTORY:    49 yobf with NSIP under care of [place] [facility] with assoc  PAH on tyvaso worse since March 2020 p tapered to pred 20 mg daily and course complicated by R DVT  60/73/71  and refractory "sinus infections" acutely worse Light headed/ sob p working at desk all day 1/15 to ER where did much better on NRM but desats again on NP so PCCM service asked to eval am 1/16   Denied cp/ stated R leg swelling resolved p rx with Eliquis and generalize swelling well controlled with lasix.   HISTORY OF PRESENT ILLNESS   49 y/o female with hx of NSIP (on Cellcept and chronic steroids), pulmonary HTN, dCHF, VTE (on chronic Eliquis), DM presented to the ED for c/o worsening SOB. Patient chronically on 10-15L supplemental O2 at baseline. Usually is able to walk short distances without issue. Notes increased DOE lately with lightheadness following prolonged exertion. Feels a tightness in her central chest with occasional "fluttering" at times when she has worsening dyspnea. Reports experiencing symptoms of a "sinus infection" x 7 months with cough productive of clear phlegm. Last course of abx was 2 weeks pta but no better.   Notes compliance with her daily medications and blood thinners.     SIGNIFICANT PAST MEDICAL HISTORY    Past Medical History:  Diagnosis Date  . Diabetes mellitus without complication ([place])    type 2   . Hypertension   . NSIP (nonspecific interstitial pneumonitis) ([place])   . SVT (supraventricular tachycardia) ([facility])      SIGNIFICANT EVENTS:  Admitted 1/15  STUDIES:   Sinus CT 1/16 > neg  HRCT 1/18 >  1. Spectrum of findings compatible with chronic fibrotic interstitial lung disease with small focus of honeycombing. No clear apicobasilar gradient.  Findings are stable to mildly progressed since 2018 chest CT study, with clear progression since remote 2010 chest CT. Findings are compatible with biopsy-proven fibrotic nonspecific interstitial pneumonia (NSIP). Findings are suggestive of an alternative diagnosis (not UIP) per consensus guidelines: Diagnosis of Idiopathic Pulmonary Fibrosis: An Official ATS/ERS/JRS/ALAT Clinical Practice Guideline. [place], Iss 5, [phone], Oct 08 2016. 2. No acute superimposed pulmonary process. 3. Cardiomegaly. Stable dilated main pulmonary chronic pulmonary artery arterial hypertension., suggesting  CULTURES:  Resp viral panel  1/15 > neg  MRAA  PCR  1/17 > neg   ANTIBIOTICS:  N/A  LINES/TUBES:     CONSULTANTS:  PCCM   SUBJECTIVE:  Reports continued slow improvement, nearing baseline. States she sat in bedside recline most of the day yesterday and is hopefully to do the same today.   CONSTITUTIONAL: BP 124/83   Pulse 77   Temp 98.1 F (36.7 C) (Oral)   Resp (!) 22   Ht _0  (1.651 m)   Wt 106.8 kg   SpO2 95%   BMI 39.18 kg/m   I/O last 3 completed shifts: In: 69 [P.O.:1740] Out: 2900 [Urine:2900]        PHYSICAL EXAM: General: Chronically ill appearing adult female, in NAD HEENT: [place]/AT, MM pink/moist, PERRL,  Neuro: Alert and oriented x3, non-focal  CV: s1s2 regular rate and rhythm, no murmur, rubs, or gallops,  PULM:  Diminished slightly course breath sounds bilaterally, currently requiring  HFNC and NRB for comfort GI: soft, bowel sounds active in all 4 quadrants, non-tender, non-distended Extremities: warm/dry, no edema  Skin: no rashes or lesions  RESOLVED PROBLEM LIST   ASSESSMENT AND PLAN    Severe Chronic Hypoxemic Respiratory Failure Acute exacerbation of ILD  Fibrotic NSIP Chronic immunosuppression  -Baseline 71m followed by 124mdaily alternating prednisone dosing  -Was receiving rituxan, last dose in June, [place]  [place], so she has not received.  -Baseline 15L O2 at home per patient  -RVP negative  -HRCT compatible with chronic fibrotic interstitial lung disease   P: -Continue current dose of steroids. When discharged, will plan for 5039mrednisone with slow taper (decrease 69m73m weeks) until baseline dose  -Continue PT/OT and mobility efforts -Continue to encourage removal of NRB and encourage alternative methods such as fan to achieve the sensation of air movement  -Continue M/W/F bactrim (continue at discharge)  -Continue home cellcept (continue at discharge  -S/p Rituximab 1/21 (next dose to be arranged by [facility] after discharge)   PAH [place], WHO Group 3, secondary to lung disease  - [facility] -Continue home tyvaso (continue at discharge)   DVT R LE 02/03/2019 on eliquis P: -Eliquis (continue at discharge)   Diabetes Mellitus II, Hyperglycemia, on steroids  -home metformin  P: -SSI and Levemir  -Dm coordinator is following for acute recs. I will ask for recs as we near discharge as well   HTN P: -Continue verapamil, lasix, atenolol (continue at discharge)   SUMMARY OF TODAY'S PLAN:  Continue current regimen and dose of steroids  Continue efforts at weaning off NRB  LABS  Glucose Recent Labs  Lab 02/27/19 1609 02/27/19 2132 02/28/19 0819 02/28/19 1232 02/28/19 1726 02/28/19 2111  GLUCAP 341* 244* 125* 263* 158* 238*    BMET Recent Labs  Lab 02/22/19 2321 02/28/19 0135  NA 139 142  K 3.8 4.0  CL 97* 91*  CO2 28 39*  BUN 16 31*  CREATININE 0.96 0.94  GLUCOSE 160* 167*    Liver Enzymes Recent Labs  Lab 02/22/19 2321  AST 62*  ALT 32  ALKPHOS 87  BILITOT 0.5  ALBUMIN 3.8    Electrolytes Recent Labs  Lab 02/22/19 2321 02/28/19 0135  CALCIUM 9.2 8.4*  MG 1.9  --     CBC Recent Labs  Lab 02/22/19 2321 02/28/19 0135  WBC 6.8 13.4*  HGB 12.0 12.0  HCT 41.5 42.0  PLT 364  358    ABG Recent Labs  Lab 02/22/19 2252  PHART 7.410  PCO2ART 41.2  PO2ART 92.0    Coag's No results for input(s): APTT, INR in the last 168 hours.  Sepsis Markers Recent Labs  Lab 02/28/19 1026  PROCALCITON <0.10    Cardiac Enzymes No results for input(s): TROPONINI, PROBNP in the last 168 hours.    GracEliseo Gum, AGACNP-BC [phone] answer, [id number]/2021, 10:16 AM

## 2019-03-02 LAB — CBC
HCT: 43.7 % (ref 36.0–46.0)
Hemoglobin: 12.6 g/dL (ref 12.0–15.0)
MCH: 24 pg — ABNORMAL LOW (ref 26.0–34.0)
MCHC: 28.8 g/dL — ABNORMAL LOW (ref 30.0–36.0)
MCV: 83.4 fL (ref 80.0–100.0)
Platelets: 333 10*3/uL (ref 150–400)
RBC: 5.24 MIL/uL — ABNORMAL HIGH (ref 3.87–5.11)
RDW: 16.3 % — ABNORMAL HIGH (ref 11.5–15.5)
WBC: 14 10*3/uL — ABNORMAL HIGH (ref 4.0–10.5)
nRBC: 0 % (ref 0.0–0.2)

## 2019-03-02 LAB — BASIC METABOLIC PANEL
Anion gap: 9 (ref 5–15)
BUN: 27 mg/dL — ABNORMAL HIGH (ref 6–20)
CO2: 42 mmol/L — ABNORMAL HIGH (ref 22–32)
Calcium: 8.4 mg/dL — ABNORMAL LOW (ref 8.9–10.3)
Chloride: 88 mmol/L — ABNORMAL LOW (ref 98–111)
Creatinine, Ser: 0.87 mg/dL (ref 0.44–1.00)
GFR calc Af Amer: >60 mL/min (ref >60)
GFR calc non Af Amer: >60 mL/min (ref >60)
Glucose, Bld: 146 mg/dL — ABNORMAL HIGH (ref 70–99)
Potassium: 3.5 mmol/L (ref 3.5–5.1)
Sodium: 139 mmol/L (ref 135–145)

## 2019-03-02 LAB — GLUCOSE, CAPILLARY
Glucose-Capillary: 304 mg/dL — ABNORMAL HIGH (ref 70–99)
Glucose-Capillary: 115 mg/dL — ABNORMAL HIGH (ref 70–99)
Glucose-Capillary: 290 mg/dL — ABNORMAL HIGH (ref 70–99)
Glucose-Capillary: 346 mg/dL — ABNORMAL HIGH (ref 70–99)

## 2019-03-02 MED ORDER — SALINE SPRAY 0.65 % NA SOLN
1.0000 | NASAL | Status: DC | PRN
  Filled 2019-03-02: qty 44

## 2019-03-02 NOTE — Progress Notes (Signed)
PULMONARY / CRITICAL CARE MEDICINE   NAME:  Felicia [name], MRN:  [id number], DOB:  [birth year], LOS: 7 ADMISSION DATE:  02/22/2019, CONSULTATION DATE:  1/16 REFERRING MD:  EDP, CHIEF COMPLAINT:  Light headed, heart pounding   BRIEF HISTORY:    57 yobf with NSIP under care of [place] [facility] with assoc  PAH on tyvaso worse since March 2020 p tapered to pred 20 mg daily and course complicated by R DVT  85/63/14  and refractory "sinus infections" acutely worse Light headed/ sob p working at desk all day 1/15 to ER where did much better on NRM but desats again on NP so PCCM service asked to eval am 1/16   Denied cp/ stated R leg swelling resolved p rx with Eliquis and generalize swelling well controlled with lasix.   HISTORY OF PRESENT ILLNESS   49 y/o female with hx of NSIP (on Cellcept and chronic steroids), pulmonary HTN, dCHF, VTE (on chronic Eliquis), DM presented to the ED for c/o worsening SOB. Patient chronically on 10-15L supplemental O2 at baseline. Usually is able to walk short distances without issue. Notes increased DOE lately with lightheadness following prolonged exertion. Feels a tightness in her central chest with occasional "fluttering" at times when she has worsening dyspnea. Reports experiencing symptoms of a "sinus infection" x 7 months with cough productive of clear phlegm. Last course of abx was 2 weeks pta but no better.   Notes compliance with her daily medications and blood thinners.     SIGNIFICANT PAST MEDICAL HISTORY    Past Medical History:  Diagnosis Date  . Diabetes mellitus without complication ([place])    type 2   . Hypertension   . NSIP (nonspecific interstitial pneumonitis) ([place])   . SVT (supraventricular tachycardia) ([place])      SIGNIFICANT EVENTS:  1/15 Admitted, started IV solumedrol 80 q12 1/19- Reduced solumedrol to 40 q12, HFNC at 15 lpm 1/21- 1 gm IV rituxan, restarted home tyvaso regimen  STUDIES:   Sinus CT 1/16 > neg  HRCT 1/18 >  1. Spectrum of  findings compatible with chronic fibrotic interstitial lung disease with small focus of honeycombing. No clear apicobasilar gradient. Findings are stable to mildly progressed since 2018 chest CT study, with clear progression since remote 2010 chest CT. Findings are compatible with biopsy-proven fibrotic nonspecific interstitial pneumonia (NSIP). Findings are suggestive of an alternative diagnosis (not UIP) per consensus guidelines: Diagnosis of Idiopathic Pulmonary Fibrosis: An Official ATS/ERS/JRS/ALAT Clinical Practice Guideline. [place], Iss 5, [phone], Oct 08 2016. 2. No acute superimposed pulmonary process. 3. Cardiomegaly. Stable dilated main pulmonary chronic pulmonary artery arterial hypertension., suggesting  CULTURES:  Resp viral panel  1/15 > neg  MRAA  PCR  1/17 > neg   ANTIBIOTICS:  N/A  LINES/TUBES:     CONSULTANTS:  PCCM   SUBJECTIVE:  No acute events overnight.  Continues on high flow nasal cannula. States that dyspnea is stable.  CONSTITUTIONAL: BP 140/75   Pulse (!) 54   Temp 97.6 F (36.4 C) (Oral)   Resp 17   Ht _0  (1.651 m)   Wt 105.6 kg   SpO2 98%   BMI 38.74 kg/m   I/O last 3 completed shifts: In: 49 [P.O.:1220] Out: 2550 [Urine:2550]        PHYSICAL EXAM: Gen:      No acute distress HEENT:  EOMI, sclera anicteric Neck:     No masses; no thyromegaly Lungs:    Scattered bilateral  crackles CV:         Regular rate and rhythm; no murmurs Abd:      + bowel sounds; soft, non-tender; no palpable masses, no distension Ext:    No edema; adequate peripheral perfusion Skin:      Warm and dry; no rash Neuro: alert and oriented x 3 Psych: normal mood and affect   RESOLVED PROBLEM LIST   ASSESSMENT AND PLAN    Severe Chronic Hypoxemic Respiratory Failure Acute exacerbation of ILD  Fibrotic NSIP Chronic immunosuppression  -Baseline 68m followed by 168mdaily alternating prednisone dosing  -Was receiving  rituxan, last dose in June, [place], so she has not received.  -Baseline 15L O2 at home per patient  -RVP negative  -HRCT compatible with chronic fibrotic interstitial lung disease   P: -Continue current dose of steroids. When discharged, will plan for 5074mrednisone with slow taper (decrease 64m48m weeks) until baseline dose  -Continue PT/OT and mobility efforts -Continue to encourage removal of NRB and encourage alternative methods such as fan to achieve the sensation of air movement  -Continue M/W/F bactrim (continue at discharge)  -Continue home cellcept (continue at discharge  -S/p [facility] 1/21 [facility]   PAH on tyvaso, WHO Group 3, secondary to lung disease  - [facility] -Continue home tyvaso (continue at discharge)   DVT R LE 02/03/2019 on eliquis P: -Eliquis (continue at discharge)   Diabetes Mellitus II, Hyperglycemia, on steroids  -home metformin  P: -SSI and Levemir  -Dm coordinator is following for acute recs.  HTN P: -Continue verapamil, lasix, atenolol (continue at discharge)   SUMMARY OF TODAY'S PLAN:  Continue current regimen and dose of steroids  Continue efforts at weaning off NRB  LABS  Glucose Recent Labs  Lab 02/28/19 2111 03/01/19 0838 03/01/19 1228 03/01/19 1702 03/01/19 2045 03/02/19 0747  GLUCAP 238* 93 225* 266* 304* 115*    BMET Recent Labs  Lab 02/28/19 0135 03/02/19 0522  NA 142 139  K 4.0 3.5  CL 91* 88*  CO2 39* 42*  BUN 31* 27*  CREATININE 0.94 0.87  GLUCOSE 167* 146*    Liver Enzymes No results for input(s): AST, ALT, ALKPHOS, BILITOT, ALBUMIN in the last 168 hours.  Electrolytes Recent Labs  Lab 02/28/19 0135 03/02/19 0522  CALCIUM 8.4* 8.4*    CBC Recent Labs  Lab 02/28/19 0135 03/02/19 0522  WBC 13.4* 14.0*  HGB 12.0 12.6  HCT 42.0 43.7  PLT 358 333    ABG No results for input(s): PHART, PCO2ART, PO2ART  in the last 168 hours.  Coag's No results for input(s): APTT, INR in the last 168 hours.  Sepsis Markers Recent Labs  Lab 02/28/19 1026  PROCALCITON <0.10    Cardiac Enzymes No results for input(s): TROPONINI, PROBNP in the last 168 hours.    [facility] Please see [email or web address] for pager details.  03/02/2019, 8:35 AM

## 2019-03-02 NOTE — Progress Notes (Signed)
Added Sterile Water to [place] HFNC system. PT states her daughter should be here today by noon to deliver Tyvaso home medication.

## 2019-03-02 NOTE — Progress Notes (Signed)
Spoke with PT at [phone] concerning Tyvaso treatment- PT states her daughter will need to bring more medication for PT to administer.

## 2019-03-03 LAB — MAGNESIUM: Magnesium: 2.3 mg/dL (ref 1.7–2.4)

## 2019-03-03 LAB — PHOSPHORUS: Phosphorus: 3.9 mg/dL (ref 2.5–4.6)

## 2019-03-03 LAB — BASIC METABOLIC PANEL
Anion gap: 10 (ref 5–15)
BUN: 27 mg/dL — ABNORMAL HIGH (ref 6–20)
CO2: 42 mmol/L — ABNORMAL HIGH (ref 22–32)
Calcium: 8.6 mg/dL — ABNORMAL LOW (ref 8.9–10.3)
Chloride: 88 mmol/L — ABNORMAL LOW (ref 98–111)
Creatinine, Ser: 0.92 mg/dL (ref 0.44–1.00)
GFR calc Af Amer: >60 mL/min (ref >60)
GFR calc non Af Amer: >60 mL/min (ref >60)
Glucose, Bld: 132 mg/dL — ABNORMAL HIGH (ref 70–99)
Potassium: 4.3 mmol/L (ref 3.5–5.1)
Sodium: 140 mmol/L (ref 135–145)

## 2019-03-03 LAB — GLUCOSE, CAPILLARY
Glucose-Capillary: 191 mg/dL — ABNORMAL HIGH (ref 70–99)
Glucose-Capillary: 94 mg/dL (ref 70–99)
Glucose-Capillary: 129 mg/dL — ABNORMAL HIGH (ref 70–99)

## 2019-03-03 LAB — CBC
HCT: 45.1 % (ref 36.0–46.0)
Hemoglobin: 12.7 g/dL (ref 12.0–15.0)
MCH: 23.6 pg — ABNORMAL LOW (ref 26.0–34.0)
MCHC: 28.2 g/dL — ABNORMAL LOW (ref 30.0–36.0)
MCV: 83.7 fL (ref 80.0–100.0)
Platelets: 330 10*3/uL (ref 150–400)
RBC: 5.39 MIL/uL — ABNORMAL HIGH (ref 3.87–5.11)
RDW: 16.3 % — ABNORMAL HIGH (ref 11.5–15.5)
WBC: 14.6 10*3/uL — ABNORMAL HIGH (ref 4.0–10.5)
nRBC: 0 % (ref 0.0–0.2)

## 2019-03-03 MED ORDER — PREDNISONE 10 MG PO TABS: 50.0000 mg | ORAL_TABLET | Freq: Every day | ORAL | 0 refills | Status: DC

## 2019-03-03 MED ORDER — INSULIN DETEMIR 100 UNIT/ML ~~LOC~~ SOLN: 10.0000 [IU] | Freq: Two times a day (BID) | SUBCUTANEOUS | 11 refills | Status: DC

## 2019-03-03 MED ORDER — INSULIN ASPART 100 UNIT/ML ~~LOC~~ SOLN: 6.0000 [IU] | Freq: Three times a day (TID) | SUBCUTANEOUS | 3 refills | Status: DC

## 2019-03-03 MED ORDER — TYVASO REFILL KIT 0.6 MG/ML IN SOLN: 18.0000 ug | Freq: Four times a day (QID) | RESPIRATORY_TRACT | 0 refills | Status: AC

## 2019-03-03 NOTE — Discharge Summary (Signed)
Physician Discharge Summary  Patient ID: Felicia [name] MRN: [id number] DOB/AGE: 08-23-1970 49 y.o.  Admit date: 02/22/2019 Discharge date: 03/03/2019  Problem List Active Problems:   NSIP (nonspecific interstitial pneumonitis) (HCC)   Acute on chronic respiratory failure with hypoxemia (HCC)   Pulmonary HTN (HCC)   Acute respiratory failure with hypoxemia (HCC)  HPI: 49 y/o female with hx of NSIP (on Cellcept and chronic steroids), pulmonary HTN, dCHF, VTE (on chronic Eliquis), DM presented to the ED for c/o worsening SOB. Patient chronically on 10-15L supplemental O2 at baseline. Usually is able to walk short distances without issue. Notes increased DOE lately with lightheadness following prolonged exertion. Feels a tightness in her central chest with occasional "fluttering" at times when she has worsening dyspnea. Reports experiencing symptoms of a "sinus infection" x 7 months with cough productive of clear phlegm. Last course of abx was 2 weeks pta but no better.   Notes compliance with her daily medications and blood thinners.   No obvious day to day or daytime variability or assoc excess/ purulent sputum or mucus plugs or hemoptysis or cp   subjective wheeze or overt sinus or hb symptoms.   Sleeping flat without nocturnal  or early am exacerbation  of respiratory  c/o's or need for noct saba. Also denies any obvious fluctuation of symptoms with weather or environmental changes or other aggravating or alleviating factors except as outlined above." - H&P 02/23/19  Hospital Course: 02/23/19> admitted and started on IV steroids, negative pct so antibiotics not given 02/25/19> HRCT c/w chronic fibrotic ILD 02/26/19 steroids tapered 02/28/19 IV rituximab given and tyvaso resumed 03/01/19 Oxygen support weaned, patient with some anxiety regarding decreased use of NRB. Anxiety explored 03/02/19 continued efforts with mobility and decreased usage of NRB 03/03/19 patient is stable for discharge  on baseline home oxygen requirement. Follow up with [facility] has been arranged and follow up with [facility] has been initiated.   PHYSICAL EXAM:  Gen:       No acute distress HEENT:  EOMI, sclera anicteric Neck:     No masses; no thyromegaly Lungs:    Scattered bilateral crackes CV:         Regular rate and rhythm; no murmurs Abd:        + bowel sounds; soft, non-tender; no palpable masses, no distension Ext:         No edema; adequate peripheral perfusion Skin:      Warm and dry; no rash Neuro: alert and oriented x 3 Psych: normal mood and affect  Plan for Discharge:  Severe Chronic Hypoxemic Respiratory Failure Acute exacerbation of ILD  Fibrotic NSIP Chronic immunosuppression  -Baseline prednisone 50m followed by 135mdaily alternating prednisone dosing  -Was receiving rituxan, last dose in June, [place] for fall 2020, so she has not received.  -Baseline 15L O2 at home per patient  -RVP negative  -HRCT compatible with chronic fibrotic interstitial lung disease   P: -Continue steroids. After discharge, will plan for 5026mrednisone with slow taper (decrease 14m37m weeks) until baseline dose  -Continue M/W/F bactrim (continue at discharge)  -Continue home cellcept (continue at discharge  -She had received 1 dose Rituxan 1 gm during this admission. [facility] -Follow up appointment with [facility] next week arranged by Dr. Mann[place]aughan [place], secondary to lung disease  - [facility]  P: -Continue home tyvaso (continue at discharge)   DVT R LE 02/03/2019 on eliquis P: -Eliquis (continue at discharge)   HTN P: -Continue verapamil, lasix, atenolol (continue at discharge)  Diabetes Mellitus II, Hyperglycemia, on steroids  -home metformin  P: -Diabetes Coordinator has provided discharge  recommendations of Levemir 10 units bid Novolog or Humalog  6 units tidwc for meal coverage insulin  Labs at discharge Lab Results  Component Value Date   CREATININE 0.92 03/03/2019   BUN 27 (H) 03/03/2019   NA 140 03/03/2019   K 4.3 03/03/2019   CL 88 (L) 03/03/2019   CO2 42 (H) 03/03/2019   Lab Results  Component Value Date   WBC 14.6 (H) 03/03/2019   HGB 12.7 03/03/2019   HCT 45.1 03/03/2019   MCV 83.7 03/03/2019   PLT 330 03/03/2019   Lab Results  Component Value Date   ALT 32 02/22/2019   AST 62 (H) 02/22/2019   ALKPHOS 87 02/22/2019   BILITOT 0.5 02/22/2019   Lab Results  Component Value Date   INR 0.97 12/24/2008   INR 1.1 ratio (H) 09/12/2008    Current radiology studies No results found.  Disposition: Discharge home      Allergies as of 03/03/2019   No Known Allergies     Medication List    STOP taking these medications   HYDROcodone-homatropine 5-1.5 MG/5ML syrup Commonly known as: HYCODAN   metFORMIN 500 MG 24 hr tablet Commonly known as: GLUCOPHAGE-XR     TAKE these medications   acetaminophen 325 MG tablet Commonly known as: TYLENOL Take 650 mg by mouth every 6 (six) hours as needed for moderate pain.   albuterol 108 (90 Base) MCG/ACT inhaler Commonly known as: VENTOLIN HFA Inhale 1 puff into the lungs every 6 (six) hours as needed for wheezing or shortness of breath.   apixaban 5 MG Tabs tablet Commonly known as: Eliquis Take 2 tablets (51m) twice daily for 7 days, then 1 tablet (528m twice daily What changed:   how much to take  how to take this  when to take this  additional instructions   atenolol 50 MG tablet Commonly known as: TENORMIN Take 75 mg by mouth 2 (two) times daily.   cholecalciferol 25 MCG (1000 UNIT) tablet Commonly known as: VITAMIN D3 Take 1,000 Units by mouth daily.   furosemide 40 MG tablet Commonly known as: LASIX Take 1 tablet (40 mg total) by mouth 2 (two) times daily. What changed: when to take this   insulin aspart 100 UNIT/ML injection Commonly  known as: NovoLOG Inject 6 Units into the skin 3 (three) times daily with meals.   insulin detemir 100 UNIT/ML injection Commonly known as: Levemir Inject 0.1 mLs (10 Units total) into the skin 2 (two) times daily.   ipratropium-albuterol 0.5-2.5 (3) MG/3ML Soln Commonly known as: DUONEB Inhale 3 mLs into the lungs every 6 (six) hours as needed (sob and wheezing).   Mucinex 600 MG 12 hr tablet Generic drug: guaiFENesin Take 600 mg by mouth daily.   mycophenolate 500 MG tablet Commonly known as: CELLCEPT Take 1,500 mg by mouth 2 (two) times daily.   predniSONE 10 MG tablet Commonly known as: DELTASONE Take 5 tablets (50 mg total) by mouth daily with breakfast.   sulfamethoxazole-trimethoprim 800-160 MG tablet Commonly known as: BACTRIM DS Take 1 tablet by mouth 3 (three) times a week.   Tyvaso Refill 0.6 MG/ML Soln Generic drug: Treprostinil Inhale 18 mcg into the lungs every 6 (six)  hours. What changed: how much to take   verapamil 120 MG CR tablet Commonly known as: CALAN-SR Take 1 tablet (120 mg total) by mouth at bedtime.      Follow-up Information    Schedule an appointment as soon as possible for a visit  with Rankins, Bill Salinas, MD.   Specialty: Family Medicine Why: For re-check Contact information: [place] [address] [phone]        Schedule an appointment as soon as possible for a visit  with Laurin Coder, MD.   Specialty: Pulmonary Disease Contact information: [address] [place] [address] [phone]        [facility] DEPT.   Specialty: Emergency Medicine Why: As needed, if symptoms worsen Contact information: [place] [id number] [place] [id number]           Discharged Condition: stable  Time spent on discharge greater than 40 minutes.  Vital signs at Discharge. Temp:  [96.5 F (35.8 C)-97.7 F (36.5 C)]  96.8 F (36 C) (01/24 0826) Pulse Rate:  [48-94] 94 (01/24 0838) Resp:  [13-32] 21 (01/24 0715) BP: (98-151)/(47-107) 118/78 (01/24 0838) SpO2:  [92 %-100 %] 95 % (01/24 0715) Weight:  [105.9 kg] 105.9 kg (01/24 0436)    Signed: Marshell Garfinkel MD [facility] Please see [email or web address] for pager details.  03/03/2019, 10:54 AM

## 2019-03-03 NOTE — Progress Notes (Signed)
PULMONARY / CRITICAL CARE MEDICINE   NAME:  Felicia [name], MRN:  [id number], DOB:  03/19/1970, LOS: 8 ADMISSION DATE:  02/22/2019, CONSULTATION DATE:  1/16 REFERRING MD:  EDP, CHIEF COMPLAINT:  Light headed, heart pounding   BRIEF HISTORY:    43 yobf with NSIP under care of [facility] with assoc  PAH on tyvaso worse since March 2020 p tapered to pred 20 mg daily and course complicated by R DVT  65/46/50  and refractory "sinus infections" acutely worse Light headed/ sob p working at desk all day 1/15 to ER where did much better on NRM but desats again on NP so PCCM service asked to eval am 1/16   Denied cp/ stated R leg swelling resolved p rx with Eliquis and generalize swelling well controlled with lasix.   HISTORY OF PRESENT ILLNESS   49 y/o female with hx of NSIP (on Cellcept and chronic steroids), pulmonary HTN, dCHF, VTE (on chronic Eliquis), DM presented to the ED for c/o worsening SOB. Patient chronically on 10-15L supplemental O2 at baseline. Usually is able to walk short distances without issue. Notes increased DOE lately with lightheadness following prolonged exertion. Feels a tightness in her central chest with occasional "fluttering" at times when she has worsening dyspnea. Reports experiencing symptoms of a "sinus infection" x 7 months with cough productive of clear phlegm. Last course of abx was 2 weeks pta but no better.   Notes compliance with her daily medications and blood thinners.     SIGNIFICANT PAST MEDICAL HISTORY    Past Medical History:  Diagnosis Date  . Diabetes mellitus without complication ([place])    type 2   . Hypertension   . NSIP (nonspecific interstitial pneumonitis) ([place])   . SVT (supraventricular tachycardia) ([place])      SIGNIFICANT EVENTS:  1/15 Admitted, started IV solumedrol 80 q12 1/19- Reduced solumedrol to 40 q12, HFNC at 15 lpm 1/21- 1 gm IV rituxan, restarted home tyvaso regimen  STUDIES:   Sinus CT 1/16 > neg  HRCT 1/18 >  1. Spectrum of  findings compatible with chronic fibrotic interstitial lung disease with small focus of honeycombing. No clear apicobasilar gradient. Findings are stable to mildly progressed since 2018 chest CT study, with clear progression since remote 2010 chest CT. Findings are compatible with biopsy-proven fibrotic nonspecific interstitial pneumonia (NSIP). Findings are suggestive of an alternative diagnosis (not UIP) per consensus guidelines: Diagnosis of Idiopathic Pulmonary Fibrosis: An Official ATS/ERS/JRS/ALAT Clinical Practice Guideline. [place], Iss 5, [phone], Oct 08 2016. 2. No acute superimposed pulmonary process. 3. Cardiomegaly. Stable dilated main pulmonary chronic pulmonary artery arterial hypertension., suggesting  CULTURES:  Resp viral panel  1/15 > neg  MRAA  PCR  1/17 > neg   ANTIBIOTICS:  N/A  LINES/TUBES:     CONSULTANTS:  PCCM   SUBJECTIVE:  States that her breathing has continued to improve.  She feels at baseline Off nonrebreather and on her home regimen of high flow nasal cannula  CONSTITUTIONAL: BP 139/88 (BP Location: Left Arm)   Pulse 82   Temp (!) 96.8 F (36 C) (Axillary)   Resp 13   Ht _0  (1.651 m)   Wt 105.9 kg   SpO2 98%   BMI 38.85 kg/m   I/O last 3 completed shifts: In: 340 [P.O.:340] Out: 2750 [Urine:2750]        PHYSICAL EXAM: Gen:      No acute distress HEENT:  EOMI, sclera anicteric Neck:  No masses; no thyromegaly Lungs:    Clear to auscultation bilaterally; normal respiratory effort CV:         Regular rate and rhythm; no murmurs Abd:      + bowel sounds; soft, non-tender; no palpable masses, no distension Ext:    No edema; adequate peripheral perfusion Skin:      Warm and dry; no rash Neuro: alert and oriented x 3 Psych: normal mood and affect   RESOLVED PROBLEM LIST   ASSESSMENT AND PLAN    Severe Chronic Hypoxemic Respiratory Failure Acute exacerbation of ILD  Fibrotic NSIP Chronic  immunosuppression  -Baseline 109m followed by 123mdaily alternating prednisone dosing  -Was receiving rituxan, last dose in June, [place] dose for fall 2020, so she has not received.  -Baseline 15L O2 at home per patient  -RVP negative  -HRCT compatible with chronic fibrotic interstitial lung disease   P: - Discharge today -Continue current dose of steroids. Will plan for 5081mrednisone with slow taper (decrease 24m62m weeks) until baseline dose  -Continue PT/OT and mobility efforts -Continue M/W/F bactrim (continue at discharge)  -Continue home cellcept (continue at discharge  -S/p [facility]21 [facility], WHO Group 3, secondary to lung disease  - [facility] -Continue home tyvaso (continue at discharge)   DVT R LE 02/03/2019 on eliquis P: -Eliquis (continue at discharge)   Diabetes Mellitus II, Hyperglycemia, on steroids  -home metformin  P: -SSI and Levemir   HTN P: -Continue verapamil, lasix, atenolol (continue at discharge)   SUMMARY OF TODAY'S PLAN:  Continue current regimen and dose of steroids  DC Follow up in pulmonary clinic and [facility]  LABS  Glucose Recent Labs  Lab 03/01/19 1702 03/01/19 2045 03/02/19 0747 03/02/19 1604 03/02/19 1954 03/03/19 0750  GLUCAP 266* 304* 115* 290* 346* 94    BMET Recent Labs  Lab 02/28/19 0135 03/02/19 0522 03/03/19 0224  NA 142 139 140  K 4.0 3.5 4.3  CL 91* 88* 88*  CO2 39* 42* 42*  BUN 31* 27* 27*  CREATININE 0.94 0.87 0.92  GLUCOSE 167* 146* 132*    Liver Enzymes No results for input(s): AST, ALT, ALKPHOS, BILITOT, ALBUMIN in the last 168 hours.  Electrolytes Recent Labs  Lab 02/28/19 0135 03/02/19 0522 03/03/19 0224  CALCIUM 8.4* 8.4* 8.6*  MG  --   --  2.3  PHOS  --   --  3.9    CBC Recent Labs  Lab 02/28/19 0135 03/02/19 0522 03/03/19 0224  WBC 13.4* 14.0* 14.6*  HGB 12.0 12.6 12.7   HCT 42.0 43.7 45.1  PLT 358 333 330    ABG No results for input(s): PHART, PCO2ART, PO2ART in the last 168 hours.  Coag's No results for input(s): APTT, INR in the last 168 hours.  Sepsis Markers Recent Labs  Lab 02/28/19 1026  PROCALCITON <0.10    Cardiac Enzymes No results for input(s): TROPONINI, PROBNP in the last 168 hours.    [facility] Please see [email or web address] for pager details.  03/03/2019, 8:40 AM

## 2019-03-03 NOTE — Progress Notes (Signed)
Reviewed AVS and discharge plan with patient. Her daughter is en route with oxygen to take her back home. NT Diane helped patient to get bathed and dressed. Daughter to text patient when she arrives for staff to meet at hospital entrance.

## 2019-03-04 ENCOUNTER — Ambulatory Visit (INDEPENDENT_AMBULATORY_CARE_PROVIDER_SITE_OTHER): Payer: 59 | Admitting: Pulmonary Disease

## 2019-03-04 ENCOUNTER — Other Ambulatory Visit: Payer: Self-pay

## 2019-03-04 DIAGNOSIS — I272 Pulmonary hypertension, unspecified: Secondary | ICD-10-CM | POA: Diagnosis not present

## 2019-03-04 DIAGNOSIS — J9601 Acute respiratory failure with hypoxia: Secondary | ICD-10-CM | POA: Diagnosis not present

## 2019-03-04 DIAGNOSIS — R531 Weakness: Secondary | ICD-10-CM

## 2019-03-05 NOTE — Progress Notes (Signed)
Virtual Visit via Telephone Note  I connected with Felicia [name] on 03/05/19 at  9:45 AM EST by telephone and verified that I am speaking with the correct person using two identifiers.  Location: Patient: Felicia [name] Provider: Adrian Prince discussed the limitations, risks, security and privacy concerns of performing an evaluation and management service by telephone and the availability of in person appointments. I also discussed with the patient that there may be a patient responsible charge related to this service. The patient expressed understanding and agreed to proceed.   History of Present Illness: Patient with NSIP Recently hospitalized for worsening shortness of breath, chest tightness She was treated with a course of steroids, rituximab Hospitalized from 1/16-1/24 Follows up at [facility] for NSIP with Dr. Aline [name] Discussion today centered around pulmonary rehab, initiation of immunologic-whether she can receive immunologic locally Communication with Dr. Aline [name] at [facility] about ongoing care and to see whether her medications can be received locally  She is generally feeling better but still very tired   Observations/Objective: Sounds well on the phone  Assessment and Plan: Severe chronic hypoxemic respiratory failure on high flow oxygen -Continue oxygen supplementation -On slow tapering dose of steroids  Fibrotic NSIP -On Rituxan -On CellCept  Pulmonary arterial hypertension- -on Tyvaso  DVT -On Eliquis  Hypertension Diabetes  Follow Up Instructions: We will try and arrange pulmonary rehab Will make contact with Dr. Aline [name] at [facility] regarding noninvasive ventilation -I did review arterial blood gases-not showing significant hypercapnia -We will likely not be able to have a sleep study performed  Will follow   I discussed the assessment and treatment plan with the patient. The patient was provided an opportunity to ask  questions and all were answered. The patient agreed with the plan and demonstrated an understanding of the instructions.   The patient was advised to call back or seek an in-person evaluation if the symptoms worsen or if the condition fails to improve as anticipated.  I provided 20 minutes of non-face-to-face time during this encounter.   Laurin Coder, MD

## 2019-03-05 NOTE — Patient Instructions (Signed)
Referral to pulmonary rehab  Contact Dr. Aline Brochure  Find out about noninvasive ventilation

## 2019-03-12 ENCOUNTER — Other Ambulatory Visit: Payer: Self-pay | Admitting: Pulmonary Disease

## 2019-03-12 ENCOUNTER — Telehealth: Payer: Self-pay | Admitting: Pulmonary Disease

## 2019-03-12 MED ORDER — APIXABAN 5 MG PO TABS: 5.0000 mg | ORAL_TABLET | Freq: Two times a day (BID) | ORAL | 3 refills | Status: DC

## 2019-03-12 NOTE — Telephone Encounter (Signed)
Dr. [name], is pt going to continue on the Eliquis? She states she had a telephone visit with you but didn't refill the Eliquis. She only has the Rx that the hospital gave her and she is about to run out. Please advise.    Assessment and Plan: Severe chronic hypoxemic respiratory failure on high flow oxygen -Continue oxygen supplementation -On slow tapering dose of steroids  Fibrotic NSIP -On Rituxan -On CellCept  Pulmonary arterial hypertension- -on Tyvaso  DVT -On Eliquis  Hypertension Diabetes

## 2019-03-12 NOTE — Progress Notes (Unsigned)
eliquis

## 2019-03-12 NOTE — Telephone Encounter (Signed)
Prescription for Eliquis sent in

## 2019-03-13 NOTE — Telephone Encounter (Signed)
Patient is returning your phone call.  Patient phone number is [phone].

## 2019-03-13 NOTE — Telephone Encounter (Signed)
PCC's can you please advise on this pt email:   *-*-*This message has not been handled.*-*-*  Dr. [name], Hello and Good Morning. I wanted to follow up on the request that I had to receive physical therapy as a part of Home Health due to the risk of COVID exposure and to aid in recovering some of my mobility and strength. I wanted to know if there was a status on this item.   Any updates or information would be greatly appreciated.   Thanks, Felicia [name]

## 2019-03-13 NOTE — Telephone Encounter (Signed)
lmtcb for pt.  

## 2019-03-13 NOTE — Telephone Encounter (Signed)
Called the patient back and advised prescription was sent to the pharmacy. Patient voiced understanding. Nothing further needed at this time.

## 2019-03-18 ENCOUNTER — Telehealth: Payer: Self-pay | Admitting: Pulmonary Disease

## 2019-03-18 NOTE — Telephone Encounter (Signed)
Spoke with the pt  She states she wants to receive her Rituxan infusions here through [facility] and wants [place] to set this up  She is currently getting this done through [facility], but wants to go through [facility] bc it's local  Please advise thanks

## 2019-03-18 NOTE — Telephone Encounter (Signed)
LMTCB x1 for pt. If medication is ordered by Rob Hickman, [facility] would need to the ones to get this setup with [facility].

## 2019-03-18 NOTE — Telephone Encounter (Signed)
Can we ask pharmacist how this can be set up  It is okay with me

## 2019-03-18 NOTE — Telephone Encounter (Signed)
Spoke with pt. She is aware that [facility] will need to contact [facility] to set up her infusions. Pt states that her doctor at [facility] orders this for her. I have given her the number for [facility] to contact Short Stay. Nothing further was needed.

## 2019-03-18 NOTE — Telephone Encounter (Signed)
Pt is returning the call.

## 2019-03-19 NOTE — Telephone Encounter (Signed)
Santiago Glad from [facility] calling back She stated they would not be able to set the pt up wit PT. Santiago Glad can be reached back at [phone].

## 2019-03-21 ENCOUNTER — Other Ambulatory Visit: Payer: Self-pay | Admitting: Pulmonary Disease

## 2019-03-21 MED ORDER — PREDNISONE 10 MG PO TABS: 40.0000 mg | ORAL_TABLET | Freq: Every day | ORAL | 2 refills | Status: AC

## 2019-03-21 NOTE — Telephone Encounter (Signed)
Dr Jenetta Downer- Please advise on pt email and note that there is no f/u with you pending at this time, thanks!  Dr. [name], [place]. Hope all is well. While in the Hospital, Dr. Kimber Relic prescribed 50 milligrams of Prednisone a day. I am set to taper off this dose in the near future. I am wondering what is the next dose that I will taper down to, since it is not indicated in the notes. In addition, I will need a prescription for that dosage as well.   Also if there is an overall taper dosage plan that I can be provided with.   Any additional insight or guidance would be greatly appreciated.   Thanks in advance,

## 2019-03-21 NOTE — Telephone Encounter (Signed)
Per Earnest Bailey:  Kipp Brood, Earnest Bailey D  You 2 minutes ago (10:27 AM)   I have faxed this to [facility] to see if they are able to take on patient's PT referral.    Message text    Will update pt via email

## 2019-03-21 NOTE — Telephone Encounter (Signed)
Per [place]:     As directed by Dr. [name], I spoke to the patient to let her know that [facility] is not able to take her on at this time & that we have not been able to locate anyone who is available to take on the patient's PT needs at this time. t verbalized understanding & stated that her mobility is slowly starting to increase. Pt states that she will contact us in a month to see if we might be able to get someone who can take on her PT at that time. Per pt, nothing further needed at this time.    Message text

## 2019-03-21 NOTE — Telephone Encounter (Signed)
Cut down by 10 mg every week until you are at about 10 mg daily. If you start feeling short of breath after cutting down, go back to the previous dose that kept you comfortable  Call with concerns  We will call in prescription for prednisone

## 2019-03-25 NOTE — Telephone Encounter (Signed)
Hi, I was contacted by this lady's ILD physician at [facility] (Dr. [name]) asking about her rituximab infusions.  I can help with this process if you all would like.  Please let me know.   Ruby Cola

## 2019-04-01 DIAGNOSIS — J849 Interstitial pulmonary disease, unspecified: Secondary | ICD-10-CM

## 2019-04-01 NOTE — Telephone Encounter (Signed)
Dr. Jenetta Downer, pt is requesting physical therapy orders through one of the in-network home health providers listed in the media attachment on this email.  Please advise if ok to place order for physical therapy and if you have a preference of home health company out of the ones listed. Thanks!

## 2019-04-03 NOTE — Telephone Encounter (Signed)
Okay to place orders

## 2019-04-10 ENCOUNTER — Other Ambulatory Visit: Payer: Self-pay | Admitting: Pulmonary Disease

## 2019-04-10 DIAGNOSIS — R0609 Other forms of dyspnea: Secondary | ICD-10-CM

## 2019-04-10 DIAGNOSIS — Z9981 Dependence on supplemental oxygen: Secondary | ICD-10-CM

## 2019-04-15 ENCOUNTER — Telehealth: Payer: Self-pay | Admitting: Pulmonary Disease

## 2019-04-15 DIAGNOSIS — J9601 Acute respiratory failure with hypoxia: Secondary | ICD-10-CM

## 2019-04-15 DIAGNOSIS — R531 Weakness: Secondary | ICD-10-CM

## 2019-04-15 DIAGNOSIS — J849 Interstitial pulmonary disease, unspecified: Secondary | ICD-10-CM

## 2019-04-15 NOTE — Telephone Encounter (Signed)
Felicia [name], [facility] is aware [facility] does not have trilogy vents.  DME order is going to be sent to [facility].

## 2019-04-16 ENCOUNTER — Telehealth: Payer: Self-pay | Admitting: Pulmonary Disease

## 2019-04-16 DIAGNOSIS — J8489 Other specified interstitial pulmonary diseases: Secondary | ICD-10-CM

## 2019-04-16 NOTE — Telephone Encounter (Signed)
Called and spoke with Melissa to find out what is missing in regards to pt beginning trilogy vent. Per Lenna Sciara, [facility] requires PFT which it does not look like pt has had one. Also require pt to try/fail BIPAP but does not look like pt has had a sleep study.  Dr. Jenetta Downer, please advise. Thanks!

## 2019-04-16 NOTE — Telephone Encounter (Signed)
Dr.Olalere Trilogy order placed, physical therapy ordered. Dr. [name] given physician order for same day to complete and sign, for Patient to get set up with Rituximab infusions at [facility]. Will fax order to same day once received.

## 2019-04-17 NOTE — Telephone Encounter (Signed)
PFT order has been placed. Left message for patient to call back to get scheduled for a PFT.

## 2019-04-17 NOTE — Telephone Encounter (Signed)
Patient has advanced respiratory failure from nonspecific interstitial pneumonia, on 10Liters of oxygen at rest, will fare better with a device that can assure adequate volumes are delivered to assist her breathing. Sleep study will not be beneficial.  Kindly order a PFT on her.

## 2019-04-18 NOTE — Telephone Encounter (Signed)
Called and spoke to pt. Pt states she had a recent PFT with [facility]. Per Care Everywhere the pt had a PFT in 02/2018. Called and spoke to [facility] with [facility] to see if this can be used. Printed results and faxed to [place] at [phone]. Melissa states she will look into this and call back.

## 2019-04-22 ENCOUNTER — Telehealth: Payer: Self-pay | Admitting: Pulmonary Disease

## 2019-04-22 NOTE — Telephone Encounter (Signed)
Can we authorize PT for 1 time a week for 2 weeks and 2 times a week for 2 weeks. AO please advise.

## 2019-04-23 ENCOUNTER — Inpatient Hospital Stay (HOSPITAL_COMMUNITY)
Admission: EM | Admit: 2019-04-23 | Discharge: 2019-04-28 | DRG: 196 | Disposition: A | Discharge status: Home / Self Care | Payer: 59 | Attending: Internal Medicine | Admitting: Internal Medicine

## 2019-04-23 ENCOUNTER — Other Ambulatory Visit: Payer: Self-pay

## 2019-04-23 ENCOUNTER — Emergency Department (HOSPITAL_COMMUNITY): Payer: 59

## 2019-04-23 ENCOUNTER — Encounter (HOSPITAL_COMMUNITY): Payer: Self-pay | Admitting: Internal Medicine

## 2019-04-23 DIAGNOSIS — E249 Cushing's syndrome, unspecified: Secondary | ICD-10-CM | POA: Diagnosis present

## 2019-04-23 DIAGNOSIS — Z8249 Family history of ischemic heart disease and other diseases of the circulatory system: Secondary | ICD-10-CM | POA: Diagnosis not present

## 2019-04-23 DIAGNOSIS — I5033 Acute on chronic diastolic (congestive) heart failure: Secondary | ICD-10-CM | POA: Diagnosis present

## 2019-04-23 DIAGNOSIS — Z79899 Other long term (current) drug therapy: Secondary | ICD-10-CM

## 2019-04-23 DIAGNOSIS — J9621 Acute and chronic respiratory failure with hypoxia: Secondary | ICD-10-CM | POA: Diagnosis present

## 2019-04-23 DIAGNOSIS — J849 Interstitial pulmonary disease, unspecified: Secondary | ICD-10-CM

## 2019-04-23 DIAGNOSIS — Z810 Family history of intellectual disabilities: Secondary | ICD-10-CM | POA: Diagnosis not present

## 2019-04-23 DIAGNOSIS — I110 Hypertensive heart disease with heart failure: Secondary | ICD-10-CM | POA: Diagnosis present

## 2019-04-23 DIAGNOSIS — B029 Zoster without complications: Secondary | ICD-10-CM | POA: Diagnosis not present

## 2019-04-23 DIAGNOSIS — I272 Pulmonary hypertension, unspecified: Secondary | ICD-10-CM | POA: Diagnosis not present

## 2019-04-23 DIAGNOSIS — N179 Acute kidney failure, unspecified: Secondary | ICD-10-CM | POA: Diagnosis present

## 2019-04-23 DIAGNOSIS — J9600 Acute respiratory failure, unspecified whether with hypoxia or hypercapnia: Secondary | ICD-10-CM | POA: Diagnosis present

## 2019-04-23 DIAGNOSIS — Z86718 Personal history of other venous thrombosis and embolism: Secondary | ICD-10-CM

## 2019-04-23 DIAGNOSIS — Z7901 Long term (current) use of anticoagulants: Secondary | ICD-10-CM

## 2019-04-23 DIAGNOSIS — Z794 Long term (current) use of insulin: Secondary | ICD-10-CM

## 2019-04-23 DIAGNOSIS — Z20822 Contact with and (suspected) exposure to covid-19: Secondary | ICD-10-CM | POA: Diagnosis present

## 2019-04-23 DIAGNOSIS — Z7952 Long term (current) use of systemic steroids: Secondary | ICD-10-CM

## 2019-04-23 DIAGNOSIS — J189 Pneumonia, unspecified organism: Secondary | ICD-10-CM | POA: Diagnosis present

## 2019-04-23 DIAGNOSIS — E1165 Type 2 diabetes mellitus with hyperglycemia: Secondary | ICD-10-CM | POA: Diagnosis present

## 2019-04-23 DIAGNOSIS — R079 Chest pain, unspecified: Secondary | ICD-10-CM

## 2019-04-23 DIAGNOSIS — B0229 Other postherpetic nervous system involvement: Secondary | ICD-10-CM | POA: Diagnosis present

## 2019-04-23 DIAGNOSIS — I10 Essential (primary) hypertension: Secondary | ICD-10-CM | POA: Diagnosis present

## 2019-04-23 DIAGNOSIS — Z9981 Dependence on supplemental oxygen: Secondary | ICD-10-CM | POA: Diagnosis not present

## 2019-04-23 DIAGNOSIS — I959 Hypotension, unspecified: Secondary | ICD-10-CM | POA: Diagnosis present

## 2019-04-23 DIAGNOSIS — E876 Hypokalemia: Secondary | ICD-10-CM | POA: Diagnosis not present

## 2019-04-23 DIAGNOSIS — I2721 Secondary pulmonary arterial hypertension: Secondary | ICD-10-CM | POA: Diagnosis present

## 2019-04-23 DIAGNOSIS — I2723 Pulmonary hypertension due to lung diseases and hypoxia: Secondary | ICD-10-CM | POA: Diagnosis present

## 2019-04-23 DIAGNOSIS — I50810 Right heart failure, unspecified: Secondary | ICD-10-CM | POA: Diagnosis not present

## 2019-04-23 DIAGNOSIS — J8489 Other specified interstitial pulmonary diseases: Principal | ICD-10-CM | POA: Diagnosis present

## 2019-04-23 DIAGNOSIS — E872 Acidosis: Secondary | ICD-10-CM | POA: Diagnosis present

## 2019-04-23 DIAGNOSIS — I5082 Biventricular heart failure: Secondary | ICD-10-CM | POA: Diagnosis present

## 2019-04-23 DIAGNOSIS — I471 Supraventricular tachycardia: Secondary | ICD-10-CM | POA: Diagnosis present

## 2019-04-23 DIAGNOSIS — I5032 Chronic diastolic (congestive) heart failure: Secondary | ICD-10-CM | POA: Diagnosis not present

## 2019-04-23 DIAGNOSIS — Z833 Family history of diabetes mellitus: Secondary | ICD-10-CM | POA: Diagnosis not present

## 2019-04-23 DIAGNOSIS — R0602 Shortness of breath: Secondary | ICD-10-CM | POA: Diagnosis present

## 2019-04-23 DIAGNOSIS — J9601 Acute respiratory failure with hypoxia: Secondary | ICD-10-CM | POA: Diagnosis not present

## 2019-04-23 LAB — COMPREHENSIVE METABOLIC PANEL
ALT: 31 U/L (ref 0–44)
AST: 21 U/L (ref 15–41)
Albumin: 3.2 g/dL — ABNORMAL LOW (ref 3.5–5.0)
Alkaline Phosphatase: 44 U/L (ref 38–126)
Anion gap: 16 — ABNORMAL HIGH (ref 5–15)
BUN: 21 mg/dL — ABNORMAL HIGH (ref 6–20)
CO2: 27 mmol/L (ref 22–32)
Calcium: 8.9 mg/dL (ref 8.9–10.3)
Chloride: 95 mmol/L — ABNORMAL LOW (ref 98–111)
Creatinine, Ser: 1.05 mg/dL — ABNORMAL HIGH (ref 0.44–1.00)
GFR calc Af Amer: >60 mL/min (ref >60)
GFR calc non Af Amer: >60 mL/min (ref >60)
Glucose, Bld: 275 mg/dL — ABNORMAL HIGH (ref 70–99)
Potassium: 4.2 mmol/L (ref 3.5–5.1)
Sodium: 138 mmol/L (ref 135–145)
Total Bilirubin: 0.5 mg/dL (ref 0.3–1.2)
Total Protein: 6.1 g/dL — ABNORMAL LOW (ref 6.5–8.1)

## 2019-04-23 LAB — POCT I-STAT EG7
Acid-Base Excess: 6 mmol/L — ABNORMAL HIGH (ref 0.0–2.0)
Bicarbonate: 31.1 mmol/L — ABNORMAL HIGH (ref 20.0–28.0)
Calcium, Ion: 1.12 mmol/L — ABNORMAL LOW (ref 1.15–1.40)
HCT: 44 % (ref 36.0–46.0)
Hemoglobin: 15 g/dL (ref 12.0–15.0)
O2 Saturation: 84 %
Potassium: 3.9 mmol/L (ref 3.5–5.1)
Sodium: 137 mmol/L (ref 135–145)
TCO2: 33 mmol/L — ABNORMAL HIGH (ref 22–32)
pCO2, Ven: 45.8 mmHg (ref 44.0–60.0)
pH, Ven: 7.441 — ABNORMAL HIGH (ref 7.250–7.430)
pO2, Ven: 48 mmHg — ABNORMAL HIGH (ref 32.0–45.0)

## 2019-04-23 LAB — LACTIC ACID, PLASMA: Lactic Acid, Venous: 3.4 mmol/L (ref 0.5–1.9)

## 2019-04-23 LAB — CBC WITH DIFFERENTIAL/PLATELET
Abs Immature Granulocytes: 0.06 10*3/uL (ref 0.00–0.07)
Basophils Absolute: 0 10*3/uL (ref 0.0–0.1)
Basophils Relative: 0 %
Eosinophils Absolute: 0 10*3/uL (ref 0.0–0.5)
Eosinophils Relative: 0 %
HCT: 44.2 % (ref 36.0–46.0)
Hemoglobin: 12.7 g/dL (ref 12.0–15.0)
Immature Granulocytes: 1 %
Lymphocytes Relative: 3 %
Lymphs Abs: 0.3 10*3/uL — ABNORMAL LOW (ref 0.7–4.0)
MCH: 24.9 pg — ABNORMAL LOW (ref 26.0–34.0)
MCHC: 28.7 g/dL — ABNORMAL LOW (ref 30.0–36.0)
MCV: 86.5 fL (ref 80.0–100.0)
Monocytes Absolute: 0.4 10*3/uL (ref 0.1–1.0)
Monocytes Relative: 4 %
Neutro Abs: 7.7 10*3/uL (ref 1.7–7.7)
Neutrophils Relative %: 92 %
Platelets: 248 10*3/uL (ref 150–400)
RBC: 5.11 MIL/uL (ref 3.87–5.11)
RDW: 20.3 % — ABNORMAL HIGH (ref 11.5–15.5)
WBC: 8.4 10*3/uL (ref 4.0–10.5)
nRBC: 0.2 % (ref 0.0–0.2)

## 2019-04-23 LAB — D-DIMER, QUANTITATIVE: D-Dimer, Quant: <0.27 ug/mL-FEU (ref 0.00–0.50)

## 2019-04-23 LAB — SARS CORONAVIRUS 2 (TAT 6-24 HRS): SARS Coronavirus 2: NEGATIVE

## 2019-04-23 LAB — TROPONIN I (HIGH SENSITIVITY)
Troponin I (High Sensitivity): 88 ng/L — ABNORMAL HIGH (ref <18)
Troponin I (High Sensitivity): 78 ng/L — ABNORMAL HIGH (ref <18)
Troponin I (High Sensitivity): 75 ng/L — ABNORMAL HIGH (ref <18)

## 2019-04-23 LAB — CBG MONITORING, ED: Glucose-Capillary: 184 mg/dL — ABNORMAL HIGH (ref 70–99)

## 2019-04-23 MED ORDER — ACETAMINOPHEN 325 MG PO TABS
650.0000 mg | ORAL_TABLET | Freq: Four times a day (QID) | ORAL | Status: DC | PRN
  Administered 2019-04-24: 03:00:00 650 mg via ORAL
  Filled 2019-04-23: qty 2

## 2019-04-23 MED ORDER — LACTATED RINGERS IV BOLUS
2000.0000 mL | Freq: Once | INTRAVENOUS | Status: AC
  Administered 2019-04-23: 19:00:00 2000 mL via INTRAVENOUS

## 2019-04-23 MED ORDER — ACETAMINOPHEN 650 MG RE SUPP: 650.0000 mg | Freq: Four times a day (QID) | RECTAL | Status: DC | PRN

## 2019-04-23 MED ORDER — ONDANSETRON HCL 4 MG PO TABS: 4.0000 mg | ORAL_TABLET | Freq: Four times a day (QID) | ORAL | Status: DC | PRN

## 2019-04-23 MED ORDER — INSULIN GLARGINE 100 UNIT/ML ~~LOC~~ SOLN
24.0000 [IU] | Freq: Every day | SUBCUTANEOUS | Status: DC
  Administered 2019-04-24 – 2019-04-27 (×5): 24 [IU] via SUBCUTANEOUS
  Filled 2019-04-23 (×6): qty 0.24

## 2019-04-23 MED ORDER — SODIUM CHLORIDE 0.9 % IV SOLN
500.0000 mg | INTRAVENOUS | Status: DC
  Administered 2019-04-24: 500 mg via INTRAVENOUS
  Filled 2019-04-23: qty 500

## 2019-04-23 MED ORDER — VANCOMYCIN HCL 1250 MG/250ML IV SOLN: 1250.0000 mg | INTRAVENOUS | Status: DC

## 2019-04-23 MED ORDER — APIXABAN 5 MG PO TABS
5.0000 mg | ORAL_TABLET | Freq: Two times a day (BID) | ORAL | Status: DC
  Administered 2019-04-24 – 2019-04-28 (×9): 5 mg via ORAL
  Filled 2019-04-23 (×11): qty 1

## 2019-04-23 MED ORDER — ALBUTEROL SULFATE (2.5 MG/3ML) 0.083% IN NEBU: 3.0000 mL | INHALATION_SOLUTION | Freq: Four times a day (QID) | RESPIRATORY_TRACT | Status: DC | PRN

## 2019-04-23 MED ORDER — ATENOLOL 25 MG PO TABS
75.0000 mg | ORAL_TABLET | Freq: Two times a day (BID) | ORAL | Status: DC
  Administered 2019-04-24 – 2019-04-28 (×7): 75 mg via ORAL
  Filled 2019-04-23 (×6): qty 3
  Filled 2019-04-23: qty 1
  Filled 2019-04-23 (×2): qty 3

## 2019-04-23 MED ORDER — INSULIN DEGLUDEC 100 UNIT/ML ~~LOC~~ SOPN: 24.0000 [IU] | PEN_INJECTOR | Freq: Every day | SUBCUTANEOUS | Status: DC

## 2019-04-23 MED ORDER — GUAIFENESIN ER 600 MG PO TB12
600.0000 mg | ORAL_TABLET | Freq: Every day | ORAL | Status: DC
  Administered 2019-04-24 – 2019-04-28 (×5): 600 mg via ORAL
  Filled 2019-04-23 (×5): qty 1

## 2019-04-23 MED ORDER — ZIPRASIDONE MESYLATE 20 MG IM SOLR: 20.0000 mg | INTRAMUSCULAR | Status: DC | PRN

## 2019-04-23 MED ORDER — LORAZEPAM 2 MG/ML IJ SOLN: 2.0000 mg | INTRAMUSCULAR | Status: DC | PRN

## 2019-04-23 MED ORDER — SODIUM CHLORIDE 0.9 % IV SOLN
2.0000 g | Freq: Three times a day (TID) | INTRAVENOUS | Status: DC
  Administered 2019-04-24: 2 g via INTRAVENOUS
  Filled 2019-04-23: qty 2

## 2019-04-23 MED ORDER — VITAMIN D 25 MCG (1000 UNIT) PO TABS
1000.0000 [IU] | ORAL_TABLET | Freq: Every day | ORAL | Status: DC
  Administered 2019-04-24 – 2019-04-28 (×6): 1000 [IU] via ORAL
  Filled 2019-04-23 (×6): qty 1

## 2019-04-23 MED ORDER — FUROSEMIDE 20 MG PO TABS
40.0000 mg | ORAL_TABLET | Freq: Two times a day (BID) | ORAL | Status: DC
  Administered 2019-04-24: 40 mg via ORAL
  Filled 2019-04-23: qty 2

## 2019-04-23 MED ORDER — PREDNISONE 20 MG PO TABS
40.0000 mg | ORAL_TABLET | Freq: Every day | ORAL | Status: DC
  Administered 2019-04-24 – 2019-04-25 (×2): 40 mg via ORAL
  Filled 2019-04-23 (×2): qty 2

## 2019-04-23 MED ORDER — INSULIN ASPART 100 UNIT/ML ~~LOC~~ SOLN
0.0000 [IU] | Freq: Three times a day (TID) | SUBCUTANEOUS | Status: DC
  Administered 2019-04-24: 13:00:00 1 [IU] via SUBCUTANEOUS
  Administered 2019-04-24: 7 [IU] via SUBCUTANEOUS
  Administered 2019-04-25: 2 [IU] via SUBCUTANEOUS
  Administered 2019-04-25: 7 [IU] via SUBCUTANEOUS
  Administered 2019-04-26: 2 [IU] via SUBCUTANEOUS
  Administered 2019-04-26: 5 [IU] via SUBCUTANEOUS
  Administered 2019-04-27 (×2): 7 [IU] via SUBCUTANEOUS
  Administered 2019-04-27 – 2019-04-28 (×2): 5 [IU] via SUBCUTANEOUS
  Administered 2019-04-28: 2 [IU] via SUBCUTANEOUS

## 2019-04-23 MED ORDER — SILDENAFIL CITRATE 20 MG PO TABS
20.0000 mg | ORAL_TABLET | Freq: Two times a day (BID) | ORAL | Status: DC
  Administered 2019-04-24 – 2019-04-28 (×9): 20 mg via ORAL
  Filled 2019-04-23 (×12): qty 1

## 2019-04-23 MED ORDER — ONDANSETRON HCL 4 MG/2ML IJ SOLN: 4.0000 mg | Freq: Four times a day (QID) | INTRAMUSCULAR | Status: DC | PRN

## 2019-04-23 MED ORDER — VANCOMYCIN HCL 2000 MG/400ML IV SOLN
2000.0000 mg | Freq: Once | INTRAVENOUS | Status: AC
  Administered 2019-04-23: 2000 mg via INTRAVENOUS
  Filled 2019-04-23: qty 400

## 2019-04-23 MED ORDER — VERAPAMIL HCL ER 120 MG PO TBCR
120.0000 mg | EXTENDED_RELEASE_TABLET | Freq: Every day | ORAL | Status: DC
  Administered 2019-04-24 – 2019-04-27 (×4): 120 mg via ORAL
  Filled 2019-04-23 (×6): qty 1

## 2019-04-23 MED ORDER — SODIUM CHLORIDE 0.9 % IV SOLN
2.0000 g | Freq: Once | INTRAVENOUS | Status: AC
  Administered 2019-04-23: 2 g via INTRAVENOUS
  Filled 2019-04-23: qty 2

## 2019-04-23 MED ORDER — MYCOPHENOLATE MOFETIL 250 MG PO CAPS
1500.0000 mg | ORAL_CAPSULE | Freq: Two times a day (BID) | ORAL | Status: DC
  Administered 2019-04-24 – 2019-04-28 (×9): 1500 mg via ORAL
  Filled 2019-04-23 (×10): qty 6

## 2019-04-23 MED ORDER — MORPHINE SULFATE (PF) 2 MG/ML IV SOLN
0.5000 mg | Freq: Once | INTRAVENOUS | Status: AC
  Administered 2019-04-23: 0.5 mg via INTRAVENOUS
  Filled 2019-04-23: qty 1

## 2019-04-23 MED ORDER — SULFAMETHOXAZOLE-TRIMETHOPRIM 800-160 MG PO TABS
1.0000 | ORAL_TABLET | ORAL | Status: DC
  Administered 2019-04-24 – 2019-04-26 (×2): 1 via ORAL
  Filled 2019-04-23 (×2): qty 1

## 2019-04-23 MED ORDER — TREPROSTINIL 0.6 MG/ML IN SOLN
18.0000 ug | Freq: Four times a day (QID) | RESPIRATORY_TRACT | Status: DC
  Administered 2019-04-24 – 2019-04-25 (×2): 18 ug via RESPIRATORY_TRACT
  Filled 2019-04-23: qty 2.9

## 2019-04-23 NOTE — Telephone Encounter (Signed)
That's good with me

## 2019-04-23 NOTE — Telephone Encounter (Signed)
Spoke with Melissa, advised that we were awaiting AO's response for PT.  Melissa expressed understanding.  AO please advise on request for PT 1X weekly for 2 weeks, then 2X weekly for 2 weeks.  Thanks!

## 2019-04-23 NOTE — Telephone Encounter (Signed)
Called and left a detailed msg on VM for [place] with [facility] for PT

## 2019-04-23 NOTE — ED Provider Notes (Signed)
[facility] EMERGENCY DEPARTMENT Provider Note   CSN: [id number] Arrival date & time: 04/23/19  1557     History Chief Complaint  Patient presents with  . Shortness of Breath    Felicia [name] is a 49 y.o. female.  HPI She complains of chest pain with shortness of breath, for several days, without significant change.  Chest pain is left-sided and is mostly characterized by a tight feeling.  She has been producing more sputum with cough but it remains white, and "chunky."  She has had some chills without fever.  She has decreased appetite, with some nausea but no vomiting.  She denies fever, weakness or dizziness.  She is taking her usual medications.  She has an albuterol nebulizer but has not been using it, and typically does not have wheezing.  She is on 15 L high flow oxygen per nasal cannula at home.  She was transferred here by EMS who maintained on 15 L but utilize a facemask for administration.  No known sick contacts.  No Covid infection or vaccine, yet.  She was going to try to get a Covid vaccine tomorrow.  There are no other known modifying factors.    Past Medical History:  Diagnosis Date  . Diabetes mellitus without complication ([place])    type 2   . Hypertension   . NSIP (nonspecific interstitial pneumonitis) ([place])   . SVT (supraventricular tachycardia) [facility])     Patient Active Problem List   Diagnosis Date Noted  . Acute respiratory failure with hypoxemia ([place]) 02/23/2019  . CAP (community acquired pneumonia) 03/31/2018  . ILD (interstitial lung disease) ([place]) 03/27/2018  . Palpitations 03/20/2018  . Chronic diastolic HF (heart failure) ([place]) 03/21/2017  . Pulmonary HTN ([place]) 03/21/2017  . Medication management 03/21/2017  . NSIP (nonspecific interstitial pneumonitis) ([place]) 02/03/2017  . Acute on chronic respiratory failure with hypoxemia ([place]) 02/03/2017  . Essential hypertension 02/03/2017  . Dyspnea on exertion   . On home oxygen therapy    . PSVT (paroxysmal supraventricular tachycardia) ([place]) 09/23/2014  . Interstitial pneumonitis ([place]) 09/23/2014  . UNSPEC ALVEOLAR&PARIETOALVEOLAR PNEUMONOPATHY 09/12/2008  . HYPERTENSION 09/11/2008  . BRONCHITIS 09/11/2008    Past Surgical History:  Procedure Laterality Date  . CESAREAN SECTION    . LUNG BIOPSY       OB History   No obstetric history on file.     Family History  Problem Relation Age of Onset  . Hypertension Mother   . Cancer Father   . Diabetes Father   . Hypertension Father   . Diabetes Sister   . Mental retardation Sister   . Cancer Maternal Grandmother   . Cancer Maternal Grandfather   . Cancer Paternal Grandmother   . Cancer Paternal Grandfather     Social History   Tobacco Use  . Smoking status: Never Smoker  . Smokeless tobacco: Never Used  . Tobacco comment: never smoked or used tobacco products  Substance Use Topics  . Alcohol use: No  . Drug use: No    Home Medications Prior to Admission medications   Medication Sig Start Date End Date Taking? Authorizing Provider  acetaminophen (TYLENOL) 500 MG tablet Take 2,000 mg by mouth as needed for moderate pain.    Yes [provider]  albuterol (PROVENTIL HFA;VENTOLIN HFA) 108 (90 Base) MCG/ACT inhaler Inhale 1 puff into the lungs every 6 (six) hours as needed for wheezing or shortness of breath.   Yes [provider]  apixaban Arne Cleveland) 5  MG TABS tablet Take 1 tablet (5 mg total) by mouth 2 (two) times daily. 03/12/19 04/23/19 Yes Olalere, Adewale A, MD  atenolol (TENORMIN) 50 MG tablet Take 75 mg by mouth 2 (two) times daily.  01/08/17  Yes [provider]  cholecalciferol (VITAMIN D3) 25 MCG (1000 UT) tablet Take 1,000 Units by mouth daily.   Yes [provider]  furosemide (LASIX) 40 MG tablet Take 1 tablet (40 mg total) by mouth 2 (two) times daily. 03/31/18  Yes [place] Eric J, DO  guaiFENesin (MUCINEX) 600 MG 12 hr tablet Take 600 mg by mouth daily.    Yes  [provider]  insulin degludec (TRESIBA FLEXTOUCH) 100 UNIT/ML FlexTouch Pen Inject 24 Units into the skin daily.   Yes [provider]  insulin lispro (HUMALOG KWIKPEN) 100 UNIT/ML KwikPen Inject 0-20 Units into the skin 2 (two) times daily. Sliding scale based on sugar level   Yes [provider]  ipratropium-albuterol (DUONEB) 0.5-2.5 (3) MG/3ML SOLN Inhale 3 mLs into the lungs every 6 (six) hours as needed (sob and wheezing).  01/08/17  Yes [provider]  metFORMIN (GLUCOPHAGE-XR) 500 MG 24 hr tablet Take 1,000 mg by mouth at bedtime.  04/10/19  Yes [provider]  mycophenolate (CELLCEPT) 500 MG tablet Take 1,500 mg by mouth 2 (two) times daily.    Yes [provider]  naproxen sodium (ALEVE) 220 MG tablet Take 440 mg by mouth as needed (pain).   Yes [provider]  predniSONE (DELTASONE) 10 MG tablet Take 40 mg by mouth daily with breakfast.   Yes [provider]  sildenafil (REVATIO) 20 MG tablet Take 20 mg by mouth 2 (two) times daily. 04/15/19  Yes [provider]  sulfamethoxazole-trimethoprim (BACTRIM DS) 800-160 MG tablet Take 1 tablet by mouth 3 (three) times a week. 12/24/18  Yes [provider]  Treprostinil (TYVASO REFILL) 0.6 MG/ML SOLN Inhale 18 mcg into the lungs every 6 (six) hours.   Yes [provider]  verapamil (CALAN-SR) 120 MG CR tablet Take 1 tablet (120 mg total) by mouth at bedtime. 09/23/14  Yes Etta Quill, NP  insulin aspart (NOVOLOG) 100 UNIT/ML injection Inject 6 Units into the skin 3 (three) times daily with meals. Patient not taking: Reported on 04/23/2019 03/03/19   Marshell Garfinkel, MD  insulin detemir (LEVEMIR) 100 UNIT/ML injection Inject 0.1 mLs (10 Units total) into the skin 2 (two) times daily. Patient not taking: Reported on 04/23/2019 03/03/19   Marshell Garfinkel, MD    Allergies    Patient has no known allergies.  Review of Systems   Review of  Systems  Physical Exam Updated Vital Signs BP (!) 111/95   Pulse 78   Temp 98 F (36.7 C) (Oral)   Resp (!) 31   Ht _0  (1.651 m)   Wt 98.9 kg   SpO2 99%   BMI 36.28 kg/m   Physical Exam  ED Results / Procedures / Treatments   Labs (all labs ordered are listed, but only abnormal results are displayed) Labs Reviewed  COMPREHENSIVE METABOLIC PANEL - Abnormal; Notable for the following components:      Result Value   Chloride 95 (*)    Glucose, Bld 275 (*)    BUN 21 (*)    Creatinine, Ser 1.05 (*)    Total Protein 6.1 (*)    Albumin 3.2 (*)    Anion gap 16 (*)    All other components within normal limits  CBC WITH DIFFERENTIAL/PLATELET - Abnormal; Notable for the following components:   MCH 24.9 (*)    MCHC 28.7 (*)    RDW 20.3 (*)    Lymphs Abs 0.3 (*)    All other components within normal limits  LACTIC ACID, PLASMA - Abnormal; Notable for the following components:   Lactic Acid, Venous 3.4 (*)    All other components within normal limits  POCT I-STAT EG7 - Abnormal; Notable for the following components:   pH, Ven 7.441 (*)    pO2, Ven 48.0 (*)    Bicarbonate 31.1 (*)    TCO2 33 (*)    Acid-Base Excess 6.0 (*)    Calcium, Ion 1.12 (*)    All other components within normal limits  TROPONIN I (HIGH SENSITIVITY) - Abnormal; Notable for the following components:   Troponin I (High Sensitivity) 88 (*)    All other components within normal limits  SARS CORONAVIRUS 2 (TAT 6-24 HRS)  CULTURE, BLOOD (ROUTINE X 2)  CULTURE, BLOOD (ROUTINE X 2)  LACTIC ACID, PLASMA  TROPONIN I (HIGH SENSITIVITY)    EKG EKG Interpretation  Date/Time:  Tuesday April 23 2019 16:46:31 EDT Ventricular Rate:  82 PR Interval:    QRS Duration: 84 QT Interval:  412 QTC Calculation: 482 R Axis:   119 Text Interpretation: Sinus rhythm Ventricular premature complex Low voltage, precordial leads Probable right ventricular hypertrophy Nonspecific T abnormalities, inferior leads Since last  tracing QT has shortened Otherwise no significant change Confirmed by Daleen Bo [phone]) on 04/23/2019 5:06:28 PM   Radiology DG Chest Port 1 View  Result Date: 04/23/2019 CLINICAL DATA:  Left-sided chest pain and shortness of breath. EXAM: PORTABLE CHEST 1 VIEW COMPARISON:  February 21, 2018 FINDINGS: Mild to moderate severity bilateral infiltrates are seen involving predominantly the mid lung fields and bilateral lung bases. There is no evidence of a pleural effusion or pneumothorax. The cardiac silhouette is moderately enlarged unchanged in size. The visualized skeletal structures are unremarkable. IMPRESSION: 1. Mild to moderate severity bilateral infiltrates. 2. Stable cardiomegaly. Electronically Signed   By: Virgina Norfolk M.D.   On: 04/23/2019 17:02    Procedures .Critical Care Performed by: Daleen Bo, MD Authorized by: Daleen Bo, MD   Critical care provider statement:    Critical care time (minutes):  35   Critical care start time:  04/23/2019 4:00 PM   Critical care end time:  04/23/2019 9:31 PM   Critical care time was exclusive of:  Separately billable procedures and treating other patients   Critical care was necessary to treat or prevent imminent or life-threatening deterioration of the following conditions:  Respiratory failure   Critical care was time spent personally by me on the following activities:  Blood draw for specimens, development of treatment plan with patient or surrogate, discussions with consultants, evaluation of patient's response to treatment, examination of patient, obtaining history from patient or surrogate, ordering and performing treatments and interventions, ordering and review of laboratory studies, pulse oximetry, re-evaluation of patient's condition, review of old charts and ordering and review of radiographic studies   (including critical care time)  Medications Ordered in ED Medications  vancomycin (VANCOREADY) IVPB 2000 mg/400 mL  (2,000 mg Intravenous New Bag/Given 04/23/19 2004)  ziprasidone (GEODON) injection 20 mg (has no administration in time range)  LORazepam (ATIVAN) injection 2 mg (has no administration in time range)  vancomycin (VANCOREADY) IVPB 1250 mg/250 mL (has no administration in time range)  ceFEPIme (MAXIPIME) 2 g in sodium chloride  0.9 % 100 mL IVPB (0 g Intravenous Stopped 04/23/19 1957)  lactated ringers bolus 2,000 mL (2,000 mLs Intravenous New Bag/Given 04/23/19 1905)    ED Course  I have reviewed the triage vital signs and the nursing notes.  Pertinent labs & imaging results that were available during my care of the patient were reviewed by me and considered in my medical decision making (see chart for details).  Clinical Course as of Apr 22 2129  Tue Apr 23, 2019  1720 Blood pressure currently 85 systolic, while resting, in semi-Fowlers position.  Patient updated on current findings and plans.  IV fluid ordered.   [EW]  6812 This time she appears to have less facial swelling and the tongue appears somewhat smaller.  Patient now sitting up and complaining of back pain.  She tells me that her facial swelling started today.  She continues to intermittently groan and does appear uncomfortable.  Still working on IV access.  IV therapy is currently here.   [EW]  1900 Mild elevation  Troponin I (High Sensitivity)(!) [EW]  1900 Normal except chloride low, glucose high, BUN high, creatinine high, total protein low, albumin low, anion gap high  Comprehensive metabolic panel(!) [EW]  7517 Normal except RBC indices abnormal  CBC with Differential(!) [EW]  1901 High  Lactic acid, plasma(!!) [EW]  1947 Normal except pH elevated, PO2 elevated, bicarb high, PCO2 high on venous gas.  POCT I-Stat EG7(!) [EW]  2001 Case discussed with intensivist, regarding their involvement in her care.  He states to limit IV fluids to 500 cc, based on current status.  At this point she has had 300 cc of lactated Ringer's.   Her blood pressure is normal, now, 113/86.  We will proceed with as needed boluses for control of blood pressure and not high volume saline boluses because of her interstitial lung disease.  Intensivist suggests PCCM consultation in the morning for evaluation.   [EW]  2004 At this time the patient is still uncomfortable complains of chest pain.  EKG does not indicate infarct.  Will treat with fentanyl and check delta troponin.   [EW]  2009 Delta troponin is lower.  Troponin I (High Sensitivity)(!) [EW]    Clinical Course User Index [EW] Daleen Bo, MD   MDM Rules/Calculators/A&P                     Wt Readings from Last 3 Encounters:  04/23/19 98.9 kg  03/03/19 105.9 kg  02/03/19 107.5 kg   Temp Readings from Last 3 Encounters:  04/23/19 98 F (36.7 C) (Oral)  03/03/19 (!) 96.5 F (35.8 C) (Axillary)  02/03/19 99 F (37.2 C) (Oral)   BP Readings from Last 3 Encounters:  04/23/19 (!) 111/95  03/03/19 118/78  02/04/19 (!) 115/47   Pulse Readings from Last 3 Encounters:  04/23/19 78  03/03/19 94  02/04/19 78    Patient Vitals for the past 24 hrs:  BP Temp Temp src Pulse Resp SpO2 Height Weight  04/23/19 1905 (!) 111/95 -- -- 78 (!) 31 99 % -- --  04/23/19 1730 108/83 -- -- 80 (!) 24 99 % -- --  04/23/19 1715 (!) 85/52 -- -- 86 (!) 23 98 % -- --  04/23/19 1700 (!) 79/70 -- -- 78 (!) 23 98 % -- --  04/23/19 1645 111/84 -- -- 80 (!) 30 97 % -- --  04/23/19 1630 108/79 -- -- 79 15 97 % -- --  04/23/19 1610 112/82  98 F (36.7 C) Oral 82 (!) 25 93 % -- --  04/23/19 1605 -- -- -- -- -- -- _0  (1.651 m) 98.9 kg  04/23/19 1500 -- -- -- -- -- 99 % -- --     Medical Decision Making: Patient presenting for evaluation of chest discomfort and shortness of breath, with finding of bilateral pulmonary infiltrates on chest x-ray.  No history of congestive heart failure.  History of interstitial lung disease.  Suspect infectious pneumonia.  Oxygenation stable, on facemask oxygen at  15 L.  Patient typically is on 15 L nasal cannula at home.  Transient hypotension, with elevated lactate 3.4.  Patient was treated with IV lactate, 2 L.  Doubt Covid pneumonia.  Consultation with pulmonary service.  Patient is at risk for decompensation but currently stable with oxygenation is 99% on room air.  She is more comfortable with the facemask oxygen versus the nasal cannula which she uses at home, at this time.  Intensivist states that IV fluids need to be limited, and used in boluses of 125-250 cc, as needed for blood pressure control.  Hospitalist contacted for admission.  Anticipate pulmonary consultation, tomorrow.   Abigaile Rossie was evaluated in Emergency Department on 04/23/2019 for the symptoms described in the history of present illness. She was evaluated in the context of the global COVID-19 pandemic, which necessitated consideration that the patient might be at risk for infection with the SARS-CoV-2 virus that causes COVID-19. Institutional protocols and algorithms that pertain to the evaluation of patients at risk for COVID-19 are in a state of rapid change based on information released by regulatory bodies including the CDC and federal and state organizations. These policies and algorithms were followed during the patient's care in the ED.   CRITICAL CARE-yes Performed by: Daleen Bo   Nursing Notes Reviewed/ Care Coordinated Applicable Imaging Reviewed Interpretation of Laboratory Data incorporated into ED treatment   8:12 PM-Consult complete with hospitalist. Patient case explained and discussed.  He agrees to admit patient for further evaluation and treatment. Call ended at 9:30 PM  Plan: Admit   Final Clinical Impression(s) / ED Diagnoses Final diagnoses:  Community acquired pneumonia, unspecified laterality  Interstitial lung disease ([place])  Chest pain, unspecified type    Rx / DC Orders ED Discharge Orders    None       Daleen Bo, MD 04/23/19  2133

## 2019-04-23 NOTE — Telephone Encounter (Signed)
Felicia [name] with [name] would like verbal orders for physical therapy on 04/24/2019. Felicia [name] phone number is [phone].

## 2019-04-23 NOTE — H&P (Signed)
History and Physical    Felicia [name] TKP:[id number] DOB: April 07, 1970 DOA: 04/23/2019  PCP: Aretta Nip, MD   Patient coming from: Home.  Chief Complaint: Shortness of breath.  HPI: Felicia [name] is a 49 y.o. female with history of nonspecific interstitial pneumonitis, pulmonary hypertension, diastolic CHF, hypertension, diabetes mellitus presents to the ER because of worsening shortness of breath over the last 4 to 5 days.  Has been examined cough and left-sided chest pain.  Noticed some rash on the left side of the chest at the same time of worsening shortness of breath.  Denies any nausea vomiting or diarrhea.  Denies any fever or chills.  ED Course: In the ER patient was short of breath and has been on 15 L oxygen which patient uses at home.  Chest x-ray shows bilateral infiltrates.  Initially blood pressure was in the low normal with elevated lactic acid was given fluid bolus of 5 cc and at this time pulmonologist did not to give further fluids and was started on empiric antibiotics and admitted for further management of acute respiratory failure with hypoxia could be exacerbation of patient's pneumonitis/pulmonary hypertension or possible pneumonia.  Labs show creatinine of 1.05 blood glucose high sensitive troponin of 88 and 78.  CBC was largely unremarkable.  EKG showed normal sinus rhythm with nonspecific ST changes.  D-dimer was negative.  Covid testing negative.  Review of Systems: As per HPI, rest all negative.   Past Medical History:  Diagnosis Date  . Diabetes mellitus without complication ([place])    type 2   . Hypertension   . NSIP (nonspecific interstitial pneumonitis) ([place])   . SVT (supraventricular tachycardia) (HCC)     Past Surgical History:  Procedure Laterality Date  . CESAREAN SECTION    . LUNG BIOPSY       reports that she has never smoked. She has never used smokeless tobacco. She reports that she does not drink alcohol or use drugs.  No  Known Allergies  Family History  Problem Relation Age of Onset  . Hypertension Mother   . Cancer Father   . Diabetes Father   . Hypertension Father   . Diabetes Sister   . Mental retardation Sister   . Cancer Maternal Grandmother   . Cancer Maternal Grandfather   . Cancer Paternal Grandmother   . Cancer Paternal Grandfather     Prior to Admission medications   Medication Sig Start Date End Date Taking? Authorizing Provider  acetaminophen (TYLENOL) 500 MG tablet Take 2,000 mg by mouth as needed for moderate pain.    Yes [provider]  albuterol (PROVENTIL HFA;VENTOLIN HFA) 108 (90 Base) MCG/ACT inhaler Inhale 1 puff into the lungs every 6 (six) hours as needed for wheezing or shortness of breath.   Yes [provider]  apixaban (ELIQUIS) 5 MG TABS tablet Take 1 tablet (5 mg total) by mouth 2 (two) times daily. 03/12/19 04/23/19 Yes Olalere, Adewale A, MD  atenolol (TENORMIN) 50 MG tablet Take 75 mg by mouth 2 (two) times daily.  01/08/17  Yes [provider]  cholecalciferol (VITAMIN D3) 25 MCG (1000 UT) tablet Take 1,000 Units by mouth daily.   Yes [provider]  furosemide (LASIX) 40 MG tablet Take 1 tablet (40 mg total) by mouth 2 (two) times daily. 03/31/18  Yes [place] Eric J, DO  guaiFENesin (MUCINEX) 600 MG 12 hr tablet Take 600 mg by mouth daily.    Yes [provider]  insulin degludec (TRESIBA  FLEXTOUCH) 100 UNIT/ML FlexTouch Pen Inject 24 Units into the skin daily.   Yes [provider]  insulin lispro (HUMALOG KWIKPEN) 100 UNIT/ML KwikPen Inject 0-20 Units into the skin 2 (two) times daily. Sliding scale based on sugar level   Yes [provider]  ipratropium-albuterol (DUONEB) 0.5-2.5 (3) MG/3ML SOLN Inhale 3 mLs into the lungs every 6 (six) hours as needed (sob and wheezing).  01/08/17  Yes [provider]  metFORMIN (GLUCOPHAGE-XR) 500 MG 24 hr tablet Take 1,000 mg by mouth at bedtime.  04/10/19  Yes  [provider]  mycophenolate (CELLCEPT) 500 MG tablet Take 1,500 mg by mouth 2 (two) times daily.    Yes [provider]  naproxen sodium (ALEVE) 220 MG tablet Take 440 mg by mouth as needed (pain).   Yes [provider]  predniSONE (DELTASONE) 10 MG tablet Take 40 mg by mouth daily with breakfast.   Yes [provider]  sildenafil (REVATIO) 20 MG tablet Take 20 mg by mouth 2 (two) times daily. 04/15/19  Yes [provider]  sulfamethoxazole-trimethoprim (BACTRIM DS) 800-160 MG tablet Take 1 tablet by mouth 3 (three) times a week. 12/24/18  Yes [provider]  Treprostinil (TYVASO REFILL) 0.6 MG/ML SOLN Inhale 18 mcg into the lungs every 6 (six) hours.   Yes [provider]  verapamil (CALAN-SR) 120 MG CR tablet Take 1 tablet (120 mg total) by mouth at bedtime. 09/23/14  Yes Etta Quill, NP  insulin aspart (NOVOLOG) 100 UNIT/ML injection Inject 6 Units into the skin 3 (three) times daily with meals. Patient not taking: Reported on 04/23/2019 03/03/19   Marshell Garfinkel, MD  insulin detemir (LEVEMIR) 100 UNIT/ML injection Inject 0.1 mLs (10 Units total) into the skin 2 (two) times daily. Patient not taking: Reported on 04/23/2019 03/03/19   Marshell Garfinkel, MD    Physical Exam: Constitutional: Moderately built and nourished. Vitals:   04/23/19 1715 04/23/19 1730 04/23/19 1905 04/23/19 1915  BP: (!) 85/52 108/83 (!) 111/95 (!) 125/99  Pulse: 86 80 78 77  Resp: (!) 23 (!) 24 (!) 31 (!) 31  Temp:      TempSrc:      SpO2: 98% 99% 99% 99%  Weight:      Height:       Eyes: Anicteric no pallor. ENMT: No discharge from the ears eyes nose or mouth. Neck: No mass felt.  No neck rigidity. Respiratory: No longer crepitations. Cardiovascular: S1-S2 heard. Abdomen: Soft nontender bowel sounds present. Musculoskeletal: Mild edema of the right lower extremity. Skin: Rash on the left chest concerning for shingles. Neurologic: Alert awake  oriented time place and person.  Moves all extremities. Psychiatric: Appears normal.   Labs on Admission: I have personally reviewed following labs and imaging studies  CBC: Recent Labs  Lab 04/23/19 1800 04/23/19 1809  WBC 8.4  --   NEUTROABS 7.7  --   HGB 12.7 15.0  HCT 44.2 44.0  MCV 86.5  --   PLT 248  --    Basic Metabolic Panel: Recent Labs  Lab 04/23/19 1800 04/23/19 1809  NA 138 137  K 4.2 3.9  CL 95*  --   CO2 27  --   GLUCOSE 275*  --   BUN 21*  --   CREATININE 1.05*  --   CALCIUM 8.9  --    GFR: Estimated Creatinine Clearance: 75.5 mL/min (A) (by C-G formula based on SCr of 1.05 mg/dL (H)). Liver Function Tests: Recent Labs  Lab 04/23/19 1800  AST 21  ALT 31  ALKPHOS 44  BILITOT 0.5  PROT 6.1*  ALBUMIN 3.2*   No results for input(s): LIPASE, AMYLASE in the last 168 hours. No results for input(s): AMMONIA in the last 168 hours. Coagulation Profile: No results for input(s): INR, PROTIME in the last 168 hours. Cardiac Enzymes: No results for input(s): CKTOTAL, CKMB, CKMBINDEX, TROPONINI in the last 168 hours. BNP (last 3 results) No results for input(s): PROBNP in the last 8760 hours. HbA1C: No results for input(s): HGBA1C in the last 72 hours. CBG: No results for input(s): GLUCAP in the last 168 hours. Lipid Profile: No results for input(s): CHOL, HDL, LDLCALC, TRIG, CHOLHDL, LDLDIRECT in the last 72 hours. Thyroid Function Tests: No results for input(s): TSH, T4TOTAL, FREET4, T3FREE, THYROIDAB in the last 72 hours. Anemia Panel: No results for input(s): VITAMINB12, FOLATE, FERRITIN, TIBC, IRON, RETICCTPCT in the last 72 hours. Urine analysis:    Component Value Date/Time   COLORURINE YELLOW 12/24/2008 1604   APPEARANCEUR CLOUDY (A) 12/24/2008 1604   LABSPEC 1.019 12/24/2008 1604   PHURINE 7.5 12/24/2008 1604   GLUCOSEU NEGATIVE 12/24/2008 1604   HGBUR NEGATIVE 12/24/2008 1604   BILIRUBINUR NEGATIVE 12/24/2008 1604   KETONESUR NEGATIVE  12/24/2008 1604   PROTEINUR NEGATIVE 12/24/2008 1604   UROBILINOGEN 0.2 12/24/2008 1604   NITRITE NEGATIVE 12/24/2008 1604   LEUKOCYTESUR MODERATE (A) 12/24/2008 1604   Sepsis Labs: _0 (procalcitonin:4,lacticidven:4) ) Recent Results (from the past 240 hour(s))  SARS CORONAVIRUS 2 (TAT 6-24 HRS) Nasopharyngeal Nasopharyngeal Swab     Status: None   Collection Time: 04/23/19  4:46 PM   Specimen: Nasopharyngeal Swab  Result Value Ref Range Status   SARS Coronavirus 2 NEGATIVE NEGATIVE Final    Comment: (NOTE) SARS-CoV-2 target nucleic acids are NOT DETECTED. The SARS-CoV-2 RNA is generally detectable in upper and lower respiratory specimens during the acute phase of infection. Negative results do not preclude SARS-CoV-2 infection, do not rule out co-infections with other pathogens, and should not be used as the sole basis for treatment or other patient management decisions. Negative results must be combined with clinical observations, patient history, and epidemiological information. The expected result is Negative. Fact Sheet for Patients: SugarRoll.be Fact Sheet for Healthcare Providers: https://www.woods-mathews.com/ This test is not yet approved or cleared by the [place] FDA and  has been authorized for detection and/or diagnosis of SARS-CoV-2 by FDA under an Emergency Use Authorization (EUA). This EUA will remain  in effect (meaning this test can be used) for the duration of the COVID-19 declaration under Section 56 4(b)(1) of the Act, 21 U.S.C. section 360bbb-3(b)(1), unless the authorization is terminated or revoked sooner. Performed at [facility], [place] [address], [place], [place] [address]      Radiological Exams on Admission: DG Chest Port 1 View  Result Date: 04/23/2019 CLINICAL DATA:  Left-sided chest pain and shortness of breath. EXAM: PORTABLE CHEST 1 VIEW COMPARISON:  February 21, 2018 FINDINGS: Mild  to moderate severity bilateral infiltrates are seen involving predominantly the mid lung fields and bilateral lung bases. There is no evidence of a pleural effusion or pneumothorax. The cardiac silhouette is moderately enlarged unchanged in size. The visualized skeletal structures are unremarkable. IMPRESSION: 1. Mild to moderate severity bilateral infiltrates. 2. Stable cardiomegaly. Electronically Signed   By: Virgina Norfolk M.D.   On: 04/23/2019 17:02    EKG: Independently reviewed.  Normal sinus rhythm with nonspecific changes.  Assessment/Plan Principal Problem:   Acute  respiratory failure (HCC) Active Problems:   Interstitial pneumonitis (HCC)   Essential hypertension   Chronic diastolic HF (heart failure) ([place])   Pulmonary HTN (HCC)   Interstitial lung disease ([place])   CAP (community acquired pneumonia)    1. Acute respiratory failure hypoxia differentials include worsening of patient's nonspecific pneumonitis versus pneumonia and also worsening of the pulmonary hypertension/CHF.  Patient is on empiric antibiotics for pneumonia.  Patient is also on prednisone Lasix.  Follow cultures.  Pulmonary has been notified. 2. Shingles on the left side which is likely causing the chest pain.  On IV acyclovir due to immunocompromise status. 3. Pulmonary hypertension on Tyvaso also on Lasix. 4. History of nonspecific interstitial pneumonitis on CellCept and prednisone. 5. History of DVT on apixaban. 6. Acute renal failure was given fluid bolus.  If creatinine does not improve may have to hold Lasix.  Follow UA. 7. Diabetes mellitus type 2 with hyperglycemia on long-acting insulin which patient takes at bedtime along with sliding scale coverage. 8. Hypertension on atenolol and Cardizem.  Patient blood pressure was in the low normal at admission.  Closely monitor.  Given that patient has acute respiratory failure with hypoxia and also shingles with the multiple comorbidities will need close  monitoring for any further deterioration in inpatient status.   DVT prophylaxis: Apixaban. Code Status: Full code. Family Communication: Discussed with patient. Disposition Plan: Home. Consults called: ER physician discussed with pulmonologist.  Reconsult pulmonary in the morning. Admission status: Inpatient.   Rise Patience MD [facility] Pager [phone].  If 7PM-7AM, please contact night-coverage [email or web address] Password [name]  04/23/2019, 10:01 PM

## 2019-04-23 NOTE — ED Triage Notes (Signed)
PT from Home with several days of Lt CP and SHOB. Pt normal O2 usage 15 liters high flow nasal canula. Pt reports a cough and Hx of CHF. EMS vitals 115/62, 100% on 15 liters, resp 25, HR 76,

## 2019-04-23 NOTE — Progress Notes (Signed)
Pharmacy Antibiotic Note  Felicia [name] is a 49 y.o. female admitted on 04/23/2019 with pneumonia.  Pharmacy has been consulted for Vancomycin dosing.  WBC 8.4, Tmax 98, RR 32, HR 80s, SBP 110s, Scr 1.05  Plan: Vancomycin 2035m x1 then 12572mIV Q24h for 8 days (through 05/01/19) Goal trough 15-20 mcg/mL Goal AUC 400-550 Expected AUC: 479 SCr used: 1.05  F/u clincal progress, cultures, LOT, and adjust antibiotics based on renal function  Height: 5' 5" (165.1 cm) Weight: 218 lb (98.9 kg) IBW/kg (Calculated) : 57  Temp (24hrs), Avg:98 F (36.7 C), Min:98 F (36.7 C), Max:98 F (36.7 C)  Recent Labs  Lab 04/23/19 1800  WBC 8.4  CREATININE 1.05*    Estimated Creatinine Clearance: 75.5 mL/min (A) (by C-G formula based on SCr of 1.05 mg/dL (H)).    No Known Allergies  Antimicrobials this admission: 3/16 vanc >>  3/16 cefepime x1  Dose adjustments this admission: N/A  Microbiology results: 3/16 BCx: sent  Thank you for allowing pharmacy to be a part of this patient's care.  ChKennon HolterPharmD PGY1 Ambulatory Care Pharmacy Resident 04/23/2019 7:04 PM

## 2019-04-24 ENCOUNTER — Inpatient Hospital Stay (HOSPITAL_COMMUNITY): Payer: 59

## 2019-04-24 DIAGNOSIS — B029 Zoster without complications: Secondary | ICD-10-CM

## 2019-04-24 DIAGNOSIS — I5032 Chronic diastolic (congestive) heart failure: Secondary | ICD-10-CM

## 2019-04-24 DIAGNOSIS — J8489 Other specified interstitial pulmonary diseases: Principal | ICD-10-CM

## 2019-04-24 DIAGNOSIS — J9621 Acute and chronic respiratory failure with hypoxia: Secondary | ICD-10-CM

## 2019-04-24 DIAGNOSIS — I10 Essential (primary) hypertension: Secondary | ICD-10-CM

## 2019-04-24 LAB — CBC WITH DIFFERENTIAL/PLATELET
Abs Immature Granulocytes: 0.08 10*3/uL — ABNORMAL HIGH (ref 0.00–0.07)
Basophils Absolute: 0 10*3/uL (ref 0.0–0.1)
Basophils Relative: 0 %
Eosinophils Absolute: 0.1 10*3/uL (ref 0.0–0.5)
Eosinophils Relative: 2 %
HCT: 40.8 % (ref 36.0–46.0)
Hemoglobin: 12 g/dL (ref 12.0–15.0)
Immature Granulocytes: 1 %
Lymphocytes Relative: 9 %
Lymphs Abs: 0.7 10*3/uL (ref 0.7–4.0)
MCH: 25.2 pg — ABNORMAL LOW (ref 26.0–34.0)
MCHC: 29.4 g/dL — ABNORMAL LOW (ref 30.0–36.0)
MCV: 85.7 fL (ref 80.0–100.0)
Monocytes Absolute: 0.9 10*3/uL (ref 0.1–1.0)
Monocytes Relative: 12 %
Neutro Abs: 5.4 10*3/uL (ref 1.7–7.7)
Neutrophils Relative %: 76 %
Platelets: 229 10*3/uL (ref 150–400)
RBC: 4.76 MIL/uL (ref 3.87–5.11)
RDW: 20 % — ABNORMAL HIGH (ref 11.5–15.5)
WBC: 7.2 10*3/uL (ref 4.0–10.5)
nRBC: 0 % (ref 0.0–0.2)

## 2019-04-24 LAB — CBG MONITORING, ED: Glucose-Capillary: 133 mg/dL — ABNORMAL HIGH (ref 70–99)

## 2019-04-24 LAB — GLUCOSE, CAPILLARY
Glucose-Capillary: 340 mg/dL — ABNORMAL HIGH (ref 70–99)
Glucose-Capillary: 210 mg/dL — ABNORMAL HIGH (ref 70–99)

## 2019-04-24 LAB — COMPREHENSIVE METABOLIC PANEL
ALT: 28 U/L (ref 0–44)
AST: 15 U/L (ref 15–41)
Albumin: 2.9 g/dL — ABNORMAL LOW (ref 3.5–5.0)
Alkaline Phosphatase: 37 U/L — ABNORMAL LOW (ref 38–126)
Anion gap: 14 (ref 5–15)
BUN: 20 mg/dL (ref 6–20)
CO2: 28 mmol/L (ref 22–32)
Calcium: 8.5 mg/dL — ABNORMAL LOW (ref 8.9–10.3)
Chloride: 99 mmol/L (ref 98–111)
Creatinine, Ser: 0.77 mg/dL (ref 0.44–1.00)
GFR calc Af Amer: >60 mL/min (ref >60)
GFR calc non Af Amer: >60 mL/min (ref >60)
Glucose, Bld: 118 mg/dL — ABNORMAL HIGH (ref 70–99)
Potassium: 3.4 mmol/L — ABNORMAL LOW (ref 3.5–5.1)
Sodium: 141 mmol/L (ref 135–145)
Total Bilirubin: 0.6 mg/dL (ref 0.3–1.2)
Total Protein: 5.3 g/dL — ABNORMAL LOW (ref 6.5–8.1)

## 2019-04-24 LAB — BRAIN NATRIURETIC PEPTIDE: B Natriuretic Peptide: 1330.3 pg/mL — ABNORMAL HIGH (ref 0.0–100.0)

## 2019-04-24 LAB — PROCALCITONIN: Procalcitonin: <0.1 ng/mL

## 2019-04-24 LAB — HIV ANTIBODY (ROUTINE TESTING W REFLEX): HIV Screen 4th Generation wRfx: NONREACTIVE

## 2019-04-24 LAB — LACTIC ACID, PLASMA: Lactic Acid, Venous: 1.3 mmol/L (ref 0.5–1.9)

## 2019-04-24 MED ORDER — POTASSIUM CHLORIDE CRYS ER 20 MEQ PO TBCR
40.0000 meq | EXTENDED_RELEASE_TABLET | Freq: Every day | ORAL | Status: DC
  Administered 2019-04-24 – 2019-04-28 (×5): 40 meq via ORAL
  Filled 2019-04-24 (×5): qty 2

## 2019-04-24 MED ORDER — DEXTROSE 5 % IV SOLN
750.0000 mg | Freq: Three times a day (TID) | INTRAVENOUS | Status: DC
  Administered 2019-04-24 – 2019-04-27 (×10): 750 mg via INTRAVENOUS
  Filled 2019-04-24 (×12): qty 15

## 2019-04-24 MED ORDER — DEXTROSE 5 % IV SOLN
500.0000 mg | Freq: Once | INTRAVENOUS | Status: AC
  Administered 2019-04-24: 13:00:00 500 mg via INTRAVENOUS
  Filled 2019-04-24: qty 500

## 2019-04-24 MED ORDER — ATENOLOL 50 MG PO TABS
ORAL_TABLET | ORAL | Status: AC
  Filled 2019-04-24: qty 1

## 2019-04-24 MED ORDER — FUROSEMIDE 10 MG/ML IJ SOLN
40.0000 mg | Freq: Three times a day (TID) | INTRAMUSCULAR | Status: DC
  Administered 2019-04-24 – 2019-04-25 (×4): 40 mg via INTRAVENOUS
  Filled 2019-04-24 (×4): qty 4

## 2019-04-24 MED ORDER — MORPHINE SULFATE (PF) 2 MG/ML IV SOLN
1.0000 mg | Freq: Once | INTRAVENOUS | Status: AC
  Administered 2019-04-24: 1 mg via INTRAVENOUS
  Filled 2019-04-24: qty 1

## 2019-04-24 MED ORDER — GABAPENTIN 300 MG PO CAPS
300.0000 mg | ORAL_CAPSULE | Freq: Three times a day (TID) | ORAL | Status: DC
  Administered 2019-04-24 – 2019-04-28 (×13): 300 mg via ORAL
  Filled 2019-04-24: qty 3
  Filled 2019-04-24: qty 1
  Filled 2019-04-24 (×3): qty 3
  Filled 2019-04-24: qty 1
  Filled 2019-04-24 (×3): qty 3
  Filled 2019-04-24: qty 1
  Filled 2019-04-24 (×2): qty 3
  Filled 2019-04-24: qty 1

## 2019-04-24 MED ORDER — ATENOLOL 25 MG PO TABS
ORAL_TABLET | ORAL | Status: AC
  Filled 2019-04-24: qty 1

## 2019-04-24 MED ORDER — VANCOMYCIN HCL 1500 MG/300ML IV SOLN
1500.0000 mg | INTRAVENOUS | Status: DC
  Administered 2019-04-24: 1500 mg via INTRAVENOUS
  Filled 2019-04-24: qty 300

## 2019-04-24 NOTE — Consult Note (Signed)
NAME:  Felicia [name], MRN:  [id number], DOB:  1971-01-19, LOS: 1 ADMISSION DATE:  04/23/2019, CONSULTATION DATE:  04/24/19 REFERRING MD:  Marylyn Ishihara, CHIEF COMPLAINT:  Dyspnea   Brief History   49 year old woman with O2-dependent NSIP, groups 1/3 pulmonary HTN on revatio and tyvaso presenting with acute on chronic hypoxemia.  History of present illness   49 year old woman with O2-dependent NSIP, groups 1/3 pulmonary HTN on revatio and tyvaso presenting with acute on chronic hypoxemia.  Most of her management is at [facility]. She is managed locally for acute issues by Dr. [name].  For her NSIP she is on prednisone 77m/day, cellcept and bactrim ppx.  She comes in with worsening breathing over the past week.  She has a dry cough, not significantly changed.  She may have some leg swelling that she was recently started on lasix to treat.  She has had no fevers.  She is on 15L, usually on 10-15L, she does feel like this is not enough however.  She has some chest tightness worse with deep inspiration.  She also has a shingles infection along her left chest.  Past Medical History  NSIP HTN DM  Significant Hospital Events   3/16 admitted  Consults:    Procedures:    Significant Diagnostic Tests:  CXR personally reviewed, stable severe bilateral airspace disease  Micro Data:  COVID neg  Antimicrobials:  Acyclovir   Interim history/subjective:  Consulted  Objective   Blood pressure 95/68, pulse (!) 37, temperature 98 F (36.7 C), temperature source Oral, resp. rate 20, height _0  (1.651 m), weight 98.9 kg, SpO2 100 %.        Intake/Output Summary (Last 24 hours) at 04/24/2019 1148 Last data filed at 04/23/2019 2223 Gross per 24 hour  Intake 800 ml  Output --  Net 800 ml   Filed Weights   04/23/19 1605  Weight: 98.9 kg    Examination: General: Cushingnoid woman in NAD HENT: MMM, no thrush Lungs: Scattered crackles bilaterally, mildly tachypneic Cardiovascular: RRR,  pronounced P2, ext warm Abdomen: Soft, +BS Extremities: 1+ edema Neuro: moves all 4 ext Skin: left vesicular rash along chest wall with vesicular appearance, scabbed over  Resolved Hospital Problem list   N/A  Assessment & Plan:  Acute on chronic hypoxemic respiratory failure in patient with (A) advanced NSIP (B) groups 1/3 pulmonary HTN (C) possible fluid overloaded state of heart  - D dimer neg, Pct neg so do not think we need to go down that rabbithole. - Would try to push diuresis: IV lasix and diuril ordered - Check limited echo with bubble - Continue PTA vasodilators (tyvaso, modified revatio) and immunosuppressants (cellcept, prednisone) with bactrim ppx - Acyclovir - Watch BMP/Mg - Will follow with you    Labs   CBC: Recent Labs  Lab 04/23/19 1800 04/23/19 1809 04/24/19 0610  WBC 8.4  --  7.2  NEUTROABS 7.7  --  5.4  HGB 12.7 15.0 12.0  HCT 44.2 44.0 40.8  MCV 86.5  --  85.7  PLT 248  --  2938   Basic Metabolic Panel: Recent Labs  Lab 04/23/19 1800 04/23/19 1809 04/24/19 0610  NA 138 137 141  K 4.2 3.9 3.4*  CL 95*  --  99  CO2 27  --  28  GLUCOSE 275*  --  118*  BUN 21*  --  20  CREATININE 1.05*  --  0.77  CALCIUM 8.9  --  8.5*   GFR: Estimated  Creatinine Clearance: 99.1 mL/min (by C-G formula based on SCr of 0.77 mg/dL). Recent Labs  Lab 04/23/19 1800 04/24/19 0610  PROCALCITON  --  <0.10  WBC 8.4 7.2  LATICACIDVEN 3.4* 1.3    Liver Function Tests: Recent Labs  Lab 04/23/19 1800 04/24/19 0610  AST 21 15  ALT 31 28  ALKPHOS 44 37*  BILITOT 0.5 0.6  PROT 6.1* 5.3*  ALBUMIN 3.2* 2.9*   No results for input(s): LIPASE, AMYLASE in the last 168 hours. No results for input(s): AMMONIA in the last 168 hours.  ABG    Component Value Date/Time   PHART 7.410 02/22/2019 2252   PCO2ART 41.2 02/22/2019 2252   PO2ART 92.0 02/22/2019 2252   HCO3 31.1 (H) 04/23/2019 1809   TCO2 33 (H) 04/23/2019 1809   O2SAT 84.0 04/23/2019 1809      Coagulation Profile: No results for input(s): INR, PROTIME in the last 168 hours.  Cardiac Enzymes: No results for input(s): CKTOTAL, CKMB, CKMBINDEX, TROPONINI in the last 168 hours.  HbA1C: Hgb A1c MFr Bld  Date/Time Value Ref Range Status  02/24/2019 09:18 AM 8.6 (H) 4.8 - 5.6 % Final    Comment:    (NOTE) Pre diabetes:          5.7%-6.4% Diabetes:              >6.4% Glycemic control for   <7.0% adults with diabetes   02/03/2017 07:55 AM 6.3 (H) 4.8 - 5.6 % Final    Comment:    (NOTE) Pre diabetes:          5.7%-6.4% Diabetes:              >6.4% Glycemic control for   <7.0% adults with diabetes     CBG: Recent Labs  Lab 04/23/19 2227  GLUCAP 184*    Review of Systems:    Positive Symptoms in bold:  Constitutional fevers, chills, weight loss, fatigue, anorexia, malaise  Eyes decreased vision, double vision, eye irritation  Ears, Nose, Mouth, Throat sore throat, trouble swallowing, sinus congestion  Cardiovascular chest pain, paroxysmal nocturnal dyspnea, lower ext edema, palpitations   Respiratory SOB, cough, DOE, hemoptysis, wheezing  Gastrointestinal nausea, vomiting, diarrhea  Genitourinary burning with urination, trouble urinating  Musculoskeletal joint aches, joint swelling, back pain  Integumentary  rashes, skin lesions  Neurological focal weakness, focal numbness, trouble speaking, headaches  Psychiatric depression, anxiety, confusion  Endocrine polyuria, polydipsia, cold intolerance, heat intolerance  Hematologic abnormal bruising, abnormal bleeding, unexplained nose bleeds  Allergic/Immunologic recurrent infections, hives, swollen lymph nodes     Past Medical History  She,  has a past medical history of Diabetes mellitus without complication ([place]), Hypertension, NSIP (nonspecific interstitial pneumonitis) ([place]), and SVT (supraventricular tachycardia) ([place]).   Surgical History    Past Surgical History:  Procedure Laterality Date  . CESAREAN  SECTION    . LUNG BIOPSY       Social History   reports that she has never smoked. She has never used smokeless tobacco. She reports that she does not drink alcohol or use drugs.   Family History   Her family history includes Cancer in her father, maternal grandfather, maternal grandmother, paternal grandfather, and paternal grandmother; Diabetes in her father and sister; Hypertension in her father and mother; Mental retardation in her sister.   Allergies No Known Allergies   Home Medications  Prior to Admission medications   Medication Sig Start Date End Date Taking? Authorizing Provider  acetaminophen (TYLENOL) 500 MG tablet  Take 2,000 mg by mouth as needed for moderate pain.    Yes [provider]  albuterol (PROVENTIL HFA;VENTOLIN HFA) 108 (90 Base) MCG/ACT inhaler Inhale 1 puff into the lungs every 6 (six) hours as needed for wheezing or shortness of breath.   Yes [provider]  apixaban (ELIQUIS) 5 MG TABS tablet Take 1 tablet (5 mg total) by mouth 2 (two) times daily. 03/12/19 04/23/19 Yes Olalere, Adewale A, MD  atenolol (TENORMIN) 50 MG tablet Take 75 mg by mouth 2 (two) times daily.  01/08/17  Yes [provider]  cholecalciferol (VITAMIN D3) 25 MCG (1000 UT) tablet Take 1,000 Units by mouth daily.   Yes [provider]  furosemide (LASIX) 40 MG tablet Take 1 tablet (40 mg total) by mouth 2 (two) times daily. 03/31/18  Yes [place] Eric J, DO  guaiFENesin (MUCINEX) 600 MG 12 hr tablet Take 600 mg by mouth daily.    Yes [provider]  insulin degludec (TRESIBA FLEXTOUCH) 100 UNIT/ML FlexTouch Pen Inject 24 Units into the skin daily.   Yes [provider]  insulin lispro (HUMALOG KWIKPEN) 100 UNIT/ML KwikPen Inject 0-20 Units into the skin 2 (two) times daily. Sliding scale based on sugar level   Yes [provider]  ipratropium-albuterol (DUONEB) 0.5-2.5 (3) MG/3ML SOLN Inhale 3 mLs into the lungs every 6 (six) hours as  needed (sob and wheezing).  01/08/17  Yes [provider]  metFORMIN (GLUCOPHAGE-XR) 500 MG 24 hr tablet Take 1,000 mg by mouth at bedtime.  04/10/19  Yes [provider]  mycophenolate (CELLCEPT) 500 MG tablet Take 1,500 mg by mouth 2 (two) times daily.    Yes [provider]  naproxen sodium (ALEVE) 220 MG tablet Take 440 mg by mouth as needed (pain).   Yes [provider]  predniSONE (DELTASONE) 10 MG tablet Take 40 mg by mouth daily with breakfast.   Yes [provider]  sildenafil (REVATIO) 20 MG tablet Take 20 mg by mouth 2 (two) times daily. 04/15/19  Yes [provider]  sulfamethoxazole-trimethoprim (BACTRIM DS) 800-160 MG tablet Take 1 tablet by mouth 3 (three) times a week. 12/24/18  Yes [provider]  Treprostinil (TYVASO REFILL) 0.6 MG/ML SOLN Inhale 18 mcg into the lungs every 6 (six) hours.   Yes [provider]  verapamil (CALAN-SR) 120 MG CR tablet Take 1 tablet (120 mg total) by mouth at bedtime. 09/23/14  Yes Etta Quill, NP  insulin aspart (NOVOLOG) 100 UNIT/ML injection Inject 6 Units into the skin 3 (three) times daily with meals. Patient not taking: Reported on 04/23/2019 03/03/19   Marshell Garfinkel, MD  insulin detemir (LEVEMIR) 100 UNIT/ML injection Inject 0.1 mLs (10 Units total) into the skin 2 (two) times daily. Patient not taking: Reported on 04/23/2019 03/03/19   Marshell Garfinkel, MD

## 2019-04-24 NOTE — ED Notes (Signed)
Still awaiting meds from pharmacy

## 2019-04-24 NOTE — ED Notes (Signed)
Checked patient blood sugar it was 133 notified RN [place] of blood sugar patient is resting with call bell in reach

## 2019-04-24 NOTE — ED Notes (Signed)
Lunch Tray Ordered @ 1028.

## 2019-04-24 NOTE — Progress Notes (Signed)
Pharmacy Antibiotic Note  Felicia [name] is a 49 y.o. female admitted on 04/23/2019 with pneumonia.  Pharmacy has been consulted for Vancomycin dosing. Patient also continuing on cefepime and azithromycin for PNA, acyclovir IV for shingles in immunocompromised patient, and Bactrim TIW as PTA. SCr improved to 0.77.  Plan: Increase vancomycin to 1553m IV Q24h for 8 days (through 05/01/19). Goal AUC 400-550 Expected AUC: 447 SCr used: 0.8 Cefepime 2g IV q8h Azithro 5060mIV q24h Acyclovir 75052m~70m80m Adj BW) IV q8h Monitor clinical progress, c/s, renal function F/u de-escalation plan/LOT, vancomycin levels as indicated   Height: _0  (165.1 cm) Weight: 218 lb (98.9 kg) IBW/kg (Calculated) : 57  Temp (24hrs), Avg:98 F (36.7 C), Min:98 F (36.7 C), Max:98 F (36.7 C)  Recent Labs  Lab 04/23/19 1800 04/24/19 0610  WBC 8.4 7.2  CREATININE 1.05* 0.77  LATICACIDVEN 3.4* 1.3    Estimated Creatinine Clearance: 99.1 mL/min (by C-G formula based on SCr of 0.77 mg/dL).    No Known Allergies  Antimicrobials this admission: 3/16 vanc >> 3/16 cefepime >> 3/17 acyclovir >> 3/17 azithro >> PTA Bactrim ppx TIW >>  Microbiology results: 3/16BCx:ngtd Thank you for allowing pharmacy to be a part of this patient's care.  ChriKennon HolterarmD PGY1 Ambulatory Care Pharmacy Resident 04/24/2019 10:21 AM

## 2019-04-24 NOTE — Progress Notes (Signed)
[name]  PROGRESS NOTE    Felicia [name]  OIN:[id number] DOB: Jul 25, 1970 DOA: 04/23/2019 PCP: Felicia Nip, MD   Brief Narrative:   Felicia [name] is a 49 y.o. female with history of nonspecific interstitial pneumonitis, pulmonary hypertension, diastolic CHF, hypertension, diabetes mellitus presents to the ER because of worsening shortness of breath over the last 4 to 5 days.  Has been examined cough and left-sided chest pain.  Noticed some rash on the left side of the chest at the same time of worsening shortness of breath.  Denies any nausea vomiting or diarrhea.  Denies any fever or chills.  04/24/19: She reports she is feeling better. PCCM consulted. Appreciate assistance. Starting lasix. Checking Echo.    Assessment & Plan:   Principal Problem:   Acute respiratory failure (HCC) Active Problems:   Interstitial pneumonitis (HCC)   Essential hypertension   Chronic diastolic HF (heart failure) (HCC)   Pulmonary HTN (HCC)   Interstitial lung disease (HCC)   CAP (community acquired pneumonia)   Shingles  Acute on chronic respiratory failure w/ hypoxia     - on 15L HFNC at home per her report     - Hx of pHTN, ILD     - started on empiric abx of PNA     - PCCM consulted, appreciate assistance     - continue sildenifil and tyvaso; continue inhalers     - add lasix     - check echo     - she is afebrile and WBC is ok  Shingles     - acyclovir x 5 days     - gabapentin for neuralgia  pHTN ILD     - see above  Elevated BNP     - lasix, echo  HTN     - atenolol  Hypokalemia     - replace, check Mg2+  Lactic acidosis     - resolved  DM2     - lantus 24 units     - SSI     - DM diet  Hx of DVT     - eliquis  DVT prophylaxis: eliquis Code Status: FULL Family Communication: None at bedside   Disposition Plan: Not quite at [facility]elin. W/u ongoing  Consultants:   PCCM  ROS:  Reports dyspnea . Remainder 10-pt ROS is negative for all not previously  mentioned.  Subjective: "It still hurts on the side."  Objective: Vitals:   04/24/19 1200 04/24/19 1230 04/24/19 1502 04/24/19 1826  BP: 132/81 (!) 119/93 109/82 129/82  Pulse:  80 95 92  Resp: (!) 24 (!) 25 (!) 31 (!) 30  Temp:   98 F (36.7 C)   TempSrc:   Oral   SpO2:  91% 97% 96%  Weight:   100.7 kg   Height:   _0  (1.651 m)     Intake/Output Summary (Last 24 hours) at 04/24/2019 1923 Last data filed at 04/24/2019 1824 Gross per 24 hour  Intake 1040 ml  Output --  Net 1040 ml   Filed Weights   04/23/19 1605 04/24/19 1502  Weight: 98.9 kg 100.7 kg    Examination:  General: 49 y.o. female resting in bed in NAD, on 15L HFNC Cardiovascular: RRR, +S1, S2, no m/g/r, equal pulses throughout Respiratory: b/l rhales, decreased at bases, slightly increased WOB GI: BS+, NDNT, no masses noted, no organomegaly noted MSK: No e/c/c Skin: left flank/axillary rash c/w shingles Neuro: A&O x 3, no focal deficits Psyc: Appropriate interaction and affect,  calm/cooperative   Data Reviewed: I have personally reviewed following labs and imaging studies.  CBC: Recent Labs  Lab 04/23/19 1800 04/23/19 1809 04/24/19 0610  WBC 8.4  --  7.2  NEUTROABS 7.7  --  5.4  HGB 12.7 15.0 12.0  HCT 44.2 44.0 40.8  MCV 86.5  --  85.7  PLT 248  --  163   Basic Metabolic Panel: Recent Labs  Lab 04/23/19 1800 04/23/19 1809 04/24/19 0610  NA 138 137 141  K 4.2 3.9 3.4*  CL 95*  --  99  CO2 27  --  28  GLUCOSE 275*  --  118*  BUN 21*  --  20  CREATININE 1.05*  --  0.77  CALCIUM 8.9  --  8.5*   GFR: Estimated Creatinine Clearance: 100 mL/min (by C-G formula based on SCr of 0.77 mg/dL). Liver Function Tests: Recent Labs  Lab 04/23/19 1800 04/24/19 0610  AST 21 15  ALT 31 28  ALKPHOS 44 37*  BILITOT 0.5 0.6  PROT 6.1* 5.3*  ALBUMIN 3.2* 2.9*   No results for input(s): LIPASE, AMYLASE in the last 168 hours. No results for input(s): AMMONIA in the last 168 hours. Coagulation  Profile: No results for input(s): INR, PROTIME in the last 168 hours. Cardiac Enzymes: No results for input(s): CKTOTAL, CKMB, CKMBINDEX, TROPONINI in the last 168 hours. BNP (last 3 results) No results for input(s): PROBNP in the last 8760 hours. HbA1C: No results for input(s): HGBA1C in the last 72 hours. CBG: Recent Labs  Lab 04/23/19 2227 04/24/19 1204 04/24/19 1734  GLUCAP 184* 133* 340*   Lipid Profile: No results for input(s): CHOL, HDL, LDLCALC, TRIG, CHOLHDL, LDLDIRECT in the last 72 hours. Thyroid Function Tests: No results for input(s): TSH, T4TOTAL, FREET4, T3FREE, THYROIDAB in the last 72 hours. Anemia Panel: No results for input(s): VITAMINB12, FOLATE, FERRITIN, TIBC, IRON, RETICCTPCT in the last 72 hours. Sepsis Labs: Recent Labs  Lab 04/23/19 1800 04/24/19 0610  PROCALCITON  --  <0.10  LATICACIDVEN 3.4* 1.3    Recent Results (from the past 240 hour(s))  SARS CORONAVIRUS 2 (TAT 6-24 HRS) Nasopharyngeal Nasopharyngeal Swab     Status: None   Collection Time: 04/23/19  4:46 PM   Specimen: Nasopharyngeal Swab  Result Value Ref Range Status   SARS Coronavirus 2 NEGATIVE NEGATIVE Final    Comment: (NOTE) SARS-CoV-2 target nucleic acids are NOT DETECTED. The SARS-CoV-2 RNA is generally detectable in upper and lower respiratory specimens during the acute phase of infection. Negative results do not preclude SARS-CoV-2 infection, do not rule out co-infections with other pathogens, and should not be used as the sole basis for treatment or other patient management decisions. Negative results must be combined with clinical observations, patient history, and epidemiological information. The expected result is Negative. Fact Sheet for Patients: SugarRoll.be Fact Sheet for Healthcare Providers: https://www.woods-mathews.com/ This test is not yet approved or cleared by the [place] FDA and  has been authorized for  detection and/or diagnosis of SARS-CoV-2 by FDA under an Emergency Use Authorization (EUA). This EUA will remain  in effect (meaning this test can be used) for the duration of the COVID-19 declaration under Section 56 4(b)(1) of the Act, 21 U.S.C. section 360bbb-3(b)(1), unless the authorization is terminated or revoked sooner. Performed at [facility], [place] [address] [place], [place] [address]   Culture, blood (routine x 2)     Status: None (Preliminary result)   Collection Time: 04/23/19  6:00 PM  Specimen: BLOOD RIGHT ARM  Result Value Ref Range Status   Specimen Description BLOOD RIGHT ARM  Final   Special Requests   Final    BOTTLES DRAWN AEROBIC AND ANAEROBIC Blood Culture adequate volume   Culture   Final    NO GROWTH < 24 HOURS Performed at [facility], [place] [address], [place], [place] [address]    Report Status PENDING  Incomplete  Culture, blood (routine x 2)     Status: None (Preliminary result)   Collection Time: 04/23/19  6:05 PM   Specimen: BLOOD RIGHT HAND  Result Value Ref Range Status   Specimen Description BLOOD RIGHT HAND  Final   Special Requests   Final    BOTTLES DRAWN AEROBIC AND ANAEROBIC Blood Culture adequate volume   Culture   Final    NO GROWTH < 24 HOURS Performed at [facility], [place] [address], [place], [place] [address]    Report Status PENDING  Incomplete      Radiology Studies: DG Chest Port 1 View  Result Date: 04/23/2019 CLINICAL DATA:  Left-sided chest pain and shortness of breath. EXAM: PORTABLE CHEST 1 VIEW COMPARISON:  February 21, 2018 FINDINGS: Mild to moderate severity bilateral infiltrates are seen involving predominantly the mid lung fields and bilateral lung bases. There is no evidence of a pleural effusion or pneumothorax. The cardiac silhouette is moderately enlarged unchanged in size. The visualized skeletal structures are unremarkable. IMPRESSION: 1. Mild to moderate severity bilateral infiltrates. 2. Stable  cardiomegaly. Electronically Signed   By: Virgina Norfolk M.D.   On: 04/23/2019 17:02     Scheduled Meds: . apixaban  5 mg Oral BID  . atenolol  75 mg Oral BID  . cholecalciferol  1,000 Units Oral Daily  . furosemide  40 mg Intravenous Q8H  . gabapentin  300 mg Oral TID  . guaiFENesin  600 mg Oral Daily  . insulin aspart  0-9 Units Subcutaneous TID WC  . insulin glargine  24 Units Subcutaneous QHS  . mycophenolate  1,500 mg Oral BID  . predniSONE  40 mg Oral Q breakfast  . sildenafil  20 mg Oral BID  . sulfamethoxazole-trimethoprim  1 tablet Oral Once per day on Mon Wed Fri  . Treprostinil  18 mcg Inhalation Q6H  . verapamil  120 mg Oral QHS   Continuous Infusions: . acyclovir Stopped (04/24/19 1518)  . vancomycin       LOS: 1 day    Time spent: 25 minutes spent in the coordination of care today.    Jonnie Finner, DO [facility]  If 7PM-7AM, please contact night-coverage [email or web address] 04/24/2019, 7:23 PM

## 2019-04-24 NOTE — ED Notes (Signed)
RN educates patient on need to try to go back to nasal cannula.  Patient states she cant at this time and stays on NRB

## 2019-04-24 NOTE — ED Notes (Signed)
Patient still SOB and difficulty breathing.  States she needs to stay on the NRB for a while longer

## 2019-04-24 NOTE — ED Notes (Signed)
Patient still wishes to remain on NRB.  RN discussed with patient the need to wean oxygen.  RN also offered to assist patient with repositioning.  Patient states her current position decreases the pain to her side.  Sitting up in bed leaning to right side of stretcher with head hanging over

## 2019-04-24 NOTE — ED Notes (Signed)
SDU/NRB  Breakfast ordered

## 2019-04-25 ENCOUNTER — Inpatient Hospital Stay (HOSPITAL_COMMUNITY): Payer: 59

## 2019-04-25 DIAGNOSIS — I50810 Right heart failure, unspecified: Secondary | ICD-10-CM

## 2019-04-25 DIAGNOSIS — I272 Pulmonary hypertension, unspecified: Secondary | ICD-10-CM

## 2019-04-25 DIAGNOSIS — J9601 Acute respiratory failure with hypoxia: Secondary | ICD-10-CM

## 2019-04-25 LAB — CBC WITH DIFFERENTIAL/PLATELET
Abs Immature Granulocytes: 0.07 10*3/uL (ref 0.00–0.07)
Basophils Absolute: 0 10*3/uL (ref 0.0–0.1)
Basophils Relative: 0 %
Eosinophils Absolute: 0.1 10*3/uL (ref 0.0–0.5)
Eosinophils Relative: 1 %
HCT: 47.1 % — ABNORMAL HIGH (ref 36.0–46.0)
Hemoglobin: 13.8 g/dL (ref 12.0–15.0)
Immature Granulocytes: 1 %
Lymphocytes Relative: 7 %
Lymphs Abs: 0.5 10*3/uL — ABNORMAL LOW (ref 0.7–4.0)
MCH: 24.9 pg — ABNORMAL LOW (ref 26.0–34.0)
MCHC: 29.3 g/dL — ABNORMAL LOW (ref 30.0–36.0)
MCV: 84.9 fL (ref 80.0–100.0)
Monocytes Absolute: 0.9 10*3/uL (ref 0.1–1.0)
Monocytes Relative: 11 %
Neutro Abs: 6.4 10*3/uL (ref 1.7–7.7)
Neutrophils Relative %: 80 %
Platelets: 211 10*3/uL (ref 150–400)
RBC: 5.55 MIL/uL — ABNORMAL HIGH (ref 3.87–5.11)
RDW: 20.1 % — ABNORMAL HIGH (ref 11.5–15.5)
WBC: 8 10*3/uL (ref 4.0–10.5)
nRBC: 0.4 % — ABNORMAL HIGH (ref 0.0–0.2)

## 2019-04-25 LAB — GLUCOSE, CAPILLARY
Glucose-Capillary: 170 mg/dL — ABNORMAL HIGH (ref 70–99)
Glucose-Capillary: 177 mg/dL — ABNORMAL HIGH (ref 70–99)
Glucose-Capillary: 323 mg/dL — ABNORMAL HIGH (ref 70–99)
Glucose-Capillary: 329 mg/dL — ABNORMAL HIGH (ref 70–99)
Glucose-Capillary: 349 mg/dL — ABNORMAL HIGH (ref 70–99)
Glucose-Capillary: 86 mg/dL (ref 70–99)
Glucose-Capillary: 93 mg/dL (ref 70–99)

## 2019-04-25 LAB — BASIC METABOLIC PANEL
Anion gap: 15 (ref 5–15)
BUN: 15 mg/dL (ref 6–20)
CO2: 29 mmol/L (ref 22–32)
Calcium: 8.5 mg/dL — ABNORMAL LOW (ref 8.9–10.3)
Chloride: 97 mmol/L — ABNORMAL LOW (ref 98–111)
Creatinine, Ser: 0.86 mg/dL (ref 0.44–1.00)
GFR calc Af Amer: >60 mL/min (ref >60)
GFR calc non Af Amer: >60 mL/min (ref >60)
Glucose, Bld: 114 mg/dL — ABNORMAL HIGH (ref 70–99)
Potassium: 3.4 mmol/L — ABNORMAL LOW (ref 3.5–5.1)
Sodium: 141 mmol/L (ref 135–145)

## 2019-04-25 LAB — MAGNESIUM: Magnesium: 2.2 mg/dL (ref 1.7–2.4)

## 2019-04-25 MED ORDER — POTASSIUM CHLORIDE 20 MEQ/15ML (10%) PO SOLN
40.0000 meq | Freq: Once | ORAL | Status: AC
  Administered 2019-04-25: 40 meq via ORAL
  Filled 2019-04-25: qty 30

## 2019-04-25 MED ORDER — SODIUM CHLORIDE 0.9% FLUSH
3.0000 mL | Freq: Two times a day (BID) | INTRAVENOUS | Status: DC
  Administered 2019-04-25 – 2019-04-27 (×5): 3 mL via INTRAVENOUS

## 2019-04-25 MED ORDER — METHYLPREDNISOLONE SODIUM SUCC 125 MG IJ SOLR
60.0000 mg | Freq: Three times a day (TID) | INTRAMUSCULAR | Status: DC
  Administered 2019-04-25 – 2019-04-27 (×6): 60 mg via INTRAVENOUS
  Filled 2019-04-25 (×6): qty 2

## 2019-04-25 MED ORDER — METOLAZONE 5 MG PO TABS
5.0000 mg | ORAL_TABLET | Freq: Once | ORAL | Status: AC
  Administered 2019-04-25: 5 mg via ORAL
  Filled 2019-04-25: qty 1

## 2019-04-25 NOTE — Progress Notes (Addendum)
NAME:  Felicia [name], MRN:  [id number], DOB:  Mar 14, 1970, LOS: 2 ADMISSION DATE:  04/23/2019, CONSULTATION DATE:  04/24/19 REFERRING MD:  Marylyn Ishihara, CHIEF COMPLAINT:  Dyspnea   Brief History   49 year old woman with O2-dependent NSIP, groups 1/3 pulmonary HTN on revatio and tyvaso presenting with acute on chronic hypoxemia.  History of present illness   49 year old woman with O2-dependent NSIP, groups 1/3 pulmonary HTN on revatio and tyvaso presenting with acute on chronic hypoxemia.  Most of her management is at [facility] She is managed locally for acute issues by Dr. [name].  For her NSIP she is on prednisone 69m/day, cellcept and bactrim ppx.  She comes in with worsening breathing over the past week.  She has a dry cough, not significantly changed.  She may have some leg swelling that she was recently started on lasix to treat.  She has had no fevers.  She is on 15L, usually on 10-15L, she does feel like this is not enough however.  She has some chest tightness worse with deep inspiration.  She also has a shingles infection along her left chest.  Past Medical History  NSIP HTN DM  Significant Hospital Events   3/16 admitted  Consults:    Procedures:    Significant Diagnostic Tests:  CXR personally reviewed, stable severe bilateral airspace disease  Micro Data:  COVID neg  Antimicrobials:  Acyclovir   Interim history/subjective:  Slept poorly.  Intolerant of tyvasso due to severe coughing fits.  This has been an intermittent issue at home that she has been self-titrating medication for.  She notices her fingertips are blue this AM.  Objective   Blood pressure 116/78, pulse (!) 109, temperature 97.8 F (36.6 C), temperature source Oral, resp. rate (!) 31, height _0  (1.651 m), weight 96.8 kg, SpO2 94 %.        Intake/Output Summary (Last 24 hours) at 04/25/2019 0917 Last data filed at 04/25/2019 0600 Gross per 24 hour  Intake 1206.68 ml  Output --  Net 1206.68 ml    Filed Weights   04/23/19 1605 04/24/19 1502 04/25/19 0629  Weight: 98.9 kg 100.7 kg 96.8 kg    Examination: General: Cushingnoid woman in NAD HENT: MMM, no thrush Lungs: Scattered crackles bilaterally, mildly tachypneic Cardiovascular: RRR, pronounced P2, fingertips cyanotic Abdomen: Soft, +BS Extremities: trace edema Neuro: moves all 4 ext Skin: left vesicular rash along chest wall with vesicular appearance, scabbed over  Resolved Hospital Problem list   N/A  Assessment & Plan:   A: Acute on chronic hypoxemic respiratory failure in patient with (A) advanced NSIP PFT Sept 2019 Ratio 0.96, FEV1 1.18L (41% pred), FVC 1.24L (35% pred), DLCO 19% pred Not transplant candidate due to weight at present (needs BMI < 30)  (B) groups 1/3 pulmonary HTN;  05/05/17 RHC PVmean 38, Wedge 12, CI 2.0, PVR 6.3 12/05/14 echo bubble with positive late bubble  (C) likely fluid overloaded state of heart   P: - Needs strict I/O, not captured with purewick due to leak, place foley - F/u limited echo with bubble to give [place] where we are - Continue PTA vasodilators (modified revatio) and immunosuppressants (cellcept) with bactrim ppx - Acyclovir for   - Lasix, dose of metolazone today - Watch BMP/Mg  - Going to increase steroids for possible NSIP flare  - Hold tyvaso (18 mcg, 3 puffs QID) given intolerance but may need to push this if has rebound; low dose so not sure it will  make much difference; could switch to oral or IV prostacyclin but with degree of type 3 pHTN may have unpredictable result.   Regarding this and potential need for inotrope, have asked Dr. Aundra Dubin (advanced HF) to consult, appreciate help  - I have reached out to [facility] transfer center, awaiting call back, she has not been seen there in over a year unfortunately   The patient is critically ill with multiple organ systems failure and requires high complexity decision making for assessment and support, frequent  evaluation and titration of therapies, application of advanced monitoring technologies and extensive interpretation of multiple databases. Critical Care Time devoted to patient care services described in this note independent of APP/resident time (if applicable)  is 33 minutes.   Erskine Emery MD [facility] 04/25/2019 9:35 AM Personal pager: [phone] If unanswered, please page CCM On-call: [phone]

## 2019-04-25 NOTE — H&P (View-Only) (Signed)
  Advanced Heart Failure Team Consult Note   Primary Physician: Rankins, Victoria R, MD PCP-Cardiologist:  No primary care provider on file.  PAH MD: Dr Fortin Pulmonary: Dr Olalere  Reason for Consultation: Pulmonary Hypertension   HPI:    Felicia [name] is seen today for evaluation of pulmonary hypertension at the request of Dr Smith.   Felicia [name] is a 48 year old with history of NSIP since 2010 and has been on chronic prednisione, PAH, SVT, chronic respiratory failure on oxygen on 12 liters at rest and 15 liters exertion, DM, DVT 01/2019 on eliquis, and HTN.   Followed closely at [facility] by Dr Fortin for PAH. Has been treated with revatio and tyvaso. Most recent cath 04/2017 at [facility] RA 8, PA 56/28 (38), PCWP 12, CO 4.2, CI 2, and PVR 6.3.Rheumatologic workup back in 2011 at the time of their diagnosis while D was negative except for mildly elevated aldolase. Anti-Jo, RNP, SM, rheumatoid factor, CCP, double-stranded DNA, ANA etc. were all normal   Admitted to [facility] in January of this year with increased dyspnea. HRCT was c/w chronic fibrotic ILD. Placed on steroids,  IV rituximab + tyvaso.   Prior to admit she developed increased shortness of breath and leg edema. She had been taking lasix 40 mg twice a day.  Says she had been using Tyvaso but she no longer thinks it is working. No recent lightheaded or syncopal episode.  Taking all medications.   Presented to [facility] with increased SOB. Says she is intolerant tyvaso due to cough. On admit she was on 15 liters HFNC.  CXR with bilateral infiltrates.Pertinent admission labs included: WBC 7.2, Hgb 12, 0.77, K 3.4. Pulmonary consulted with recommendations for limited echo with bubble, and IV diuresis.   Complaining of pain on the left from shingles. Remains SOB with exertion.   Echo 02/2018 EF 55%    Review of Systems: [y] = yes, [ ] = no   . General: Weight gain [ ]; Weight loss [ ]; Anorexia [ ]; Fatigue [ Y]; Fever [ ]; Chills [ ];  Weakness [ ]  . Cardiac: Chest pain/pressure [ ]; Resting SOB Y]; Exertional SOB [Y ]; Orthopnea [Y ]; Pedal Edema [ Y]; Palpitations [ ]; Syncope [ ]; Presyncope [ ]; Paroxysmal nocturnal dyspnea[ ]  . Pulmonary: Cough [ ]; Wheezing[ ]; Hemoptysis[ ]; Sputum [ ]; Snoring [ ]  . GI: Vomiting[ ]; Dysphagia[ ]; Melena[ ]; Hematochezia [ ]; Heartburn[ ]; Abdominal pain [ ]; Constipation [ ]; Diarrhea [ ]; BRBPR [ ]  . GU: Hematuria[ ]; Dysuria [ ]; Nocturia[ ]  . Vascular: Pain in legs with walking [ ]; Pain in feet with lying flat [ ]; Non-healing sores [ ]; Stroke [ ]; TIA [ ]; Slurred speech [ ];  . Neuro: Headaches[ ]; Vertigo[ ]; Seizures[ ]; Paresthesias[ ];Blurred vision [ ]; Diplopia [ ]; Vision changes [ ]  . Ortho/Skin: Arthritis [ ]; Joint pain [Y]; Muscle pain [ ]; Joint swelling [ ]; Back Pain [ ]; Rash [ ]  . Psych: Depression[Y ]; Anxiety[ ]  . Heme: Bleeding problems [ ]; Clotting disorders [ ]; Anemia [ ]  . Endocrine: Diabetes [Y ]; Thyroid dysfunction[ ]  Home Medications Prior to Admission medications   Medication Sig Start Date End Date Taking? Authorizing Provider  acetaminophen (TYLENOL) 500 MG tablet Take 2,000 mg by mouth as needed for moderate pain.    Yes [provider]  albuterol (PROVENTIL   HFA;VENTOLIN HFA) 108 (90 Base) MCG/ACT inhaler Inhale 1 puff into the lungs every 6 (six) hours as needed for wheezing or shortness of breath.   Yes [provider]  apixaban (ELIQUIS) 5 MG TABS tablet Take 1 tablet (5 mg total) by mouth 2 (two) times daily. 03/12/19 04/23/19 Yes Olalere, Adewale A, MD  atenolol (TENORMIN) 50 MG tablet Take 75 mg by mouth 2 (two) times daily.  01/08/17  Yes [provider]  cholecalciferol (VITAMIN D3) 25 MCG (1000 UT) tablet Take 1,000 Units by mouth daily.   Yes [provider]  furosemide (LASIX) 40 MG tablet Take 1 tablet (40 mg total) by mouth 2 (two) times daily. 03/31/18  Yes [place] Eric J, DO  guaiFENesin  (MUCINEX) 600 MG 12 hr tablet Take 600 mg by mouth daily.    Yes [provider]  insulin degludec (TRESIBA FLEXTOUCH) 100 UNIT/ML FlexTouch Pen Inject 24 Units into the skin daily.   Yes [provider]  insulin lispro (HUMALOG KWIKPEN) 100 UNIT/ML KwikPen Inject 0-20 Units into the skin 2 (two) times daily. Sliding scale based on sugar level   Yes [provider]  ipratropium-albuterol (DUONEB) 0.5-2.5 (3) MG/3ML SOLN Inhale 3 mLs into the lungs every 6 (six) hours as needed (sob and wheezing).  01/08/17  Yes [provider]  metFORMIN (GLUCOPHAGE-XR) 500 MG 24 hr tablet Take 1,000 mg by mouth at bedtime.  04/10/19  Yes [provider]  mycophenolate (CELLCEPT) 500 MG tablet Take 1,500 mg by mouth 2 (two) times daily.    Yes [provider]  naproxen sodium (ALEVE) 220 MG tablet Take 440 mg by mouth as needed (pain).   Yes [provider]  predniSONE (DELTASONE) 10 MG tablet Take 40 mg by mouth daily with breakfast.   Yes [provider]  sildenafil (REVATIO) 20 MG tablet Take 20 mg by mouth 2 (two) times daily. 04/15/19  Yes [provider]  sulfamethoxazole-trimethoprim (BACTRIM DS) 800-160 MG tablet Take 1 tablet by mouth 3 (three) times a week. 12/24/18  Yes [provider]  Treprostinil (TYVASO REFILL) 0.6 MG/ML SOLN Inhale 18 mcg into the lungs every 6 (six) hours.   Yes [provider]  verapamil (CALAN-SR) 120 MG CR tablet Take 1 tablet (120 mg total) by mouth at bedtime. 09/23/14  Yes Etta Quill, NP  insulin aspart (NOVOLOG) 100 UNIT/ML injection Inject 6 Units into the skin 3 (three) times daily with meals. Patient not taking: Reported on 04/23/2019 03/03/19   Marshell Garfinkel, MD  insulin detemir (LEVEMIR) 100 UNIT/ML injection Inject 0.1 mLs (10 Units total) into the skin 2 (two) times daily. Patient not taking: Reported on 04/23/2019 03/03/19   Marshell Garfinkel, MD    Past Medical History: Past  Medical History:  Diagnosis Date  . Diabetes mellitus without complication ([place])    type 2   . Hypertension   . NSIP (nonspecific interstitial pneumonitis) ([place])   . SVT (supraventricular tachycardia) ([facility])     Past Surgical History: Past Surgical History:  Procedure Laterality Date  . CESAREAN SECTION    . LUNG BIOPSY      Family History: Family History  Problem Relation Age of Onset  . Hypertension Mother   . Cancer Father   . Diabetes Father   . Hypertension Father   . Diabetes Sister   . Mental retardation Sister   . Cancer Maternal Grandmother   . Cancer Maternal Grandfather   . Cancer Paternal Grandmother   .  Cancer Paternal Grandfather     Social History: Social History   Socioeconomic History  . Marital status: Single    Spouse name: Not on file  . Number of children: Not on file  . Years of education: Not on file  . Highest education level: Not on file  Occupational History  . Not on file  Tobacco Use  . Smoking status: Never Smoker  . Smokeless tobacco: Never Used  . Tobacco comment: never smoked or used tobacco products  Substance and Sexual Activity  . Alcohol use: No  . Drug use: No  . Sexual activity: Not on file  Other Topics Concern  . Not on file  Social History Narrative   One daugther.  49 year old [employer] student.     Social Determinants of Health   Financial Resource Strain:   . Difficulty of Paying Living Expenses:   Food Insecurity:   . Worried About Running Out of Food in the Last Year:   . Ran Out of Food in the Last Year:   Transportation Needs:   . Lack of Transportation (Medical):   . Lack of Transportation (Non-Medical):   Physical Activity:   . Days of Exercise per Week:   . Minutes of Exercise per Session:   Stress:   . Feeling of Stress :   Social Connections:   . Frequency of Communication with Friends and Family:   . Frequency of Social Gatherings with Friends and Family:   . Attends Religious Services:   .  Active Member of Clubs or Organizations:   . Attends Club or Organization Meetings:   . Marital Status:     Allergies:  No Known Allergies  Objective:    Vital Signs:   Temp:  [97.8 F (36.6 C)-98.8 F (37.1 C)] 97.8 F (36.6 C) (03/18 0815) Pulse Rate:  [75-109] 109 (03/18 0853) Resp:  [17-32] 31 (03/18 0853) BP: (109-132)/(77-93) 116/78 (03/18 0815) SpO2:  [91 %-99 %] 94 % (03/18 0853) Weight:  [96.8 kg-100.7 kg] 96.8 kg (03/18 0629) Last BM Date: 04/22/19  Weight change: Filed Weights   04/23/19 1605 04/24/19 1502 04/25/19 0629  Weight: 98.9 kg 100.7 kg 96.8 kg    Intake/Output:   Intake/Output Summary (Last 24 hours) at 04/25/2019 1127 Last data filed at 04/25/2019 0600 Gross per 24 hour  Intake 1206.68 ml  Output --  Net 1206.68 ml      Physical Exam    General:  Cushingoid appearance  HEENT: normal Neck: supple. JVP difficult to assess due to body habitus . Carotids 2+ bilat; no bruits. No lymphadenopathy or thyromegaly appreciated. Cor: PMI nondisplaced. Regular rate & rhythm. No rubs, gallops or murmurs. Lungs: clear on 15 liters [place]  Abdomen: soft, nontender, nondistended. No hepatosplenomegaly. No bruits or masses. Good bowel sounds. Extremities: no cyanosis, clubbing, rash, edema Neuro: alert & orientedx3, cranial nerves grossly intact. moves all 4 extremities w/o difficulty. Affect pleasant   Telemetry   SR 80-90s   EKG    SR 82 bpm   Labs   Basic Metabolic Panel: Recent Labs  Lab 04/23/19 1800 04/23/19 1809 04/24/19 0610 04/25/19 0248  NA 138 137 141 141  K 4.2 3.9 3.4* 3.4*  CL 95*  --  99 97*  CO2 27  --  28 29  GLUCOSE 275*  --  118* 114*  BUN 21*  --  20 15  CREATININE 1.05*  --  0.77 0.86  CALCIUM 8.9  --  8.5* 8.5*  MG  --   --   --    2.2    Liver Function Tests: Recent Labs  Lab 04/23/19 1800 04/24/19 0610  AST 21 15  ALT 31 28  ALKPHOS 44 37*  BILITOT 0.5 0.6  PROT 6.1* 5.3*  ALBUMIN 3.2* 2.9*   No results for  input(s): LIPASE, AMYLASE in the last 168 hours. No results for input(s): AMMONIA in the last 168 hours.  CBC: Recent Labs  Lab 04/23/19 1800 04/23/19 1809 04/24/19 0610 04/25/19 0248  WBC 8.4  --  7.2 8.0  NEUTROABS 7.7  --  5.4 6.4  HGB 12.7 15.0 12.0 13.8  HCT 44.2 44.0 40.8 47.1*  MCV 86.5  --  85.7 84.9  PLT 248  --  229 211    Cardiac Enzymes: No results for input(s): CKTOTAL, CKMB, CKMBINDEX, TROPONINI in the last 168 hours.  BNP: BNP (last 3 results) Recent Labs    02/03/19 2336 02/22/19 2321 04/24/19 0530  BNP 718.1* 612.0* 1,330.3*    ProBNP (last 3 results) No results for input(s): PROBNP in the last 8760 hours.   CBG: Recent Labs  Lab 04/24/19 1734 04/24/19 2136 04/25/19 0031 04/25/19 0400 04/25/19 0808  GLUCAP 340* 210* 170* 86 93    Coagulation Studies: No results for input(s): LABPROT, INR in the last 72 hours.   Imaging    No results found.   Medications:     Current Medications: . apixaban  5 mg Oral BID  . atenolol  75 mg Oral BID  . cholecalciferol  1,000 Units Oral Daily  . furosemide  40 mg Intravenous Q8H  . gabapentin  300 mg Oral TID  . guaiFENesin  600 mg Oral Daily  . insulin aspart  0-9 Units Subcutaneous TID WC  . insulin glargine  24 Units Subcutaneous QHS  . methylPREDNISolone (SOLU-MEDROL) injection  60 mg Intravenous Q8H  . mycophenolate  1,500 mg Oral BID  . potassium chloride  40 mEq Oral Daily  . sildenafil  20 mg Oral BID  . sulfamethoxazole-trimethoprim  1 tablet Oral Once per day on Mon Wed Fri  . verapamil  120 mg Oral QHS     Infusions: . acyclovir 750 mg (04/25/19 0513)       Assessment/Plan   1. Acute/Chronic Respiratory Failure, multifactorial with PAH, NSIP  On 15 HFNC with stable O2 sats. At home she is on 12 liters at rest and 15 liters with exertion.   2. PAH Dating back 10 years . WHO Group 1 and 3. Remains on HFNC Has been on combination therapy - sildenafil + Tyvaso.  Stopped  using Tyvaso due to cough. ? Cough is related to volume overload.  Last RHC was back in 2019 PVR > 6.  - May need repeat RHC.  - If intolerant tyvaso will need to discuss with Dr [name].   3. NSIP  Last received rituximab 02/2019  On steroids + cellcept  4. A/C Diastolic HF  -ECHO 9937 EF 55%. Repeat ECHO today.  -Overloaded on admit but looks euvolemic today. Give one more dose of IV lasix.  - Renal function stable.   5. H/O DVT 2020 On elqiuis 5 mg twice a day.   6. [place] On airborne precautions.  7. H/O SVT On verapamil     Length of Stay: 2  Amy Clegg, NP  04/25/2019, 11:27 AM  Advanced Heart Failure Team Pager [phone] (M-F; 7a - 4p)  Please contact [facility] for night-coverage after hours (4p -7a ) and weekends on [email or web address]  Patient seen with NP, agree  with the above note.  She has ILD due to presumed NSIP and has been on Cellcept and prednisone at home as well as rituximab infusions. Last HRCT chest showed chronic fibrotic lung disease.  She has been followed at [facility] for ILD as well as pulmonary hypertension.  She is thought to have a combination of group 1 and group 3 PH.  She has been on sildenafil 20 mg tid and also Tyvaso (used because of ILD/parenchymal lung disease). She does not like taking Tyvaso because it makes her cough and wants to stop it.   She was admitted with increased dyspnea.  CXR with bilateral infiltrates, BNP elevated at 1330.  She was started on Solumedrol as well as IV Lasix, she was continued on Cellcept.  She is on 15 L HFNC at home with exertion, has been on 15 L here.  I/Os have been poorly recorded but her weight is down.  She feels better.   Echo was done today and reviewed.  LV EF normal with D-shaped interventricular septum and moderately dilated/moderate-severely dysfunctional RV, PASP 66 mmHg estimated.  The IVC was small/collapsed. Negative bubble study.   General: NAD, cushingoid.  Neck: Thick, JVP difficult, no thyromegaly or  thyroid nodule.  Lungs: Dry crackles dependently.  CV: Nondisplaced PMI.  Heart regular S1/S2, no S3/S4, no murmur.  No peripheral edema.  No carotid bruit.  Normal pedal pulses.  Abdomen: Soft, nontender, no hepatosplenomegaly, no distention.  Skin: Intact without lesions or rashes.  Neurologic: Alert and oriented x 3.  Psych: Normal affect. Extremities: No clubbing or cyanosis.  HEENT: Normal.   1. Acute on chronic hypoxemic respiratory failure: Multifactorial.  Has ILD/NSIP with possible flare as well as pulmonary hypertension/RV failure.  At baseline, on 10-15 L HFNC.   She has felt better with diuresis and IV Solumedrol.  - Continue treatment of ILD flare per pulmonary, on Solumedrol.  - She is on Lasix 40 mg IV every 8 hrs => exam difficult for volume but IVC not dilated on echo, think she is nearing adequate diuresis.  Would probably stop IV Lasix after today.  2. Pulmonary hypertension: She has been followed at [facility], thought to have mixed group 1 and group 3 (ILD/NSIP) PH.  She has been on sildenafil and Tyvaso.  INCREASE study would suggest benefit for Tyvaso in patients like her.  She does not like Tyvaso because it seems to trigger coughing, and she wants to stop it.  Echo shows D-shaped interventricular septum with moderately dilated and mod-severely dysfunctional RV with estimated PASP 66 mmHg, bubble study negative.  The IVC is not dilated.   - Continue sildenafil.  - I talked with her about restarting Tyvaso for now.  I do not want her to get rebound elevation in her PA pressure and we do not have many options for getting her on additional therapy acutely.  She agrees to restart Tyvaso (may do better with it now that she is diuresed).  - Will plan RHC tomorrow to formally assess filling pressures, PA pressure, and cardiac output. I discussed risks/benefits of procedure with her and she agrees.  - Ultimately, will need to get her back to her PH MD at [facility] after discharge for possible  therapy adjustment.  3. RV failure: She appears by echo (exam is difficult) to be well-diuresed at this point.  - Would stop IV Lasix after today.  - RHC tomorrow for formal evaluation.   4. Shingles: Now scabbed over, she can come off precautions.    5. H/o SVT: On verapamil and atenolol, quiescent.  6. H/o DVT in 2020: On Eliquis 5 mg bid. Hold tomorrow for cath.  7. ILD: NSIP.  She is on steroids + Cellcept at home, followed at [facility] as outpatient.  She had last Rituximab infusion in 1/21.   Loralie Champagne 04/25/2019 4:36 PM  .

## 2019-04-25 NOTE — Progress Notes (Signed)
[name]  PROGRESS NOTE    Felicia [name]  KMM:[id number] DOB: 03/20/1970 DOA: 04/23/2019 PCP: Aretta Nip, MD   Brief Narrative:   Felicia [name] a 49 y.o.femalewithhistory of nonspecific interstitial pneumonitis, pulmonary hypertension, diastolic CHF, hypertension, diabetes mellitus presents to the ER because of worsening shortness of breath over the last 4 to 5 days. Has been examined cough and left-sided chest pain. Noticed some rash on the left side of the chest at the same time of worsening shortness of breath. Denies any nausea vomiting or diarrhea. Denies any fever or chills.  04/25/19: Awaiting echo. Had problem with tyvaso last night. Pulm looking into other options. HF team consulted. Appreciate assistance. Continue lasix through today.     Assessment & Plan:   Principal Problem:   Acute respiratory failure (HCC) Active Problems:   Interstitial pneumonitis (HCC)   Essential hypertension   Chronic diastolic HF (heart failure) (HCC)   Pulmonary HTN (HCC)   Interstitial lung disease (HCC)   CAP (community acquired pneumonia)   Shingles  Acute on chronic respiratory failure w/ hypoxia     - on 15L HFNC at home per her report     - Hx of pHTN, ILD     - started on empiric abx of PNA     - PCCM consulted, appreciate assistance     - continue sildenifil and tyvaso; continue inhalers     - add lasix     - check echo     - she is afebrile and WBC is ok     - 3/18: she is on 15L HFNC but says she is not quite back at baseline; pulm and HF team reviewing. Appreciate assistance.   Shingles     - acyclovir x 5 days     - gabapentin for neuralgia     - 3/18: she reports gabapentin is helping  pHTN ILD     - see above  Elevated BNP     - lasix, echo     - 3/18: Echo pending  HTN     - atenolol     - 3/18: BP is ok today, continue atenolol; she's getting another dose of lasix  Hypokalemia     - replace, check Mg2+     - 3/18: continue K+; Mg2+  is ok.   Lactic acidosis     - resolved  DM2     - lantus 24 units     - SSI     - DM diet     - 3/18: glucose is ok, continue current tx  Hx of DVT     - eliquis  DVT prophylaxis: eliquis Code Status: FULL   Disposition Plan: She is not back to baseline respiratory status, but she says she is improving. HF team to review. Home soon?  Consultants:   Pulmonology  Cardiology  Antimicrobials:  . Bactrim, acyclovir   Subjective: "I had a cruddy night."  Objective: Vitals:   04/25/19 0815 04/25/19 0853 04/25/19 1150 04/25/19 1546  BP: 116/78  104/74 112/89  Pulse:  (!) 109 (!) 103 87  Resp: (!) 32 (!) 31 (!) 25 (!) 30  Temp: 97.8 F (36.6 C)  98.7 F (37.1 C)   TempSrc: Oral  Oral   SpO2: 98% 94% 92% 97%  Weight:      Height:        Intake/Output Summary (Last 24 hours) at 04/25/2019 1549 Last data filed at 04/25/2019 1546 Gross per 24 hour  Intake 1668.68 ml  Output 200 ml  Net 1468.68 ml   Filed Weights   04/23/19 1605 04/24/19 1502 04/25/19 0629  Weight: 98.9 kg 100.7 kg 96.8 kg    Examination:  General: 49 y.o. female resting in bed in NAD Cardiovascular: RRR, +S1, S2, no m/g/r Respiratory: b/l rhales, decreased at baseline, WOB slightly increase GI: BS+, NDNT, no masses noted MSK: No e/c/c; rash in axilla and back c/w shingles  Neuro: Alert to name, follows commands Psyc: Appropriate interaction and affect, calm/cooperative   Data Reviewed: I have personally reviewed following labs and imaging studies.  CBC: Recent Labs  Lab 04/23/19 1800 04/23/19 1809 04/24/19 0610 04/25/19 0248  WBC 8.4  --  7.2 8.0  NEUTROABS 7.7  --  5.4 6.4  HGB 12.7 15.0 12.0 13.8  HCT 44.2 44.0 40.8 47.1*  MCV 86.5  --  85.7 84.9  PLT 248  --  229 409   Basic Metabolic Panel: Recent Labs  Lab 04/23/19 1800 04/23/19 1809 04/24/19 0610 04/25/19 0248  NA 138 137 141 141  K 4.2 3.9 3.4* 3.4*  CL 95*  --  99 97*  CO2 27  --  28 29  GLUCOSE 275*  --   118* 114*  BUN 21*  --  20 15  CREATININE 1.05*  --  0.77 0.86  CALCIUM 8.9  --  8.5* 8.5*  MG  --   --   --  2.2   GFR: Estimated Creatinine Clearance: 91.1 mL/min (by C-G formula based on SCr of 0.86 mg/dL). Liver Function Tests: Recent Labs  Lab 04/23/19 1800 04/24/19 0610  AST 21 15  ALT 31 28  ALKPHOS 44 37*  BILITOT 0.5 0.6  PROT 6.1* 5.3*  ALBUMIN 3.2* 2.9*   No results for input(s): LIPASE, AMYLASE in the last 168 hours. No results for input(s): AMMONIA in the last 168 hours. Coagulation Profile: No results for input(s): INR, PROTIME in the last 168 hours. Cardiac Enzymes: No results for input(s): CKTOTAL, CKMB, CKMBINDEX, TROPONINI in the last 168 hours. BNP (last 3 results) No results for input(s): PROBNP in the last 8760 hours. HbA1C: No results for input(s): HGBA1C in the last 72 hours. CBG: Recent Labs  Lab 04/24/19 2136 04/25/19 0031 04/25/19 0400 04/25/19 0808 04/25/19 1140  GLUCAP 210* 170* 86 93 177*   Lipid Profile: No results for input(s): CHOL, HDL, LDLCALC, TRIG, CHOLHDL, LDLDIRECT in the last 72 hours. Thyroid Function Tests: No results for input(s): TSH, T4TOTAL, FREET4, T3FREE, THYROIDAB in the last 72 hours. Anemia Panel: No results for input(s): VITAMINB12, FOLATE, FERRITIN, TIBC, IRON, RETICCTPCT in the last 72 hours. Sepsis Labs: Recent Labs  Lab 04/23/19 1800 04/24/19 0610  PROCALCITON  --  <0.10  LATICACIDVEN 3.4* 1.3    Recent Results (from the past 240 hour(s))  SARS CORONAVIRUS 2 (TAT 6-24 HRS) Nasopharyngeal Nasopharyngeal Swab     Status: None   Collection Time: 04/23/19  4:46 PM   Specimen: Nasopharyngeal Swab  Result Value Ref Range Status   SARS Coronavirus 2 NEGATIVE NEGATIVE Final    Comment: (NOTE) SARS-CoV-2 target nucleic acids are NOT DETECTED. The SARS-CoV-2 RNA is generally detectable in upper and lower respiratory specimens during the acute phase of infection. Negative results do not preclude SARS-CoV-2  infection, do not rule out co-infections with other pathogens, and should not be used as the sole basis for treatment or other patient management decisions. Negative results must be combined with clinical observations, patient history, and epidemiological  information. The expected result is Negative. Fact Sheet for Patients: SugarRoll.be Fact Sheet for Healthcare Providers: https://www.woods-mathews.com/ This test is not yet approved or cleared by the [place] FDA and  has been authorized for detection and/or diagnosis of SARS-CoV-2 by FDA under an Emergency Use Authorization (EUA). This EUA will remain  in effect (meaning this test can be used) for the duration of the COVID-19 declaration under Section 56 4(b)(1) of the Act, 21 U.S.C. section 360bbb-3(b)(1), unless the authorization is terminated or revoked sooner. Performed at [facility], [place] [address], [place], [place] [address]   Culture, blood (routine x 2)     Status: None (Preliminary result)   Collection Time: 04/23/19  6:00 PM   Specimen: BLOOD RIGHT ARM  Result Value Ref Range Status   Specimen Description BLOOD RIGHT ARM  Final   Special Requests   Final    BOTTLES DRAWN AEROBIC AND ANAEROBIC Blood Culture adequate volume   Culture   Final    NO GROWTH 2 DAYS Performed at [facility], [place] [address] [place], [place] [address]    Report Status PENDING  Incomplete  Culture, blood (routine x 2)     Status: None (Preliminary result)   Collection Time: 04/23/19  6:05 PM   Specimen: BLOOD RIGHT HAND  Result Value Ref Range Status   Specimen Description BLOOD RIGHT HAND  Final   Special Requests   Final    BOTTLES DRAWN AEROBIC AND ANAEROBIC Blood Culture adequate volume   Culture   Final    NO GROWTH 2 DAYS Performed at [facility], [place] [address], [place], [place] [address]    Report Status PENDING  Incomplete      Radiology Studies: DG Chest  Port 1 View  Result Date: 04/23/2019 CLINICAL DATA:  Left-sided chest pain and shortness of breath. EXAM: PORTABLE CHEST 1 VIEW COMPARISON:  February 21, 2018 FINDINGS: Mild to moderate severity bilateral infiltrates are seen involving predominantly the mid lung fields and bilateral lung bases. There is no evidence of a pleural effusion or pneumothorax. The cardiac silhouette is moderately enlarged unchanged in size. The visualized skeletal structures are unremarkable. IMPRESSION: 1. Mild to moderate severity bilateral infiltrates. 2. Stable cardiomegaly. Electronically Signed   By: Virgina Norfolk M.D.   On: 04/23/2019 17:02     Scheduled Meds: . apixaban  5 mg Oral BID  . atenolol  75 mg Oral BID  . cholecalciferol  1,000 Units Oral Daily  . furosemide  40 mg Intravenous Q8H  . gabapentin  300 mg Oral TID  . guaiFENesin  600 mg Oral Daily  . insulin aspart  0-9 Units Subcutaneous TID WC  . insulin glargine  24 Units Subcutaneous QHS  . methylPREDNISolone (SOLU-MEDROL) injection  60 mg Intravenous Q8H  . mycophenolate  1,500 mg Oral BID  . potassium chloride  40 mEq Oral Daily  . sildenafil  20 mg Oral BID  . sulfamethoxazole-trimethoprim  1 tablet Oral Once per day on Mon Wed Fri  . verapamil  120 mg Oral QHS   Continuous Infusions: . acyclovir 750 mg (04/25/19 0513)     LOS: 2 days    Time spent: 25 minutes spent in the coordination of care today.    Jonnie Finner, DO [facility]  If 7PM-7AM, please contact night-coverage [email or web address] 04/25/2019, 3:49 PM

## 2019-04-25 NOTE — Progress Notes (Signed)
Inpatient Diabetes Program Recommendations  AACE/ADA: New Consensus Statement on Inpatient Glycemic Control (2015)  Target Ranges:  Prepandial:   less than 140 mg/dL      Peak postprandial:   less than 180 mg/dL (1-2 hours)      Critically ill patients:  140 - 180 mg/dL   Lab Results  Component Value Date   GLUCAP 93 04/25/2019   HGBA1C 8.6 (H) 02/24/2019    Review of Glycemic Control Results for Felicia [name], Felicia [name] (MRN [id number]) as of 04/25/2019 10:41  Ref. Range 04/24/2019 17:34 04/24/2019 21:36 04/25/2019 00:31 04/25/2019 04:00 04/25/2019 08:08  Glucose-Capillary Latest Ref Range: 70 - 99 mg/dL 340 (H) 210 (H) 170 (H) 86 93   Diabetes history: Type 2 DM Outpatient Diabetes medications: Tresiba 24 units QD, Humalog 0-20 units BID, Metformin 1000 mg QHS, Novolog 6 units TID Current orders for Inpatient glycemic control: Lantus 24 units QHS, Novolog 0-9 units TID Prednisone 40 mg QAM Solumedrol 60 mg Q8H  Inpatient Diabetes Program Recommendations:    Anticipate glucose trends to increase with addition of Solumedrol.   If post prandials continue to exceed inpatient goals, consider adding Novolog 4 units TID (assuming patient is consuming >50% of meals).   Thanks, Bronson Curb, MSN, RNC-OB Diabetes Coordinator [phone] (8a-5p)

## 2019-04-25 NOTE — Progress Notes (Signed)
  Echocardiogram 2D Echocardiogram has been performed.  Felicia [name] 04/25/2019, 9:10 AM

## 2019-04-25 NOTE — Consult Note (Addendum)
Advanced Heart Failure Team Consult Note   Primary Physician: Aretta Nip, MD PCP-Cardiologist:  No primary care provider on file.  PAH MD: Dr [name] Pulmonary: [name]  Reason for Consultation: Pulmonary Hypertension   HPI:    Felicia [name] is seen today for evaluation of pulmonary hypertension at the request of Dr Tamala Julian.   Felicia [name] is Felicia 49 year old with history of NSIP since 2010 and has been on chronic prednisione, PAH, SVT, chronic respiratory failure on oxygen on 12 liters at rest and 15 liters exertion, DM, DVT 01/2019 on eliquis, and HTN.   Followed closely at [facility] by Dr [name] for [name]. Has been treated with revatio and tyvaso. Most recent cath 04/2017 at [facility] Felicia [name] 8, Felicia [name] 56/28 (38), PCWP 12, CO 4.2, CI 2, and PVR 6.3.Rheumatologic workup back in 2011 at the time of their diagnosis while Felicia [name] was negative except for mildly elevated aldolase. Anti-Jo, RNP, SM, rheumatoid factor, CCP, double-stranded DNA, ANA etc. were all normal   Admitted to [name] in January of this year with increased dyspnea. HRCT was c/w chronic fibrotic ILD. Placed on steroids,  IV rituximab + tyvaso.   Prior to admit she developed increased shortness of breath and leg edema. She had been taking lasix 40 mg twice Felicia day.  Says she had been using Tyvaso but she no longer thinks it is working. No recent lightheaded or syncopal episode.  Taking all medications.   Presented to [facility] with increased SOB. Says she is intolerant tyvaso due to cough. On admit she was on 15 liters HFNC.  CXR with bilateral infiltrates.Pertinent admission labs included: WBC 7.2, Hgb 12, 0.77, K 3.4. Pulmonary consulted with recommendations for limited echo with bubble, and IV diuresis.   Complaining of pain on the left from shingles. Remains SOB with exertion.   Echo 02/2018 EF 55%    Review of Systems: [y] = yes, _0  = no   . General: Weight gain _1 ; Weight loss _2 ; Anorexia _3 ; Fatigue [ Y]; Fever _4 ; Chills _5 ;  Weakness _6   . Cardiac: Chest pain/pressure _7 ; Resting SOB Y]; Exertional SOB [Y ]; Orthopnea [Y ]; Pedal Edema [ Y]; Palpitations _8 ; Syncope _9 ; Presyncope _10 ; Paroxysmal nocturnal dyspnea_11   . Pulmonary: Cough _12 ; Wheezing_13 ; Hemoptysis_14 ; Sputum _15 ; Snoring _16   . GI: Vomiting_17 ; Dysphagia_18 ; Melena_19 ; Hematochezia _20 ; Heartburn_21 ; Abdominal pain _22 ; Constipation _23 ; Diarrhea _24 ; BRBPR _25   . GU: Hematuria_26 ; Dysuria _27 ; Nocturia_28   . Vascular: Pain in legs with walking _29 ; Pain in feet with lying flat _30 ; Non-healing sores _31 ; Stroke _32 ; TIA _33 ; Slurred speech _34 ;  . Neuro: Headaches_35 ; Vertigo_36 ; Seizures_37 ; Paresthesias_38 ;Blurred vision _39 ; Diplopia _40 ; Vision changes _41   . Ortho/Skin: Arthritis _42 ; Joint pain [Y]; Muscle pain _43 ; Joint swelling _44 ; Back Pain _45 ; Rash _46   . Psych: Depression[Y ]; Anxiety_47   . Heme: Bleeding problems _48 ; Clotting disorders _49 ; Anemia _50   . Endocrine: Diabetes [Y ]; Thyroid dysfunction_51   Home Medications Prior to Admission medications   Medication Sig Start Date End Date Taking? Authorizing Provider  acetaminophen (TYLENOL) 500 MG tablet Take 2,000 mg by mouth as needed for moderate pain.    Yes [provider]  albuterol (PROVENTIL  HFA;VENTOLIN HFA) 108 (90 Base) MCG/ACT inhaler Inhale 1 puff into the lungs every 6 (six) hours as needed for wheezing or shortness of breath.   Yes [provider]  apixaban (ELIQUIS) 5 MG TABS tablet Take 1 tablet (5 mg total) by mouth 2 (two) times daily. 03/12/19 04/23/19 Yes Olalere, Adewale A, MD  atenolol (TENORMIN) 50 MG tablet Take 75 mg by mouth 2 (two) times daily.  01/08/17  Yes [provider]  cholecalciferol (VITAMIN D3) 25 MCG (1000 UT) tablet Take 1,000 Units by mouth daily.   Yes [provider]  furosemide (LASIX) 40 MG tablet Take 1 tablet (40 mg total) by mouth 2 (two) times daily. 03/31/18  Yes [place] Eric J, DO  guaiFENesin  (MUCINEX) 600 MG 12 hr tablet Take 600 mg by mouth daily.    Yes [provider]  insulin degludec (TRESIBA FLEXTOUCH) 100 UNIT/ML FlexTouch Pen Inject 24 Units into the skin daily.   Yes [provider]  insulin lispro (HUMALOG KWIKPEN) 100 UNIT/ML KwikPen Inject 0-20 Units into the skin 2 (two) times daily. Sliding scale based on sugar level   Yes [provider]  ipratropium-albuterol (DUONEB) 0.5-2.5 (3) MG/3ML SOLN Inhale 3 mLs into the lungs every 6 (six) hours as needed (sob and wheezing).  01/08/17  Yes [provider]  metFORMIN (GLUCOPHAGE-XR) 500 MG 24 hr tablet Take 1,000 mg by mouth at bedtime.  04/10/19  Yes [provider]  mycophenolate (CELLCEPT) 500 MG tablet Take 1,500 mg by mouth 2 (two) times daily.    Yes [provider]  naproxen sodium (ALEVE) 220 MG tablet Take 440 mg by mouth as needed (pain).   Yes [provider]  predniSONE (DELTASONE) 10 MG tablet Take 40 mg by mouth daily with breakfast.   Yes [provider]  sildenafil (REVATIO) 20 MG tablet Take 20 mg by mouth 2 (two) times daily. 04/15/19  Yes [provider]  sulfamethoxazole-trimethoprim (BACTRIM DS) 800-160 MG tablet Take 1 tablet by mouth 3 (three) times Felicia week. 12/24/18  Yes [provider]  Treprostinil (TYVASO REFILL) 0.6 MG/ML SOLN Inhale 18 mcg into the lungs every 6 (six) hours.   Yes [provider]  verapamil (CALAN-SR) 120 MG CR tablet Take 1 tablet (120 mg total) by mouth at bedtime. 09/23/14  Yes Etta Quill, NP  insulin aspart (NOVOLOG) 100 UNIT/ML injection Inject 6 Units into the skin 3 (three) times daily with meals. Patient not taking: Reported on 04/23/2019 03/03/19   Marshell Garfinkel, MD  insulin detemir (LEVEMIR) 100 UNIT/ML injection Inject 0.1 mLs (10 Units total) into the skin 2 (two) times daily. Patient not taking: Reported on 04/23/2019 03/03/19   Marshell Garfinkel, MD    Past Medical History: Past  Medical History:  Diagnosis Date  . Diabetes mellitus without complication ([place])    type 2   . Hypertension   . NSIP (nonspecific interstitial pneumonitis) ([place])   . SVT (supraventricular tachycardia) (H[facility])     Past Surgical History: Past Surgical History:  Procedure Laterality Date  . CESAREAN SECTION    . LUNG BIOPSY      Family History: Family History  Problem Relation Age of Onset  . Hypertension Mother   . Cancer Father   . Diabetes Father   . Hypertension Father   . Diabetes Sister   . Mental retardation Sister   . Cancer Maternal Grandmother   . Cancer Maternal Grandfather   . Cancer Paternal Grandmother   .  Cancer Paternal Grandfather     Social History: Social History   Socioeconomic History  . Marital status: Single    Spouse name: Not on file  . Number of children: Not on file  . Years of education: Not on file  . Highest education level: Not on file  Occupational History  . Not on file  Tobacco Use  . Smoking status: Never Smoker  . Smokeless tobacco: Never Used  . Tobacco comment: never smoked or used tobacco products  Substance and Sexual Activity  . Alcohol use: No  . Drug use: No  . Sexual activity: Not on file  Other Topics Concern  . Not on file  Social History Narrative   One daugther.  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     Social Determinants of Health   Financial Resource Strain:   . Difficulty of Paying Living Expenses:   Food Insecurity:   . Worried About Charity fundraiser in the Last Year:   . Arboriculturist in the Last Year:   Transportation Needs:   . Film/video editor (Medical):   [name] Lack of Transportation (Non-Medical):   Physical Activity:   . Days of Exercise per Week:   . Minutes of Exercise per Session:   Stress:   . Feeling of Stress :   Social Connections:   . Frequency of Communication with Friends and Family:   . Frequency of Social Gatherings with Friends and Family:   . Attends Religious Services:   .  Active Member of Clubs or Organizations:   . Attends Archivist Meetings:   [name] Marital Status:     Allergies:  No Known Allergies  Objective:    Vital Signs:   Temp:  [97.8 F (36.6 C)-98.8 F (37.1 C)] 97.8 F (36.6 C) (03/18 0815) Pulse Rate:  [75-109] 109 (03/18 0853) Resp:  [17-32] 31 (03/18 0853) BP: (109-132)/(77-93) 116/78 (03/18 0815) SpO2:  [91 %-99 %] 94 % (03/18 0853) Weight:  [96.8 kg-100.7 kg] 96.8 kg (03/18 0629) Last BM Date: 04/22/19  Weight change: Filed Weights   04/23/19 1605 04/24/19 1502 04/25/19 0629  Weight: 98.9 kg 100.7 kg 96.8 kg    Intake/Output:   Intake/Output Summary (Last 24 hours) at 04/25/2019 1127 Last data filed at 04/25/2019 0600 Gross per 24 hour  Intake 1206.68 ml  Output --  Net 1206.68 ml      Physical Exam    General:  Cushingoid appearance  HEENT: normal Neck: supple. JVP difficult to assess due to body habitus . Carotids 2+ bilat; no bruits. No lymphadenopathy or thyromegaly appreciated. Cor: PMI nondisplaced. Regular rate & rhythm. No rubs, gallops or murmurs. Lungs: clear on 15 liters [place]  Abdomen: soft, nontender, nondistended. No hepatosplenomegaly. No bruits or masses. Good bowel sounds. Extremities: no cyanosis, clubbing, rash, edema Neuro: alert & orientedx3, cranial nerves grossly intact. moves all 4 extremities w/o difficulty. Affect pleasant   Telemetry   SR 80-90s   EKG    SR 82 bpm   Labs   Basic Metabolic Panel: Recent Labs  Lab 04/23/19 1800 04/23/19 1809 04/24/19 0610 04/25/19 0248  NA 138 137 141 141  K 4.2 3.9 3.4* 3.4*  CL 95*  --  99 97*  CO2 27  --  28 29  GLUCOSE 275*  --  118* 114*  BUN 21*  --  20 15  CREATININE 1.05*  --  0.77 0.86  CALCIUM 8.9  --  8.5* 8.5*  MG  --   --   --  2.2    Liver Function Tests: Recent Labs  Lab 04/23/19 1800 04/24/19 0610  AST 21 15  ALT 31 28  ALKPHOS 44 37*  BILITOT 0.5 0.6  PROT 6.1* 5.3*  ALBUMIN 3.2* 2.9*   No results for  input(s): LIPASE, AMYLASE in the last 168 hours. No results for input(s): AMMONIA in the last 168 hours.  CBC: Recent Labs  Lab 04/23/19 1800 04/23/19 1809 04/24/19 0610 04/25/19 0248  WBC 8.4  --  7.2 8.0  NEUTROABS 7.7  --  5.4 6.4  HGB 12.7 15.0 12.0 13.8  HCT 44.2 44.0 40.8 47.1*  MCV 86.5  --  85.7 84.9  PLT 248  --  229 211    Cardiac Enzymes: No results for input(s): CKTOTAL, CKMB, CKMBINDEX, TROPONINI in the last 168 hours.  BNP: BNP (last 3 results) Recent Labs    02/03/19 2336 02/22/19 2321 04/24/19 0530  BNP 718.1* 612.0* 1,330.3*    ProBNP (last 3 results) No results for input(s): PROBNP in the last 8760 hours.   CBG: Recent Labs  Lab 04/24/19 1734 04/24/19 2136 04/25/19 0031 04/25/19 0400 04/25/19 0808  GLUCAP 340* 210* 170* 86 93    Coagulation Studies: No results for input(s): LABPROT, INR in the last 72 hours.   Imaging    No results found.   Medications:     Current Medications: . apixaban  5 mg Oral BID  . atenolol  75 mg Oral BID  . cholecalciferol  1,000 Units Oral Daily  . furosemide  40 mg Intravenous Q8H  . gabapentin  300 mg Oral TID  . guaiFENesin  600 mg Oral Daily  . insulin aspart  0-9 Units Subcutaneous TID WC  . insulin glargine  24 Units Subcutaneous QHS  . methylPREDNISolone (SOLU-MEDROL) injection  60 mg Intravenous Q8H  . mycophenolate  1,500 mg Oral BID  . potassium chloride  40 mEq Oral Daily  . sildenafil  20 mg Oral BID  . sulfamethoxazole-trimethoprim  1 tablet Oral Once per day on Mon Wed Fri  . verapamil  120 mg Oral QHS     Infusions: . acyclovir 750 mg (04/25/19 0513)       Assessment/Plan   1. Acute/Chronic Respiratory Failure, multifactorial with PAH, NSIP  On 15 HFNC with stable O2 sats. At home she is on 12 liters at rest and 15 liters with exertion.   2. PAH Dating back 10 years . WHO Group 1 and 3. Remains on HFNC Has been on combination therapy - sildenafil + Tyvaso.  Stopped  using Tyvaso due to cough. ? Cough is related to volume overload.  Last RHC was back in 2019 PVR > 6.  - May need repeat RHC.  - If intolerant tyvaso will need to discuss with Dr [name].   3. NSIP  Last received rituximab 02/2019  On steroids + cellcept  4. Felicia/C Diastolic HF  -ECHO 9937 EF 55%. Repeat ECHO today.  -Overloaded on admit but looks euvolemic today. Give one more dose of IV lasix.  - Renal function stable.   5. H/O DVT 2020 On elqiuis 5 mg twice Felicia day.   6. [place] On airborne precautions.  7. H/O SVT On verapamil     Length of Stay: 2  Amy Clegg, NP  04/25/2019, 11:27 AM  Advanced Heart Failure Team Pager [phone] (M-F; 7a - 4p)  Please contact [facility] for night-coverage after hours (4p -7a ) and weekends on [email or web address]  Patient seen with NP, agree  with the above note.  She has ILD due to presumed NSIP and has been on Cellcept and prednisone at home as well as rituximab infusions. Last HRCT chest showed chronic fibrotic lung disease.  She has been followed at [facility] for ILD as well as pulmonary hypertension.  She is thought to have Felicia combination of group 1 and group 3 PH.  She has been on sildenafil 20 mg tid and also Tyvaso (used because of ILD/parenchymal lung disease). She does not like taking Tyvaso because it makes her cough and wants to stop it.   She was admitted with increased dyspnea.  CXR with bilateral infiltrates, BNP elevated at 1330.  She was started on Solumedrol as well as IV Lasix, she was continued on Cellcept.  She is on 15 L HFNC at home with exertion, has been on 15 L here.  I/Os have been poorly recorded but her weight is down.  She feels better.   Echo was done today and reviewed.  LV EF normal with Felicia [name]-shaped interventricular septum and moderately dilated/moderate-severely dysfunctional RV, PASP 66 mmHg estimated.  The IVC was small/collapsed. Negative bubble study.   General: NAD, cushingoid.  Neck: Thick, JVP difficult, no thyromegaly or  thyroid nodule.  Lungs: Dry crackles dependently.  CV: Nondisplaced PMI.  Heart regular S1/S2, no S3/S4, no murmur.  No peripheral edema.  No carotid bruit.  Normal pedal pulses.  Abdomen: Soft, nontender, no hepatosplenomegaly, no distention.  Skin: Intact without lesions or rashes.  Neurologic: Alert and oriented x 3.  Psych: Normal affect. Extremities: No clubbing or cyanosis.  HEENT: Normal.   1. Acute on chronic hypoxemic respiratory failure: Multifactorial.  Has ILD/NSIP with possible flare as well as pulmonary hypertension/RV failure.  At baseline, on 10-15 L HFNC.   She has felt better with diuresis and IV Solumedrol.  - Continue treatment of ILD flare per pulmonary, on Solumedrol.  - She is on Lasix 40 mg IV every 8 hrs => exam difficult for volume but IVC not dilated on echo, think she is nearing adequate diuresis.  Would probably stop IV Lasix after today.  2. Pulmonary hypertension: She has been followed at [facility], thought to have mixed group 1 and group 3 (ILD/NSIP) PH.  She has been on sildenafil and Tyvaso.  INCREASE study would suggest benefit for Tyvaso in patients like her.  She does not like Tyvaso because it seems to trigger coughing, and she wants to stop it.  Echo shows Felicia [name]-shaped interventricular septum with moderately dilated and mod-severely dysfunctional RV with estimated PASP 66 mmHg, bubble study negative.  The IVC is not dilated.   - Continue sildenafil.  - I talked with her about restarting Tyvaso for now.  I do not want her to get rebound elevation in her Felicia [name] pressure and we do not have many options for getting her on additional therapy acutely.  She agrees to restart Tyvaso (may do better with it now that she is diuresed).  - Will plan RHC tomorrow to formally assess filling pressures, Felicia [name] pressure, and cardiac output. I discussed risks/benefits of procedure with her and she agrees.  - Ultimately, will need to get her back to her [place] MD at [name] after discharge for possible  therapy adjustment.  3. RV failure: She appears by echo (exam is difficult) to be well-diuresed at this point.  - Would stop IV Lasix after today.  - RHC tomorrow for formal evaluation.   4. Shingles: Now scabbed over, she can come off precautions.  5. H/o SVT: On verapamil and atenolol, quiescent.  6. H/o DVT in 2020: On Eliquis 5 mg bid. Hold tomorrow for cath.  7. ILD: NSIP.  She is on steroids + Cellcept at home, followed at [facility] as outpatient.  She had last Rituximab infusion in 1/21.   Loralie Champagne 04/25/2019 4:36 PM  .

## 2019-04-26 ENCOUNTER — Encounter (HOSPITAL_COMMUNITY)
Admission: EM | Disposition: A | Discharge status: Home / Self Care | Payer: Self-pay | Source: Home / Self Care | Attending: Internal Medicine

## 2019-04-26 HISTORY — PX: RIGHT HEART CATH: CATH118263

## 2019-04-26 LAB — GLUCOSE, CAPILLARY
Glucose-Capillary: 317 mg/dL — ABNORMAL HIGH (ref 70–99)
Glucose-Capillary: 271 mg/dL — ABNORMAL HIGH (ref 70–99)
Glucose-Capillary: 218 mg/dL — ABNORMAL HIGH (ref 70–99)
Glucose-Capillary: 190 mg/dL — ABNORMAL HIGH (ref 70–99)
Glucose-Capillary: 293 mg/dL — ABNORMAL HIGH (ref 70–99)

## 2019-04-26 LAB — CBC WITH DIFFERENTIAL/PLATELET
Abs Immature Granulocytes: 0.06 10*3/uL (ref 0.00–0.07)
Basophils Absolute: 0 10*3/uL (ref 0.0–0.1)
Basophils Relative: 0 %
Eosinophils Absolute: 0 10*3/uL (ref 0.0–0.5)
Eosinophils Relative: 0 %
HCT: 43.2 % (ref 36.0–46.0)
Hemoglobin: 12.8 g/dL (ref 12.0–15.0)
Immature Granulocytes: 1 %
Lymphocytes Relative: 5 %
Lymphs Abs: 0.3 10*3/uL — ABNORMAL LOW (ref 0.7–4.0)
MCH: 24.5 pg — ABNORMAL LOW (ref 26.0–34.0)
MCHC: 29.6 g/dL — ABNORMAL LOW (ref 30.0–36.0)
MCV: 82.6 fL (ref 80.0–100.0)
Monocytes Absolute: 0.2 10*3/uL (ref 0.1–1.0)
Monocytes Relative: 4 %
Neutro Abs: 5.5 10*3/uL (ref 1.7–7.7)
Neutrophils Relative %: 90 %
Platelets: 212 10*3/uL (ref 150–400)
RBC: 5.23 MIL/uL — ABNORMAL HIGH (ref 3.87–5.11)
RDW: 19.5 % — ABNORMAL HIGH (ref 11.5–15.5)
WBC: 6.1 10*3/uL (ref 4.0–10.5)
nRBC: 0.3 % — ABNORMAL HIGH (ref 0.0–0.2)

## 2019-04-26 LAB — BASIC METABOLIC PANEL
Anion gap: 16 — ABNORMAL HIGH (ref 5–15)
BUN: 23 mg/dL — ABNORMAL HIGH (ref 6–20)
CO2: 28 mmol/L (ref 22–32)
Calcium: 8.7 mg/dL — ABNORMAL LOW (ref 8.9–10.3)
Chloride: 93 mmol/L — ABNORMAL LOW (ref 98–111)
Creatinine, Ser: 1.07 mg/dL — ABNORMAL HIGH (ref 0.44–1.00)
GFR calc Af Amer: >60 mL/min (ref >60)
GFR calc non Af Amer: >60 mL/min (ref >60)
Glucose, Bld: 309 mg/dL — ABNORMAL HIGH (ref 70–99)
Potassium: 3.7 mmol/L (ref 3.5–5.1)
Sodium: 137 mmol/L (ref 135–145)

## 2019-04-26 LAB — POCT I-STAT EG7
Acid-Base Excess: 11 mmol/L — ABNORMAL HIGH (ref 0.0–2.0)
Acid-Base Excess: 12 mmol/L — ABNORMAL HIGH (ref 0.0–2.0)
Acid-Base Excess: 9 mmol/L — ABNORMAL HIGH (ref 0.0–2.0)
Bicarbonate: 36 mmol/L — ABNORMAL HIGH (ref 20.0–28.0)
Bicarbonate: 37.4 mmol/L — ABNORMAL HIGH (ref 20.0–28.0)
Bicarbonate: 38.7 mmol/L — ABNORMAL HIGH (ref 20.0–28.0)
Calcium, Ion: 1.16 mmol/L (ref 1.15–1.40)
Calcium, Ion: 1.19 mmol/L (ref 1.15–1.40)
Calcium, Ion: 1.2 mmol/L (ref 1.15–1.40)
HCT: 44 % (ref 36.0–46.0)
HCT: 44 % (ref 36.0–46.0)
HCT: 44 % (ref 36.0–46.0)
Hemoglobin: 15 g/dL (ref 12.0–15.0)
Hemoglobin: 15 g/dL (ref 12.0–15.0)
Hemoglobin: 15 g/dL (ref 12.0–15.0)
O2 Saturation: 60 %
O2 Saturation: 61 %
O2 Saturation: 61 %
Potassium: 3.7 mmol/L (ref 3.5–5.1)
Potassium: 3.8 mmol/L (ref 3.5–5.1)
Potassium: 3.9 mmol/L (ref 3.5–5.1)
Sodium: 137 mmol/L (ref 135–145)
Sodium: 138 mmol/L (ref 135–145)
Sodium: 138 mmol/L (ref 135–145)
TCO2: 38 mmol/L — ABNORMAL HIGH (ref 22–32)
TCO2: 39 mmol/L — ABNORMAL HIGH (ref 22–32)
TCO2: 40 mmol/L — ABNORMAL HIGH (ref 22–32)
pCO2, Ven: 55.5 mmHg (ref 44.0–60.0)
pCO2, Ven: 55.5 mmHg (ref 44.0–60.0)
pCO2, Ven: 56.5 mmHg (ref 44.0–60.0)
pH, Ven: 7.42 (ref 7.250–7.430)
pH, Ven: 7.428 (ref 7.250–7.430)
pH, Ven: 7.451 — ABNORMAL HIGH (ref 7.250–7.430)
pO2, Ven: 31 mmHg — CL (ref 32.0–45.0)
pO2, Ven: 32 mmHg (ref 32.0–45.0)
pO2, Ven: 32 mmHg (ref 32.0–45.0)

## 2019-04-26 LAB — MAGNESIUM: Magnesium: 1.9 mg/dL (ref 1.7–2.4)

## 2019-04-26 LAB — MRSA PCR SCREENING: MRSA by PCR: NEGATIVE

## 2019-04-26 LAB — HCG, SERUM, QUALITATIVE: Preg, Serum: NEGATIVE

## 2019-04-26 SURGERY — RIGHT HEART CATH
Anesthesia: LOCAL

## 2019-04-26 MED ORDER — LIDOCAINE HCL (PF) 1 % IJ SOLN
INTRAMUSCULAR | Status: DC | PRN
  Administered 2019-04-26: 2 mL

## 2019-04-26 MED ORDER — ASPIRIN 81 MG PO CHEW
81.0000 mg | CHEWABLE_TABLET | ORAL | Status: AC
  Administered 2019-04-26: 81 mg via ORAL
  Filled 2019-04-26: qty 1

## 2019-04-26 MED ORDER — SODIUM CHLORIDE 0.9% FLUSH: 3.0000 mL | INTRAVENOUS | Status: DC | PRN

## 2019-04-26 MED ORDER — HEPARIN (PORCINE) IN NACL 1000-0.9 UT/500ML-% IV SOLN
INTRAVENOUS | Status: AC
  Filled 2019-04-26: qty 500

## 2019-04-26 MED ORDER — SODIUM CHLORIDE 0.9 % IV SOLN
INTRAVENOUS | Status: AC | PRN
  Administered 2019-04-26: 10 mL/h via INTRAVENOUS

## 2019-04-26 MED ORDER — SODIUM CHLORIDE 0.9 % IV SOLN: 250.0000 mL | INTRAVENOUS | Status: DC | PRN

## 2019-04-26 MED ORDER — LIDOCAINE 4 % EX CREA
1.0000 "application " | TOPICAL_CREAM | Freq: Two times a day (BID) | CUTANEOUS | Status: DC | PRN
  Administered 2019-04-26 – 2019-04-27 (×2): 1 via TOPICAL
  Filled 2019-04-26: qty 5

## 2019-04-26 MED ORDER — CHLORHEXIDINE GLUCONATE CLOTH 2 % EX PADS
6.0000 | MEDICATED_PAD | Freq: Every day | CUTANEOUS | Status: DC
  Administered 2019-04-26 – 2019-04-28 (×3): 6 via TOPICAL

## 2019-04-26 MED ORDER — LIDOCAINE HCL (PF) 1 % IJ SOLN
INTRAMUSCULAR | Status: AC
  Filled 2019-04-26: qty 30

## 2019-04-26 MED ORDER — INSULIN ASPART 100 UNIT/ML ~~LOC~~ SOLN
4.0000 [IU] | Freq: Three times a day (TID) | SUBCUTANEOUS | Status: DC
  Administered 2019-04-27 – 2019-04-28 (×4): 4 [IU] via SUBCUTANEOUS

## 2019-04-26 MED ORDER — SODIUM CHLORIDE 0.9 % IV SOLN: INTRAVENOUS | Status: DC

## 2019-04-26 MED ORDER — HEPARIN (PORCINE) IN NACL 1000-0.9 UT/500ML-% IV SOLN
INTRAVENOUS | Status: DC | PRN
  Administered 2019-04-26: 500 mL

## 2019-04-26 SURGICAL SUPPLY — 8 items
CATH BALLN WEDGE 5F 110CM (CATHETERS) ×1 IMPLANT
HOVERMATT SINGLE USE (MISCELLANEOUS) ×1 IMPLANT
KIT HEART LEFT (KITS) ×2 IMPLANT
PACK CARDIAC CATHETERIZATION (CUSTOM PROCEDURE TRAY) ×2 IMPLANT
SHEATH GLIDE SLENDER 4/5FR (SHEATH) ×1 IMPLANT
TRANSDUCER W/STOPCOCK (MISCELLANEOUS) ×2 IMPLANT
TUBING ART PRESS 72  MALE/FEM (TUBING) ×1
TUBING ART PRESS 72 MALE/FEM (TUBING) IMPLANT

## 2019-04-26 NOTE — Progress Notes (Signed)
NAME:  Felicia [name], MRN:  [id number], DOB:  02/08/1970, LOS: 3 ADMISSION DATE:  04/23/2019, CONSULTATION DATE:  04/24/19 REFERRING MD:  Marylyn Ishihara, CHIEF COMPLAINT:  Dyspnea   Brief History   49 year old woman with O2-dependent NSIP, groups 1/3 pulmonary HTN on revatio and tyvaso presenting with acute on chronic hypoxemia.  History of present illness   49 year old woman with O2-dependent NSIP, groups 1/3 pulmonary HTN on revatio and tyvaso presenting with acute on chronic hypoxemia.  Most of her management is at [facility]. She is managed locally for acute issues by Dr. [name].  For her NSIP she is on prednisone 51m/day, cellcept and bactrim ppx.  She comes in with worsening breathing over the past week.  She has a dry cough, not significantly changed.  She may have some leg swelling that she was recently started on lasix to treat.  She has had no fevers.  She is on 15L, usually on 10-15L, she does feel like this is not enough however.  She has some chest tightness worse with deep inspiration.  She also has a shingles infection along her left chest.  Past Medical History  NSIP HTN DM  Significant Hospital Events   3/16 admitted  Consults:  CHF  Procedures:   RHC Hemodynamics (mmHg) RA mean 7 RV 60/12 PA 60/24, mean 39 PCWP mean 10 Oxygen saturations: SVC 60% PA 61% AO 100% Cardiac Output (Fick) 3.89  Cardiac Index (Fick) 1.91 PVR 7.45 WU  Significant Diagnostic Tests:  CXR personally reviewed, stable severe bilateral airspace disease  Micro Data:  COVID neg  Antimicrobials:  Acyclovir   Interim history/subjective:  Tolerated tyvaso better. RHC shows euvolemia fortunately. She is feeling close to baseline but nervous about going home, wants a day or two to make sure stable.  Objective   Blood pressure 107/73, pulse 78, temperature 98.1 F (36.7 C), temperature source Oral, resp. rate (!) 26, height _0  (1.651 m), weight 99.5 kg, SpO2 97 %.         Intake/Output Summary (Last 24 hours) at 04/26/2019 1639 Last data filed at 04/26/2019 0327 Gross per 24 hour  Intake --  Output 600 ml  Net -600 ml   Filed Weights   04/24/19 1502 04/25/19 0629 04/26/19 0411  Weight: 100.7 kg 96.8 kg 99.5 kg    Examination: General: Cushingnoid woman in NAD HENT: MMM, no thrush Lungs: Scattered crackles bilaterally, mildly tachypneic Cardiovascular: RRR, pronounced P2, less cyanosis Abdomen: Soft, +BS Extremities: edema improved Neuro: moves all 4 ext Skin: left vesicular rash along chest wall with vesicular appearance, scabbed over  Resolved Hospital Problem list   N/A  Assessment & Plan:   A: Acute on chronic hypoxemic respiratory failure in patient with (A) advanced NSIP question in flare PFT Sept 2019 Ratio 0.96, FEV1 1.18L (41% pred), FVC 1.24L (35% pred), DLCO 19% pred Not transplant candidate due to weight at present (needs BMI < 30)  (B) groups 1/3 pulmonary HTN;  05/05/17 RHC PAmean 38, Wedge 12, CI 2.0, PVR 6.3 04/26/18 RHC PAmean 39, Wedge 10, CI 1.9, PVR 7.45  (C) likely fluid overloaded state of heart: resolved  P: - Appreciate Dr. MClaris Gladdenhelp: switch to PO diuretics - Continue PTA vasodilators (tyvaso, modified revatio) and immunosuppressants (cellcept) with bactrim ppx - IV steroids today, potential switch back to home dosing tomorrow - [name] - Will check on tomorrow, wean O2 to maintain sats > 90%  Erskine Emery MD [facility] 04/26/2019 4:39 PM Personal pager: [phone] If unanswered, please page CCM On-call: [phone]

## 2019-04-26 NOTE — Progress Notes (Signed)
Advanced Heart Failure Rounding Note  PCP-Cardiologist: No primary care provider on file.   Subjective:    Patient is gradually feeling better, thinks she is back to her baseline. Weights not accurate, have been done in the bed.   She started back on Tyvaso last night and did ok with it.  Has not had a dose today yet.   RHC Procedural Findings: Hemodynamics (mmHg) RA mean 7 RV 60/12 PA 60/24, mean 39 PCWP mean 10 Oxygen saturations: SVC 60% PA 61% AO 100% Cardiac Output (Fick) 3.89  Cardiac Index (Fick) 1.91 PVR 7.45 WU   Objective:   Weight Range: 99.5 kg Body mass index is 36.5 kg/m.   Vital Signs:   Temp:  [97.8 F (36.6 C)-98.4 F (36.9 C)] 98 F (36.7 C) (03/19 1213) Pulse Rate:  [25-101] 69 (03/19 1330) Resp:  [17-32] 18 (03/19 1330) BP: (105-158)/(61-95) 129/88 (03/19 1330) SpO2:  [96 %-100 %] 98 % (03/19 1330) Weight:  [99.5 kg] 99.5 kg (03/19 0411) Last BM Date: 04/22/19  Weight change: Filed Weights   04/24/19 1502 04/25/19 0629 04/26/19 0411  Weight: 100.7 kg 96.8 kg 99.5 kg    Intake/Output:   Intake/Output Summary (Last 24 hours) at 04/26/2019 1337 Last data filed at 04/26/2019 0327 Gross per 24 hour  Intake --  Output 800 ml  Net -800 ml      Physical Exam    General:  Well appearing. No resp difficulty HEENT: Normal Neck: Thick, JVP difficult. Carotids 2+ bilat; no bruits. No lymphadenopathy or thyromegaly appreciated. Cor: PMI nondisplaced. Regular rate & rhythm. No rubs, gallops or murmurs. Lungs: Crackles at bases.  Abdomen: Soft, nontender, nondistended. No hepatosplenomegaly. No bruits or masses. Good bowel sounds. Extremities: No cyanosis, clubbing, rash, edema Neuro: Alert & orientedx3, cranial nerves grossly intact. moves all 4 extremities w/o difficulty. Affect pleasant   Telemetry   NSR with PACs (personally reviewed)  Labs    CBC Recent Labs    04/25/19 0248 04/26/19 0254  WBC 8.0 6.1  NEUTROABS 6.4 5.5   HGB 13.8 12.8  HCT 47.1* 43.2  MCV 84.9 82.6  PLT 211 947   Basic Metabolic Panel Recent Labs    04/25/19 0248 04/26/19 0254  NA 141 137  K 3.4* 3.7  CL 97* 93*  CO2 29 28  GLUCOSE 114* 309*  BUN 15 23*  CREATININE 0.86 1.07*  CALCIUM 8.5* 8.7*  MG 2.2 1.9   Liver Function Tests Recent Labs    04/23/19 1800 04/24/19 0610  AST 21 15  ALT 31 28  ALKPHOS 44 37*  BILITOT 0.5 0.6  PROT 6.1* 5.3*  ALBUMIN 3.2* 2.9*   No results for input(s): LIPASE, AMYLASE in the last 72 hours. Cardiac Enzymes No results for input(s): CKTOTAL, CKMB, CKMBINDEX, TROPONINI in the last 72 hours.  BNP: BNP (last 3 results) Recent Labs    02/03/19 2336 02/22/19 2321 04/24/19 0530  BNP 718.1* 612.0* 1,330.3*    ProBNP (last 3 results) No results for input(s): PROBNP in the last 8760 hours.   D-Dimer Recent Labs    04/23/19 2200  DDIMER <0.27   Hemoglobin A1C No results for input(s): HGBA1C in the last 72 hours. Fasting Lipid Panel No results for input(s): CHOL, HDL, LDLCALC, TRIG, CHOLHDL, LDLDIRECT in the last 72 hours. Thyroid Function Tests No results for input(s): TSH, T4TOTAL, T3FREE, THYROIDAB in the last 72 hours.  Invalid input(s): FREET3  Other results:   Imaging    CARDIAC CATHETERIZATION  Result Date: 04/26/2019 1. Filling pressures (PCWP, RAP) optimized. 2. Significant PAH with PVR 7.45 WU and low CI at 1.9.      Medications:     Scheduled Medications: . [MAR Hold] apixaban  5 mg Oral BID  . [MAR Hold] atenolol  75 mg Oral BID  . [MAR Hold] Chlorhexidine Gluconate Cloth  6 each Topical Daily  . [MAR Hold] cholecalciferol  1,000 Units Oral Daily  . [MAR Hold] gabapentin  300 mg Oral TID  . [MAR Hold] guaiFENesin  600 mg Oral Daily  . [MAR Hold] insulin aspart  0-9 Units Subcutaneous TID WC  . [MAR Hold] insulin glargine  24 Units Subcutaneous QHS  . [MAR Hold] methylPREDNISolone (SOLU-MEDROL) injection  60 mg Intravenous Q8H  . [MAR Hold]  mycophenolate  1,500 mg Oral BID  . [MAR Hold] potassium chloride  40 mEq Oral Daily  . [MAR Hold] sildenafil  20 mg Oral BID  . [MAR Hold] sodium chloride flush  3 mL Intravenous Q12H  . [MAR Hold] sulfamethoxazole-trimethoprim  1 tablet Oral Once per day on Mon Wed Fri  . [MAR Hold] verapamil  120 mg Oral QHS     Infusions: . sodium chloride    . sodium chloride Stopped (04/26/19 1329)  . [MAR Hold] acyclovir 750 mg (04/26/19 0554)     PRN Medications:  sodium chloride, [MAR Hold] acetaminophen **OR** [MAR Hold] acetaminophen, [MAR Hold] albuterol, [MAR Hold] ondansetron **OR** [MAR Hold] ondansetron (ZOFRAN) IV, sodium chloride flush   Assessment/Plan   1. Acute on chronic hypoxemic respiratory failure: Multifactorial.  Has ILD/NSIP with possible flare as well as pulmonary hypertension/RV failure.  At baseline, on 10-15 L HFNC.   She has felt better with diuresis and IV Solumedrol.  - Continue treatment of ILD flare per pulmonary, on Solumedrol.  - RHC today showed optimized filling pressure, would start back on Lasix 60 mg po bid tonight morning.  2. Pulmonary hypertension: She has been followed at [facility], thought to have mixed group 1 and group 3 (ILD/NSIP) PH.  She has been on sildenafil and Tyvaso.  INCREASE study would suggest benefit for Tyvaso in patients like her.  She does not like Tyvaso because it seems to trigger coughing, and she wants to stop it.  Echo shows D-shaped interventricular septum with moderately dilated and mod-severely dysfunctional RV with estimated PASP 66 mmHg, bubble study negative. The IVC is not dilated.  RHC today showed moderate-range pulmonary arterial hypertension but PVR was high at 7.45 and cardiac index low at 1.9.  She had Tyvaso last night but not this morning.  - Continue sildenafil.  - I have talked with her about restarting Tyvaso for now.  I do not want her to get rebound elevation in her PA pressure and we do not have many options for getting  her on additional therapy acutely.  She agrees to restart Tyvaso (may do better with it now that she is diuresed, no coughing when she took it last night).  - Ultimately, will need to get her back to her [place] [facility] at [facility] after discharge for possible therapy adjustment, do not think that she acutely needs parenteral PAH therapy.  3. RV failure: She is now well-diuresed by [place].  She is off IV Lasix.  - Start Lasix 60 mg po bid for home, can start this evening.    4. Shingles: Now scabbed over, she is off precautions.  5. H/o SVT: On verapamil and atenolol, quiescent.  6. H/o DVT in  2020: On Eliquis 5 mg bid. Restart tonight post-cath.  7. ILD: NSIP.  She is on steroids + Cellcept at home, followed at [facility] as outpatient.  She had last Rituximab infusion in 1/21.   I think she is stable from the standpoint of pulmonary hypertension and RV failure.  She will start Lasix 60 mg po bid this evening.  She is willing to start back on Tyvaso.  She will need followup in her pulmonary hypertension clinic.  We will follow from a distance over the weekend unless called, will see Monday if she is still here.   Length of Stay: 3  Loralie Champagne, MD  04/26/2019, 1:37 PM  Advanced Heart Failure Team Pager [phone] (M-F; [place])  Please contact [facility] for night-coverage after hours (4p -7a ) and weekends on [email or web address]

## 2019-04-26 NOTE — Progress Notes (Signed)
[name]  PROGRESS NOTE    Felicia [name]  ZDG:[id number] DOB: 03/09/1970 DOA: 04/23/2019 PCP: Aretta Nip, MD   Brief Narrative:   Felicia [name] a 49 y.o.femalewithhistory of nonspecific interstitial pneumonitis, pulmonary hypertension, diastolic CHF, hypertension, diabetes mellitus presents to the ER because of worsening shortness of breath over the last 4 to 5 days. Has been examined cough and left-sided chest pain. Noticed some rash on the left side of the chest at the same time of worsening shortness of breath. Denies any nausea vomiting or diarrhea. Denies any fever or chills.  04/26/19: S/p RHC. HF team to start lasix 60m BID this evening and continue Tyvaso. Will need follow up with her pHTN clinic. She is looking better today. If stable ON, will d/c to home in AM.     Assessment & Plan:   Principal Problem:   Acute respiratory failure (HCC) Active Problems:   Interstitial pneumonitis (HCC)   Essential hypertension   Chronic diastolic HF (heart failure) (HCC)   Pulmonary HTN (HCC)   Interstitial lung disease (HCC)   CAP (community acquired pneumonia)   Shingles  Acute on chronic respiratory failure w/ hypoxia - on 15L HFNC at home per her report - Hx of pHTN, ILD - started on empiric abx of PNA - PCCM consulted, appreciate assistance - continue sildenifil and tyvaso; continue inhalers - add lasix - check echo - she is afebrile and WBC is ok     - 3/18: she is on 15L HFNC but says she is not quite back at baseline; pulm and HF team reviewing. Appreciate assistance.      - 3/19: s/p RHC; starting lasix 636mBID, resuming tyvaso; if stable, look for d/c in AM  Shingles - acyclovir x 5 days - gabapentin for neuralgia     - 3/18: she reports gabapentin is helping     - 3/19: today is day 3 of acyclovir; convert to PO in AM  pHTN ILD - see above  Elevated BNP - lasix, echo     - 3/19: Echo  results noted; see above  HTN - atenolol     - 3/18: BP is ok today, continue atenolol; she's getting another dose of lasix  Hypokalemia - replace, check Mg2+     - 3/18: continue K+; Mg2+ is ok.      - 3/19: continue to follow K+  Lactic acidosis - resolved  DM2 - lantus 24 units - SSI - DM diet     - 3/18: glucose is ok, continue current tx     - 3/19: add 4 units novolog WM  Hx of DVT - eliquis  DVT prophylaxis: eliquis Code Status: FULL   Disposition Plan: Home in AM if stable on new medical regimen  Consultants:   Pulmonology  Cardiology  Procedures:   RHC  Antimicrobials:  . Bactrim, acyclovir   ROS:  Reports improved breathing. Reports painful shingles . Remainder 10-pt ROS is negative for all not previously mentioned.  Subjective: "They are taking me to the cath lab today."  Objective: Vitals:   04/26/19 1329 04/26/19 1330 04/26/19 1350 04/26/19 1500  BP: 131/87 129/88 101/71 107/73  Pulse: 78 69 79 78  Resp: (!) 31 18 (!) 23 (!) 26  Temp:   98.6 F (37 C) 98.1 F (36.7 C)  TempSrc:   Oral Oral  SpO2: 99% 98% 100% 97%  Weight:      Height:        Intake/Output Summary (Last 24  hours) at 04/26/2019 1639 Last data filed at 04/26/2019 0327 Gross per 24 hour  Intake --  Output 600 ml  Net -600 ml   Filed Weights   04/24/19 1502 04/25/19 0629 04/26/19 0411  Weight: 100.7 kg 96.8 kg 99.5 kg    Examination:  General: 49 y.o. female resting in bed in NAD Cardiovascular: RRR, +S1, S2, no m/g/r Respiratory: decreased at bases, expiratory wheeze, normal WOB GI: BS+, NDNT, soft, obese MSK: No e/c/c Neuro: A&O x 3, follows commands Psyc: Appropriate interaction and affect, calm/cooperative  Data Reviewed: I have personally reviewed following labs and imaging studies.  CBC: Recent Labs  Lab 04/23/19 1800 04/23/19 1809 04/24/19 0610 04/25/19 0248 04/26/19 0254  WBC 8.4  --  7.2 8.0 6.1  NEUTROABS  7.7  --  5.4 6.4 5.5  HGB 12.7 15.0 12.0 13.8 12.8  HCT 44.2 44.0 40.8 47.1* 43.2  MCV 86.5  --  85.7 84.9 82.6  PLT 248  --  229 211 578   Basic Metabolic Panel: Recent Labs  Lab 04/23/19 1800 04/23/19 1809 04/24/19 0610 04/25/19 0248 04/26/19 0254  NA 138 137 141 141 137  K 4.2 3.9 3.4* 3.4* 3.7  CL 95*  --  99 97* 93*  CO2 27  --  _0 GLUCOSE 275*  --  118* 114* 309*  BUN 21*  --  20 15 23*  CREATININE 1.05*  --  0.77 0.86 1.07*  CALCIUM 8.9  --  8.5* 8.5* 8.7*  MG  --   --   --  2.2 1.9   GFR: Estimated Creatinine Clearance: 74.3 mL/min (A) (by C-G formula based on SCr of 1.07 mg/dL (H)). Liver Function Tests: Recent Labs  Lab 04/23/19 1800 04/24/19 0610  AST 21 15  ALT 31 28  ALKPHOS 44 37*  BILITOT 0.5 0.6  PROT 6.1* 5.3*  ALBUMIN 3.2* 2.9*   No results for input(s): LIPASE, AMYLASE in the last 168 hours. No results for input(s): AMMONIA in the last 168 hours. Coagulation Profile: No results for input(s): INR, PROTIME in the last 168 hours. Cardiac Enzymes: No results for input(s): CKTOTAL, CKMB, CKMBINDEX, TROPONINI in the last 168 hours. BNP (last 3 results) No results for input(s): PROBNP in the last 8760 hours. HbA1C: No results for input(s): HGBA1C in the last 72 hours. CBG: Recent Labs  Lab 04/25/19 2333 04/26/19 0320 04/26/19 0734 04/26/19 1211 04/26/19 1617  GLUCAP 329* 317* 271* 218* 190*   Lipid Profile: No results for input(s): CHOL, HDL, LDLCALC, TRIG, CHOLHDL, LDLDIRECT in the last 72 hours. Thyroid Function Tests: No results for input(s): TSH, T4TOTAL, FREET4, T3FREE, THYROIDAB in the last 72 hours. Anemia Panel: No results for input(s): VITAMINB12, FOLATE, FERRITIN, TIBC, IRON, RETICCTPCT in the last 72 hours. Sepsis Labs: Recent Labs  Lab 04/23/19 1800 04/24/19 0610  PROCALCITON  --  <0.10  LATICACIDVEN 3.4* 1.3    Recent Results (from the past 240 hour(s))  SARS CORONAVIRUS 2 (TAT 6-24 HRS) Nasopharyngeal  Nasopharyngeal Swab     Status: None   Collection Time: 04/23/19  4:46 PM   Specimen: Nasopharyngeal Swab  Result Value Ref Range Status   SARS Coronavirus 2 NEGATIVE NEGATIVE Final    Comment: (NOTE) SARS-CoV-2 target nucleic acids are NOT DETECTED. The SARS-CoV-2 RNA is generally detectable in upper and lower respiratory specimens during the acute phase of infection. Negative results do not preclude SARS-CoV-2 infection, do not rule out co-infections with other pathogens, and should not be  used as the sole basis for treatment or other patient management decisions. Negative results must be combined with clinical observations, patient history, and epidemiological information. The expected result is Negative. Fact Sheet for Patients: SugarRoll.be Fact Sheet for Healthcare Providers: https://www.woods-mathews.com/ This test is not yet approved or cleared by the [place] FDA and  has been authorized for detection and/or diagnosis of SARS-CoV-2 by FDA under an Emergency Use Authorization (EUA). This EUA will remain  in effect (meaning this test can be used) for the duration of the COVID-19 declaration under Section 56 4(b)(1) of the Act, 21 U.S.C. section 360bbb-3(b)(1), unless the authorization is terminated or revoked sooner. Performed at [facility], [place] [address], [place], [place] [address]   Culture, blood (routine x 2)     Status: None (Preliminary result)   Collection Time: 04/23/19  6:00 PM   Specimen: BLOOD RIGHT ARM  Result Value Ref Range Status   Specimen Description BLOOD RIGHT ARM  Final   Special Requests   Final    BOTTLES DRAWN AEROBIC AND ANAEROBIC Blood Culture adequate volume   Culture   Final    NO GROWTH 3 DAYS Performed at [facility], [place] [address] [place], [place] [address]    Report Status PENDING  Incomplete  Culture, blood (routine x 2)     Status: None (Preliminary result)   Collection  Time: 04/23/19  6:05 PM   Specimen: BLOOD RIGHT HAND  Result Value Ref Range Status   Specimen Description BLOOD RIGHT HAND  Final   Special Requests   Final    BOTTLES DRAWN AEROBIC AND ANAEROBIC Blood Culture adequate volume   Culture   Final    NO GROWTH 3 DAYS Performed at [facility], [place] [address], [place], [place] [address]    Report Status PENDING  Incomplete  MRSA PCR Screening     Status: None   Collection Time: 04/26/19  6:00 AM   Specimen: Nasopharyngeal  Result Value Ref Range Status   MRSA by PCR NEGATIVE NEGATIVE Final    Comment:        The GeneXpert MRSA Assay (FDA approved for NASAL specimens only), is one component of a comprehensive MRSA colonization surveillance program. It is not intended to diagnose MRSA infection nor to guide or monitor treatment for MRSA infections. Performed at [facility], [place] [address], [place], [place] [address]       Radiology Studies: CARDIAC CATHETERIZATION  Result Date: 04/26/2019 1. Filling pressures (PCWP, RAP) optimized. 2. Significant PAH with PVR 7.45 WU and low CI at 1.9.   ECHOCARDIOGRAM LIMITED BUBBLE STUDY  Result Date: 04/25/2019    ECHOCARDIOGRAM LIMITED REPORT   Patient Name:   Felicia [name] Date of Exam: 04/25/2019 Medical Rec #:  [id number]          Height:       65.0 in Accession #:    [id number]         Weight:       213.4 lb Date of Birth:  Jan 12, 1971          BSA:          2.033 m Patient Age:    22 years           BP:           110/79 mmHg Patient Gender: F                  HR:  97 bpm. Exam Location:  Inpatient Procedure: Limited Echo, Saline Contrast Bubble Study, Limited Color Doppler and            Cardiac Doppler Indications:    patent foramen ovale 745.5  History:        Patient has prior history of Echocardiogram examinations, most                 recent 02/03/2017. CHF, Pulmonary HTN, Signs/Symptoms:Shortness                 of Breath; Risk Factors:Hypertension and Diabetes.   Sonographer:    Donalds Referring Phys: [id number] [place]  1. The left ventricle has normal function. There is the interventricular septum is flattened in systole and diastole, consistent with right ventricular pressure and volume overload.  2. Right ventricular systolic function is moderately reduced. The right ventricular size is moderately enlarged. There is severely elevated pulmonary artery systolic pressure. The estimated right ventricular systolic pressure is 88.8 mmHg.  3. Right atrial size was mildly dilated.  4. Tricuspid valve regurgitation is moderate.  5. The aortic valve is grossly normal. Aortic valve regurgitation is not visualized.  6. The inferior vena cava is normal in size with greater than 50% respiratory variability, suggesting right atrial pressure of 3 mmHg.  7. Agitated saline contrast bubble study was negative, with no evidence of any interatrial shunt. FINDINGS  Left Ventricle: The left ventricle has normal function. There is no left ventricular hypertrophy. The interventricular septum is flattened in systole and diastole, consistent with right ventricular pressure and volume overload. Right Ventricle: The right ventricular size is moderately enlarged. No increase in right ventricular wall thickness. Right ventricular systolic function is moderately reduced. There is severely elevated pulmonary artery systolic pressure. The tricuspid regurgitant velocity is 3.91 m/s, and with an assumed right atrial pressure of 3 mmHg, the estimated right ventricular systolic pressure is 91.6 mmHg. Left Atrium: Left atrial size was normal in size. Right Atrium: Right atrial size was mildly dilated. Pericardium: There is no evidence of pericardial effusion. Mitral Valve: The mitral valve is normal in structure. Trivial mitral valve regurgitation. Tricuspid Valve: The tricuspid valve is normal in structure. Tricuspid valve regurgitation is moderate. Aortic Valve: The aortic  valve is grossly normal. Aortic valve regurgitation is not visualized. Pulmonic Valve: The pulmonic valve was grossly normal. Pulmonic valve regurgitation is trivial. Aorta: The aortic root is normal in size and structure. Venous: The inferior vena cava is normal in size with greater than 50% respiratory variability, suggesting right atrial pressure of 3 mmHg. IAS/Shunts: No atrial level shunt detected by color flow Doppler. Agitated saline contrast was given intravenously to evaluate for intracardiac shunting. Agitated saline contrast bubble study was negative, with no evidence of any interatrial shunt.  LEFT VENTRICLE PLAX 2D LVIDd:         4.30 cm LVIDs:         2.30 cm LV PW:         1.00 cm LV IVS:        0.90 cm LVOT diam:     2.00 cm LVOT Area:     3.14 cm  RIGHT VENTRICLE RV S prime:     9.90 cm/s TAPSE (M-mode): 1.2 cm LEFT ATRIUM         Index LA diam:    3.20 cm 1.57 cm/m   AORTA Ao Root diam: 2.90 cm TRICUSPID VALVE TR Peak grad:   61.2 mmHg TR  Vmax:        391.00 cm/s  SHUNTS Systemic Diam: 2.00 cm Adrian Prows MD Electronically signed by Adrian Prows MD Signature Date/Time: 04/25/2019/8:30:43 PM    Final      Scheduled Meds: . apixaban  5 mg Oral BID  . atenolol  75 mg Oral BID  . Chlorhexidine Gluconate Cloth  6 each Topical Daily  . cholecalciferol  1,000 Units Oral Daily  . gabapentin  300 mg Oral TID  . guaiFENesin  600 mg Oral Daily  . insulin aspart  0-9 Units Subcutaneous TID WC  . insulin glargine  24 Units Subcutaneous QHS  . methylPREDNISolone (SOLU-MEDROL) injection  60 mg Intravenous Q8H  . mycophenolate  1,500 mg Oral BID  . potassium chloride  40 mEq Oral Daily  . sildenafil  20 mg Oral BID  . sodium chloride flush  3 mL Intravenous Q12H  . sulfamethoxazole-trimethoprim  1 tablet Oral Once per day on Mon Wed Fri  . verapamil  120 mg Oral QHS   Continuous Infusions: . acyclovir 750 mg (04/26/19 1411)     LOS: 3 days    Time spent: 25 minutes spent in the coordination  of care today.    Jonnie Finner, DO [facility]  If 7PM-7AM, please contact night-coverage [email or web address] 04/26/2019, 4:39 PM

## 2019-04-26 NOTE — Interval H&P Note (Signed)
History and Physical Interval Note:  04/26/2019 1:31 PM  Felicia [name]  has presented today for surgery, with the diagnosis of pulmonary hypertension.  The various methods of treatment have been discussed with the patient and family. After consideration of risks, benefits and other options for treatment, the patient has consented to  Procedure(s): RIGHT HEART CATH (N/A) as a surgical intervention.  The patient's history has been reviewed, patient examined, no change in status, stable for surgery.  I have reviewed the patient's chart and labs.  Questions were answered to the patient's satisfaction.     [name]

## 2019-04-26 NOTE — Progress Notes (Addendum)
Inpatient Diabetes Program Recommendations  AACE/ADA: New Consensus Statement on Inpatient Glycemic Control (2015)  Target Ranges:  Prepandial:   less than 140 mg/dL      Peak postprandial:   less than 180 mg/dL (1-2 hours)      Critically ill patients:  140 - 180 mg/dL   Lab Results  Component Value Date   GLUCAP 218 (H) 04/26/2019   HGBA1C 8.6 (H) 02/24/2019    Review of Glycemic Control Diabetes history: Type 2 DM Outpatient Diabetes medications: Tresiba 24 units QD, Humalog 0-20 units BID, Metformin 1000 mg QHS, Novolog 6 units TID Current orders for Inpatient glycemic control: Lantus 24 units QHS, Novolog 0-9 units TID Solumedrol 60 mg Q8H  Inpatient Diabetes Program Recommendations:    Post prandials continue to exceed inpatient goals, once diet resumed consider adding Novolog 4 units TID (assuming patient is consuming >50% of meals).   Thanks, Bronson Curb, MSN, RNC-OB Diabetes Coordinator [phone] (8a-5p)

## 2019-04-27 DIAGNOSIS — J849 Interstitial pulmonary disease, unspecified: Secondary | ICD-10-CM

## 2019-04-27 LAB — BASIC METABOLIC PANEL
Anion gap: 14 (ref 5–15)
BUN: 25 mg/dL — ABNORMAL HIGH (ref 6–20)
CO2: 27 mmol/L (ref 22–32)
Calcium: 8.8 mg/dL — ABNORMAL LOW (ref 8.9–10.3)
Chloride: 93 mmol/L — ABNORMAL LOW (ref 98–111)
Creatinine, Ser: 1.08 mg/dL — ABNORMAL HIGH (ref 0.44–1.00)
GFR calc Af Amer: >60 mL/min (ref >60)
GFR calc non Af Amer: >60 mL/min (ref >60)
Glucose, Bld: 267 mg/dL — ABNORMAL HIGH (ref 70–99)
Potassium: 3.9 mmol/L (ref 3.5–5.1)
Sodium: 134 mmol/L — ABNORMAL LOW (ref 135–145)

## 2019-04-27 LAB — CBC WITH DIFFERENTIAL/PLATELET
Abs Immature Granulocytes: 0.07 10*3/uL (ref 0.00–0.07)
Basophils Absolute: 0 10*3/uL (ref 0.0–0.1)
Basophils Relative: 0 %
Eosinophils Absolute: 0 10*3/uL (ref 0.0–0.5)
Eosinophils Relative: 0 %
HCT: 41.5 % (ref 36.0–46.0)
Hemoglobin: 12.3 g/dL (ref 12.0–15.0)
Immature Granulocytes: 1 %
Lymphocytes Relative: 4 %
Lymphs Abs: 0.5 10*3/uL — ABNORMAL LOW (ref 0.7–4.0)
MCH: 24.7 pg — ABNORMAL LOW (ref 26.0–34.0)
MCHC: 29.6 g/dL — ABNORMAL LOW (ref 30.0–36.0)
MCV: 83.5 fL (ref 80.0–100.0)
Monocytes Absolute: 0.6 10*3/uL (ref 0.1–1.0)
Monocytes Relative: 5 %
Neutro Abs: 11.3 10*3/uL — ABNORMAL HIGH (ref 1.7–7.7)
Neutrophils Relative %: 90 %
Platelets: 225 10*3/uL (ref 150–400)
RBC: 4.97 MIL/uL (ref 3.87–5.11)
RDW: 19 % — ABNORMAL HIGH (ref 11.5–15.5)
WBC: 12.4 10*3/uL — ABNORMAL HIGH (ref 4.0–10.5)
nRBC: 0.2 % (ref 0.0–0.2)

## 2019-04-27 LAB — GLUCOSE, CAPILLARY
Glucose-Capillary: 269 mg/dL — ABNORMAL HIGH (ref 70–99)
Glucose-Capillary: 304 mg/dL — ABNORMAL HIGH (ref 70–99)
Glucose-Capillary: 307 mg/dL — ABNORMAL HIGH (ref 70–99)
Glucose-Capillary: 242 mg/dL — ABNORMAL HIGH (ref 70–99)

## 2019-04-27 LAB — MAGNESIUM: Magnesium: 2.2 mg/dL (ref 1.7–2.4)

## 2019-04-27 MED ORDER — VALACYCLOVIR HCL 500 MG PO TABS
1000.0000 mg | ORAL_TABLET | Freq: Three times a day (TID) | ORAL | Status: DC
  Administered 2019-04-27 – 2019-04-28 (×3): 1000 mg via ORAL
  Filled 2019-04-27 (×3): qty 2

## 2019-04-27 MED ORDER — PREDNISONE 20 MG PO TABS
40.0000 mg | ORAL_TABLET | Freq: Every day | ORAL | Status: DC
  Administered 2019-04-28: 40 mg via ORAL
  Filled 2019-04-27: qty 2

## 2019-04-27 MED ORDER — SENNOSIDES-DOCUSATE SODIUM 8.6-50 MG PO TABS
1.0000 | ORAL_TABLET | Freq: Every day | ORAL | Status: DC
  Administered 2019-04-27: 1 via ORAL
  Filled 2019-04-27: qty 1

## 2019-04-27 MED ORDER — METHYLPREDNISOLONE SODIUM SUCC 125 MG IJ SOLR: 60.0000 mg | Freq: Every day | INTRAMUSCULAR | Status: DC

## 2019-04-27 MED ORDER — TREPROSTINIL 0.6 MG/ML IN SOLN
18.0000 ug | Freq: Four times a day (QID) | RESPIRATORY_TRACT | Status: DC
  Administered 2019-04-27 – 2019-04-28 (×4): 18 ug via RESPIRATORY_TRACT

## 2019-04-27 NOTE — Progress Notes (Signed)
   NAME:  Felicia [name], MRN:  [id number], DOB:  [birth year], LOS: 4 ADMISSION DATE:  04/23/2019, CONSULTATION DATE:  04/24/19 REFERRING MD:  Marylyn Ishihara, CHIEF COMPLAINT:  Dyspnea   Brief History   49 year old woman with O2-dependent NSIP, groups 1/3 pulmonary HTN on revatio and tyvaso presenting with acute on chronic hypoxemia.  Most of her management is at [facility]. She is managed locally for acute issues by Dr. [name].  For her NSIP she is on prednisone 54m/day, cellcept and bactrim ppx.  She is on 15L, usually on 15L at home via two concentrators, she does feel like this is not enough however. She also has a shingles infection along her left chest.  Past Medical History  NSIP HTN DM  Significant Hospital Events   3/16 Admitted  Consults:  CHF  Procedures:  RHC Hemodynamics (mmHg) RA mean 7 RV 60/12 PA 60/24, mean 39 PCWP mean 10 Oxygen saturations: SVC 60% PA 61% AO 100% Cardiac Output (Fick) 3.89  Cardiac Index (Fick) 1.91 PVR 7.45 WU  Significant Diagnostic Tests:     Micro Data:  COVID neg  Antimicrobials:  Acyclovir   Interim history/subjective:  Feels better, thinks she is going home tomorrow  Just completed her Tyvaso    Afebrile   Objective   Blood pressure 114/70, pulse 81, temperature (!) 97.3 F (36.3 C), temperature source Axillary, resp. rate (!) 28, height _0  (1.651 m), weight 99.5 kg, SpO2 97 %.        Intake/Output Summary (Last 24 hours) at 04/27/2019 1609 Last data filed at 04/27/2019 1334 Gross per 24 hour  Intake 403 ml  Output 1450 ml  Net -1047 ml   Filed Weights   04/24/19 1502 04/25/19 0629 04/26/19 0411  Weight: 100.7 kg 96.8 kg 99.5 kg    Examination: General: adult female sitting in bed, cushingnoid in appearance  HEENT: MM pink/moist, Mutual O2 Neuro: AAOx4, speech clear CV: s1s2 irregular, no m/r/g PULM:  Non-labored, lungs bilaterally with bilateral crackles  GI: soft, bsx4 active  Extremities: warm/dry, no edema, clubbing  noted  Skin: left chest rash / scabbed over   Resolved Hospital Problem list      Assessment & Plan:   Acute on chronic hypoxemic respiratory failure in patient with (A) advanced NSIP question in flare PFT Sept 2019 Ratio 0.96, FEV1 1.18L (41% pred), FVC 1.24L (35% pred), DLCO 19% pred Not transplant candidate due to weight at present (needs BMI < 30)  (B) groups 1/3 pulmonary HTN;  05/05/17 RHC PAmean 38, Wedge 12, CI 2.0, PVR 6.3 04/26/18 RHC PAmean 39, Wedge 10, CI 1.9, PVR 7.45  (C) likely fluid overloaded state of heart: resolved  Plan: -appreciate Cardiology, TRH assistance with patient care -continue PTA Tyvaso, modified revatio, & cellcept with bactrim prophylaxis  -return to home dosing of prednisone  -continue eliquis  [facility]  -wean O2 for sats >90% -encouraged home PT efforts    BNoe Gens MSN, NP-C [facility] 04/27/2019, 4:16 PM   Please see [email or web address] for pager details.

## 2019-04-27 NOTE — Progress Notes (Signed)
[name]  PROGRESS NOTE    Aigner Horseman  GOT:[id number] DOB: 06/11/1970 DOA: 04/23/2019 PCP: Aretta Nip, MD   Brief Narrative:   Lavanda Nevels a 49 y.o.femalewithhistory of nonspecific interstitial pneumonitis, pulmonary hypertension, diastolic CHF, hypertension, diabetes mellitus presents to the ER because of worsening shortness of breath over the last 4 to 5 days. Has been examined cough and left-sided chest pain. Noticed some rash on the left side of the chest at the same time of worsening shortness of breath. Denies any nausea vomiting or diarrhea. Denies any fever or chills.  04/27/19: She says she is not quite back to baseline status. Decrease steroids today. Continue lasix. Hopeful for d/c in AM.    Assessment & Plan:   Principal Problem:   Acute respiratory failure ([place]) Active Problems:   Interstitial pneumonitis (HCC)   Essential hypertension   Chronic diastolic HF (heart failure) (HCC)   Pulmonary HTN (HCC)   Interstitial lung disease (HCC)   CAP (community acquired pneumonia)   Shingles  Acute on chronic respiratory failure w/ hypoxia - on 15L HFNC at home per her report - Hx of pHTN, ILD - started on empiric abx of PNA - PCCM consulted, appreciate assistance - continue sildenifil and tyvaso; continue inhalers - add lasix - check echo - she is afebrile and WBC is ok - 3/18: she is on 15L HFNC but says she is not quite back at baseline; pulm and HF team reviewing. Appreciate assistance.     - 3/19: s/p RHC; starting lasix 66m BID, resuming tyvaso; if stable, look for d/c in AM     - 3/20: she says she is not base to baseline respiratory status today; continue lasix 679mBID, continue tyvaso, we will move down her steroids today as she is clearer on exam  Shingles - acyclovir - gabapentin for neuralgia - 3/18: she reports gabapentin is helping     - 3/19: today is day 3 of acyclovir; convert  to PO in AM     - 3/20: valcyclovir 1g TID for 4 additional days  pHTN ILD - see above  Elevated BNP - lasix, echo - 3/19: Echo results noted; see above  HTN - atenolol - 3/18: BP is ok today, continue atenolol; she's getting another dose of lasix  Hypokalemia - replace, check Mg2+ - 3/18: continue K+; Mg2+ is ok.     - 3/19: continue to follow K+  Lactic acidosis - resolved  DM2 - lantus 24 units - SSI - DM diet - 3/18: glucose is ok, continue current tx     - 3/19: add 4 units novolog WM  Hx of DVT - eliquis  DVT prophylaxis: eliquis Code Status: FULL   Disposition Plan: She states she's not at respiratory baseline; likely d/c in AM  Consultants:   Cardiology  Pulmonology  Procedures:   RHC  Antimicrobials:  . valcyclovir . bactrim   ROS:  Reports dyspnea, tiredness . Remainder 10-pt ROS is negative for all not previously mentioned.  Subjective: "I don't feel normal."  Objective: Vitals:   04/26/19 1500 04/26/19 2014 04/27/19 0035 04/27/19 0300  BP: 107/73 110/75 116/82 121/80  Pulse: 78 (!) 106 88 90  Resp: (!) _0 Temp: 98.1 F (36.7 C) 97.6 F (36.4 C) 98.6 F (37 C) 98 F (36.7 C)  TempSrc: Oral  Oral Oral  SpO2: 97% 95% 97% 97%  Weight:      Height:        Intake/Output  Summary (Last 24 hours) at 04/27/2019 0716 Last data filed at 04/26/2019 2140 Gross per 24 hour  Intake 3 ml  Output 500 ml  Net -497 ml   Filed Weights   04/24/19 1502 04/25/19 0629 04/26/19 0411  Weight: 100.7 kg 96.8 kg 99.5 kg    Examination:  General: 49 y.o. female resting in bed in NAD Cardiovascular: RRR, +S1, S2, no m/g/r, equal pulses throughout Respiratory: decreased at bases, expiratory wheeze is greatly reduced GI: BS+, NDNT, soft, obese MSK: No e/c/c Neuro: A&O x 3, no focal deficits Psyc:calm/cooperative   Data Reviewed: I have personally reviewed following labs and  imaging studies.  CBC: Recent Labs  Lab 04/23/19 1800 04/23/19 1809 04/24/19 0610 04/24/19 0610 04/25/19 0248 04/26/19 0254 04/26/19 1322 04/26/19 1326 04/27/19 0254  WBC 8.4  --  7.2  --  8.0 6.1  --   --  12.4*  NEUTROABS 7.7  --  5.4  --  6.4 5.5  --   --  11.3*  HGB 12.7   < > 12.0   < > 13.8 12.8 15.0 15.0  15.0 12.3  HCT 44.2   < > 40.8   < > 47.1* 43.2 44.0 44.0  44.0 41.5  MCV 86.5  --  85.7  --  84.9 82.6  --   --  83.5  PLT 248  --  229  --  211 212  --   --  225   < > = values in this interval not displayed.   Basic Metabolic Panel: Recent Labs  Lab 04/23/19 1800 04/23/19 1809 04/24/19 0610 04/24/19 0610 04/25/19 0248 04/26/19 0254 04/26/19 1322 04/26/19 1326 04/27/19 0254  NA 138   < > 141   < > 141 137 138 137  138 134*  K 4.2   < > 3.4*   < > 3.4* 3.7 3.7 3.8  3.9 3.9  CL 95*  --  99  --  97* 93*  --   --  93*  CO2 27  --  28  --  29 28  --   --  27  GLUCOSE 275*  --  118*  --  114* 309*  --   --  267*  BUN 21*  --  20  --  15 23*  --   --  25*  CREATININE 1.05*  --  0.77  --  0.86 1.07*  --   --  1.08*  CALCIUM 8.9  --  8.5*  --  8.5* 8.7*  --   --  8.8*  MG  --   --   --   --  2.2 1.9  --   --  2.2   < > = values in this interval not displayed.   GFR: Estimated Creatinine Clearance: 73.6 mL/min (A) (by C-G formula based on SCr of 1.08 mg/dL (H)). Liver Function Tests: Recent Labs  Lab 04/23/19 1800 04/24/19 0610  AST 21 15  ALT 31 28  ALKPHOS 44 37*  BILITOT 0.5 0.6  PROT 6.1* 5.3*  ALBUMIN 3.2* 2.9*   No results for input(s): LIPASE, AMYLASE in the last 168 hours. No results for input(s): AMMONIA in the last 168 hours. Coagulation Profile: No results for input(s): INR, PROTIME in the last 168 hours. Cardiac Enzymes: No results for input(s): CKTOTAL, CKMB, CKMBINDEX, TROPONINI in the last 168 hours. BNP (last 3 results) No results for input(s): PROBNP in the last 8760 hours. HbA1C: No results for input(s): HGBA1C in the  last 72  hours. CBG: Recent Labs  Lab 04/26/19 0320 04/26/19 0734 04/26/19 1211 04/26/19 1617 04/26/19 2010  GLUCAP 317* 271* 218* 190* 293*   Lipid Profile: No results for input(s): CHOL, HDL, LDLCALC, TRIG, CHOLHDL, LDLDIRECT in the last 72 hours. Thyroid Function Tests: No results for input(s): TSH, T4TOTAL, FREET4, T3FREE, THYROIDAB in the last 72 hours. Anemia Panel: No results for input(s): VITAMINB12, FOLATE, FERRITIN, TIBC, IRON, RETICCTPCT in the last 72 hours. Sepsis Labs: Recent Labs  Lab 04/23/19 1800 04/24/19 0610  PROCALCITON  --  <0.10  LATICACIDVEN 3.4* 1.3    Recent Results (from the past 240 hour(s))  SARS CORONAVIRUS 2 (TAT 6-24 HRS) Nasopharyngeal Nasopharyngeal Swab     Status: None   Collection Time: 04/23/19  4:46 PM   Specimen: Nasopharyngeal Swab  Result Value Ref Range Status   SARS Coronavirus 2 NEGATIVE NEGATIVE Final    Comment: (NOTE) SARS-CoV-2 target nucleic acids are NOT DETECTED. The SARS-CoV-2 RNA is generally detectable in upper and lower respiratory specimens during the acute phase of infection. Negative results do not preclude SARS-CoV-2 infection, do not rule out co-infections with other pathogens, and should not be used as the sole basis for treatment or other patient management decisions. Negative results must be combined with clinical observations, patient history, and epidemiological information. The expected result is Negative. Fact Sheet for Patients: SugarRoll.be Fact Sheet for Healthcare Providers: https://www.woods-mathews.com/ This test is not yet approved or cleared by the [place] FDA and  has been authorized for detection and/or diagnosis of SARS-CoV-2 by FDA under an Emergency Use Authorization (EUA). This EUA will remain  in effect (meaning this test can be used) for the duration of the COVID-19 declaration under Section 56 4(b)(1) of the Act, 21 U.S.C. section 360bbb-3(b)(1),  unless the authorization is terminated or revoked sooner. Performed at [facility], [place] [address], [place], [place] [address]   Culture, blood (routine x 2)     Status: None (Preliminary result)   Collection Time: 04/23/19  6:00 PM   Specimen: BLOOD RIGHT ARM  Result Value Ref Range Status   Specimen Description BLOOD RIGHT ARM  Final   Special Requests   Final    BOTTLES DRAWN AEROBIC AND ANAEROBIC Blood Culture adequate volume   Culture   Final    NO GROWTH 4 DAYS Performed at [facility], [place] [address], [place], [place] [address]    Report Status PENDING  Incomplete  Culture, blood (routine x 2)     Status: None (Preliminary result)   Collection Time: 04/23/19  6:05 PM   Specimen: BLOOD RIGHT HAND  Result Value Ref Range Status   Specimen Description BLOOD RIGHT HAND  Final   Special Requests   Final    BOTTLES DRAWN AEROBIC AND ANAEROBIC Blood Culture adequate volume   Culture   Final    NO GROWTH 4 DAYS Performed at [facility], [place] [address] [place], [place] [address]    Report Status PENDING  Incomplete  MRSA PCR Screening     Status: None   Collection Time: 04/26/19  6:00 AM   Specimen: Nasopharyngeal  Result Value Ref Range Status   MRSA by PCR NEGATIVE NEGATIVE Final    Comment:        The GeneXpert MRSA Assay (FDA approved for NASAL specimens only), is one component of a comprehensive MRSA colonization surveillance program. It is not intended to diagnose MRSA infection nor to guide or monitor  treatment for MRSA infections. Performed at [facility], [place] [address] [place], [place] [address]       Radiology Studies: CARDIAC CATHETERIZATION  Result Date: 04/26/2019 1. Filling pressures (PCWP, RAP) optimized. 2. Significant PAH with PVR 7.45 WU and low CI at 1.9.   ECHOCARDIOGRAM LIMITED BUBBLE STUDY  Result Date: 04/25/2019    ECHOCARDIOGRAM LIMITED REPORT   Patient Name:   CALLY NYGARD Date of Exam: 04/25/2019  Medical Rec #:  [id number]          Height:       65.0 in Accession #:    [id number]         Weight:       213.4 lb Date of Birth:  [birth year]          BSA:          2.033 m Patient Age:    6 years           BP:           110/79 mmHg Patient Gender: F                  HR:           97 bpm. Exam Location:  Inpatient Procedure: Limited Echo, Saline Contrast Bubble Study, Limited Color Doppler and            Cardiac Doppler Indications:    patent foramen ovale 745.5  History:        Patient has prior history of Echocardiogram examinations, most                 recent 02/03/2017. CHF, Pulmonary HTN, Signs/Symptoms:Shortness                 of Breath; Risk Factors:Hypertension and Diabetes.  Sonographer:    [place] Referring Phys: [id number] [place]  1. The left ventricle has normal function. There is the interventricular septum is flattened in systole and diastole, consistent with right ventricular pressure and volume overload.  2. Right ventricular systolic function is moderately reduced. The right ventricular size is moderately enlarged. There is severely elevated pulmonary artery systolic pressure. The estimated right ventricular systolic pressure is 93.8 mmHg.  3. Right atrial size was mildly dilated.  4. Tricuspid valve regurgitation is moderate.  5. The aortic valve is grossly normal. Aortic valve regurgitation is not visualized.  6. The inferior vena cava is normal in size with greater than 50% respiratory variability, suggesting right atrial pressure of 3 mmHg.  7. Agitated saline contrast bubble study was negative, with no evidence of any interatrial shunt. FINDINGS  Left Ventricle: The left ventricle has normal function. There is no left ventricular hypertrophy. The interventricular septum is flattened in systole and diastole, consistent with right ventricular pressure and volume overload. Right Ventricle: The right ventricular size is moderately enlarged. No increase in right  ventricular wall thickness. Right ventricular systolic function is moderately reduced. There is severely elevated pulmonary artery systolic pressure. The tricuspid regurgitant velocity is 3.91 m/s, and with an assumed right atrial pressure of 3 mmHg, the estimated right ventricular systolic pressure is 18.2 mmHg. Left Atrium: Left atrial size was normal in size. Right Atrium: Right atrial size was mildly dilated. Pericardium: There is no evidence of pericardial effusion. Mitral Valve: The mitral valve is normal in structure. Trivial mitral valve regurgitation. Tricuspid Valve: The tricuspid valve is normal in structure. Tricuspid valve regurgitation is moderate. Aortic Valve: The aortic  valve is grossly normal. Aortic valve regurgitation is not visualized. Pulmonic Valve: The pulmonic valve was grossly normal. Pulmonic valve regurgitation is trivial. Aorta: The aortic root is normal in size and structure. Venous: The inferior vena cava is normal in size with greater than 50% respiratory variability, suggesting right atrial pressure of 3 mmHg. IAS/Shunts: No atrial level shunt detected by color flow Doppler. Agitated saline contrast was given intravenously to evaluate for intracardiac shunting. Agitated saline contrast bubble study was negative, with no evidence of any interatrial shunt.  LEFT VENTRICLE PLAX 2D LVIDd:         4.30 cm LVIDs:         2.30 cm LV PW:         1.00 cm LV IVS:        0.90 cm LVOT diam:     2.00 cm LVOT Area:     3.14 cm  RIGHT VENTRICLE RV S prime:     9.90 cm/s TAPSE (M-mode): 1.2 cm LEFT ATRIUM         Index LA diam:    3.20 cm 1.57 cm/m   AORTA Ao Root diam: 2.90 cm TRICUSPID VALVE TR Peak grad:   61.2 mmHg TR Vmax:        391.00 cm/s  SHUNTS Systemic Diam: 2.00 cm Adrian Prows MD Electronically signed by Adrian Prows MD Signature Date/Time: 04/25/2019/8:30:43 PM    Final      Scheduled Meds: . apixaban  5 mg Oral BID  . atenolol  75 mg Oral BID  . Chlorhexidine Gluconate Cloth  6 each  Topical Daily  . cholecalciferol  1,000 Units Oral Daily  . gabapentin  300 mg Oral TID  . guaiFENesin  600 mg Oral Daily  . insulin aspart  0-9 Units Subcutaneous TID WC  . insulin aspart  4 Units Subcutaneous TID WC  . insulin glargine  24 Units Subcutaneous QHS  . methylPREDNISolone (SOLU-MEDROL) injection  60 mg Intravenous Q8H  . mycophenolate  1,500 mg Oral BID  . potassium chloride  40 mEq Oral Daily  . sildenafil  20 mg Oral BID  . sodium chloride flush  3 mL Intravenous Q12H  . sulfamethoxazole-trimethoprim  1 tablet Oral Once per day on Mon Wed Fri  . verapamil  120 mg Oral QHS   Continuous Infusions: . acyclovir 750 mg (04/27/19 0538)     LOS: 4 days    Time spent: 25 minutes spent in the coordination of care today.    Jonnie Finner, DO [facility]  If 7PM-7AM, please contact night-coverage [email or web address] 04/27/2019, 7:16 AM

## 2019-04-27 NOTE — Plan of Care (Signed)
Plan of care reviewed with pt at bedside. Pt pleasant, VSS, on 13-15L HFNC. Pt oxygen drops into 80s when up and moving, but able to recover. Foley dc, due to void at 2000. Pt BM on BSC.  Call bell in reach, safety bundle in place. Will continue to monitor pt.  Problem: Education: Goal: Knowledge of General Education information will improve Description: Including pain rating scale, medication(s)/side effects and non-pharmacologic comfort measures Outcome: Progressing   Problem: Health Behavior/Discharge Planning: Goal: Ability to manage health-related needs will improve Outcome: Progressing   Problem: Clinical Measurements: Goal: Ability to maintain clinical measurements within normal limits will improve Outcome: Progressing Goal: Will remain free from infection Outcome: Progressing Goal: Diagnostic test results will improve Outcome: Progressing Goal: Respiratory complications will improve Outcome: Progressing Goal: Cardiovascular complication will be avoided Outcome: Progressing   Problem: Activity: Goal: Risk for activity intolerance will decrease Outcome: Progressing   Problem: Nutrition: Goal: Adequate nutrition will be maintained Outcome: Progressing   Problem: Coping: Goal: Level of anxiety will decrease Outcome: Progressing   Problem: Elimination: Goal: Will not experience complications related to bowel motility Outcome: Progressing Goal: Will not experience complications related to urinary retention Outcome: Progressing   Problem: Pain Managment: Goal: General experience of comfort will improve Outcome: Progressing   Problem: Safety: Goal: Ability to remain free from injury will improve Outcome: Progressing   Problem: Skin Integrity: Goal: Risk for impaired skin integrity will decrease Outcome: Progressing

## 2019-04-28 DIAGNOSIS — J189 Pneumonia, unspecified organism: Secondary | ICD-10-CM

## 2019-04-28 DIAGNOSIS — E876 Hypokalemia: Secondary | ICD-10-CM

## 2019-04-28 LAB — GLUCOSE, CAPILLARY
Glucose-Capillary: 182 mg/dL — ABNORMAL HIGH (ref 70–99)
Glucose-Capillary: 268 mg/dL — ABNORMAL HIGH (ref 70–99)

## 2019-04-28 LAB — CULTURE, BLOOD (ROUTINE X 2)
Culture: NO GROWTH
Culture: NO GROWTH
Special Requests: ADEQUATE
Special Requests: ADEQUATE

## 2019-04-28 LAB — BASIC METABOLIC PANEL
Anion gap: 11 (ref 5–15)
BUN: 27 mg/dL — ABNORMAL HIGH (ref 6–20)
CO2: 29 mmol/L (ref 22–32)
Calcium: 9.1 mg/dL (ref 8.9–10.3)
Chloride: 97 mmol/L — ABNORMAL LOW (ref 98–111)
Creatinine, Ser: 0.92 mg/dL (ref 0.44–1.00)
GFR calc Af Amer: >60 mL/min (ref >60)
GFR calc non Af Amer: >60 mL/min (ref >60)
Glucose, Bld: 230 mg/dL — ABNORMAL HIGH (ref 70–99)
Potassium: 3.7 mmol/L (ref 3.5–5.1)
Sodium: 137 mmol/L (ref 135–145)

## 2019-04-28 LAB — MAGNESIUM: Magnesium: 2.1 mg/dL (ref 1.7–2.4)

## 2019-04-28 MED ORDER — VALACYCLOVIR HCL 1 G PO TABS: 1000.0000 mg | ORAL_TABLET | Freq: Three times a day (TID) | ORAL | 0 refills | Status: AC

## 2019-04-28 MED ORDER — GABAPENTIN 300 MG PO CAPS: 300.0000 mg | ORAL_CAPSULE | Freq: Three times a day (TID) | ORAL | 0 refills | Status: DC

## 2019-04-28 MED ORDER — FUROSEMIDE 20 MG PO TABS: 60.0000 mg | ORAL_TABLET | Freq: Two times a day (BID) | ORAL | 1 refills | Status: DC

## 2019-04-28 MED ORDER — POTASSIUM CHLORIDE CRYS ER 20 MEQ PO TBCR: 40.0000 meq | EXTENDED_RELEASE_TABLET | Freq: Every day | ORAL | 0 refills | Status: DC

## 2019-04-28 NOTE — Discharge Summary (Signed)
. Physician Discharge Summary  Darolyn Double VOZ:[id number] DOB: 10/08/1970 DOA: 04/23/2019  PCP: Aretta Nip, MD  Admit date: 04/23/2019 Discharge date: 04/28/2019  Admitted From: Home Disposition:  Discharged to home.   Recommendations for Outpatient Follow-up:  1. Follow up with PCP in 5-7 days. 2. Please obtain BMP/CBC in one week 3. Follow up with [facility].   Discharge Condition: Stable  CODE STATUS: FULL   Brief/Interim Summary: Felicia [name] a 49 y.o.femalewithhistory of nonspecific interstitial pneumonitis, pulmonary hypertension, diastolic CHF, hypertension, diabetes mellitus presents to the ER because of worsening shortness of breath over the last 4 to 5 days. Has been examined cough and left-sided chest pain. Noticed some rash on the left side of the chest at the same time of worsening shortness of breath. Denies any nausea vomiting or diarrhea. Denies any fever or chills.  04/28/19:Back to baselines. Will continue home dose steroids. Will need to follow up with her pulmonary team at [facility] as well as her regular pulmonologist. She will need to follow up with her PCP in 5 - 7 days. On acyclovir through 3/23. Will have gabapentin for neuralgia for a few days more.   Discharge Diagnoses:  Principal Problem:   Acute respiratory failure ([place]) Active Problems:   Interstitial pneumonitis (HCC)   Essential hypertension   Chronic diastolic HF (heart failure) (HCC)   Pulmonary HTN (HCC)   Interstitial lung disease (HCC)   CAP (community acquired pneumonia)   Shingles  Acute on chronic respiratory failure w/ hypoxia - on 15L HFNC at home per her report - Hx of pHTN, ILD - started on empiric abx of PNA - PCCM consulted, appreciate assistance - continue sildenifil and tyvaso; continue inhalers - add lasix - check echo  - she is afebrile and WBC is ok - 3/18: she is on 15L HFNC but says she is not quite back at  baseline; pulm and HF team reviewing. Appreciate assistance. - 3/19: s/p RHC; starting lasix 42m BID, resuming tyvaso; if stable, look for d/c in AM     - 3/20: she says she is not base to baseline respiratory status today; continue lasix 613mBID, continue tyvaso, we will move down her steroids today as she is clearer on exam     - 3/21: Continue lasix 6068mO BID at discharge. Continue tyvaso. Resume home dose on steroids  Shingles - acyclovir - gabapentin for neuralgia - 3/18: she reports gabapentin is helping - 3/19: today is day 3 of acyclovir; convert to PO in AM     - 3/20: valcyclovir 1g TID for 4 additional days     - 3/21: valcyclovir through 3/23. Gabapentin for a few days more.  pHTN ILD - see above  Elevated BNP - lasix, echo - 3/19: Echoresults noted; see below     - 3/21: placed on lasix 4m74m BID, follow up with PCP in 5 - 7 days for renal testing  HTN - atenolol - 3/18: BP is ok today, continue atenolol; she's getting another dose of lasix     - 3/21: BP ok; follow up with PCP  Hypokalemia - replace, check Mg2+ - 3/18: continue K+; Mg2+ is ok. - 3/19: continue to follow K+  Lactic acidosis - resolved  DM2 - lantus 24 units - SSI - DM diet - 3/18: glucose is ok, continue current tx - 3/19: add 4 units novolog WM     - 3/21: resume home insulin regimen at discharge  Hx of DVT -  eliquis  Discharge Instructions   Allergies as of 04/28/2019   No Known Allergies     Medication List    STOP taking these medications   insulin aspart 100 UNIT/ML injection Commonly known as: NovoLOG   insulin detemir 100 UNIT/ML injection Commonly known as: Levemir     TAKE these medications   acetaminophen 500 MG tablet Commonly known as: TYLENOL Take 2,000 mg by mouth as needed for moderate pain.   albuterol 108 (90 Base) MCG/ACT inhaler Commonly known as: VENTOLIN  HFA Inhale 1 puff into the lungs every 6 (six) hours as needed for wheezing or shortness of breath.   apixaban 5 MG Tabs tablet Commonly known as: Eliquis Take 1 tablet (5 mg total) by mouth 2 (two) times daily.   atenolol 50 MG tablet Commonly known as: TENORMIN Take 75 mg by mouth 2 (two) times daily.   cholecalciferol 25 MCG (1000 UNIT) tablet Commonly known as: VITAMIN D3 Take 1,000 Units by mouth daily.   furosemide 20 MG tablet Commonly known as: Lasix Take 3 tablets (60 mg total) by mouth 2 (two) times daily. What changed:   medication strength  how much to take   gabapentin 300 MG capsule Commonly known as: NEURONTIN Take 1 capsule (300 mg total) by mouth 3 (three) times daily for 7 days.   HumaLOG KwikPen 100 UNIT/ML KwikPen Generic drug: insulin lispro Inject 0-20 Units into the skin 2 (two) times daily. Sliding scale based on sugar level   ipratropium-albuterol 0.5-2.5 (3) MG/3ML Soln Commonly known as: DUONEB Inhale 3 mLs into the lungs every 6 (six) hours as needed (sob and wheezing).   metFORMIN 500 MG 24 hr tablet Commonly known as: GLUCOPHAGE-XR Take 1,000 mg by mouth at bedtime.   Mucinex 600 MG 12 hr tablet Generic drug: guaiFENesin Take 600 mg by mouth daily.   mycophenolate 500 MG tablet Commonly known as: CELLCEPT Take 1,500 mg by mouth 2 (two) times daily.   naproxen sodium 220 MG tablet Commonly known as: ALEVE Take 440 mg by mouth as needed (pain).   potassium chloride SA 20 MEQ tablet Commonly known as: KLOR-CON Take 2 tablets (40 mEq total) by mouth daily. Start taking on: April 29, 2019   predniSONE 10 MG tablet Commonly known as: DELTASONE Take 40 mg by mouth daily with breakfast.   sildenafil 20 MG tablet Commonly known as: REVATIO Take 20 mg by mouth 2 (two) times daily.   sulfamethoxazole-trimethoprim 800-160 MG tablet Commonly known as: BACTRIM DS Take 1 tablet by mouth 3 (three) times a week.   Tyler Aas FlexTouch 100  UNIT/ML FlexTouch Pen Generic drug: insulin degludec Inject 24 Units into the skin daily.   Tyvaso Refill 0.6 MG/ML Soln Generic drug: Treprostinil Inhale 18 mcg into the lungs every 6 (six) hours.   valACYclovir 1000 MG tablet Commonly known as: VALTREX Take 1 tablet (1,000 mg total) by mouth 3 (three) times daily for 8 doses.   verapamil 120 MG CR tablet Commonly known as: CALAN-SR Take 1 tablet (120 mg total) by mouth at bedtime.       No Known Allergies  Consultations:  Cardiology  Pulmonology   Procedures/Studies: CARDIAC CATHETERIZATION  Result Date: 04/26/2019 1. Filling pressures (PCWP, RAP) optimized. 2. Significant PAH with PVR 7.45 WU and low CI at 1.9.   DG Chest Port 1 View  Result Date: 04/23/2019 CLINICAL DATA:  Left-sided chest pain and shortness of breath. EXAM: PORTABLE CHEST 1 VIEW COMPARISON:  February 21, 2018 FINDINGS:  Mild to moderate severity bilateral infiltrates are seen involving predominantly the mid lung fields and bilateral lung bases. There is no evidence of a pleural effusion or pneumothorax. The cardiac silhouette is moderately enlarged unchanged in size. The visualized skeletal structures are unremarkable. IMPRESSION: 1. Mild to moderate severity bilateral infiltrates. 2. Stable cardiomegaly. Electronically Signed   By: Virgina Norfolk M.D.   On: 04/23/2019 17:02   ECHOCARDIOGRAM LIMITED BUBBLE STUDY  Result Date: 04/25/2019    ECHOCARDIOGRAM LIMITED REPORT   Patient Name:   Felicia [name] Date of Exam: 04/25/2019 Medical Rec #:  [id number]          Height:       65.0 in Accession #:    [id number]         Weight:       213.4 lb Date of Birth:  1970-10-18          BSA:          2.033 m Patient Age:    7 years           BP:           110/79 mmHg Patient Gender: F                  HR:           97 bpm. Exam Location:  Inpatient Procedure: Limited Echo, Saline Contrast Bubble Study, Limited Color Doppler and            Cardiac Doppler  Indications:    patent foramen ovale 745.5  History:        Patient has prior history of Echocardiogram examinations, most                 recent 02/03/2017. CHF, Pulmonary HTN, Signs/Symptoms:Shortness                 of Breath; Risk Factors:Hypertension and Diabetes.  Sonographer:    [place] Referring Phys: [id number]  1. The left ventricle has normal function. There is the interventricular septum is flattened in systole and diastole, consistent with right ventricular pressure and volume overload.  2. Right ventricular systolic function is moderately reduced. The right ventricular size is moderately enlarged. There is severely elevated pulmonary artery systolic pressure. The estimated right ventricular systolic pressure is 30.8 mmHg.  3. Right atrial size was mildly dilated.  4. Tricuspid valve regurgitation is moderate.  5. The aortic valve is grossly normal. Aortic valve regurgitation is not visualized.  6. The inferior vena cava is normal in size with greater than 50% respiratory variability, suggesting right atrial pressure of 3 mmHg.  7. Agitated saline contrast bubble study was negative, with no evidence of any interatrial shunt. FINDINGS  Left Ventricle: The left ventricle has normal function. There is no left ventricular hypertrophy. The interventricular septum is flattened in systole and diastole, consistent with right ventricular pressure and volume overload. Right Ventricle: The right ventricular size is moderately enlarged. No increase in right ventricular wall thickness. Right ventricular systolic function is moderately reduced. There is severely elevated pulmonary artery systolic pressure. The tricuspid regurgitant velocity is 3.91 m/s, and with an assumed right atrial pressure of 3 mmHg, the estimated right ventricular systolic pressure is 65.7 mmHg. Left Atrium: Left atrial size was normal in size. Right Atrium: Right atrial size was mildly dilated.  Pericardium: There is no evidence of pericardial effusion. Mitral Valve: The mitral valve is normal in structure. Trivial mitral valve  regurgitation. Tricuspid Valve: The tricuspid valve is normal in structure. Tricuspid valve regurgitation is moderate. Aortic Valve: The aortic valve is grossly normal. Aortic valve regurgitation is not visualized. Pulmonic Valve: The pulmonic valve was grossly normal. Pulmonic valve regurgitation is trivial. Aorta: The aortic root is normal in size and structure. Venous: The inferior vena cava is normal in size with greater than 50% respiratory variability, suggesting right atrial pressure of 3 mmHg. IAS/Shunts: No atrial level shunt detected by color flow Doppler. Agitated saline contrast was given intravenously to evaluate for intracardiac shunting. Agitated saline contrast bubble study was negative, with no evidence of any interatrial shunt.  LEFT VENTRICLE PLAX 2D LVIDd:         4.30 cm LVIDs:         2.30 cm LV PW:         1.00 cm LV IVS:        0.90 cm LVOT diam:     2.00 cm LVOT Area:     3.14 cm  RIGHT VENTRICLE RV S prime:     9.90 cm/s TAPSE (M-mode): 1.2 cm LEFT ATRIUM         Index LA diam:    3.20 cm 1.57 cm/m   AORTA Ao Root diam: 2.90 cm TRICUSPID VALVE TR Peak grad:   61.2 mmHg TR Vmax:        391.00 cm/s  SHUNTS Systemic Diam: 2.00 cm Adrian Prows MD Electronically signed by Adrian Prows MD Signature Date/Time: 04/25/2019/8:30:43 PM    Final     Echocardiogram: IMPRESSIONS  1. The left ventricle has normal function. There is the interventricular  septum is flattened in systole and diastole, consistent with right  ventricular pressure and volume overload.  2. Right ventricular systolic function is moderately reduced. The right  ventricular size is moderately enlarged. There is severely elevated  pulmonary artery systolic pressure. The estimated right ventricular  systolic pressure is 58.3 mmHg.  3. Right atrial size was mildly dilated.  4. Tricuspid valve  regurgitation is moderate.  5. The aortic valve is grossly normal. Aortic valve regurgitation is not  visualized.  6. The inferior vena cava is normal in size with greater than 50%  respiratory variability, suggesting right atrial pressure of 3 mmHg.  7. Agitated saline contrast bubble study was negative, with no evidence  of any interatrial shunt.    Subjective: "I think I'm ready."  Discharge Exam: Vitals:   04/28/19 0406 04/28/19 0915  BP: 107/60 107/65  Pulse: 64 85  Resp: 16 (!) 22  Temp: 97.8 F (36.6 C) 97.6 F (36.4 C)  SpO2: 99% 94%   Vitals:   04/27/19 2100 04/28/19 0130 04/28/19 0406 04/28/19 0915  BP: 122/80 114/82 107/60 107/65  Pulse: 100 94 64 85  Resp: _0 (!) 22  Temp: 98.9 F (37.2 C) 98 F (36.7 C) 97.8 F (36.6 C) 97.6 F (36.4 C)  TempSrc: Oral Oral Oral Oral  SpO2: 90% 90% 99% 94%  Weight:      Height:        General: 49 y.o. female resting in bed in NAD Cardiovascular: RRR, +S1, S2, no m/g/r, equal pulses throughout Respiratory: soft b/l insp rhales, normal WOB, on 15L HFNC GI: BS+, NDNT, soft MSK: No e/c/c Neuro: alert to name, follows commands Psyc: Appropriate interaction and affect, calm/cooperative   The results of significant diagnostics from this hospitalization (including imaging, microbiology, ancillary and laboratory) are listed below for reference.     Microbiology:  Recent Results (from the past 240 hour(s))  SARS CORONAVIRUS 2 (TAT 6-24 HRS) Nasopharyngeal Nasopharyngeal Swab     Status: None   Collection Time: 04/23/19  4:46 PM   Specimen: Nasopharyngeal Swab  Result Value Ref Range Status   SARS Coronavirus 2 NEGATIVE NEGATIVE Final    Comment: (NOTE) SARS-CoV-2 target nucleic acids are NOT DETECTED. The SARS-CoV-2 RNA is generally detectable in upper and lower respiratory specimens during the acute phase of infection. Negative results do not preclude SARS-CoV-2 infection, do not rule out co-infections with  other pathogens, and should not be used as the sole basis for treatment or other patient management decisions. Negative results must be combined with clinical observations, patient history, and epidemiological information. The expected result is Negative. Fact Sheet for Patients: SugarRoll.be Fact Sheet for Healthcare Providers: https://www.woods-mathews.com/ This test is not yet approved or cleared by the [place] FDA and  has been authorized for detection and/or diagnosis of SARS-CoV-2 by FDA under an Emergency Use Authorization (EUA). This EUA will remain  in effect (meaning this test can be used) for the duration of the COVID-19 declaration under Section 56 4(b)(1) of the Act, 21 U.S.C. section 360bbb-3(b)(1), unless the authorization is terminated or revoked sooner. Performed at [facility], [place] [address], [place], [place] [address]   Culture, blood (routine x 2)     Status: None   Collection Time: 04/23/19  6:00 PM   Specimen: BLOOD RIGHT ARM  Result Value Ref Range Status   Specimen Description BLOOD RIGHT ARM  Final   Special Requests   Final    BOTTLES DRAWN AEROBIC AND ANAEROBIC Blood Culture adequate volume   Culture   Final    NO GROWTH 5 DAYS Performed at [facility], [place] [address] [place], [place] [address]    Report Status 04/28/2019 FINAL  Final  Culture, blood (routine x 2)     Status: None   Collection Time: 04/23/19  6:05 PM   Specimen: BLOOD RIGHT HAND  Result Value Ref Range Status   Specimen Description BLOOD RIGHT HAND  Final   Special Requests   Final    BOTTLES DRAWN AEROBIC AND ANAEROBIC Blood Culture adequate volume   Culture   Final    NO GROWTH 5 DAYS Performed at [facility], [place] [address], [place], [place] [address]    Report Status 04/28/2019 FINAL  Final  MRSA PCR Screening     Status: None   Collection Time: 04/26/19  6:00 AM   Specimen: Nasopharyngeal  Result Value  Ref Range Status   MRSA by PCR NEGATIVE NEGATIVE Final    Comment:        The GeneXpert MRSA Assay (FDA approved for NASAL specimens only), is one component of a comprehensive MRSA colonization surveillance program. It is not intended to diagnose MRSA infection nor to guide or monitor treatment for MRSA infections. Performed at [facility], [place] [address], [place], [place] [address]      Labs: BNP (last 3 results) Recent Labs    02/03/19 2336 02/22/19 2321 04/24/19 0530  BNP 718.1* 612.0* 4,034.7*   Basic Metabolic Panel: Recent Labs  Lab 04/24/19 0610 04/24/19 0610 04/25/19 0248 04/25/19 0248 04/26/19 0254 04/26/19 1322 04/26/19 1326 04/27/19 0254 04/28/19 0331  NA 141   < > 141   < > 137 138 137  138 134* 137  K 3.4*   < > 3.4*   < > 3.7 3.7  3.8  3.9 3.9 3.7  CL 99  --  97*  --  93*  --   --  93* 97*  CO2 28  --  29  --  28  --   --  27 29  GLUCOSE 118*  --  114*  --  309*  --   --  267* 230*  BUN 20  --  15  --  23*  --   --  25* 27*  CREATININE 0.77  --  0.86  --  1.07*  --   --  1.08* 0.92  CALCIUM 8.5*  --  8.5*  --  8.7*  --   --  8.8* 9.1  MG  --   --  2.2  --  1.9  --   --  2.2 2.1   < > = values in this interval not displayed.   Liver Function Tests: Recent Labs  Lab 04/23/19 1800 04/24/19 0610  AST 21 15  ALT 31 28  ALKPHOS 44 37*  BILITOT 0.5 0.6  PROT 6.1* 5.3*  ALBUMIN 3.2* 2.9*   No results for input(s): LIPASE, AMYLASE in the last 168 hours. No results for input(s): AMMONIA in the last 168 hours. CBC: Recent Labs  Lab 04/23/19 1800 04/23/19 1809 04/24/19 0610 04/24/19 0610 04/25/19 0248 04/26/19 0254 04/26/19 1322 04/26/19 1326 04/27/19 0254  WBC 8.4  --  7.2  --  8.0 6.1  --   --  12.4*  NEUTROABS 7.7  --  5.4  --  6.4 5.5  --   --  11.3*  HGB 12.7   < > 12.0   < > 13.8 12.8 15.0 15.0  15.0 12.3  HCT 44.2   < > 40.8   < > 47.1* 43.2 44.0 44.0  44.0 41.5  MCV 86.5  --  85.7  --  84.9 82.6  --   --  83.5  PLT 248   --  229  --  211 212  --   --  225   < > = values in this interval not displayed.   Cardiac Enzymes: No results for input(s): CKTOTAL, CKMB, CKMBINDEX, TROPONINI in the last 168 hours. BNP: Invalid input(s): POCBNP CBG: Recent Labs  Lab 04/27/19 0828 04/27/19 1247 04/27/19 1558 04/27/19 2109 04/28/19 0751  GLUCAP 269* 304* 307* 242* 182*   D-Dimer No results for input(s): DDIMER in the last 72 hours. Hgb A1c No results for input(s): HGBA1C in the last 72 hours. Lipid Profile No results for input(s): CHOL, HDL, LDLCALC, TRIG, CHOLHDL, LDLDIRECT in the last 72 hours. Thyroid function studies No results for input(s): TSH, T4TOTAL, T3FREE, THYROIDAB in the last 72 hours.  Invalid input(s): FREET3 Anemia work up No results for input(s): VITAMINB12, FOLATE, FERRITIN, TIBC, IRON, RETICCTPCT in the last 72 hours. Urinalysis    Component Value Date/Time   COLORURINE YELLOW 12/24/2008 1604   APPEARANCEUR CLOUDY (A) 12/24/2008 1604   LABSPEC 1.019 12/24/2008 1604   PHURINE 7.5 12/24/2008 1604   GLUCOSEU NEGATIVE 12/24/2008 1604   HGBUR NEGATIVE 12/24/2008 1604   BILIRUBINUR NEGATIVE 12/24/2008 [place] 12/24/2008 1604   PROTEINUR NEGATIVE 12/24/2008 1604   UROBILINOGEN 0.2 12/24/2008 1604   NITRITE NEGATIVE 12/24/2008 1604   LEUKOCYTESUR MODERATE (A) 12/24/2008 1604   Sepsis Labs Invalid input(s): PROCALCITONIN,  WBC,  LACTICIDVEN Microbiology Recent Results (from the past 240 hour(s))  SARS CORONAVIRUS 2 (TAT 6-24 HRS) Nasopharyngeal Nasopharyngeal Swab     Status: None  Collection Time: 04/23/19  4:46 PM   Specimen: Nasopharyngeal Swab  Result Value Ref Range Status   SARS Coronavirus 2 NEGATIVE NEGATIVE Final    Comment: (NOTE) SARS-CoV-2 target nucleic acids are NOT DETECTED. The SARS-CoV-2 RNA is generally detectable in upper and lower respiratory specimens during the acute phase of infection. Negative results do not preclude SARS-CoV-2  infection, do not rule out co-infections with other pathogens, and should not be used as the sole basis for treatment or other patient management decisions. Negative results must be combined with clinical observations, patient history, and epidemiological information. The expected result is Negative. Fact Sheet for Patients: SugarRoll.be Fact Sheet for Healthcare Providers: https://www.woods-mathews.com/ This test is not yet approved or cleared by the [place] FDA and  has been authorized for detection and/or diagnosis of SARS-CoV-2 by FDA under an Emergency Use Authorization (EUA). This EUA will remain  in effect (meaning this test can be used) for the duration of the COVID-19 declaration under Section 56 4(b)(1) of the Act, 21 U.S.C. section 360bbb-3(b)(1), unless the authorization is terminated or revoked sooner. Performed at [facility], [place] [address] [place], [place] [address]   Culture, blood (routine x 2)     Status: None   Collection Time: 04/23/19  6:00 PM   Specimen: BLOOD RIGHT ARM  Result Value Ref Range Status   Specimen Description BLOOD RIGHT ARM  Final   Special Requests   Final    BOTTLES DRAWN AEROBIC AND ANAEROBIC Blood Culture adequate volume   Culture   Final    NO GROWTH 5 DAYS Performed at [facility], [place] [address], [place], [place] [address]    Report Status 04/28/2019 FINAL  Final  Culture, blood (routine x 2)     Status: None   Collection Time: 04/23/19  6:05 PM   Specimen: BLOOD RIGHT HAND  Result Value Ref Range Status   Specimen Description BLOOD RIGHT HAND  Final   Special Requests   Final    BOTTLES DRAWN AEROBIC AND ANAEROBIC Blood Culture adequate volume   Culture   Final    NO GROWTH 5 DAYS Performed at [facility], [place] [address], [place], [place] [address]    Report Status 04/28/2019 FINAL  Final  MRSA PCR Screening     Status: None   Collection Time: 04/26/19  6:00  AM   Specimen: Nasopharyngeal  Result Value Ref Range Status   MRSA by PCR NEGATIVE NEGATIVE Final    Comment:        The GeneXpert MRSA Assay (FDA approved for NASAL specimens only), is one component of a comprehensive MRSA colonization surveillance program. It is not intended to diagnose MRSA infection nor to guide or monitor treatment for MRSA infections. Performed at [facility], [place] [address] [place], [place] [address]      Time coordinating discharge: 35 minutes  SIGNED:   Jonnie Finner, DO  [facility] 04/28/2019, 10:35 AM   If 7PM-7AM, please contact night-coverage [email or web address]

## 2019-04-30 ENCOUNTER — Telehealth: Payer: Self-pay | Admitting: Pulmonary Disease

## 2019-04-30 ENCOUNTER — Encounter: Payer: Self-pay | Admitting: *Deleted

## 2019-04-30 NOTE — Telephone Encounter (Signed)
Spoke with Melissa with [facility]. The pt was recently in the hospital and will need resumption of care with her physical therapy. They will need a verbal order from Dr. [name] for this.  Dr. [name] - please advise. Thanks.

## 2019-04-30 NOTE — Telephone Encounter (Signed)
Spoke with [facility]. Verbal order has been given to them for physical therapy. Nothing further was needed.

## 2019-04-30 NOTE — Telephone Encounter (Signed)
Message received from pt:  To: [facility] POOL    From: Felicia [name]    Created: 04/30/2019 4:49 PM     *-*-*This message was handled on 04/30/2019 5:10 PM by Myrle Sheng, Yuritzy Zehring P*-*-*  Dr. Ander [name] Medical Team, Hello. I just wanted to follow up on the status of the Tri-Pap (CPAP) machine inquiry with [facility]  Any feedback or update would be greatly appreciated.   Thanks in advance, Felicia [name]     I think what pt is talking about is trilogy vent as there are other encounters that are discussing that.  PCCs, is there any way you can look into this for [place].

## 2019-04-30 NOTE — Telephone Encounter (Signed)
Okay to resume physical therapy

## 2019-05-01 ENCOUNTER — Telehealth: Payer: Self-pay | Admitting: Pharmacy Technician

## 2019-05-01 NOTE — Telephone Encounter (Signed)
Melissa at [facility] is taking care of this.  Order was sent to her & confirmation received on 3/9.  I called her & left her a vm to call me back with status of order.

## 2019-05-01 NOTE — Telephone Encounter (Signed)
Melissa at [facility] left me a vm.  She states referral has been sent to [facility] for vent and they are waiting on a prior auth from them.  She states this can sometimes take a while.  She is going to see if she can get an update from them.  Will route message to triage so nurse can let pt know thru MyChart that [employer] is waiting on prior auth from insurance.

## 2019-05-01 NOTE — Telephone Encounter (Signed)
Called [employer] to check benefits for Rituxan infusions: [id number] and [id number]  Dx code: [phone] and J84.170  Patient has current authorization on file through [facility]. Will require a new prior authorization for new location.  Patient's plan is active. [facility] is in-network.  Plan pays 80% after deductible has been me and until [name] is met. Patient has met both her yearly deductible and Max OOP for 2021. So upon approval, infusion will be covered at 100%.  Prior authorization is done through [employer] rx- phone# [place]. C, advised that another peer to peer will be required for new authorization, to state why patient can not use plan preferred: Truxima and Ruxience.   Once decided, we can call back to initial prior auth and a clinical nurse will call office back to schedule Peer to Peer with the provider.  Ref# [id number] [employer]HC# [phone]  1:27 PM Werner Labella Delice Lesch, CPhT

## 2019-05-03 DIAGNOSIS — J849 Interstitial pulmonary disease, unspecified: Secondary | ICD-10-CM

## 2019-05-03 DIAGNOSIS — J9601 Acute respiratory failure with hypoxia: Secondary | ICD-10-CM

## 2019-05-03 NOTE — Telephone Encounter (Signed)
Received paper request for assistance with Rituxan infusion.  No specific dosing provided.  Rituxan is not an FDA approved treatment option for NSIP therefore does not provide specific dosing recommendations.  Review of the literature has varying dosing schedules for Rituxan for the treatment of NSIP.  Reviewed chart notes from [facility] where patient is currently receiving Rituxan infusions.  They do not list a specific dosing schedule.  Looking at past infusions it appears they are following dosing schedule for the treatment of rheumatoid arthritis: 1000 mg at week 0, 1000 mg at week 2, and then 1000 mg every 24 weeks.  It appears her last infusion may have been on 01/25/2019 putting her next infusion due around 07/12/2019 but we need to confirm with [facility] and/or patient.  Please see benefits investigation information above.  Insurance stated patient was receiving Rituxan at [facility] and current prior authorization is for Rituxan.  Insurance prefers biosimilars Truxima and Ruxience.  It appears at last infusion according to [facility] chart notes that patient was switched to Truxima from Rituxan.  If you feel patient needs Rituxan you will need to schedule a peer to peer and instructions are listed above.  If you would like to proceed with Truxima or Ruxience we will need to submit a new prior authorization.  Patient will be able to receive infusion at [facility] or [facility] short stay once approved.  Please advise on how you would like to proceed.   Mariella Saa, PharmD, [place], [place] Clinical Specialty Pharmacist [phone]  05/03/2019 10:22 AM

## 2019-05-03 NOTE — Telephone Encounter (Signed)
Dr. Olalere - please advise. Thanks. 

## 2019-05-03 NOTE — Telephone Encounter (Signed)
Dr. [name], Patient needs order for physical therapy. Per patients mychart message:  Dr. [name] and Team, Hi. I just had the visit with the Physical Therapy Aide. She did advise anything that I am hospitalized, they have to redo the initial assessment. After the session, she indicated that an order would have to be put in for me to approved for additional sessions.   I am eager to get started next week, but she cannot schedule without approval. If she calls from [place], would you be able to submit the orders/approval?  Just want to make sure I can start physical therapy without delay.  Thanks in advance, Felicia [name]

## 2019-05-05 NOTE — Telephone Encounter (Signed)
Okay to place an order for physical therapy-initial evaluation and additional sessions

## 2019-05-06 ENCOUNTER — Telehealth: Payer: Self-pay | Admitting: Pulmonary Disease

## 2019-05-06 NOTE — Telephone Encounter (Signed)
I called and spoke with [name] with [facility] and gave her a verbal order for PT and nurse eval. I will route to Dr. [name] as Juluis Rainier.

## 2019-05-06 NOTE — Telephone Encounter (Signed)
Okay with switch to [employer], thanks for your help

## 2019-05-15 ENCOUNTER — Telehealth: Payer: Self-pay | Admitting: Pulmonary Disease

## 2019-05-15 NOTE — Telephone Encounter (Signed)
Pt is needing her home nursing orders renewed.  Dr. [name] - please advise if you are okay with [place] giving a verbal order for this. Thanks.

## 2019-05-16 NOTE — Telephone Encounter (Signed)
Called and initiated Pre-certification for Trexima 179m infusions- [id number] 96413- 4 visits for 1 year  Dx Code- J96.11 (used for DThad Ranger and J84.113  Dosage 10089mevery 16-24 weeks. Requested start date of 06/24/19  [place] [id number]- phone# [phone] chart notes for authorization to # [id number] update once we receive a response.  11:31 AM RaBeatriz ChancellorCPhT

## 2019-05-16 NOTE — Telephone Encounter (Signed)
Dr. Jenetta Downer, please see pt's mychart message and advise on it.

## 2019-05-16 NOTE — Telephone Encounter (Signed)
Okay with verbal orders

## 2019-05-16 NOTE — Telephone Encounter (Signed)
Spoke with Vida Roller from [employer] and gave verbal order for nursing visits one day a week for 4 weeks. Nothing further needed at this time.

## 2019-05-17 ENCOUNTER — Telehealth: Payer: Self-pay | Admitting: Pulmonary Disease

## 2019-05-17 DIAGNOSIS — Z515 Encounter for palliative care: Secondary | ICD-10-CM

## 2019-05-17 NOTE — Telephone Encounter (Signed)
Dr. [name] please advise if you are ok with this and I will place this order.

## 2019-05-23 NOTE — Telephone Encounter (Signed)
Yes I am. 

## 2019-05-23 NOTE — Telephone Encounter (Signed)
Decrease prednisone dose to 30, then decrease by 10 mg every week until about 10 mg a day -If at any time while decreasing the dose of prednisone, starts to have more symptoms, go back to the previous dose where things were stable  Go back to usual dose of Lasix  Office appointment in about 2 to 3 weeks from now

## 2019-05-23 NOTE — Telephone Encounter (Signed)
LMTCB x1 for Felicia [name].

## 2019-05-24 NOTE — Telephone Encounter (Signed)
Spoke with Felicia [name] and advised her that AO will be ok with sending order. She took a verbal order and nothing further is needed.

## 2019-05-29 ENCOUNTER — Telehealth: Payer: Self-pay

## 2019-05-29 NOTE — Telephone Encounter (Signed)
I called pt but she didn't answer. No VM to leave message. Will try again later.

## 2019-05-29 NOTE — Telephone Encounter (Signed)
Called pt and advised message from the provider. Pt understood and verbalized understanding. Nothing further is needed.   Pt stated she would go to the hospital if she gets worse.

## 2019-05-29 NOTE — Telephone Encounter (Signed)
Patient is returning phone call. Patient phone number is [phone].

## 2019-05-29 NOTE — Telephone Encounter (Signed)
Spoke with Claiborne Billings with [facility] and her saturations are dropping on 18L oxygen, to high 70s, low 80's. I advised her that she would need to go to the hospital but pt is refusing to go. She has been coughing up green/brown mucus. She has been using Nyquil and claritin for her symptoms. Pt seems very drowsy but it could be from the Nyquil. She thinks pt is dehydrated and prescribed a fluid pill but has not urinated much. Claiborne Billings wants to know if there is anything we can do for pt?

## 2019-05-29 NOTE — Telephone Encounter (Signed)
Going to the hospital may still be the best way to manage this  Being on very high oxygen flows means there is not much you can add to bed at home.  Being on water pills means your blood electrolytes can change pretty quickly especially if you are not able to eat or drink much.  Also do not have much breathing room if you were to develop an infection/ no reserves.  I will recommend to go to the ED   A lot may change quickly for somebody with as limited reserves as Felicia [name] has.

## 2019-05-30 ENCOUNTER — Inpatient Hospital Stay (HOSPITAL_COMMUNITY)
Admission: EM | Admit: 2019-05-30 | Discharge: 2019-06-04 | DRG: 291 | Disposition: A | Discharge status: Home Health | Payer: 59 | Attending: Internal Medicine | Admitting: Internal Medicine

## 2019-05-30 ENCOUNTER — Emergency Department (HOSPITAL_COMMUNITY): Payer: 59

## 2019-05-30 ENCOUNTER — Other Ambulatory Visit: Payer: Self-pay

## 2019-05-30 ENCOUNTER — Encounter (HOSPITAL_COMMUNITY): Payer: Self-pay

## 2019-05-30 DIAGNOSIS — Z794 Long term (current) use of insulin: Secondary | ICD-10-CM | POA: Diagnosis not present

## 2019-05-30 DIAGNOSIS — E1165 Type 2 diabetes mellitus with hyperglycemia: Secondary | ICD-10-CM | POA: Diagnosis present

## 2019-05-30 DIAGNOSIS — I2781 Cor pulmonale (chronic): Secondary | ICD-10-CM | POA: Diagnosis present

## 2019-05-30 DIAGNOSIS — Z810 Family history of intellectual disabilities: Secondary | ICD-10-CM

## 2019-05-30 DIAGNOSIS — E876 Hypokalemia: Secondary | ICD-10-CM | POA: Diagnosis present

## 2019-05-30 DIAGNOSIS — J849 Interstitial pulmonary disease, unspecified: Secondary | ICD-10-CM | POA: Diagnosis not present

## 2019-05-30 DIAGNOSIS — I10 Essential (primary) hypertension: Secondary | ICD-10-CM | POA: Diagnosis not present

## 2019-05-30 DIAGNOSIS — E6609 Other obesity due to excess calories: Secondary | ICD-10-CM

## 2019-05-30 DIAGNOSIS — M792 Neuralgia and neuritis, unspecified: Secondary | ICD-10-CM | POA: Diagnosis present

## 2019-05-30 DIAGNOSIS — T502X5A Adverse effect of carbonic-anhydrase inhibitors, benzothiadiazides and other diuretics, initial encounter: Secondary | ICD-10-CM | POA: Diagnosis present

## 2019-05-30 DIAGNOSIS — Z86718 Personal history of other venous thrombosis and embolism: Secondary | ICD-10-CM

## 2019-05-30 DIAGNOSIS — Z7901 Long term (current) use of anticoagulants: Secondary | ICD-10-CM | POA: Diagnosis not present

## 2019-05-30 DIAGNOSIS — Z20822 Contact with and (suspected) exposure to covid-19: Secondary | ICD-10-CM | POA: Diagnosis present

## 2019-05-30 DIAGNOSIS — Z8249 Family history of ischemic heart disease and other diseases of the circulatory system: Secondary | ICD-10-CM | POA: Diagnosis not present

## 2019-05-30 DIAGNOSIS — Z6835 Body mass index (BMI) 35.0-35.9, adult: Secondary | ICD-10-CM | POA: Diagnosis not present

## 2019-05-30 DIAGNOSIS — E873 Alkalosis: Secondary | ICD-10-CM | POA: Diagnosis not present

## 2019-05-30 DIAGNOSIS — I5033 Acute on chronic diastolic (congestive) heart failure: Secondary | ICD-10-CM | POA: Diagnosis present

## 2019-05-30 DIAGNOSIS — Z833 Family history of diabetes mellitus: Secondary | ICD-10-CM | POA: Diagnosis not present

## 2019-05-30 DIAGNOSIS — Z79899 Other long term (current) drug therapy: Secondary | ICD-10-CM

## 2019-05-30 DIAGNOSIS — I2721 Secondary pulmonary arterial hypertension: Secondary | ICD-10-CM | POA: Diagnosis present

## 2019-05-30 DIAGNOSIS — I110 Hypertensive heart disease with heart failure: Principal | ICD-10-CM | POA: Diagnosis present

## 2019-05-30 DIAGNOSIS — I471 Supraventricular tachycardia: Secondary | ICD-10-CM | POA: Diagnosis present

## 2019-05-30 DIAGNOSIS — I272 Pulmonary hypertension, unspecified: Secondary | ICD-10-CM | POA: Diagnosis not present

## 2019-05-30 DIAGNOSIS — J8489 Other specified interstitial pulmonary diseases: Secondary | ICD-10-CM | POA: Diagnosis present

## 2019-05-30 DIAGNOSIS — R0902 Hypoxemia: Secondary | ICD-10-CM

## 2019-05-30 DIAGNOSIS — J9621 Acute and chronic respiratory failure with hypoxia: Secondary | ICD-10-CM | POA: Diagnosis present

## 2019-05-30 DIAGNOSIS — R9431 Abnormal electrocardiogram [ECG] [EKG]: Secondary | ICD-10-CM | POA: Diagnosis not present

## 2019-05-30 LAB — RESPIRATORY PANEL BY RT PCR (FLU A&B, COVID)
Influenza A by PCR: NEGATIVE
Influenza B by PCR: NEGATIVE
SARS Coronavirus 2 by RT PCR: NEGATIVE

## 2019-05-30 LAB — COMPREHENSIVE METABOLIC PANEL
ALT: 28 U/L (ref 0–44)
AST: 23 U/L (ref 15–41)
Albumin: 3.2 g/dL — ABNORMAL LOW (ref 3.5–5.0)
Alkaline Phosphatase: 42 U/L (ref 38–126)
Anion gap: 13 (ref 5–15)
BUN: 7 mg/dL (ref 6–20)
CO2: 31 mmol/L (ref 22–32)
Calcium: 7.9 mg/dL — ABNORMAL LOW (ref 8.9–10.3)
Chloride: 96 mmol/L — ABNORMAL LOW (ref 98–111)
Creatinine, Ser: 0.79 mg/dL (ref 0.44–1.00)
GFR calc Af Amer: >60 mL/min (ref >60)
GFR calc non Af Amer: >60 mL/min (ref >60)
Glucose, Bld: 190 mg/dL — ABNORMAL HIGH (ref 70–99)
Potassium: 2.5 mmol/L — CL (ref 3.5–5.1)
Sodium: 140 mmol/L (ref 135–145)
Total Bilirubin: 0.7 mg/dL (ref 0.3–1.2)
Total Protein: 5.6 g/dL — ABNORMAL LOW (ref 6.5–8.1)

## 2019-05-30 LAB — CBC WITH DIFFERENTIAL/PLATELET
Abs Immature Granulocytes: 0.06 10*3/uL (ref 0.00–0.07)
Basophils Absolute: 0 10*3/uL (ref 0.0–0.1)
Basophils Relative: 0 %
Eosinophils Absolute: 0 10*3/uL (ref 0.0–0.5)
Eosinophils Relative: 0 %
HCT: 41.8 % (ref 36.0–46.0)
Hemoglobin: 12.1 g/dL (ref 12.0–15.0)
Immature Granulocytes: 1 %
Lymphocytes Relative: 3 %
Lymphs Abs: 0.3 10*3/uL — ABNORMAL LOW (ref 0.7–4.0)
MCH: 26.4 pg (ref 26.0–34.0)
MCHC: 28.9 g/dL — ABNORMAL LOW (ref 30.0–36.0)
MCV: 91.1 fL (ref 80.0–100.0)
Monocytes Absolute: 0.3 10*3/uL (ref 0.1–1.0)
Monocytes Relative: 3 %
Neutro Abs: 10.6 10*3/uL — ABNORMAL HIGH (ref 1.7–7.7)
Neutrophils Relative %: 93 %
Platelets: 307 10*3/uL (ref 150–400)
RBC: 4.59 MIL/uL (ref 3.87–5.11)
RDW: 19.9 % — ABNORMAL HIGH (ref 11.5–15.5)
WBC: 11.4 10*3/uL — ABNORMAL HIGH (ref 4.0–10.5)
nRBC: 0.6 % — ABNORMAL HIGH (ref 0.0–0.2)

## 2019-05-30 LAB — I-STAT BETA HCG BLOOD, ED (MC, WL, AP ONLY): I-stat hCG, quantitative: <5 m[IU]/mL (ref <5)

## 2019-05-30 LAB — POC SARS CORONAVIRUS 2 AG -  ED: SARS Coronavirus 2 Ag: NEGATIVE

## 2019-05-30 MED ORDER — FUROSEMIDE 10 MG/ML IJ SOLN
20.0000 mg | Freq: Once | INTRAMUSCULAR | Status: AC
  Administered 2019-05-31: 20 mg via INTRAVENOUS
  Filled 2019-05-30: qty 2

## 2019-05-30 MED ORDER — POTASSIUM CHLORIDE CRYS ER 20 MEQ PO TBCR
40.0000 meq | EXTENDED_RELEASE_TABLET | Freq: Once | ORAL | Status: AC
  Administered 2019-05-30: 22:00:00 40 meq via ORAL
  Filled 2019-05-30: qty 2

## 2019-05-30 MED ORDER — POTASSIUM CHLORIDE 10 MEQ/100ML IV SOLN
10.0000 meq | INTRAVENOUS | Status: AC
  Administered 2019-05-30 – 2019-05-31 (×3): 10 meq via INTRAVENOUS
  Filled 2019-05-30 (×2): qty 100

## 2019-05-30 NOTE — ED Provider Notes (Signed)
[facility] EMERGENCY DEPARTMENT Provider Note   CSN: [id number] Arrival date & time: 05/30/19  [place]     History Chief Complaint  Patient presents with  . Shortness of Breath    Felicia [name] is a 49 y.o. female.  HPI She reports shortness of breath for several days, using oxygen and albuterol nebulizer without relief.  She denies fever.  She has been coughing producing sputum that is discolored but does not have blood in it.  She states she has had a single of 2 planned Covid vaccines.  No known sick contacts.  EMS was called to her home and found her to be in respiratory distress on her nasal cannula, at 15 L/min, her normal.  Patient reports nasal congestion recently which may be impairing her ability to oxygenate.  Home health nurse was at her home yesterday, contacted her pulmonologist who recommended that she come to the ED.  It was reported that she had oxygen saturations in the 70s and 80s at that time.  She was hospitalized 04/23/2019, was treated for acute respiratory failure, interstitial pneumonitis, and community-acquired pneumonia.  She also required diuresis to improve her respiratory status.  Patient states she was on a higher dose of Lasix, last week and this week her neurologist decrease it to 30 mg.  She is taking potassium, daily.  There are no other known modifying factors.    Past Medical History:  Diagnosis Date  . Diabetes mellitus without complication ([place])    type 2   . Hypertension   . NSIP (nonspecific interstitial pneumonitis) ([place])   . SVT (supraventricular tachycardia) [facility])     Patient Active Problem List   Diagnosis Date Noted  . Shingles 04/24/2019  . Acute respiratory failure ([place]) 04/23/2019  . Chest pain   . Acute respiratory failure with hypoxemia ([place]) 02/23/2019  . CAP (community acquired pneumonia) 03/31/2018  . Interstitial lung disease ([place]) 03/27/2018  . Palpitations 03/20/2018  . Chronic diastolic HF (heart  failure) ([place]) 03/21/2017  . Pulmonary HTN ([place]) 03/21/2017  . Medication management 03/21/2017  . NSIP (nonspecific interstitial pneumonitis) ([place]) 02/03/2017  . Acute on chronic respiratory failure with hypoxemia ([place]) 02/03/2017  . Essential hypertension 02/03/2017  . Dyspnea on exertion   . On home oxygen therapy   . PSVT (paroxysmal supraventricular tachycardia) ([place]) 09/23/2014  . Interstitial pneumonitis ([place]) 09/23/2014  . UNSPEC ALVEOLAR&PARIETOALVEOLAR PNEUMONOPATHY 09/12/2008  . HYPERTENSION 09/11/2008  . BRONCHITIS 09/11/2008    Past Surgical History:  Procedure Laterality Date  . CESAREAN SECTION    . LUNG BIOPSY    . RIGHT HEART CATH N/A 04/26/2019   Procedure: RIGHT HEART CATH;  Surgeon: Larey Dresser, MD;  Location: [facility];  Service: Cardiovascular;  Laterality: N/A;     OB History   No obstetric history on file.     Family History  Problem Relation Age of Onset  . Hypertension Mother   . Cancer Father   . Diabetes Father   . Hypertension Father   . Diabetes Sister   . Mental retardation Sister   . Cancer Maternal Grandmother   . Cancer Maternal Grandfather   . Cancer Paternal Grandmother   . Cancer Paternal Grandfather     Social History   Tobacco Use  . Smoking status: Never Smoker  . Smokeless tobacco: Never Used  . Tobacco comment: never smoked or used tobacco products  Substance Use Topics  . Alcohol use: No  . Drug use: No  Home Medications Prior to Admission medications   Medication Sig Start Date End Date Taking? Authorizing Provider  acetaminophen (TYLENOL) 500 MG tablet Take 2,000 mg by mouth as needed for moderate pain.    Yes [provider]  albuterol (PROVENTIL HFA;VENTOLIN HFA) 108 (90 Base) MCG/ACT inhaler Inhale 1 puff into the lungs every 6 (six) hours as needed for wheezing or shortness of breath.   Yes [provider]  apixaban (ELIQUIS) 5 MG TABS tablet Take 1 tablet (5 mg total) by mouth  2 (two) times daily. 03/12/19 05/30/19 Yes Olalere, Adewale A, MD  atenolol (TENORMIN) 50 MG tablet Take 75 mg by mouth 2 (two) times daily.  01/08/17  Yes [provider]  cholecalciferol (VITAMIN D3) 25 MCG (1000 UT) tablet Take 1,000 Units by mouth daily.   Yes [provider]  furosemide (LASIX) 20 MG tablet Take 3 tablets (60 mg total) by mouth 2 (two) times daily. 04/28/19 06/27/19 Yes Kyle, Tyrone A, DO  gabapentin (NEURONTIN) 300 MG capsule Take 1 capsule (300 mg total) by mouth 3 (three) times daily for 7 days. 04/28/19 05/30/19 Yes Kyle, Tyrone A, DO  guaiFENesin (MUCINEX) 600 MG 12 hr tablet Take 600 mg by mouth daily.    Yes [provider]  insulin degludec (TRESIBA FLEXTOUCH) 100 UNIT/ML FlexTouch Pen Inject 24 Units into the skin daily.   Yes [provider]  insulin lispro (HUMALOG KWIKPEN) 100 UNIT/ML KwikPen Inject 0-20 Units into the skin 2 (two) times daily. Sliding scale based on sugar level   Yes [provider]  ipratropium-albuterol (DUONEB) 0.5-2.5 (3) MG/3ML SOLN Inhale 3 mLs into the lungs every 6 (six) hours as needed (sob and wheezing).  01/08/17  Yes [provider]  metFORMIN (GLUCOPHAGE-XR) 500 MG 24 hr tablet Take 1,000 mg by mouth at bedtime.  04/10/19  Yes [provider]  mycophenolate (CELLCEPT) 500 MG tablet Take 1,500 mg by mouth 2 (two) times daily.    Yes [provider]  naproxen sodium (ALEVE) 220 MG tablet Take 440 mg by mouth as needed (pain).   Yes [provider]  potassium chloride SA (KLOR-CON) 20 MEQ tablet Take 2 tablets (40 mEq total) by mouth daily. 04/29/19 05/30/19 Yes Kyle, Tyrone A, DO  predniSONE (DELTASONE) 10 MG tablet Take 40 mg by mouth daily with breakfast.   Yes [provider]  sildenafil (REVATIO) 20 MG tablet Take 20 mg by mouth 2 (two) times daily. 04/15/19  Yes [provider]  sulfamethoxazole-trimethoprim (BACTRIM DS) 800-160 MG tablet Take 1 tablet  by mouth 3 (three) times a week. 12/24/18  Yes [provider]  verapamil (CALAN-SR) 120 MG CR tablet Take 1 tablet (120 mg total) by mouth at bedtime. 09/23/14  Yes Etta Quill, NP    Allergies    Patient has no known allergies.  Review of Systems   Review of Systems  All other systems reviewed and are negative.   Physical Exam Updated Vital Signs BP 108/74   Pulse (!) 106   Temp 99.2 F (37.3 C) (Oral)   Resp (!) 31   Ht _0  (1.651 m)   Wt 95.7 kg   SpO2 94%   BMI 35.11 kg/m   Physical Exam Vitals and nursing note reviewed.  Constitutional:      General: She is not in acute distress.    Appearance: She is well-developed. She is obese. She is ill-appearing. She is not toxic-appearing or diaphoretic.  HENT:  Head: Normocephalic and atraumatic.  Eyes:     Conjunctiva/sclera: Conjunctivae normal.     Pupils: Pupils are equal, round, and reactive to light.  Neck:     Trachea: Phonation normal.  Cardiovascular:     Rate and Rhythm: Normal rate and regular rhythm.  Pulmonary:     Effort: Pulmonary effort is normal. No respiratory distress.     Breath sounds: No stridor. Wheezing (Right midlung, mild) present. No rhonchi.  Chest:     Chest wall: No tenderness.  Abdominal:     General: There is no distension.     Palpations: Abdomen is soft.     Tenderness: There is no abdominal tenderness. There is no guarding.  Musculoskeletal:        General: No tenderness. Normal range of motion.     Cervical back: Normal range of motion and neck supple.     Right lower leg: No edema.     Left lower leg: No edema.  Skin:    General: Skin is warm and dry.  Neurological:     Mental Status: She is alert and oriented to person, place, and time.     Motor: No abnormal muscle tone.  Psychiatric:        Mood and Affect: Mood normal.        Behavior: Behavior normal.        Thought Content: Thought content normal.        Judgment: Judgment normal.     ED Results /  Procedures / Treatments   Labs (all labs ordered are listed, but only abnormal results are displayed) Labs Reviewed  COMPREHENSIVE METABOLIC PANEL - Abnormal; Notable for the following components:      Result Value   Potassium 2.5 (*)    Chloride 96 (*)    Glucose, Bld 190 (*)    Calcium 7.9 (*)    Total Protein 5.6 (*)    Albumin 3.2 (*)    All other components within normal limits  CBC WITH DIFFERENTIAL/PLATELET - Abnormal; Notable for the following components:   WBC 11.4 (*)    MCHC 28.9 (*)    RDW 19.9 (*)    nRBC 0.6 (*)    Neutro Abs 10.6 (*)    Lymphs Abs 0.3 (*)    All other components within normal limits  RESPIRATORY PANEL BY RT PCR (FLU A&B, COVID)  BRAIN NATRIURETIC PEPTIDE  MAGNESIUM  POC SARS CORONAVIRUS 2 AG -  ED  I-STAT BETA HCG BLOOD, ED ([facility], [facility], AP ONLY)    EKG EKG Interpretation  Date/Time:  Thursday May 30 2019 18:37:01 EDT Ventricular Rate:  106 PR Interval:    QRS Duration: 84 QT Interval:  418 QTC Calculation: 523 R Axis:   120 Text Interpretation: Sinus tachycardia Probable right ventricular hypertrophy Non-specific ST-t changes Prolonged QT interval Since last tracing rate faster Baseline wander QT has lengthened Otherwise no significant change Confirmed by Daleen Bo [phone]) on 05/30/2019 8:10:56 PM   Radiology DG Chest Port 1 View  Result Date: 05/30/2019 CLINICAL DATA:  Shortness of breath EXAM: PORTABLE CHEST 1 VIEW COMPARISON:  04/23/2019 FINDINGS: Moderate diffuse interstitial opacity, unchanged. This is consistent with known interstitial lung disease. No pleural effusion or pneumothorax. No focal airspace consolidation. IMPRESSION: Unchanged appearance of interstitial lung disease. Electronically Signed   By: Ulyses Jarred M.D.   On: 05/30/2019 19:40    Procedures .Critical Care Performed by: Daleen Bo, MD Authorized by: Daleen Bo, MD   Critical care  provider statement:    Critical care time (minutes):  35    Critical care start time:  05/30/2019 6:45 PM   Critical care time was exclusive of:  Separately billable procedures and treating other patients   Critical care was necessary to treat or prevent imminent or life-threatening deterioration of the following conditions:  Respiratory failure   Critical care was time spent personally by me on the following activities:  Blood draw for specimens, development of treatment plan with patient or surrogate, discussions with consultants, evaluation of patient's response to treatment, examination of patient, obtaining history from patient or surrogate, ordering and performing treatments and interventions, ordering and review of laboratory studies, pulse oximetry, re-evaluation of patient's condition, review of old charts and ordering and review of radiographic studies   (including critical care time)  Medications Ordered in ED Medications  potassium chloride 10 mEq in 100 mL IVPB (10 mEq Intravenous New Bag/Given 05/30/19 2220)  furosemide (LASIX) injection 20 mg (has no administration in time range)  potassium chloride SA (KLOR-CON) CR tablet 40 mEq (40 mEq Oral Given 05/30/19 2215)    ED Course  I have reviewed the triage vital signs and the nursing notes.  Pertinent labs & imaging results that were available during my care of the patient were reviewed by me and considered in my medical decision making (see chart for details).  Clinical Course as of May 29 2336  Thu May 30, 2019  2008 Consistent with ongoing interstitial lung disease, without significant change, interpreted by me  DG Chest [facility] [EW]  2257 Consult intensivist, by phone.  He recommends admit to medical service, gentle diuresis with Lasix 20 mg, continue oxygen by facemask, for now.   [EW]  2301 Normal except white count slightly elevated  CBC with Differential(!) [EW]  2303 Normal except potassium low, chloride low, glucose high, calcium low, total protein low, albumin low    Comprehensive metabolic panel(!!) [EW]  5573 Normal  Respiratory Panel by RT PCR (Flu A&B, Covid) - Nasopharyngeal Swab [EW]  2336 Creatinine: 0.79 [EW]    Clinical Course User Index [EW] Daleen Bo, MD   MDM Rules/Calculators/A&P                       Patient Vitals for the past 24 hrs:  BP Temp Temp src Pulse Resp SpO2 Height Weight  05/30/19 2300 108/74 -- -- (!) 106 (!) 31 94 % -- --  05/30/19 2215 (!) 108/94 -- -- 98 (!) 27 (!) 88 % -- --  05/30/19 2115 115/83 -- -- 100 (!) 35 94 % -- --  05/30/19 2045 123/81 -- -- 63 (!) 33 91 % -- --  05/30/19 2030 121/88 -- -- 96 (!) 34 93 % -- --  05/30/19 2015 118/82 -- -- 60 (!) 36 96 % -- --  05/30/19 2000 121/89 -- -- 93 (!) 34 94 % -- --  05/30/19 1945 115/84 -- -- 96 (!) 38 92 % -- --  05/30/19 1930 113/63 -- -- 96 (!) 26 96 % -- --  05/30/19 1915 116/87 -- -- 94 (!) 35 94 % -- --  05/30/19 1900 117/87 -- -- 98 (!) 34 97 % -- --  05/30/19 1845 (!) 122/92 -- -- (!) 102 (!) 35 92 % _0  (1.651 m) 95.7 kg  05/30/19 1844 134/84 99.2 F (37.3 C) Oral (!) 102 (!) 34 94 % -- --  05/30/19 1843 -- -- -- -- -- 92 % -- --  11:02 PM Reevaluation with update and discussion. After initial assessment and treatment, an updated evaluation reveals she is more comfortable at this time and has no further complaints. Daleen Bo   Medical Decision Making:  This patient is presenting for evaluation of shortness of breath with hypoxia, which does require a range of treatment options, and is a complaint that involves a high risk of morbidity and mortality. The differential diagnoses include pneumonia, exacerbation of chronic lung disease, recurrent pneumonitis, heart failure. I decided  to review old records, and in summary chronically ill patient on high flow nasal cannula oxygen 15 L at home.  Today she was hypoxic on nasal cannula oxygen on arrival of EMS. I did not require additional historical information from anyone. Clinical Laboratory  Tests Ordered, included CBC, c-Met, Covid antigen testing, Covid PCR testing.  Patient with mild elevation white blood cell count.  Chemistry panel indicates no potassium, low chloride, high glucose.  Covid antigen testing negative.  Covid PCR testing normal Radiologic Tests Ordered, included chest x-ray. I independently Visualized: Radiographic images, which show chronic interstitial lung disease without evident infiltrate; Cardiac Monitor Tracing which shows normal sinus rhythm.   Critical Interventions-oxygen by facemask, to improve saturation to greater than 90%.  After These Interventions, the Patient was reevaluated and was found improved, she states she feels better.  Patient found to be hypokalemic, likely related to recent use of Lasix during respiratory illness.  CRITICAL CARE- yes Performed by: Daleen Bo   Nursing Notes Reviewed/ Care Coordinated Applicable Imaging Reviewed Interpretation of Laboratory Data incorporated into ED treatment   11:39 PM-Consult complete with hospitalist. Patient case explained and discussed.  He agrees to admit patient for further evaluation and treatment. Call ended at 11:48 PM  Plan: Admit   Final Clinical Impression(s) / ED Diagnoses Final diagnoses:  Prolonged Q-T interval on ECG    Rx / DC Orders ED Discharge Orders    None       Daleen Bo, MD 05/31/19 0028

## 2019-05-30 NOTE — ED Triage Notes (Signed)
Pt BIB [facility] from home w/ c/o SOB, dry cough, diarrhea. Pt on 15L [place] at baseline, found w/ O2 60%, CPAP attempted by EMS, pt not tolerating, placed on 15 L NRB. Pt hx CHF, pulmonary fibrosis. O2 96% on 15 L NRB, RR 36, HR 102, 125/99. Pt tachypneic, accessory muscle use noted, A&Ox4.

## 2019-05-31 ENCOUNTER — Encounter (HOSPITAL_COMMUNITY): Payer: Self-pay | Admitting: Family Medicine

## 2019-05-31 DIAGNOSIS — I10 Essential (primary) hypertension: Secondary | ICD-10-CM

## 2019-05-31 DIAGNOSIS — I5033 Acute on chronic diastolic (congestive) heart failure: Secondary | ICD-10-CM

## 2019-05-31 DIAGNOSIS — J9621 Acute and chronic respiratory failure with hypoxia: Secondary | ICD-10-CM

## 2019-05-31 DIAGNOSIS — Z86718 Personal history of other venous thrombosis and embolism: Secondary | ICD-10-CM

## 2019-05-31 DIAGNOSIS — I272 Pulmonary hypertension, unspecified: Secondary | ICD-10-CM

## 2019-05-31 DIAGNOSIS — R9431 Abnormal electrocardiogram [ECG] [EKG]: Secondary | ICD-10-CM | POA: Insufficient documentation

## 2019-05-31 DIAGNOSIS — E876 Hypokalemia: Secondary | ICD-10-CM

## 2019-05-31 DIAGNOSIS — J849 Interstitial pulmonary disease, unspecified: Secondary | ICD-10-CM

## 2019-05-31 DIAGNOSIS — J9691 Respiratory failure, unspecified with hypoxia: Secondary | ICD-10-CM

## 2019-05-31 HISTORY — DX: Respiratory failure, unspecified with hypoxia: J96.91

## 2019-05-31 LAB — CBC WITH DIFFERENTIAL/PLATELET
Abs Immature Granulocytes: 0.04 10*3/uL (ref 0.00–0.07)
Basophils Absolute: 0 10*3/uL (ref 0.0–0.1)
Basophils Relative: 0 %
Eosinophils Absolute: 0 10*3/uL (ref 0.0–0.5)
Eosinophils Relative: 0 %
HCT: 41.3 % (ref 36.0–46.0)
Hemoglobin: 12.1 g/dL (ref 12.0–15.0)
Immature Granulocytes: 0 %
Lymphocytes Relative: 7 %
Lymphs Abs: 0.6 10*3/uL — ABNORMAL LOW (ref 0.7–4.0)
MCH: 26.3 pg (ref 26.0–34.0)
MCHC: 29.3 g/dL — ABNORMAL LOW (ref 30.0–36.0)
MCV: 89.8 fL (ref 80.0–100.0)
Monocytes Absolute: 0.7 10*3/uL (ref 0.1–1.0)
Monocytes Relative: 8 %
Neutro Abs: 7.6 10*3/uL (ref 1.7–7.7)
Neutrophils Relative %: 85 %
Platelets: 332 10*3/uL (ref 150–400)
RBC: 4.6 MIL/uL (ref 3.87–5.11)
RDW: 20 % — ABNORMAL HIGH (ref 11.5–15.5)
WBC: 9 10*3/uL (ref 4.0–10.5)
nRBC: 0.8 % — ABNORMAL HIGH (ref 0.0–0.2)

## 2019-05-31 LAB — BASIC METABOLIC PANEL
Anion gap: 15 (ref 5–15)
BUN: 10 mg/dL (ref 6–20)
CO2: 29 mmol/L (ref 22–32)
Calcium: 8.3 mg/dL — ABNORMAL LOW (ref 8.9–10.3)
Chloride: 100 mmol/L (ref 98–111)
Creatinine, Ser: 0.88 mg/dL (ref 0.44–1.00)
GFR calc Af Amer: >60 mL/min (ref >60)
GFR calc non Af Amer: >60 mL/min (ref >60)
Glucose, Bld: 166 mg/dL — ABNORMAL HIGH (ref 70–99)
Potassium: 3.8 mmol/L (ref 3.5–5.1)
Sodium: 144 mmol/L (ref 135–145)

## 2019-05-31 LAB — HEMOGLOBIN A1C
Hgb A1c MFr Bld: 8.8 % — ABNORMAL HIGH (ref 4.8–5.6)
Mean Plasma Glucose: 205.86 mg/dL

## 2019-05-31 LAB — MAGNESIUM
Magnesium: 1.3 mg/dL — ABNORMAL LOW (ref 1.7–2.4)
Magnesium: 1.5 mg/dL — ABNORMAL LOW (ref 1.7–2.4)

## 2019-05-31 LAB — GLUCOSE, CAPILLARY
Glucose-Capillary: 223 mg/dL — ABNORMAL HIGH (ref 70–99)
Glucose-Capillary: 222 mg/dL — ABNORMAL HIGH (ref 70–99)

## 2019-05-31 LAB — CBG MONITORING, ED
Glucose-Capillary: 132 mg/dL — ABNORMAL HIGH (ref 70–99)
Glucose-Capillary: 176 mg/dL — ABNORMAL HIGH (ref 70–99)

## 2019-05-31 LAB — PROCALCITONIN: Procalcitonin: <0.1 ng/mL

## 2019-05-31 LAB — BRAIN NATRIURETIC PEPTIDE: B Natriuretic Peptide: 1085.4 pg/mL — ABNORMAL HIGH (ref 0.0–100.0)

## 2019-05-31 MED ORDER — SILDENAFIL CITRATE 20 MG PO TABS
20.0000 mg | ORAL_TABLET | Freq: Two times a day (BID) | ORAL | Status: DC
  Administered 2019-05-31 – 2019-06-03 (×7): 20 mg via ORAL
  Filled 2019-05-31 (×8): qty 1

## 2019-05-31 MED ORDER — ACETAMINOPHEN 325 MG PO TABS: 650.0000 mg | ORAL_TABLET | Freq: Four times a day (QID) | ORAL | Status: DC | PRN

## 2019-05-31 MED ORDER — ALBUTEROL SULFATE (2.5 MG/3ML) 0.083% IN NEBU: 2.5000 mg | INHALATION_SOLUTION | RESPIRATORY_TRACT | Status: DC | PRN

## 2019-05-31 MED ORDER — METHYLPREDNISOLONE SODIUM SUCC 125 MG IJ SOLR
60.0000 mg | Freq: Two times a day (BID) | INTRAMUSCULAR | Status: DC
  Administered 2019-05-31 – 2019-06-02 (×4): 60 mg via INTRAVENOUS
  Filled 2019-05-31 (×4): qty 2

## 2019-05-31 MED ORDER — GABAPENTIN 300 MG PO CAPS
300.0000 mg | ORAL_CAPSULE | Freq: Three times a day (TID) | ORAL | Status: DC
  Administered 2019-05-31 – 2019-06-04 (×13): 300 mg via ORAL
  Filled 2019-05-31 (×13): qty 1

## 2019-05-31 MED ORDER — MYCOPHENOLATE MOFETIL 250 MG PO CAPS
1500.0000 mg | ORAL_CAPSULE | Freq: Two times a day (BID) | ORAL | Status: DC
  Administered 2019-05-31 – 2019-06-04 (×9): 1500 mg via ORAL
  Filled 2019-05-31 (×10): qty 6

## 2019-05-31 MED ORDER — SODIUM CHLORIDE 0.9 % IV SOLN: 250.0000 mL | INTRAVENOUS | Status: DC | PRN

## 2019-05-31 MED ORDER — ACETAMINOPHEN 650 MG RE SUPP: 650.0000 mg | Freq: Four times a day (QID) | RECTAL | Status: DC | PRN

## 2019-05-31 MED ORDER — POTASSIUM CHLORIDE 10 MEQ/100ML IV SOLN
10.0000 meq | Freq: Once | INTRAVENOUS | Status: AC
  Administered 2019-05-31: 06:00:00 10 meq via INTRAVENOUS

## 2019-05-31 MED ORDER — INSULIN ASPART 100 UNIT/ML ~~LOC~~ SOLN
0.0000 [IU] | Freq: Every day | SUBCUTANEOUS | Status: DC
  Administered 2019-05-31: 22:00:00 3 [IU] via SUBCUTANEOUS
  Administered 2019-06-01: 5 [IU] via SUBCUTANEOUS
  Administered 2019-06-02: 3 [IU] via SUBCUTANEOUS
  Administered 2019-06-03: 5 [IU] via SUBCUTANEOUS

## 2019-05-31 MED ORDER — INSULIN ASPART 100 UNIT/ML ~~LOC~~ SOLN
0.0000 [IU] | Freq: Three times a day (TID) | SUBCUTANEOUS | Status: DC
  Administered 2019-05-31: 11:00:00 2 [IU] via SUBCUTANEOUS
  Administered 2019-05-31: 18:00:00 3 [IU] via SUBCUTANEOUS
  Administered 2019-06-01 – 2019-06-02 (×4): 5 [IU] via SUBCUTANEOUS
  Administered 2019-06-02 (×2): 3 [IU] via SUBCUTANEOUS
  Administered 2019-06-03: 2 [IU] via SUBCUTANEOUS
  Administered 2019-06-03: 13:00:00 1 [IU] via SUBCUTANEOUS
  Administered 2019-06-03: 3 [IU] via SUBCUTANEOUS
  Administered 2019-06-04: 2 [IU] via SUBCUTANEOUS

## 2019-05-31 MED ORDER — SODIUM CHLORIDE 0.9% FLUSH
3.0000 mL | Freq: Two times a day (BID) | INTRAVENOUS | Status: DC
  Administered 2019-05-31 – 2019-06-03 (×8): 3 mL via INTRAVENOUS

## 2019-05-31 MED ORDER — PREDNISONE 20 MG PO TABS
40.0000 mg | ORAL_TABLET | Freq: Every day | ORAL | Status: DC
  Administered 2019-05-31: 40 mg via ORAL
  Filled 2019-05-31: qty 2

## 2019-05-31 MED ORDER — SULFAMETHOXAZOLE-TRIMETHOPRIM 800-160 MG PO TABS
1.0000 | ORAL_TABLET | ORAL | Status: DC
  Administered 2019-05-31 – 2019-06-03 (×2): 1 via ORAL
  Filled 2019-05-31 (×2): qty 1

## 2019-05-31 MED ORDER — POTASSIUM CHLORIDE 10 MEQ/100ML IV SOLN
10.0000 meq | INTRAVENOUS | Status: DC
  Filled 2019-05-31 (×2): qty 100

## 2019-05-31 MED ORDER — HYDROCODONE-ACETAMINOPHEN 5-325 MG PO TABS: 1.0000 | ORAL_TABLET | ORAL | Status: DC | PRN

## 2019-05-31 MED ORDER — VERAPAMIL HCL ER 120 MG PO TBCR
120.0000 mg | EXTENDED_RELEASE_TABLET | Freq: Every day | ORAL | Status: DC
  Administered 2019-05-31 – 2019-06-03 (×4): 120 mg via ORAL
  Filled 2019-05-31 (×6): qty 1

## 2019-05-31 MED ORDER — INSULIN GLARGINE 100 UNIT/ML ~~LOC~~ SOLN
15.0000 [IU] | Freq: Every day | SUBCUTANEOUS | Status: DC
  Administered 2019-05-31 – 2019-06-03 (×4): 15 [IU] via SUBCUTANEOUS
  Filled 2019-05-31 (×5): qty 0.15

## 2019-05-31 MED ORDER — APIXABAN 5 MG PO TABS
5.0000 mg | ORAL_TABLET | Freq: Two times a day (BID) | ORAL | Status: DC
  Administered 2019-05-31 – 2019-06-04 (×9): 5 mg via ORAL
  Filled 2019-05-31 (×10): qty 1

## 2019-05-31 MED ORDER — IPRATROPIUM-ALBUTEROL 0.5-2.5 (3) MG/3ML IN SOLN: 3.0000 mL | Freq: Four times a day (QID) | RESPIRATORY_TRACT | Status: DC | PRN

## 2019-05-31 MED ORDER — ATENOLOL 25 MG PO TABS
75.0000 mg | ORAL_TABLET | Freq: Two times a day (BID) | ORAL | Status: DC
  Administered 2019-05-31 – 2019-06-04 (×9): 75 mg via ORAL
  Filled 2019-05-31 (×9): qty 3
  Filled 2019-05-31: qty 1

## 2019-05-31 MED ORDER — IPRATROPIUM-ALBUTEROL 0.5-2.5 (3) MG/3ML IN SOLN
3.0000 mL | Freq: Four times a day (QID) | RESPIRATORY_TRACT | Status: DC
  Administered 2019-05-31 – 2019-06-01 (×5): 3 mL via RESPIRATORY_TRACT
  Filled 2019-05-31 (×5): qty 3

## 2019-05-31 MED ORDER — ENSURE MAX PROTEIN PO LIQD
11.0000 [oz_av] | Freq: Every day | ORAL | Status: DC
  Administered 2019-05-31 – 2019-06-04 (×5): 11 [oz_av] via ORAL
  Filled 2019-05-31 (×7): qty 330

## 2019-05-31 MED ORDER — GUAIFENESIN ER 600 MG PO TB12
600.0000 mg | ORAL_TABLET | Freq: Every day | ORAL | Status: DC
  Administered 2019-05-31 – 2019-06-04 (×5): 600 mg via ORAL
  Filled 2019-05-31 (×5): qty 1

## 2019-05-31 MED ORDER — POTASSIUM CHLORIDE CRYS ER 20 MEQ PO TBCR
40.0000 meq | EXTENDED_RELEASE_TABLET | Freq: Every day | ORAL | Status: DC
  Administered 2019-05-31 – 2019-06-01 (×2): 40 meq via ORAL
  Filled 2019-05-31 (×2): qty 2

## 2019-05-31 MED ORDER — SODIUM CHLORIDE 0.9% FLUSH
3.0000 mL | Freq: Two times a day (BID) | INTRAVENOUS | Status: DC
  Administered 2019-05-31 – 2019-06-01 (×3): 3 mL via INTRAVENOUS

## 2019-05-31 MED ORDER — SENNOSIDES-DOCUSATE SODIUM 8.6-50 MG PO TABS: 1.0000 | ORAL_TABLET | Freq: Every evening | ORAL | Status: DC | PRN

## 2019-05-31 MED ORDER — FUROSEMIDE 10 MG/ML IJ SOLN
40.0000 mg | Freq: Two times a day (BID) | INTRAMUSCULAR | Status: DC
  Administered 2019-05-31 – 2019-06-01 (×3): 40 mg via INTRAVENOUS
  Filled 2019-05-31 (×3): qty 4

## 2019-05-31 MED ORDER — SODIUM CHLORIDE 0.9% FLUSH: 3.0000 mL | INTRAVENOUS | Status: DC | PRN

## 2019-05-31 MED ORDER — MAGNESIUM SULFATE 4 GM/100ML IV SOLN
4.0000 g | Freq: Once | INTRAVENOUS | Status: AC
  Administered 2019-05-31: 04:00:00 4 g via INTRAVENOUS
  Filled 2019-05-31: qty 100

## 2019-05-31 NOTE — Progress Notes (Signed)
PROGRESS NOTE    Felicia [name]  VXY:[id number] DOB: 01-20-1971 DOA: 05/30/2019 PCP: Aretta Nip, MD    Brief Narrative:  Patient was admitted to the hospital with a working diagnosis of acute on chronic hypoxic respiratory failure.   49 year old female who presented with dyspnea.  She does have significant past medical history for NSIP,(nonspecific interstitial pneumonitis), history of DVT, type 2 diabetes mellitus, pulmonary hypertension and diastolic heart failure.  She does have chronic hypoxic respiratory failure, on home oxygen 12 to 15 L/min per nasal cannula.  Over the last few days prior to hospitalization she has noticed worsening dyspnea and increased oxygen requirements, no fevers no chills.  On her initial physical examination blood pressure 109/92, pulse rate 106, respiratory rate 36, oxygen saturation 23%.  She had moist mucous membranes, her lungs are clear to auscultation bilaterally, poor air movement, heart is also present, rhythmic, abdomen soft, positive lower extremity edema.  Sodium 140, potassium 2.5, chloride 96, bicarb 31, glucose 190, BUN 7, creatinine 0.79, white count 11.4, hemoglobin 12.1, hematocrit 41.8, platelets 307.  SARS COVID-19 negative.  Chest radiograph with bilateral interstitial infiltrates.  EKG 106 bpm, right axis deviation, normal intervals, sinus rhythm with PVCs and PACs, no significant ST segment or T wave changes.  Assessment & Plan:   Principal Problem:   Acute on chronic respiratory failure with hypoxia (HCC) Active Problems:   Essential hypertension   Acute on chronic diastolic CHF (congestive heart failure) (HCC)   Pulmonary HTN (HCC)   Interstitial lung disease (HCC)   Hypokalemia   History of DVT (deep vein thrombosis)   Prolonged Q-T interval on ECG   Hypomagnesemia   1. Acute on chronic hypoxic respiratory failure, ILD, acute on chronic right heart failure/ core pulmonale/ pulmonary hypertension. Patient has received  furosemide on admission, urine output not documented yet but patient reports improvement on her symptoms but not yet back to baseline. She has been tapering down steroids when her symptoms exacerbated.   Old records personally reviewed, last hospitalization on 04/23/19 to 04/28/19, patient at home on cellcept, oral steroids and bactrim prophylaxis. Her DLCO is 19% pred per last PFT.   Will continue diuresis and will add steroids with methylprednisolone 60 mg IV q12. Continue oxymetry monitoring and supplemental 02 will order high flow nasal cannula. Monitor strict in and out.   Continue mycophenolate, and sildenafil. Continue bronchodilators and airway clearing techniques with flutter valve and incentive spirometer.   Patient continue to be at high risk for worsening respiratory failure  2. HTN. Continue blood pressure control with atenolol and verapamil.    3. Hx DVT. Continue anticoagulation with apixaban,   4. Hypokalemia. K this am up to 3,8 with serum cr at 0,88, will continue to follow up on renal function and electrolytes, continue K correction with Kc, check Mg in am.   3. T2dm. Continue glucose cover and monitoring with insulin sliding scale. Patient is tolerating po well. Basal insulin with 15 units glargine.       DVT prophylaxis: apixaban  Code Status:   full  Family Communication:  No family at the bedside   Disposition Plan:   Patient is from:  Home   Anticipated DC to:  Home   Anticipated DC date:  To be determined   Anticipated DC barriers: Patient on high oxygen requirements and needing close hemodynamic and oxymetry monitoring. ON IV diuresis.       Subjective: Patient with mild improvement in her symptoms but not yet back  to baseline, continue to have significant dyspnea at rest. No nausea or vomiting.   Objective: Vitals:   05/31/19 1245 05/31/19 1345 05/31/19 1415 05/31/19 1451  BP: 98/75 106/84 125/66 (!) 113/57  Pulse: (!) 105 96 96 91  Resp: (!) 31  (!) 29  20  Temp:    97.9 F (36.6 C)  TempSrc:    Axillary  SpO2: 97% 100% 100% 100%  Weight:      Height:       No intake or output data in the 24 hours ending 05/31/19 1505 Filed Weights   05/30/19 1845  Weight: 95.7 kg    Examination:   General: Not in pain deconditioned, positive dyspnea at rest.  Neurology: Awake and alert, non focal  E ENT: mild pallor, no icterus, oral mucosa moist Cardiovascular: No JVD. S1-S2 present, rhythmic, no gallops, rubs, or murmurs. No lower extremity edema. Pulmonary: positive breath sounds bilaterally,decreased air movement, no wheezing, no rhonchi , bilateral rales. Gastrointestinal. Abdomen with no organomegaly, non tender, no rebound or guarding Skin. No rashes Musculoskeletal: no joint deformities     Data Reviewed: I have personally reviewed following labs and imaging studies  CBC: Recent Labs  Lab 05/30/19 1937 05/31/19 0400  WBC 11.4* 9.0  NEUTROABS 10.6* 7.6  HGB 12.1 12.1  HCT 41.8 41.3  MCV 91.1 89.8  PLT 307 294   Basic Metabolic Panel: Recent Labs  Lab 05/30/19 1937 05/31/19 0025 05/31/19 0400  NA 140  --  144  K 2.5*  --  3.8  CL 96*  --  100  CO2 31  --  29  GLUCOSE 190*  --  166*  BUN 7  --  10  CREATININE 0.79  --  0.88  CALCIUM 7.9*  --  8.3*  MG  --  1.3* 1.5*   GFR: Estimated Creatinine Clearance: 88.5 mL/min (by C-G formula based on SCr of 0.88 mg/dL). Liver Function Tests: Recent Labs  Lab 05/30/19 1937  AST 23  ALT 28  ALKPHOS 42  BILITOT 0.7  PROT 5.6*  ALBUMIN 3.2*   No results for input(s): LIPASE, AMYLASE in the last 168 hours. No results for input(s): AMMONIA in the last 168 hours. Coagulation Profile: No results for input(s): INR, PROTIME in the last 168 hours. Cardiac Enzymes: No results for input(s): CKTOTAL, CKMB, CKMBINDEX, TROPONINI in the last 168 hours. BNP (last 3 results) No results for input(s): PROBNP in the last 8760 hours. HbA1C: Recent Labs    05/31/19 0400    HGBA1C 8.8*   CBG: Recent Labs  Lab 05/31/19 0743 05/31/19 1059  GLUCAP 132* 176*   Lipid Profile: No results for input(s): CHOL, HDL, LDLCALC, TRIG, CHOLHDL, LDLDIRECT in the last 72 hours. Thyroid Function Tests: No results for input(s): TSH, T4TOTAL, FREET4, T3FREE, THYROIDAB in the last 72 hours. Anemia Panel: No results for input(s): VITAMINB12, FOLATE, FERRITIN, TIBC, IRON, RETICCTPCT in the last 72 hours.    Radiology Studies: I have reviewed all of the imaging during this hospital visit personally     Scheduled Meds: . apixaban  5 mg Oral BID  . atenolol  75 mg Oral BID  . furosemide  40 mg Intravenous Q12H  . gabapentin  300 mg Oral TID  . guaiFENesin  600 mg Oral Daily  . insulin aspart  0-5 Units Subcutaneous QHS  . insulin aspart  0-9 Units Subcutaneous TID WC  . insulin glargine  15 Units Subcutaneous QHS  . mycophenolate  1,500 mg Oral  BID  . potassium chloride SA  40 mEq Oral Daily  . predniSONE  40 mg Oral Q breakfast  . sildenafil  20 mg Oral BID  . sodium chloride flush  3 mL Intravenous Q12H  . sodium chloride flush  3 mL Intravenous Q12H  . sulfamethoxazole-trimethoprim  1 tablet Oral Once per day on Mon Wed Fri  . verapamil  120 mg Oral QHS   Continuous Infusions: . sodium chloride       LOS: 1 day        Mead Slane Gerome Apley, MD

## 2019-05-31 NOTE — ED Notes (Signed)
Second run of K paused. IV team difficulty obtaining IV access

## 2019-05-31 NOTE — ED Notes (Signed)
IV team at bedside 

## 2019-05-31 NOTE — H&P (Signed)
History and Physical    Felicia [name] JYN:[id number] DOB: 04/16/1970 DOA: 05/30/2019  PCP: Felicia Nip, MD   Patient coming from: Home   Chief Complaint: SOB   HPI: Felicia [name] is [name] 49 y.o. female with medical history significant for nonspecific interstitial pneumonitis, history of DVT on Eliquis, insulin-dependent diabetes mellitus, pulmonary arterial hypertension, and chronic diastolic CHF, now presenting to the emergency department with increased shortness of breath.  Patient requires 12 to 15 L/min of supplemental oxygen at her baseline, but has had increasing dyspnea over the past few days and has been unable to maintain an adequate oxygen saturation at home.  She has had [name] cough, occasionally productive of nonbloody sputum, has had some leg swelling that has not changed much, and denies any fevers, chills, or chest pain.  She reports recently increasing her daily prednisone, continuing her diuretics, but has continued to worsen.  ED Course: Upon arrival to the ED, patient is found to be afebrile, saturating upper 80s on 15 L/min of supplemental oxygen, tachypneic in the 30s, slightly tachycardic, and with stable blood pressure.  Chest x-ray demonstrates interstitial lung disease without acute change.  EKG with sinus tachycardia rate 106 and QTc interval 523 ms.  Chemistry panel notable for potassium of 2.5.  CBC with leukocytosis to 11,400.  Covid and influenza PCR are negative.  BNP is elevated to 1085.  ED physician discussed the case with pulmonology who recommended medical admission and Lasix.  Patient was treated with oral and IV potassium, 20 mg IV Lasix, and supplemental oxygen via nonrebreather in ED.  Review of Systems:  All other systems reviewed and apart from HPI, are negative.  Past Medical History:  Diagnosis Date  . Diabetes mellitus without complication ([place])    type 2   . Hypertension   . NSIP (nonspecific interstitial pneumonitis) (Felicia [name])   . SVT  (supraventricular tachycardia) (HCC)     Past Surgical History:  Procedure Laterality Date  . CESAREAN SECTION    . LUNG BIOPSY    . RIGHT HEART CATH N/[name] 04/26/2019   Procedure: RIGHT HEART CATH;  Surgeon: Felicia Dresser, MD;  Location: [facility];  Service: Cardiovascular;  Laterality: N/[name];     reports that she has never smoked. She has never used smokeless tobacco. She reports that she does not drink alcohol or use drugs.  No Known Allergies  Family History  Problem Relation Age of Onset  . Hypertension Mother   . Cancer Father   . Diabetes Father   . Hypertension Father   . Diabetes Sister   . Mental retardation Sister   . Cancer Maternal Grandmother   . Cancer Maternal Grandfather   . Cancer Paternal Grandmother   . Cancer Paternal Grandfather      Prior to Admission medications   Medication Sig Start Date End Date Taking? Authorizing Provider  acetaminophen (TYLENOL) 500 MG tablet Take 2,000 mg by mouth as needed for moderate pain.    Yes [provider]  albuterol (PROVENTIL HFA;VENTOLIN HFA) 108 (90 Base) MCG/ACT inhaler Inhale 1 puff into the lungs every 6 (six) hours as needed for wheezing or shortness of breath.   Yes [provider]  apixaban (ELIQUIS) 5 MG TABS tablet Take 1 tablet (5 mg total) by mouth 2 (two) times daily. 03/12/19 05/30/19 Yes Olalere, Adewale A, MD  atenolol (TENORMIN) 50 MG tablet Take 75 mg by mouth 2 (two) times daily.  01/08/17  Yes [provider]  cholecalciferol (  VITAMIN D3) 25 MCG (1000 UT) tablet Take 1,000 Units by mouth daily.   Yes [provider]  furosemide (LASIX) 20 MG tablet Take 3 tablets (60 mg total) by mouth 2 (two) times daily. 04/28/19 06/27/19 Yes Kyle, Tyrone A, DO  gabapentin (NEURONTIN) 300 MG capsule Take 1 capsule (300 mg total) by mouth 3 (three) times daily for 7 days. 04/28/19 05/30/19 Yes Kyle, Tyrone A, DO  guaiFENesin (MUCINEX) 600 MG 12 hr tablet Take 600 mg by mouth daily.     Yes [provider]  insulin degludec (TRESIBA FLEXTOUCH) 100 UNIT/ML FlexTouch Pen Inject 24 Units into the skin daily.   Yes [provider]  insulin lispro (HUMALOG KWIKPEN) 100 UNIT/ML KwikPen Inject 0-20 Units into the skin 2 (two) times daily. Sliding scale based on sugar level   Yes [provider]  ipratropium-albuterol (DUONEB) 0.5-2.5 (3) MG/3ML SOLN Inhale 3 mLs into the lungs every 6 (six) hours as needed (sob and wheezing).  01/08/17  Yes [provider]  metFORMIN (GLUCOPHAGE-XR) 500 MG 24 hr tablet Take 1,000 mg by mouth at bedtime.  04/10/19  Yes [provider]  mycophenolate (CELLCEPT) 500 MG tablet Take 1,500 mg by mouth 2 (two) times daily.    Yes [provider]  naproxen sodium (ALEVE) 220 MG tablet Take 440 mg by mouth as needed (pain).   Yes [provider]  potassium chloride SA (KLOR-CON) 20 MEQ tablet Take 2 tablets (40 mEq total) by mouth daily. 04/29/19 05/30/19 Yes Kyle, Tyrone A, DO  predniSONE (DELTASONE) 10 MG tablet Take 40 mg by mouth daily with breakfast.   Yes [provider]  sildenafil (REVATIO) 20 MG tablet Take 20 mg by mouth 2 (two) times daily. 04/15/19  Yes [provider]  sulfamethoxazole-trimethoprim (BACTRIM DS) 800-160 MG tablet Take 1 tablet by mouth 3 (three) times [name] week. 12/24/18  Yes [provider]  verapamil (CALAN-SR) 120 MG CR tablet Take 1 tablet (120 mg total) by mouth at bedtime. 09/23/14  Yes Felicia Quill, NP    Physical Exam: Vitals:   05/31/19 0000 05/31/19 0115 05/31/19 0246 05/31/19 0247  BP: (!) 109/92 102/78 120/79   Pulse: (!) 106 (!) 31 98 (!) 102  Resp: (!) 36 (!) 27 (!) 33 (!) 31  Temp:      TempSrc:      SpO2: 93% 98% 100% 99%  Weight:      Height:        Constitutional: tachypneic, calm  Eyes: PERTLA, lids and conjunctivae normal ENMT: Mucous membranes are moist. Posterior pharynx clear of any exudate or lesions.   Neck: normal,  supple, no masses, no thyromegaly Respiratory: Tachypneic, speaking full sentences, no wheezing. No pallor or cyanosis.    Cardiovascular: S1 & S2 heard, regular rate and rhythm. Mild pitting edema bilateral lower legs.   Abdomen: No distension, no tenderness, soft. Bowel sounds active.  Musculoskeletal: no clubbing / cyanosis. No joint deformity upper and lower extremities.   Skin: no significant rashes, lesions, ulcers. Warm, dry, well-perfused. Neurologic: CN 2-12 grossly intact. Sensation intact. Moving all extremities.    Psychiatric: Alert and oriented to person, place, and situation. Pleasant and cooperative.    Labs and Imaging on Admission: I have personally reviewed following labs and imaging studies  CBC: Recent Labs  Lab 05/30/19 1937  WBC 11.4*  NEUTROABS 10.6*  HGB 12.1  HCT 41.8  MCV 91.1  PLT 847   Basic Metabolic Panel: Recent Labs  Lab  05/30/19 1937 05/31/19 0025  NA 140  --   K 2.5*  --   CL 96*  --   CO2 31  --   GLUCOSE 190*  --   BUN 7  --   CREATININE 0.79  --   CALCIUM 7.9*  --   MG  --  1.3*   GFR: Estimated Creatinine Clearance: 97.4 mL/min (by C-G formula based on SCr of 0.79 mg/dL). Liver Function Tests: Recent Labs  Lab 05/30/19 1937  AST 23  ALT 28  ALKPHOS 42  BILITOT 0.7  PROT 5.6*  ALBUMIN 3.2*   No results for input(s): LIPASE, AMYLASE in the last 168 hours. No results for input(s): AMMONIA in the last 168 hours. Coagulation Profile: No results for input(s): INR, PROTIME in the last 168 hours. Cardiac Enzymes: No results for input(s): CKTOTAL, CKMB, CKMBINDEX, TROPONINI in the last 168 hours. BNP (last 3 results) No results for input(s): PROBNP in the last 8760 hours. HbA1C: No results for input(s): HGBA1C in the last 72 hours. CBG: No results for input(s): GLUCAP in the last 168 hours. Lipid Profile: No results for input(s): CHOL, HDL, LDLCALC, TRIG, CHOLHDL, LDLDIRECT in the last 72 hours. Thyroid Function Tests: No  results for input(s): TSH, T4TOTAL, FREET4, T3FREE, THYROIDAB in the last 72 hours. Anemia Panel: No results for input(s): VITAMINB12, FOLATE, FERRITIN, TIBC, IRON, RETICCTPCT in the last 72 hours. Urine analysis:    Component Value Date/Time   COLORURINE YELLOW 12/24/2008 1604   APPEARANCEUR CLOUDY ([name]) 12/24/2008 1604   LABSPEC 1.019 12/24/2008 1604   PHURINE 7.5 12/24/2008 1604   GLUCOSEU NEGATIVE 12/24/2008 1604   HGBUR NEGATIVE 12/24/2008 1604   BILIRUBINUR NEGATIVE 12/24/2008 1604   KETONESUR NEGATIVE 12/24/2008 1604   PROTEINUR NEGATIVE 12/24/2008 1604   UROBILINOGEN 0.2 12/24/2008 1604   NITRITE NEGATIVE 12/24/2008 1604   LEUKOCYTESUR MODERATE ([name]) 12/24/2008 1604   Sepsis Labs: _0 (procalcitonin:4,lacticidven:4) ) Recent Results (from the past 240 hour(s))  Respiratory Panel by RT PCR (Flu [name]&B, Covid) - Nasopharyngeal Swab     Status: None   Collection Time: 05/30/19 10:10 PM   Specimen: Nasopharyngeal Swab  Result Value Ref Range Status   SARS Coronavirus 2 by RT PCR NEGATIVE NEGATIVE Final    Comment: (NOTE) SARS-CoV-2 target nucleic acids are NOT DETECTED. The SARS-CoV-2 RNA is generally detectable in upper respiratoy specimens during the acute phase of infection. The lowest concentration of SARS-CoV-2 viral copies this assay can detect is 131 copies/mL. [name] negative result does not preclude SARS-Cov-2 infection and should not be used as the sole basis for treatment or other patient management decisions. [name] negative result may occur with  improper specimen collection/handling, submission of specimen other than nasopharyngeal swab, presence of viral mutation(s) within the areas targeted by this assay, and inadequate number of viral copies (<131 copies/mL). [name] negative result must be combined with clinical observations, patient history, and epidemiological information. The expected result is Negative. Fact Sheet for Patients:    PinkCheek.be Fact Sheet for Healthcare Providers:  GravelBags.it This test is not yet ap proved or cleared by the [place] FDA and  has been authorized for detection and/or diagnosis of SARS-CoV-2 by FDA under an Emergency Use Authorization (EUA). This EUA will remain  in effect (meaning this test can be used) for the duration of the COVID-19 declaration under Section 564(b)(1) of the Act, 21 U.S.C. section 360bbb-3(b)(1), unless the authorization is terminated or revoked sooner.    Influenza [name] by PCR NEGATIVE NEGATIVE Final  Influenza B by PCR NEGATIVE NEGATIVE Final    Comment: (NOTE) The Xpert Xpress SARS-CoV-2/FLU/RSV assay is intended as an aid in  the diagnosis of influenza from Nasopharyngeal swab specimens and  should not be used as [name] sole basis for treatment. Nasal washings and  aspirates are unacceptable for Xpert Xpress SARS-CoV-2/FLU/RSV  testing. Fact Sheet for Patients: PinkCheek.be Fact Sheet for Healthcare Providers: GravelBags.it This test is not yet approved or cleared by the [place] FDA and  has been authorized for detection and/or diagnosis of SARS-CoV-2 by  FDA under an Emergency Use Authorization (EUA). This EUA will remain  in effect (meaning this test can be used) for the duration of the  Covid-19 declaration under Section 564(b)(1) of the Act, 21  U.S.C. section 360bbb-3(b)(1), unless the authorization is  terminated or revoked. Performed at [facility], [place] [address], [place], [place] [address]      Radiological Exams on Admission: DG Chest Port 1 View  Result Date: 05/30/2019 CLINICAL DATA:  Shortness of breath EXAM: PORTABLE CHEST 1 VIEW COMPARISON:  04/23/2019 FINDINGS: Moderate diffuse interstitial opacity, unchanged. This is consistent with known interstitial lung disease. No pleural effusion or pneumothorax. No  focal airspace consolidation. IMPRESSION: Unchanged appearance of interstitial lung disease. Electronically Signed   By: Ulyses Jarred M.D.   On: 05/30/2019 19:40    EKG: Independently reviewed. Sinus tachycardia (rate 106), QTc 523 ms.   Assessment/Plan   1. Acute on chronic hypoxic respiratory failure; ILD; acute on chronic diastolic CHF; PAH  - Patient with ILD/NSIP, PAH, and chronic diastolic CHF with chronic 12-15 Lpm supplemental O2 requirement, now p/w increased SOB and hypoxia on 15 Lpm  - No acute findings on CXR, no fevers, COVID and influenza pcr negative  - ED physician discussed with pulmonology, she was placed on NRB in ED and treated with Lasix  - Continue diuresis with IV Lasix while replacing electrolytes and following daily chem panels, continue prednisone, Cellcept with Bactrim ppx, continue Revatio    2. Hypokalemia; hypomagnesemia  - Likely secondary to diuretics  - Potassium replaced IV and PO in ED  - Replace magnesium, repeat chemistries in am    3. Prolonged QT  - QTc is 523 ms in ED  - Correct potassium and magnesium levels, continue cardiac monitoring, minimize QT-prolonging medications, and repeat EKG in am    4. Hypertension  - BP at goal, continue atenolol   5. History of DVT  - No evidence for acute VTE, continue Eliquis    6. Insulin-dependent DM  - A1c was 8.6% in January 2021  - Check CBGs, continue long- and short-acting insulin    DVT prophylaxis: Eliquis  Code Status: Full, confirmed with patient on admission  Family Communication: Discussed with patient  Disposition Plan:  Patient is from: home  Anticipated d/c is to: TBD Anticipated d/c date is: 06/03/19 Patient currently: Requiring ongoing evaluation and mgmt for acute on chronic respiratory failure  Consults called: None  Admission status: Inpatient     Vianne Bulls, MD [facility] Pager: See [email or web address]  If 7AM-7PM, please contact the daytime  attending [email or web address]  05/31/2019, 3:25 AM

## 2019-05-31 NOTE — ED Notes (Signed)
4th run of Potassium started

## 2019-05-31 NOTE — Progress Notes (Signed)
Initial Nutrition Assessment  DOCUMENTATION CODES:   Obesity unspecified  INTERVENTION:  Ensure Max daily, each supplement provides 150 kcal, 30 grams protein  NUTRITION DIAGNOSIS:   Inadequate oral intake related to decreased appetite as evidenced by per patient/family report.    GOAL:   Patient will meet greater than or equal to 90% of their needs    MONITOR:   PO intake, Supplement acceptance, Weight trends, Labs, I & O's  REASON FOR ASSESSMENT:   Malnutrition Screening Tool    ASSESSMENT:   Pt with a PMH significant for interstitial pneumonitis, hx of DVT, DM, HTN, and CHF admitted with acute on chronic respiratory failure with hypoxia.  RN and MD in room at time of visit.   Pt reports appetite was poor for a couple days PTA, but states her appetite is coming back. Prior to her appetite becoming poor, she was adhering to a carb modified diet and a fluid restriction. Pt states she was eating regularly and consuming balanced meals. Pt reports an intentional 19 lb weight loss over the last 3 months. This 9% wt loss is reflected in her wt readings and is significant for time frame.   No PO intake recorded.   Labs: Mg 1.5 (L), CBGs 132-176 Medications reviewed and include: Lasix, Novolog, Lantus, Klor-con, Deltasone   NUTRITION - FOCUSED PHYSICAL EXAM:    Most Recent Value  Orbital Region  No depletion  Upper Arm Region  No depletion  Thoracic and Lumbar Region  No depletion  Buccal Region  No depletion  Temple Region  No depletion  Clavicle Bone Region  No depletion  Clavicle and Acromion Bone Region  No depletion  Scapular Bone Region  No depletion  Dorsal Hand  No depletion  Patellar Region  No depletion  Anterior Thigh Region  No depletion  Posterior Calf Region  No depletion  Edema (RD Assessment)  None  Hair  Reviewed  Eyes  Reviewed  Mouth  Reviewed  Skin  Reviewed  Nails  Reviewed       Diet Order:   Diet Order            Diet heart  healthy/carb modified Room service appropriate? Yes; Fluid consistency: Thin  Diet effective now              EDUCATION NEEDS:   No education needs have been identified at this time  Skin:  Skin Assessment: Reviewed RN Assessment  Last BM:  4/22  Height:   Ht Readings from Last 1 Encounters:  05/30/19 _0  (1.651 m)    Weight:   Wt Readings from Last 6 Encounters:  05/30/19 95.7 kg  04/26/19 99.5 kg  03/03/19 105.9 kg  02/03/19 107.5 kg  04/05/18 101.6 kg  03/30/18 101.6 kg    BMI:  Body mass index is 35.11 kg/m.  Estimated Nutritional Needs:   Kcal:  5631-4970  Protein:  110-120 grams  Fluid:  >1.75L/d    Larkin Ina, MS, RD, LDN RD pager number and weekend/on-call pager number located in [place].

## 2019-05-31 NOTE — ED Notes (Signed)
SDU  Bfast ordered

## 2019-06-01 LAB — BASIC METABOLIC PANEL
Anion gap: 12 (ref 5–15)
BUN: 16 mg/dL (ref 6–20)
CO2: 32 mmol/L (ref 22–32)
Calcium: 8.3 mg/dL — ABNORMAL LOW (ref 8.9–10.3)
Chloride: 96 mmol/L — ABNORMAL LOW (ref 98–111)
Creatinine, Ser: 0.82 mg/dL (ref 0.44–1.00)
GFR calc Af Amer: >60 mL/min (ref >60)
GFR calc non Af Amer: >60 mL/min (ref >60)
Glucose, Bld: 263 mg/dL — ABNORMAL HIGH (ref 70–99)
Potassium: 4.4 mmol/L (ref 3.5–5.1)
Sodium: 140 mmol/L (ref 135–145)

## 2019-06-01 LAB — GLUCOSE, CAPILLARY
Glucose-Capillary: 251 mg/dL — ABNORMAL HIGH (ref 70–99)
Glucose-Capillary: 289 mg/dL — ABNORMAL HIGH (ref 70–99)
Glucose-Capillary: 254 mg/dL — ABNORMAL HIGH (ref 70–99)
Glucose-Capillary: 389 mg/dL — ABNORMAL HIGH (ref 70–99)

## 2019-06-01 LAB — MAGNESIUM: Magnesium: 2 mg/dL (ref 1.7–2.4)

## 2019-06-01 MED ORDER — DIPHENHYDRAMINE-ZINC ACETATE 2-0.1 % EX CREA
TOPICAL_CREAM | Freq: Three times a day (TID) | CUTANEOUS | Status: DC | PRN
  Administered 2019-06-01 – 2019-06-03 (×3): 1 via TOPICAL
  Filled 2019-06-01: qty 28

## 2019-06-01 MED ORDER — IPRATROPIUM-ALBUTEROL 0.5-2.5 (3) MG/3ML IN SOLN
3.0000 mL | Freq: Four times a day (QID) | RESPIRATORY_TRACT | Status: DC
  Administered 2019-06-02 – 2019-06-03 (×6): 3 mL via RESPIRATORY_TRACT
  Filled 2019-06-01 (×6): qty 3

## 2019-06-01 MED ORDER — FUROSEMIDE 40 MG PO TABS
40.0000 mg | ORAL_TABLET | Freq: Every day | ORAL | Status: DC
  Administered 2019-06-02 – 2019-06-03 (×2): 40 mg via ORAL
  Filled 2019-06-01 (×2): qty 1

## 2019-06-01 MED ORDER — LIDOCAINE 5 % EX PTCH
1.0000 | MEDICATED_PATCH | CUTANEOUS | Status: DC
  Administered 2019-06-01 – 2019-06-02 (×2): 1 via TRANSDERMAL
  Filled 2019-06-01 (×4): qty 1

## 2019-06-01 NOTE — Progress Notes (Signed)
PROGRESS NOTE    Felicia [name]  ZMC:[id number] DOB: 05/20/1970 DOA: 05/30/2019 PCP: Aretta Nip, MD    Brief Narrative:  Patient was admitted to the hospital with a working diagnosis of acute on chronic hypoxic respiratory failure.   49 year old female who presented with dyspnea.  She does have significant past medical history for NSIP,(nonspecific interstitial pneumonitis), history of DVT, type 2 diabetes mellitus, pulmonary hypertension and diastolic heart failure.  She does have chronic hypoxic respiratory failure, on home oxygen 12 to 15 L/min per nasal cannula.  Over the last few days prior to hospitalization she has noticed worsening dyspnea and increased oxygen requirements, no fevers no chills.  On her initial physical examination blood pressure 109/92, pulse rate 106, respiratory rate 36, oxygen saturation 23%.  She had moist mucous membranes, her lungs are clear to auscultation bilaterally, poor air movement, heart is also present, rhythmic, abdomen soft, positive lower extremity edema.  Sodium 140, potassium 2.5, chloride 96, bicarb 31, glucose 190, BUN 7, creatinine 0.79, white count 11.4, hemoglobin 12.1, hematocrit 41.8, platelets 307.  SARS COVID-19 negative.  Chest radiograph with bilateral interstitial infiltrates.  EKG 106 bpm, right axis deviation, normal intervals, sinus rhythm with PVCs and PACs, no significant ST segment or T wave changes.  Old records personally reviewed, last hospitalization on 04/23/19 to 04/28/19, patient at home on cellcept, oral steroids and bactrim prophylaxis. Her DLCO is 19% pred per last PFT.   Patient has been placed on diuresis, systemic steroids and high flow nasal cannula with slow improvement in her symptoms.   Assessment & Plan:   Principal Problem:   Acute on chronic respiratory failure with hypoxia (HCC) Active Problems:   Essential hypertension   Acute on chronic diastolic CHF (congestive heart failure) (HCC)   Pulmonary  HTN (HCC)   Interstitial lung disease (HCC)   Hypokalemia   History of DVT (deep vein thrombosis)   Prolonged Q-T interval on ECG   Hypomagnesemia   1. Acute on chronic hypoxic respiratory failure, ILD, acute on chronic right heart failure/ core pulmonale/ pulmonary hypertension. Urine output not documented only 200 cc,  but patient reports improvement on her symptoms, still not yet back to baseline. On 15 l/ min per HFNC with oxygen saturation 100%  Will decrease furosemide to oral 40 mg and will continue with systemic steroids, methylprednisolone 60 mg IV q12.   On mycophenolate, sildenafil and prophylactic bactrim. On bronchodilators and airway clearing techniques with flutter valve and incentive spirometer. Will start patient physical therapy and occupational therapy in am, patient at home on rolling walker.   Patient continue to be at high risk for worsening respiratory failure  2. HTN. Blood pressure 102/60, will continue with atenolol and verapamil.    3. Hx DVT. On apixaban for anticoagulation.    4. Hypokalemia/ chronic metabolic alkalosis. K corrected to 4,0 with Mg at 2,0 and serum cr at 0,82 with bicarbonate at 32. Will reduce dose of furosemide and will continue to follow on renal function and electrolytes.   3. T2dm. Fasting glucose is 263, will continue basal insulin 15 units and insulin sliding scale. Patient is tolerating po well.       DVT prophylaxis:      apixaban  Code Status:              full  Family Communication:       No family at the bedside   Disposition Plan:  Patient is from:                        Home              Anticipated DC to:                   Home              Anticipated DC date:               To be determined              Anticipated DC barriers:         Patient on high oxygen requirements and needing close hemodynamic and oxymetry monitoring. ON IV diuresis.            Subjective: Patient with mild  improvement of dyspnea but still not yet back to baseline, no nausea or vomiting, limited mobility at home. Positive cough  Objective: Vitals:   06/01/19 0453 06/01/19 0720 06/01/19 0819 06/01/19 0935  BP: 107/68 101/79 102/60   Pulse: 91 66 80   Resp: 20 (!) 24    Temp: 98.5 F (36.9 C)  98 F (36.7 C)   TempSrc: Oral  Oral   SpO2: 100% 97% 100% 98%  Weight: 96 kg     Height:        Intake/Output Summary (Last 24 hours) at 06/01/2019 0951 Last data filed at 05/31/2019 1500 Gross per 24 hour  Intake --  Output 200 ml  Net -200 ml   Filed Weights   05/30/19 1845 06/01/19 0453  Weight: 95.7 kg 96 kg    Examination:   General: Not in pain, deconditioned and positive dyspnea at rest.  Neurology: Awake and alert, non focal  E ENT: mild pallor, no icterus, oral mucosa moist Cardiovascular: No JVD. S1-S2 present, rhythmic, no gallops, rubs, or murmurs. No lower extremity edema. Pulmonary: positive breath sounds bilaterally, decreased air movement, no wheezing or rhonchi, bilateral rales. Gastrointestinal. Abdomen with no organomegaly, non tender, no rebound or guarding Skin. No rashes Musculoskeletal: no joint deformities     Data Reviewed: I have personally reviewed following labs and imaging studies  CBC: Recent Labs  Lab 05/30/19 1937 05/31/19 0400  WBC 11.4* 9.0  NEUTROABS 10.6* 7.6  HGB 12.1 12.1  HCT 41.8 41.3  MCV 91.1 89.8  PLT 307 440   Basic Metabolic Panel: Recent Labs  Lab 05/30/19 1937 05/31/19 0025 05/31/19 0400 06/01/19 0349  NA 140  --  144 140  K 2.5*  --  3.8 4.4  CL 96*  --  100 96*  CO2 31  --  29 32  GLUCOSE 190*  --  166* 263*  BUN 7  --  10 16  CREATININE 0.79  --  0.88 0.82  CALCIUM 7.9*  --  8.3* 8.3*  MG  --  1.3* 1.5* 2.0   GFR: Estimated Creatinine Clearance: 95.1 mL/min (by C-G formula based on SCr of 0.82 mg/dL). Liver Function Tests: Recent Labs  Lab 05/30/19 1937  AST 23  ALT 28  ALKPHOS 42  BILITOT 0.7  PROT  5.6*  ALBUMIN 3.2*   No results for input(s): LIPASE, AMYLASE in the last 168 hours. No results for input(s): AMMONIA in the last 168 hours. Coagulation Profile: No results for input(s): INR, PROTIME in the last 168 hours. Cardiac Enzymes: No results for input(s): CKTOTAL, CKMB, CKMBINDEX, TROPONINI in the last 168  hours. BNP (last 3 results) No results for input(s): PROBNP in the last 8760 hours. HbA1C: Recent Labs    05/31/19 0400  HGBA1C 8.8*   CBG: Recent Labs  Lab 05/31/19 0743 05/31/19 1059 05/31/19 1740 05/31/19 2055 06/01/19 0817  GLUCAP 132* 176* 223* 222* 251*   Lipid Profile: No results for input(s): CHOL, HDL, LDLCALC, TRIG, CHOLHDL, LDLDIRECT in the last 72 hours. Thyroid Function Tests: No results for input(s): TSH, T4TOTAL, FREET4, T3FREE, THYROIDAB in the last 72 hours. Anemia Panel: No results for input(s): VITAMINB12, FOLATE, FERRITIN, TIBC, IRON, RETICCTPCT in the last 72 hours.    Radiology Studies: I have reviewed all of the imaging during this hospital visit personally     Scheduled Meds: . apixaban  5 mg Oral BID  . atenolol  75 mg Oral BID  . furosemide  40 mg Intravenous Q12H  . gabapentin  300 mg Oral TID  . guaiFENesin  600 mg Oral Daily  . insulin aspart  0-5 Units Subcutaneous QHS  . insulin aspart  0-9 Units Subcutaneous TID WC  . insulin glargine  15 Units Subcutaneous QHS  . ipratropium-albuterol  3 mL Inhalation Q6H  . lidocaine  1 patch Transdermal Q24H  . methylPREDNISolone (SOLU-MEDROL) injection  60 mg Intravenous Q12H  . mycophenolate  1,500 mg Oral BID  . potassium chloride SA  40 mEq Oral Daily  . Ensure Max Protein  11 oz Oral Daily  . sildenafil  20 mg Oral BID  . sodium chloride flush  3 mL Intravenous Q12H  . sodium chloride flush  3 mL Intravenous Q12H  . sulfamethoxazole-trimethoprim  1 tablet Oral Once per day on Mon Wed Fri  . verapamil  120 mg Oral QHS   Continuous Infusions: . sodium chloride        LOS: 2 days        Sydney Hasten Gerome Apley, MD

## 2019-06-01 NOTE — Discharge Instructions (Signed)
Information on my medicine - ELIQUIS (apixaban)  This medication education was reviewed with me or my healthcare representative as part of my discharge preparation.  The pharmacist that spoke with me during my hospital stay was:  Onnie Boer, RPH-CPP  Why was Eliquis prescribed for you? Eliquis was prescribed to treat blood clots that may have been found in the veins of your legs (deep vein thrombosis) or in your lungs (pulmonary embolism) and to reduce the risk of them occurring again.  What do You need to know about Eliquis ? Continue Eliquis 5 mg tablet taken TWICE daily.  Eliquis may be taken with or without food.   Try to take the dose about the same time in the morning and in the evening. If you have difficulty swallowing the tablet whole please discuss with your pharmacist how to take the medication safely.  Take Eliquis exactly as prescribed and DO NOT stop taking Eliquis without talking to the doctor who prescribed the medication.  Stopping may increase your risk of developing a new blood clot.  Refill your prescription before you run out.  After discharge, you should have regular check-up appointments with your healthcare provider that is prescribing your Eliquis.    What do you do if you miss a dose? If a dose of ELIQUIS is not taken at the scheduled time, take it as soon as possible on the same day and twice-daily administration should be resumed. The dose should not be doubled to make up for a missed dose.  Important Safety Information A possible side effect of Eliquis is bleeding. You should call your healthcare provider right away if you experience any of the following: ? Bleeding from an injury or your nose that does not stop. ? Unusual colored urine (red or dark brown) or unusual colored stools (red or black). ? Unusual bruising for unknown reasons. ? A serious fall or if you hit your head (even if there is no bleeding).  Some medicines may interact with Eliquis  and might increase your risk of bleeding or clotting while on Eliquis. To help avoid this, consult your healthcare provider or pharmacist prior to using any new prescription or non-prescription medications, including herbals, vitamins, non-steroidal anti-inflammatory drugs (NSAIDs) and supplements.  This website has more information on Eliquis (apixaban): http://[email or web address].com/[email or web address]

## 2019-06-02 LAB — BASIC METABOLIC PANEL
Anion gap: 9 (ref 5–15)
BUN: 21 mg/dL — ABNORMAL HIGH (ref 6–20)
CO2: 31 mmol/L (ref 22–32)
Calcium: 8.6 mg/dL — ABNORMAL LOW (ref 8.9–10.3)
Chloride: 96 mmol/L — ABNORMAL LOW (ref 98–111)
Creatinine, Ser: 0.81 mg/dL (ref 0.44–1.00)
GFR calc Af Amer: >60 mL/min (ref >60)
GFR calc non Af Amer: >60 mL/min (ref >60)
Glucose, Bld: 253 mg/dL — ABNORMAL HIGH (ref 70–99)
Potassium: 4.2 mmol/L (ref 3.5–5.1)
Sodium: 136 mmol/L (ref 135–145)

## 2019-06-02 LAB — GLUCOSE, CAPILLARY
Glucose-Capillary: 230 mg/dL — ABNORMAL HIGH (ref 70–99)
Glucose-Capillary: 277 mg/dL — ABNORMAL HIGH (ref 70–99)
Glucose-Capillary: 230 mg/dL — ABNORMAL HIGH (ref 70–99)
Glucose-Capillary: 297 mg/dL — ABNORMAL HIGH (ref 70–99)

## 2019-06-02 MED ORDER — METHYLPREDNISOLONE SODIUM SUCC 125 MG IJ SOLR
60.0000 mg | Freq: Every day | INTRAMUSCULAR | Status: DC
  Administered 2019-06-03: 60 mg via INTRAVENOUS
  Filled 2019-06-02: qty 2

## 2019-06-02 MED ORDER — INSULIN ASPART 100 UNIT/ML ~~LOC~~ SOLN
3.0000 [IU] | Freq: Three times a day (TID) | SUBCUTANEOUS | Status: DC
  Administered 2019-06-02 – 2019-06-03 (×3): 3 [IU] via SUBCUTANEOUS

## 2019-06-02 NOTE — Evaluation (Signed)
Physical Therapy Evaluation Patient Details Name: Felicia [name] MRN: [id number] DOB: 09-17-1970 Today's Date: 06/02/2019   History of Present Illness  Pt is a 49 y/o female who presented with dyspnea, admitted for acute on chornic hypoxic respiratory failure. PMH significant for but not limited to: nonspecific interstitial pneumonitis, DVT, DM 2, plumonary HTN and diastolic heart failure; chronic hypoxic respiratory failure on 12-15L/min [place].     Clinical Impression  Pt presents with significant respiratory dysfunction limiting mobility, noting generalized weakness in extremities, dependent on external support for mobility, and significant desaturation with activity. Noted stable HR in 90's, with RR ranging low 20's to highest at 42 breaths/min; lowest SaO2 of 83%, 3-72mn rebound to 90%. Instructed on breathing strategies to manage anxiety and improve air flow, will need reinforcement. Pt had HHPT prior to admission, recommend resume as pt feels this has been beneficial.  Pt will benefit from PT in acute setting with goal of preventing loss of function prior to return home.      Follow Up Recommendations Home health PT-resume services    Equipment Recommendations  None recommended by PT    Recommendations for Other Services       Precautions / Restrictions Precautions Precautions: Fall Precaution Comments: watch O2, on 15L HFNC  Restrictions Weight Bearing Restrictions: No      Mobility  Bed Mobility Overal bed mobility: Modified Independent             General bed mobility comments: increased time to compensate for low oxygenation (noted decr from 93% to 87% moving to bedside)  Transfers Overall transfer level: Modified independent Equipment used: Rolling walker (2 wheeled)             General transfer comment: increased time once standing to incorporate breathing techniques and remained standing for 3-5 minutes until SaO2 recovered to  90-91%  Ambulation/Gait Ambulation/Gait assistance: Supervision Gait Distance (Feet): 10 Feet Assistive device: Rolling walker (2 wheeled) Gait Pattern/deviations: Step-through pattern;Decreased stride length     General Gait Details: with RW, no need for physical assist, with supervision for line management and cues for breathing; pt takes 1-2 steps and then standing rest break, then resumes; untimed but est 3-5 min to walk 10 feet  Stairs            Wheelchair Mobility    Modified Rankin (Stroke Patients Only)       Balance Overall balance assessment: Mild deficits observed, not formally tested(hx fall due to resp issues going up steep stairs; RW dep.)                                           Pertinent Vitals/Pain Pain Assessment: No/denies pain    Home Living Family/patient expects to be discharged to:: Private residence Living Arrangements: Children(daughter ) Available Help at Discharge: Family;Available PRN/intermittently(daughter works fri, sat, sunday ) Type of Home: House Home Access: Stairs to enter(bought a portable wc ramp ) Entrance Stairs-Rails: Left Entrance Stairs-Number of Steps: 4 Home Layout: Able to live on main level with bedroom/bathroom Home Equipment: Shower seat;Walker - 4 wheels;Transport chair;Toilet riser(portable wheelchair ramp ) Additional Comments: on 12- 15L O2 baseline; looking into electric wc     Prior Function Level of Independence: Needs assistance   Gait / Transfers Assistance Needed: uses rollator for mobility within home   ADL's / Homemaking Assistance Needed: able to complete ADLs with increased  time, daughter assists with IADLs   Comments: works full time from home      Journalist, newspaper        Extremity/Trunk Assessment   Upper Extremity Assessment Upper Extremity Assessment: Generalized weakness    Lower Extremity Assessment Lower Extremity Assessment: Generalized weakness    Cervical /  Trunk Assessment Cervical / Trunk Assessment: Normal  Communication   Communication: No difficulties  Cognition Arousal/Alertness: Awake/alert Behavior During Therapy: WFL for tasks assessed/performed Overall Cognitive Status: Within Functional Limits for tasks assessed                                        General Comments General comments (skin integrity, edema, etc.): 15L O2 per [place] high flow; SaO2 100% on arrival, drops to low 90s with conversation, and to 83 at lowest with initation of mobility; instructed on deep diaphragmatic breathing, strategies for self-cueing breathing during activity, and position/stretching -- needs reinforment    Exercises     Assessment/Plan    PT Assessment Patient needs continued PT services  PT Problem List Cardiopulmonary status limiting activity;Decreased mobility;Decreased balance;Decreased activity tolerance;Decreased strength;Decreased range of motion       PT Treatment Interventions Patient/family education;Therapeutic exercise;Therapeutic activities;Functional mobility training;DME instruction;Gait training;Stair training    PT Goals (Current goals can be found in the Care Plan section)  Acute Rehab PT Goals Patient Stated Goal: better breathing patterns to assist with resp function; how to increase activity with job that requires sitting/computer work all day PT Goal Formulation: With patient Time For Goal Achievement: 06/16/19 Potential to Achieve Goals: Good    Frequency Min 3X/week   Barriers to discharge Other (comment)(needs ramp at either entrance (front v. garage))      Co-evaluation               AM-PAC PT "6 Clicks" Mobility  Outcome Measure Help needed turning from your back to your side while in a flat bed without using bedrails?: None Help needed moving from lying on your back to sitting on the side of a flat bed without using bedrails?: None Help needed moving to and from a bed to a chair  (including a wheelchair)?: A Little Help needed standing up from a chair using your arms (e.g., wheelchair or bedside chair)?: None Help needed to walk in hospital room?: A Little Help needed climbing 3-5 steps with a railing? : A Lot 6 Click Score: 20    End of Session Equipment Utilized During Treatment: Oxygen Activity Tolerance: Patient tolerated treatment well;Other (comment)(in context of severely compromised respiratory function) Patient left: in chair;with call bell/phone within reach;with chair alarm set Nurse Communication: Mobility status;Precautions(instructed RN to cue breathing with back to bed) PT Visit Diagnosis: Difficulty in walking, not elsewhere classified (R26.2);Other (comment)(cardiopulmonary dysfuncton)    Time: 8206-0156 PT Time Calculation (min) (ACUTE ONLY): 36 min   Charges:   PT Evaluation $PT Eval Moderate Complexity: 1 Mod          Kearney Hard, PT, DPT, MS Board Certified Geriatric Clinical Specialist   Felicia [name] 06/02/2019, 10:08 AM

## 2019-06-02 NOTE — Evaluation (Signed)
Occupational Therapy Evaluation Patient Details Name: Felicia [name] MRN: [id number] DOB: 03-07-1970 Today's Date: 06/02/2019    History of Present Illness Pt is a 49 y/o female who presented with dyspnea, admitted for acute on chornic hypoxic respiratory failure. PMH significant for but not limited to: nonspecific interstitial pneumonitis, DVT, DM 2, plumonary HTN and diastolic heart failure; chronic hypoxic respiratory failure on 12-15L/min [place].    Clinical Impression   PTA patient modified independent with ADLs, mobility and limited IADLs; daughter assists as needed.  She was admitted for above and is limited by problem list below, including decreased cardiopulmonary function, genearlized weakness and decreased activity tolerance.  She requires supervision for mobility and transfers, supervision for ADls.  Educated on pursed lip breathing techniques throughout session and requires reinforcement during ADLs, mobility to ensure to keep breathing.  SpO2 on 15L HFNC at 100% upon entry, dropping to low 90s with conversation and 83% during mobility with 3-5 minutes to recover with PLB seated.  RR max to 42, baseline 20s.  Recommend continued OT services while admitted and after dc at [facility] level to optimize independence and safety, tolerance with ADLs, IADLs and mobility.     Follow Up Recommendations  Home health OT;Supervision/Assistance - 24 hour    Equipment Recommendations  None recommended by OT    Recommendations for Other Services       Precautions / Restrictions Precautions Precautions: Fall Precaution Comments: watch O2, on 15L HFNC  Restrictions Weight Bearing Restrictions: No      Mobility Bed Mobility Overal bed mobility: Modified Independent             General bed mobility comments: increased time to compensate for low oxygenation (noted decr from 93% to 87% moving to bedside)  Transfers Overall transfer level: Needs assistance Equipment used: Rolling walker (2  wheeled) Transfers: Sit to/from Stand Sit to Stand: Supervision         General transfer comment: for safety, line mgmt, cueing for PLB     Balance Overall balance assessment: Mild deficits observed, not formally tested                                         ADL either performed or assessed with clinical judgement   ADL Overall ADL's : Needs assistance/impaired     Grooming: Set up;Sitting   Upper Body Bathing: Supervision/ safety;Sitting   Lower Body Bathing: Supervison/ safety;Sit to/from stand   Upper Body Dressing : Supervision/safety;Sitting   Lower Body Dressing: Supervision/safety;Sit to/from stand   Toilet Transfer: Supervision/safety;Ambulation Toilet Transfer Details (indicate cue type and reason): simulated to recliner          Functional mobility during ADLs: Supervision/safety;Rolling walker General ADL Comments: pt limited by decreased activity toelrance and generalized weakness      Vision   Vision Assessment?: No apparent visual deficits     Perception     Praxis      Pertinent Vitals/Pain Pain Assessment: No/denies pain     Hand Dominance     Extremity/Trunk Assessment Upper Extremity Assessment Upper Extremity Assessment: Generalized weakness   Lower Extremity Assessment Lower Extremity Assessment: Generalized weakness   Cervical / Trunk Assessment Cervical / Trunk Assessment: Normal   Communication Communication Communication: No difficulties   Cognition Arousal/Alertness: Awake/alert Behavior During Therapy: WFL for tasks assessed/performed Overall Cognitive Status: Within Functional Limits for tasks assessed  General Comments  pt on 15L O2 via HFNC, SpO2 100% on arrival and drops to 90s with converasation; 83% at the lowest during mobilty; she requires cueing throughout session for PLB, techniques    Exercises     Shoulder Instructions      Home  Living Family/patient expects to be discharged to:: Private residence Living Arrangements: Children(daughter) Available Help at Discharge: Family;Available PRN/intermittently(daughter working fri, sat, sun) Type of Home: House Home Access: Stairs to enter [employer] of Steps: 4 Entrance Stairs-Rails: Left Home Layout: Able to live on main level with bedroom/bathroom     Bathroom Shower/Tub: Occupational psychologist: Standard     Home Equipment: Clinical cytogeneticist - 4 wheels;Transport chair;Toilet riser(portable wc ramp)   Additional Comments: on 12- 15L O2 baseline; looking into electric wc       Prior Functioning/Environment Level of Independence: Needs assistance  Gait / Transfers Assistance Needed: uses rollator for mobility within home  ADL's / Homemaking Assistance Needed: able to complete ADLs with increased time, daughter assists with IADLs    Comments: works full time from home         OT Problem List: Decreased strength;Decreased activity tolerance;Cardiopulmonary status limiting activity;Obesity      OT Treatment/Interventions: Self-care/ADL training;Energy conservation;DME and/or AE instruction;Therapeutic activities;Patient/family education;Balance training    OT Goals(Current goals can be found in the care plan section) Acute Rehab OT Goals Patient Stated Goal: better breathing patterns to assist with resp function; how to increase activity with job that requires sitting/computer work all day OT Goal Formulation: With patient Time For Goal Achievement: 06/16/19 Potential to Achieve Goals: Good ADL Goals Pt Will Perform Grooming: with modified independence;sitting Pt Will Perform Lower Body Dressing: with modified independence;sit to/from stand;with adaptive equipment Pt Will Transfer to Toilet: with modified independence;ambulating Pt Will Perform Toileting - Clothing Manipulation and hygiene: with modified independence;sit to/from  stand Pt/caregiver will Perform Home Exercise Program: Increased strength;Both right and left upper extremity;With written HEP provided;With theraband Additional ADL Goal #1: Patient will demonstrate use of energy conservation and PLB breathing techniques during ADL routine with supervision.  OT Frequency: Min 2X/week   Barriers to D/C:            Co-evaluation              AM-PAC OT "6 Clicks" Daily Activity     Outcome Measure Help from another person eating meals?: None Help from another person taking care of personal grooming?: A Little Help from another person toileting, which includes using toliet, bedpan, or urinal?: A Little Help from another person bathing (including washing, rinsing, drying)?: A Little Help from another person to put on and taking off regular upper body clothing?: A Little Help from another person to put on and taking off regular lower body clothing?: A Little 6 Click Score: 19   End of Session Equipment Utilized During Treatment: Rolling walker Nurse Communication: Mobility status  Activity Tolerance: Patient tolerated treatment well Patient left: in chair;with call bell/phone within reach;with chair alarm set;with nursing/sitter in room  OT Visit Diagnosis: Other abnormalities of gait and mobility (R26.89);Muscle weakness (generalized) (M62.81)                Time: 1660-6301 OT Time Calculation (min): 36 min Charges:  OT General Charges $OT Visit: 1 Visit OT Evaluation $OT Eval Moderate Complexity: 1 Mod  Felicia [name], OT Acute Rehabilitation Services Pager [phone] Office [phone]   Felicia [name] 06/02/2019, 11:52 AM

## 2019-06-02 NOTE — Plan of Care (Signed)
  Problem: Education: Goal: Knowledge of General Education information will improve Description: Including pain rating scale, medication(s)/side effects and non-pharmacologic comfort measures Outcome: Progressing   Problem: Health Behavior/Discharge Planning: Goal: Ability to manage health-related needs will improve Outcome: Progressing   Problem: Clinical Measurements: Goal: Ability to maintain clinical measurements within normal limits will improve Outcome: Progressing Goal: Will remain free from infection Outcome: Progressing Goal: Diagnostic test results will improve Outcome: Progressing Goal: Cardiovascular complication will be avoided Outcome: Progressing   Problem: Nutrition: Goal: Adequate nutrition will be maintained Outcome: Progressing   Problem: Coping: Goal: Level of anxiety will decrease Outcome: Progressing   Problem: Elimination: Goal: Will not experience complications related to urinary retention Outcome: Progressing   Problem: Pain Managment: Goal: General experience of comfort will improve Outcome: Progressing   Problem: Safety: Goal: Ability to remain free from injury will improve Outcome: Progressing  Elesa Hacker, RN

## 2019-06-02 NOTE — Progress Notes (Signed)
PROGRESS NOTE    Felicia [name]  EPP:[id number] DOB: 1971-01-07 DOA: 05/30/2019 PCP: Aretta Nip, MD    Brief Narrative:  Patient was admitted to the hospital with a working diagnosis of acute on chronic hypoxic respiratory failure.  49 year old female who presented with dyspnea. She does have significant past medical history for NSIP,(nonspecific interstitial pneumonitis),history of DVT,type 2 diabetes mellitus, pulmonary hypertension and diastolic heart failure. She does have chronic hypoxic respiratory failure, on home oxygen 12 to 15 L/min per nasal cannula. Over the last few days prior to hospitalization she has noticed worsening dyspnea and increased oxygen requirements, no fevers no chills. On her initial physical examination blood pressure 109/92, pulse rate 106, respiratory rate 36, oxygen saturation 23%. She had moist mucous membranes, her lungs are clear to auscultation bilaterally, poor air movement, heart is also present, rhythmic, abdomen soft, positive lower extremity edema. Sodium 140, potassium 2.5, chloride 96, bicarb 31, glucose 190, BUN 7, creatinine 0.79, white count 11.4, hemoglobin 12.1, hematocrit 41.8, platelets 307.SARS COVID-19 negative.Chest radiograph with bilateral interstitial infiltrates. EKG 106 bpm, right axis deviation, normal intervals, sinus rhythm with PVCs and PACs, no significant ST segment or T wave changes.  Old records personally reviewed, last hospitalization on 04/23/19 to 04/28/19, patient at home on cellcept, oral steroids and bactrim prophylaxis. Her DLCO is 19% pred per last PFT.   Patient has been placed on diuresis, systemic steroids and high flow nasal cannula with slow improvement in her symptoms.     Assessment & Plan:   Principal Problem:   Acute on chronic respiratory failure with hypoxia (HCC) Active Problems:   Essential hypertension   Acute on chronic diastolic CHF (congestive heart failure) (HCC)  Pulmonary HTN (HCC)   Interstitial lung disease (HCC)   Hypokalemia   History of DVT (deep vein thrombosis)   Prolonged Q-T interval on ECG   Hypomagnesemia    1. Acute on chronic hypoxic respiratory failure, ILD, acute on chronic right heart failure/ core pulmonale/ pulmonary hypertension. Urine output over last 24 H is 1,700 ml.  Continue with  improvement on her symptoms. Continue with 15 l/ min per HFNC with oxygen saturation 100%. Echocardiogram from 03/21 with features of RV failure with decreased RV systolic function, flattened interventricular septum on systole, and estimated RV pressure systolic 60.6 mmHg.   Continue with diuresis with oral furosemide 40 mg daily, and systemic steroids, will decrease dose to 60 mg Iv daily. Continue with sildenafil.   For ILD will continue with mycophenolate and prophylactic bactrim. Continue with bronchodilators and airway clearing techniques with flutter valve and incentive spirometer.   Patient will need home health at discharge. Will consult heart failure team in am, patient with very poor prognosis due to RV failure, may consider palliative care.   2. HTN. Stable blood pressure 301 mmHg systolic, continue with atenolol and verapamil.   3. Hx DVT. Continue with apixaban for anticoagulation.    4. Hypokalemia/ chronic metabolic alkalosis.  Corrected electrolytes, chronic metabolic alkalosis, will continue close follow up of renal function and electrolytes.   3. Uncontrolled T2dm (Hgb A1c 8,8). Fasting glucose is 253, patient used about 15 units of short acting insulin, yesterday. Will decrease steroids in 50%, will continue current basal insulin dose of 15 units, and will add 3 units pre-meal aspart insulin.      DVT prophylaxis:apixaban Code Status:full Family Communication:No family at the bedside Disposition Plan: Patient is  from:Home Anticipated DC SW:FUXN Anticipated DC date:To be determined Anticipated DC barriers:Patient on  high oxygen requirements and needing close hemodynamic and oxymetry monitoring. ON IV diuresis.     Subjective: Patient is feeling better, dyspnea continue to improve, no nausea or vomiting, no chest pain. Out of bed to chair.   Objective: Vitals:   06/01/19 2150 06/02/19 0405 06/02/19 0847 06/02/19 1150  BP:  116/70    Pulse:  69    Resp:  (!) 23    Temp:  (!) 97.2 F (36.2 C)    TempSrc:  Oral    SpO2: 100%  97% 99%  Weight:  96 kg    Height:        Intake/Output Summary (Last 24 hours) at 06/02/2019 1202 Last data filed at 06/02/2019 0406 Gross per 24 hour  Intake --  Output 1700 ml  Net -1700 ml   Filed Weights   05/30/19 1845 06/01/19 0453 06/02/19 0405  Weight: 95.7 kg 96 kg 96 kg    Examination:   General: Not in pain. Positive dyspnea at rest, deconditioned  Neurology: Awake and alert, non focal  E ENT: mild pallor, no icterus, oral mucosa moist Cardiovascular: No JVD. S1-S2 present, rhythmic, no gallops, rubs, or murmurs. No lower extremity edema. Pulmonary: positive breath sounds bilaterally, decreased air movement, no wheezing, or rhonchi, bilateral rales. Gastrointestinal. Abdomen with, no organomegaly, non tender, no rebound or guarding Skin. No rashes Musculoskeletal: no joint deformities     Data Reviewed: I have personally reviewed following labs and imaging studies  CBC: Recent Labs  Lab 05/30/19 1937 05/31/19 0400  WBC 11.4* 9.0  NEUTROABS 10.6* 7.6  HGB 12.1 12.1  HCT 41.8 41.3  MCV 91.1 89.8  PLT 307 093   Basic Metabolic Panel: Recent Labs  Lab 05/30/19 1937 05/31/19 0025 05/31/19 0400 06/01/19 0349 06/02/19 0515  NA 140  --  144 140 136  K 2.5*  --  3.8 4.4 4.2  CL 96*  --  100 96* 96*  CO2 31  --  29 32 31   GLUCOSE 190*  --  166* 263* 253*  BUN 7  --  10 16 21*  CREATININE 0.79  --  0.88 0.82 0.81  CALCIUM 7.9*  --  8.3* 8.3* 8.6*  MG  --  1.3* 1.5* 2.0  --    GFR: Estimated Creatinine Clearance: 96.3 mL/min (by C-G formula based on SCr of 0.81 mg/dL). Liver Function Tests: Recent Labs  Lab 05/30/19 1937  AST 23  ALT 28  ALKPHOS 42  BILITOT 0.7  PROT 5.6*  ALBUMIN 3.2*   No results for input(s): LIPASE, AMYLASE in the last 168 hours. No results for input(s): AMMONIA in the last 168 hours. Coagulation Profile: No results for input(s): INR, PROTIME in the last 168 hours. Cardiac Enzymes: No results for input(s): CKTOTAL, CKMB, CKMBINDEX, TROPONINI in the last 168 hours. BNP (last 3 results) No results for input(s): PROBNP in the last 8760 hours. HbA1C: Recent Labs    05/31/19 0400  HGBA1C 8.8*   CBG: Recent Labs  Lab 06/01/19 1136 06/01/19 1631 06/01/19 2034 06/02/19 0821 06/02/19 1200  GLUCAP 289* 254* 389* 230* 277*   Lipid Profile: No results for input(s): CHOL, HDL, LDLCALC, TRIG, CHOLHDL, LDLDIRECT in the last 72 hours. Thyroid Function Tests: No results for input(s): TSH, T4TOTAL, FREET4, T3FREE, THYROIDAB in the last 72 hours. Anemia Panel: No results for input(s): VITAMINB12, FOLATE, FERRITIN, TIBC, IRON, RETICCTPCT in the last 72 hours.    Radiology Studies: I have reviewed all of the imaging during this  hospital visit personally     Scheduled Meds: . apixaban  5 mg Oral BID  . atenolol  75 mg Oral BID  . furosemide  40 mg Oral Daily  . gabapentin  300 mg Oral TID  . guaiFENesin  600 mg Oral Daily  . insulin aspart  0-5 Units Subcutaneous QHS  . insulin aspart  0-9 Units Subcutaneous TID WC  . insulin glargine  15 Units Subcutaneous QHS  . ipratropium-albuterol  3 mL Inhalation QID  . lidocaine  1 patch Transdermal Q24H  . methylPREDNISolone (SOLU-MEDROL) injection  60 mg Intravenous Q12H  . mycophenolate  1,500 mg Oral BID  . Ensure Max  Protein  11 oz Oral Daily  . sildenafil  20 mg Oral BID  . sodium chloride flush  3 mL Intravenous Q12H  . sodium chloride flush  3 mL Intravenous Q12H  . sulfamethoxazole-trimethoprim  1 tablet Oral Once per day on Mon Wed Fri  . verapamil  120 mg Oral QHS   Continuous Infusions: . sodium chloride       LOS: 3 days        Felicia [name] Gerome Apley, MD

## 2019-06-03 ENCOUNTER — Encounter (HOSPITAL_COMMUNITY): Payer: Self-pay | Admitting: Family Medicine

## 2019-06-03 LAB — GLUCOSE, CAPILLARY
Glucose-Capillary: 173 mg/dL — ABNORMAL HIGH (ref 70–99)
Glucose-Capillary: 146 mg/dL — ABNORMAL HIGH (ref 70–99)
Glucose-Capillary: 230 mg/dL — ABNORMAL HIGH (ref 70–99)
Glucose-Capillary: 388 mg/dL — ABNORMAL HIGH (ref 70–99)

## 2019-06-03 LAB — BASIC METABOLIC PANEL
Anion gap: 9 (ref 5–15)
BUN: 27 mg/dL — ABNORMAL HIGH (ref 6–20)
CO2: 35 mmol/L — ABNORMAL HIGH (ref 22–32)
Calcium: 8.9 mg/dL (ref 8.9–10.3)
Chloride: 93 mmol/L — ABNORMAL LOW (ref 98–111)
Creatinine, Ser: 0.88 mg/dL (ref 0.44–1.00)
GFR calc Af Amer: >60 mL/min (ref >60)
GFR calc non Af Amer: >60 mL/min (ref >60)
Glucose, Bld: 238 mg/dL — ABNORMAL HIGH (ref 70–99)
Potassium: 4 mmol/L (ref 3.5–5.1)
Sodium: 137 mmol/L (ref 135–145)

## 2019-06-03 MED ORDER — SILDENAFIL CITRATE 20 MG PO TABS
20.0000 mg | ORAL_TABLET | Freq: Three times a day (TID) | ORAL | Status: DC
  Administered 2019-06-03 – 2019-06-04 (×3): 20 mg via ORAL
  Filled 2019-06-03 (×4): qty 1

## 2019-06-03 MED ORDER — IPRATROPIUM-ALBUTEROL 0.5-2.5 (3) MG/3ML IN SOLN
3.0000 mL | Freq: Three times a day (TID) | RESPIRATORY_TRACT | Status: DC
  Administered 2019-06-03 – 2019-06-04 (×2): 3 mL via RESPIRATORY_TRACT
  Filled 2019-06-03 (×3): qty 3

## 2019-06-03 MED ORDER — INSULIN ASPART 100 UNIT/ML ~~LOC~~ SOLN
5.0000 [IU] | Freq: Three times a day (TID) | SUBCUTANEOUS | Status: DC
  Administered 2019-06-03 – 2019-06-04 (×2): 5 [IU] via SUBCUTANEOUS

## 2019-06-03 MED ORDER — TREPROSTINIL 0.6 MG/ML IN SOLN
18.0000 ug | Freq: Four times a day (QID) | RESPIRATORY_TRACT | Status: DC
  Filled 2019-06-03: qty 2.9

## 2019-06-03 MED ORDER — FUROSEMIDE 10 MG/ML IJ SOLN
80.0000 mg | Freq: Once | INTRAMUSCULAR | Status: AC
  Administered 2019-06-03: 80 mg via INTRAVENOUS
  Filled 2019-06-03: qty 8

## 2019-06-03 NOTE — Progress Notes (Addendum)
PROGRESS NOTE    Felicia [name]  QTM:[id number] DOB: 06/02/1970 DOA: 05/30/2019 PCP: Aretta Nip, MD    Brief Narrative:  Patient was admitted to the hospital with a working diagnosis of acute on chronic hypoxic respiratory failure.  49 year old female who presented with dyspnea. She does have significant past medical history for NSIP,(nonspecific interstitial pneumonitis),history of DVT,type 2 diabetes mellitus, pulmonary hypertension and diastolic heart failure. She does have chronic hypoxic respiratory failure, on home oxygen 12 to 15 L/min per nasal cannula. Over the last few days prior to hospitalization she has noticed worsening dyspnea and increased oxygen requirements, no fevers no chills. On her initial physical examination blood pressure 109/92, pulse rate 106, respiratory rate 36, oxygen saturation 23%. She had moist mucous membranes, her lungs are clear to auscultation bilaterally, poor air movement, heart is also present, rhythmic, abdomen soft, positive lower extremity edema. Sodium 140, potassium 2.5, chloride 96, bicarb 31, glucose 190, BUN 7, creatinine 0.79, white count 11.4, hemoglobin 12.1, hematocrit 41.8, platelets 307.SARS COVID-19 negative.Chest radiograph with bilateral interstitial infiltrates. EKG 106 bpm, right axis deviation, normal intervals, sinus rhythm with PVCs and PACs, no significant ST segment or T wave changes.  Old records personally reviewed, last hospitalization on 04/23/19 to 04/28/19, patient at home on cellcept, oral steroids and bactrim prophylaxis. Her DLCO is 19% pred per last PFT.  Patient has been placed on diuresis, systemic steroids and high flow nasal cannula with slow improvement in her symptoms.    Assessment & Plan:   Principal Problem:   Acute on chronic respiratory failure with hypoxia (HCC) Active Problems:   Essential hypertension   Acute on chronic diastolic CHF (congestive heart failure) (HCC)  Pulmonary HTN (HCC)   Interstitial lung disease (HCC)   Hypokalemia   History of DVT (deep vein thrombosis)   Hypomagnesemia    1. Acute on chronic hypoxic respiratory failure, ILD, acute on chronic right heart failure/ core pulmonale/ pulmonary hypertension.Echocardiogram from 03/21 with features of RV failure with decreased RV systolic function, flattened interventricular septum on systole, and estimated RV pressure systolic 62.5 mmHg.   Urine output over last 24 H is 550 ml. Reported continue  improvement on her symptoms. On 15 l/ min per HFNC with oxygen saturation 100%.   For pulmonary hypertension will continue sildenafil and gentle diuresis with furosemide, clinically seems to be eu-volemic. Patient with advance right heart failure, with very poor prognosis. Consulted heart failure team for further recommendations while hospitalized.    For ILD on mycophenolate and prophylactic bactrim, bronchodilators and airway clearing techniques with flutter valve and incentive spirometer. At home patient on 15 L per min per [place]. Continue slow taper of systemic steroids.    2. HTN. Continue withatenolol and verapamil with good toleration.   3. Hx DVT.On apixaban for anticoagulation.  4. Hypokalemia/ chronic metabolic alkalosis. electrolytes corrected, with K at 4,0, bicarb chronic elevation at 35. Will continue diuresis with furosemide.   3. Uncontrolled T2dm (Hgb A1c 8,8).Fasting glucose is 238, contineu with basal insulin dose of 15 units, and pre-meal insulin increase to 5 units. Continue to taper steroids.     DVT prophylaxis:apixaban Code Status:full Family Communication:No family at the bedside Disposition Plan: Patient is from:Home Anticipated DC WL:SLHT Anticipated DC date:04/27 Anticipated DC barriers:Patient on high  oxygen requirements and needing close hemodynamic and oxymetry monitoring.     Consultants:   Cardiology heart failure team    Subjective: Patient is feeling better, close to her baseline, but not there yet, no  nausea or vomiting, has been out of bed. Continue with 15 L per [place] flow.   Objective: Vitals:   06/02/19 2009 06/02/19 2110 06/03/19 0401 06/03/19 0751  BP: 113/67 114/66 109/74   Pulse: 86 90 61   Resp: (!) 23  18   Temp:   98.5 F (36.9 C)   TempSrc:   Oral   SpO2: 94%  95% 100%  Weight:   96 kg   Height:        Intake/Output Summary (Last 24 hours) at 06/03/2019 1111 Last data filed at 06/03/2019 0402 Gross per 24 hour  Intake --  Output 550 ml  Net -550 ml   Filed Weights   06/01/19 0453 06/02/19 0405 06/03/19 0401  Weight: 96 kg 96 kg 96 kg    Examination:   General: Not in pain or dyspnea, deconditioned  Neurology: Awake and alert, non focal  E ENT: no pallor, no icterus, oral mucosa moist Cardiovascular: No JVD. S1-S2 present, rhythmic, no gallops, rubs, or murmurs. No lower extremity edema. Pulmonary: positive breath sounds bilaterally, decreased air movement, no wheezing, or rhonchi, scatttered rales. Gastrointestinal. Abdomen with, no organomegaly, non tender, no rebound or guarding Skin. No rashes Musculoskeletal: no joint deformities     Data Reviewed: I have personally reviewed following labs and imaging studies  CBC: Recent Labs  Lab 05/30/19 1937 05/31/19 0400  WBC 11.4* 9.0  NEUTROABS 10.6* 7.6  HGB 12.1 12.1  HCT 41.8 41.3  MCV 91.1 89.8  PLT 307 250   Basic Metabolic Panel: Recent Labs  Lab 05/30/19 1937 05/31/19 0025 05/31/19 0400 06/01/19 0349 06/02/19 0515 06/03/19 0255  NA 140  --  144 140 136 137  K 2.5*  --  3.8 4.4 4.2 4.0  CL 96*  --  100 96* 96* 93*  CO2 31  --  29 32 31 35*  GLUCOSE 190*  --  166* 263* 253* 238*  BUN 7  --  10 16 21* 27*  CREATININE 0.79  --  0.88 0.82 0.81 0.88  CALCIUM 7.9*   --  8.3* 8.3* 8.6* 8.9  MG  --  1.3* 1.5* 2.0  --   --    GFR: Estimated Creatinine Clearance: 88.6 mL/min (by C-G formula based on SCr of 0.88 mg/dL). Liver Function Tests: Recent Labs  Lab 05/30/19 1937  AST 23  ALT 28  ALKPHOS 42  BILITOT 0.7  PROT 5.6*  ALBUMIN 3.2*   No results for input(s): LIPASE, AMYLASE in the last 168 hours. No results for input(s): AMMONIA in the last 168 hours. Coagulation Profile: No results for input(s): INR, PROTIME in the last 168 hours. Cardiac Enzymes: No results for input(s): CKTOTAL, CKMB, CKMBINDEX, TROPONINI in the last 168 hours. BNP (last 3 results) No results for input(s): PROBNP in the last 8760 hours. HbA1C: No results for input(s): HGBA1C in the last 72 hours. CBG: Recent Labs  Lab 06/02/19 0821 06/02/19 1200 06/02/19 1621 06/02/19 2032 06/03/19 0751  GLUCAP 230* 277* 230* 297* 173*   Lipid Profile: No results for input(s): CHOL, HDL, LDLCALC, TRIG, CHOLHDL, LDLDIRECT in the last 72 hours. Thyroid Function Tests: No results for input(s): TSH, T4TOTAL, FREET4, T3FREE, THYROIDAB in the last 72 hours. Anemia Panel: No results for input(s): VITAMINB12, FOLATE, FERRITIN, TIBC, IRON, RETICCTPCT in the last 72 hours.    Radiology Studies: I have reviewed all of the imaging during this hospital visit personally     Scheduled Meds: . apixaban  5 mg Oral  BID  . atenolol  75 mg Oral BID  . furosemide  40 mg Oral Daily  . gabapentin  300 mg Oral TID  . guaiFENesin  600 mg Oral Daily  . insulin aspart  0-5 Units Subcutaneous QHS  . insulin aspart  0-9 Units Subcutaneous TID WC  . insulin aspart  3 Units Subcutaneous TID WC  . insulin glargine  15 Units Subcutaneous QHS  . ipratropium-albuterol  3 mL Inhalation QID  . lidocaine  1 patch Transdermal Q24H  . methylPREDNISolone (SOLU-MEDROL) injection  60 mg Intravenous Daily  . mycophenolate  1,500 mg Oral BID  . Ensure Max Protein  11 oz Oral Daily  . sildenafil  20 mg  Oral BID  . sodium chloride flush  3 mL Intravenous Q12H  . sulfamethoxazole-trimethoprim  1 tablet Oral Once per day on Mon Wed Fri  . verapamil  120 mg Oral QHS   Continuous Infusions:   LOS: 4 days        Emeka Lindner Gerome Apley, MD

## 2019-06-03 NOTE — Progress Notes (Signed)
Inpatient Diabetes Program Recommendations  AACE/ADA: New Consensus Statement on Inpatient Glycemic Control (2015)  Target Ranges:  Prepandial:   less than 140 mg/dL      Peak postprandial:   less than 180 mg/dL (1-2 hours)      Critically ill patients:  140 - 180 mg/dL   Lab Results  Component Value Date   GLUCAP 173 (H) 06/03/2019   HGBA1C 8.8 (H) 05/31/2019    Review of Glycemic Control Results for GAYE, SCORZA (MRN [id number]) as of 06/03/2019 10:51  Ref. Range 06/02/2019 08:21 06/02/2019 12:00 06/02/2019 16:21 06/02/2019 20:32 06/03/2019 07:51  Glucose-Capillary Latest Ref Range: 70 - 99 mg/dL 230 (H)  Novolog 3 units  277 (H)  Novolog 5 units 230 (H)  Novolog 6 units 297 (H)  Novolog 3 units 173 (H)   Diabetes history: DM 2 Outpatient Diabetes medications: Tresiba 24 units Daily, Humalog 0-20 units bid, Metformin 1000 mg Daily at supper Current orders for Inpatient glycemic control:  Lantus 15 units Novolog 0-9 units tid + hs Novolog 3 units tid  Solumedrol 60 mg Daily  Inpatient Diabetes Program Recommendations:    - Consider increasing Lantus to 18 units  - Consider increasing Novolog meal coverage to 5 units tid.  Thanks,  Tama Headings RN, MSN, BC-ADM Inpatient Diabetes Coordinator Team Pager [phone] (8a-5p)

## 2019-06-03 NOTE — Consult Note (Addendum)
Cardiology Consultation:   Patient ID: Felicia [name]; [id number]; 21-Jul-1970   Admit date: 05/30/2019 Date of Consult: 06/03/2019  Primary Care Provider: Aretta Nip, MD Primary Cardiologist: Felicia Breeding, MD 03/20/2018 Primary Electrophysiologist:  None   Patient Profile:   Felicia [name] is a 49 y.o. female with a hx of D-CHF, HTN, DM, SVT, resp failure 2018, pulm HTN, ILD/non-specific interstitial pneumonitis on 10-15 L HFNC, DVT on Eliquis, who is being seen today for the evaluation of CHF at the request of Felicia [name].  History of Present Illness:   Felicia. [name] was hospitalized 03/16-03/21/2021 for worsening SOB. Seen by Felicia Felicia [name]: from his note from 03/19 1. Acute on chronic hypoxemic respiratory failure: Multifactorial. Has ILD/NSIP with possible flare as well as pulmonary hypertension/RV failure. At baseline, on 10-15 L HFNC. She has felt better with diuresis and IV Solumedrol.  - Continue treatment of ILD flare per pulmonary, on Solumedrol.  - RHC today showed optimized filling pressure, would start back on Lasix 60 mg po bid tonight morning.  2. Pulmonary hypertension: She has been followed at [name], thought to have mixed group 1 and group 3 (ILD/NSIP) PH. She has been on sildenafil and Tyvaso. INCREASE study would suggest benefit for Tyvaso in patients like her. She does not like Tyvaso because it seems to trigger coughing, and she wants to stop it. Echo shows D-shaped interventricular septum with moderately dilated and mod-severely dysfunctional RV with estimated PASP 66 mmHg, bubble study negative. The IVC is not dilated. RHC today showed moderate-range pulmonary arterial hypertension but PVR was high at 7.45 and cardiac index low at 1.9.  She had Tyvaso last night but not this morning.  - Continue sildenafil.  - I have talked with her about restarting Tyvaso for now. I do not want her to get rebound elevation in her PA pressure and we do not have many  options for getting her on additional therapy acutely. She agrees to restart Tyvaso (may do better with it now that she is diuresed, no coughing when she took it last night).  - Ultimately, will need to get her back to her [facility] MD at [name] after discharge for possible therapy adjustment, do not think that she acutely needs parenteral PAH therapy.  3. RV failure: She is now well-diuresed by Felicia [name].  She is off IV Lasix.  - Start Lasix 60 mg po bid for home, can start this evening.   4. Shingles: Now scabbed over, she is off precautions.  5. H/o SVT: On verapamil and atenolol, quiescent.  6. H/o DVT in 2020: On Eliquis 5 mg bid. Restart tonight post-cath.  7. ILD: NSIP. She is on steroids + Cellcept at home, followed at Felicia [name] as outpatient. She had last Rituximab infusion in 1/21.  PFTs 02/2017 FVC 1.25 L 36% predicted, FEV1 1.18 L 40% predicted with DLCO of 17  Felicia [name] changed pt from Rituxin to [facility], is to get Trexima 06/24/2019.  Per note 01/25/2019, Truxima substituted for Rituxan  Virtual visit with Felicia. Gilles [name] on 3/30.  Was taking the sildenafil twice a day, asked to increase to 3 times daily.  Was asked to use the treprostinil 4 times a day, decrease if she has to, but try to work her way back up to 5 or 6 breaths each time.  Her weight was 216 pounds.  She has been watching what she eats carefully.  She is trying to stick more tightly to a diabetic diet.  She is trying to limit fluids  as well.  She has been taking her Lasix as 2 tablets 3 times a day.  She feels her shortness of breath got worse gradually.  She does not weigh herself every day, but weighs regularly.  Her weight is actually decreased from her last [name] visit.  She noticed some lower extremity edema, but the main thing that she noticed was that her oxygen saturation would not stay up.  Home health nurse is visiting her and the [facility] RN could not get her O2 saturation over 85%.  She was feeling more short of breath and that is  why she came to the [name].  She was also struggling with her breathing because of neuralgia from where she had shingles on the left side of her chest.  The lesions have healed, but she is having significant nerve pain.  Gabapentin is not helping that much and has unwelcome side effects.  In the [name] on IV Lasix, she has put out at least 3 L and is breathing better.  Her O2 saturation currently is 95% on high flow nasal cannula.  With the diuresis, she feels her breathing is about back to baseline.   Past Medical History:  Diagnosis Date  . Diabetes mellitus without complication (Felicia [name])    type 2   . Hypertension   . NSIP (nonspecific interstitial pneumonitis) (Felicia [name])   . Pulmonary hypertension (Felicia [name]) 2011  . Respiratory failure with hypoxia (Felicia [name]) 05/31/2019  . SVT (supraventricular tachycardia) (HCC)     Past Surgical History:  Procedure Laterality Date  . CESAREAN SECTION    . LUNG BIOPSY    . RIGHT HEART CATH N/A 04/26/2019   Procedure: RIGHT HEART CATH;  Surgeon: Larey Dresser, MD;  Location: [facility];  Service: Cardiovascular;  Laterality: N/A;     Prior to Admission medications   Medication Sig Start Date End Date Taking? Authorizing Provider  acetaminophen (TYLENOL) 500 MG tablet Take 2,000 mg by mouth as needed for moderate pain.    Yes [provider]  albuterol (PROVENTIL HFA;VENTOLIN HFA) 108 (90 Base) MCG/ACT inhaler Inhale 1 puff into the lungs every 6 (six) hours as needed for wheezing or shortness of breath.   Yes [provider]  apixaban (ELIQUIS) 5 MG TABS tablet Take 1 tablet (5 mg total) by mouth 2 (two) times daily. 03/12/19 05/30/19 Yes Olalere, Adewale A, MD  atenolol (TENORMIN) 50 MG tablet Take 75 mg by mouth 2 (two) times daily.  01/08/17  Yes [provider]  cholecalciferol (VITAMIN D3) 25 MCG (1000 UT) tablet Take 1,000 Units by mouth daily.   Yes [provider]  furosemide (LASIX) 20 MG tablet Take 3  tablets (60 mg total) by mouth 2 (two) times daily. 04/28/19 06/27/19 Yes Kyle, Tyrone A, DO  gabapentin (NEURONTIN) 300 MG capsule Take 1 capsule (300 mg total) by mouth 3 (three) times daily for 7 days. 04/28/19 05/30/19 Yes Kyle, Tyrone A, DO  guaiFENesin (MUCINEX) 600 MG 12 hr tablet Take 600 mg by mouth daily.    Yes [provider]  insulin degludec (TRESIBA FLEXTOUCH) 100 UNIT/ML FlexTouch Pen Inject 24 Units into the skin daily.   Yes [provider]  insulin lispro (HUMALOG KWIKPEN) 100 UNIT/ML KwikPen Inject 0-20 Units into the skin 2 (two) times daily. Sliding scale based on sugar level   Yes [provider]  ipratropium-albuterol (DUONEB) 0.5-2.5 (3) MG/3ML SOLN Inhale 3 mLs into the lungs every 6 (six) hours as needed (sob and wheezing).  01/08/17  Yes [provider]  metFORMIN (GLUCOPHAGE-XR) 500 MG 24 hr tablet Take 1,000 mg by mouth at bedtime.  04/10/19  Yes [provider]  mycophenolate (CELLCEPT) 500 MG tablet Take 1,500 mg by mouth 2 (two) times daily.    Yes [provider]  naproxen sodium (ALEVE) 220 MG tablet Take 440 mg by mouth as needed (pain).   Yes [provider]  potassium chloride SA (KLOR-CON) 20 MEQ tablet Take 2 tablets (40 mEq total) by mouth daily. 04/29/19 05/30/19 Yes Kyle, Tyrone A, DO  predniSONE (DELTASONE) 10 MG tablet Take 40 mg by mouth daily with breakfast.   Yes [provider]  sildenafil (REVATIO) 20 MG tablet Take 20 mg by mouth 2 (two) times daily. 04/15/19  Yes [provider]  sulfamethoxazole-trimethoprim (BACTRIM DS) 800-160 MG tablet Take 1 tablet by mouth 3 (three) times a week. 12/24/18  Yes [provider]  verapamil (CALAN-SR) 120 MG CR tablet Take 1 tablet (120 mg total) by mouth at bedtime. 09/23/14  Yes Etta Quill, NP    Inpatient Medications: Scheduled Meds: . apixaban  5 mg Oral BID  . atenolol  75 mg Oral BID  . furosemide  40 mg Oral Daily  .  gabapentin  300 mg Oral TID  . guaiFENesin  600 mg Oral Daily  . insulin aspart  0-5 Units Subcutaneous QHS  . insulin aspart  0-9 Units Subcutaneous TID WC  . insulin aspart  5 Units Subcutaneous TID WC  . insulin glargine  15 Units Subcutaneous QHS  . ipratropium-albuterol  3 mL Inhalation TID  . lidocaine  1 patch Transdermal Q24H  . methylPREDNISolone (SOLU-MEDROL) injection  60 mg Intravenous Daily  . mycophenolate  1,500 mg Oral BID  . Ensure Max Protein  11 oz Oral Daily  . sildenafil  20 mg Oral BID  . sodium chloride flush  3 mL Intravenous Q12H  . sulfamethoxazole-trimethoprim  1 tablet Oral Once per day on Mon Wed Fri  . verapamil  120 mg Oral QHS   Continuous Infusions:  PRN Meds: acetaminophen **OR** acetaminophen, albuterol, diphenhydrAMINE-zinc acetate, HYDROcodone-acetaminophen, senna-docusate  Allergies:   No Known Allergies  Social History:   Social History   Socioeconomic History  . Marital status: Single    Spouse name: Not on file  . Number of children: Not on file  . Years of education: Not on file  . Highest education level: Not on file  Occupational History  . Not on file  Tobacco Use  . Smoking status: Never Smoker  . Smokeless tobacco: Never Used  . Tobacco comment: never smoked or used tobacco products  Substance and Sexual Activity  . Alcohol use: No  . Drug use: No  . Sexual activity: Not on file  Other Topics Concern  . Not on file  Social History Narrative   One daugther.  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     Social Determinants of Health   Financial Resource Strain:   . Difficulty of Paying Living Expenses:   Food Insecurity:   . Worried About Charity fundraiser in the Last Year:   . Arboriculturist in the Last Year:   Transportation Needs:   . Film/video editor (Medical):   [name] Lack of Transportation (Non-Medical):   Physical Activity:   . Days of Exercise per Week:   . Minutes of Exercise per Session:   Stress:   . Feeling of  Stress :   Social  Connections:   . Frequency of Communication with Friends and Family:   . Frequency of Social Gatherings with Friends and Family:   . Attends Religious Services:   . Active Member of Clubs or Organizations:   . Attends Archivist Meetings:   [name] Marital Status:   Intimate Partner Violence:   . Fear of Current or Ex-Partner:   . Emotionally Abused:   [name] Physically Abused:   . Sexually Abused:     Family History:   Family History  Problem Relation Age of Onset  . Hypertension Mother   . Cancer Father   . Diabetes Father   . Hypertension Father   . Diabetes Sister   . Mental retardation Sister   . Cancer Maternal Grandmother   . Cancer Maternal Grandfather   . Cancer Paternal Grandmother   . Cancer Paternal Grandfather    Family Status:  Family Status  Relation Name Status  . Mother  Deceased  . Father  Deceased  . Sister  (Not Specified)  . MGM  Deceased  . MGF  Deceased  . PGM  Deceased  . PGF  Deceased    ROS:  Please see the history of present illness.  All other ROS reviewed and negative.     Physical Exam/Data:   Vitals:   06/03/19 0751 06/03/19 1237 06/03/19 1253 06/03/19 1255  BP:   105/69   Pulse:   79   Resp:   (!) 27 19  Temp:   97.9 F (36.6 C)   TempSrc:   Oral   SpO2: 100% 96% 97%   Weight:      Height:        Intake/Output Summary (Last 24 hours) at 06/03/2019 1547 Last data filed at 06/03/2019 0402 Gross per 24 hour  Intake --  Output 550 ml  Net -550 ml    Last 3 Weights 06/03/2019 06/02/2019 06/01/2019  Weight (lbs) 211 lb 10.3 oz 211 lb 10.3 oz 211 lb 10.3 oz  Weight (kg) 96 kg 96 kg 96 kg     Body mass index is 35.22 kg/m.   General:  Well nourished, well developed, female in no acute distress HEENT: normal Lymph: no adenopathy Neck: JVD -mild elevation Endocrine:  No thryomegaly Vascular: No carotid bruits; 4/4 extremity pulses 2+  Cardiac:  normal S1, S2; RRR; no murmur Lungs: Rales bilaterally, no  wheezing, rhonchi   Abd: soft, nontender, no hepatomegaly  Ext: no edema Musculoskeletal:  No deformities, BUE and BLE strength normal and equal Skin: warm and dry  Neuro:  CNs 2-12 intact, no focal abnormalities noted Psych:  Normal affect   EKG:  The EKG was personally reviewed and demonstrates: 4/23 ECG is sinus rhythm, right bundle branch block, heart rate, change in 04/23/2019 Telemetry:  Telemetry was personally reviewed and demonstrates: Sinus rhythm, sinus tach with 4 beats of VT   CV studies:   ECHO: 04/25/2019 1. The left ventricle has normal function. There is the interventricular  septum is flattened in systole and diastole, consistent with right  ventricular pressure and volume overload.  2. Right ventricular systolic function is moderately reduced. The right  ventricular size is moderately enlarged. There is severely elevated  pulmonary artery systolic pressure. The estimated right ventricular  systolic pressure is 83.4 mmHg.  3. Right atrial size was mildly dilated.  4. Tricuspid valve regurgitation is moderate.  5. The aortic valve is grossly normal. Aortic valve regurgitation is not  visualized.  6. The inferior vena  cava is normal in size with greater than 50%  respiratory variability, suggesting right atrial pressure of 3 mmHg.  7. Agitated saline contrast bubble study was negative, with no evidence  of any interatrial shunt.   CATH: R heart cath 04/26/2019 Right Heart Pressures RHC Procedural Findings: Hemodynamics (mmHg) RA mean 7 RV 60/12 PA 60/24, mean 39 PCWP mean 10  Oxygen saturations: SVC 60% PA 61% AO 100%  Cardiac Output (Fick) 3.89  Cardiac Index (Fick) 1.91 PVR 7.45 WU   ECHO: 02/03/2017 - Left ventricle: The cavity size was normal. Systolic function was  normal. The estimated ejection fraction was in the range of 55%  to 60%. Wall motion was normal; there were no regional wall  motion abnormalities. Doppler parameters are  consistent with  abnormal left ventricular relaxation (grade 1 diastolic  dysfunction).  - Ventricular septum: The contour showed diastolic flattening and  systolic flattening.  - Mitral valve: There was no regurgitation.  - Right ventricle: The cavity size was severely dilated. Wall  thickness was normal. Systolic function was moderately reduced.  - Right atrium: The atrium was moderately dilated.  - Tricuspid valve: There was moderate regurgitation.  - Pulmonic valve: There was trivial regurgitation.  - Pulmonary arteries: Systolic pressure was moderately to severely  increased. PA peak pressure: 70 mm Hg (S).  - Inferior vena cava: The vessel was dilated. The respirophasic  diameter changes were blunted (< 50%), consistent with elevated  central venous pressure.  - Pericardium, extracardiac: There was no pericardial effusion.    Laboratory Data:   Chemistry Recent Labs  Lab 06/01/19 0349 06/02/19 0515 06/03/19 0255  NA 140 136 137  K 4.4 4.2 4.0  CL 96* 96* 93*  CO2 32 31 35*  GLUCOSE 263* 253* 238*  BUN 16 21* 27*  CREATININE 0.82 0.81 0.88  CALCIUM 8.3* 8.6* 8.9  GFRNONAA >60 >60 >60  GFRAA >60 >60 >60  ANIONGAP _0 Lab Results  Component Value Date   ALT 28 05/30/2019   AST 23 05/30/2019   ALKPHOS 42 05/30/2019   BILITOT 0.7 05/30/2019   Hematology Recent Labs  Lab 05/30/19 1937 05/31/19 0400  WBC 11.4* 9.0  RBC 4.59 4.60  HGB 12.1 12.1  HCT 41.8 41.3  MCV 91.1 89.8  MCH 26.4 26.3  MCHC 28.9* 29.3*  RDW 19.9* 20.0*  PLT 307 332   Cardiac Enzymes High Sensitivity Troponin:  No results for input(s): TROPONINIHS in the last 720 hours.    BNP Recent Labs  Lab 05/31/19 0025  BNP 1,085.4*    TSH:  Lab Results  Component Value Date   TSH 3.678 09/23/2014   Lipids:No results found for: CHOL, HDL, LDLCALC, LDLDIRECT, TRIG, CHOLHDL HgbA1c: Lab Results  Component Value Date   HGBA1C 8.8 (H) 05/31/2019   Magnesium:    Magnesium  Date Value Ref Range Status  06/01/2019 2.0 1.7 - 2.4 mg/dL Final    Comment:    Performed at [name], [place] [address], [place], [place] [address]     Radiology/Studies:  [place] [name] Chest Port 1 View  Result Date: 05/30/2019 CLINICAL DATA:  Shortness of breath EXAM: PORTABLE CHEST 1 VIEW COMPARISON:  04/23/2019 FINDINGS: Moderate diffuse interstitial opacity, unchanged. This is consistent with known interstitial lung disease. No pleural effusion or pneumothorax. No focal airspace consolidation. IMPRESSION: Unchanged appearance of interstitial lung disease. Electronically Signed   By: Ulyses Jarred M.D.   On: 05/30/2019 19:40  Assessment and Plan:   1.  Acute on chronic respiratory failure -Her respiratory failure is combination of chronic lung disease and CHF -However, her O2 saturation has improved with diuresis  2.  Acute on chronic diastolic CHF -Intake/output are incomplete I do not believe the weights are accurate -However, she has put out at least 3 L of urine and her respiratory status is improved -Last dose of IV Lasix was 4/24 AM, got p.o. Lasix yesterday and today -Suspect she would do better with different Lasix dosing, she was taking 20 mg 3 times daily and is currently on 40 mg once a day  3.  Pulmonary hypertension: -She had been on the sildenafil at 2 times a day, Felicia. Gilles [name] asked her to increase it to 3 times daily - she did so, but it was on her home med list as 2 x day, will increase - Do not see the treprostinil on her list at all, she was supposed to be using that 4 x daily, 7 puffs each. However, was not using it that consistently - tried to restart it here, but [name] does not have the machine (they have the drug) - she has been off it since last Thursday, Felicia Haroldine Laws to determine how/when to restart  4.  History of DVT: -Continue Eliquis  5.  Hypertension: -Her blood pressure is well controlled on current therapy  6.   Hypokalemia: -Her potassium has been supplemented and has stayed stable with diuresis  7.  Hypomagnesemia: -Her magnesium level was 1.5 on admission, supplemented and improved  Principal Problem:   Acute on chronic respiratory failure with hypoxia (HCC) Active Problems:   Essential hypertension   Acute on chronic diastolic CHF (congestive heart failure) (HCC)   Pulmonary HTN (HCC)   Interstitial lung disease (HCC)   Hypokalemia   History of DVT (deep vein thrombosis)   Hypomagnesemia     For questions or updates, please contact [facility] Please consult [email or web address] for contact info under Cardiology/STEMI.   Signed, Rosaria Ferries, PA-C  06/03/2019 3:47 PM   Patient seen and examined with the above-signed Advanced Practice Provider and/or Housestaff. I personally reviewed laboratory data, imaging studies and relevant notes. I independently examined the patient and formulated the important aspects of the plan. I have edited the note to reflect any of my changes or salient points. I have personally discussed the plan with the patient and/or family.   Difficult situation. 49 y/o morbidly obese woman with severe ILD (DLCO 19%), PAH and diastolic HF.   Has been followed closely by Felicia. Gilles [name] at [facility] in the [facility] has also been evaluated by lung transplant team but has not met BMI criteria.   Admitted with respiratory failure in March and had RHC with mean PAP 39 and PCWP 10.   Saw Felicia. Gilles [name] in f/u and was restarted on Tyvaso (in addition to tadalafil and ambriesentan),   Has been on lasix tid and says it is hard to keep up with it because of having to go to the bathroom all the time.   Readmitted with worsening respiratory status. Denies f/c. On admit, BNP was markedly elevated and symptoms have improved with diuresis.   General:  Obese woman sitting in chair. No resp difficulty HEENT: normal Neck: supple. JVP to jaw  Carotids 2+ bilat; no bruits. No lymphadenopathy or  thryomegaly appreciated. Cor: PMI nondisplaced. Mildly irregular 2/6 TR Lungs: diffuse fine crackles  Abdomen: obese soft, nontender, nondistended. No hepatosplenomegaly. No bruits or masses.  Good bowel sounds. Extremities: no cyanosis, clubbing, rash, tr edema Neuro: alert & orientedx3, cranial nerves grossly intact. moves all 4 extremities w/o difficulty. Affect pleasant  Suspect current decompensation related to fluid overload on top of severe parenchymal lung disease and mild to moderate PAH. She says she is now back to baseline and insists on going home in the am.   We will give one more dose of IV lasix tonight. On d/c would continue Tyvaso (and other PAH meds). Switch lasix from 40 tid to 60 bid (7am and 1pm).   F/u with [name].   We will sign off. Please call with questions.   Glori Bickers, MD  6:12 PM

## 2019-06-04 DIAGNOSIS — Z6835 Body mass index (BMI) 35.0-35.9, adult: Secondary | ICD-10-CM

## 2019-06-04 DIAGNOSIS — E6609 Other obesity due to excess calories: Secondary | ICD-10-CM

## 2019-06-04 LAB — BASIC METABOLIC PANEL
Anion gap: 12 (ref 5–15)
BUN: 30 mg/dL — ABNORMAL HIGH (ref 6–20)
CO2: 34 mmol/L — ABNORMAL HIGH (ref 22–32)
Calcium: 8.5 mg/dL — ABNORMAL LOW (ref 8.9–10.3)
Chloride: 90 mmol/L — ABNORMAL LOW (ref 98–111)
Creatinine, Ser: 0.88 mg/dL (ref 0.44–1.00)
GFR calc Af Amer: >60 mL/min (ref >60)
GFR calc non Af Amer: >60 mL/min (ref >60)
Glucose, Bld: 262 mg/dL — ABNORMAL HIGH (ref 70–99)
Potassium: 3.7 mmol/L (ref 3.5–5.1)
Sodium: 136 mmol/L (ref 135–145)

## 2019-06-04 LAB — GLUCOSE, CAPILLARY
Glucose-Capillary: 194 mg/dL — ABNORMAL HIGH (ref 70–99)
Glucose-Capillary: 163 mg/dL — ABNORMAL HIGH (ref 70–99)

## 2019-06-04 MED ORDER — FUROSEMIDE 40 MG PO TABS: 60.0000 mg | ORAL_TABLET | Freq: Two times a day (BID) | ORAL | Status: DC

## 2019-06-04 MED ORDER — PREDNISONE 20 MG PO TABS
40.0000 mg | ORAL_TABLET | Freq: Every day | ORAL | Status: DC
  Administered 2019-06-04: 40 mg via ORAL
  Filled 2019-06-04: qty 2

## 2019-06-04 MED ORDER — FUROSEMIDE 20 MG PO TABS: 60.0000 mg | ORAL_TABLET | Freq: Two times a day (BID) | ORAL | 0 refills | Status: DC

## 2019-06-04 NOTE — Discharge Summary (Signed)
Physician Discharge Summary  Felicia [name] KPQ:[id number] DOB: April 07, 1970 DOA: 05/30/2019  PCP: Aretta Nip, MD  Admit date: 05/30/2019 Discharge date: 06/04/2019  Admitted From: Home  Disposition:  Home   Recommendations for Outpatient Follow-up and new medication changes:  1. Follow up with Dr. Radene Ou in 7 days.  2. Furosemide increased to 60 mg po bid 3. Continue prednisone 40 mg daily until follow up with pulmonary.  4. To resume Tyvaso as outpatient.   Home Health: yes   Equipment/Devices: home 02  Discharge Condition: stable CODE STATUS: full  Diet recommendation: heart healthy   Brief/Interim Summary: Patient was admitted to the hospital with a working diagnosis of acute on chronic hypoxic respiratory failure.  49 year old female who presented with dyspnea. She does have significant past medical history for NSIP,(nonspecific interstitial pneumonitis),history of DVT,type 2 diabetes mellitus, pulmonary hypertension and diastolic heart failure (RV failure). She does have chronic hypoxic respiratory failure, on home oxygen 12 to 15 L/min per nasal cannula. Over the last few days prior to hospitalization she has noticed worsening dyspnea and increased oxygen requirements, no fevers and no chills. On her initial physical examination blood pressure 109/92, pulse rate 106, respiratory rate 36, oxygen saturation 93%. She had moist mucous membranes, her lungs were clear to auscultation bilaterally, poor air movement, heart S1 and S2 present, rhythmic, abdomen soft, positive lower extremity edema. Sodium 140, potassium 2.5, chloride 96, bicarb 31, glucose 190, BUN 7, creatinine 0.79, white count 11.4, hemoglobin 12.1, hematocrit 41.8, platelets 307.SARS COVID-19 negative.Chest radiograph with bilateral interstitial infiltrates. EKG 106 bpm, right axis deviation, normal intervals, sinus rhythm with PVCs and PACs, no significant ST segment or T wave changes.  Old  records personally reviewed, last hospitalization on 04/23/19 to 04/28/19, patient at home on cellcept, oral steroids and bactrim prophylaxis. Her DLCO is 19% pred per last PFT.  Patient has been placed on diuresis, systemic steroids and high flow nasal cannula with slow improvement in her symptoms.  1.  Acute on chronic hypoxic respiratory failure, ILD, acute on chronic right heart failure (diastolic), cor pulmonale, severe pulmonary hypertension.  The patient was admitted to the medical ward, she was treated with furosemide for diuresis, and negative fluid balance was achieved with improvement of her symptoms.  Patient also was placed on systemic corticosteroids, bronchodilators and airway clearing techniques with flutter valve and incentive spirometry.   Her worsening dyspnea seems to be a combination of her severe right heart failure (echocardiography with decreased RV systolic function, flattened interventricular septum in systole and estimated RV pressure systolic 51.1 mmHg) combined with severe interstitial lung disease (DLCO 19% predicted per last PFT).   Patient will continue taking mycophenolate, prednisone, sildenafil, verapamil, Tyvaso, and her dose of furosemide has been increased.  Continue supplemental oxygen, follow-up with outpatient heart failure clinic and pulmonary clinic.  Prophylactic Bactrim.  Certainly she does have a poor prognosis due to her advanced cardio pulmonary disease, we talked about her condition and prognosis, she is hopeful to get a lung transplant but she is aware of the specific criteria for the procedure.  She also follows with palliative care nurse as an outpatient.   2.  Hypertension.  Continue blood pressure control with verapamil and atenolol.  3.  History of deep vein thrombosis.  Continue anticoagulation with apixaban.  4.  Hypokalemia-hypomagnesemia, chronic metabolic alkalosis.  Patient had her electrolytes corrected, at discharge potassium 3,7,  bicarb 34, BUN 30, creatinine 0.88,  Mg 2,0.  Continue potassium supplements.  Furosemide has  been changed to 60 mg twice daily.  Follow-up kidney function as an outpatient.  5.  Uncontrolled type 2 diabetes mellitus, hemoglobin A1c was 8.8.  Patient was placed on insulin sliding scale during her hospitalization along with basal insulin 15 units and premeal coverage 5 units.  Her discharge fasting glucose is 262, patient will continue taper steroids as an outpatient.  6.  Obesity class II.  Calculated BMI 35.1.  Patient will follow-up as an outpatient.  Discharge Diagnoses:  Principal Problem:   Acute on chronic respiratory failure with hypoxia (HCC) Active Problems:   Essential hypertension   Acute on chronic diastolic CHF (congestive heart failure) (HCC)   Pulmonary HTN (HCC)   Interstitial lung disease (HCC)   Hypokalemia   History of DVT (deep vein thrombosis)   Hypomagnesemia   Class 2 obesity due to excess calories in adult    Discharge Instructions   Allergies as of 06/04/2019   No Known Allergies     Medication List    TAKE these medications   acetaminophen 500 MG tablet Commonly known as: TYLENOL Take 2,000 mg by mouth as needed for moderate pain.   albuterol 108 (90 Base) MCG/ACT inhaler Commonly known as: VENTOLIN HFA Inhale 1 puff into the lungs every 6 (six) hours as needed for wheezing or shortness of breath.   apixaban 5 MG Tabs tablet Commonly known as: Eliquis Take 1 tablet (5 mg total) by mouth 2 (two) times daily.   atenolol 50 MG tablet Commonly known as: TENORMIN Take 75 mg by mouth 2 (two) times daily.   cholecalciferol 25 MCG (1000 UNIT) tablet Commonly known as: VITAMIN D3 Take 1,000 Units by mouth daily.   furosemide 20 MG tablet Commonly known as: LASIX Take 3 tablets (60 mg total) by mouth 2 (two) times daily.   gabapentin 300 MG capsule Commonly known as: NEURONTIN Take 1 capsule (300 mg total) by mouth 3 (three) times daily for 7  days.   HumaLOG KwikPen 100 UNIT/ML KwikPen Generic drug: insulin lispro Inject 0-20 Units into the skin 2 (two) times daily. Sliding scale based on sugar level   ipratropium-albuterol 0.5-2.5 (3) MG/3ML Soln Commonly known as: DUONEB Inhale 3 mLs into the lungs every 6 (six) hours as needed (sob and wheezing).   metFORMIN 500 MG 24 hr tablet Commonly known as: GLUCOPHAGE-XR Take 1,000 mg by mouth at bedtime.   Mucinex 600 MG 12 hr tablet Generic drug: guaiFENesin Take 600 mg by mouth daily.   mycophenolate 500 MG tablet Commonly known as: CELLCEPT Take 1,500 mg by mouth 2 (two) times daily.   naproxen sodium 220 MG tablet Commonly known as: ALEVE Take 440 mg by mouth as needed (pain).   potassium chloride SA 20 MEQ tablet Commonly known as: KLOR-CON Take 2 tablets (40 mEq total) by mouth daily.   predniSONE 10 MG tablet Commonly known as: DELTASONE Take 40 mg by mouth daily with breakfast.   sildenafil 20 MG tablet Commonly known as: REVATIO Take 20 mg by mouth 2 (two) times daily.   sulfamethoxazole-trimethoprim 800-160 MG tablet Commonly known as: BACTRIM DS Take 1 tablet by mouth 3 (three) times a week.   Tyler Aas FlexTouch 100 UNIT/ML FlexTouch Pen Generic drug: insulin degludec Inject 24 Units into the skin daily.   verapamil 120 MG CR tablet Commonly known as: CALAN-SR Take 1 tablet (120 mg total) by mouth at bedtime.       No Known Allergies  Consultations:  Cardiology heart failure team.  Procedures/Studies: DG Chest Port 1 View  Result Date: 05/30/2019 CLINICAL DATA:  Shortness of breath EXAM: PORTABLE CHEST 1 VIEW COMPARISON:  04/23/2019 FINDINGS: Moderate diffuse interstitial opacity, unchanged. This is consistent with known interstitial lung disease. No pleural effusion or pneumothorax. No focal airspace consolidation. IMPRESSION: Unchanged appearance of interstitial lung disease. Electronically Signed   By: Ulyses Jarred M.D.   On:  05/30/2019 19:40       Subjective: Patient is feeling back to her baseline, continue to use high flow supplemental 02, no chest pain, no nausea or vomiting.   Discharge Exam: Vitals:   06/04/19 0445 06/04/19 0447  BP:  114/68  Pulse:  94  Resp:  20  Temp:  98.5 F (36.9 C)  SpO2: 100% 99%   Vitals:   06/03/19 1950 06/03/19 2109 06/04/19 0445 06/04/19 0447  BP:  114/70  114/68  Pulse:  95  94  Resp:    20  Temp:    98.5 F (36.9 C)  TempSrc:    Axillary  SpO2: 99%  100% 99%  Weight:    95.7 kg  Height:        General: Not in pain or dyspnea.  Neurology: Awake and alert, non focal  E ENT: no pallor, no icterus, oral mucosa moist Cardiovascular:  S1-S2 present, rhythmic, no gallops, rubs, or murmurs. No lower extremity edema. Pulmonary: positive breath sounds bilaterally, decreased air movement, no wheezing, rhonchi or rales. Gastrointestinal. Abdomen with, no organomegaly, non tender, no rebound or guarding Skin. No rashes Musculoskeletal: no joint deformities   The results of significant diagnostics from this hospitalization (including imaging, microbiology, ancillary and laboratory) are listed below for reference.     Microbiology: Recent Results (from the past 240 hour(s))  Respiratory Panel by RT PCR (Flu A&B, Covid) - Nasopharyngeal Swab     Status: None   Collection Time: 05/30/19 10:10 PM   Specimen: Nasopharyngeal Swab  Result Value Ref Range Status   SARS Coronavirus 2 by RT PCR NEGATIVE NEGATIVE Final    Comment: (NOTE) SARS-CoV-2 target nucleic acids are NOT DETECTED. The SARS-CoV-2 RNA is generally detectable in upper respiratoy specimens during the acute phase of infection. The lowest concentration of SARS-CoV-2 viral copies this assay can detect is 131 copies/mL. A negative result does not preclude SARS-Cov-2 infection and should not be used as the sole basis for treatment or other patient management decisions. A negative result may occur with   improper specimen collection/handling, submission of specimen other than nasopharyngeal swab, presence of viral mutation(s) within the areas targeted by this assay, and inadequate number of viral copies (<131 copies/mL). A negative result must be combined with clinical observations, patient history, and epidemiological information. The expected result is Negative. Fact Sheet for Patients:  PinkCheek.be Fact Sheet for Healthcare Providers:  GravelBags.it This test is not yet ap proved or cleared by the [place] FDA and  has been authorized for detection and/or diagnosis of SARS-CoV-2 by FDA under an Emergency Use Authorization (EUA). This EUA will remain  in effect (meaning this test can be used) for the duration of the COVID-19 declaration under Section 564(b)(1) of the Act, 21 U.S.C. section 360bbb-3(b)(1), unless the authorization is terminated or revoked sooner.    Influenza A by PCR NEGATIVE NEGATIVE Final   Influenza B by PCR NEGATIVE NEGATIVE Final    Comment: (NOTE) The Xpert Xpress SARS-CoV-2/FLU/RSV assay is intended as an aid in  the diagnosis of influenza from Nasopharyngeal swab specimens and  should  not be used as a sole basis for treatment. Nasal washings and  aspirates are unacceptable for Xpert Xpress SARS-CoV-2/FLU/RSV  testing. Fact Sheet for Patients: PinkCheek.be Fact Sheet for Healthcare Providers: GravelBags.it This test is not yet approved or cleared by the [place] FDA and  has been authorized for detection and/or diagnosis of SARS-CoV-2 by  FDA under an Emergency Use Authorization (EUA). This EUA will remain  in effect (meaning this test can be used) for the duration of the  Covid-19 declaration under Section 564(b)(1) of the Act, 21  U.S.C. section 360bbb-3(b)(1), unless the authorization is  terminated or revoked. Performed at  [facility], [place] [address], [place], [place] [address]      Labs: BNP (last 3 results) Recent Labs    02/22/19 2321 04/24/19 0530 05/31/19 0025  BNP 612.0* 1,330.3* 1,740.8*   Basic Metabolic Panel: Recent Labs  Lab 05/30/19 1937 05/31/19 0025 05/31/19 0400 06/01/19 0349 06/02/19 0515 06/03/19 0255 06/04/19 0349  NA   < >  --  144 140 136 137 136  K   < >  --  3.8 4.4 4.2 4.0 3.7  CL   < >  --  100 96* 96* 93* 90*  CO2   < >  --  29 32 31 35* 34*  GLUCOSE   < >  --  166* 263* 253* 238* 262*  BUN   < >  --  10 16 21* 27* 30*  CREATININE   < >  --  0.88 0.82 0.81 0.88 0.88  CALCIUM   < >  --  8.3* 8.3* 8.6* 8.9 8.5*  MG  --  1.3* 1.5* 2.0  --   --   --    < > = values in this interval not displayed.   Liver Function Tests: Recent Labs  Lab 05/30/19 1937  AST 23  ALT 28  ALKPHOS 42  BILITOT 0.7  PROT 5.6*  ALBUMIN 3.2*   No results for input(s): LIPASE, AMYLASE in the last 168 hours. No results for input(s): AMMONIA in the last 168 hours. CBC: Recent Labs  Lab 05/30/19 1937 05/31/19 0400  WBC 11.4* 9.0  NEUTROABS 10.6* 7.6  HGB 12.1 12.1  HCT 41.8 41.3  MCV 91.1 89.8  PLT 307 332   Cardiac Enzymes: No results for input(s): CKTOTAL, CKMB, CKMBINDEX, TROPONINI in the last 168 hours. BNP: Invalid input(s): POCBNP CBG: Recent Labs  Lab 06/03/19 0751 06/03/19 1116 06/03/19 1606 06/03/19 2058 06/04/19 0732  GLUCAP 173* 146* 230* 388* 194*   D-Dimer No results for input(s): DDIMER in the last 72 hours. Hgb A1c No results for input(s): HGBA1C in the last 72 hours. Lipid Profile No results for input(s): CHOL, HDL, LDLCALC, TRIG, CHOLHDL, LDLDIRECT in the last 72 hours. Thyroid function studies No results for input(s): TSH, T4TOTAL, T3FREE, THYROIDAB in the last 72 hours.  Invalid input(s): FREET3 Anemia work up No results for input(s): VITAMINB12, FOLATE, FERRITIN, TIBC, IRON, RETICCTPCT in the last 72 hours. Urinalysis    Component  Value Date/Time   COLORURINE YELLOW 12/24/2008 1604   APPEARANCEUR CLOUDY (A) 12/24/2008 1604   LABSPEC 1.019 12/24/2008 1604   PHURINE 7.5 12/24/2008 1604   GLUCOSEU NEGATIVE 12/24/2008 1604   HGBUR NEGATIVE 12/24/2008 1604   BILIRUBINUR NEGATIVE 12/24/2008 [place] 12/24/2008 1604   PROTEINUR NEGATIVE 12/24/2008 1604   UROBILINOGEN 0.2 12/24/2008 1604   NITRITE NEGATIVE 12/24/2008 1604   LEUKOCYTESUR MODERATE (A) 12/24/2008 1604  Sepsis Labs Invalid input(s): PROCALCITONIN,  WBC,  LACTICIDVEN Microbiology Recent Results (from the past 240 hour(s))  Respiratory Panel by RT PCR (Flu A&B, Covid) - Nasopharyngeal Swab     Status: None   Collection Time: 05/30/19 10:10 PM   Specimen: Nasopharyngeal Swab  Result Value Ref Range Status   SARS Coronavirus 2 by RT PCR NEGATIVE NEGATIVE Final    Comment: (NOTE) SARS-CoV-2 target nucleic acids are NOT DETECTED. The SARS-CoV-2 RNA is generally detectable in upper respiratoy specimens during the acute phase of infection. The lowest concentration of SARS-CoV-2 viral copies this assay can detect is 131 copies/mL. A negative result does not preclude SARS-Cov-2 infection and should not be used as the sole basis for treatment or other patient management decisions. A negative result may occur with  improper specimen collection/handling, submission of specimen other than nasopharyngeal swab, presence of viral mutation(s) within the areas targeted by this assay, and inadequate number of viral copies (<131 copies/mL). A negative result must be combined with clinical observations, patient history, and epidemiological information. The expected result is Negative. Fact Sheet for Patients:  PinkCheek.be Fact Sheet for Healthcare Providers:  GravelBags.it This test is not yet ap proved or cleared by the [place] FDA and  has been authorized for detection and/or diagnosis  of SARS-CoV-2 by FDA under an Emergency Use Authorization (EUA). This EUA will remain  in effect (meaning this test can be used) for the duration of the COVID-19 declaration under Section 564(b)(1) of the Act, 21 U.S.C. section 360bbb-3(b)(1), unless the authorization is terminated or revoked sooner.    Influenza A by PCR NEGATIVE NEGATIVE Final   Influenza B by PCR NEGATIVE NEGATIVE Final    Comment: (NOTE) The Xpert Xpress SARS-CoV-2/FLU/RSV assay is intended as an aid in  the diagnosis of influenza from Nasopharyngeal swab specimens and  should not be used as a sole basis for treatment. Nasal washings and  aspirates are unacceptable for Xpert Xpress SARS-CoV-2/FLU/RSV  testing. Fact Sheet for Patients: PinkCheek.be Fact Sheet for Healthcare Providers: GravelBags.it This test is not yet approved or cleared by the [place] FDA and  has been authorized for detection and/or diagnosis of SARS-CoV-2 by  FDA under an Emergency Use Authorization (EUA). This EUA will remain  in effect (meaning this test can be used) for the duration of the  Covid-19 declaration under Section 564(b)(1) of the Act, 21  U.S.C. section 360bbb-3(b)(1), unless the authorization is  terminated or revoked. Performed at [facility], [place] [address] [place] [address], [place], [place] [address]      Time coordinating discharge: 45 minutes  SIGNED:   Tawni Millers, MD  [facility] 06/04/2019, 8:34 AM

## 2019-06-04 NOTE — Progress Notes (Signed)
PT Cancellation Note  Patient Details Name: Felicia [name] MRN: [id number] DOB: Sep 30, 1970   Cancelled Treatment:    Reason Eval/Treat Not Completed: Other (comment)  Noted plan for d/c home today.  Pt on 15 L HFNC (on 13-15 HFNC at home).  Upon arrival pt was on computer and reports she has a work call in a few minutes.  She reports she has to get a bath and has another call later before she leaves at 2.  She was very pleasant and reports she has been ambulating in room with nursing and gets therapy at home.  Pt reports she has been doing incentive spirometer but also asked about further exercises to improve breathing.  Briefly discussed diaphragmatic breathing, posture, and scapular retraction to open chest.  Will f/u as able. Maggie Font, PT Acute Rehab Services Pager [phone] [name] [phone]    Karlton Lemon 06/04/2019, 11:39 AM

## 2019-06-04 NOTE — Progress Notes (Signed)
Occupational Therapy Treatment Patient Details Name: Felicia [name] MRN: [id number] DOB: Sep 01, 1970 Today's Date: 06/04/2019    History of present illness Pt is a 49 y/o female who presented with dyspnea, admitted for acute on chornic hypoxic respiratory failure. PMH significant for but not limited to: nonspecific interstitial pneumonitis, DVT, DM 2, plumonary HTN and diastolic heart failure; chronic hypoxic respiratory failure on 12-15L/min [place].    OT comments  Patient seated EOB and agreeable to OT session.  Reviewed energy conservation techniques, UE exercises (and provided yellow theraband), progression with activity tolerance and  PLB/positoning during activities. Pt highly motivated to increase activity tolerance and strength, but declines engagement in tasks today due to leaving shortly (needing to save her energy).  Completed LB dressing with modified independence, noted increased SOB with minimal activity and cueing for PLB throughout LB dressing. On 15 L HFNC throughout session.  OT will sign off, no further acute needs but continue to recommend OT at next venue of care ([place]).    Follow Up Recommendations  Home health OT;Supervision/Assistance - 24 hour    Equipment Recommendations  None recommended by OT    Recommendations for Other Services      Precautions / Restrictions Precautions Precautions: Fall Precaution Comments: watch O2, on 15L HFNC  Restrictions Weight Bearing Restrictions: No       Mobility Bed Mobility               General bed mobility comments: EOB upon entry   Transfers Overall transfer level: Modified independent               General transfer comment: using RW     Balance Overall balance assessment: Mild deficits observed, not formally tested                                         ADL either performed or assessed with clinical judgement   ADL Overall ADL's : Needs assistance/impaired                      Lower Body Dressing: Modified independent;Sit to/from stand               Functional mobility during ADLs: Modified independent;Rolling walker General ADL Comments: pt limited by decreased activity toelrance and generalized weakness      Vision   Vision Assessment?: No apparent visual deficits   Perception     Praxis      Cognition Arousal/Alertness: Awake/alert Behavior During Therapy: WFL for tasks assessed/performed Overall Cognitive Status: Within Functional Limits for tasks assessed                                          Exercises     Shoulder Instructions       General Comments reviewed energy conservation techniques, UE exercises (and provided yellow theraband), progression with activity tolerance and  PLB/positoning during activities.  Pt verbalizes understanding with all education, declined engagement in standing tolerance tasks or band exercises due to leaving today.     Pertinent Vitals/ Pain       Pain Assessment: No/denies pain  Home Living  Prior Functioning/Environment              Frequency  Min 2X/week        Progress Toward Goals  OT Goals(current goals can now be found in the care plan section)  Progress towards OT goals: Goals met/education completed, patient discharged from OT(OT follow up at next level of care ( [place]))  Acute Rehab OT Goals Patient Stated Goal: to get stronger  OT Goal Formulation: With patient  Plan All goals met and education completed, patient discharged from OT services    Co-evaluation                 AM-PAC OT "6 Clicks" Daily Activity     Outcome Measure   Help from another person eating meals?: None Help from another person taking care of personal grooming?: None Help from another person toileting, which includes using toliet, bedpan, or urinal?: A Little Help from another person bathing (including washing,  rinsing, drying)?: A Little Help from another person to put on and taking off regular upper body clothing?: None Help from another person to put on and taking off regular lower body clothing?: None 6 Click Score: 22    End of Session    OT Visit Diagnosis: Other abnormalities of gait and mobility (R26.89);Muscle weakness (generalized) (M62.81)   Activity Tolerance Patient tolerated treatment well   Patient Left with call bell/phone within reach;Other (comment)(seated EOB )   Nurse Communication Mobility status        Time: 4327-6147 OT Time Calculation (min): 30 min  Charges: OT General Charges $OT Visit: 1 Visit OT Treatments $Self Care/Home Management : 23-37 mins  [place] Pager [phone] Office [phone]    Delight Stare 06/04/2019, 2:22 PM

## 2019-06-06 ENCOUNTER — Telehealth: Payer: Self-pay | Admitting: Pulmonary Disease

## 2019-06-06 NOTE — Telephone Encounter (Signed)
Dr. Jenetta Downer, please advise if you are okay with [place] providing the verbal orders as requested.

## 2019-06-06 NOTE — Telephone Encounter (Signed)
Called [employer]. Received notification regarding a prior authorization for Truxima. Authorization has been APPROVED from 05/16/19 to 05/15/20.   Authorization # [id number] Phone # [phone]  Letter was mailed to office, put in a request to have approval letter faxed.  9:49 AM Beatriz Chancellor, CPhT

## 2019-06-07 NOTE — Telephone Encounter (Signed)
Called and spoke with [name] letting her know that AO was fine with [place] providing a verbal for the PT orders. She verbalized understanding. Nothing further needed.

## 2019-06-07 NOTE — Telephone Encounter (Signed)
Yes  I am okay with it

## 2019-06-10 ENCOUNTER — Telehealth: Payer: Self-pay | Admitting: Pulmonary Disease

## 2019-06-10 NOTE — Telephone Encounter (Signed)
Dr. Jenetta Downer please advise if you are ok with patient having a Copywriter, advertising.  Per Cecelia this is to evaluate pt for palliative care.  Thanks!

## 2019-06-10 NOTE — Telephone Encounter (Signed)
Yes. appropriate

## 2019-06-10 NOTE — Telephone Encounter (Signed)
Called spoke with Cecelia w/ [facility], advised Dr Skip Estimable for the social worker evaluation.  Per Cecelia, this verbal order is sufficient - no need to place anything in epic.  Nothing further needed at this time; will sign off.

## 2019-06-17 ENCOUNTER — Telehealth: Payer: Self-pay | Admitting: Pulmonary Disease

## 2019-06-17 NOTE — Telephone Encounter (Signed)
Dr. Eugenia Pancoast at [facility] needs a verbal consent for palliative care, please advise

## 2019-06-17 NOTE — Telephone Encounter (Signed)
Palliative care is appropriate

## 2019-06-17 NOTE — Telephone Encounter (Signed)
Spoke with Jenny Reichmann at [facility] and gave verbal order for palliative care per Dr Gala Murdoch. Nothing further needed at this time.

## 2019-06-18 ENCOUNTER — Telehealth: Payer: Self-pay | Admitting: Internal Medicine

## 2019-06-18 NOTE — Telephone Encounter (Signed)
Phone call placed to patient to offer to schedule a visit with [facility]. Phone rang, with no answer I left a voicemail for call back.

## 2019-06-19 ENCOUNTER — Telehealth: Payer: Self-pay | Admitting: Pulmonary Disease

## 2019-06-19 NOTE — Telephone Encounter (Signed)
Dr. Jenetta Downer, please advise if you are fine with [place] providing the verbal PT orders.

## 2019-06-21 NOTE — Telephone Encounter (Signed)
Felicia [name] calling to check on the status of the verbal order for pt for the pt. [name] can be reached at [phone].

## 2019-06-21 NOTE — Telephone Encounter (Signed)
Waiting on response from Dr. Jenetta Downer. Dr. Jenetta Downer, please advise if you are okay with the verbal orders.

## 2019-06-25 NOTE — Telephone Encounter (Signed)
Spoke with Ceceilia. She has been given the verbal orders for the pt's PT and skilled nursing. Nothing further was needed.

## 2019-06-25 NOTE — Telephone Encounter (Signed)
I am okay with the verbal order for PT needs

## 2019-06-25 NOTE — Telephone Encounter (Signed)
Dr.Olelere can you please advise. Thank you   Patient stated below   Dr. [name] and team, [name] My physical therapy company-[facility] has not been able to get my order for Physical Therapy recertified. Would your team be able to assist with recertifying the order?   Thanks in advance, Felicia [name]

## 2019-06-27 ENCOUNTER — Telehealth: Payer: Self-pay | Admitting: Internal Medicine

## 2019-06-27 NOTE — Telephone Encounter (Signed)
Rec'd call back from patient and after discussing Palliative services she was in agreement with this.  I have scheduled a [place] for 07/05/19 @ 9:30 AM>

## 2019-06-27 NOTE — Telephone Encounter (Signed)
Called patient's cell to schedule the Palliative Consult, no answer and unable to leave message due to no VM set up.  I then called patient's work number and left a message with reason for call along with my name and contact number.

## 2019-07-04 NOTE — Telephone Encounter (Signed)
Called [employer] Clinical Review-Approval Letter was mailed to office, put in a request to have approval letter faxed again. Received notification regarding a prior authorization for [employer]. Authorization has been APPROVED from 05/16/19 to 05/15/20.   Authorization # [id number] Phone # [phone]  2:31 PM Aritha Huckeba Delice Lesch, CPhT

## 2019-07-04 NOTE — Telephone Encounter (Signed)
thanks

## 2019-07-05 ENCOUNTER — Other Ambulatory Visit: Payer: 59 | Admitting: Internal Medicine

## 2019-07-05 ENCOUNTER — Other Ambulatory Visit: Payer: Self-pay

## 2019-07-05 DIAGNOSIS — J849 Interstitial pulmonary disease, unspecified: Secondary | ICD-10-CM

## 2019-07-05 DIAGNOSIS — Z515 Encounter for palliative care: Secondary | ICD-10-CM

## 2019-07-24 NOTE — Telephone Encounter (Signed)
PT services extended 07/22/2019 for 2w3 and 1w1. Faxed to [place].

## 2019-07-30 ENCOUNTER — Telehealth: Payer: Self-pay | Admitting: Physician Assistant

## 2019-07-30 NOTE — Telephone Encounter (Signed)
I connected by phone with Allen Norris and/or patient's caregiver on 07/30/2019 at 5:42 PM to discuss the potential vaccination through our Homebound vaccination initiative.   Prevaccination Checklist for COVID-19 Vaccines  1.  Are you feeling sick today? no  2.  Have you ever received a dose of a COVID-19 vaccine?  yes      If yes, which one? Moderna   3.  Have you ever had an allergic reaction: (This would include a severe reaction [ e.g., anaphylaxis] that required treatment with epinephrine or EpiPen or that caused you to go to the hospital.  It would also include an allergic reaction that occurred within 4 hours that caused hives, swelling, or respiratory distress, including wheezing.) A.  A previous dose of COVID-19 vaccine. no  B.  A vaccine or injectable therapy that contains multiple components, one of which is a COVID-19 vaccine component, but it is not known which component elicited the immediate reaction. no  C.  Are you allergic to polyethylene glycol? no  D. Are you allergic to Polysorbate, which is found in some vaccines, film coated tablets and intravenous steroids?  no   4.  Have you ever had an allergic reaction to another vaccine (other than COVID-19 vaccine) or an injectable medication? (This would include a severe reaction [ e.g., anaphylaxis] that required treatment with epinephrine or EpiPen or that caused you to go to the hospital.  It would also include an allergic reaction that occurred within 4 hours that caused hives, swelling, or respiratory distress, including wheezing.)  no   5.  Have you ever had a severe allergic reaction (e.g., anaphylaxis) to something other than a component of the COVID-19 vaccine, or any vaccine or injectable medication?  This would include food, pet, venom, environmental, or oral medication allergies.  no   6.  Have you received any vaccine in the last 14 days? no   7.  Have you ever had a positive test for COVID-19 or has a doctor ever  told you that you had COVID-19?  no   8.  Have you received passive antibody therapy (monoclonal antibodies or convalescent serum) as a treatment for COVID-19? no   9.  Do you have a weakened immune system caused by something such as HIV infection or cancer or do you take immunosuppressive drugs or therapies?  yes, on Cellcept for autoimmune lung disease  10.  Do you have a bleeding disorder or are you taking a blood thinner? yes, Eliquis   11.  Are you pregnant or breast-feeding? no   12.  Do you have dermal fillers? no   __________________   This patient is a 49 y.o. female that meets the FDA criteria to receive homebound vaccination. Patient or parent/caregiver understands they have the option to accept or refuse homebound vaccination.  Patient passed the pre-screening checklist and would like to proceed with homebound vaccination.  Based on questionnaire above, I recommend the patient be observed for 30 minutes.  There are no other household members/caregivers who are also interested in receiving the vaccine.    The pt had her first vaccine Levan Hurst) at a [facility] sometime in march. Has been in and out of hospital and could not get second. Interested in [place] vaccination. I will send the patient's information to our scheduling team who will reach out to schedule the patient and potential caregiver/family members for homebound vaccination.   Angelena Form 07/30/2019 5:42 PM

## 2019-08-01 ENCOUNTER — Telehealth: Payer: Self-pay | Admitting: Pulmonary Disease

## 2019-08-01 ENCOUNTER — Other Ambulatory Visit: Payer: Self-pay | Admitting: Pulmonary Disease

## 2019-08-01 NOTE — Telephone Encounter (Signed)
Attempted to call Shauna but there was no answer. LM for her to call back.

## 2019-08-02 ENCOUNTER — Telehealth: Payer: Self-pay | Admitting: Pulmonary Disease

## 2019-08-02 ENCOUNTER — Other Ambulatory Visit: Payer: Self-pay | Admitting: Pulmonary Disease

## 2019-08-02 MED ORDER — APIXABAN 5 MG PO TABS: 5.0000 mg | ORAL_TABLET | Freq: Two times a day (BID) | ORAL | 5 refills | Status: AC

## 2019-08-02 NOTE — Telephone Encounter (Signed)
Spoke with Felicia [name] with [facility]. Pt is wanting to enroll for mail order pharmacy services with them. She wanted to make sure that Dr. [name] was okay with her doing that. Advised Felicia [name] that we support whatever is the most convenient for the pt. Nothing further was needed.

## 2019-08-02 NOTE — Telephone Encounter (Signed)
Patient is requesting refill on Eliquis 5 mg BID. She has a complex history. Can we refill this medication? Please advise.

## 2019-08-02 NOTE — Telephone Encounter (Signed)
Refill for Eliquis sent to preferred pharmacy.

## 2019-08-02 NOTE — Telephone Encounter (Signed)
Yes we can

## 2019-08-08 ENCOUNTER — Ambulatory Visit: Payer: 59 | Attending: Critical Care Medicine

## 2019-08-08 DIAGNOSIS — Z23 Encounter for immunization: Secondary | ICD-10-CM

## 2019-08-08 NOTE — Progress Notes (Signed)
   Covid-19 Vaccination Clinic  Name:  Sheriann Newmann    MRN: [id number] DOB: [birth year]  08/08/2019  Ms. Bayman was observed post Covid-19 immunization for 15 min without incident. She was provided with Vaccine Information Sheet and instruction to access the V-Safe system.   Ms. Ende was instructed to call 911 with any severe reactions post vaccine: [name] Difficulty breathing  . Swelling of face and throat  . A fast heartbeat  . A bad rash all over body  . Dizziness and weakness   Immunizations Administered    Name Date Dose VIS Date Route   Moderna COVID-19 Vaccine 08/08/2019 12:07 PM 0.5 mL 01/2019 Intramuscular   Manufacturer: Moderna   Lot: [id number]   [place]: [id number]

## 2019-08-21 ENCOUNTER — Telehealth: Payer: Self-pay | Admitting: Pulmonary Disease

## 2019-08-21 NOTE — Telephone Encounter (Signed)
I called Felicia [name] but there was no answer. I left message to call us back.

## 2019-08-22 ENCOUNTER — Telehealth: Payer: Self-pay | Admitting: Pulmonary Disease

## 2019-08-22 NOTE — Telephone Encounter (Signed)
She needs to refax this bc I checked and do not see this  LMTCB

## 2019-08-22 NOTE — Telephone Encounter (Signed)
Spoke with Janine from [employer]. Advised her that we have never prescribed this medication for the pt. Sherlyn Hay will reach out to the pt's PCP. Nothing further was needed.

## 2019-08-23 NOTE — Telephone Encounter (Signed)
Cecelia returning missed call. Wants to know if they can give a verbal order since we cannot find the fax. Can be reached at  [phone]. Cecelia asked that if she doesn't answer to leave verbal order on answering machine if possible. Please advise.

## 2019-08-23 NOTE — Telephone Encounter (Signed)
Spoke with [name] with [employer]  She is requesting VO for PT for strength 1 x per wk for 4 wks  Please advise if okay with you thanks

## 2019-08-26 NOTE — Telephone Encounter (Signed)
Okay to order physical therapy for strength, 1 time a week for 4 weeks

## 2019-08-26 NOTE — Telephone Encounter (Signed)
Spoke with Felicia [name] and gave VO for PT

## 2019-09-03 ENCOUNTER — Inpatient Hospital Stay (HOSPITAL_COMMUNITY)
Admission: EM | Admit: 2019-09-03 | Discharge: 2019-10-09 | DRG: 291 | Disposition: E | Payer: 59 | Attending: Critical Care Medicine | Admitting: Critical Care Medicine

## 2019-09-03 ENCOUNTER — Emergency Department (HOSPITAL_COMMUNITY): Payer: 59

## 2019-09-03 DIAGNOSIS — J9622 Acute and chronic respiratory failure with hypercapnia: Secondary | ICD-10-CM | POA: Diagnosis present

## 2019-09-03 DIAGNOSIS — K5900 Constipation, unspecified: Secondary | ICD-10-CM | POA: Diagnosis not present

## 2019-09-03 DIAGNOSIS — F419 Anxiety disorder, unspecified: Secondary | ICD-10-CM | POA: Diagnosis present

## 2019-09-03 DIAGNOSIS — J329 Chronic sinusitis, unspecified: Secondary | ICD-10-CM | POA: Diagnosis present

## 2019-09-03 DIAGNOSIS — Z7189 Other specified counseling: Secondary | ICD-10-CM | POA: Diagnosis not present

## 2019-09-03 DIAGNOSIS — R0902 Hypoxemia: Secondary | ICD-10-CM

## 2019-09-03 DIAGNOSIS — J9621 Acute and chronic respiratory failure with hypoxia: Secondary | ICD-10-CM | POA: Diagnosis present

## 2019-09-03 DIAGNOSIS — G92 Toxic encephalopathy: Secondary | ICD-10-CM | POA: Diagnosis not present

## 2019-09-03 DIAGNOSIS — I361 Nonrheumatic tricuspid (valve) insufficiency: Secondary | ICD-10-CM | POA: Diagnosis not present

## 2019-09-03 DIAGNOSIS — Z6832 Body mass index (BMI) 32.0-32.9, adult: Secondary | ICD-10-CM

## 2019-09-03 DIAGNOSIS — J849 Interstitial pulmonary disease, unspecified: Secondary | ICD-10-CM | POA: Diagnosis not present

## 2019-09-03 DIAGNOSIS — Z86718 Personal history of other venous thrombosis and embolism: Secondary | ICD-10-CM

## 2019-09-03 DIAGNOSIS — L89322 Pressure ulcer of left buttock, stage 2: Secondary | ICD-10-CM | POA: Diagnosis present

## 2019-09-03 DIAGNOSIS — E872 Acidosis: Secondary | ICD-10-CM | POA: Diagnosis present

## 2019-09-03 DIAGNOSIS — Z23 Encounter for immunization: Secondary | ICD-10-CM | POA: Diagnosis not present

## 2019-09-03 DIAGNOSIS — Z888 Allergy status to other drugs, medicaments and biological substances status: Secondary | ICD-10-CM

## 2019-09-03 DIAGNOSIS — R569 Unspecified convulsions: Secondary | ICD-10-CM | POA: Diagnosis not present

## 2019-09-03 DIAGNOSIS — R0609 Other forms of dyspnea: Secondary | ICD-10-CM | POA: Diagnosis not present

## 2019-09-03 DIAGNOSIS — I959 Hypotension, unspecified: Secondary | ICD-10-CM | POA: Diagnosis not present

## 2019-09-03 DIAGNOSIS — R778 Other specified abnormalities of plasma proteins: Secondary | ICD-10-CM | POA: Diagnosis not present

## 2019-09-03 DIAGNOSIS — I471 Supraventricular tachycardia: Secondary | ICD-10-CM | POA: Diagnosis not present

## 2019-09-03 DIAGNOSIS — J8489 Other specified interstitial pulmonary diseases: Secondary | ICD-10-CM | POA: Diagnosis present

## 2019-09-03 DIAGNOSIS — E11649 Type 2 diabetes mellitus with hypoglycemia without coma: Secondary | ICD-10-CM | POA: Diagnosis not present

## 2019-09-03 DIAGNOSIS — H0520 Unspecified exophthalmos: Secondary | ICD-10-CM | POA: Diagnosis present

## 2019-09-03 DIAGNOSIS — R109 Unspecified abdominal pain: Secondary | ICD-10-CM

## 2019-09-03 DIAGNOSIS — Z833 Family history of diabetes mellitus: Secondary | ICD-10-CM

## 2019-09-03 DIAGNOSIS — L8990 Pressure ulcer of unspecified site, unspecified stage: Secondary | ICD-10-CM | POA: Insufficient documentation

## 2019-09-03 DIAGNOSIS — L89302 Pressure ulcer of unspecified buttock, stage 2: Secondary | ICD-10-CM | POA: Diagnosis not present

## 2019-09-03 DIAGNOSIS — R0600 Dyspnea, unspecified: Secondary | ICD-10-CM

## 2019-09-03 DIAGNOSIS — I469 Cardiac arrest, cause unspecified: Secondary | ICD-10-CM | POA: Diagnosis not present

## 2019-09-03 DIAGNOSIS — Z8249 Family history of ischemic heart disease and other diseases of the circulatory system: Secondary | ICD-10-CM

## 2019-09-03 DIAGNOSIS — K5909 Other constipation: Secondary | ICD-10-CM | POA: Diagnosis not present

## 2019-09-03 DIAGNOSIS — I110 Hypertensive heart disease with heart failure: Secondary | ICD-10-CM | POA: Diagnosis not present

## 2019-09-03 DIAGNOSIS — I2721 Secondary pulmonary arterial hypertension: Secondary | ICD-10-CM | POA: Diagnosis present

## 2019-09-03 DIAGNOSIS — I50812 Chronic right heart failure: Secondary | ICD-10-CM | POA: Diagnosis not present

## 2019-09-03 DIAGNOSIS — G9340 Encephalopathy, unspecified: Secondary | ICD-10-CM

## 2019-09-03 DIAGNOSIS — Z794 Long term (current) use of insulin: Secondary | ICD-10-CM

## 2019-09-03 DIAGNOSIS — J189 Pneumonia, unspecified organism: Secondary | ICD-10-CM | POA: Diagnosis not present

## 2019-09-03 DIAGNOSIS — R7989 Other specified abnormal findings of blood chemistry: Secondary | ICD-10-CM | POA: Diagnosis not present

## 2019-09-03 DIAGNOSIS — I4819 Other persistent atrial fibrillation: Secondary | ICD-10-CM | POA: Diagnosis not present

## 2019-09-03 DIAGNOSIS — R9431 Abnormal electrocardiogram [ECG] [EKG]: Secondary | ICD-10-CM | POA: Diagnosis not present

## 2019-09-03 DIAGNOSIS — E1165 Type 2 diabetes mellitus with hyperglycemia: Secondary | ICD-10-CM | POA: Diagnosis present

## 2019-09-03 DIAGNOSIS — E871 Hypo-osmolality and hyponatremia: Secondary | ICD-10-CM | POA: Diagnosis not present

## 2019-09-03 DIAGNOSIS — I480 Paroxysmal atrial fibrillation: Secondary | ICD-10-CM | POA: Diagnosis not present

## 2019-09-03 DIAGNOSIS — I5033 Acute on chronic diastolic (congestive) heart failure: Secondary | ICD-10-CM | POA: Diagnosis present

## 2019-09-03 DIAGNOSIS — I272 Pulmonary hypertension, unspecified: Secondary | ICD-10-CM | POA: Diagnosis not present

## 2019-09-03 DIAGNOSIS — N179 Acute kidney failure, unspecified: Secondary | ICD-10-CM | POA: Diagnosis not present

## 2019-09-03 DIAGNOSIS — Z515 Encounter for palliative care: Secondary | ICD-10-CM

## 2019-09-03 DIAGNOSIS — Z20822 Contact with and (suspected) exposure to covid-19: Secondary | ICD-10-CM | POA: Diagnosis present

## 2019-09-03 DIAGNOSIS — I2723 Pulmonary hypertension due to lung diseases and hypoxia: Secondary | ICD-10-CM | POA: Diagnosis present

## 2019-09-03 DIAGNOSIS — E669 Obesity, unspecified: Secondary | ICD-10-CM | POA: Diagnosis present

## 2019-09-03 DIAGNOSIS — K7200 Acute and subacute hepatic failure without coma: Secondary | ICD-10-CM | POA: Diagnosis not present

## 2019-09-03 DIAGNOSIS — D649 Anemia, unspecified: Secondary | ICD-10-CM | POA: Diagnosis not present

## 2019-09-03 DIAGNOSIS — I50813 Acute on chronic right heart failure: Secondary | ICD-10-CM | POA: Diagnosis not present

## 2019-09-03 DIAGNOSIS — B370 Candidal stomatitis: Secondary | ICD-10-CM | POA: Diagnosis not present

## 2019-09-03 DIAGNOSIS — K602 Anal fissure, unspecified: Secondary | ICD-10-CM | POA: Diagnosis not present

## 2019-09-03 DIAGNOSIS — Z809 Family history of malignant neoplasm, unspecified: Secondary | ICD-10-CM

## 2019-09-03 DIAGNOSIS — I2781 Cor pulmonale (chronic): Secondary | ICD-10-CM | POA: Diagnosis present

## 2019-09-03 DIAGNOSIS — E876 Hypokalemia: Secondary | ICD-10-CM | POA: Diagnosis present

## 2019-09-03 DIAGNOSIS — Z9981 Dependence on supplemental oxygen: Secondary | ICD-10-CM

## 2019-09-03 DIAGNOSIS — J9690 Respiratory failure, unspecified, unspecified whether with hypoxia or hypercapnia: Secondary | ICD-10-CM

## 2019-09-03 DIAGNOSIS — Z66 Do not resuscitate: Secondary | ICD-10-CM | POA: Diagnosis not present

## 2019-09-03 DIAGNOSIS — Z0189 Encounter for other specified special examinations: Secondary | ICD-10-CM

## 2019-09-03 DIAGNOSIS — Z452 Encounter for adjustment and management of vascular access device: Secondary | ICD-10-CM

## 2019-09-03 DIAGNOSIS — R069 Unspecified abnormalities of breathing: Secondary | ICD-10-CM

## 2019-09-03 DIAGNOSIS — I5082 Biventricular heart failure: Secondary | ICD-10-CM | POA: Diagnosis present

## 2019-09-03 DIAGNOSIS — T502X5A Adverse effect of carbonic-anhydrase inhibitors, benzothiadiazides and other diuretics, initial encounter: Secondary | ICD-10-CM | POA: Diagnosis not present

## 2019-09-03 DIAGNOSIS — Z7952 Long term (current) use of systemic steroids: Secondary | ICD-10-CM

## 2019-09-03 DIAGNOSIS — F329 Major depressive disorder, single episode, unspecified: Secondary | ICD-10-CM | POA: Diagnosis present

## 2019-09-03 DIAGNOSIS — G931 Anoxic brain damage, not elsewhere classified: Secondary | ICD-10-CM | POA: Diagnosis not present

## 2019-09-03 DIAGNOSIS — R0603 Acute respiratory distress: Secondary | ICD-10-CM | POA: Diagnosis present

## 2019-09-03 DIAGNOSIS — Z810 Family history of intellectual disabilities: Secondary | ICD-10-CM

## 2019-09-03 DIAGNOSIS — I270 Primary pulmonary hypertension: Secondary | ICD-10-CM | POA: Diagnosis not present

## 2019-09-03 DIAGNOSIS — I2609 Other pulmonary embolism with acute cor pulmonale: Secondary | ICD-10-CM | POA: Diagnosis not present

## 2019-09-03 DIAGNOSIS — Z79899 Other long term (current) drug therapy: Secondary | ICD-10-CM

## 2019-09-03 DIAGNOSIS — J80 Acute respiratory distress syndrome: Secondary | ICD-10-CM

## 2019-09-03 DIAGNOSIS — D84821 Immunodeficiency due to drugs: Secondary | ICD-10-CM | POA: Diagnosis present

## 2019-09-03 DIAGNOSIS — I4891 Unspecified atrial fibrillation: Secondary | ICD-10-CM

## 2019-09-03 DIAGNOSIS — I468 Cardiac arrest due to other underlying condition: Secondary | ICD-10-CM | POA: Diagnosis not present

## 2019-09-03 DIAGNOSIS — Z8701 Personal history of pneumonia (recurrent): Secondary | ICD-10-CM

## 2019-09-03 DIAGNOSIS — I071 Rheumatic tricuspid insufficiency: Secondary | ICD-10-CM | POA: Diagnosis present

## 2019-09-03 DIAGNOSIS — G253 Myoclonus: Secondary | ICD-10-CM | POA: Diagnosis not present

## 2019-09-03 DIAGNOSIS — Z7901 Long term (current) use of anticoagulants: Secondary | ICD-10-CM

## 2019-09-03 DIAGNOSIS — Z9911 Dependence on respirator [ventilator] status: Secondary | ICD-10-CM | POA: Diagnosis not present

## 2019-09-03 LAB — COMPREHENSIVE METABOLIC PANEL
ALT: 29 U/L (ref 0–44)
AST: 25 U/L (ref 15–41)
Albumin: 3.5 g/dL (ref 3.5–5.0)
Alkaline Phosphatase: 80 U/L (ref 38–126)
Anion gap: 21 — ABNORMAL HIGH (ref 5–15)
BUN: 28 mg/dL — ABNORMAL HIGH (ref 6–20)
CO2: 27 mmol/L (ref 22–32)
Calcium: 9.4 mg/dL (ref 8.9–10.3)
Chloride: 90 mmol/L — ABNORMAL LOW (ref 98–111)
Creatinine, Ser: 1.09 mg/dL — ABNORMAL HIGH (ref 0.44–1.00)
GFR calc Af Amer: >60 mL/min (ref >60)
GFR calc non Af Amer: 60 mL/min — ABNORMAL LOW (ref >60)
Glucose, Bld: 455 mg/dL — ABNORMAL HIGH (ref 70–99)
Potassium: 3.3 mmol/L — ABNORMAL LOW (ref 3.5–5.1)
Sodium: 138 mmol/L (ref 135–145)
Total Bilirubin: 1.6 mg/dL — ABNORMAL HIGH (ref 0.3–1.2)
Total Protein: 5.6 g/dL — ABNORMAL LOW (ref 6.5–8.1)

## 2019-09-03 LAB — CBC
HCT: 47.6 % — ABNORMAL HIGH (ref 36.0–46.0)
Hemoglobin: 13.8 g/dL (ref 12.0–15.0)
MCH: 25.1 pg — ABNORMAL LOW (ref 26.0–34.0)
MCHC: 29 g/dL — ABNORMAL LOW (ref 30.0–36.0)
MCV: 86.5 fL (ref 80.0–100.0)
Platelets: 259 10*3/uL (ref 150–400)
RBC: 5.5 MIL/uL — ABNORMAL HIGH (ref 3.87–5.11)
RDW: 16.5 % — ABNORMAL HIGH (ref 11.5–15.5)
WBC: 13.8 10*3/uL — ABNORMAL HIGH (ref 4.0–10.5)
nRBC: 1.4 % — ABNORMAL HIGH (ref 0.0–0.2)

## 2019-09-03 LAB — I-STAT ARTERIAL BLOOD GAS, ED
Acid-Base Excess: 10 mmol/L — ABNORMAL HIGH (ref 0.0–2.0)
Bicarbonate: 34.6 mmol/L — ABNORMAL HIGH (ref 20.0–28.0)
Calcium, Ion: 1.08 mmol/L — ABNORMAL LOW (ref 1.15–1.40)
HCT: 47 % — ABNORMAL HIGH (ref 36.0–46.0)
Hemoglobin: 16 g/dL — ABNORMAL HIGH (ref 12.0–15.0)
O2 Saturation: 98 %
Potassium: 3.1 mmol/L — ABNORMAL LOW (ref 3.5–5.1)
Sodium: 135 mmol/L (ref 135–145)
TCO2: 36 mmol/L — ABNORMAL HIGH (ref 22–32)
pCO2 arterial: 44.6 mmHg (ref 32.0–48.0)
pH, Arterial: 7.497 — ABNORMAL HIGH (ref 7.350–7.450)
pO2, Arterial: 99 mmHg (ref 83.0–108.0)

## 2019-09-03 LAB — PROTIME-INR
INR: 1.5 — ABNORMAL HIGH (ref 0.8–1.2)
Prothrombin Time: 17.1 seconds — ABNORMAL HIGH (ref 11.4–15.2)

## 2019-09-03 LAB — LACTIC ACID, PLASMA: Lactic Acid, Venous: 5.4 mmol/L (ref 0.5–1.9)

## 2019-09-03 LAB — BRAIN NATRIURETIC PEPTIDE: B Natriuretic Peptide: 1982.4 pg/mL — ABNORMAL HIGH (ref 0.0–100.0)

## 2019-09-03 LAB — TROPONIN I (HIGH SENSITIVITY): Troponin I (High Sensitivity): 206 ng/L (ref <18)

## 2019-09-03 LAB — MAGNESIUM: Magnesium: 2 mg/dL (ref 1.7–2.4)

## 2019-09-03 LAB — PHOSPHORUS: Phosphorus: 4.5 mg/dL (ref 2.5–4.6)

## 2019-09-03 MED ORDER — IPRATROPIUM-ALBUTEROL 0.5-2.5 (3) MG/3ML IN SOLN
3.0000 mL | Freq: Once | RESPIRATORY_TRACT | Status: AC
  Administered 2019-09-03: 3 mL via RESPIRATORY_TRACT
  Filled 2019-09-03: qty 3

## 2019-09-03 MED ORDER — IPRATROPIUM BROMIDE 0.02 % IN SOLN
1.0000 mg | Freq: Once | RESPIRATORY_TRACT | Status: DC
  Filled 2019-09-03: qty 5

## 2019-09-03 MED ORDER — FUROSEMIDE 10 MG/ML IJ SOLN
60.0000 mg | Freq: Once | INTRAMUSCULAR | Status: AC
  Administered 2019-09-04: 60 mg via INTRAVENOUS
  Filled 2019-09-03: qty 6

## 2019-09-03 MED ORDER — METHYLPREDNISOLONE SODIUM SUCC 125 MG IJ SOLR
125.0000 mg | Freq: Once | INTRAMUSCULAR | Status: AC
  Administered 2019-09-03: 125 mg via INTRAVENOUS
  Filled 2019-09-03: qty 2

## 2019-09-03 MED ORDER — ALBUTEROL (5 MG/ML) CONTINUOUS INHALATION SOLN
10.0000 mg/h | INHALATION_SOLUTION | Freq: Once | RESPIRATORY_TRACT | Status: AC
  Administered 2019-09-03: 10 mg/h via RESPIRATORY_TRACT
  Filled 2019-09-03: qty 20

## 2019-09-03 NOTE — ED Triage Notes (Signed)
PT BIB [facility] after PT called c/o increase SHOB, dysuria, and weakness. While with EMS pt placed on NRB and [place] ( 20 L) PT has hx of UTI and lung fibrosis.  Upon arrival, RR 39, 92% 15 L NRB, B/P 107/61 , HR 85. RT called to bedside.

## 2019-09-03 NOTE — Progress Notes (Signed)
Obtained ABG results are pH 7.49 CO2 44 PO2 99 and HCO3 34. Results given to MD.

## 2019-09-03 NOTE — ED Provider Notes (Signed)
[facility] EMERGENCY DEPARTMENT Provider Note   CSN: [id number] Arrival date & time: 08/27/2019  2129     History Chief Complaint  Patient presents with  . Respiratory Distress    Felicia [name] is a 49 y.o. female.  HPI Patient has significant past medical history for NSIP,(nonspecific interstitial pneumonitis),history of DVT,type 2 diabetes mellitus, pulmonary hypertension and diastolic heart failure (RV failure). She does have chronic hypoxic respiratory failure, on home oxygen 12 to 15 L/min per nasal cannula.  Patient reports she started getting increasingly short of breath this evening.  She chronically is on 12 to 15 L of oxygen but even trying to turn that up she is not getting relief from shortness of breath.  Patient reports her chest generally feels tight.  She has not had a fever.  No swelling or pain in the legs.  EMS arrived and placed the patient on 20 L nonrebreather mask.    Past Medical History:  Diagnosis Date  . Diabetes mellitus without complication ([place])    type 2   . Hypertension   . NSIP (nonspecific interstitial pneumonitis) ([place])   . Pulmonary hypertension ([place]) 2011  . Respiratory failure with hypoxia ([place]) 05/31/2019  . SVT (supraventricular tachycardia) [facility])     Patient Active Problem List   Diagnosis Date Noted  . Class 2 obesity due to excess calories in adult 06/04/2019  . Hypokalemia 05/31/2019  . History of DVT (deep vein thrombosis) 05/31/2019  . Prolonged Q-T interval on ECG 05/31/2019  . Hypomagnesemia 05/31/2019  . Acute on chronic respiratory failure with hypoxia ([place]) 05/30/2019  . Shingles 04/24/2019  . Chest pain   . CAP (community acquired pneumonia) 03/31/2018  . Interstitial lung disease ([place]) 03/27/2018  . Palpitations 03/20/2018  . Acute on chronic diastolic CHF (congestive heart failure) ([place]) 03/21/2017  . Pulmonary HTN ([place]) 03/21/2017  . Medication management 03/21/2017  . NSIP (nonspecific  interstitial pneumonitis) ([place]) 02/03/2017  . Essential hypertension 02/03/2017  . Dyspnea on exertion   . On home oxygen therapy   . PSVT (paroxysmal supraventricular tachycardia) ([place]) 09/23/2014  . Interstitial pneumonitis ([place]) 09/23/2014  . UNSPEC ALVEOLAR&PARIETOALVEOLAR PNEUMONOPATHY 09/12/2008  . HYPERTENSION 09/11/2008  . BRONCHITIS 09/11/2008    Past Surgical History:  Procedure Laterality Date  . CESAREAN SECTION    . LUNG BIOPSY    . RIGHT HEART CATH N/A 04/26/2019   Procedure: RIGHT HEART CATH;  Surgeon: Larey Dresser, MD;  Location: [facility];  Service: Cardiovascular;  Laterality: N/A;     OB History   No obstetric history on file.     Family History  Problem Relation Age of Onset  . Hypertension Mother   . Cancer Father   . Diabetes Father   . Hypertension Father   . Diabetes Sister   . Mental retardation Sister   . Cancer Maternal Grandmother   . Cancer Maternal Grandfather   . Cancer Paternal Grandmother   . Cancer Paternal Grandfather     Social History   Tobacco Use  . Smoking status: Never Smoker  . Smokeless tobacco: Never Used  . Tobacco comment: never smoked or used tobacco products  Vaping Use  . Vaping Use: Never used  Substance Use Topics  . Alcohol use: No  . Drug use: No    Home Medications Prior to Admission medications   Medication Sig Start Date End Date Taking? Authorizing Provider  acetaminophen (TYLENOL) 500 MG tablet Take 2,000 mg by mouth as needed  for moderate pain.     [provider]  albuterol (PROVENTIL HFA;VENTOLIN HFA) 108 (90 Base) MCG/ACT inhaler Inhale 1 puff into the lungs every 6 (six) hours as needed for wheezing or shortness of breath.    [provider]  apixaban (ELIQUIS) 5 MG TABS tablet Take 1 tablet (5 mg total) by mouth 2 (two) times daily. 08/02/19   Sherrilyn Rist A, MD  atenolol (TENORMIN) 50 MG tablet Take 75 mg by mouth 2 (two) times daily.  01/08/17   [provider]  cholecalciferol (VITAMIN D3) 25 MCG (1000 UT) tablet Take 1,000 Units by mouth daily.    [provider]  furosemide (LASIX) 20 MG tablet Take 3 tablets (60 mg total) by mouth 2 (two) times daily. 06/04/19 07/04/19  Arrien, Jimmy Picket, MD  gabapentin (NEURONTIN) 300 MG capsule Take 1 capsule (300 mg total) by mouth 3 (three) times daily for 7 days. 04/28/19 05/30/19  Cherylann Ratel A, DO  guaiFENesin (MUCINEX) 600 MG 12 hr tablet Take 600 mg by mouth daily.     [provider]  insulin degludec (TRESIBA FLEXTOUCH) 100 UNIT/ML FlexTouch Pen Inject 24 Units into the skin daily.    [provider]  insulin lispro (HUMALOG KWIKPEN) 100 UNIT/ML KwikPen Inject 0-20 Units into the skin 2 (two) times daily. Sliding scale based on sugar level    [provider]  ipratropium-albuterol (DUONEB) 0.5-2.5 (3) MG/3ML SOLN Inhale 3 mLs into the lungs every 6 (six) hours as needed (sob and wheezing).  01/08/17   [provider]  metFORMIN (GLUCOPHAGE-XR) 500 MG 24 hr tablet Take 1,000 mg by mouth at bedtime.  04/10/19   [provider]  mycophenolate (CELLCEPT) 500 MG tablet Take 1,500 mg by mouth 2 (two) times daily.     [provider]  potassium chloride SA (KLOR-CON) 20 MEQ tablet Take 2 tablets (40 mEq total) by mouth daily. 04/29/19 05/30/19  Cherylann Ratel A, DO  predniSONE (DELTASONE) 10 MG tablet TAKE 4 TABLETS BY MOUTH DAILY WITH BREAKFAST 08/02/19   Olalere, Adewale A, MD  sildenafil (REVATIO) 20 MG tablet Take 20 mg by mouth 2 (two) times daily. 04/15/19   [provider]  sulfamethoxazole-trimethoprim (BACTRIM DS) 800-160 MG tablet Take 1 tablet by mouth 3 (three) times a week. 12/24/18   [provider]  verapamil (CALAN-SR) 120 MG CR tablet Take 1 tablet (120 mg total) by mouth at bedtime. 09/23/14   Etta Quill, NP    Allergies    Patient has no known allergies.  Review of Systems   Review of Systems 10 systems  reviewed negative except as per HPI Physical Exam Updated Vital Signs BP 107/81 (BP Location: Right Arm)   Pulse 85   Resp (!) 47   SpO2 98%   Physical Exam Constitutional:      Comments: Patient arrives with tachypnea and moderate to severe increased work of breathing.  She is speaking in one-word responses.  She is alert.  HENT:     Head: Normocephalic.  Eyes:     Extraocular Movements: Extraocular movements intact.  Cardiovascular:     Comments: Borderline tachycardia.  No appreciable rub murmur gallop.  Lot of respiratory and respiratory delivery device machine noise.  Distal pulses 1+ and symmetric. Pulmonary:     Comments: Tachypnea.  Symmetric breath sounds.  Relatively clear with some diminished bases and some fine crackle at bases. Abdominal:     General: There is no distension.  Palpations: Abdomen is soft.     Tenderness: There is no abdominal tenderness. There is no guarding.  Musculoskeletal:        General: No swelling or tenderness. Normal range of motion.     Right lower leg: No edema.     Left lower leg: No edema.  Skin:    General: Skin is warm and dry.     Coloration: Skin is pale.  Neurological:     General: No focal deficit present.     Mental Status: She is oriented to person, place, and time.     Coordination: Coordination normal.  Psychiatric:     Comments: Patient is anxious.     ED Results / Procedures / Treatments   Labs (all labs ordered are listed, but only abnormal results are displayed) Labs Reviewed  COMPREHENSIVE METABOLIC PANEL - Abnormal; Notable for the following components:      Result Value   Potassium 3.3 (*)    Chloride 90 (*)    Glucose, Bld 455 (*)    BUN 28 (*)    Creatinine, Ser 1.09 (*)    Total Protein 5.6 (*)    Total Bilirubin 1.6 (*)    GFR calc non Af Amer 60 (*)    Anion gap 21 (*)    All other components within normal limits  CBC - Abnormal; Notable for the following components:   WBC 13.8 (*)    RBC 5.50  (*)    HCT 47.6 (*)    MCH 25.1 (*)    MCHC 29.0 (*)    RDW 16.5 (*)    nRBC 1.4 (*)    All other components within normal limits  BRAIN NATRIURETIC PEPTIDE - Abnormal; Notable for the following components:   B Natriuretic Peptide 1,982.4 (*)    All other components within normal limits  LACTIC ACID, PLASMA - Abnormal; Notable for the following components:   Lactic Acid, Venous 5.4 (*)    All other components within normal limits  PROTIME-INR - Abnormal; Notable for the following components:   Prothrombin Time 17.1 (*)    INR 1.5 (*)    All other components within normal limits  TROPONIN I (HIGH SENSITIVITY) - Abnormal; Notable for the following components:   Troponin I (High Sensitivity) 206 (*)    All other components within normal limits  SARS CORONAVIRUS 2 BY RT PCR (HOSPITAL ORDER, [place] [facility])  CULTURE, BLOOD (ROUTINE X 2)  CULTURE, BLOOD (ROUTINE X 2)  URINE CULTURE  MAGNESIUM  PHOSPHORUS  LACTIC ACID, PLASMA  URINALYSIS, ROUTINE W REFLEX MICROSCOPIC  I-STAT ARTERIAL BLOOD GAS, ED  TROPONIN I (HIGH SENSITIVITY)    EKG EKG Interpretation  Date/Time:  Tuesday September 03 2019 21:37:09 EDT Ventricular Rate:  89 PR Interval:    QRS Duration: 106 QT Interval:  418 QTC Calculation: 509 R Axis:   143 Text Interpretation: Sinus rhythm Probable left atrial enlargement Low voltage, precordial leads Probable right ventricular hypertrophy Borderline prolonged QT interval Baseline wander in lead(s) II no sig change from previous Confirmed by Charlesetta Shanks [phone]) on 08/24/2019 9:44:09 PM   Radiology DG Chest Portable 1 View  Result Date: 08/12/2019 CLINICAL DATA:  Respiratory distress EXAM: PORTABLE CHEST 1 VIEW COMPARISON:  05/30/2019 FINDINGS: Cardiac shadow is stable. Diffuse interstitial lung changes are again identified and stable. No focal confluent infiltrate is seen superimposed over the chronic interstitial change. No bony abnormality is seen.  IMPRESSION: Chronic fibrotic interstitial disease stable from  the prior exam. Electronically Signed   By: Inez Catalina M.D.   On: 08/25/2019 22:09    Procedures Procedures (including critical care time) CRITICAL CARE Performed by: Charlesetta Shanks   Total critical care time: 45 minutes  Critical care time was exclusive of separately billable procedures and treating other patients.  Critical care was necessary to treat or prevent imminent or life-threatening deterioration.  Critical care was time spent personally by me on the following activities: development of treatment plan with patient and/or surrogate as well as nursing, discussions with consultants, evaluation of patient's response to treatment, examination of patient, obtaining history from patient or surrogate, ordering and performing treatments and interventions, ordering and review of laboratory studies, ordering and review of radiographic studies, pulse oximetry and re-evaluation of patient's condition. Medications Ordered in ED Medications  furosemide (LASIX) injection 60 mg (has no administration in time range)  ipratropium-albuterol (DUONEB) 0.5-2.5 (3) MG/3ML nebulizer solution 3 mL (3 mLs Nebulization Given 09/04/2019 2209)  albuterol (PROVENTIL,VENTOLIN) solution continuous neb (10 mg/hr Nebulization Given 08/10/2019 2209)  methylPREDNISolone sodium succinate (SOLU-MEDROL) 125 mg/2 mL injection 125 mg (125 mg Intravenous Given 08/27/2019 2258)    ED Course  I have reviewed the triage vital signs and the nursing notes.  Pertinent labs & imaging results that were available during my care of the patient were reviewed by me and considered in my medical decision making (see chart for details).  Clinical Course as of Sep 03 2315  Tue Sep 03, 2019  2317 Patient reports feeling improved after DuoNeb, Solu-Medrol and high flow nasal cannula oxygen.   [MP]    Clinical Course User Index [MP] Charlesetta Shanks, MD   MDM  Rules/Calculators/A&P                         Patient presents with history of severe chronic hypoxic respiratory failure.  She has components of pulmonary hypertension, interstitial fibrosis as well as diastolic failure.  Patient is placed on high flow oxygen.  She has been showing improvement over the first 15 to 20 minutes of treatment.  Patient will be given a continuous DuoNeb as well as Solu-Medrol and Lasix.  We will continue to observe.  Given patient's current presentation anticipate admission for hypoxic respiratory failure. Final Clinical Impression(s) / ED Diagnoses Final diagnoses:  Acute and chronic respiratory failure with hypoxia [facility])    Rx / DC Orders ED Discharge Orders    None       Charlesetta Shanks, MD 09/08/19 [phone]

## 2019-09-04 ENCOUNTER — Encounter (HOSPITAL_COMMUNITY): Payer: Self-pay | Admitting: Family Medicine

## 2019-09-04 ENCOUNTER — Encounter: Payer: 59 | Admitting: Pulmonary Disease

## 2019-09-04 DIAGNOSIS — R7989 Other specified abnormal findings of blood chemistry: Secondary | ICD-10-CM | POA: Insufficient documentation

## 2019-09-04 DIAGNOSIS — J849 Interstitial pulmonary disease, unspecified: Secondary | ICD-10-CM

## 2019-09-04 DIAGNOSIS — J9621 Acute and chronic respiratory failure with hypoxia: Secondary | ICD-10-CM | POA: Diagnosis not present

## 2019-09-04 DIAGNOSIS — I270 Primary pulmonary hypertension: Secondary | ICD-10-CM | POA: Diagnosis not present

## 2019-09-04 DIAGNOSIS — L8990 Pressure ulcer of unspecified site, unspecified stage: Secondary | ICD-10-CM | POA: Insufficient documentation

## 2019-09-04 DIAGNOSIS — E1165 Type 2 diabetes mellitus with hyperglycemia: Secondary | ICD-10-CM | POA: Diagnosis present

## 2019-09-04 DIAGNOSIS — R778 Other specified abnormalities of plasma proteins: Secondary | ICD-10-CM | POA: Diagnosis present

## 2019-09-04 LAB — URINALYSIS, ROUTINE W REFLEX MICROSCOPIC
Bilirubin Urine: NEGATIVE
Glucose, UA: >=500 mg/dL — AB
Ketones, ur: NEGATIVE mg/dL
Nitrite: POSITIVE — AB
Protein, ur: 30 mg/dL — AB
Specific Gravity, Urine: 1.012 (ref 1.005–1.030)
WBC, UA: >50 WBC/hpf — ABNORMAL HIGH (ref 0–5)
pH: 6 (ref 5.0–8.0)

## 2019-09-04 LAB — GLUCOSE, CAPILLARY
Glucose-Capillary: 405 mg/dL — ABNORMAL HIGH (ref 70–99)
Glucose-Capillary: 362 mg/dL — ABNORMAL HIGH (ref 70–99)
Glucose-Capillary: 385 mg/dL — ABNORMAL HIGH (ref 70–99)
Glucose-Capillary: 299 mg/dL — ABNORMAL HIGH (ref 70–99)
Glucose-Capillary: 312 mg/dL — ABNORMAL HIGH (ref 70–99)

## 2019-09-04 LAB — CBG MONITORING, ED: Glucose-Capillary: 377 mg/dL — ABNORMAL HIGH (ref 70–99)

## 2019-09-04 LAB — CBC
HCT: 43.5 % (ref 36.0–46.0)
Hemoglobin: 12.5 g/dL (ref 12.0–15.0)
MCH: 24.2 pg — ABNORMAL LOW (ref 26.0–34.0)
MCHC: 28.7 g/dL — ABNORMAL LOW (ref 30.0–36.0)
MCV: 84.1 fL (ref 80.0–100.0)
Platelets: 237 10*3/uL (ref 150–400)
RBC: 5.17 MIL/uL — ABNORMAL HIGH (ref 3.87–5.11)
RDW: 16.1 % — ABNORMAL HIGH (ref 11.5–15.5)
WBC: 9.9 10*3/uL (ref 4.0–10.5)
nRBC: 1.2 % — ABNORMAL HIGH (ref 0.0–0.2)

## 2019-09-04 LAB — SARS CORONAVIRUS 2 BY RT PCR (HOSPITAL ORDER, PERFORMED IN ~~LOC~~ HOSPITAL LAB): SARS Coronavirus 2: NEGATIVE

## 2019-09-04 LAB — LACTIC ACID, PLASMA
Lactic Acid, Venous: 4.1 mmol/L (ref 0.5–1.9)
Lactic Acid, Venous: 2.5 mmol/L (ref 0.5–1.9)

## 2019-09-04 LAB — MRSA PCR SCREENING: MRSA by PCR: NEGATIVE

## 2019-09-04 LAB — TROPONIN I (HIGH SENSITIVITY): Troponin I (High Sensitivity): 193 ng/L (ref <18)

## 2019-09-04 LAB — STREP PNEUMONIAE URINARY ANTIGEN: Strep Pneumo Urinary Antigen: NEGATIVE

## 2019-09-04 MED ORDER — METHYLPREDNISOLONE SODIUM SUCC 125 MG IJ SOLR
60.0000 mg | Freq: Four times a day (QID) | INTRAMUSCULAR | Status: DC
  Administered 2019-09-04 – 2019-09-06 (×6): 60 mg via INTRAVENOUS
  Filled 2019-09-04 (×6): qty 2

## 2019-09-04 MED ORDER — IPRATROPIUM-ALBUTEROL 0.5-2.5 (3) MG/3ML IN SOLN
3.0000 mL | Freq: Four times a day (QID) | RESPIRATORY_TRACT | Status: DC
  Administered 2019-09-04 (×2): 3 mL via RESPIRATORY_TRACT
  Filled 2019-09-04 (×3): qty 3

## 2019-09-04 MED ORDER — APIXABAN 5 MG PO TABS
5.0000 mg | ORAL_TABLET | Freq: Two times a day (BID) | ORAL | Status: DC
  Administered 2019-09-04 – 2019-09-29 (×52): 5 mg via ORAL
  Filled 2019-09-04 (×52): qty 1

## 2019-09-04 MED ORDER — INSULIN ASPART 100 UNIT/ML ~~LOC~~ SOLN
0.0000 [IU] | Freq: Every day | SUBCUTANEOUS | Status: DC
  Administered 2019-09-04: 4 [IU] via SUBCUTANEOUS
  Administered 2019-09-04: 5 [IU] via SUBCUTANEOUS
  Administered 2019-09-06: 4 [IU] via SUBCUTANEOUS
  Administered 2019-09-08: 3 [IU] via SUBCUTANEOUS
  Administered 2019-09-13 – 2019-09-14 (×2): 2 [IU] via SUBCUTANEOUS
  Administered 2019-09-15: 3 [IU] via SUBCUTANEOUS
  Administered 2019-09-16: 2 [IU] via SUBCUTANEOUS
  Administered 2019-09-18: 5 [IU] via SUBCUTANEOUS

## 2019-09-04 MED ORDER — FUROSEMIDE 10 MG/ML IJ SOLN
60.0000 mg | Freq: Two times a day (BID) | INTRAMUSCULAR | Status: DC
  Administered 2019-09-04 – 2019-09-05 (×2): 60 mg via INTRAVENOUS
  Filled 2019-09-04 (×3): qty 6

## 2019-09-04 MED ORDER — VANCOMYCIN HCL 2000 MG/400ML IV SOLN
2000.0000 mg | Freq: Once | INTRAVENOUS | Status: AC
  Administered 2019-09-04: 2000 mg via INTRAVENOUS
  Filled 2019-09-04: qty 400

## 2019-09-04 MED ORDER — ACETAMINOPHEN 325 MG PO TABS
650.0000 mg | ORAL_TABLET | ORAL | Status: DC | PRN
  Administered 2019-09-10 – 2019-09-19 (×3): 650 mg via ORAL
  Filled 2019-09-04 (×4): qty 2

## 2019-09-04 MED ORDER — VANCOMYCIN HCL IN DEXTROSE 1-5 GM/200ML-% IV SOLN
1000.0000 mg | Freq: Three times a day (TID) | INTRAVENOUS | Status: DC
  Filled 2019-09-04: qty 200

## 2019-09-04 MED ORDER — INSULIN ASPART 100 UNIT/ML ~~LOC~~ SOLN
0.0000 [IU] | Freq: Three times a day (TID) | SUBCUTANEOUS | Status: DC
  Administered 2019-09-04: 8 [IU] via SUBCUTANEOUS
  Administered 2019-09-04 (×2): 15 [IU] via SUBCUTANEOUS
  Administered 2019-09-05: 11 [IU] via SUBCUTANEOUS
  Administered 2019-09-05 – 2019-09-06 (×4): 5 [IU] via SUBCUTANEOUS
  Administered 2019-09-06: 3 [IU] via SUBCUTANEOUS
  Administered 2019-09-07: 8 [IU] via SUBCUTANEOUS
  Administered 2019-09-07: 11 [IU] via SUBCUTANEOUS
  Administered 2019-09-08: 8 [IU] via SUBCUTANEOUS
  Administered 2019-09-08: 15 [IU] via SUBCUTANEOUS
  Administered 2019-09-08: 8 [IU] via SUBCUTANEOUS
  Administered 2019-09-09 (×3): 5 [IU] via SUBCUTANEOUS
  Administered 2019-09-10: 8 [IU] via SUBCUTANEOUS
  Administered 2019-09-10: 3 [IU] via SUBCUTANEOUS
  Administered 2019-09-10 – 2019-09-11 (×2): 2 [IU] via SUBCUTANEOUS
  Administered 2019-09-11: 3 [IU] via SUBCUTANEOUS
  Administered 2019-09-11: 8 [IU] via SUBCUTANEOUS
  Administered 2019-09-12: 3 [IU] via SUBCUTANEOUS
  Administered 2019-09-12: 5 [IU] via SUBCUTANEOUS
  Administered 2019-09-12 – 2019-09-13 (×2): 2 [IU] via SUBCUTANEOUS
  Administered 2019-09-13: 5 [IU] via SUBCUTANEOUS
  Administered 2019-09-13: 11 [IU] via SUBCUTANEOUS
  Administered 2019-09-14 (×2): 8 [IU] via SUBCUTANEOUS
  Administered 2019-09-15: 5 [IU] via SUBCUTANEOUS
  Administered 2019-09-16: 15 [IU] via SUBCUTANEOUS
  Administered 2019-09-17: 3 [IU] via SUBCUTANEOUS
  Administered 2019-09-18 (×2): 8 [IU] via SUBCUTANEOUS

## 2019-09-04 MED ORDER — SODIUM CHLORIDE 0.9 % IV SOLN
100.0000 mg | Freq: Two times a day (BID) | INTRAVENOUS | Status: DC
  Administered 2019-09-04: 100 mg via INTRAVENOUS
  Filled 2019-09-04 (×3): qty 100

## 2019-09-04 MED ORDER — VANCOMYCIN HCL IN DEXTROSE 1-5 GM/200ML-% IV SOLN
1000.0000 mg | Freq: Three times a day (TID) | INTRAVENOUS | Status: DC
  Administered 2019-09-04: 1000 mg via INTRAVENOUS
  Filled 2019-09-04: qty 200

## 2019-09-04 MED ORDER — SODIUM CHLORIDE 0.9 % IV BOLUS
500.0000 mL | Freq: Once | INTRAVENOUS | Status: AC
  Administered 2019-09-04: 500 mL via INTRAVENOUS

## 2019-09-04 MED ORDER — INSULIN GLARGINE 100 UNIT/ML ~~LOC~~ SOLN
20.0000 [IU] | Freq: Every day | SUBCUTANEOUS | Status: DC
  Administered 2019-09-04 – 2019-09-06 (×4): 20 [IU] via SUBCUTANEOUS
  Filled 2019-09-04 (×5): qty 0.2

## 2019-09-04 MED ORDER — SODIUM CHLORIDE 0.9% FLUSH
3.0000 mL | Freq: Two times a day (BID) | INTRAVENOUS | Status: DC
  Administered 2019-09-04 – 2019-09-19 (×30): 3 mL via INTRAVENOUS

## 2019-09-04 MED ORDER — ALBUTEROL SULFATE (2.5 MG/3ML) 0.083% IN NEBU
2.5000 mg | INHALATION_SOLUTION | RESPIRATORY_TRACT | Status: DC | PRN
  Administered 2019-09-20: 2.5 mg via RESPIRATORY_TRACT
  Filled 2019-09-04: qty 3

## 2019-09-04 MED ORDER — POTASSIUM CHLORIDE 10 MEQ/100ML IV SOLN
10.0000 meq | INTRAVENOUS | Status: AC
  Administered 2019-09-04: 10 meq via INTRAVENOUS
  Filled 2019-09-04: qty 100

## 2019-09-04 MED ORDER — POTASSIUM CHLORIDE 10 MEQ/100ML IV SOLN
INTRAVENOUS | Status: AC
  Administered 2019-09-04: 10 meq
  Filled 2019-09-04: qty 100

## 2019-09-04 MED ORDER — SODIUM CHLORIDE 0.9% FLUSH
3.0000 mL | INTRAVENOUS | Status: DC | PRN
  Administered 2019-09-04: 3 mL via INTRAVENOUS

## 2019-09-04 MED ORDER — METHYLPREDNISOLONE SODIUM SUCC 40 MG IJ SOLR
40.0000 mg | Freq: Three times a day (TID) | INTRAMUSCULAR | Status: DC
  Administered 2019-09-04 (×2): 40 mg via INTRAVENOUS
  Filled 2019-09-04 (×2): qty 1

## 2019-09-04 MED ORDER — SODIUM CHLORIDE 0.9 % IV SOLN
2.0000 g | Freq: Three times a day (TID) | INTRAVENOUS | Status: DC
  Administered 2019-09-04 (×2): 2 g via INTRAVENOUS
  Filled 2019-09-04 (×4): qty 2

## 2019-09-04 MED ORDER — SODIUM CHLORIDE 0.9 % IV SOLN: 250.0000 mL | INTRAVENOUS | Status: DC | PRN

## 2019-09-04 NOTE — Progress Notes (Signed)
Inpatient Diabetes Program Recommendations  AACE/ADA: New Consensus Statement on Inpatient Glycemic Control (2015)  Target Ranges:  Prepandial:   less than 140 mg/dL      Peak postprandial:   less than 180 mg/dL (1-2 hours)      Critically ill patients:  140 - 180 mg/dL   Lab Results  Component Value Date   GLUCAP 385 (H) 09/04/2019   HGBA1C 8.8 (H) 05/31/2019    Review of Glycemic Control Results for Felicia [name], Felicia [name] (MRN [id number]) as of 09/04/2019 12:08  Ref. Range 09/04/2019 00:32 09/04/2019 02:12 09/04/2019 06:17 09/04/2019 11:32  Glucose-Capillary Latest Ref Range: 70 - 99 mg/dL 377 (H) 405 (H) 362 (H) 385 (H)   Diabetes history: DM 2 Outpatient Diabetes medications:  Lantus 30 units q HS, Metformin 1000 mg q HS Current orders for Inpatient glycemic control:  Novolog moderate tid with meals and HS Solumedrol 40 mg q 8 hours Lantus 20 units q HS Inpatient Diabetes Program Recommendations:    -Consider increasing Lantus to 30 units q HS.  -Also consider adding Novolog 6 units tid with meals (hold if patient eats less than 50%).   Thanks,  Adah Perl, RN, BC-ADM Inpatient Diabetes Coordinator Pager [phone] (8a-5p)

## 2019-09-04 NOTE — Plan of Care (Signed)
  Problem: Health Behavior/Discharge Planning: Goal: Ability to manage health-related needs will improve Outcome: Not Progressing

## 2019-09-04 NOTE — Progress Notes (Signed)
Notified by CCMD that patient had a 5 beat run non-svt. Patient denies chest pain and breathing has improved. Patient resting and asymptomatic. Will continue to monitor

## 2019-09-04 NOTE — Progress Notes (Signed)
Pharmacy Antibiotic Note  Felicia [name] is a 49 y.o. female admitted on 08/21/2019 with pneumonia.  Pharmacy has been consulted for vancomycin dosing.  Plan: Vancomycin 2gm IV x 1 then 1gm IV q8 hours F/u renal function, cultures and clinical course   Temp (24hrs), Avg:98.5 F (36.9 C), Min:98.5 F (36.9 C), Max:98.5 F (36.9 C)  Recent Labs  Lab 08/31/2019 2141  WBC 13.8*  CREATININE 1.09*  LATICACIDVEN 5.4*    CrCl cannot be calculated (Unknown ideal weight.).    No Known Allergies  Thank you for allowing pharmacy to be a part of this patient's care.  Excell Seltzer Poteet 09/04/2019 1:31 AM

## 2019-09-04 NOTE — Significant Event (Signed)
Notified of SBP 57 shortly after arriving to 4E.   SBP had been low 100s in ED. She was given Lasix 60 mg IV around midnight.   Patient is alert and oriented, notes some mild lightheadedness but denies chest pain. Breathing appears to have improved from when she was in the ED.   Plan to give a 500 cc NS bolus now and place holding parameters for Lasix. Her respiratory distress was felt to be in part related to heart failure, so if BP fails to improve with 500 cc bolus, will likely need to start pressors.   She has been cultured and started on broad spectrum antibiotics. PCCM had been asked to see her as well. Discussed plan with patient, primary RN, and rapid response RN at bedside.

## 2019-09-04 NOTE — Progress Notes (Signed)
PROGRESS NOTE    Felicia [name]  RAQ:[id number] DOB: September 02, 1970 DOA: 08/19/2019 PCP: Aretta Nip, MD    Brief Narrative:  Patient was admitted to the hospital with working diagnosis of acute on chronic respiratory failure, due to interstitial lung disease, NSIP flare/ right heart failure/ pulmonary hypertension/core pulmonale.   49 year old female with past medical history for nonspecific incisional pneumonitis, type 2 diabetes mellitus, pulmonary hypertension, right heart failure, DVT and chronic hypoxic respiratory failure 10 to 15 L supplemental oxygen.  She presented with worsening dyspnea, associated with cough with worsening orthopnea.  On her initial physical examination she was in respiratory distress, respiratory rate 40 breaths/min, heart rate 87, blood pressure 105/92, temperature 98.5, oxygen saturation 93% on 25 L high flow nasal cannula.  Her lungs had rales bilaterally, heart S1-S2, present rhythm, soft abdomen, no lower extremity edema. Arterial pH 7.49, PCO2 44.6, PO2 99, bicarb 36, sodium 138, potassium 3.3, chloride 90, bicarb 27, glucose 455, BUN 28, creatinine 1.0, troponin I 206-193.  BNP 1982, white count 13.8, hemoglobin 13.8, hematocrit 47.6, platelets 249.  SARS COVID-19 negative.  Urinalysis 21-50 red cells, more than 50 white cells, specific gravity 1.012. Chest radiograph with bilateral interstitial infiltrates.  EKG 89 bpm, rightward axis, prolonged QTC 500, sinus rhythm, no ST segment or T wave changes.   Assessment & Plan:   Principal Problem:   Acute on chronic respiratory failure with hypoxia (HCC) Active Problems:   Acute on chronic diastolic CHF (congestive heart failure) (HCC)   Interstitial lung disease (HCC)   Hypokalemia   History of DVT (deep vein thrombosis)   Prolonged Q-T interval on ECG   Uncontrolled diabetes mellitus with hyperglycemia (HCC)   Elevated lactic acid level   Elevated troponin   Pressure injury of skin   1. Acute on  chronic hypoxic respiratory failure/ ILD NSIP/ right heart failure with acute on chronic core pulmonale/ pulmonary hypertension. Patient continue to have signs of respiratory distress, currently on 30 L/min per HFCN with oxygen saturation 96%. Blood pressure 112/72 mmHg.   Will continue diuresis with furosemide and continue systemic steroids increase dose to 60 q 6 methylprednisolone. Keep oxygen saturation more than 88%.   Last echocardiogram from 03/21 with mild reduction in RV systolic pressure, will repeat echocardiogram limited to assess RV non invasively, but likely will need more invasive testing with right heart catheterization. Troponin elevation due to heart failure exacerbation, no signs of acute coronary syndrome.   No clinical signs of pulmonary infection, will dc antibiotic therapy for now.   Case discussed with Dr. Elsworth Soho from pulmonary, will call [facility] for possible transfer. Patient is in agreement with plan.    2. Hypokalemia. Will check K today and will continue to follow up on renal function and electrolytes. Check Mg.   3. DVT. Continue anticoagulation with apixaban.   4. Prolonged QTc. Continue telemetry monitoring, avoid qtc prolonging agents.   5. Obesity. Unspecified BMI, follow on weight.   6. T2DM. Continue glucose cover and monitoring with insulin sliding scale and basal insulin.   No flowsheet data found.     Patient continue to be at high risk for worsening hypoxic respiratory failure.   Status is: Inpatient  Remains inpatient appropriate because:IV treatments appropriate due to intensity of illness or inability to take PO   Dispo: The patient is from: Home              Anticipated d/c is to: Home  Anticipated d/c date is: > 3 days              Patient currently is not medically stable to d/c.   DVT prophylaxis: apixaban   Code Status:   full  Family Communication:  No family at the bedside      Nutrition Status:           Skin  Documentation: Pressure Injury 09/04/19 Buttocks Left Stage 2 -  Partial thickness loss of dermis presenting as a shallow open injury with a red, pink wound bed without slough. small circular size of pencil (Active)  09/04/19 0200  Location: Buttocks  Location Orientation: Left  Staging: Stage 2 -  Partial thickness loss of dermis presenting as a shallow open injury with a red, pink wound bed without slough.  Wound Description (Comments): small circular size of pencil  Present on Admission: Yes     Consultants:   Pulmonary    Subjective: Patient continue to have significant dyspnea, worse with exertion and minimal efforts, no cough or chest pain.   Objective: Vitals:   09/04/19 0805 09/04/19 0855 09/04/19 0922 09/04/19 1100  BP: 111/85  (!) 88/71 104/76  Pulse: 85 91 96 93  Resp: 18 (!) 31  (!) 27  Temp: 97.6 F (36.4 C)     TempSrc: Axillary     SpO2: 98% 99%  96%  Weight:        Intake/Output Summary (Last 24 hours) at 09/04/2019 1545 Last data filed at 09/04/2019 4166 Gross per 24 hour  Intake 1034.99 ml  Output --  Net 1034.99 ml   Filed Weights   09/04/19 0422  Weight: 87.8 kg    Examination:   General: positive dyspnea at rest., deconditioned and ill looking appearing  Neurology: Awake and alert, non focal  E ENT: no pallor, no icterus, oral mucosa moist Cardiovascular: No JVD. S1-S2 present, rhythmic, no gallops, rubs, or murmurs. Trace non pitting lower extremity edema. Pulmonary: decreased breath sounds bilaterally, no wheezing, rhonchi or rales. Gastrointestinal. Abdomen protuberant soft and non tender Skin. No rashes Musculoskeletal: no joint deformities     Data Reviewed: I have personally reviewed following labs and imaging studies  CBC: Recent Labs  Lab 08/09/2019 2141 09/02/2019 2224 09/04/19 0354  WBC 13.8*  --  9.9  HGB 13.8 16.0* 12.5  HCT 47.6* 47.0* 43.5  MCV 86.5  --  84.1  PLT 259  --  063   Basic Metabolic Panel: Recent Labs   Lab 09/02/2019 2141 09/02/2019 2224  NA 138 135  K 3.3* 3.1*  CL 90*  --   CO2 27  --   GLUCOSE 455*  --   BUN 28*  --   CREATININE 1.09*  --   CALCIUM 9.4  --   MG 2.0  --   PHOS 4.5  --    GFR: Estimated Creatinine Clearance: 68.3 mL/min (A) (by C-G formula based on SCr of 1.09 mg/dL (H)). Liver Function Tests: Recent Labs  Lab 09/01/2019 2141  AST 25  ALT 29  ALKPHOS 80  BILITOT 1.6*  PROT 5.6*  ALBUMIN 3.5   No results for input(s): LIPASE, AMYLASE in the last 168 hours. No results for input(s): AMMONIA in the last 168 hours. Coagulation Profile: Recent Labs  Lab 09/04/2019 2141  INR 1.5*   Cardiac Enzymes: No results for input(s): CKTOTAL, CKMB, CKMBINDEX, TROPONINI in the last 168 hours. BNP (last 3 results) No results for input(s): PROBNP in the last 8760 hours.  HbA1C: No results for input(s): HGBA1C in the last 72 hours. CBG: Recent Labs  Lab 09/04/19 0032 09/04/19 0212 09/04/19 0617 09/04/19 1132  GLUCAP 377* 405* 362* 385*   Lipid Profile: No results for input(s): CHOL, HDL, LDLCALC, TRIG, CHOLHDL, LDLDIRECT in the last 72 hours. Thyroid Function Tests: No results for input(s): TSH, T4TOTAL, FREET4, T3FREE, THYROIDAB in the last 72 hours. Anemia Panel: No results for input(s): VITAMINB12, FOLATE, FERRITIN, TIBC, IRON, RETICCTPCT in the last 72 hours.    Radiology Studies: I have reviewed all of the imaging during this hospital visit personally     Scheduled Meds: . apixaban  5 mg Oral BID  . furosemide  60 mg Intravenous Q12H  . insulin aspart  0-15 Units Subcutaneous TID WC  . insulin aspart  0-5 Units Subcutaneous QHS  . insulin glargine  20 Units Subcutaneous QHS  . ipratropium-albuterol  3 mL Nebulization Q6H  . methylPREDNISolone (SOLU-MEDROL) injection  40 mg Intravenous Q8H  . sodium chloride flush  3 mL Intravenous Q12H   Continuous Infusions: . sodium chloride    . ceFEPime (MAXIPIME) IV 2 g (09/04/19 1141)  . doxycycline  (VIBRAMYCIN) IV 100 mg (09/04/19 0410)  . vancomycin       LOS: 1 day        Angelica Frandsen Gerome Apley, MD

## 2019-09-04 NOTE — Consult Note (Signed)
Name: Felicia [name] MRN: [id number] DOB: [birth year]    ADMISSION DATE:  08/08/2019 CONSULTATION DATE:  09/04/2019   REFERRING MD :  Delphia Grates, triad  CHIEF COMPLAINT: Respiratory distress, worsening hypoxia  BRIEF PATIENT DESCRIPTION: 49 year old with fibrotic NSIP and severe pulmonary hypertension and chronic hypoxic respiratory failure maintained on 10 to 15 L oxygen at home, admitted with worsening hypoxia.  She follows up at [place] with Dr. Randol Kern and Dr. [name]  SIGNIFICANT EVENTS    STUDIES:  Echocardiogram 04/2019 normal LV function, RVSP 64 , enlarged RV, bubble study negative.  PFTs 02/2017 FVC 1.25 L 36% predicted, FEV1 1.18 L 40% predicted with DLCO of 17   HRCT 02/2019 chronic fibrotic ILD progressed from 2018 with mild focus of honeycombing  Cath 3/ 2021right atrial pressure was 7, PA pressure 60/24 with a mean of 39 and a wedge of 10 her cardiac index was low at 1.91 and output of 3.89. Her PVR is thus 7.  HISTORY OF PRESENT ILLNESS: She has a history of ILD/biopsy-proven fibrotic NSIP dating back to 2010.  Serology has been negative in 2011 except for mildly elevated aldolase and she has been maintained on immunosuppressants, CellCept and prednisone, also Rituxan 6 monthly.  Follows with Dr. Randol Kern at [name].  She has severe pulmonary hypertension, follows with Dr. [name] at [facility] and is maintained on inhaled treprostinil and sildenafil.  Right heart cath from earlier this year as listed above. She reports worsening dyspnea over the past few days, a dry cough and worsening hypoxia for which she sought attention in the ED.  Reports falls, normally ambulates with a walker.  On further questioning she states that worsening may date back a few weeks Initial evaluation showed mild leukocytosis 13.8, lactate of 5.4, BNP 1982, she required high flow nasal cannula with 20 L oxygen and 100%.  She was given Lasix , diuresed 1 L but then developed hypotension overnight and was given a  500 cc fluid bolus.  Empirically started on antibiotics  Prior [facility] records from Dr. [name] were reviewed , and prior office visit with my partner Dr. Gala Murdoch .  She has been advised weight loss prior to being listed for lung transplant. Prior hospitalization from 05/2019 and discharge summary was reviewed, treated with diuresis and systemic steroids with some improvement  PAST MEDICAL HISTORY :   has a past medical history of Diabetes mellitus without complication ([place]), Hypertension, NSIP (nonspecific interstitial pneumonitis) ([place]), Pulmonary hypertension ([place]) (2011), Respiratory failure with hypoxia ([place]) (05/31/2019), and SVT (supraventricular tachycardia) ([place]).  has a past surgical history that includes Cesarean section; Lung biopsy; and RIGHT HEART CATH (N/A, 04/26/2019). Prior to Admission medications   Medication Sig Start Date End Date Taking? Authorizing Provider  acetaminophen (TYLENOL) 500 MG tablet Take 2,000 mg by mouth as needed for moderate pain.    Yes [provider]  albuterol (PROVENTIL HFA;VENTOLIN HFA) 108 (90 Base) MCG/ACT inhaler Inhale 1 puff into the lungs every 6 (six) hours as needed for wheezing or shortness of breath.   Yes [provider]  apixaban (ELIQUIS) 5 MG TABS tablet Take 1 tablet (5 mg total) by mouth 2 (two) times daily. 08/02/19  Yes Olalere, Adewale A, MD  atenolol (TENORMIN) 50 MG tablet Take 75 mg by mouth 2 (two) times daily.  01/08/17  Yes [provider]  calcium citrate (CALCITRATE - DOSED IN MG ELEMENTAL CALCIUM) 950 (200 Ca) MG tablet Take 200 mg of elemental calcium by mouth daily.   Yes [provider]  cholecalciferol (VITAMIN D3) 25 MCG (1000 UT) tablet Take 1,000 Units by mouth daily.   Yes [provider]  escitalopram (LEXAPRO) 10 MG tablet Take 10 mg by mouth daily. 08/20/19  Yes [provider]  furosemide (LASIX) 20 MG tablet Take 3 tablets (60 mg total) by mouth 2 (two) times daily.  06/04/19 09/04/19 Yes Arrien, Jimmy Picket, MD  guaiFENesin (MUCINEX) 600 MG 12 hr tablet Take 600 mg by mouth daily.    Yes [provider]  ibuprofen (ADVIL) 200 MG tablet Take 400 mg by mouth every 6 (six) hours as needed for moderate pain.   Yes [provider]  insulin lispro (HUMALOG KWIKPEN) 100 UNIT/ML KwikPen Inject 0-20 Units into the skin 2 (two) times daily. Sliding scale based on sugar level   Yes [provider]  ipratropium-albuterol (DUONEB) 0.5-2.5 (3) MG/3ML SOLN Inhale 3 mLs into the lungs every 6 (six) hours as needed (sob and wheezing).  01/08/17  Yes [provider]  metFORMIN (GLUCOPHAGE-XR) 500 MG 24 hr tablet Take 1,000 mg by mouth at bedtime.  04/10/19  Yes [provider]  mycophenolate (CELLCEPT) 500 MG tablet Take 1,500 mg by mouth 2 (two) times daily.    Yes [provider]  predniSONE (DELTASONE) 10 MG tablet TAKE 4 TABLETS BY MOUTH DAILY WITH BREAKFAST Patient taking differently: Take 40 mg by mouth daily with breakfast.  08/02/19  Yes Olalere, Adewale A, MD  sildenafil (REVATIO) 20 MG tablet Take 20 mg by mouth 2 (two) times daily. 04/15/19  Yes [provider]  sulfamethoxazole-trimethoprim (BACTRIM DS) 800-160 MG tablet Take 1 tablet by mouth 3 (three) times a week. As needed for sinus infection 12/24/18  Yes [provider]  verapamil (CALAN-SR) 120 MG CR tablet Take 1 tablet (120 mg total) by mouth at bedtime. 09/23/14  Yes Etta Quill, NP  gabapentin (NEURONTIN) 300 MG capsule Take 1 capsule (300 mg total) by mouth 3 (three) times daily for 7 days. 04/28/19 05/30/19  Cherylann Ratel A, DO  insulin degludec (TRESIBA FLEXTOUCH) 100 UNIT/ML FlexTouch Pen Inject 24 Units into the skin daily.    [provider]  LANTUS SOLOSTAR 100 UNIT/ML Solostar Pen Inject 30 Units into the skin at bedtime. 08/28/19   [provider]  potassium chloride SA (KLOR-CON) 20 MEQ tablet Take 2 tablets (40 mEq  total) by mouth daily. 04/29/19 05/30/19  Cherylann Ratel A, DO   Allergies  Allergen Reactions  . Lexapro [Escitalopram] Nausea Only    FAMILY HISTORY:  family history includes Cancer in her father, maternal grandfather, maternal grandmother, paternal grandfather, and paternal grandmother; Diabetes in her father and sister; Hypertension in her father and mother; Mental retardation in her sister. SOCIAL HISTORY:  reports that she has never smoked. She has never used smokeless tobacco. She reports that she does not drink alcohol and does not use drugs.  REVIEW OF SYSTEMS:    Increased dyspnea, dry cough no wheezing or stridor, no sputum production Pedal edema No chest pain, palpitations   Constitutional: Negative for fever, chills, weight loss, malaise/fatigue and diaphoresis.  Gastrointestinal: Negative for heartburn, nausea, vomiting, abdominal pain, diarrhea, constipation, blood in stool and melena.  Genitourinary: Negative for dysuria, urgency, frequency, hematuria and flank pain.  Musculoskeletal: Negative for myalgias, back pain, joint pain and falls.  Skin: Negative for itching and rash.  Neurological: Negative for dizziness, tingling, tremors, sensory change, speech change, focal weakness, seizures, loss of consciousness, weakness and headaches.  SUBJECTIVE:   VITAL SIGNS: Temp:  [97.6 F (36.4 C)-98.5 F (36.9 C)] 97.6 F (36.4 C) (07/28 0805) Pulse Rate:  [82-96] 96 (07/28 0922) Resp:  [18-47] 31 (07/28 0855) BP: (57-114)/(32-95) 88/71 (07/28 0922) SpO2:  [92 %-100 %] 99 % (07/28 0855) FiO2 (%):  [80 %-100 %] 100 % (07/28 0855) Weight:  [87.8 kg] 87.8 kg (07/28 0422)  PHYSICAL EXAMINATION: Gen. Pleasant, well-nourished, in no distress, normal affect , on her laptop and headphones ENT - no pallor,icterus, no post nasal drip, steroid facies Neck: No JVD, no thyromegaly, no carotid bruits Lungs: on HFnC+ NRB, no use of accessory muscles, no dullness to percussion, right  basal fine rales no rhonchi  Cardiovascular: Rhythm regular, heart sounds  normal, no murmurs or gallops, no peripheral edema Abdomen: soft and non-tender, no hepatosplenomegaly, BS normal. Musculoskeletal: No deformities, no cyanosis or clubbing Neuro:  alert, non focal   Recent Labs  Lab 09/06/2019 2141 09/07/2019 2224  NA 138 135  K 3.3* 3.1*  CL 90*  --   CO2 27  --   BUN 28*  --   CREATININE 1.09*  --   GLUCOSE 455*  --    Recent Labs  Lab 08/27/2019 2141 08/09/2019 2224 09/04/19 0354  HGB 13.8 16.0* 12.5  HCT 47.6* 47.0* 43.5  WBC 13.8*  --  9.9  PLT 259  --  237   DG Chest Portable 1 View  Result Date: 09/02/2019 CLINICAL DATA:  Respiratory distress EXAM: PORTABLE CHEST 1 VIEW COMPARISON:  05/30/2019 FINDINGS: Cardiac shadow is stable. Diffuse interstitial lung changes are again identified and stable. No focal confluent infiltrate is seen superimposed over the chronic interstitial change. No bony abnormality is seen. IMPRESSION: Chronic fibrotic interstitial disease stable from the prior exam. Electronically Signed   By: Inez Catalina M.D.   On: 09/02/2019 22:09    ASSESSMENT / PLAN:  Acute on chronic hypoxic respiratory failure -baseline on 10 to 15 L of oxygen, has 2 concentrators at home. Fibrotic NSIP -anti-Jo positive, on immunosuppressants Severe pulmonary hypertension and cor pulmonale -on inhaled Tyvaso and sildenafil  Unclear cause of worsening hypoxia, surprisingly does not seem to have significant pedal edema or JVD to indicate right heart failure .  This is most likely related to disease progression of pulmonary hypertension/ILD or both  Recommend -Agree with diuresis Lasix 60 every 12 -as blood pressure permits. -IV Solu-Medrol 40 every 8  She will need reassessment very likely with right heart cath .  ILD seems to have also progressed compared to 2018, she is already on immunosuppressants. -Would suggest transfer to [place] where her doctors are so that they  can continue evaluation for this.  She may need IV prostacyclin eventually Discussed with attending hospitalist   Kara Mead MD. FCCP. [facility]  If no response to pager , please call [phone]     09/04/2019, 11:31 AM

## 2019-09-04 NOTE — H&P (Signed)
History and Physical    Felicia [name] KWI:[id number] DOB: 02/19/1970 DOA: 09/01/2019  PCP: Aretta Nip, MD   Patient coming from: Home   Chief Complaint: SOB, cough  HPI: Felicia [name] is a 49 y.o. female with medical history significant for nonspecific interstitial pneumonitis, insulin-dependent diabetes mellitus, pulmonary hypertension, right-sided heart failure, chronic 10 to 15 L/min supplemental oxygen requirement, and history of DVT on Eliquis, now presenting to the emergency department with increased shortness of breath.  Patient reports progressively worsening dyspnea and increased cough over the past couple days, becoming severe tonight.  She has felt warm at times but has not documented a fever.  She reports a history of leg swelling but has not noticed any recently.  She reports chronic orthopnea that may have worsened some over the past couple days.  Cough has mainly been nonproductive.  Further history is limited by the patient's clinical condition with respiratory distress.  ED Course: Upon arrival to the ED, patient is found to be afebrile, saturating well on 25 L/min of supplemental oxygen, tachypneic up into the upper 40s, and with blood pressure 107/81.  EKG features sinus rhythm with QTc interval of 509 ms.  Chest x-ray demonstrates stable chronic interstitial lung disease.  Chemistry panel notable for potassium 3.3, glucose 455, and creatinine 1.09.  CBC with leukocytosis to 13,800.  Lactic acid is elevated to 5.4.  Troponin is elevated to 206.  BNP is elevated to 1982.  Blood cultures were collected in the ED and the patient was treated with Lasix, albuterol, DuoNeb's, and IV Solu-Medrol.  COVID-19 PCR is negative.  Review of Systems:  All other systems reviewed and apart from HPI, are negative.  Past Medical History:  Diagnosis Date  . Diabetes mellitus without complication ([place])    type 2   . Hypertension   . NSIP (nonspecific interstitial pneumonitis)  ([place])   . Pulmonary hypertension ([place]) 2011  . Respiratory failure with hypoxia ([place]) 05/31/2019  . SVT (supraventricular tachycardia) ([facility])     Past Surgical History:  Procedure Laterality Date  . CESAREAN SECTION    . LUNG BIOPSY    . RIGHT HEART CATH N/A 04/26/2019   Procedure: RIGHT HEART CATH;  Surgeon: Larey Dresser, MD;  Location: [facility];  Service: Cardiovascular;  Laterality: N/A;    Social History:   reports that she has never smoked. She has never used smokeless tobacco. She reports that she does not drink alcohol and does not use drugs.  No Known Allergies  Family History  Problem Relation Age of Onset  . Hypertension Mother   . Cancer Father   . Diabetes Father   . Hypertension Father   . Diabetes Sister   . Mental retardation Sister   . Cancer Maternal Grandmother   . Cancer Maternal Grandfather   . Cancer Paternal Grandmother   . Cancer Paternal Grandfather      Prior to Admission medications   Medication Sig Start Date End Date Taking? Authorizing Provider  acetaminophen (TYLENOL) 500 MG tablet Take 2,000 mg by mouth as needed for moderate pain.     [provider]  albuterol (PROVENTIL HFA;VENTOLIN HFA) 108 (90 Base) MCG/ACT inhaler Inhale 1 puff into the lungs every 6 (six) hours as needed for wheezing or shortness of breath.    [provider]  apixaban (ELIQUIS) 5 MG TABS tablet Take 1 tablet (5 mg total) by mouth 2 (two) times daily. 08/02/19   Laurin Coder, MD  atenolol (  TENORMIN) 50 MG tablet Take 75 mg by mouth 2 (two) times daily.  01/08/17   [provider]  cholecalciferol (VITAMIN D3) 25 MCG (1000 UT) tablet Take 1,000 Units by mouth daily.    [provider]  furosemide (LASIX) 20 MG tablet Take 3 tablets (60 mg total) by mouth 2 (two) times daily. 06/04/19 07/04/19  Arrien, Jimmy Picket, MD  gabapentin (NEURONTIN) 300 MG capsule Take 1 capsule (300 mg total) by mouth 3 (three) times daily for  7 days. 04/28/19 05/30/19  Cherylann Ratel A, DO  guaiFENesin (MUCINEX) 600 MG 12 hr tablet Take 600 mg by mouth daily.     [provider]  insulin degludec (TRESIBA FLEXTOUCH) 100 UNIT/ML FlexTouch Pen Inject 24 Units into the skin daily.    [provider]  insulin lispro (HUMALOG KWIKPEN) 100 UNIT/ML KwikPen Inject 0-20 Units into the skin 2 (two) times daily. Sliding scale based on sugar level    [provider]  ipratropium-albuterol (DUONEB) 0.5-2.5 (3) MG/3ML SOLN Inhale 3 mLs into the lungs every 6 (six) hours as needed (sob and wheezing).  01/08/17   [provider]  metFORMIN (GLUCOPHAGE-XR) 500 MG 24 hr tablet Take 1,000 mg by mouth at bedtime.  04/10/19   [provider]  mycophenolate (CELLCEPT) 500 MG tablet Take 1,500 mg by mouth 2 (two) times daily.     [provider]  potassium chloride SA (KLOR-CON) 20 MEQ tablet Take 2 tablets (40 mEq total) by mouth daily. 04/29/19 05/30/19  Cherylann Ratel A, DO  predniSONE (DELTASONE) 10 MG tablet TAKE 4 TABLETS BY MOUTH DAILY WITH BREAKFAST 08/02/19   Olalere, Adewale A, MD  sildenafil (REVATIO) 20 MG tablet Take 20 mg by mouth 2 (two) times daily. 04/15/19   [provider]  sulfamethoxazole-trimethoprim (BACTRIM DS) 800-160 MG tablet Take 1 tablet by mouth 3 (three) times a week. 12/24/18   [provider]  verapamil (CALAN-SR) 120 MG CR tablet Take 1 tablet (120 mg total) by mouth at bedtime. 09/23/14   Etta Quill, NP    Physical Exam: Vitals:   08/26/2019 2216 09/04/19 0010 09/04/19 0016 09/04/19 0026  BP:   (!) 105/92 (!) 114/95  Pulse:   87 85  Resp:   (!) 36 (!) 33  Temp:    98.5 F (36.9 C)  TempSrc:    Oral  SpO2: 98% 92% 96% 94%    Constitutional: Tachypneic, no diaphoresis  Eyes: PERTLA, lids and conjunctivae normal ENMT: Mucous membranes are moist. Posterior pharynx clear of any exudate or lesions.   Neck: normal, supple, no masses, no thyromegaly Respiratory:  Tachypneic, speaking 2-3 words between breaths, fine rales bilaterally. No pallor or cyanosis.   Cardiovascular: S1 & S2 heard, regular rate and rhythm. No extremity edema.   Abdomen: No distension, no tenderness, soft. Bowel sounds active.  Musculoskeletal: no clubbing / cyanosis. No joint deformity upper and lower extremities.   Skin: no significant rashes, lesions, ulcers. Warm, dry, well-perfused. Neurologic: CN 2-12 grossly intact. Sensation intact.Moving all extremities.  Psychiatric: Alert and oriented to person, place, and situation. Pleasant and cooperative.    Labs and Imaging on Admission: I have personally reviewed following labs and imaging studies  CBC: Recent Labs  Lab 08/12/2019 2141 08/26/2019 2224  WBC 13.8*  --   HGB 13.8 16.0*  HCT 47.6* 47.0*  MCV 86.5  --   PLT 259  --    Basic Metabolic Panel: Recent Labs  Lab 08/22/2019 2141 08/17/2019 2224  NA 138 135  K 3.3* 3.1*  CL 90*  --   CO2 27  --   GLUCOSE 455*  --   BUN 28*  --   CREATININE 1.09*  --   CALCIUM 9.4  --   MG 2.0  --   PHOS 4.5  --    GFR: CrCl cannot be calculated (Unknown ideal weight.). Liver Function Tests: Recent Labs  Lab 08/14/2019 2141  AST 25  ALT 29  ALKPHOS 80  BILITOT 1.6*  PROT 5.6*  ALBUMIN 3.5   No results for input(s): LIPASE, AMYLASE in the last 168 hours. No results for input(s): AMMONIA in the last 168 hours. Coagulation Profile: Recent Labs  Lab 08/15/2019 2141  INR 1.5*   Cardiac Enzymes: No results for input(s): CKTOTAL, CKMB, CKMBINDEX, TROPONINI in the last 168 hours. BNP (last 3 results) No results for input(s): PROBNP in the last 8760 hours. HbA1C: No results for input(s): HGBA1C in the last 72 hours. CBG: Recent Labs  Lab 09/04/19 0032  GLUCAP 377*   Lipid Profile: No results for input(s): CHOL, HDL, LDLCALC, TRIG, CHOLHDL, LDLDIRECT in the last 72 hours. Thyroid Function Tests: No results for input(s): TSH, T4TOTAL, FREET4, T3FREE, THYROIDAB in  the last 72 hours. Anemia Panel: No results for input(s): VITAMINB12, FOLATE, FERRITIN, TIBC, IRON, RETICCTPCT in the last 72 hours. Urine analysis:    Component Value Date/Time   COLORURINE YELLOW 12/24/2008 1604   APPEARANCEUR CLOUDY (A) 12/24/2008 1604   LABSPEC 1.019 12/24/2008 1604   PHURINE 7.5 12/24/2008 1604   GLUCOSEU NEGATIVE 12/24/2008 1604   HGBUR NEGATIVE 12/24/2008 1604   BILIRUBINUR NEGATIVE 12/24/2008 1604   KETONESUR NEGATIVE 12/24/2008 1604   PROTEINUR NEGATIVE 12/24/2008 1604   UROBILINOGEN 0.2 12/24/2008 1604   NITRITE NEGATIVE 12/24/2008 1604   LEUKOCYTESUR MODERATE (A) 12/24/2008 1604   Sepsis Labs: _0 (procalcitonin:4,lacticidven:4) ) Recent Results (from the past 240 hour(s))  SARS Coronavirus 2 by RT PCR (hospital order, performed in [place] hospital lab) Nasopharyngeal Nasopharyngeal Swab     Status: None   Collection Time: 08/11/2019 11:01 PM   Specimen: Nasopharyngeal Swab  Result Value Ref Range Status   SARS Coronavirus 2 NEGATIVE NEGATIVE Final    Comment: (NOTE) SARS-CoV-2 target nucleic acids are NOT DETECTED.  The SARS-CoV-2 RNA is generally detectable in upper and lower respiratory specimens during the acute phase of infection. The lowest concentration of SARS-CoV-2 viral copies this assay can detect is 250 copies / mL. A negative result does not preclude SARS-CoV-2 infection and should not be used as the sole basis for treatment or other patient management decisions.  A negative result may occur with improper specimen collection / handling, submission of specimen other than nasopharyngeal swab, presence of viral mutation(s) within the areas targeted by this assay, and inadequate number of viral copies (<250 copies / mL). A negative result must be combined with clinical observations, patient history, and epidemiological information.  Fact Sheet for Patients:   StrictlyIdeas.no  Fact Sheet for Healthcare  Providers: BankingDealers.co.za  This test is not yet approved or  cleared by the [place] FDA and has been authorized for detection and/or diagnosis of SARS-CoV-2 by FDA under an Emergency Use Authorization (EUA).  This EUA will remain in effect (meaning this test can be used) for the duration of the COVID-19 declaration under Section 564(b)(1) of the Act, 21 U.S.C. section 360bbb-3(b)(1), unless the authorization is terminated or revoked sooner.  Performed at [facility], [place] [address],  [place], [place] [address]      Radiological Exams on Admission: DG Chest Portable 1 View  Result Date: 09/02/2019 CLINICAL DATA:  Respiratory distress EXAM: PORTABLE CHEST 1 VIEW COMPARISON:  05/30/2019 FINDINGS: Cardiac shadow is stable. Diffuse interstitial lung changes are again identified and stable. No focal confluent infiltrate is seen superimposed over the chronic interstitial change. No bony abnormality is seen. IMPRESSION: Chronic fibrotic interstitial disease stable from the prior exam. Electronically Signed   By: Inez Catalina M.D.   On: 08/12/2019 22:09    EKG: Independently reviewed. Sinus rhythm, QTc 509 ms.   Assessment/Plan   1. Acute on chronic respiratory failure; NSIP; RV failure; PAH  - Patient with NSIP, PAH, RV failure, and chronic 10-15 Lpm oxygen requirement presenting with acute respiratory distress and found to have stable CXR findings, BNP 2000 range, and lactate of 5.4 without fever or hypotension  - COVID pcr is negative  - She has improved in ED with Lasix, HFNC, systemic steroids, and breathing treatments but remains tachypneic in 30s at rest  - Likely multifactorial, concerning for possibly flare in ILD, RV failure/PAH  - Plan to culture sputum, start antibiotics, continue steroids, continue diuresis, continue breathing treatments, and ask PCCM to evaluate    2. Insulin-dependent DM  - A1c was 8.8% in April 2021 and serum glucose 455  in ED without DKA  - Continue CBG checks and insulin, hold metformin   3. Elevated troponin  - Troponin elevated to 206 in ED without chest pain  - Likely secondary to acute respiratory distress and heart failure rather than ACS  - Continue cardiac monitoring, repeat troponin    4. Elevated lactate  - Lactic acid elevated to 5.4 in ED without fever, hypotension, or obvious infection  - Suspect this is related to her acute respiratory distress and possibly metformin use rather than sepsis but blood cultures were collected in ED and broad-spectrum antibiotics started  - Trend lactate, continue antibiotics for now    5. Hypokalemia  - Serum potassium is 3.3 in ED  - Replace, repeat chem panel in am    6. History of DVT  - Continue Eliquis   7. Prolonged QT interval  - QT interval is 509 ms in ED  - Continue cardiac monitoring, replace potassium, minimize QT-prolonging medications     DVT prophylaxis: Eliquis  Code Status: Full  Family Communication: Discussed with patient  Disposition Plan:  Patient is from: Home  Anticipated d/c is to: TBD Anticipated d/c date is: 09/09/19  Patient currently: In respiratory distress  Consults called: PCCM  Admission status: Inpatient     Vianne Bulls, MD [facility]  09/04/2019, 1:27 AM

## 2019-09-05 ENCOUNTER — Inpatient Hospital Stay (HOSPITAL_COMMUNITY): Payer: 59

## 2019-09-05 DIAGNOSIS — I361 Nonrheumatic tricuspid (valve) insufficiency: Secondary | ICD-10-CM | POA: Diagnosis not present

## 2019-09-05 DIAGNOSIS — I5033 Acute on chronic diastolic (congestive) heart failure: Secondary | ICD-10-CM | POA: Diagnosis not present

## 2019-09-05 DIAGNOSIS — L89302 Pressure ulcer of unspecified buttock, stage 2: Secondary | ICD-10-CM

## 2019-09-05 DIAGNOSIS — J9621 Acute and chronic respiratory failure with hypoxia: Secondary | ICD-10-CM | POA: Diagnosis not present

## 2019-09-05 DIAGNOSIS — J849 Interstitial pulmonary disease, unspecified: Secondary | ICD-10-CM | POA: Diagnosis not present

## 2019-09-05 LAB — URINE CULTURE

## 2019-09-05 LAB — MAGNESIUM: Magnesium: 1.8 mg/dL (ref 1.7–2.4)

## 2019-09-05 LAB — CBC
HCT: 42.6 % (ref 36.0–46.0)
Hemoglobin: 12.6 g/dL (ref 12.0–15.0)
MCH: 24.6 pg — ABNORMAL LOW (ref 26.0–34.0)
MCHC: 29.6 g/dL — ABNORMAL LOW (ref 30.0–36.0)
MCV: 83 fL (ref 80.0–100.0)
Platelets: 239 10*3/uL (ref 150–400)
RBC: 5.13 MIL/uL — ABNORMAL HIGH (ref 3.87–5.11)
RDW: 16.2 % — ABNORMAL HIGH (ref 11.5–15.5)
WBC: 13.4 10*3/uL — ABNORMAL HIGH (ref 4.0–10.5)
nRBC: 1.6 % — ABNORMAL HIGH (ref 0.0–0.2)

## 2019-09-05 LAB — BASIC METABOLIC PANEL
Anion gap: 14 (ref 5–15)
Anion gap: 16 — ABNORMAL HIGH (ref 5–15)
BUN: 34 mg/dL — ABNORMAL HIGH (ref 6–20)
BUN: 38 mg/dL — ABNORMAL HIGH (ref 6–20)
CO2: 32 mmol/L (ref 22–32)
CO2: 29 mmol/L (ref 22–32)
Calcium: 8.6 mg/dL — ABNORMAL LOW (ref 8.9–10.3)
Calcium: 8.5 mg/dL — ABNORMAL LOW (ref 8.9–10.3)
Chloride: 93 mmol/L — ABNORMAL LOW (ref 98–111)
Chloride: 94 mmol/L — ABNORMAL LOW (ref 98–111)
Creatinine, Ser: 1.11 mg/dL — ABNORMAL HIGH (ref 0.44–1.00)
Creatinine, Ser: 1.24 mg/dL — ABNORMAL HIGH (ref 0.44–1.00)
GFR calc Af Amer: >60 mL/min (ref >60)
GFR calc Af Amer: 59 mL/min — ABNORMAL LOW (ref >60)
GFR calc non Af Amer: 58 mL/min — ABNORMAL LOW (ref >60)
GFR calc non Af Amer: 51 mL/min — ABNORMAL LOW (ref >60)
Glucose, Bld: 228 mg/dL — ABNORMAL HIGH (ref 70–99)
Glucose, Bld: 217 mg/dL — ABNORMAL HIGH (ref 70–99)
Potassium: 2.9 mmol/L — ABNORMAL LOW (ref 3.5–5.1)
Potassium: 3.6 mmol/L (ref 3.5–5.1)
Sodium: 139 mmol/L (ref 135–145)
Sodium: 139 mmol/L (ref 135–145)

## 2019-09-05 LAB — GLUCOSE, CAPILLARY
Glucose-Capillary: 226 mg/dL — ABNORMAL HIGH (ref 70–99)
Glucose-Capillary: 334 mg/dL — ABNORMAL HIGH (ref 70–99)
Glucose-Capillary: 214 mg/dL — ABNORMAL HIGH (ref 70–99)
Glucose-Capillary: 192 mg/dL — ABNORMAL HIGH (ref 70–99)

## 2019-09-05 LAB — ECHOCARDIOGRAM LIMITED: Weight: 3097.02 oz

## 2019-09-05 LAB — LEGIONELLA PNEUMOPHILA SEROGP 1 UR AG: L. pneumophila Serogp 1 Ur Ag: NEGATIVE

## 2019-09-05 MED ORDER — POTASSIUM CHLORIDE CRYS ER 20 MEQ PO TBCR
40.0000 meq | EXTENDED_RELEASE_TABLET | ORAL | Status: DC
  Administered 2019-09-05: 40 meq via ORAL
  Filled 2019-09-05: qty 2

## 2019-09-05 MED ORDER — MAGNESIUM SULFATE IN D5W 1-5 GM/100ML-% IV SOLN
1.0000 g | Freq: Once | INTRAVENOUS | Status: AC
  Administered 2019-09-05: 1 g via INTRAVENOUS
  Filled 2019-09-05: qty 100

## 2019-09-05 MED ORDER — IPRATROPIUM-ALBUTEROL 0.5-2.5 (3) MG/3ML IN SOLN
3.0000 mL | Freq: Three times a day (TID) | RESPIRATORY_TRACT | Status: DC
  Administered 2019-09-05 – 2019-09-17 (×17): 3 mL via RESPIRATORY_TRACT
  Filled 2019-09-05 (×29): qty 3

## 2019-09-05 MED ORDER — VERAPAMIL HCL ER 120 MG PO TBCR
120.0000 mg | EXTENDED_RELEASE_TABLET | Freq: Every day | ORAL | Status: DC
  Administered 2019-09-05 – 2019-09-08 (×4): 120 mg via ORAL
  Filled 2019-09-05 (×4): qty 1

## 2019-09-05 MED ORDER — MYCOPHENOLATE MOFETIL 250 MG PO CAPS
1500.0000 mg | ORAL_CAPSULE | Freq: Two times a day (BID) | ORAL | Status: DC
  Administered 2019-09-05 – 2019-09-29 (×49): 1500 mg via ORAL
  Filled 2019-09-05 (×53): qty 6

## 2019-09-05 MED ORDER — POTASSIUM CHLORIDE 10 MEQ/100ML IV SOLN
10.0000 meq | INTRAVENOUS | Status: DC
  Administered 2019-09-05: 10 meq via INTRAVENOUS
  Filled 2019-09-05: qty 100

## 2019-09-05 MED ORDER — POTASSIUM CHLORIDE CRYS ER 20 MEQ PO TBCR
40.0000 meq | EXTENDED_RELEASE_TABLET | ORAL | Status: AC
  Administered 2019-09-05 (×2): 40 meq via ORAL
  Filled 2019-09-05 (×2): qty 2

## 2019-09-05 MED ORDER — FUROSEMIDE 10 MG/ML IJ SOLN: 60.0000 mg | Freq: Three times a day (TID) | INTRAMUSCULAR | Status: DC

## 2019-09-05 MED ORDER — FUROSEMIDE 10 MG/ML IJ SOLN
60.0000 mg | Freq: Two times a day (BID) | INTRAMUSCULAR | Status: DC
  Administered 2019-09-07 – 2019-09-08 (×4): 60 mg via INTRAVENOUS
  Filled 2019-09-05 (×7): qty 6

## 2019-09-05 MED ORDER — SILDENAFIL CITRATE 20 MG PO TABS
20.0000 mg | ORAL_TABLET | Freq: Two times a day (BID) | ORAL | Status: DC
  Administered 2019-09-05 – 2019-09-10 (×11): 20 mg via ORAL
  Filled 2019-09-05 (×11): qty 1

## 2019-09-05 NOTE — Progress Notes (Signed)
Inpatient Diabetes Program Recommendations  AACE/ADA: New Consensus Statement on Inpatient Glycemic Control (2015)  Target Ranges:  Prepandial:   less than 140 mg/dL      Peak postprandial:   less than 180 mg/dL (1-2 hours)      Critically ill patients:  140 - 180 mg/dL   Lab Results  Component Value Date   GLUCAP 226 (H) 09/05/2019   HGBA1C 8.8 (H) 05/31/2019    Review of Glycemic Control Results for Felicia [name], Felicia [name] (MRN [id number]) as of 09/05/2019 10:02  Ref. Range 09/04/2019 11:32 09/04/2019 16:28 09/04/2019 21:16 09/05/2019 06:21  Glucose-Capillary Latest Ref Range: 70 - 99 mg/dL 385 (H) 299 (H) 312 (H) 226 (H)    Diabetes history: DM 2 Outpatient Diabetes medications:  Lantus 30 units q HS, Metformin 1000 mg q HS Current orders for Inpatient glycemic control:  Novolog moderate tid with meals and HS Solumedrol 40 mg q 8 hours Lantus 20 units q HS  Inpatient Diabetes Program Recommendations:    -Consider increasing Lantus to 25 units q HS.  -Also consider adding Novolog 4 units tid with meals (hold if patient eats less than 50%).   Thanks, Bronson Curb, MSN, RNC-OB Diabetes Coordinator [phone] (8a-5p)

## 2019-09-05 NOTE — Progress Notes (Addendum)
Name: Felicia [name] MRN: [id number] DOB: [birth year]    ADMISSION DATE:  09/01/2019 CONSULTATION DATE:  09/05/2019   REFERRING MD :  Delphia Grates, [facility]  CHIEF COMPLAINT: Respiratory distress, worsening hypoxia  BRIEF PATIENT DESCRIPTION: 49 year old with fibrotic NSIP and severe pulmonary hypertension and chronic hypoxic respiratory failure maintained on 10 to 15 L oxygen at home, admitted with worsening hypoxia.  She follows up at [facility] with Dr. Randol Kern and Dr. [name]  SIGNIFICANT EVENTS    STUDIES:  Echocardiogram 04/2019 normal LV function, RVSP 64 , enlarged RV, bubble study negative.  PFTs 02/2017 FVC 1.25 L 36% predicted, FEV1 1.18 L 40% predicted with DLCO of 17   HRCT 02/2019 chronic fibrotic ILD progressed from 2018 with mild focus of honeycombing  Cath 3/ 2021right atrial pressure was 7, PA pressure 60/24 with a mean of 39 and a wedge of 10 her cardiac index was low at 1.91 and output of 3.89. Her PVR is thus 7.  HISTORY OF PRESENT ILLNESS: She has a history of ILD/biopsy-proven fibrotic NSIP dating back to 2010.  Serology has been negative in 2011 except for mildly elevated aldolase and she has been maintained on immunosuppressants, CellCept and prednisone, also Rituxan 6 monthly.  Follows with Dr. Randol Kern at [facility].  She has severe pulmonary hypertension, follows with Dr. [name] at [name] and is maintained on inhaled treprostinil and sildenafil.  Right heart cath from earlier this year as listed above. She reports worsening dyspnea over the past few days, a dry cough and worsening hypoxia for which she sought attention in the ED.  Reports falls, normally ambulates with a walker.  On further questioning she states that worsening may date back a few weeks Initial evaluation showed mild leukocytosis 13.8, lactate of 5.4, BNP 1982, she required high flow nasal cannula with 20 L oxygen and 100%.  She was given Lasix , diuresed 1 L but then developed hypotension overnight and was given a  500 cc fluid bolus.  Empirically started on antibiotics  Prior [facility] records from Dr. [name] were reviewed , and prior office visit with my partner Dr. Gala Murdoch .  She has been advised weight loss prior to being listed for lung transplant. Prior hospitalization from 05/2019 and discharge summary was reviewed, treated with diuresis and systemic steroids with some improvement  PAST MEDICAL HISTORY :   has a past medical history of Diabetes mellitus without complication ([place]), Hypertension, NSIP (nonspecific interstitial pneumonitis) ([place]), Pulmonary hypertension ([place]) (2011), Respiratory failure with hypoxia ([place]) (05/31/2019), and SVT (supraventricular tachycardia) ([place]).  has a past surgical history that includes Cesarean section; Lung biopsy; and RIGHT HEART CATH (N/A, 04/26/2019). Prior to Admission medications   Medication Sig Start Date End Date Taking? Authorizing Provider  acetaminophen (TYLENOL) 500 MG tablet Take 2,000 mg by mouth as needed for moderate pain.    Yes [provider]  albuterol (PROVENTIL HFA;VENTOLIN HFA) 108 (90 Base) MCG/ACT inhaler Inhale 1 puff into the lungs every 6 (six) hours as needed for wheezing or shortness of breath.   Yes [provider]  apixaban (ELIQUIS) 5 MG TABS tablet Take 1 tablet (5 mg total) by mouth 2 (two) times daily. 08/02/19  Yes Olalere, Adewale A, MD  atenolol (TENORMIN) 50 MG tablet Take 75 mg by mouth 2 (two) times daily.  01/08/17  Yes [provider]  calcium citrate (CALCITRATE - DOSED IN MG ELEMENTAL CALCIUM) 950 (200 Ca) MG tablet Take 200 mg of elemental calcium by mouth daily.   Yes [provider]  cholecalciferol (VITAMIN D3) 25 MCG (1000 UT) tablet Take 1,000 Units by mouth daily.   Yes [provider]  escitalopram (LEXAPRO) 10 MG tablet Take 10 mg by mouth daily. 08/20/19  Yes [provider]  furosemide (LASIX) 20 MG tablet Take 3 tablets (60 mg total) by mouth 2 (two) times daily.  06/04/19 09/04/19 Yes Arrien, Jimmy Picket, MD  guaiFENesin (MUCINEX) 600 MG 12 hr tablet Take 600 mg by mouth daily.    Yes [provider]  ibuprofen (ADVIL) 200 MG tablet Take 400 mg by mouth every 6 (six) hours as needed for moderate pain.   Yes [provider]  insulin lispro (HUMALOG KWIKPEN) 100 UNIT/ML KwikPen Inject 0-20 Units into the skin 2 (two) times daily. Sliding scale based on sugar level   Yes [provider]  ipratropium-albuterol (DUONEB) 0.5-2.5 (3) MG/3ML SOLN Inhale 3 mLs into the lungs every 6 (six) hours as needed (sob and wheezing).  01/08/17  Yes [provider]  metFORMIN (GLUCOPHAGE-XR) 500 MG 24 hr tablet Take 1,000 mg by mouth at bedtime.  04/10/19  Yes [provider]  mycophenolate (CELLCEPT) 500 MG tablet Take 1,500 mg by mouth 2 (two) times daily.    Yes [provider]  predniSONE (DELTASONE) 10 MG tablet TAKE 4 TABLETS BY MOUTH DAILY WITH BREAKFAST Patient taking differently: Take 40 mg by mouth daily with breakfast.  08/02/19  Yes Olalere, Adewale A, MD  sildenafil (REVATIO) 20 MG tablet Take 20 mg by mouth 2 (two) times daily. 04/15/19  Yes [provider]  sulfamethoxazole-trimethoprim (BACTRIM DS) 800-160 MG tablet Take 1 tablet by mouth 3 (three) times a week. As needed for sinus infection 12/24/18  Yes [provider]  verapamil (CALAN-SR) 120 MG CR tablet Take 1 tablet (120 mg total) by mouth at bedtime. 09/23/14  Yes Etta Quill, NP  gabapentin (NEURONTIN) 300 MG capsule Take 1 capsule (300 mg total) by mouth 3 (three) times daily for 7 days. 04/28/19 05/30/19  Cherylann Ratel A, DO  insulin degludec (TRESIBA FLEXTOUCH) 100 UNIT/ML FlexTouch Pen Inject 24 Units into the skin daily.    [provider]  LANTUS SOLOSTAR 100 UNIT/ML Solostar Pen Inject 30 Units into the skin at bedtime. 08/28/19   [provider]  potassium chloride SA (KLOR-CON) 20 MEQ tablet Take 2 tablets (40 mEq  total) by mouth daily. 04/29/19 05/30/19  Cherylann Ratel A, DO   Allergies  Allergen Reactions  . Lexapro [Escitalopram] Nausea Only    FAMILY HISTORY:  family history includes Cancer in her father, maternal grandfather, maternal grandmother, paternal grandfather, and paternal grandmother; Diabetes in her father and sister; Hypertension in her father and mother; Mental retardation in her sister. SOCIAL HISTORY:  reports that she has never smoked. She has never used smokeless tobacco. She reports that she does not drink alcohol and does not use drugs.   SUBJECTIVE:   She feels like she is breathing better or at least stable.   VITAL SIGNS: Temp:  [97.6 F (36.4 C)-99.4 F (37.4 C)] 97.6 F (36.4 C) (07/29 0827) Pulse Rate:  [41-108] 99 (07/29 0827) Resp:  [19-33] 20 (07/29 0827) BP: (101-128)/(61-94) 128/94 (07/29 0827) SpO2:  [91 %-100 %] 91 % (07/29 0827) FiO2 (%):  [100 %] 100 % (07/29 0811)  PHYSICAL EXAMINATION: General appearance: 49 y.o., female, NAD, conversant obese  Eyes: anicteric sclerae, tracking  HENT: NCAT; oropharynx, MMM Neck: Trachea midline; FROM, supple, lymphadenopathy, no JVD Lungs: basilar crackles  CV: RRR, S1, S2, no MRGs  Abdomen: Soft, non-tender; non-distended, BS present  Extremities: No peripheral edema, radial and DP pulses present bilaterally  Neuro: Alert and oriented to person and place, no focal deficit      Recent Labs  Lab 08/18/2019 2141 08/23/2019 2224 09/05/19 0044  NA 138 135 139  K 3.3* 3.1* 2.9*  CL 90*  --  93*  CO2 27  --  32  BUN 28*  --  34*  CREATININE 1.09*  --  1.11*  GLUCOSE 455*  --  228*   Recent Labs  Lab 08/21/2019 2141 08/15/2019 2141 08/22/2019 2224 09/04/19 0354 09/05/19 0044  HGB 13.8   < > 16.0* 12.5 12.6  HCT 47.6*   < > 47.0* 43.5 42.6  WBC 13.8*  --   --  9.9 13.4*  PLT 259  --   --  237 239   < > = values in this interval not displayed.   DG Chest Portable 1 View  Result Date: 09/06/2019 CLINICAL  DATA:  Respiratory distress EXAM: PORTABLE CHEST 1 VIEW COMPARISON:  05/30/2019 FINDINGS: Cardiac shadow is stable. Diffuse interstitial lung changes are again identified and stable. No focal confluent infiltrate is seen superimposed over the chronic interstitial change. No bony abnormality is seen. IMPRESSION: Chronic fibrotic interstitial disease stable from the prior exam. Electronically Signed   By: Inez Catalina M.D.   On: 09/07/2019 22:09    ASSESSMENT / PLAN:  Acute on chronic hypoxic respiratory failure -baseline on 10 to 15 L of oxygen, has 2 concentrators at home. Fibrotic NSIP -anti-Jo positive, on immunosuppressants Severe pulmonary hypertension and cor pulmonale -on inhaled Tyvaso and sildenafil  Recommend - agree with diuresis  - agree with continue steroids  - she may need a repeat RHC but I would consider transfer to [facility] if they are willing. She may been inpatient initiation of IV prostacyclin but this could make things worse and not always help  - her prognosis is poor overall - would consider palliative input   Pulmonary will follow    Garner Nash, DO [facility] 09/05/2019 10:11 AM

## 2019-09-05 NOTE — Progress Notes (Addendum)
PROGRESS NOTE    Felicia [name]  YDX:[id number] DOB: 15-May-1970 DOA: 08/20/2019 PCP: Aretta Nip, MD    Brief Narrative:  Patient was admitted to the hospital with working diagnosis of acute on chronic respiratory failure, due to interstitial lung disease, NSIP flare/ right heart failure/ pulmonary hypertension/core pulmonale.   49 year old female with past medical history for nonspecific incisional pneumonitis, type 2 diabetes mellitus, pulmonary hypertension, right heart failure, DVT and chronic hypoxic respiratory failure 10 to 15 L supplemental oxygen.  She presented with worsening dyspnea, associated with cough with worsening orthopnea.  On her initial physical examination she was in respiratory distress, respiratory rate 40 breaths/min, heart rate 87, blood pressure 105/92, temperature 98.5, oxygen saturation 93% on 25 L high flow nasal cannula.  Her lungs had rales bilaterally, heart S1-S2, present rhythm, soft abdomen, no lower extremity edema. Arterial pH 7.49, PCO2 44.6, PO2 99, bicarb 36, sodium 138, potassium 3.3, chloride 90, bicarb 27, glucose 455, BUN 28, creatinine 1.0, troponin I 206-193.  BNP 1982, white count 13.8, hemoglobin 13.8, hematocrit 47.6, platelets 249.  SARS COVID-19 negative.  Urinalysis 21-50 red cells, more than 50 white cells, specific gravity 1.012. Chest radiograph with bilateral interstitial infiltrates.  EKG 89 bpm, rightward axis, prolonged QTC 500, sinus rhythm, no ST segment or T wave changes.  Patient continue to have significant dyspnea at rest, has been on non rebreather 15 L/ min plus heated high flow nasal cannula 30 L/ min.   Tolerating well diuresis and high dose systemic steroids. Pulmonary has recommended consultation with [facility] for possible transfer.    Assessment & Plan:   Principal Problem:   Acute on chronic respiratory failure with hypoxia (HCC) Active Problems:   Acute on chronic diastolic CHF (congestive heart failure)  (HCC)   Interstitial lung disease (HCC)   Hypokalemia   History of DVT (deep vein thrombosis)   Prolonged Q-T interval on ECG   Uncontrolled diabetes mellitus with hyperglycemia (HCC)   Elevated lactic acid level   Elevated troponin   Pressure injury of skin    1. Acute on chronic hypoxic respiratory failure/ ILD NSIP/ right heart failure with acute on chronic core pulmonale/ severe pulmonary hypertension.  2019 DLCO 17,  HRCT 2021 with progressive fibrotic ILD,  Right heart cath 04/2019 PA pressure 60/24 with mean 39.   Continue with non re breather 15 L/ min and  30 L/min per HFCN with oxygen saturation 96%. Urine out put documented over last 24 H is 950 ml. Systolic blood pressure is 128 mmHg. Respiratory rate 20 bpm.   Pending limited echocardiogram to assess RV function, and will order a non contrast CT chest (paient has been on anticoagulation and pretest probability for PE is low).   Continue high dose steroids with methylprednisolone 60 mg IV q 6, continue diuresis with furosemide 60 mg bid.   Resume sildenafil 20 mg bid and verapamil along with mycophenolate.    Continue close follow up on renal function and blood pressure.   Will continue to target oxygen saturation more than 88%, plan to reduce non re-breather oxygen flow as tolerated.   Patient with high risk for further deterioration, she may need more advanced therapies for pulmonary hypertension. Will follow with [facility] recommendations.    2. Hypokalemia/ hypomagnesemia, diuretic induced Renal function with serum cr at 1.1 with K down to 2,9 and serum bicarbonate at 32, Mg at 1,8.   Patient had 80 meq of Kcl and 2 g of Mg sulfate ordered, will  follow K this pm., keep K at 4 and Mg at 2. Continue diuresis with furosemide.   3. DVT. On apixaban for anticoagulation   4. Prolonged QTc. On close telemetry monitoring, continue to avoid qtc prolonging agents.   5. Obesity. Unspecified BMI,.   6. Uncontrolled  T2DM with Hgb A1c 8,8. Fasting glucose this am is 228. Will continue glucose cover and monitoring with insulin sliding scale and basal insulin. Patient is tolerating po well.   7. Stage 2 pressure ulcer at the buttocks. Present on admission, continue with local wound care.   Patient continue to be at high risk for worsening hypoxic respiratory failure.   Status is: Inpatient  Remains inpatient appropriate because:Inpatient level of care appropriate due to severity of illness   Dispo: The patient is from: Home              Anticipated d/c is to: Home              Anticipated d/c date is: > 3 days              Patient currently is not medically stable to d/c.  DVT prophylaxis: apixaban   Code Status:   full  Family Communication:  No family at the bedside      Nutrition Status:           Skin Documentation: Pressure Injury 09/04/19 Buttocks Left Stage 2 -  Partial thickness loss of dermis presenting as a shallow open injury with a red, pink wound bed without slough. small circular size of pencil (Active)  09/04/19 0200  Location: Buttocks  Location Orientation: Left  Staging: Stage 2 -  Partial thickness loss of dermis presenting as a shallow open injury with a red, pink wound bed without slough.  Wound Description (Comments): small circular size of pencil  Present on Admission: Yes     Consultants:   pulmonary     Subjective: Patient with persistent dyspnea with minimal efforts, not yet back to baseline, no chest pain, no nausea or vomiting, tolerating po well,   Objective: Vitals:   09/05/19 0300 09/05/19 0500 09/05/19 0811 09/05/19 0827  BP: 101/71   (!) 128/94  Pulse: 80 95  99  Resp: _0 Temp: 98 F (36.7 C)   97.6 F (36.4 C)  TempSrc: Oral   Oral  SpO2: 98% 99% 97% 91%  Weight:        Intake/Output Summary (Last 24 hours) at 09/05/2019 0933 Last data filed at 09/05/2019 0545 Gross per 24 hour  Intake 463.63 ml  Output 950 ml  Net -486.37  ml   Filed Weights   09/04/19 0422  Weight: 87.8 kg    Examination:   General: deconditioned, positive dyspnea at rest.  Neurology: Awake and alert, non focal  E ENT: mild pallor, no icterus, oral mucosa moist. Short and wide neck not able to assess for JVD.  Cardiovascular: . S1-S2 present,  Positive S3 gallop, rhythmic, no rubs, or murmurs. Trace lower extremity edema. Pulmonary: positive breath sounds bilaterally, mild decrease breaths sounds at bases with no wheezing,  rhonchi or significant rales. Gastrointestinal. Abdomen protuberant, soft and non tender.  Skin. No rashes Musculoskeletal: no joint deformities     Data Reviewed: I have personally reviewed following labs and imaging studies  CBC: Recent Labs  Lab 08/22/2019 2141 09/05/2019 2224 09/04/19 0354 09/05/19 0044  WBC 13.8*  --  9.9 13.4*  HGB 13.8 16.0* 12.5 12.6  HCT 47.6*  47.0* 43.5 42.6  MCV 86.5  --  84.1 83.0  PLT 259  --  237 247   Basic Metabolic Panel: Recent Labs  Lab 09/01/2019 2141 08/23/2019 2224 09/05/19 0044  NA 138 135 139  K 3.3* 3.1* 2.9*  CL 90*  --  93*  CO2 27  --  32  GLUCOSE 455*  --  228*  BUN 28*  --  34*  CREATININE 1.09*  --  1.11*  CALCIUM 9.4  --  8.6*  MG 2.0  --  1.8  PHOS 4.5  --   --    GFR: Estimated Creatinine Clearance: 67.1 mL/min (A) (by C-G formula based on SCr of 1.11 mg/dL (H)). Liver Function Tests: Recent Labs  Lab 09/02/2019 2141  AST 25  ALT 29  ALKPHOS 80  BILITOT 1.6*  PROT 5.6*  ALBUMIN 3.5   No results for input(s): LIPASE, AMYLASE in the last 168 hours. No results for input(s): AMMONIA in the last 168 hours. Coagulation Profile: Recent Labs  Lab 08/16/2019 2141  INR 1.5*   Cardiac Enzymes: No results for input(s): CKTOTAL, CKMB, CKMBINDEX, TROPONINI in the last 168 hours. BNP (last 3 results) No results for input(s): PROBNP in the last 8760 hours. HbA1C: No results for input(s): HGBA1C in the last 72 hours. CBG: Recent Labs  Lab  09/04/19 0617 09/04/19 1132 09/04/19 1628 09/04/19 2116 09/05/19 0621  GLUCAP 362* 385* 299* 312* 226*   Lipid Profile: No results for input(s): CHOL, HDL, LDLCALC, TRIG, CHOLHDL, LDLDIRECT in the last 72 hours. Thyroid Function Tests: No results for input(s): TSH, T4TOTAL, FREET4, T3FREE, THYROIDAB in the last 72 hours. Anemia Panel: No results for input(s): VITAMINB12, FOLATE, FERRITIN, TIBC, IRON, RETICCTPCT in the last 72 hours.    Radiology Studies: I have reviewed all of the imaging during this hospital visit personally     Scheduled Meds: . apixaban  5 mg Oral BID  . furosemide  60 mg Intravenous Q12H  . insulin aspart  0-15 Units Subcutaneous TID WC  . insulin aspart  0-5 Units Subcutaneous QHS  . insulin glargine  20 Units Subcutaneous QHS  . ipratropium-albuterol  3 mL Nebulization TID  . methylPREDNISolone (SOLU-MEDROL) injection  60 mg Intravenous Q6H  . potassium chloride SA  40 mEq Oral Q4H  . sodium chloride flush  3 mL Intravenous Q12H   Continuous Infusions: . sodium chloride 10 mL/hr at 09/05/19 0253     LOS: 2 days        Felicia Sturgeon Gerome Apley, MD

## 2019-09-05 NOTE — Progress Notes (Signed)
I spoke with Jarrett Soho, pulmonary specialty.  We discussed patient's condition and current treatment.  Mrs Metheny has worsening hypoxic respiratory failure likely combination of severe pulmonary hypertension and progressive ILD.   Patient on very high oxygen requirements, a potential transfer may require mechanical ventilation that can certainly worsen her pulmonary hypertension and lead to rapid hemodynamic decompensation.  Unclear if intravenous vasodilator will help at this point, there is a chance that this intervention also might be detrimental.  For now we will continue current treatment.  I will consult palliative care during this hospitalization.  Certainly her prognosis is very poor and a tertiary care transfer seems to be high risk for decompensation at this point in time.   I spoke with Mrs, Monroy about her condition, I did explain her that mechanical ventilation likely will trigger rapid hemodynamic decompensation.  For now she wants to remain full code but she agrees with a inpatient palliative care consultation.

## 2019-09-05 NOTE — Progress Notes (Signed)
Pt developed irregular rhythm on  EKG, sinus rhythm with frequent PACs and PVCs. BP within norrmal limits. SPO2 99% on 30 LPM of non rebreathing face mask. RR 30s.  Notified on call provider, Dr. Sherryl Barters. Order received for BMP, CBC stat and EKG 12 leads. Result showed K 2.9, Mg 1.8, EKG posted in chart. WBC 13.4, Hct 42.6, Hb 12.6.  Ordered for KCL and MgSO4, but Pt's unable to tolerate peripheral KCL. Pt stated pain 7-8/10 scale on IV sites, even though, we reduced rate to 25 ml/hr instead of 100 ml/hr per order. IV on left and right AC flushed well with no extravasation or infiltration appeared.  MD made aware, ordered to stop KCl gtt and changed to pill form. We will follow BMP again at am.   Vista Mink

## 2019-09-05 NOTE — Progress Notes (Signed)
Pt's BP 86/67 MAP (74). Rechecked 80/56 MAP (63). Pt asymptomatic. MD notified. Evening IV lasix held. No new orders. Will continue to monitor pt.

## 2019-09-06 DIAGNOSIS — Z7189 Other specified counseling: Secondary | ICD-10-CM

## 2019-09-06 DIAGNOSIS — Z515 Encounter for palliative care: Secondary | ICD-10-CM

## 2019-09-06 LAB — GLUCOSE, CAPILLARY
Glucose-Capillary: 180 mg/dL — ABNORMAL HIGH (ref 70–99)
Glucose-Capillary: 218 mg/dL — ABNORMAL HIGH (ref 70–99)
Glucose-Capillary: 224 mg/dL — ABNORMAL HIGH (ref 70–99)
Glucose-Capillary: 349 mg/dL — ABNORMAL HIGH (ref 70–99)

## 2019-09-06 LAB — BASIC METABOLIC PANEL
Anion gap: 13 (ref 5–15)
BUN: 36 mg/dL — ABNORMAL HIGH (ref 6–20)
CO2: 29 mmol/L (ref 22–32)
Calcium: 8.4 mg/dL — ABNORMAL LOW (ref 8.9–10.3)
Chloride: 96 mmol/L — ABNORMAL LOW (ref 98–111)
Creatinine, Ser: 0.99 mg/dL (ref 0.44–1.00)
GFR calc Af Amer: >60 mL/min (ref >60)
GFR calc non Af Amer: >60 mL/min (ref >60)
Glucose, Bld: 188 mg/dL — ABNORMAL HIGH (ref 70–99)
Potassium: 4.4 mmol/L (ref 3.5–5.1)
Sodium: 138 mmol/L (ref 135–145)

## 2019-09-06 LAB — MAGNESIUM: Magnesium: 2.2 mg/dL (ref 1.7–2.4)

## 2019-09-06 MED ORDER — METHYLPREDNISOLONE SODIUM SUCC 125 MG IJ SOLR
60.0000 mg | Freq: Two times a day (BID) | INTRAMUSCULAR | Status: DC
  Administered 2019-09-06 – 2019-09-12 (×12): 60 mg via INTRAVENOUS
  Filled 2019-09-06 (×12): qty 2

## 2019-09-06 MED ORDER — ENSURE MAX PROTEIN PO LIQD
11.0000 [oz_av] | Freq: Every day | ORAL | Status: DC
  Administered 2019-09-09 – 2019-09-11 (×3): 11 [oz_av] via ORAL
  Filled 2019-09-06 (×6): qty 330

## 2019-09-06 MED ORDER — SIMETHICONE 80 MG PO CHEW
80.0000 mg | CHEWABLE_TABLET | Freq: Four times a day (QID) | ORAL | Status: DC | PRN
  Administered 2019-09-15 – 2019-09-25 (×3): 80 mg via ORAL
  Filled 2019-09-06 (×5): qty 1

## 2019-09-06 NOTE — Progress Notes (Signed)
Plan of care reviewed. Pt's hemonynamically has been stable tonight. Remained afebrile. Tachypnea sometimes at rest, RR 24-30, and dyspnea with exertion.  EKG: sinus rhythm with frequent PACs and PVCs., HR 80s-90s   BP 92/73 - 125/ 77 mmHg. SPO2 100% on 30 LPM of Non-rebreather mask combining with HFNCL 15 LPM.  BP has been soft 92/76 mmHg this am. Lasix 60 mg schedule at 6 am would put on hold and wait to discuss with MD before administration.  Kennyth Lose, RN

## 2019-09-06 NOTE — Consult Note (Signed)
Consultation Note Date: 09/06/2019   Patient Name: Felicia [name]  DOB: Aug 31, 1970  MRN: [id number]  Age / Sex: 49 y.o., female  PCP: Aretta Nip, MD Referring Physician: Tawni Millers  Reason for Consultation: Establishing goals of care  HPI/Patient Profile: 49 y.o. female  with past medical history of nonspecific interstitial pneumonitis, insulin-dependent diabetes, pulmonary hypertension, right-sided heart failure, 10-15L oxygen at home, history of DVT on Eliquis admitted on 09/01/2019 with shortness of breath and cough progressive over past few days prior to admission. Being treated for worsening severe pulmonary hypertension with RV failure. Consideration for transfer to [facility] but high risk for acute decompensation en-route as this would likely require intubation and risks may outweigh the benefits of vasodilator therapy that they could potentially offer. Overall options are very limited in end stage disease.   Clinical Assessment and Goals of Care: I met today with [name]. No visitors at bedside. Felicia [name] talks openly with me today. She shares her emotional/mental health struggles with her declining functional status and physical limitations. She participates in cognitive behavior therapy and notes that this helps. She had a poor reaction to Lexapro. Will request chaplain to visit and follow for support as well. She talks of friends/family that do try to help but they want to help their own way and not necessarily in the ways that [name] needs them to.   Today [name] shares with me that she continues to be very motivated to slowly get her oxygen requirements down and to return home. She desires all treatment to prolong her life and continues to hold out hope for a potential lung transplant. It is very important for her to continue to hope and have positive thinking. At this time she  is not prepared to consider any other alternatives at this time. She does identify her daughter, Felicia [name], as her surrogate Media planner. She has had this disease for 12 years and knows her body. She feels certain that she will improve enough to return home ultimately. She does use 20L oxygen at home provided through [facility].   [name]'s main concerns today are ensuring that her medical team remain patient with her and allow her the time to improve. She requests that her oxygen be allowed to increase during periods of activity to allow for quicker recovery (during bath times or after meals for instance). I discussed this with Dr. Cathlean Sauer.   Foster's other concern are for her home care. Her daughter lives and helps her but works on the weekends. [name] has been trying to find caregiver for the weekend while her daughter works. She has looked into agency for private hire and this has been very expensive. My RN is working to get some names for private caregivers that our local hospice agencies may have. She also talks of needing additional equipment and a ramp at home. CMRN/CSW may assist with equipment when closer to discharge.   All questions/concerns addressed to the best of my ability. Emotional support provided.   Primary Decision Maker PATIENT  SUMMARY OF RECOMMENDATIONS   - Hopeful for improvement and open to all interventions to prolong life at this time - Will need further conversation regarding any scenarios/situations in which she would not desire aggressive and life prolonging measures.   Code Status/Advance Care Planning:  Full code   Symptom Management:   Per attending and PCCM.   Palliative Prophylaxis:   Aspiration, Bowel Regimen and Turn Reposition  Additional Recommendations (Limitations, Scope, Preferences):  Full Scope Treatment  Psycho-social/Spiritual:   Desire for further Chaplaincy support:yes  Prognosis:   Overall prognosis poor with  NISP, severe pulmonary hypertension and cor pulmonale.   Discharge Planning: To Be Determined      Primary Diagnoses: Present on Admission: . Acute on chronic respiratory failure with hypoxia ([place]) . Acute on chronic diastolic CHF (congestive heart failure) ([place]) . Hypokalemia . Interstitial lung disease ([place]) . Prolonged Q-T interval on ECG . Uncontrolled diabetes mellitus with hyperglycemia ([place]) . Elevated lactic acid level . Elevated troponin   I have reviewed the medical record, interviewed the patient and family, and examined the patient. The following aspects are pertinent.  Past Medical History:  Diagnosis Date  . Diabetes mellitus without complication ([place])    type 2   . Hypertension   . NSIP (nonspecific interstitial pneumonitis) ([place])   . Pulmonary hypertension ([place]) 2011  . Respiratory failure with hypoxia ([place]) 05/31/2019  . SVT (supraventricular tachycardia) (HCC)    Social History   Socioeconomic History  . Marital status: Single    Spouse name: Not on file  . Number of children: Not on file  . Years of education: Not on file  . Highest education level: Not on file  Occupational History  . Not on file  Tobacco Use  . Smoking status: Never Smoker  . Smokeless tobacco: Never Used  . Tobacco comment: never smoked or used tobacco products  Vaping Use  . Vaping Use: Never used  Substance and Sexual Activity  . Alcohol use: No  . Drug use: No  . Sexual activity: Not on file  Other Topics Concern  . Not on file  Social History Narrative   One daugther.  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     Social Determinants of Health   Financial Resource Strain:   . Difficulty of Paying Living Expenses:   Food Insecurity:   . Worried About Charity fundraiser in the Last Year:   . Arboriculturist in the Last Year:   Transportation Needs:   . Film/video editor (Medical):   [name] Lack of Transportation (Non-Medical):   Physical Activity:   . Days of Exercise per  Week:   . Minutes of Exercise per Session:   Stress:   . Feeling of Stress :   Social Connections:   . Frequency of Communication with Friends and Family:   . Frequency of Social Gatherings with Friends and Family:   . Attends Religious Services:   . Active Member of Clubs or Organizations:   . Attends Archivist Meetings:   [name] Marital Status:    Family History  Problem Relation Age of Onset  . Hypertension Mother   . Cancer Father   . Diabetes Father   . Hypertension Father   . Diabetes Sister   . Mental retardation Sister   . Cancer Maternal Grandmother   . Cancer Maternal Grandfather   . Cancer Paternal Grandmother   . Cancer Paternal Grandfather    Scheduled Meds: . apixaban  5 mg Oral BID  . furosemide  60 mg Intravenous Q12H  . insulin aspart  0-15 Units Subcutaneous TID WC  . insulin aspart  0-5 Units Subcutaneous QHS  . insulin glargine  20 Units Subcutaneous QHS  . ipratropium-albuterol  3 mL Nebulization TID  . methylPREDNISolone (SOLU-MEDROL) injection  60 mg Intravenous Q12H  . mycophenolate  1,500 mg Oral BID  . sildenafil  20 mg Oral BID  . sodium chloride flush  3 mL Intravenous Q12H  . verapamil  120 mg Oral QHS   Continuous Infusions: . sodium chloride 10 mL/hr at 09/05/19 0253   PRN Meds:.sodium chloride, acetaminophen, albuterol, sodium chloride flush Allergies  Allergen Reactions  . Lexapro [Escitalopram] Nausea Only   Review of Systems  Constitutional: Positive for activity change and appetite change.  Respiratory: Positive for cough and shortness of breath.   Neurological: Positive for weakness.    Physical Exam Vitals and nursing note reviewed.  Constitutional:      General: She is not in acute distress.    Appearance: She is morbidly obese. She is ill-appearing.  Cardiovascular:     Rate and Rhythm: Normal rate.  Pulmonary:     Effort: No tachypnea, accessory muscle usage or respiratory distress.     Comments: Fatigue even  with conversation at times Neurological:     Mental Status: She is alert and oriented to person, place, and time.     Vital Signs: BP 103/77 (BP Location: Right Arm)   Pulse 82   Temp 97.7 F (36.5 C) (Oral)   Resp 23   Wt 87.6 kg   SpO2 97%   BMI 32.14 kg/m  Pain Scale: 0-10   Pain Score: 0-No pain   SpO2: SpO2: 97 % O2 Device:SpO2: 97 % O2 Flow Rate: .O2 Flow Rate (L/min): 30 L/min  IO: Intake/output summary:   Intake/Output Summary (Last 24 hours) at 09/06/2019 1036 Last data filed at 09/05/2019 2300 Gross per 24 hour  Intake 342.5 ml  Output 700 ml  Net -357.5 ml    LBM: Last BM Date: 09/05/19 Baseline Weight: Weight: 87.8 kg Most recent weight: Weight: 87.6 kg     Palliative Assessment/Data:     Time In: 1330 Time Out: 1440 Time Total: 70 min Greater than 50%  of this time was spent counseling and coordinating care related to the above assessment and plan.  Signed by: Vinie Sill, NP Palliative Medicine Team Pager # [phone] (M-F 8a-5p) Team Phone # [phone] (Nights/Weekends)

## 2019-09-06 NOTE — Progress Notes (Signed)
Chaplain responded to Spiritual Consult for support of patient w/ underlying and worsening chronic illness who has limited social support.  Chaplain initiated relationship of care and support.  Chaplain encouraged patient to talk about her illness.  Chaplain offered ministry of presence while patient told of how those close to her don't understand her illness and don't support her in meaningful ways.  Chaplain confirmed patient's feelings and encouraged her to express them. Patient arrived at some decisions for herself that she has agency and strength of mind and determination to accomplish.  Patient requested a Bible which Chaplain delivered.   Chaplain will refer to [place] for follow-up.  De Burrs Chaplain Resident

## 2019-09-06 NOTE — Progress Notes (Signed)
PROGRESS NOTE    Felicia [name]  PJK:[id number] DOB: 11-11-1970 DOA: 08/08/2019 PCP: Aretta Nip, MD    Brief Narrative:  Patient was admitted to the hospital with working diagnosis of acute on chronic respiratory failure, due to interstitial lung disease, NSIP flare/ right heart failure/ pulmonary hypertension/core pulmonale.  49 year old female with past medical history for nonspecific incisional pneumonitis, type 2 diabetes mellitus, pulmonary hypertension, right heart failure, DVT and chronic hypoxic respiratory failure 10 to 15 L supplemental oxygen. She presented with worsening dyspnea, associated with cough with worsening orthopnea.On her initial physical examination she was in respiratory distress, respiratory rate 40 breaths/min, heart rate 87, blood pressure 105/92, temperature 98.5, oxygen saturation 93% on 25 L high flow nasal cannula.Her lungs had rales bilaterally, heart S1-S2, present rhythm, soft abdomen, no lower extremity edema. Arterial pH 7.49, PCO2 44.6, PO2 99, bicarb 36, sodium 138, potassium 3.3, chloride 90, bicarb 27, glucose 455, BUN 28, creatinine 1.0, troponin I 206-193. BNP 1982, white count 13.8, hemoglobin 13.8, hematocrit 47.6, platelets 249.SARS COVID-19 negative. Urinalysis 21-50 red cells, more than 50 white cells, specific gravity 1.012. Chest radiograph with bilateral interstitial infiltrates.EKG 89 bpm, rightward axis, prolonged QTC 500, sinus rhythm, no ST segment or T wave changes.  She has been placed on aggressive diuresis and high dose systemic steroids.   Patient continue to have significant dyspnea at rest, has been on non rebreather 15 L/ min plus heated high flow nasal cannula 30 L/ min.   Consultation wit [facility] over the phone, patient is very high risk for transfer, severe hypoxemic respiratory failure due to combination of ILD and severe pulmonary hypertension. Not clear if vasodilator therapy will make major  difference, and positive pressure ventilation likely with trigger a rapid hemodynamic decompensation.  Palliative care has been consulted.     Assessment & Plan:   Principal Problem:   Acute on chronic respiratory failure with hypoxia (HCC) Active Problems:   Acute on chronic diastolic CHF (congestive heart failure) (HCC)   Interstitial lung disease (HCC)   Hypokalemia   History of DVT (deep vein thrombosis)   Prolonged Q-T interval on ECG   Uncontrolled diabetes mellitus with hyperglycemia (HCC)   Elevated lactic acid level   Elevated troponin   Pressure injury of skin   1. Acute on chronic hypoxic respiratory failure/ ILD NSIP/ acute on chronic right heart failure with acute on chronic core pulmonale/ severe pulmonary hypertension.  2019 DLCO 17,  HRCT 2021 with progressive fibrotic ILD,  Right heart cath 04/2019 PA pressure 60/24 with mean 39.   Patient reports improved in dyspnea. This am able to discontinue non re-breather mask, with good toleration. Continue on heated high flow nasal cannula with 30 L/min and 100% Fi02 with oxygen saturation 94 to 95%. RR 23. Urine output over last 24 H 700 cc.   Follow up echocardiogram with severe and worsening reduction in RV systolic function, with severe elevated pulmonary artery systolic pressure, estimated 73 mmHg.  CT chest fibrosis pattern unchanged.   Likely hypoxic respiratory failure predominately due to worsening severe pulmonary hypertension with RV failure. Looks terminal disease with no major therapeutic options. Transfer to [facility] may required positive pressure ventilation than can trigger rapid hemodynamic decompensation. Unclear if IV vasodilator therapy will help at this point in the course of her disease. The prognosis is very poor.   Continue medical care with:  Systemic steroids, will decrease dose to 60 bid, will continue with furosemide 60 bid, sildenafil, verapamil and mycophenolate.  Keep oxygen saturation more than  88% (at home patient is on 20 L/min per [place])  Follow with palliative care consultation. I again talk to her about advance directives, including possible repercussions of mechanical ventilation and poor prognosis.    2. Hypokalemia/ hypomagnesemia, diuretic induced. Stable renal function with serum cr at 0,99, with K at 4,4 and serum bicarbonate at 29, Mg 2,2  Continue diuresis with furosemide, will follow up urine output and renal function in am.   3. DVT. Continue anticoagulation with apixaban.   4. Prolonged QTc. On close telemetry monitoring, continue to avoid qtc prolonging agents. Continue telemetry monitoring   5. Obesity. Unspecified BMI,.   6. Uncontrolled T2DM with Hgb A1c 8,8. Fasting glucose this am is 188.  Glucose cover and monitoring with insulin sliding scale and continue with basal insulin 20 units qhs.   7. Stage 2 pressure ulcer at the buttocks. Present on admission,  Local wound care.     Patient continue to be at high risk for worsening respiratory failure.   Status is: Inpatient  Remains inpatient appropriate because:Inpatient level of care appropriate due to severity of illness   Dispo: The patient is from: Home              Anticipated d/c is to: Home              Anticipated d/c date is: > 3 days              Patient currently is not medically stable to d/c.   DVT prophylaxis: apixaban   Code Status:    full  Family Communication:  No family at the bedside      Nutrition Status:           Skin Documentation: Pressure Injury 09/04/19 Buttocks Left Stage 2 -  Partial thickness loss of dermis presenting as a shallow open injury with a red, pink wound bed without slough. small circular size of pencil (Active)  09/04/19 0200  Location: Buttocks  Location Orientation: Left  Staging: Stage 2 -  Partial thickness loss of dermis presenting as a shallow open injury with a red, pink wound bed without slough.  Wound Description (Comments):  small circular size of pencil  Present on Admission: Yes     Consultants:   Pulmonary     Subjective: Patient reports improved dyspnea, but not yet back to baseline, no nausea or vomiting, no chest pain. Continue to have dyspnea with minimal efforts.   Objective: Vitals:   09/05/19 2324 09/06/19 0002 09/06/19 0417 09/06/19 0800  BP:  100/72 92/73 103/77  Pulse: 87 97 83 82  Resp: (!) 29 (!) _0 Temp: 97.6 F (36.4 C)  (!) 97.1 F (36.2 C) 97.7 F (36.5 C)  TempSrc: Axillary  Axillary Oral  SpO2: 97% 100% 100% 98%  Weight:   87.6 kg     Intake/Output Summary (Last 24 hours) at 09/06/2019 0810 Last data filed at 09/05/2019 2300 Gross per 24 hour  Intake 342.5 ml  Output 700 ml  Net -357.5 ml   Filed Weights   09/04/19 0422 09/06/19 0417  Weight: 87.8 kg 87.6 kg    Examination:   General: positive dyspnea at rest, deconditioned and ill looking appearing  Neurology: Awake and alert, non focal  E ENT: no pallor, no icterus, oral mucosa moist Cardiovascular: No JVD. S1-S2 present, rhythmic, positive S4 gallop, rubs, or murmurs. Trace non pitting lower extremity edema. Pulmonary: positive breath sounds bilaterally, decreased  breath sounds at bases.  Gastrointestinal. Abdomen soft and non tender Skin. No rashes Musculoskeletal: no joint deformities     Data Reviewed: I have personally reviewed following labs and imaging studies  CBC: Recent Labs  Lab 08/21/2019 2141 09/05/2019 2224 09/04/19 0354 09/05/19 0044  WBC 13.8*  --  9.9 13.4*  HGB 13.8 16.0* 12.5 12.6  HCT 47.6* 47.0* 43.5 42.6  MCV 86.5  --  84.1 83.0  PLT 259  --  237 612   Basic Metabolic Panel: Recent Labs  Lab 08/16/2019 2141 08/17/2019 2224 09/05/19 0044 09/05/19 1701 09/06/19 0437  NA 138 135 139 139 138  K 3.3* 3.1* 2.9* 3.6 4.4  CL 90*  --  93* 94* 96*  CO2 27  --  32 29 29  GLUCOSE 455*  --  228* 217* 188*  BUN 28*  --  34* 38* 36*  CREATININE 1.09*  --  1.11* 1.24* 0.99    CALCIUM 9.4  --  8.6* 8.5* 8.4*  MG 2.0  --  1.8  --  2.2  PHOS 4.5  --   --   --   --    GFR: Estimated Creatinine Clearance: 75.1 mL/min (by C-G formula based on SCr of 0.99 mg/dL). Liver Function Tests: Recent Labs  Lab 08/14/2019 2141  AST 25  ALT 29  ALKPHOS 80  BILITOT 1.6*  PROT 5.6*  ALBUMIN 3.5   No results for input(s): LIPASE, AMYLASE in the last 168 hours. No results for input(s): AMMONIA in the last 168 hours. Coagulation Profile: Recent Labs  Lab 08/16/2019 2141  INR 1.5*   Cardiac Enzymes: No results for input(s): CKTOTAL, CKMB, CKMBINDEX, TROPONINI in the last 168 hours. BNP (last 3 results) No results for input(s): PROBNP in the last 8760 hours. HbA1C: No results for input(s): HGBA1C in the last 72 hours. CBG: Recent Labs  Lab 09/05/19 0621 09/05/19 1118 09/05/19 1640 09/05/19 2105 09/06/19 0605  GLUCAP 226* 334* 214* 192* 180*   Lipid Profile: No results for input(s): CHOL, HDL, LDLCALC, TRIG, CHOLHDL, LDLDIRECT in the last 72 hours. Thyroid Function Tests: No results for input(s): TSH, T4TOTAL, FREET4, T3FREE, THYROIDAB in the last 72 hours. Anemia Panel: No results for input(s): VITAMINB12, FOLATE, FERRITIN, TIBC, IRON, RETICCTPCT in the last 72 hours.    Radiology Studies: I have reviewed all of the imaging during this hospital visit personally     Scheduled Meds: . apixaban  5 mg Oral BID  . furosemide  60 mg Intravenous Q12H  . insulin aspart  0-15 Units Subcutaneous TID WC  . insulin aspart  0-5 Units Subcutaneous QHS  . insulin glargine  20 Units Subcutaneous QHS  . ipratropium-albuterol  3 mL Nebulization TID  . methylPREDNISolone (SOLU-MEDROL) injection  60 mg Intravenous Q6H  . mycophenolate  1,500 mg Oral BID  . sildenafil  20 mg Oral BID  . sodium chloride flush  3 mL Intravenous Q12H  . verapamil  120 mg Oral QHS   Continuous Infusions: . sodium chloride 10 mL/hr at 09/05/19 0253     LOS: 3 days         Christophe Rising Gerome Apley, MD

## 2019-09-07 LAB — BASIC METABOLIC PANEL
Anion gap: 14 (ref 5–15)
BUN: 36 mg/dL — ABNORMAL HIGH (ref 6–20)
CO2: 24 mmol/L (ref 22–32)
Calcium: 8.3 mg/dL — ABNORMAL LOW (ref 8.9–10.3)
Chloride: 94 mmol/L — ABNORMAL LOW (ref 98–111)
Creatinine, Ser: 0.85 mg/dL (ref 0.44–1.00)
GFR calc Af Amer: >60 mL/min (ref >60)
GFR calc non Af Amer: >60 mL/min (ref >60)
Glucose, Bld: 317 mg/dL — ABNORMAL HIGH (ref 70–99)
Potassium: 4.4 mmol/L (ref 3.5–5.1)
Sodium: 132 mmol/L — ABNORMAL LOW (ref 135–145)

## 2019-09-07 LAB — MAGNESIUM: Magnesium: 2.2 mg/dL (ref 1.7–2.4)

## 2019-09-07 LAB — GLUCOSE, CAPILLARY
Glucose-Capillary: 297 mg/dL — ABNORMAL HIGH (ref 70–99)
Glucose-Capillary: 341 mg/dL — ABNORMAL HIGH (ref 70–99)
Glucose-Capillary: 423 mg/dL — ABNORMAL HIGH (ref 70–99)

## 2019-09-07 MED ORDER — INSULIN ASPART 100 UNIT/ML ~~LOC~~ SOLN
7.0000 [IU] | Freq: Once | SUBCUTANEOUS | Status: AC
  Administered 2019-09-07: 7 [IU] via SUBCUTANEOUS

## 2019-09-07 MED ORDER — INSULIN GLARGINE 100 UNIT/ML ~~LOC~~ SOLN
30.0000 [IU] | Freq: Every day | SUBCUTANEOUS | Status: DC
  Administered 2019-09-07 – 2019-09-08 (×2): 30 [IU] via SUBCUTANEOUS
  Filled 2019-09-07 (×3): qty 0.3

## 2019-09-07 NOTE — Progress Notes (Signed)
PROGRESS NOTE    Felicia [name]  CVE:[id number] DOB: 05/09/1970 DOA: 08/17/2019 PCP: Aretta Nip, MD    Brief Narrative:  Patient was admitted to the hospital with working diagnosis of acute on chronic respiratory failure, due to interstitial lung disease, NSIP flare/ right heart failure/ pulmonary hypertension/core pulmonale.  49 year old female with past medical history for nonspecific incisional pneumonitis, type 2 diabetes mellitus, pulmonary hypertension, right heart failure, DVT and chronic hypoxic respiratory failure 10 to 15 L supplemental oxygen. She presented with worsening dyspnea, associated with cough with worsening orthopnea.On her initial physical examination she was in respiratory distress, respiratory rate 40 breaths/min, heart rate 87, blood pressure 105/92, temperature 98.5, oxygen saturation 93% on 25 L high flow nasal cannula.Her lungs had rales bilaterally, heart S1-S2, present rhythm, soft abdomen, no lower extremity edema. Arterial pH 7.49, PCO2 44.6, PO2 99, bicarb 36, sodium 138, potassium 3.3, chloride 90, bicarb 27, glucose 455, BUN 28, creatinine 1.0, troponin I 206-193. BNP 1982, white count 13.8, hemoglobin 13.8, hematocrit 47.6, platelets 249.SARS COVID-19 negative. Urinalysis 21-50 red cells, more than 50 white cells, specific gravity 1.012. Chest radiograph with bilateral interstitial infiltrates.EKG 89 bpm, rightward axis, prolonged QTC 500, sinus rhythm, no ST segment or T wave changes.  She has been placed on aggressive diuresis and high dose systemic steroids.   Patient continue to have significant dyspnea at rest, has been on non rebreather 15 L/ min plus heated high flow nasal cannula 30 L/ min.   Consultation wit [facility] over the phone, patient is very high risk for transfer, severe hypoxemic respiratory failure due to combination of ILD and severe pulmonary hypertension. Not clear if vasodilator therapy will make major  difference, and positive pressure ventilation likely with trigger a rapid hemodynamic decompensation.  Echocardiogram with worsening RV systolic failure, CT chest with stable ILD pattern.   Likely hypoxic respiratory failure predominately due to worsening severe pulmonary hypertension with RV failure. Looks terminal disease with no major therapeutic options. Transfer to [facility] may required positive pressure ventilation than can trigger rapid hemodynamic decompensation. Unclear if IV vasodilator therapy will help at this point in the course of her disease. The prognosis is very poor.    Palliative care has been consulted.   Non re-breather mask has been removed with good toleration, patient continue to use 30 l/min per HFNC.    Assessment & Plan:   Principal Problem:   Acute on chronic respiratory failure with hypoxia (HCC) Active Problems:   Acute on chronic diastolic CHF (congestive heart failure) (HCC)   Interstitial lung disease (HCC)   Hypokalemia   History of DVT (deep vein thrombosis)   Prolonged Q-T interval on ECG   Uncontrolled diabetes mellitus with hyperglycemia (HCC)   Elevated troponin   Pressure injury of skin   Palliative care by specialist   1. Acute on chronic hypoxic respiratory failure/ ILD NSIP/ acute on chronic right heart failure with acute on chronic core pulmonale/severepulmonary hypertension. 2019 DLCO 17,  HRCT 2021 with progressive fibrotic ILD,  Right heart cath 04/2019 PA pressure 60/24 with mean 39.  Follow up echocardiogram with severe and worsening reduction in RV systolic function, with severe elevated pulmonary artery systolic pressure, estimated 73 mmHg.  CT chest fibrosis pattern unchanged.   Patient with persistent dyspnea, worse with minimal efforts. Her oxygenation has been 96% on 100% fi02 and 30 L/min per HFNC. Urine output over last 24 H is 700.   Will decrease Fi02 to 90% to target oxygen saturation more  than 88%, to prevent further  toxicity/ lung injury due  from high Fi02.   Medical therapy with: Methylprednisolone 60 bid Furosemide 60 bid,  Sildenafil Verapamil  Mycophenolate.  I have talk to her about high risk for rehospitalization and progressive worsening pulmonary hypertension.    Plan wean off Fi02 at least dow to 60 % before discharge home.   2. Hypokalemia/ hyponatremia/ hypomagnesemia, diuretic induced. Renal function stable with serum cr at 0,85 with K at 4,4 and serum bicarbonate at 24. Mg is 2,2,  Continue diuresis with furosemide as tolerated.   3. DVT.On apixaban for anticoagulation.   4. Prolonged QTc.On close telemetry monitoring,continue toavoid qtc prolonging agents.   On bedside telemetry monitoring   5. Obesity. Unspecified BMI,.   6.UncontrolledT2DMwith Hgb A1c 8,8.This am fasting glucose was 317. Capillary 224, 349, 297.  Continue with glucose cover and monitoring with insulin sliding scale, will increase basal insulin to 30 units at night.     7. Stage 2 pressure ulcer at the buttocks. Present on admission, Continue with local wound care.  Patient continue to be at high risk for worsening hypoxic respiratory failure   Status is: Inpatient  Remains inpatient appropriate because:IV treatments appropriate due to intensity of illness or inability to take PO   Dispo: The patient is from: Home              Anticipated d/c is to: Home              Anticipated d/c date is: > 3 days              Patient currently is not medically stable to d/c. Plan to reach at least Fi02 60% before discharge home. She uses at home 2 oxygen concentrators and gets 20 L/min per [place].     DVT prophylaxis: apixaban   Code Status:   full  Family Communication:  Her daughter was at the bedside, she did not have any questions.      Nutrition Status:           Skin Documentation: Pressure Injury 09/04/19 Buttocks Left Stage 2 -  Partial thickness loss of dermis presenting as a  shallow open injury with a red, pink wound bed without slough. small circular size of pencil (Active)  09/04/19 0200  Location: Buttocks  Location Orientation: Left  Staging: Stage 2 -  Partial thickness loss of dermis presenting as a shallow open injury with a red, pink wound bed without slough.  Wound Description (Comments): small circular size of pencil  Present on Admission: Yes     Consultants:   Pulmonary      Subjective: Patient with improvement in dyspnea but not yet back to baseline, no nausea or vomiting,. Positive dysuria and lower abdominal pain, positive odor urine.   Objective: Vitals:   09/07/19 0500 09/07/19 0800 09/07/19 0832 09/07/19 1138  BP: 123/80 119/85  (!) 110/87  Pulse: 97 88 98 103  Resp: (!) 26 (!) 31 (!) 24 (!) 34  Temp: (!) 97.2 F (36.2 C) 97.6 F (36.4 C)  97.6 F (36.4 C)  TempSrc: Axillary Oral  Oral  SpO2: 95% 93%  96%  Weight: 86.9 kg       Intake/Output Summary (Last 24 hours) at 09/07/2019 1150 Last data filed at 09/07/2019 0832 Gross per 24 hour  Intake 123 ml  Output 1700 ml  Net -1577 ml   Filed Weights   09/04/19 0422 09/06/19 0417 09/07/19 0500  Weight: 87.8 kg  87.6 kg 86.9 kg    Examination:   General: positive dyspnea at rest. Deconditioned and ill looking appearing  Neurology: Awake and alert, non focal  E ENT: mild pallor, no icterus, oral mucosa moist Cardiovascular: No JVD. S1-S2 present, rhythmic, no gallops, rubs, or murmurs. Trace lower extremity edema. Pulmonary: positive breath sounds bilaterally, decreased breath sounds bilaterally Gastrointestinal. Abdomen soft and non tender Skin. No rashes Musculoskeletal: no joint deformities     Data Reviewed: I have personally reviewed following labs and imaging studies  CBC: Recent Labs  Lab 08/13/2019 2141 08/19/2019 2224 09/04/19 0354 09/05/19 0044  WBC 13.8*  --  9.9 13.4*  HGB 13.8 16.0* 12.5 12.6  HCT 47.6* 47.0* 43.5 42.6  MCV 86.5  --  84.1 83.0  PLT  259  --  237 132   Basic Metabolic Panel: Recent Labs  Lab 08/28/2019 2141 08/29/2019 2141 08/13/2019 2224 09/05/19 0044 09/05/19 1701 09/06/19 0437 09/07/19 0200  NA 138   < > 135 139 139 138 132*  K 3.3*   < > 3.1* 2.9* 3.6 4.4 4.4  CL 90*  --   --  93* 94* 96* 94*  CO2 27  --   --  32 _0 GLUCOSE 455*  --   --  228* 217* 188* 317*  BUN 28*  --   --  34* 38* 36* 36*  CREATININE 1.09*  --   --  1.11* 1.24* 0.99 0.85  CALCIUM 9.4  --   --  8.6* 8.5* 8.4* 8.3*  MG 2.0  --   --  1.8  --  2.2 2.2  PHOS 4.5  --   --   --   --   --   --    < > = values in this interval not displayed.   GFR: Estimated Creatinine Clearance: 87.2 mL/min (by C-G formula based on SCr of 0.85 mg/dL). Liver Function Tests: Recent Labs  Lab 08/09/2019 2141  AST 25  ALT 29  ALKPHOS 80  BILITOT 1.6*  PROT 5.6*  ALBUMIN 3.5   No results for input(s): LIPASE, AMYLASE in the last 168 hours. No results for input(s): AMMONIA in the last 168 hours. Coagulation Profile: Recent Labs  Lab 09/05/2019 2141  INR 1.5*   Cardiac Enzymes: No results for input(s): CKTOTAL, CKMB, CKMBINDEX, TROPONINI in the last 168 hours. BNP (last 3 results) No results for input(s): PROBNP in the last 8760 hours. HbA1C: No results for input(s): HGBA1C in the last 72 hours. CBG: Recent Labs  Lab 09/06/19 0605 09/06/19 1202 09/06/19 1656 09/06/19 2131 09/07/19 0618  GLUCAP 180* 218* 224* 349* 297*   Lipid Profile: No results for input(s): CHOL, HDL, LDLCALC, TRIG, CHOLHDL, LDLDIRECT in the last 72 hours. Thyroid Function Tests: No results for input(s): TSH, T4TOTAL, FREET4, T3FREE, THYROIDAB in the last 72 hours. Anemia Panel: No results for input(s): VITAMINB12, FOLATE, FERRITIN, TIBC, IRON, RETICCTPCT in the last 72 hours.    Radiology Studies: I have reviewed all of the imaging during this hospital visit personally     Scheduled Meds: . apixaban  5 mg Oral BID  . furosemide  60 mg Intravenous Q12H  .  insulin aspart  0-15 Units Subcutaneous TID WC  . insulin aspart  0-5 Units Subcutaneous QHS  . insulin glargine  20 Units Subcutaneous QHS  . ipratropium-albuterol  3 mL Nebulization TID  . methylPREDNISolone (SOLU-MEDROL) injection  60 mg Intravenous Q12H  . mycophenolate  1,500 mg Oral BID  .  Ensure Max Protein  11 oz Oral Daily  . sildenafil  20 mg Oral BID  . sodium chloride flush  3 mL Intravenous Q12H  . verapamil  120 mg Oral QHS   Continuous Infusions: . sodium chloride 10 mL/hr at 09/05/19 0253     LOS: 4 days        Ercel Pepitone Gerome Apley, MD

## 2019-09-07 NOTE — Discharge Instructions (Addendum)
Heart Failure Action Plan A heart failure action plan helps you understand what to do when you have symptoms of heart failure. Follow the plan that was created by you and your health care provider. Review your plan each time you visit your health care provider. Red zone These signs and symptoms mean you should get medical help right away:  You have trouble breathing when resting.  You have a dry cough that is getting worse.  You have swelling or pain in your legs or abdomen that is getting worse.  You suddenly gain more than 2-3 lb (0.9-1.4 kg) in a day, or more than 5 lb (2.3 kg) in one week. This amount may be more or less depending on your condition.  You have trouble staying awake or you feel confused.  You have chest pain.  You do not have an appetite.  You pass out. If you experience any of these symptoms:  Call your local emergency services (911 in the [place] right away or seek help at the emergency department of the nearest hospital. Yellow zone These signs and symptoms mean your condition may be getting worse and you should make some changes:  You have trouble breathing when you are active or you need to sleep with extra pillows.  You have swelling in your legs or abdomen.  You gain 2-3 lb (0.9-1.4 kg) in one day, or 5 lb (2.3 kg) in one week. This amount may be more or less depending on your condition.  You get tired easily.  You have trouble sleeping.  You have a dry cough. If you experience any of these symptoms:  Contact your health care provider within the next day.  Your health care provider may adjust your medicines. Green zone These signs mean you are doing well and can continue what you are doing:  You do not have shortness of breath.  You have very little swelling or no new swelling.  Your weight is stable (no gain or loss).  You have a normal activity level.  You do not have chest pain or any other new symptoms. Follow these instructions at  home:  Take over-the-counter and prescription medicines only as told by your health care provider.  Weigh yourself daily. Your target weight is __________ lb (__________ kg). ? Call your health care provider if you gain more than __________ lb (__________ kg) in a day, or more than __________ lb (__________ kg) in one week.  Eat a heart-healthy diet. Work with a diet and nutrition specialist (dietitian) to create an eating plan that is best for you.  Keep all follow-up visits as told by your health care provider. This is important. Where to find more information  American Heart Association: www.heart.org Summary  Follow the action plan that was created by you and your health care provider.  Get help right away if you have any symptoms in the Red zone. This information is not intended to replace advice given to you by your health care provider. Make sure you discuss any questions you have with your health care provider. Document Revised: 01/06/2017 Document Reviewed: 03/05/2016 Elsevier Patient Education  2020 [employer]. Hypoxia Hypoxia is a condition that happens when there is a lack of oxygen in the body's tissues and organs. When there is not enough oxygen, organs cannot work as they should. This causes serious problems throughout the body and in the brain. What are the causes? This condition may be caused by:  Exposure to high altitude.  A  collapsed lung (pneumothorax).  Lung infection (pneumonia).  Lung injury.  Long-term (chronic) lung disease, such as COPD (chronic obstructive pulmonary disease).  Blood collecting in the chest cavity (hemothorax).  Food, saliva, or vomit getting into the airway (aspiration).  Reduced blood flow (ischemia).  Severe blood loss.  Slow or shallow breathing (hypoventilation).  Blood disorders, such as anemia.  Carbon monoxide poisoning.  The heart suddenly stopping (cardiac arrest).  Anesthetic  medicines.  Drowning.  Choking. What are the signs or symptoms? Symptoms of this condition include:  Headache.  Fatigue.  Drowsiness.  Forgetfulness.  Nausea.  Confusion.  Shortness of breath.  Dizziness.  Bluish color of the skin, lips, or nail beds (cyanosis).  Change in consciousness or awareness. If hypoxia is not treated, it can lead to convulsions, loss of consciousness (coma), or brain damage. How is this diagnosed? This condition may be diagnosed based on:  A physical exam.  Blood tests.  A test that measures how much oxygen is in your blood (pulse oximetry). This is done with a sensor that is placed on your finger, toe, or earlobe.  Chest X-ray.  Tests to check your lung function (pulmonary function tests).  A test to check the electrical activity of your heart (electrocardiogram, ECG). You may have other tests to determine the cause of your hypoxia. How is this treated?  Treatment for this condition depends on what is causing the hypoxia. You will likely be treated with oxygen therapy. This may be done by giving you oxygen through a face mask or through tubes in your nose. Your health care provider may also recommend other therapies to treat the underlying cause of your hypoxia. Follow these instructions at home:  Take over-the-counter and prescription medicines only as told by your health care provider.  Do not use any products that contain nicotine or tobacco, such as cigarettes and e-cigarettes. If you need help quitting, ask your health care provider.  Avoid secondhand smoke.  Work with your health care provider to manage any chronic conditions you have that may be causing hypoxia, such as COPD.  Keep all follow-up visits as told by your health care provider. This is important. Contact a health care provider if:  You have a fever.  You have trouble breathing, even after treatment.  You become extremely short of breath when you  exercise. Get help right away if:  Your shortness of breath gets worse, especially with normal or very little activity.  Your skin, lips, or nail beds have a bluish color.  You become confused or you cannot think properly.  You have chest pain. Summary  Hypoxia is a condition that happens when there is a lack of oxygen in the body's tissues and organs.  If hypoxia is not treated, it can lead to convulsions, loss of consciousness (coma), or brain damage.  Symptoms of hypoxia can include a headache, shortness of breath, confusion, nausea, and a bluish skin color.  Hypoxia has many possible causes, including exposure to high altitude, carbon monoxide poisoning, or other health issues, such as blood disorders or cardiac arrest.  Hypoxia is usually treated with oxygen therapy. This information is not intended to replace advice given to you by your health care provider. Make sure you discuss any questions you have with your health care provider. Document Revised: 01/06/2017 Document Reviewed: 03/14/2016 Elsevier Patient Education  2020 [employer]. Cor Pulmonale Cor pulmonale is enlargement and weakening of the right side of the heart. It is caused by increased blood  pressure in the lungs (pulmonary hypertension). Blood returning to the heart from the body comes into the right pumping chamber of the heart (right ventricle). The right ventricle pumps blood into the lungs. If you have a lung condition that restricts blood flow through your lungs, blood can back up in your right ventricle. This forces the right ventricle to work harder to pump blood. This can cause the right side of your heart to enlarge. Over time, cor pulmonale can lead to right ventricular failure. There are two types of cor pulmonale:  Acute cor pulmonale. This can happen suddenly. The most common cause is a blood clot that forms in a vein and travels to the lung (pulmonary embolism).  Chronic cor pulmonale. This can  develop over time. The most common cause of chronic cor pulmonale is chronic obstructive lung disease (COPD). Any lung disease that restricts blood flow can lead to cor pulmonale. Your health care provider will do tests to find the cause of your condition. Treatment will depend on the cause. Follow these instructions at home: Medicines  Take over-the-counter and prescription medicines only as told by your health care provider.  Use portable oxygen as told by your health care provider.  If you are taking blood thinners: ? Talk with your health care provider before you take any medicines that contain aspirin or NSAIDs. These medicines increase your risk for dangerous bleeding. ? Take your medicine exactly as told, at the same time every day. ? Avoid activities that could cause injury or bruising, and follow instructions about how to prevent falls. ? Wear a medical alert bracelet or carry a card that lists what medicines you take. Activity  Do not lift anything that is heavier than 10 lb (4.5 kg), or the limit that you are told, until your health care provider says that it is safe.  Return to your normal activities as told by your health care provider. Ask your health care provider what activities are safe for you. General instructions      Do not use any products that contain nicotine or tobacco, such as cigarettes, e-cigarettes, and chewing tobacco. If you need help quitting, ask your health care provider.  Weigh yourself daily and write down your weight. This will let you know when you are starting to retain fluid in your body.  Keep all follow-up visits as told by your health care provider. This is important. Contact a health care provider if you have:  Shortness of breath.  Fatigue.  Cough.  Any bleeding.  Swelling of your neck veins.  Swelling of your legs. This may be a sign of fluid buildup in your legs (peripheral edema).  A lot of weight gain in a short time. Get  help right away if you:  Have difficulty breathing.  Have chest pain.  Cough up blood. These symptoms may represent a serious problem that is an emergency. Do not wait to see if the symptoms will go away. Get medical help right away. Call your local emergency services (911 in the [place] Do not drive yourself to the hospital. Summary  Cor pulmonale is enlargement and weakening of the right side of your heart from increased blood pressure in your lungs (pulmonary hypertension).  Cor pulmonale can happen suddenly (acute cor pulmonale) or develop slowly over time (chronic cor pulmonale). The most common cause of acute cor pulmonale is a pulmonary embolism. COPD is the most common cause of chronic cor pulmonale.  Treatment of cor pulmonale depends on the  cause. Treatment may include oxygen for breathing support or certain medicines.  Contact a health care provider if you have shortness of breath, fatigue, cough, any bleeding, rapid weight gain, or swelling of your neck veins or legs or both.  Get help right away if you have difficulty breathing or chest pain, or if you are coughing up blood. This information is not intended to replace advice given to you by your health care provider. Make sure you discuss any questions you have with your health care provider. Document Revised: 02/14/2018 Document Reviewed: 02/02/2018 Elsevier Patient Education  2020 [employer]. Pulmonary Hypertension Pulmonary hypertension is a long-term (chronic) condition in which there is high blood pressure in the arteries in the lungs (pulmonary arteries). This condition occurs when pulmonary arteries become narrow and tight, making it harder for blood to flow through the lungs. This in turn makes the heart work harder to pump blood through the lungs, making it harder for you to breathe. Over time, pulmonary hypertension can weaken and damage the heart muscle, specifically the right side of the heart. Pulmonary  hypertension is a serious condition that can be life-threatening. What are the causes? This condition may be caused by different medical conditions. It can be categorized by cause into five groups:  Group 1: Pulmonary hypertension that is caused by abnormal growth of small blood vessels in the lungs (pulmonary arterial hypertension). The abnormal blood vessel growth may have no known cause, or it may be: ? Passed from parent to child (hereditary). ? Caused by another disease, such as a connective tissue disease (including lupus or scleroderma), congenital heart disease, liver disease, or HIV. ? Caused by certain medicines or poisons (toxins).  Group 2: Pulmonary hypertension that is caused by weakness of the left chamber of the heart (left ventricle) or heart valve disease.  Group 3: Pulmonary hypertension that is caused by lung disease or low oxygen levels. Causes in this group include: ? Emphysema or chronic obstructive pulmonary disease (COPD). ? Untreated sleep apnea. ? Pulmonary fibrosis. ? Long-term exposure to high altitudes in certain people who may already be at higher risk for pulmonary hypertension.  Group 4: Pulmonary hypertension that is caused by blood clots in the lungs (pulmonary emboli).  Group 5: Other causes of pulmonary hypertension, such as sickle cell anemia, sarcoidosis, tumors pressing on the pulmonary arteries, and various other diseases. What are the signs or symptoms? Symptoms of this condition include:  Shortness of breath. You may notice shortness of breath with: ? Activity, such as walking. ? Minimal activity, such as getting dressed. ? No activity, like when you are sitting still.  A cough. Sometimes, bloody mucus from the lungs may be coughed up (hemoptysis).  Tiredness and fatigue.  Dizziness, lightheadedness, or fainting, especially with physical activity.  Rapid heartbeat, or feeling your heart flutter or skip a beat (palpitations).  Veins in the  neck getting larger.  Swelling of the lower legs, abdomen, or both.  Bluish color of the lips and fingertips.  Chest pain or tightness in the chest.  Abdominal pain, especially in the upper abdomen. How is this diagnosed? This condition may be diagnosed based on one or more of the following tests:  Chest X-ray.  Blood tests.  CT scan.  Pulmonary function test. This test measures how much air your lungs can hold. It also tests how well air moves in and out of your lungs.  6-minute walk test. This tests how severe your condition is in relation to your  activity levels.  Electrocardiogram (ECG). This test records the electrical impulses of the heart.  Echocardiogram. This test uses sound waves (ultrasound) to produce an image of the heart.  Cardiac catheterization. This is a procedure in which a thin tube (catheter) is passed into the pulmonary artery and used to test the pressure in your pulmonary artery and the right side of your heart.  Lung biopsy. This involves having a procedure to remove a small sample of lung tissue for testing. This may help determine an underlying cause of your pulmonary hypertension. How is this treated? There is no cure for this condition, but treatment can help to relieve symptoms and slow the progress of the condition. Treatment may include:  Cardiac rehabilitation. This is a treatment program that includes exercise training, education, and counseling to help you get stronger and return to an active lifestyle.  Oxygen therapy.  Medicines that: ? Lower blood pressure. ? Relax (dilate) the pulmonary blood vessels. ? Help the heart beat more efficiently and pump more blood. ? Help the body get rid of extra fluid (diuretics). ? Thin the blood in order to prevent blood clots in the lungs.  Lung surgery to relieve pressure on the heart, for severe cases that do not respond to medical treatment.  Heart-lung transplant, or lung transplant. This may be  done in very severe cases. Follow these instructions at home: Eating and drinking   Eat a healthy diet that includes plenty of fresh fruits and vegetables, whole grains, and beans.  Limit your salt (sodium) intake to less than 2,300 mg a day. Lifestyle  Do not use any products that contain nicotine or tobacco, such as cigarettes and e-cigarettes. If you need help quitting, ask your health care provider.  Avoid secondhand smoke. Activity  Get plenty of rest.  Exercise as directed. Talk with your health care provider about what type of exercise is safe for you.  Avoid hot tubs and saunas.  Avoid high altitudes. General instructions  Take over-the-counter and prescription medicines only as told by your health care provider. Do not change or stop medicines without checking with your health care provider.  Stay up to date on your vaccines, especially yearly flu (influenza) and pneumonia vaccines.  If you are a woman of child-bearing age, avoid becoming pregnant. Talk with your health care provider about birth control.  Consider ways to get support for anxiety and stress of living with pulmonary hypertension. Talk with your health care provider about support groups and online resources.  Use oxygen therapy at home as directed.  Keep track of your weight. Weight gain could be a sign that your condition is getting worse.  Keep all follow-up visits as told by your health care provider. This is important. Contact a health care provider if:  Your cough gets worse.  You have more shortness of breath than usual, or you start to have trouble doing activities that you could do before.  You need to use medicines or oxygen more frequently or in higher dosages than usual. Get help right away if:  You have severe shortness of breath.  You have chest pain or pressure.  You cough up blood.  You have swelling of your feet or legs that gets worse.  You have rapid weight gain over a  period of 1-2 days.  Your medicines or oxygen do not provide relief. Summary  Pulmonary hypertension is a chronic condition in which there is high blood pressure in the arteries in the lungs (pulmonary  arteries).  Pulmonary hypertension is a serious condition that can be life-threatening. It can be caused by a variety of illnesses.  Treatment may involve taking medicines and using oxygen therapy. Severe cases may require surgery or a transplant. This information is not intended to replace advice given to you by your health care provider. Make sure you discuss any questions you have with your health care provider. Document Revised: 01/06/2017 Document Reviewed: 04/19/2016 Elsevier Patient Education  [place] on my medicine - ELIQUIS (apixaban)  Why was Eliquis prescribed for you? Eliquis was prescribed to treat blood clots that may have been found in the veins of your legs (deep vein thrombosis) or in your lungs (pulmonary embolism) and to reduce the risk of them occurring again.  What do You need to know about Eliquis ? The dose is ONE 5 mg tablet taken TWICE daily.  Eliquis may be taken with or without food.   Try to take the dose about the same time in the morning and in the evening. If you have difficulty swallowing the tablet whole please discuss with your pharmacist how to take the medication safely.  Take Eliquis exactly as prescribed and DO NOT stop taking Eliquis without talking to the doctor who prescribed the medication.  Stopping may increase your risk of developing a new blood clot.  Refill your prescription before you run out.  After discharge, you should have regular check-up appointments with your healthcare provider that is prescribing your Eliquis.    What do you do if you miss a dose? If a dose of ELIQUIS is not taken at the scheduled time, take it as soon as possible on the same day and twice-daily administration should be resumed. The  dose should not be doubled to make up for a missed dose.  Important Safety Information A possible side effect of Eliquis is bleeding. You should call your healthcare provider right away if you experience any of the following: ? Bleeding from an injury or your nose that does not stop. ? Unusual colored urine (red or dark brown) or unusual colored stools (red or black). ? Unusual bruising for unknown reasons. ? A serious fall or if you hit your head (even if there is no bleeding).  Some medicines may interact with Eliquis and might increase your risk of bleeding or clotting while on Eliquis. To help avoid this, consult your healthcare provider or pharmacist prior to using any new prescription or non-prescription medications, including herbals, vitamins, non-steroidal anti-inflammatory drugs (NSAIDs) and supplements.  This website has more information on Eliquis (apixaban): http://www.eliquis.com/eliquis/home

## 2019-09-08 ENCOUNTER — Inpatient Hospital Stay (HOSPITAL_COMMUNITY): Payer: 59

## 2019-09-08 LAB — BASIC METABOLIC PANEL
Anion gap: 12 (ref 5–15)
BUN: 30 mg/dL — ABNORMAL HIGH (ref 6–20)
CO2: 30 mmol/L (ref 22–32)
Calcium: 8.3 mg/dL — ABNORMAL LOW (ref 8.9–10.3)
Chloride: 93 mmol/L — ABNORMAL LOW (ref 98–111)
Creatinine, Ser: 0.81 mg/dL (ref 0.44–1.00)
GFR calc Af Amer: >60 mL/min (ref >60)
GFR calc non Af Amer: >60 mL/min (ref >60)
Glucose, Bld: 308 mg/dL — ABNORMAL HIGH (ref 70–99)
Potassium: 3.8 mmol/L (ref 3.5–5.1)
Sodium: 135 mmol/L (ref 135–145)

## 2019-09-08 LAB — GLUCOSE, CAPILLARY
Glucose-Capillary: 256 mg/dL — ABNORMAL HIGH (ref 70–99)
Glucose-Capillary: 279 mg/dL — ABNORMAL HIGH (ref 70–99)
Glucose-Capillary: 359 mg/dL — ABNORMAL HIGH (ref 70–99)
Glucose-Capillary: 297 mg/dL — ABNORMAL HIGH (ref 70–99)

## 2019-09-08 NOTE — Progress Notes (Signed)
   09/08/19 0926  What Happened  Was fall witnessed? Yes  Who witnessed fall? Loleta Dicker RN, Barbra Sarks NT  Patients activity before fall ambulating-assisted  Point of contact buttocks  Was patient injured? No  Follow Up  MD notified [place] Crane MD  Time MD notified [phone]  Family notified No - patient refusal  Additional tests No  Simple treatment Other (comment) (no tx needed)  Progress note created (see row info) Yes  Adult Fall Risk Assessment  Risk Factor Category (scoring not indicated) Fall has occurred during this admission (document High fall risk)  Age 49  Fall History: Fall within 6 months prior to admission 0  Elimination; Bowel and/or Urine Incontinence 0  Elimination; Bowel and/or Urine Urgency/Frequency 2  Medications: includes PCA/Opiates, Anti-convulsants, Anti-hypertensives, Diuretics, Hypnotics, Laxatives, Sedatives, and Psychotropics 3  Patient Care Equipment 2  Mobility-Assistance 2  Mobility-Gait 2  Mobility-Sensory Deficit 0  Altered awareness of immediate physical environment 0  Impulsiveness 0  Lack of understanding of one's physical/cognitive limitations 0  Total Score 11  Patient Fall Risk Level High fall risk  Adult Fall Risk Interventions  Required Bundle Interventions *See Row Information* High fall risk - low, moderate, and high requirements implemented  Additional Interventions Room near nurses station;Use of appropriate toileting equipment (bedpan, BSC, etc.);Pharmacy review of medications;PT/OT need assessed if change in mobility from baseline  Screening for Fall Injury Risk (To be completed on HIGH fall risk patients) - Assessing Need for Low Bed  Risk For Fall Injury- Low Bed Criteria None identified - Continue screening  Screening for Fall Injury Risk (To be completed on HIGH fall risk patients who do not meet crieteria for Low Bed) - Assessing Need for Floor Mats Only  Risk For Fall Injury- Criteria for Floor Mats None identified - No  additional interventions needed  Vitals  Temp (!) 97.4 F (36.3 C)  Temp Source Oral  BP (!) 120/88  MAP (mmHg) 97  BP Location Right Arm  BP Method Automatic  Patient Position (if appropriate) Lying  Pulse Rate (!) 108  Pulse Rate Source Monitor  ECG Heart Rate (!) 107  Resp (!) 30  Oxygen Therapy  SpO2 92 %  O2 Device HFNC  Heater temperature 87.6 F (30.9 C)  O2 Flow Rate (L/min) 30 L/min  FiO2 (%) 90 %  Pain Assessment  Pain Scale 0-10  Pain Score 0  PCA/Epidural/Spinal Assessment  Respiratory Pattern Regular;Unlabored  Neurological  Neuro (WDL) WDL  Level of Consciousness Alert  Orientation Level Oriented X4  Cognition Appropriate at baseline;Appropriate attention/concentration;Appropriate judgement;Follows commands;Appropriate safety awareness;Appropriate for developmental age  Speech Clear;Appropriate for developmental age;Appropriate at baseline  Pupil Assessment  No  RUE Motor Strength 5  LUE Motor Strength 5  RLE Motor Strength 4  LLE Motor Strength 4  Glasgow Coma Scale  Eye Opening 4  Best Verbal Response (NON-intubated) 5  Best Motor Response 6  Glasgow Coma Scale Score 15  Musculoskeletal  Musculoskeletal (WDL) X  Assistive Device MaxiMove  Generalized Weakness Yes  Weight Bearing Restrictions No  Integumentary  Integumentary (WDL) X  Skin Color Appropriate for ethnicity  Skin Condition Dry  Skin Integrity Intact  Skin Turgor Non-tenting

## 2019-09-08 NOTE — Progress Notes (Signed)
PROGRESS NOTE  Felicia [name] CVE:[id number] DOB: 04-05-1970 DOA: 09/01/2019 PCP: Aretta Nip, MD  HPI/Recap of past 24 hours: Patient was admitted to the hospital with working diagnosis of acute on chronic respiratory failure, due to interstitial lung disease, NSIP flare/ right heart failure/ pulmonary hypertension/core pulmonale.  49 year old female with past medical history for nonspecific incisional pneumonitis, type 2 diabetes mellitus, pulmonary hypertension, right heart failure, DVT and chronic hypoxic respiratory failure 10 to 15 L supplemental oxygen. She presented with worsening dyspnea, associated with cough with worsening orthopnea.On her initial physical examination she was in respiratory distress, respiratory rate 40 breaths/min, heart rate 87, blood pressure 105/92, temperature 98.5, oxygen saturation 93% on 25 L high flow nasal cannula.Her lungs had rales bilaterally, heart S1-S2, present rhythm, soft abdomen, no lower extremity edema. Arterial pH 7.49, PCO2 44.6, PO2 99, bicarb 36, sodium 138, potassium 3.3, chloride 90, bicarb 27, glucose 455, BUN 28, creatinine 1.0, troponin I 206-193. BNP 1982, white count 13.8, hemoglobin 13.8, hematocrit 47.6, platelets 249.SARS COVID-19 negative. Urinalysis 21-50 red cells, more than 50 white cells, specific gravity 1.012. Chest radiograph with bilateral interstitial infiltrates.EKG 89 bpm, rightward axis, prolonged QTC 500, sinus rhythm, no ST segment or T wave changes.  She has been placed on aggressive diuresis and high dose systemic steroids.  Patient continue to have significant dyspnea at rest, has been on non rebreather 15 L/ min plus heated high flow nasal cannula 30 L/ min.   Consultation wit [facility] over the phone, patient is very high risk for transfer, severe hypoxemic respiratory failure due to combination of ILD and severe pulmonary hypertension. Not clear if vasodilator therapy will make major  difference, and positive pressure ventilation likely with trigger a rapid hemodynamic decompensation.  Echocardiogram with worsening RV systolic failure, CT chest with stable ILD pattern.   Likely hypoxic respiratory failure predominately due to worsening severe pulmonary hypertension with RV failure. Looks terminal disease with no major therapeutic options. Transfer to [facility] may required positive pressure ventilation than can trigger rapid hemodynamic decompensation. Unclear if IV vasodilator therapy will help at this point in the course of her disease. The prognosis is very poor.    Palliative care has been consulted.  09/08/19: ON HFNC and NRB, appears comfortable.  Denies chest pain.  Reports b/l equal weakness in LE bilaterally.  Assisted fall earlier this AM.   Assessment/Plan: Principal Problem:   Acute on chronic respiratory failure with hypoxia (HCC) Active Problems:   Acute on chronic diastolic CHF (congestive heart failure) (HCC)   Interstitial lung disease (HCC)   Hypokalemia   History of DVT (deep vein thrombosis)   Prolonged Q-T interval on ECG   Uncontrolled diabetes mellitus with hyperglycemia (HCC)   Elevated troponin   Pressure injury of skin   Palliative care by specialist  1. Acute on chronic hypoxic respiratory failure/ ILD NSIP/acute on chronicright heart failure with acute on chronic core pulmonale/severepulmonary hypertension. 2019 DLCO 17,  HRCT 2021 with progressive fibrotic ILD,  Right heart cath 04/2019 PA pressure 60/24 with mean 39.  Follow up echocardiogram with severe and worsening reduction in RV systolic function, with severe elevated pulmonary artery systolic pressure, estimated 73 mmHg.  CT chest fibrosis pattern unchanged.  Patient with persistent dyspnea, worse with minimal efforts. Her oxygenation has been 96% on 100% fi02 and 30 L/min per HFNC.   Will decrease Fi02 to 90% to target oxygen saturation more than 88%, to prevent  further toxicity/ lung injury  from high Fi02.  Medical therapy with: Methylprednisolone 60 bid Furosemide 60 bid,  Sildenafil Verapamil  Mycophenolate.  I have talk to her about high risk for rehospitalization and progressive worsening pulmonary hypertension.   Plan wean off Fi02 at least dow to 60 % before discharge home.   2. Resolved Hypokalemia/ hyponatremia/ hypomagnesemia, diuretic induced. Renal function stable with serum cr at 0,85 with K at 4,4 and serum bicarbonate at 24. Mg is 2,2,  Continue diuresis with furosemide as tolerated.   3. DVT.Onapixaban for anticoagulation.   4. Prolonged QTc.On close telemetry monitoring,continue toavoid qtc prolonging agents.  On bedside telemetry monitoring  5. Obesity. Unspecified BMI,.   6.UncontrolledT2DMwith Hgb A1c 8,8.This am fasting glucose was317. Capillary 224, 349, 297. Repeat A1C  Continue with glucose cover and monitoring with insulin sliding scale, will increase basal insulin to 30 units at night.    7. Stage 2 pressure ulcer at the buttocks.Present on admission,Continue with local wound care.  Patient continue to be at high risk for worsening hypoxic respiratory failure   Status is: Inpatient  Remains inpatient appropriate because:IV treatments appropriate due to intensity of illness or inability to take PO   Dispo: The patient is from: Home  Anticipated d/c is to: Home  Anticipated d/c date is: 09/12/19  Patient currently is not medically stable to d/c. Plan to reach at least Fi02 60% before discharge home. She uses at home 2 oxygen concentrators and gets 20 L/min per [place].     DVT prophylaxis:      apixaban   Code Status:              full  Family Communication:  None at bedside.   Nutrition Status:     Skin Documentation: Pressure Injury 09/04/19 Buttocks Left Stage 2 -  Partial thickness loss of dermis presenting as a  shallow open injury with a red, pink wound bed without slough. small circular size of pencil (Active)  09/04/19 0200  Location: Buttocks  Location Orientation: Left  Staging: Stage 2 -  Partial thickness loss of dermis presenting as a shallow open injury with a red, pink wound bed without slough.  Wound Description (Comments): small circular size of pencil  Present on Admission: Yes     Consultants:   Pulmonary      Objective: Vitals:   09/08/19 0800 09/08/19 0839 09/08/19 0926 09/08/19 1240  BP: (!) 108/87 (!) 108/87 (!) 120/88 (!) 114/61  Pulse: (!) 115 (!) 115 (!) 108 78  Resp: 20 20 (!) 30 22  Temp: 98 F (36.7 C) 98 F (36.7 C) (!) 97.4 F (36.3 C) 97.7 F (36.5 C)  TempSrc: Oral  Oral Oral  SpO2: 92% 93% 92% 100%  Weight:        Intake/Output Summary (Last 24 hours) at 09/08/2019 1346 Last data filed at 09/08/2019 1100 Gross per 24 hour  Intake 240 ml  Output 1900 ml  Net -1660 ml   Filed Weights   09/04/19 0422 09/06/19 0417 09/07/19 0500  Weight: 87.8 kg 87.6 kg 86.9 kg    Exam:  . General: 49 y.o. year-old female well developed well nourished in no acute distress.  Alert and oriented x3. On HFNC and NRB. [name] Cardiovascular: Regular rate and rhythm with no rubs or gallops.  No thyromegaly or JVD noted.   [name] Respiratory: Diffused rales bilaterally. No wheezing noted. . Abdomen: Soft nontender nondistended with normal bowel sounds. . Musculoskeletal: Trace lower extremity edema bilaterally . Psychiatry: Mood is appropriate for condition and  setting   Data Reviewed: CBC: Recent Labs  Lab 08/29/2019 2141 09/04/2019 2224 09/04/19 0354 09/05/19 0044  WBC 13.8*  --  9.9 13.4*  HGB 13.8 16.0* 12.5 12.6  HCT 47.6* 47.0* 43.5 42.6  MCV 86.5  --  84.1 83.0  PLT 259  --  237 945   Basic Metabolic Panel: Recent Labs  Lab 08/23/2019 2141 09/02/2019 2224 09/05/19 0044 09/05/19 1701 09/06/19 0437 09/07/19 0200 09/08/19 0247  NA 138   < > 139 139 138 132* 135    K 3.3*   < > 2.9* 3.6 4.4 4.4 3.8  CL 90*   < > 93* 94* 96* 94* 93*  CO2 27   < > 32 _0 GLUCOSE 455*   < > 228* 217* 188* 317* 308*  BUN 28*   < > 34* 38* 36* 36* 30*  CREATININE 1.09*   < > 1.11* 1.24* 0.99 0.85 0.81  CALCIUM 9.4   < > 8.6* 8.5* 8.4* 8.3* 8.3*  MG 2.0  --  1.8  --  2.2 2.2  --   PHOS 4.5  --   --   --   --   --   --    < > = values in this interval not displayed.   GFR: Estimated Creatinine Clearance: 91.5 mL/min (by C-G formula based on SCr of 0.81 mg/dL). Liver Function Tests: Recent Labs  Lab 08/16/2019 2141  AST 25  ALT 29  ALKPHOS 80  BILITOT 1.6*  PROT 5.6*  ALBUMIN 3.5   No results for input(s): LIPASE, AMYLASE in the last 168 hours. No results for input(s): AMMONIA in the last 168 hours. Coagulation Profile: Recent Labs  Lab 08/27/2019 2141  INR 1.5*   Cardiac Enzymes: No results for input(s): CKTOTAL, CKMB, CKMBINDEX, TROPONINI in the last 168 hours. BNP (last 3 results) No results for input(s): PROBNP in the last 8760 hours. HbA1C: No results for input(s): HGBA1C in the last 72 hours. CBG: Recent Labs  Lab 09/07/19 0618 09/07/19 1753 09/07/19 2030 09/08/19 0614 09/08/19 1101  GLUCAP 297* 341* 423* 256* 279*   Lipid Profile: No results for input(s): CHOL, HDL, LDLCALC, TRIG, CHOLHDL, LDLDIRECT in the last 72 hours. Thyroid Function Tests: No results for input(s): TSH, T4TOTAL, FREET4, T3FREE, THYROIDAB in the last 72 hours. Anemia Panel: No results for input(s): VITAMINB12, FOLATE, FERRITIN, TIBC, IRON, RETICCTPCT in the last 72 hours. Urine analysis:    Component Value Date/Time   COLORURINE AMBER (A) 09/04/2019 0744   APPEARANCEUR CLOUDY (A) 09/04/2019 0744   LABSPEC 1.012 09/04/2019 0744   PHURINE 6.0 09/04/2019 0744   GLUCOSEU >=500 (A) 09/04/2019 0744   HGBUR SMALL (A) 09/04/2019 0744   BILIRUBINUR NEGATIVE 09/04/2019 0744   KETONESUR NEGATIVE 09/04/2019 0744   PROTEINUR 30 (A) 09/04/2019 0744   UROBILINOGEN 0.2  12/24/2008 1604   NITRITE POSITIVE (A) 09/04/2019 0744   LEUKOCYTESUR LARGE (A) 09/04/2019 0744   Sepsis Labs: _1 (procalcitonin:4,lacticidven:4)  ) Recent Results (from the past 240 hour(s))  SARS Coronavirus 2 by RT PCR (hospital order, performed in [facility] hospital lab) Nasopharyngeal Nasopharyngeal Swab     Status: None   Collection Time: 08/30/2019 11:01 PM   Specimen: Nasopharyngeal Swab  Result Value Ref Range Status   SARS Coronavirus 2 NEGATIVE NEGATIVE Final    Comment: (NOTE) SARS-CoV-2 target nucleic acids are NOT DETECTED.  The SARS-CoV-2 RNA is generally detectable in upper and lower respiratory specimens during the acute phase of infection.  The lowest concentration of SARS-CoV-2 viral copies this assay can detect is 250 copies / mL. A negative result does not preclude SARS-CoV-2 infection and should not be used as the sole basis for treatment or other patient management decisions.  A negative result may occur with improper specimen collection / handling, submission of specimen other than nasopharyngeal swab, presence of viral mutation(s) within the areas targeted by this assay, and inadequate number of viral copies (<250 copies / mL). A negative result must be combined with clinical observations, patient history, and epidemiological information.  Fact Sheet for Patients:   StrictlyIdeas.no  Fact Sheet for Healthcare Providers: BankingDealers.co.za  This test is not yet approved or  cleared by the [place] FDA and has been authorized for detection and/or diagnosis of SARS-CoV-2 by FDA under an Emergency Use Authorization (EUA).  This EUA will remain in effect (meaning this test can be used) for the duration of the COVID-19 declaration under Section 564(b)(1) of the Act, 21 U.S.C. section 360bbb-3(b)(1), unless the authorization is terminated or revoked sooner.  Performed at [facility],  [place] [address] [place], [place] [address]   Culture, blood (routine x 2)     Status: None (Preliminary result)   Collection Time: 09/04/19 12:47 AM   Specimen: BLOOD RIGHT HAND  Result Value Ref Range Status   Specimen Description BLOOD RIGHT HAND  Final   Special Requests   Final    BOTTLES DRAWN AEROBIC AND ANAEROBIC Blood Culture adequate volume   Culture   Final    NO GROWTH 4 DAYS Performed at [facility], [place] [address] [place], [place] [address]    Report Status PENDING  Incomplete  Culture, blood (routine x 2)     Status: None (Preliminary result)   Collection Time: 09/04/19  3:53 AM   Specimen: BLOOD  Result Value Ref Range Status   Specimen Description BLOOD LEFT ARM  Final   Special Requests   Final    BOTTLES DRAWN AEROBIC AND ANAEROBIC Blood Culture adequate volume   Culture   Final    NO GROWTH 4 DAYS Performed at [facility], [place] [address] [place], [place] [address]    Report Status PENDING  Incomplete  Urine culture     Status: Abnormal   Collection Time: 09/04/19  7:44 AM   Specimen: Urine, Random  Result Value Ref Range Status   Specimen Description URINE, RANDOM  Final   Special Requests   Final    NONE Performed at [facility], [place] [address], [place], [place] [address]    Culture MULTIPLE SPECIES PRESENT, SUGGEST RECOLLECTION (A)  Final   Report Status 09/05/2019 FINAL  Final  MRSA PCR Screening     Status: None   Collection Time: 09/04/19  9:15 AM   Specimen: Nasal Mucosa; Nasopharyngeal  Result Value Ref Range Status   MRSA by PCR NEGATIVE NEGATIVE Final    Comment:        The GeneXpert MRSA Assay (FDA approved for NASAL specimens only), is one component of a comprehensive MRSA colonization surveillance program. It is not intended to diagnose MRSA infection nor to guide or monitor treatment for MRSA infections. Performed at [facility], [place] [address], [place], [place] [address]       Studies: No results  found.  Scheduled Meds: . apixaban  5 mg Oral BID  . furosemide  60 mg Intravenous Q12H  . insulin aspart  0-15 Units  Subcutaneous TID WC  . insulin aspart  0-5 Units Subcutaneous QHS  . insulin glargine  30 Units Subcutaneous QHS  . ipratropium-albuterol  3 mL Nebulization TID  . methylPREDNISolone (SOLU-MEDROL) injection  60 mg Intravenous Q12H  . mycophenolate  1,500 mg Oral BID  . Ensure Max Protein  11 oz Oral Daily  . sildenafil  20 mg Oral BID  . sodium chloride flush  3 mL Intravenous Q12H  . verapamil  120 mg Oral QHS    Continuous Infusions: . sodium chloride 10 mL/hr at 09/05/19 0253     LOS: 5 days     Kayleen Memos, MD [facility] Pager [phone]  If 7PM-7AM, please contact night-coverage [email or web address] Password [email or web address] 09/08/2019, 1:46 PM

## 2019-09-08 DEATH — deceased

## 2019-09-09 ENCOUNTER — Inpatient Hospital Stay: Payer: Self-pay

## 2019-09-09 DIAGNOSIS — I50812 Chronic right heart failure: Secondary | ICD-10-CM

## 2019-09-09 DIAGNOSIS — I272 Pulmonary hypertension, unspecified: Secondary | ICD-10-CM

## 2019-09-09 LAB — GLUCOSE, CAPILLARY
Glucose-Capillary: 219 mg/dL — ABNORMAL HIGH (ref 70–99)
Glucose-Capillary: 243 mg/dL — ABNORMAL HIGH (ref 70–99)
Glucose-Capillary: 223 mg/dL — ABNORMAL HIGH (ref 70–99)
Glucose-Capillary: 170 mg/dL — ABNORMAL HIGH (ref 70–99)

## 2019-09-09 LAB — COMPREHENSIVE METABOLIC PANEL
ALT: 56 U/L — ABNORMAL HIGH (ref 0–44)
AST: 23 U/L (ref 15–41)
Albumin: 3.6 g/dL (ref 3.5–5.0)
Alkaline Phosphatase: 196 U/L — ABNORMAL HIGH (ref 38–126)
Anion gap: 11 (ref 5–15)
BUN: 25 mg/dL — ABNORMAL HIGH (ref 6–20)
CO2: 39 mmol/L — ABNORMAL HIGH (ref 22–32)
Calcium: 8.6 mg/dL — ABNORMAL LOW (ref 8.9–10.3)
Chloride: 88 mmol/L — ABNORMAL LOW (ref 98–111)
Creatinine, Ser: 0.69 mg/dL (ref 0.44–1.00)
GFR calc Af Amer: >60 mL/min (ref >60)
GFR calc non Af Amer: >60 mL/min (ref >60)
Glucose, Bld: 203 mg/dL — ABNORMAL HIGH (ref 70–99)
Potassium: 3.2 mmol/L — ABNORMAL LOW (ref 3.5–5.1)
Sodium: 138 mmol/L (ref 135–145)
Total Bilirubin: 1.2 mg/dL (ref 0.3–1.2)
Total Protein: 5.9 g/dL — ABNORMAL LOW (ref 6.5–8.1)

## 2019-09-09 LAB — TROPONIN I (HIGH SENSITIVITY): Troponin I (High Sensitivity): 64 ng/L — ABNORMAL HIGH (ref <18)

## 2019-09-09 LAB — CBC WITH DIFFERENTIAL/PLATELET
Abs Immature Granulocytes: 0.31 10*3/uL — ABNORMAL HIGH (ref 0.00–0.07)
Basophils Absolute: 0 10*3/uL (ref 0.0–0.1)
Basophils Relative: 0 %
Eosinophils Absolute: 0 10*3/uL (ref 0.0–0.5)
Eosinophils Relative: 0 %
HCT: 43.6 % (ref 36.0–46.0)
Hemoglobin: 12.7 g/dL (ref 12.0–15.0)
Immature Granulocytes: 2 %
Lymphocytes Relative: 1 %
Lymphs Abs: 0.2 10*3/uL — ABNORMAL LOW (ref 0.7–4.0)
MCH: 24.3 pg — ABNORMAL LOW (ref 26.0–34.0)
MCHC: 29.1 g/dL — ABNORMAL LOW (ref 30.0–36.0)
MCV: 83.4 fL (ref 80.0–100.0)
Monocytes Absolute: 0.7 10*3/uL (ref 0.1–1.0)
Monocytes Relative: 5 %
Neutro Abs: 12.9 10*3/uL — ABNORMAL HIGH (ref 1.7–7.7)
Neutrophils Relative %: 92 %
Platelets: 178 10*3/uL (ref 150–400)
RBC: 5.23 MIL/uL — ABNORMAL HIGH (ref 3.87–5.11)
RDW: 16.3 % — ABNORMAL HIGH (ref 11.5–15.5)
WBC: 14.2 10*3/uL — ABNORMAL HIGH (ref 4.0–10.5)
nRBC: 0.2 % (ref 0.0–0.2)

## 2019-09-09 LAB — CULTURE, BLOOD (ROUTINE X 2)
Culture: NO GROWTH
Culture: NO GROWTH
Special Requests: ADEQUATE
Special Requests: ADEQUATE

## 2019-09-09 LAB — MAGNESIUM: Magnesium: 2 mg/dL (ref 1.7–2.4)

## 2019-09-09 LAB — BRAIN NATRIURETIC PEPTIDE: B Natriuretic Peptide: 544.6 pg/mL — ABNORMAL HIGH (ref 0.0–100.0)

## 2019-09-09 LAB — PHOSPHORUS: Phosphorus: 2.9 mg/dL (ref 2.5–4.6)

## 2019-09-09 MED ORDER — MORPHINE SULFATE (PF) 2 MG/ML IV SOLN
1.0000 mg | INTRAVENOUS | Status: DC | PRN
  Administered 2019-09-09 – 2019-09-19 (×10): 1 mg via INTRAVENOUS
  Filled 2019-09-09 (×10): qty 1

## 2019-09-09 MED ORDER — POTASSIUM CHLORIDE CRYS ER 20 MEQ PO TBCR
40.0000 meq | EXTENDED_RELEASE_TABLET | Freq: Two times a day (BID) | ORAL | Status: AC
  Administered 2019-09-09: 40 meq via ORAL
  Filled 2019-09-09: qty 2

## 2019-09-09 MED ORDER — VERAPAMIL HCL ER 120 MG PO TBCR: 120.0000 mg | EXTENDED_RELEASE_TABLET | Freq: Every day | ORAL | Status: DC

## 2019-09-09 MED ORDER — ESCITALOPRAM OXALATE 10 MG PO TABS
10.0000 mg | ORAL_TABLET | Freq: Every day | ORAL | Status: DC
  Administered 2019-09-09 – 2019-09-29 (×20): 10 mg via ORAL
  Filled 2019-09-09 (×21): qty 1

## 2019-09-09 MED ORDER — FUROSEMIDE 10 MG/ML IJ SOLN
20.0000 mg | Freq: Four times a day (QID) | INTRAMUSCULAR | Status: DC
  Administered 2019-09-09 – 2019-09-10 (×3): 20 mg via INTRAVENOUS
  Filled 2019-09-09 (×3): qty 2

## 2019-09-09 MED ORDER — ATENOLOL 25 MG PO TABS
12.5000 mg | ORAL_TABLET | Freq: Two times a day (BID) | ORAL | Status: DC
  Administered 2019-09-09 – 2019-09-12 (×6): 12.5 mg via ORAL
  Filled 2019-09-09 (×7): qty 1

## 2019-09-09 MED ORDER — POTASSIUM CHLORIDE CRYS ER 20 MEQ PO TBCR
20.0000 meq | EXTENDED_RELEASE_TABLET | Freq: Two times a day (BID) | ORAL | Status: DC
  Administered 2019-09-09: 20 meq via ORAL
  Filled 2019-09-09: qty 1

## 2019-09-09 MED ORDER — SENNOSIDES-DOCUSATE SODIUM 8.6-50 MG PO TABS
2.0000 | ORAL_TABLET | Freq: Two times a day (BID) | ORAL | Status: DC
  Administered 2019-09-09 – 2019-09-27 (×18): 2 via ORAL
  Filled 2019-09-09 (×28): qty 2

## 2019-09-09 MED ORDER — BISACODYL 10 MG RE SUPP
10.0000 mg | Freq: Once | RECTAL | Status: DC
  Filled 2019-09-09: qty 1

## 2019-09-09 MED ORDER — SULFAMETHOXAZOLE-TRIMETHOPRIM 800-160 MG PO TABS: 1.0000 | ORAL_TABLET | ORAL | Status: DC

## 2019-09-09 MED ORDER — FUROSEMIDE 10 MG/ML IJ SOLN
20.0000 mg | Freq: Four times a day (QID) | INTRAMUSCULAR | Status: DC
  Administered 2019-09-09: 20 mg via INTRAVENOUS
  Filled 2019-09-09: qty 2

## 2019-09-09 MED ORDER — INSULIN GLARGINE 100 UNIT/ML ~~LOC~~ SOLN
35.0000 [IU] | Freq: Every day | SUBCUTANEOUS | Status: DC
  Administered 2019-09-09 – 2019-09-14 (×4): 35 [IU] via SUBCUTANEOUS
  Filled 2019-09-09 (×7): qty 0.35

## 2019-09-09 MED ORDER — FUROSEMIDE 10 MG/ML IJ SOLN: 40.0000 mg | Freq: Four times a day (QID) | INTRAMUSCULAR | Status: DC

## 2019-09-09 MED ORDER — INSULIN ASPART 100 UNIT/ML ~~LOC~~ SOLN
3.0000 [IU] | Freq: Three times a day (TID) | SUBCUTANEOUS | Status: DC
  Administered 2019-09-09 – 2019-09-15 (×17): 3 [IU] via SUBCUTANEOUS

## 2019-09-09 MED ORDER — POLYETHYLENE GLYCOL 3350 17 G PO PACK
17.0000 g | PACK | Freq: Every day | ORAL | Status: DC
  Administered 2019-09-09 – 2019-09-11 (×3): 17 g via ORAL
  Filled 2019-09-09 (×3): qty 1

## 2019-09-09 NOTE — Significant Event (Signed)
Received a call from PICC team regarding placement. They are unable to place PICC today but that she is on their schedule first thing in the morning. RN relayed this to patient.

## 2019-09-09 NOTE — Evaluation (Addendum)
Occupational Therapy Evaluation Patient Details Name: Felicia [name] MRN: [id number] DOB: Jan 24, 1971 Today's Date: 09/09/2019    History of Present Illness Patient is a 49 y/o female who presents with SOB, cough, weakness. 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 PMH includes nonspecific interstitial pneumonitis, DVT, DM, pulmonary HTN, diastolic HF, chronic hypoxic respiratory failure on 12-15L/min 02 [place]   Clinical Impression   This 49 y/o female presents with the above. PTA pt performing short distance mobility in her home and completing basic ADL with mod independence, receiving assistance from her daughter for iADL tasks. Pt significantly limited today due to decreased cardiorespiratory status with minimal activity at bed level, chest pain, and overall fatigue/weakness. Pt on heated HFNC (30L, 90%) and with intermittent NRB today, with SpO2 decreasing to 74% with bed level activity, max RR noted 41 (Spo2 returning to >90% end of session at rest). Pt tearful given overall status and declined attempts to sit EOB with therapy. She currently requires up to [facility] for grooming/UB ADL. Pt to benefit from continued acute OT therapy services, am hopeful she will progress to point of being able to return home with assistance, however pending progress may need to consider post acute rehab. Will follow.     Follow Up Recommendations  Home health OT;Supervision/Assistance - 24 hour;Other (comment) (pending progress, may need post acute rehab)    Equipment Recommendations  3 in 1 bedside commode           Precautions / Restrictions Precautions Precautions: Other (comment);Fall Precaution Comments: watch 02, on 30L HF, hx of fall this admission, nonrebreather used as needed by pt Restrictions Weight Bearing Restrictions: No      Mobility Bed Mobility                General bed mobility comments: Deferred mobility due to pt reporting chest pain, 02 dropping to 74% on 30L HF [place] and pt not feeling well.  Transfers                 General transfer comment: Deferred mobility due to pt reporting chest pain, 02 dropping to 74% on 30L HF [place] and pt not feeling well.        ADL either performed or assessed with clinical judgement   ADL Overall ADL's : Needs assistance/impaired Eating/Feeding: Modified independent;Sitting   Grooming: Set up;Supervision/safety;Sitting;Bed level Grooming Details (indicate cue type and reason): sitting up in bed  Upper Body Bathing: Minimal assistance;Sitting   Lower Body Bathing: Total assistance;Maximal assistance;Bed level   Upper Body Dressing : Minimal assistance;Bed level;Sitting   Lower Body Dressing: Maximal assistance;Total assistance;Bed level       Toileting- Clothing Manipulation and Hygiene: Total assistance;Bed level         General ADL Comments: limited/bed level eval completed only as pt with chest pain, tearful and with decreased O2 sats even with general MMT at bed level                         Pertinent Vitals/Pain Pain Assessment: Faces Faces Pain Scale: Hurts even more Pain Location: chest Pain Descriptors / Indicators: Crying Pain Intervention(s): Monitored during session;Limited activity within patient's tolerance;Repositioned     Hand Dominance Left   Extremity/Trunk Assessment Upper Extremity Assessment Upper Extremity Assessment: Generalized weakness;RUE deficits/detail RUE Deficits / Details: ROM WFL but noted increased effort (  stiffness) in RUE with shoulder ROM compared to LUE    Lower Extremity Assessment Lower Extremity Assessment: Defer to PT evaluation       Communication Communication Communication: No difficulties   Cognition Arousal/Alertness: Awake/alert Behavior During Therapy: WFL for tasks assessed/performed (tearful) Overall  Cognitive Status: Within Functional Limits for tasks assessed                                 General Comments: Pt crying and emotional during session; reporting chest pain.   General Comments  Sp02 ranged from 74-94% on 30L HF [place], RR up to 41 at rest in bed. SpO2 returned to >90% end of session     Exercises     Shoulder Instructions      Home Living Family/patient expects to be discharged to:: Private residence Living Arrangements: Children Available Help at Discharge: Family;Available PRN/intermittently Type of Home: House Home Access: Stairs to enter [employer] of Steps: 4 Entrance Stairs-Rails: Left Home Layout: Able to live on main level with bedroom/bathroom     Bathroom Shower/Tub: Occupational psychologist: Standard     Home Equipment: Clinical cytogeneticist - 4 wheels;Transport chair;Toilet riser;Other (comment)   Additional Comments: on 12- 15L O2 baseline; looking into electric wc       Prior Functioning/Environment Level of Independence: Needs assistance  Gait / Transfers Assistance Needed: uses rollator for mobility within home, walking short distances with assist. Getting HHPT PTA. ADL's / Homemaking Assistance Needed: able to complete ADLs with increased time, daughter assists with IADLs    Comments: Daugher works on weekends.        OT Problem List: Decreased strength;Decreased range of motion;Decreased activity tolerance;Impaired balance (sitting and/or standing);Cardiopulmonary status limiting activity;Decreased knowledge of use of DME or AE;Decreased knowledge of precautions;Decreased safety awareness;Pain      OT Treatment/Interventions: Self-care/ADL training;Therapeutic exercise;Energy conservation;DME and/or AE instruction;Therapeutic activities;Patient/family education;Balance training    OT Goals(Current goals can be found in the care plan section) Acute Rehab OT Goals Patient Stated Goal: to be able to  breathe OT Goal Formulation: With patient Time For Goal Achievement: 09/23/19 Potential to Achieve Goals: Fair  OT Frequency: Min 2X/week   Barriers to D/C:            Co-evaluation PT/OT/SLP Co-Evaluation/Treatment: Yes Reason for Co-Treatment: Complexity of the patient's impairments (multi-system involvement);For patient/therapist safety;To address functional/ADL transfers (for activity/pulmonary tolerance and safety) PT goals addressed during session: Mobility/safety with mobility OT goals addressed during session: ADL's and self-care;Strengthening/ROM      AM-PAC OT "6 Clicks" Daily Activity     Outcome Measure Help from another person eating meals?: None Help from another person taking care of personal grooming?: A Little Help from another person toileting, which includes using toliet, bedpan, or urinal?: Total Help from another person bathing (including washing, rinsing, drying)?: A Lot Help from another person to put on and taking off regular upper body clothing?: A Lot Help from another person to put on and taking off regular lower body clothing?: Total 6 Click Score: 13   End of Session Equipment Utilized During Treatment: Oxygen Nurse Communication: Mobility status;Need for lift equipment  Activity Tolerance: Treatment limited secondary to medical complications (Comment);Patient limited by fatigue Patient left: in bed;with call bell/phone within reach;with bed alarm set  OT Visit Diagnosis: Muscle weakness (generalized) (M62.81);Other (comment) (decreased activity tolerance )  Time: 9753-0051 OT Time Calculation (min): 12 min Charges:  OT General Charges $OT Visit: 1 Visit OT Evaluation $OT Eval Moderate Complexity: 1 Mod  Lou Cal, OT Acute Rehabilitation Services Pager [phone] Office [phone]   Felicia [name] 09/09/2019, 4:39 PM

## 2019-09-09 NOTE — Progress Notes (Signed)
Inpatient Diabetes Program Recommendations  AACE/ADA: New Consensus Statement on Inpatient Glycemic Control (2015)  Target Ranges:  Prepandial:   less than 140 mg/dL      Peak postprandial:   less than 180 mg/dL (1-2 hours)      Critically ill patients:  140 - 180 mg/dL   Lab Results  Component Value Date   GLUCAP 219 (H) 09/09/2019   HGBA1C 8.8 (H) 05/31/2019    Review of Glycemic Control Results for Felicia [name], Felicia [name] (MRN [id number]) as of 09/09/2019 10:17  Ref. Range 09/08/2019 06:14 09/08/2019 11:01 09/08/2019 16:38 09/08/2019 21:37 09/09/2019 06:13  Glucose-Capillary Latest Ref Range: 70 - 99 mg/dL 256 (H) 279 (H) 359 (H) 297 (H) 219 (H)    Inpatient Diabetes Program Recommendations:     Novolog 3 units tid with meals Lantus 35 units qhs  Will continue to follow while inpatient.  Thank you, Reche Dixon, RN, BSN Diabetes Coordinator Inpatient Diabetes Program [phone] (team pager from 8a-5p)

## 2019-09-09 NOTE — Evaluation (Signed)
Physical Therapy Evaluation Patient Details Name: Felicia [name] MRN: [id number] DOB: Jun 15, 1970 Today's Date: 09/09/2019   History of Present Illness  Patient is a 49 y/o female who presents with SOB, cough, weakness. 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 PMH includes nonspecific interstitial pneumonitis, DVT, DM, pulmonary HTN, diastolic HF, chronic hypoxic respiratory failure on 12-15L/min 02 [place]  Clinical Impression  Patient presents with generalized weakness, decreased activity tolerance, impaired cardiopulmonary status and impaired mobility s/p above. Pt reports living with daughter who works on the weekend. She helps with IADLs and ADLs as needed. Pt walks assisted with rollator minimal distances at home; has been working with HHPT PTA. Today, pt emotional and crying. Reports chest pain. Rn notified. Sp02 dropped to 74% on 30L HF [place] at rest in bed. Declined sitting EOB due to above and dyspnea at rest. Difficulty talking and breathing today. Will follow acutely to maximize independence and mobility and improve overall activity tolerance while maintaining 02 saturations prior to return home.    Follow Up Recommendations Home health PT;Supervision for mobility/OOB;Supervision/Assistance - 24 hour (pending progress)    Equipment Recommendations  None recommended by PT    Recommendations for Other Services       Precautions / Restrictions Precautions Precautions: Other (comment);Fall Precaution Comments: watch 02, on 30L HF, hx of fall, nonrebreather used as needed by pt Restrictions Weight Bearing Restrictions: No      Mobility  Bed Mobility               General bed mobility comments: Deferred mobility due to pt reporting chest pain, 02 dropping to 74% on 30L HF [place] and pt not feeling well.  Transfers                  General transfer comment: Deferred mobility due to pt reporting chest pain, 02 dropping to 74% on 30L HF [place] and pt not feeling well.  Ambulation/Gait                Stairs            Wheelchair Mobility    Modified Rankin (Stroke Patients Only)       Balance                                             Pertinent Vitals/Pain Pain Assessment: Faces Faces Pain Scale: Hurts even more Pain Location: chest Pain Descriptors / Indicators: Crying Pain Intervention(s): Monitored during session;Repositioned;Limited activity within patient's tolerance    Home Living Family/patient expects to be discharged to:: Private residence Living Arrangements: Children Available Help at Discharge: Family;Available PRN/intermittently Type of Home: House Home Access: Stairs to enter Entrance Stairs-Rails: Left Entrance Stairs-Number of Steps: 4 Home Layout: Able to live on main level with bedroom/bathroom Home Equipment: Shower seat;Walker - 4 wheels;Transport chair;Toilet riser;Other (comment) Additional Comments: on 12- 15L O2 baseline; looking into electric wc     Prior Function Level of Independence: Needs assistance   Gait / Transfers Assistance Needed: uses rollator for mobility within home, walking short distances with assist. Getting HHPT PTA.  ADL's / Homemaking Assistance Needed: able to complete ADLs with increased time, daughter assists with IADLs   Comments: Daugher works on weekends.     Hand  Dominance   Dominant Hand: Left    Extremity/Trunk Assessment   Upper Extremity Assessment Upper Extremity Assessment: Defer to OT evaluation    Lower Extremity Assessment Lower Extremity Assessment: Generalized weakness (Difficulty lifting LEs off bed against gravity. Ankle AROm WFL)       Communication   Communication: No difficulties  Cognition Arousal/Alertness: Awake/alert Behavior During Therapy: WFL for tasks assessed/performed Overall  Cognitive Status: Within Functional Limits for tasks assessed                                 General Comments: Pt crying and emotional during session; reporting chest pain.      General Comments General comments (skin integrity, edema, etc.): Sp02 ranged from 74-94% on 30L HF [place], RR up to 41 at rest in bed.    Exercises     Assessment/Plan    PT Assessment Patient needs continued PT services  PT Problem List Decreased strength;Decreased mobility;Pain;Cardiopulmonary status limiting activity;Decreased activity tolerance;Decreased range of motion       PT Treatment Interventions Therapeutic activities;Gait training;Therapeutic exercise;Patient/family education;Balance training;Functional mobility training;Stair training    PT Goals (Current goals can be found in the Care Plan section)  Acute Rehab PT Goals Patient Stated Goal: to be able to breathe PT Goal Formulation: With patient Time For Goal Achievement: 09/23/19 Potential to Achieve Goals: Fair    Frequency Min 3X/week   Barriers to discharge Decreased caregiver support daughter works on weekends, trying to get an aide or some help    Co-evaluation PT/OT/SLP Co-Evaluation/Treatment: Yes Reason for Co-Treatment: Complexity of the patient's impairments (multi-system involvement);For patient/therapist safety (for activity/pulmonary tolerance and safety) PT goals addressed during session: Mobility/safety with mobility         AM-PAC PT "6 Clicks" Mobility  Outcome Measure Help needed turning from your back to your side while in a flat bed without using bedrails?: A Little Help needed moving from lying on your back to sitting on the side of a flat bed without using bedrails?: A Little Help needed moving to and from a bed to a chair (including a wheelchair)?: A Lot Help needed standing up from a chair using your arms (e.g., wheelchair or bedside chair)?: A Lot Help needed to walk in hospital room?: A  Lot Help needed climbing 3-5 steps with a railing? : Total 6 Click Score: 13    End of Session Equipment Utilized During Treatment: Oxygen (30L HF [place]) Activity Tolerance: Patient limited by pain;Other (comment) (dyspnea) Patient left: in bed;with call bell/phone within reach;with bed alarm set Nurse Communication: Mobility status;Other (comment) (02 sats) PT Visit Diagnosis: Other (comment);Muscle weakness (generalized) (M62.81) (dyspnea)    Time: 4497-5300 PT Time Calculation (min) (ACUTE ONLY): 13 min   Charges:   PT Evaluation $PT Eval Moderate Complexity: 1 Mod          Marisa Severin, PT, DPT Acute Rehabilitation Services Pager [phone] Office [phone]      Marguarite Arbour A Sabra Heck 09/09/2019, 3:53 PM

## 2019-09-09 NOTE — Progress Notes (Signed)
Name: Felicia [name] MRN: [id number] DOB: 20-Oct-1970    ADMISSION DATE:  08/24/2019 CONSULTATION DATE:  09/09/2019   REFERRING MD :  Delphia Grates, triad  CHIEF COMPLAINT: Respiratory distress, worsening hypoxia  BRIEF PATIENT DESCRIPTION: 49 year old with fibrotic NSIP and severe pulmonary hypertension and chronic hypoxic respiratory failure maintained on 10 to 15 L oxygen at home, admitted with worsening hypoxia.  She follows up at [place] with Dr. Randol Kern and Dr. [name]  SIGNIFICANT EVENTS  7/28 PCCM consult 7/30 palliative care consult. Patient remains very steadfast in hoping for ongoing pulmonary improvement  8/2 slight decrease in O2 support   STUDIES:  Echocardiogram 04/2019 normal LV function, RVSP 64 , enlarged RV, bubble study negative.  PFTs 02/2017 FVC 1.25 L 36% predicted, FEV1 1.18 L 40% predicted with DLCO of 17   HRCT 02/2019 chronic fibrotic ILD progressed from 2018 with mild focus of honeycombing  Cath 3/ 2021right atrial pressure was 7, PA pressure 60/24 with a mean of 39 and a wedge of 10 her cardiac index was low at 1.91 and output of 3.89. Her PVR is thus 7.  HISTORY OF PRESENT ILLNESS: She has a history of ILD/biopsy-proven fibrotic NSIP dating back to 2010.  Serology has been negative in 2011 except for mildly elevated aldolase and she has been maintained on immunosuppressants, CellCept and prednisone, also Rituxan 6 monthly.  Follows with [facility]. [facility]ol [facility]ern at [facility].  She has severe pulmonary hypertension, follows with Dr. [name] at [facility] and is maintained on inhaled treprostinil and sildenafil.  Right heart cath from earlier this year as listed above. She reports worsening dyspnea over the past few days, a dry cough and worsening hypoxia for which she sought attention in the ED.  Reports falls, normally ambulates with a walker.  On further questioning she states that worsening may date back a few weeks Initial evaluation showed mild leukocytosis 13.8, lactate of 5.4, BNP 1982,  she required high flow nasal cannula with 20 L oxygen and 100%.  She was given Lasix , diuresed 1 L but then developed hypotension overnight and was given a 500 cc fluid bolus.  Empirically started on antibiotics  Prior [facility] records from Dr. [name] were reviewed , and prior office visit with my partner Dr. Gala Murdoch .  She has been advised weight loss prior to being listed for lung transplant. Prior hospitalization from 05/2019 and discharge summary was reviewed, treated with diuresis and systemic steroids with some improvement  PAST MEDICAL HISTORY :   has a past medical history of Diabetes mellitus without complication ([place]), Hypertension, NSIP (nonspecific interstitial pneumonitis) ([place]), Pulmonary hypertension ([place]) (2011), Respiratory failure with hypoxia ([place]) (05/31/2019), and SVT (supraventricular tachycardia) ([place]).  has a past surgical history that includes Cesarean section; Lung biopsy; and RIGHT HEART CATH (N/A, 04/26/2019). Prior to Admission medications   Medication Sig Start Date End Date Taking? Authorizing Provider  acetaminophen (TYLENOL) 500 MG tablet Take 2,000 mg by mouth as needed for moderate pain.    Yes [provider]  albuterol (PROVENTIL HFA;VENTOLIN HFA) 108 (90 Base) MCG/ACT inhaler Inhale 1 puff into the lungs every 6 (six) hours as needed for wheezing or shortness of breath.   Yes [provider]  apixaban (ELIQUIS) 5 MG TABS tablet Take 1 tablet (5 mg total) by mouth 2 (two) times daily. 08/02/19  Yes Olalere, Adewale A, MD  atenolol (TENORMIN) 50 MG tablet Take 75 mg by mouth 2 (two) times daily.  01/08/17  Yes [provider]  calcium citrate (CALCITRATE -  DOSED IN MG ELEMENTAL CALCIUM) 950 (200 Ca) MG tablet Take 200 mg of elemental calcium by mouth daily.   Yes [provider]  cholecalciferol (VITAMIN D3) 25 MCG (1000 UT) tablet Take 1,000 Units by mouth daily.   Yes [provider]  escitalopram (LEXAPRO) 10 MG tablet Take  10 mg by mouth daily. 08/20/19  Yes [provider]  furosemide (LASIX) 20 MG tablet Take 3 tablets (60 mg total) by mouth 2 (two) times daily. 06/04/19 09/04/19 Yes Arrien, Jimmy Picket, MD  guaiFENesin (MUCINEX) 600 MG 12 hr tablet Take 600 mg by mouth daily.    Yes [provider]  ibuprofen (ADVIL) 200 MG tablet Take 400 mg by mouth every 6 (six) hours as needed for moderate pain.   Yes [provider]  insulin lispro (HUMALOG KWIKPEN) 100 UNIT/ML KwikPen Inject 0-20 Units into the skin 2 (two) times daily. Sliding scale based on sugar level   Yes [provider]  ipratropium-albuterol (DUONEB) 0.5-2.5 (3) MG/3ML SOLN Inhale 3 mLs into the lungs every 6 (six) hours as needed (sob and wheezing).  01/08/17  Yes [provider]  metFORMIN (GLUCOPHAGE-XR) 500 MG 24 hr tablet Take 1,000 mg by mouth at bedtime.  04/10/19  Yes [provider]  mycophenolate (CELLCEPT) 500 MG tablet Take 1,500 mg by mouth 2 (two) times daily.    Yes [provider]  predniSONE (DELTASONE) 10 MG tablet TAKE 4 TABLETS BY MOUTH DAILY WITH BREAKFAST Patient taking differently: Take 40 mg by mouth daily with breakfast.  08/02/19  Yes Olalere, Adewale A, MD  sildenafil (REVATIO) 20 MG tablet Take 20 mg by mouth 2 (two) times daily. 04/15/19  Yes [provider]  sulfamethoxazole-trimethoprim (BACTRIM DS) 800-160 MG tablet Take 1 tablet by mouth 3 (three) times a week. As needed for sinus infection 12/24/18  Yes [provider]  verapamil (CALAN-SR) 120 MG CR tablet Take 1 tablet (120 mg total) by mouth at bedtime. 09/23/14  Yes Etta Quill, NP  gabapentin (NEURONTIN) 300 MG capsule Take 1 capsule (300 mg total) by mouth 3 (three) times daily for 7 days. 04/28/19 05/30/19  Cherylann Ratel A, DO  insulin degludec (TRESIBA FLEXTOUCH) 100 UNIT/ML FlexTouch Pen Inject 24 Units into the skin daily.    [provider]  LANTUS SOLOSTAR 100 UNIT/ML Solostar  Pen Inject 30 Units into the skin at bedtime. 08/28/19   [provider]  potassium chloride SA (KLOR-CON) 20 MEQ tablet Take 2 tablets (40 mEq total) by mouth daily. 04/29/19 05/30/19  Cherylann Ratel A, DO   Allergies  Allergen Reactions  . Lexapro [Escitalopram] Nausea Only    FAMILY HISTORY:  family history includes Cancer in her father, maternal grandfather, maternal grandmother, paternal grandfather, and paternal grandmother; Diabetes in her father and sister; Hypertension in her father and mother; Mental retardation in her sister. SOCIAL HISTORY:  reports that she has never smoked. She has never used smokeless tobacco. She reports that she does not drink alcohol and does not use drugs.   SUBJECTIVE:   Patient feels that her breathing is improving  She is expressing desire to work with PT   VITAL SIGNS: Temp:  [97.4 F (36.3 C)-97.8 F (36.6 C)] 97.8 F (36.6 C) (08/02 0800) Pulse Rate:  [68-116] 112 (08/02 0800) Resp:  [16-39] 20 (08/02 0800) BP: (97-116)/(61-93) 116/93 (08/02 0800) SpO2:  [91 %-100 %] 98 % (08/02 0838) FiO2 (%):  [80 %-90 %] 80 % (08/02 0838) Weight:  [38  kg] 89 kg (08/02 0500)  PHYSICAL EXAMINATION: General: Chronically ill appearing obese middle aged female seated in bed working on laptop, NAD on [place] + NRB over mouth  Eyes: Anicteric sclera  HENT: NCAT pink mmm. Patent nares  Neck: Trachea midline  Lungs: Bibasilar crackles. Even respirations without accessory muscle use  CV: Tachycardic rate. s1s2 no rgm cap refill brisk  Abdomen: Soft round ndnt + bowel sounds Extremities: 2+ peripheral pulses. No obvious joint deformity  Neuro: AAOx4 following commands PERRLA      Recent Labs  Lab 09/07/19 0200 09/08/19 0247 09/09/19 0715  NA 132* 135 138  K 4.4 3.8 3.2*  CL 94* 93* 88*  CO2 24 30 39*  BUN 36* 30* 25*  CREATININE 0.85 0.81 0.69  GLUCOSE 317* 308* 203*   Recent Labs  Lab 09/04/19 0354 09/05/19 0044 09/09/19 0715  HGB 12.5  12.6 12.7  HCT 43.5 42.6 43.6  WBC 9.9 13.4* 14.2*  PLT 237 239 178   DG CHEST PORT 1 VIEW  Result Date: 09/08/2019 CLINICAL DATA:  Respiratory distress with hypoxia. EXAM: PORTABLE CHEST 1 VIEW COMPARISON:  Radiograph 09/02/2019.  CT 09/05/2019 FINDINGS: Interstitial lung disease with diffuse interstitial opacities similar to prior exam. Chronic elevation of right hemidiaphragm. No superimposed focal airspace disease. Mild cardiomegaly is unchanged. No significant pleural effusion. No pneumothorax. Stable osseous structures. IMPRESSION: 1. Stable interstitial lung disease recent imaging. No superimposed focal airspace disease. 2. Stable mild cardiomegaly. Electronically Signed   By: Keith Rake M.D.   On: 09/08/2019 17:06    ASSESSMENT / PLAN:  Acute on chronic hypoxic respiratory failure -baseline on 10 to 15 L of oxygen, has 2 concentrators at home. Fibrotic NSIP -anti-Jo positive, on immunosuppressants Severe pulmonary hypertension and cor pulmonale -on inhaled Tyvaso and sildenafil -transfer to [facility] has been discussed, unfortunately seems that pt is too high risk for transfer. For now pt will remain at [facility] -She may eventually require initiation of IV prostacyclin but this could make things worse and not always help  P - Continue diuresis  - Continue steroids - Mycophenolate  - Wean O2 as able for SpO2 > 88%  - May need repeat RHC at some point  - Unfortunately, overall prognosis is poor  - appreciate palliative care following      Eliseo Gum MSN, AGACNP-BC [place] for personal pager If no answer, [phone] 09/09/2019, 10:57 AM

## 2019-09-09 NOTE — Consult Note (Addendum)
Cardiology Consultation:   Patient ID: Felicia [name]; [id number]; 04/21/1970   Admit date: 08/22/2019 Date of Consult: 09/09/2019  Primary Care Provider: Aretta Nip, MD Primary Cardiologist: Minus Breeding, MD 03/20/2018  Patient Profile:   Felicia [name] is a 49 y.o. female with a hx of diastolic CHF, HTN, DM2, SVT, severe pulmonary HTN, ILD with non-specific pneumonitis on 10-15L HFNC, and DVT on Eliquis who is being seen today for the evaluation of CHF and worsening RV failure at the request of Dr. Nevada Crane.   History of Present Illness:   Felicia [name] is a 49yo F with a hx as stated above who presented to [facility] on 08/25/2019 with progressive SOB and cough for several days prior to admission. HPI obtained mostly through chart review given patient current respiratory status limiting her communication. Per chart review, chronic SOB worsened with non-productive cough. She denied changes to LE edema or worsening orthopnea. Since admission she has been followed by extensively by internal medicine, pulmonary as well as palliative care.   In the ED, she was saturating well on 25L/min of supplemental O2. BP low but stable at 107/81. EKG with NSR>>Qtc prolonged at 543m. CXR showed stable chronic interstitial lung disease. WBC elevated at 13.8. LA elevated at 5.4. hsT found to be 206. BNP at 1982 with a repeat at 544 today. She was treated with IV Lasix, albuterol, DuoNebs and IV SoluMedrol. COVID negative.   Primary team has consulted with [facility], felt to be high risk for transfer therefore [facility] Repeat echocardiogram 09/05/19 this admission with LV at 50-55% with septum flattening consistent with RV volume overload with an RV systolic pressure at 726.8TMHD There is moderate to severe TR   She has a known hx of acute on chronic respiratory failure, pulmonary HTN and severe autoimmune ILD with chronic oxygen dependence. She has been on sildenafil  and Tyvaso which was restarted during a recent hospitalization in 04/2019 for which [facility] was consulted. At that time she underwent a RHC which showed moderate-range pulmonary arterial hypertension. Echocardiogram with D-shaped intraventricular septum with moderately dilated and mod-severely dysfunctional RV with estimated PASP 66 mmHg, bubble study negative. She was started back on Lasix 644mPO BID. Plan was to get [facility]. She then has a virtual visit [facility] for follow up and was noted to be taking sildenafil only twice a day. She was then asked to increase to 3 times daily. She was also started on tadalafil and ambriesentan. She was also being evaluated by the lung transplant team however had not met BMI criteria therefore was not currently on the waitlist.   She was the seen in hospital consultation by Dr. BeHaroldine Laws/26/21 for the evaluation of CHF. Prior to admission, [facility] which time the care giver was unable to get the patients O2 saturations above 85%. On admission, BNP was markedly elevated, with suspect of decompensated HF in the setting of severe ILD and mild to moderate PAH. She was diuresed with IV Lasix during her hospital course with improvement in her SOB. She was insistent on going home therefore plan was to continue IV diuretics until d/c and continue on Tyvaso and other PAH medications. D/c Lasix dosing was planned for 6062mID. [facility] per [facility].   Past Medical History:  Diagnosis Date  . Diabetes mellitus without complication ([place])    type 2   . Hypertension   . NSIP (nonspecific interstitial pneumonitis) ([place])   . Pulmonary hypertension ([place]) 2011  . Respiratory failure with hypoxia ([place]) 05/31/2019  . SVT (supraventricular tachycardia) ([facility])     Past Surgical History:    Procedure Laterality Date  . CESAREAN SECTION    . LUNG BIOPSY    . RIGHT HEART CATH N/A 04/26/2019   Procedure: RIGHT HEART CATH;  Surgeon: Larey Dresser, MD;  Location: [place] CV LAB;  Service: Cardiovascular;  Laterality: N/A;     Prior to Admission medications   Medication Sig Start Date End Date Taking? Authorizing Provider  acetaminophen (TYLENOL) 500 MG tablet Take 2,000 mg by mouth as needed for moderate pain.    Yes [provider]  albuterol (PROVENTIL HFA;VENTOLIN HFA) 108 (90 Base) MCG/ACT inhaler Inhale 1 puff into the lungs every 6 (six) hours as needed for wheezing or shortness of breath.   Yes [provider]  apixaban (ELIQUIS) 5 MG TABS tablet Take 1 tablet (5 mg total) by mouth 2 (two) times daily. 08/02/19  Yes Olalere, Adewale A, MD  atenolol (TENORMIN) 50 MG tablet Take 75 mg by mouth 2 (two) times daily.  01/08/17  Yes [provider]  calcium citrate (CALCITRATE - DOSED IN MG ELEMENTAL CALCIUM) 950 (200 Ca) MG tablet Take 200 mg of elemental calcium by mouth daily.   Yes [provider]  cholecalciferol (VITAMIN D3) 25 MCG (1000 UT) tablet Take 1,000 Units by mouth daily.   Yes [provider]  escitalopram (LEXAPRO) 10 MG tablet Take 10 mg by mouth daily. 08/20/19  Yes [provider]  furosemide (LASIX) 20 MG tablet Take 3 tablets (60 mg total) by mouth 2 (two) times daily. 06/04/19 09/04/19 Yes Arrien, Jimmy Picket, MD  guaiFENesin (MUCINEX) 600 MG 12 hr tablet Take 600 mg by mouth daily.    Yes [provider]  ibuprofen (ADVIL) 200 MG tablet Take 400 mg by mouth every 6 (six) hours as needed for moderate pain.   Yes [provider]  insulin lispro (HUMALOG KWIKPEN) 100 UNIT/ML KwikPen Inject 0-20 Units into the skin 2 (two) times daily. Sliding scale based on sugar level   Yes [provider]  ipratropium-albuterol (DUONEB) 0.5-2.5 (3) MG/3ML SOLN Inhale 3 mLs into the lungs every  6 (six) hours as needed (sob and wheezing).  01/08/17  Yes [provider]  metFORMIN (GLUCOPHAGE-XR) 500 MG 24 hr tablet Take 1,000 mg by mouth at bedtime.  04/10/19  Yes [provider]  mycophenolate (CELLCEPT) 500 MG tablet Take 1,500 mg by mouth 2 (two) times daily.    Yes [provider]  predniSONE (DELTASONE) 10 MG tablet TAKE 4 TABLETS BY MOUTH DAILY WITH BREAKFAST Patient taking differently: Take 40 mg by mouth daily with breakfast.  08/02/19  Yes Olalere, Adewale A, MD  sildenafil (REVATIO) 20 MG tablet Take 20 mg by mouth 2 (two) times daily. 04/15/19  Yes [provider]  sulfamethoxazole-trimethoprim (BACTRIM DS) 800-160 MG tablet Take 1 tablet by mouth 3 (three) times a week. As needed for sinus infection 12/24/18  Yes [provider]  verapamil (CALAN-SR) 120 MG CR tablet Take 1 tablet (120 mg total) by mouth at bedtime. 09/23/14  Yes Etta Quill, NP  gabapentin (NEURONTIN) 300 MG capsule Take 1 capsule (300 mg total) by  mouth 3 (three) times daily for 7 days. 04/28/19 05/30/19  Cherylann Ratel A, DO  insulin degludec (TRESIBA FLEXTOUCH) 100 UNIT/ML FlexTouch Pen Inject 24 Units into the skin daily.    [provider]  LANTUS SOLOSTAR 100 UNIT/ML Solostar Pen Inject 30 Units into the skin at bedtime. 08/28/19   [provider]  potassium chloride SA (KLOR-CON) 20 MEQ tablet Take 2 tablets (40 mEq total) by mouth daily. 04/29/19 05/30/19  Jonnie Finner, DO    Inpatient Medications: Scheduled Meds: . apixaban  5 mg Oral BID  . atenolol  12.5 mg Oral BID  . bisacodyl  10 mg Rectal Once  . escitalopram  10 mg Oral Daily  . furosemide  20 mg Intravenous Q6H  . insulin aspart  0-15 Units Subcutaneous TID WC  . insulin aspart  0-5 Units Subcutaneous QHS  . insulin aspart  3 Units Subcutaneous TID WC  . insulin glargine  35 Units Subcutaneous QHS  . ipratropium-albuterol  3 mL Nebulization TID  . methylPREDNISolone (SOLU-MEDROL)  injection  60 mg Intravenous Q12H  . mycophenolate  1,500 mg Oral BID  . polyethylene glycol  17 g Oral Daily  . Ensure Max Protein  11 oz Oral Daily  . senna-docusate  2 tablet Oral BID  . sildenafil  20 mg Oral BID  . sodium chloride flush  3 mL Intravenous Q12H  . [START ON 09/11/2019] verapamil  120 mg Oral QHS   Continuous Infusions: . sodium chloride 10 mL/hr at 09/05/19 0253   PRN Meds: sodium chloride, acetaminophen, albuterol, simethicone, sodium chloride flush  Allergies:    Allergies  Allergen Reactions  . Lexapro [Escitalopram] Nausea Only    Social History:   Social History   Socioeconomic History  . Marital status: Single    Spouse name: Not on file  . Number of children: Not on file  . Years of education: Not on file  . Highest education level: Not on file  Occupational History  . Not on file  Tobacco Use  . Smoking status: Never Smoker  . Smokeless tobacco: Never Used  . Tobacco comment: never smoked or used tobacco products  Vaping Use  . Vaping Use: Never used  Substance and Sexual Activity  . Alcohol use: No  . Drug use: No  . Sexual activity: Not on file  Other Topics Concern  . Not on file  Social History Narrative   One daugther.  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     Social Determinants of Health   Financial Resource Strain:   . Difficulty of Paying Living Expenses:   Food Insecurity:   . Worried About Charity fundraiser in the Last Year:   . Arboriculturist in the Last Year:   Transportation Needs:   . Film/video editor (Medical):   [name] Lack of Transportation (Non-Medical):   Physical Activity:   . Days of Exercise per Week:   . Minutes of Exercise per Session:   Stress:   . Feeling of Stress :   Social Connections:   . Frequency of Communication with Friends and Family:   . Frequency of Social Gatherings with Friends and Family:   . Attends Religious Services:   . Active Member of Clubs or Organizations:   . Attends Theatre manager Meetings:   [name] Marital Status:   Intimate Partner Violence:   . Fear of Current or Ex-Partner:   . Emotionally Abused:   [name] Physically Abused:   .  Sexually Abused:     Family History:   Family History  Problem Relation Age of Onset  . Hypertension Mother   . Cancer Father   . Diabetes Father   . Hypertension Father   . Diabetes Sister   . Mental retardation Sister   . Cancer Maternal Grandmother   . Cancer Maternal Grandfather   . Cancer Paternal Grandmother   . Cancer Paternal Grandfather    Family Status:  Family Status  Relation Name Status  . Mother  Deceased  . Father  Deceased  . Sister  (Not Specified)  . MGM  Deceased  . MGF  Deceased  . PGM  Deceased  . PGF  Deceased    ROS:  Please see the history of present illness.  All other ROS reviewed and negative.     Physical Exam/Data:   Vitals:   09/09/19 1000 09/09/19 1100 09/09/19 1123 09/09/19 1235  BP: (!) 121/106  105/81 125/77  Pulse:  (!) 123 (!) 119 (!) 125  Resp: (!) 35  19   Temp:   97.8 F (36.6 C)   TempSrc:   Oral   SpO2: 98%  96%   Weight:        Intake/Output Summary (Last 24 hours) at 09/09/2019 1326 Last data filed at 09/09/2019 1124 Gross per 24 hour  Intake 120 ml  Output 1801 ml  Net -1681 ml   Filed Weights   09/06/19 0417 09/07/19 0500 09/09/19 0500  Weight: 87.6 kg 86.9 kg 89 kg   Body mass index is 32.65 kg/m.   General: Ill appearing, mild distress Neck: Negative for carotid bruits. No JVD Lungs: Crackles in upper and lower lung fields. Breathing is unlabored. Cardiovascular: RRR with S1 S2. No murmurs Abdomen: Soft, non-tender, non-distended. No obvious abdominal masses. Extremities: 1-2 edema. Radial pulses 2+ bilaterally Neuro: Alert and oriented. No focal deficits. No facial asymmetry. MAE spontaneously. Psych: Responds to questions appropriately with normal affect.    EKG:  The EKG was personally reviewed and demonstrates: 09/05/19 NSR with PVC/PACs and  prolonged qtc at 558m Telemetry:  Telemetry was personally reviewed and demonstrates: 09/09/19 ST with rates in the 90-110 range   Relevant CV Studies:  Echocardiogram 09/05/19:  1. Left ventricular ejection fraction, by estimation, is 50 to 55%. The  left ventricle has low normal function. There is the interventricular  septum is flattened in systole and diastole, consistent with right  ventricular pressure and volume overload.  2. Right ventricular systolic function is severely reduced. The right  ventricular size is severely enlarged. There is severely elevated  pulmonary artery systolic pressure. The estimated right ventricular  systolic pressure is 781.1mmHg.  3. Right atrial size was severely dilated.  4. Tricuspid valve regurgitation is moderate to severe.  5. The aortic valve is tricuspid. Aortic valve regurgitation is not  visualized.  6. The mitral valve is normal in structure. No evidence of mitral valve  regurgitation.  7. The inferior vena cava is dilated in size with <50% respiratory  variability, suggesting right atrial pressure of 15 mmHg.  8. Compared to prior echo 04/25/19, worsening RV dilatation and systolic  dysfunction   ECHO: 04/25/2019 1. The left ventricle has normal function. There is the interventricular  septum is flattened in systole and diastole, consistent with right  ventricular pressure and volume overload.  2. Right ventricular systolic function is moderately reduced. The right  ventricular size is moderately enlarged. There is severely elevated  pulmonary artery systolic pressure.  The estimated right ventricular  systolic pressure is 56.4 mmHg.  3. Right atrial size was mildly dilated.  4. Tricuspid valve regurgitation is moderate.  5. The aortic valve is grossly normal. Aortic valve regurgitation is not  visualized.  6. The inferior vena cava is normal in size with greater than 50%  respiratory variability, suggesting right atrial  pressure of 3 mmHg.  7. Agitated saline contrast bubble study was negative, with no evidence  of any interatrial shunt.   CATH: R heart cath 04/26/2019 Right Heart Pressures RHC Procedural Findings: Hemodynamics (mmHg) RA mean 7 RV 60/12 PA 60/24, mean 39 PCWP mean 10  Oxygen saturations: SVC 60% PA 61% AO 100%  Cardiac Output (Fick) 3.89  Cardiac Index (Fick) 1.91 PVR 7.45 WU    ECHO: 02/03/2017 - Left ventricle: The cavity size was normal. Systolic function was  normal. The estimated ejection fraction was in the range of 55%  to 60%. Wall motion was normal; there were no regional wall  motion abnormalities. Doppler parameters are consistent with  abnormal left ventricular relaxation (grade 1 diastolic  dysfunction).  - Ventricular septum: The contour showed diastolic flattening and  systolic flattening.  - Mitral valve: There was no regurgitation.  - Right ventricle: The cavity size was severely dilated. Wall  thickness was normal. Systolic function was moderately reduced.  - Right atrium: The atrium was moderately dilated.  - Tricuspid valve: There was moderate regurgitation.  - Pulmonic valve: There was trivial regurgitation.  - Pulmonary arteries: Systolic pressure was moderately to severely  increased. PA peak pressure: 70 mm Hg (S).  - Inferior vena cava: The vessel was dilated. The respirophasic  diameter changes were blunted (< 50%), consistent with elevated  central venous pressure.  - Pericardium, extracardiac: There was no pericardial effusion.   Laboratory Data:  Chemistry Recent Labs  Lab 09/07/19 0200 09/08/19 0247 09/09/19 0715  NA 132* 135 138  K 4.4 3.8 3.2*  CL 94* 93* 88*  CO2 24 30 39*  GLUCOSE 317* 308* 203*  BUN 36* 30* 25*  CREATININE 0.85 0.81 0.69  CALCIUM 8.3* 8.3* 8.6*  GFRNONAA >60 >60 >60  GFRAA >60 >60 >60  ANIONGAP _0 Total Protein  Date Value Ref Range Status  09/09/2019 5.9 (L) 6.5 - 8.1  g/dL Final   Albumin  Date Value Ref Range Status  09/09/2019 3.6 3.5 - 5.0 g/dL Final   AST  Date Value Ref Range Status  09/09/2019 23 15 - 41 U/L Final   ALT  Date Value Ref Range Status  09/09/2019 56 (H) 0 - 44 U/L Final   Alkaline Phosphatase  Date Value Ref Range Status  09/09/2019 196 (H) 38 - 126 U/L Final   Total Bilirubin  Date Value Ref Range Status  09/09/2019 1.2 0.3 - 1.2 mg/dL Final   Hematology Recent Labs  Lab 09/04/19 0354 09/05/19 0044 09/09/19 0715  WBC 9.9 13.4* 14.2*  RBC 5.17* 5.13* 5.23*  HGB 12.5 12.6 12.7  HCT 43.5 42.6 43.6  MCV 84.1 83.0 83.4  MCH 24.2* 24.6* 24.3*  MCHC 28.7* 29.6* 29.1*  RDW 16.1* 16.2* 16.3*  PLT 237 239 178   Cardiac EnzymesNo results for input(s): TROPONINI in the last 168 hours. No results for input(s): TROPIPOC in the last 168 hours.  BNP Recent Labs  Lab 09/02/2019 2141 09/09/19 0715  BNP 1,982.4* 544.6*    DDimer No results for input(s): DDIMER in the last 168 hours. TSH:  Lab Results  Component Value Date   TSH 3.678 09/23/2014   Lipids:No results found for: CHOL, HDL, LDLCALC, LDLDIRECT, TRIG, CHOLHDL HgbA1c: Lab Results  Component Value Date   HGBA1C 8.8 (H) 05/31/2019    Radiology/Studies:  CT CHEST WO CONTRAST  Result Date: 09/06/2019 CLINICAL DATA:  Interstitial lung disease. EXAM: CT CHEST WITHOUT CONTRAST TECHNIQUE: Multidetector CT imaging of the chest was performed following the standard protocol without IV contrast. COMPARISON:  02/25/2019 02/04/2019, 03/27/2018, 02/03/2017. FINDINGS: Cardiovascular: Pulmonic trunk and heart are enlarged. No pericardial effusion. Mediastinum/Nodes: No pathologically enlarged mediastinal or axillary lymph nodes. Hilar regions are difficult to evaluate without IV contrast. Esophagus is grossly unremarkable. Lungs/Pleura: Patchy pulmonary parenchymal ground-glass, interstitial coarsening and traction bronchiectasis/bronchiolectasis, basilar predominant and  unchanged from 02/03/2017. No pleural fluid. Airway is unremarkable. Upper Abdomen: Image quality in the upper abdomen is degraded by body habitus and streak artifact. Liver and gallbladder are unremarkable. Visualized portions of the adrenal glands, kidneys, spleen, pancreas, stomach and bowel are unremarkable. Musculoskeletal: Degenerative changes in the spine. Old right rib fracture. No worrisome lytic or sclerotic lesions. IMPRESSION: 1. Pulmonary parenchymal pattern of fibrosis is unchanged and in keeping with biopsy-proven fibrotic nonspecific interstitial pneumonitis. 2. Enlarged pulmonic trunk, indicative pulmonary arterial hypertension. Electronically Signed   By: Lorin Picket M.D.   On: 09/06/2019 09:19   DG CHEST PORT 1 VIEW  Result Date: 09/08/2019 CLINICAL DATA:  Respiratory distress with hypoxia. EXAM: PORTABLE CHEST 1 VIEW COMPARISON:  Radiograph 08/11/2019.  CT 09/05/2019 FINDINGS: Interstitial lung disease with diffuse interstitial opacities similar to prior exam. Chronic elevation of right hemidiaphragm. No superimposed focal airspace disease. Mild cardiomegaly is unchanged. No significant pleural effusion. No pneumothorax. Stable osseous structures. IMPRESSION: 1. Stable interstitial lung disease recent imaging. No superimposed focal airspace disease. 2. Stable mild cardiomegaly. Electronically Signed   By: Keith Rake M.D.   On: 09/08/2019 17:06   Assessment and Plan:   1. Acute on chronic diastolic CHF complicated by acute on chronic respiratory failure in the setting of severe autoimmune ILD with severe PAH with RV failure: -Pt presented to [facility] on 08/08/2019 with progressive SOB and cough for several days. HPI obtained mostly through chart review given patient current respiratory status limiting her communication.  -EKG with NSR>>Qtc prolonged at 523m.  -CXR showed stable chronic interstitial lung disease. -WBC elevated at 13.8. LA elevated at 5.4.  -hsT found to be 206  -BNP  at 1982 with a [place] at 544 [place] showed moderate-range pulmonary arterial hypertension with subsequent echocardiogram showing D-shaped intraventricular septum with moderately dilated and mod-severely dysfunctional RV with estimated PASP 66 mmHg, bubble study negative. -Repeat echocardiogram 09/05/19 this admission with LV at 50-55% with septum flattening consistent with RV volume overload with an RV systolic pressure at 768.0HOZYwith moderate to severe TR  -She has been treated with IV Lasix, Solumedrol, Cellcept, and sildenafil  -Would continue IV Lasix.  Will place PICC line to check CVP to guide diuresis, and check Co-ox.  If low Co-ox, may need milrinone for right heart failure -Weight, 196lb todaywith a weight 05/2019 at 211lb  -I&O, net negative 5L   2. Severe pulmonary HTN: -Most recent echocardiogram this admission with worsening RV failure as above  -Place PICC as above to check CVP/CO-ox -Continue current regimen   3. Severe TR: -On echocardiogram as above -Due to severe co-morbid lung conditions and PAH, not a candidate for further invasive valve workup   4.  History of DVT: -Continue  Eliquis -No s/s of bleeding in stool or urine   5.  Hypertension: -BP stabilizing, 125/77>105/81>121/106 -Continue current dosing  -On verapamil for SVT, will discontinue given right heart failure  6. Chest pain: -Pt had complaints of chest pain today -EKG and hsT ordered>>>likely in the setting of worsening lung and RV disease -No prior hx of CAD, no calcifications on imaging   7. QT prolongation -Avoid QT prolonging agents -Maintain K>4, Mag >2   For questions or updates, please contact [place] Please consult [email or web address] for contact info under Cardiology/STEMI.   Lyndel Safe NP-C [facility] Pager: [phone] 09/09/2019 1:26 PM    Patient seen and examined.  Agree with above documentation.  Felicia [name] is a 49 year old female with a history of  severe pulmonary hypertension, ILD, HFpEF, SVT, type 2 diabetes, hypertension, DVT who we are consulted to see by Dr. Nevada Crane for worsening RV failure.  She [facility] on 7/27 with worsening shortness of breath and cough.  Labs notable for BNP 1982, high-sensitivity troponin 206.  Echocardiogram showed severe RV systolic dysfunction and RV dilatation, RVSP 73, LVEF 50 to 55%.  Echocardiogram personally reviewed and shows progression of RV failure from prior echo in March 2021.  She is being treated for right heart failure and ILD flare.  She is on IV Lasix 20 mg q 6hrs and IV Solu-Medrol 60 mg every 12 hours.  She remains on high flow oxygen 20 to 30 L/min.  On exam, patient is alert and oriented, tachycardic, regular rhythm, no murmurs, bibasilar crackles, +JVD.  EKG today shows sinus rhythm with PACs with aberrant conduction, nonspecific T wave flattening, QTc 515.  Telemetry personally reviewed and shows sinus tachycardia with rates 100s to 130s, NSVT x5 beats.  [facility].  Has been on sildenafil and tyvaso.  Most recent RHC was 04/26/2019, which showed normal filling pressures (RA 7, PCWP 10) but significant PAH with PVR 7.45 WU and low CI at 1.9.  For her pulmonary hypertension, has group 1 and 3 PH.  Has been on sildenafil and Tyvaso.  Worsening RV failure.  Continue diuresis.  Will stop verapamil which she is on for SVT given her right heart failure.  Discussed with Dr Aundra Dubin in [place] Failure, will place PICC line to check Co-ox.  May need milrinone based on results.  Will monitor CVP after PICC line placed to guide diuresis.  Donato Heinz, MD

## 2019-09-09 NOTE — Progress Notes (Signed)
PROGRESS NOTE  Felicia [name] MBW:[id number] DOB: 01-09-1971 DOA: 08/30/2019 PCP: Aretta Nip, MD  HPI/Recap of past 24 hours: Patient was admitted to the hospital with working diagnosis of acute on chronic hypoxic respiratory failure, due to autoimmune interstitial lung disease, NSIP flare/ right heart failure/severe pulmonary hypertension/core pulmonale.  49 year old female with past medical history for nonspecific interstitial pneumonitis, autoimmune interstitial lung disease, chronic hypoxia on high flow nasal cannula at baseline, severe pulmonary hypertension, right heart failure, type 2 diabetes mellitus, DVT on Eliquis who presented with worsening dyspnea, associated with cough and worsening orthopnea.On her initial physical examination she was in respiratory distress, respiratory rate 40 breaths/min, oxygen saturation 93% on 25 L high flow nasal cannula.Her lungs had diffuse rales bilaterally. Troponin I 206-193. BNP 1982, white count 13.8, hemoglobin 13.8, hematocrit 47.6, platelets 249.SARS COVID-19 negative. Urinalysis 21-50 red cells, more than 50 white cells, specific gravity 1.012. Chest radiograph with bilateral interstitial infiltrates.EKG 89 bpm, rightward axis, prolonged QTC 500, sinus rhythm, no ST segment or T wave changes.  She started on aggressive diuresis and high dose IV steroids with improvement of her symptomatology. She has been on non rebreather 15 L/ min plus heated high flow nasal cannula 30 L/ min, and weaning off as tolerated.   Consultation wit [facility] over the phone, patient is very high risk for transfer, severe hypoxemic respiratory failure due to combination of ILD and severe pulmonary hypertension.   Echocardiogram with worsening RV systolic failure, CT chest with stable ILD pattern.   Likely hypoxic respiratory failure predominately due to worsening severe pulmonary hypertension with RV failure.   Cardiology has been  consulted due to worsening right ventricular systolic failure.  Pulmonary following.  Appreciate specialists assistance.  09/09/19: Seen and examined.  Reports some discomfort in her left ear.  Her dyspnea is about the same.     Assessment/Plan: Principal Problem:   Acute on chronic respiratory failure with hypoxia (HCC) Active Problems:   Acute on chronic diastolic CHF (congestive heart failure) (HCC)   Interstitial lung disease (HCC)   Hypokalemia   History of DVT (deep vein thrombosis)   Prolonged Q-T interval on ECG   Uncontrolled diabetes mellitus with hyperglycemia (HCC)   Elevated troponin   Pressure injury of skin   Palliative care by specialist  Acute on chronic hypoxic respiratory failure/autoimmune ILD NSIP/acute on chronicright heart failure with core pulmonale/severepulmonary hypertension. 2019 DLCO 17,  HRCT 2021 with progressive fibrotic ILD,  Right heart cath 04/2019 PA pressure 60/24 with mean 39. Follow up echocardiogram with severe and worsening reduction in RV systolic function, with severe elevated pulmonary artery systolic pressure, estimated 73 mmHg.  CT chest fibrosis pattern unchanged. Patient with persistent dyspnea, worse with minimal efforts.  Currently on high flow nasal cannula and nonrebreather, weaning off as tolerated  Will decrease Fi02 to 90% to target oxygen saturation more than 88%, to prevent further toxicity/ lung injury  from high Fi02.   Medical therapy with: Methylprednisolone 60 bid, continued Furosemide 60 bid, dose decreased to 20 mg IV Lasix every 6 hours due to soft blood pressures  Sildenafil, continued Mycophenolate, continued  Plan to wean off Fi02 at least dow to 60 % before discharge home.   Severe pulmonary hypertension/cor pulmonale Worsening right heart failure on 2D echo done on 09/05/2019 Currently on IV Lasix, sildenafil and low-dose atenolol 12.5 mg twice daily Cardiology has been consulted to assist with  management Continue strict I's and O's and daily weight Net I&O -4.7  L  Autoimmune interstitial lung disease/nonspecific interstitial pneumonitis Follows [facility] pulmonary at [facility] risk for decompensation with transfer at this point Currently on CellCept, IV Lasix and IV Solu-Medrol Pulmonary following, appreciate recommendations  Hypokalemia/ hyponatremia/ hypomagnesemia, diuretic induced. Renal function stable with serum cr at 0.69 Serum potassium 3.2, repleted orally Serum magnesium 2.0, at goal.   UncontrolledT2DMwith Hgb A1c 8,8 with hyperglycemia, exacerbated by IV steroids Currently on IV Solu-Medrol 60 twice daily Increase Lantus to 35 units nightly Add NovoLog 3 units before meals Continue insulin sliding scale  DVT. Diagnosed May 14, 2019  Continue Eliquis  Prolonged QTc. Twelve-lead EKG done on 09/05/2019, QTC 568 Avoid QTC prolonging agents Repeat twelve-lead EKG on 09/09/2019 Continue to closely monitor on progressive unit.    Left ear discomfort She is on Bactrim prophylactically Monday Wednesday Friday Use doxycycline due to QTC prolongation  Obesity.  BMI 32  Weight loss outpatient once stable  Stage 2 pressure ulcer at the buttocks.Present on admission,Continue with local wound care.  Patient continue to be at high risk for worsening hypoxic respiratory failure   Status is: Inpatient  Remains inpatient appropriate because:IV treatments appropriate due to intensity of illness or inability to take PO   Dispo: The patient is from: Home  Anticipated d/c is to: Home  Anticipated d/c date is: 09/12/19  Patient currently is not medically stable to d/c. Plan to reach at least Fi02 60% before discharge home. She uses at home 2 oxygen concentrators and gets 20 L/min per [place].     DVT prophylaxis:      apixaban   Code Status:              full  Family Communication:  None at bedside.   Nutrition  Status:     Skin Documentation: Pressure Injury 09/04/19 Buttocks Left Stage 2 -  Partial thickness loss of dermis presenting as a shallow open injury with a red, pink wound bed without slough. small circular size of pencil (Active)  09/04/19 0200  Location: Buttocks  Location Orientation: Left  Staging: Stage 2 -  Partial thickness loss of dermis presenting as a shallow open injury with a red, pink wound bed without slough.  Wound Description (Comments): small circular size of pencil  Present on Admission: Yes     Consultants:   Pulmonary   Cardiology     Objective: Vitals:   09/09/19 0900 09/09/19 1000 09/09/19 1100 09/09/19 1123  BP:  (!) 121/106  105/81  Pulse: (!) 120  (!) 123 (!) 119  Resp: (!) 25 (!) 35  19  Temp:    97.8 F (36.6 C)  TempSrc:    Oral  SpO2: 94% 98%  96%  Weight:        Intake/Output Summary (Last 24 hours) at 09/09/2019 1148 Last data filed at 09/09/2019 1124 Gross per 24 hour  Intake 120 ml  Output 1801 ml  Net -1681 ml   Filed Weights   09/06/19 0417 09/07/19 0500 09/09/19 0500  Weight: 87.6 kg 86.9 kg 89 kg    Exam:  . General: 49 y.o. year-old female with a recurrent nausea no acute distress.  Alert and noted x3.  On high flow nasal cannula and nonrebreather.  Left ear no wounds appreciated. . Cardiovascular: Tachycardic no rubs or gallops.   [name] Respiratory: Mild diffuse rales bilaterally no wheezing noted.  Poor inspiratory effort..  . Abdomen: Soft nontender normal bowel sounds present . Musculoskeletal: No lower extremity edema bilaterally.   [name]  Psychiatry: Mood is appropriate for condition and setting.  Data Reviewed: CBC: Recent Labs  Lab 08/12/2019 2141 08/20/2019 2224 09/04/19 0354 09/05/19 0044 09/09/19 0715  WBC 13.8*  --  9.9 13.4* 14.2*  NEUTROABS  --   --   --   --  12.9*  HGB 13.8 16.0* 12.5 12.6 12.7  HCT 47.6* 47.0* 43.5 42.6 43.6  MCV 86.5  --  84.1 83.0 83.4  PLT 259  --  237 239 373   Basic Metabolic  Panel: Recent Labs  Lab 08/11/2019 2141 08/19/2019 2224 09/05/19 0044 09/05/19 0044 09/05/19 1701 09/06/19 0437 09/07/19 0200 09/08/19 0247 09/09/19 0715  NA 138   < > 139   < > 139 138 132* 135 138  K 3.3*   < > 2.9*   < > 3.6 4.4 4.4 3.8 3.2*  CL 90*   < > 93*   < > 94* 96* 94* 93* 88*  CO2 27   < > 32   < > _0 39*  GLUCOSE 455*   < > 228*   < > 217* 188* 317* 308* 203*  BUN 28*   < > 34*   < > 38* 36* 36* 30* 25*  CREATININE 1.09*   < > 1.11*   < > 1.24* 0.99 0.85 0.81 0.69  CALCIUM 9.4   < > 8.6*   < > 8.5* 8.4* 8.3* 8.3* 8.6*  MG 2.0  --  1.8  --   --  2.2 2.2  --  2.0  PHOS 4.5  --   --   --   --   --   --   --  2.9   < > = values in this interval not displayed.   GFR: Estimated Creatinine Clearance: 93.7 mL/min (by C-G formula based on SCr of 0.69 mg/dL). Liver Function Tests: Recent Labs  Lab 08/18/2019 2141 09/09/19 0715  AST 25 23  ALT 29 56*  ALKPHOS 80 196*  BILITOT 1.6* 1.2  PROT 5.6* 5.9*  ALBUMIN 3.5 3.6   No results for input(s): LIPASE, AMYLASE in the last 168 hours. No results for input(s): AMMONIA in the last 168 hours. Coagulation Profile: Recent Labs  Lab 08/27/2019 2141  INR 1.5*   Cardiac Enzymes: No results for input(s): CKTOTAL, CKMB, CKMBINDEX, TROPONINI in the last 168 hours. BNP (last 3 results) No results for input(s): PROBNP in the last 8760 hours. HbA1C: No results for input(s): HGBA1C in the last 72 hours. CBG: Recent Labs  Lab 09/08/19 1101 09/08/19 1638 09/08/19 2137 09/09/19 0613 09/09/19 1122  GLUCAP 279* 359* 297* 219* 243*   Lipid Profile: No results for input(s): CHOL, HDL, LDLCALC, TRIG, CHOLHDL, LDLDIRECT in the last 72 hours. Thyroid Function Tests: No results for input(s): TSH, T4TOTAL, FREET4, T3FREE, THYROIDAB in the last 72 hours. Anemia Panel: No results for input(s): VITAMINB12, FOLATE, FERRITIN, TIBC, IRON, RETICCTPCT in the last 72 hours. Urine analysis:    Component Value Date/Time   COLORURINE  AMBER (A) 09/04/2019 0744   APPEARANCEUR CLOUDY (A) 09/04/2019 0744   LABSPEC 1.012 09/04/2019 0744   PHURINE 6.0 09/04/2019 0744   GLUCOSEU >=500 (A) 09/04/2019 0744   HGBUR SMALL (A) 09/04/2019 0744   BILIRUBINUR NEGATIVE 09/04/2019 0744   KETONESUR NEGATIVE 09/04/2019 0744   PROTEINUR 30 (A) 09/04/2019 0744   UROBILINOGEN 0.2 12/24/2008 1604   NITRITE POSITIVE (A) 09/04/2019 0744   LEUKOCYTESUR LARGE (A) 09/04/2019 0744   Sepsis Labs: _1 (procalcitonin:4,lacticidven:4)  ) Recent  Results (from the past 240 hour(s))  SARS Coronavirus 2 by RT PCR (hospital order, performed in [facility] hospital lab) Nasopharyngeal Nasopharyngeal Swab     Status: None   Collection Time: 08/15/2019 11:01 PM   Specimen: Nasopharyngeal Swab  Result Value Ref Range Status   SARS Coronavirus 2 NEGATIVE NEGATIVE Final    Comment: (NOTE) SARS-CoV-2 target nucleic acids are NOT DETECTED.  The SARS-CoV-2 RNA is generally detectable in upper and lower respiratory specimens during the acute phase of infection. The lowest concentration of SARS-CoV-2 viral copies this assay can detect is 250 copies / mL. A negative result does not preclude SARS-CoV-2 infection and should not be used as the sole basis for treatment or other patient management decisions.  A negative result may occur with improper specimen collection / handling, submission of specimen other than nasopharyngeal swab, presence of viral mutation(s) within the areas targeted by this assay, and inadequate number of viral copies (<250 copies / mL). A negative result must be combined with clinical observations, patient history, and epidemiological information.  Fact Sheet for Patients:   StrictlyIdeas.no  Fact Sheet for Healthcare Providers: BankingDealers.co.za  This test is not yet approved or  cleared by the [place] FDA and has been authorized for detection and/or diagnosis of  SARS-CoV-2 by FDA under an Emergency Use Authorization (EUA).  This EUA will remain in effect (meaning this test can be used) for the duration of the COVID-19 declaration under Section 564(b)(1) of the Act, 21 U.S.C. section 360bbb-3(b)(1), unless the authorization is terminated or revoked sooner.  Performed at [facility], [place] [address], [place], [place] [address]   Culture, blood (routine x 2)     Status: None (Preliminary result)   Collection Time: 09/04/19 12:47 AM   Specimen: BLOOD RIGHT HAND  Result Value Ref Range Status   Specimen Description BLOOD RIGHT HAND  Final   Special Requests   Final    BOTTLES DRAWN AEROBIC AND ANAEROBIC Blood Culture adequate volume   Culture   Final    NO GROWTH 4 DAYS Performed at [facility], [place] [address], [place], [place] [address]    Report Status PENDING  Incomplete  Culture, blood (routine x 2)     Status: None (Preliminary result)   Collection Time: 09/04/19  3:53 AM   Specimen: BLOOD  Result Value Ref Range Status   Specimen Description BLOOD LEFT ARM  Final   Special Requests   Final    BOTTLES DRAWN AEROBIC AND ANAEROBIC Blood Culture adequate volume   Culture   Final    NO GROWTH 4 DAYS Performed at [facility], [place] [address], [place], [place] [address]    Report Status PENDING  Incomplete  Urine culture     Status: Abnormal   Collection Time: 09/04/19  7:44 AM   Specimen: Urine, Random  Result Value Ref Range Status   Specimen Description URINE, RANDOM  Final   Special Requests   Final    NONE Performed at [facility], [place] [address], [place], [place] [address]    Culture MULTIPLE SPECIES PRESENT, SUGGEST RECOLLECTION (A)  Final   Report Status 09/05/2019 FINAL  Final  MRSA PCR Screening     Status: None   Collection Time: 09/04/19  9:15 AM   Specimen: Nasal Mucosa; Nasopharyngeal  Result Value Ref Range Status   MRSA by PCR NEGATIVE NEGATIVE Final    Comment:  The GeneXpert MRSA  Assay (FDA approved for NASAL specimens only), is one component of a comprehensive MRSA colonization surveillance program. It is not intended to diagnose MRSA infection nor to guide or monitor treatment for MRSA infections. Performed at [facility], [place] [address] [place], [place] [address]       Studies: DG CHEST PORT 1 VIEW  Result Date: 09/08/2019 CLINICAL DATA:  Respiratory distress with hypoxia. EXAM: PORTABLE CHEST 1 VIEW COMPARISON:  Radiograph 08/30/2019.  CT 09/05/2019 FINDINGS: Interstitial lung disease with diffuse interstitial opacities similar to prior exam. Chronic elevation of right hemidiaphragm. No superimposed focal airspace disease. Mild cardiomegaly is unchanged. No significant pleural effusion. No pneumothorax. Stable osseous structures. IMPRESSION: 1. Stable interstitial lung disease recent imaging. No superimposed focal airspace disease. 2. Stable mild cardiomegaly. Electronically Signed   By: Keith Rake M.D.   On: 09/08/2019 17:06    Scheduled Meds: . apixaban  5 mg Oral BID  . atenolol  12.5 mg Oral BID  . bisacodyl  10 mg Rectal Once  . escitalopram  10 mg Oral Daily  . furosemide  20 mg Intravenous Q6H  . insulin aspart  0-15 Units Subcutaneous TID WC  . insulin aspart  0-5 Units Subcutaneous QHS  . insulin aspart  3 Units Subcutaneous TID WC  . insulin glargine  35 Units Subcutaneous QHS  . ipratropium-albuterol  3 mL Nebulization TID  . methylPREDNISolone (SOLU-MEDROL) injection  60 mg Intravenous Q12H  . mycophenolate  1,500 mg Oral BID  . polyethylene glycol  17 g Oral Daily  . potassium chloride  40 mEq Oral BID  . Ensure Max Protein  11 oz Oral Daily  . senna-docusate  2 tablet Oral BID  . sildenafil  20 mg Oral BID  . sodium chloride flush  3 mL Intravenous Q12H  . [START ON 09/11/2019] verapamil  120 mg Oral QHS    Continuous Infusions: . sodium chloride 10 mL/hr at 09/05/19 0253     LOS: 6 days     Kayleen Memos,  MD [facility] Pager [phone]  If 7PM-7AM, please contact night-coverage [email or web address] Password [facility] 09/09/2019, 11:48 AM

## 2019-09-09 NOTE — Consult Note (Addendum)
Advanced Heart Failure Team Consult Note   Primary Physician: Aretta Nip, MD PCP-Cardiologist:  Minus Breeding, MD  [facility]: Dr [name] and Dr Eulis Manly  Reason for Consultation: Heart Failure   HPI:    Felicia [name] is seen today for evaluation of heart failure  at the request of Dr Gardiner Rhyme.   Felicia [name] is a 49 year old with history of NSIP since 2010 and has been on chronic prednisione, PAH, SVT, chronic respiratory failure on oxygen on 12 liters at rest and 15 liters exertion, DM, DVT 01/2019 on eliquis, and HTN.   Followed closely at [facility] by Dr [name] for [facility]. Has been treated with revatio and tyvaso. Most recent cath 04/2017 at [facility] RA 8, PA 56/28 (38), PCWP 12, CO 4.2, CI 2, and PVR 6.3.Rheumatologic workup back in 2011 at the time of their diagnosis while D was negative except for mildly elevated aldolase. Anti-Jo, RNP, SM, rheumatoid factor, CCP, double-stranded DNA, ANA etc. were all normal.   Admitted to [name] in January of this year with increased dyspnea. HRCT was c/w chronic fibrotic ILD. Placed on steroids,  IV rituximab + tyvaso.   Admitted in March 2021 with increased shortness of breath. RHC completed at showed optimized filling pressures and elevated PVR 7.5 and CI 1.9.   Virtual visit with Dr. [name] on 3/30.  Was taking the sildenafil twice a day, asked to increase to 3 times daily.  Was asked to use the treprostinil 4 times a day, decrease if she has to, but try to work her way back up to 5 or 6 breaths each time.  Her weight was 216 pounds.  PAH regimen - inhaled tyvaso + sildenafil---> She stopped tyvaso back in April of this year due to cough/increase shortness of breath.    Presented to [facility] on 08/21/2019 with increased shortness of breath. Prior to admit she was using 20 liters oxygen at home. Tachypnea noted on arrival. CXR showed stable interstitial lung disease.Placed on HFNC .  Pertinent admission labs: SARs 2 negative, BNP 1982, HS Trop 206,  Lactic Acid 5.4, WBC 13.8 . Received steroids and nebs.   Pulmonary consulted with recommendations to diurese with IV lasix, IV solumedrol every 8 hours, and transfer to [facility] for possible IV prostacyclin. Primary Team reached out to [facility] with  [facility] over the phone 09/06/19 patient is very high risk for transfer, severe hypoxemic respiratory failure due to combination of ILD and severe pulmonary hypertension. Not clear if vasodilator therapy will make major difference, and positive pressure ventilation likely with trigger a rapid hemodynamic decompensation.  Palliative Care consulted for goals of care. She wants to continue aggressive care.   Remains on HFNC 30 L/min FiO2 80% with sats in the 80s. Continues to diurese with IV lasix. BNP trending down 1982 on admit >544 on 8/2.     Remains SOB talking. Says she feels better.   Blood Cultures: 09/04/19 NGTD  ECHO 09/05/19 EF 50-55% RV severely reduced  RVSP 73 RA severely dilated. Compared to prior echo 04/25/19, worsening RV dilatation and systolic dysfunction Echo 04/2019 EF 55% RV moderated reduced RVSP 64 HRCT 02/2019 chronic fibrotic ILD progressed from 2018 with mild focus of honeycombing Review of Systems: [y] = yes, _0  = no   . General: Weight gain _1 ; Weight loss _2 ; Anorexia _3 ; Fatigue [Y ]; Fever _4 ; Chills _5 ; Weakness _6   . Cardiac: Chest pain/pressure _7 ; Resting SOB [Y ];  Exertional SOB [ Y]; Orthopnea [Y ]; Pedal Edema _0 ; Palpitations _1 ; Syncope _2 ; Presyncope _3 ; Paroxysmal nocturnal dyspnea_4   . Pulmonary: Cough _5 ; Wheezing_6 ; Hemoptysis_7 ; Sputum _8 ; Snoring _9   . GI: Vomiting_10 ; Dysphagia_11 ; Melena_12 ; Hematochezia _13 ; Heartburn_14 ; Abdominal pain _15 ; Constipation _16 ; Diarrhea _17 ; BRBPR _18   . GU: Hematuria_19 ; Dysuria _20 ; Nocturia_21   . Vascular: Pain in legs with walking _22 ; Pain in feet with lying flat _23 ; Non-healing sores _24 ; Stroke _25 ; TIA _26 ; Slurred speech _27 ;  . Neuro: Headaches[  ]; Vertigo_28 ; Seizures_29 ; Paresthesias_30 ;Blurred vision _31 ; Diplopia _32 ; Vision changes _33   . Ortho/Skin: Arthritis _34 ; Joint pain [Y ]; Muscle pain _35 ; Joint swelling _36 ; Back Pain [Y ]; Rash _37   . Psych: Depression_38 ; Anxiety_39   . Heme: Bleeding problems _40 ; Clotting disorders _41 ; Anemia _42   . Endocrine: Diabetes [Y ]; Thyroid dysfunction_43   Home Medications Prior to Admission medications   Medication Sig Start Date End Date Taking? Authorizing Provider  acetaminophen (TYLENOL) 500 MG tablet Take 2,000 mg by mouth as needed for moderate pain.    Yes [provider]  albuterol (PROVENTIL HFA;VENTOLIN HFA) 108 (90 Base) MCG/ACT inhaler Inhale 1 puff into the lungs every 6 (six) hours as needed for wheezing or shortness of breath.   Yes [provider]  apixaban (ELIQUIS) 5 MG TABS tablet Take 1 tablet (5 mg total) by mouth 2 (two) times daily. 08/02/19  Yes Olalere, Adewale A, MD  atenolol (TENORMIN) 50 MG tablet Take 75 mg by mouth 2 (two) times daily.  01/08/17  Yes [provider]  calcium citrate (CALCITRATE - DOSED IN MG ELEMENTAL CALCIUM) 950 (200 Ca) MG tablet Take 200 mg of elemental calcium by mouth daily.   Yes [provider]  cholecalciferol (VITAMIN D3) 25 MCG (1000 UT) tablet Take 1,000 Units by mouth daily.   Yes [provider]  escitalopram (LEXAPRO) 10 MG tablet Take 10 mg by mouth daily. 08/20/19  Yes [provider]  furosemide (LASIX) 20 MG tablet Take 3 tablets (60 mg total) by mouth 2 (two) times daily. 06/04/19 09/04/19 Yes Arrien, Jimmy Picket, MD  guaiFENesin (MUCINEX) 600 MG 12 hr tablet Take 600 mg by mouth daily.    Yes [provider]  ibuprofen (ADVIL) 200 MG tablet Take 400 mg by mouth every 6 (six) hours as needed for moderate pain.   Yes [provider]  insulin lispro (HUMALOG KWIKPEN) 100 UNIT/ML KwikPen Inject 0-20 Units into the skin 2 (two) times daily. Sliding scale based  on sugar level   Yes [provider]  ipratropium-albuterol (DUONEB) 0.5-2.5 (3) MG/3ML SOLN Inhale 3 mLs into the lungs every 6 (six) hours as needed (sob and wheezing).  01/08/17  Yes [provider]  metFORMIN (GLUCOPHAGE-XR) 500 MG 24 hr tablet Take 1,000 mg by mouth at bedtime.  04/10/19  Yes [provider]  mycophenolate (CELLCEPT) 500 MG tablet Take 1,500 mg by mouth 2 (two) times daily.    Yes [provider]  predniSONE (DELTASONE) 10 MG tablet TAKE 4 TABLETS BY MOUTH DAILY WITH BREAKFAST Patient taking differently: Take 40 mg by mouth daily with breakfast.  08/02/19  Yes Olalere, Adewale A, MD  sildenafil (REVATIO) 20 MG tablet Take 20 mg by mouth 2 (two)  times daily. 04/15/19  Yes [provider]  sulfamethoxazole-trimethoprim (BACTRIM DS) 800-160 MG tablet Take 1 tablet by mouth 3 (three) times a week. As needed for sinus infection 12/24/18  Yes [provider]  verapamil (CALAN-SR) 120 MG CR tablet Take 1 tablet (120 mg total) by mouth at bedtime. 09/23/14  Yes Etta Quill, NP  gabapentin (NEURONTIN) 300 MG capsule Take 1 capsule (300 mg total) by mouth 3 (three) times daily for 7 days. 04/28/19 05/30/19  Cherylann Ratel A, DO  insulin degludec (TRESIBA FLEXTOUCH) 100 UNIT/ML FlexTouch Pen Inject 24 Units into the skin daily.    [provider]  LANTUS SOLOSTAR 100 UNIT/ML Solostar Pen Inject 30 Units into the skin at bedtime. 08/28/19   [provider]  potassium chloride SA (KLOR-CON) 20 MEQ tablet Take 2 tablets (40 mEq total) by mouth daily. 04/29/19 05/30/19  Jonnie Finner, DO    Past Medical History: Past Medical History:  Diagnosis Date  . Diabetes mellitus without complication ([place])    type 2   . Hypertension   . NSIP (nonspecific interstitial pneumonitis) ([place])   . Pulmonary hypertension ([place]) 2011  . Respiratory failure with hypoxia ([place]) 05/31/2019  . SVT (supraventricular tachycardia) ([facility])     Past  Surgical History: Past Surgical History:  Procedure Laterality Date  . CESAREAN SECTION    . LUNG BIOPSY    . RIGHT HEART CATH N/A 04/26/2019   Procedure: RIGHT HEART CATH;  Surgeon: Larey Dresser, MD;  Location: [facility];  Service: Cardiovascular;  Laterality: N/A;    Family History: Family History  Problem Relation Age of Onset  . Hypertension Mother   . Cancer Father   . Diabetes Father   . Hypertension Father   . Diabetes Sister   . Mental retardation Sister   . Cancer Maternal Grandmother   . Cancer Maternal Grandfather   . Cancer Paternal Grandmother   . Cancer Paternal Grandfather     Social History: Social History   Socioeconomic History  . Marital status: Single    Spouse name: Not on file  . Number of children: Not on file  . Years of education: Not on file  . Highest education level: Not on file  Occupational History  . Not on file  Tobacco Use  . Smoking status: Never Smoker  . Smokeless tobacco: Never Used  . Tobacco comment: never smoked or used tobacco products  Vaping Use  . Vaping Use: Never used  Substance and Sexual Activity  . Alcohol use: No  . Drug use: No  . Sexual activity: Not on file  Other Topics Concern  . Not on file  Social History Narrative   One daugther.  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     Social Determinants of Health   Financial Resource Strain:   . Difficulty of Paying Living Expenses:   Food Insecurity:   . Worried About Charity fundraiser in the Last Year:   . Arboriculturist in the Last Year:   Transportation Needs:   . Film/video editor (Medical):   [name] Lack of Transportation (Non-Medical):   Physical Activity:   . Days of Exercise per Week:   . Minutes of Exercise per Session:   Stress:   . Feeling of Stress :   Social Connections:   . Frequency of Communication with Friends and Family:   . Frequency of Social Gatherings with Friends and Family:   . Attends Religious Services:   .  Active Member of  Clubs or Organizations:   . Attends Archivist Meetings:   [name] Marital Status:     Allergies:  Allergies  Allergen Reactions  . Lexapro [Escitalopram] Nausea Only    Objective:    Vital Signs:   Temp:  [97.5 F (36.4 C)-98.5 F (36.9 C)] 97.7 F (36.5 C) (08/03 0404) Pulse Rate:  [60-131] 65 (08/03 0404) Resp:  [15-35] 17 (08/03 0404) BP: (94-144)/(57-109) 103/74 (08/03 0404) SpO2:  [90 %-100 %] 97 % (08/03 0734) FiO2 (%):  [80 %-90 %] 80 % (08/03 0734) Weight:  [93 kg] 93 kg (08/03 0403) Last BM Date: 09/05/19  Weight change: Filed Weights   09/07/19 0500 09/09/19 0500 09/10/19 0403  Weight: 86.9 kg 89 kg 93 kg    Intake/Output:   Intake/Output Summary (Last 24 hours) at 09/10/2019 0832 Last data filed at 09/10/2019 0405 Gross per 24 hour  Intake 360 ml  Output 1800 ml  Net -1440 ml      Physical Exam    General:  Dyspeic talking.  HEENT: normal Neck: supple. JVP hard to asses . Carotids 2+ bilat; no bruits. No lymphadenopathy or thyromegaly appreciated. Cor: PMI nondisplaced. Regular rate & rhythm. No rubs, gallops or murmurs. Lungs: Decreased. On HFNC Abdomen: soft, nontender, nondistended. No hepatosplenomegaly. No bruits or masses. Good bowel sounds. Extremities: no cyanosis, clubbing, rash, edema Neuro: alert & orientedx3, cranial nerves grossly intact. moves all 4 extremities w/o difficulty. Affect pleasant   Telemetry   SR 90s   EKG    ST Pac 09/05/19   Labs   Basic Metabolic Panel: Recent Labs  Lab 08/24/2019 2141 08/08/2019 2224 09/05/19 0044 09/05/19 1701 09/06/19 0437 09/06/19 0437 09/07/19 0200 09/07/19 0200 09/08/19 0247 09/09/19 0715 09/10/19 0330  NA 138   < > 139   < > 138  --  132*  --  135 138 134*  K 3.3*   < > 2.9*   < > 4.4  --  4.4  --  3.8 3.2* 4.0  CL 90*   < > 93*   < > 96*  --  94*  --  93* 88* 89*  CO2 27   < > 32   < > 29  --  24  --  30 39* 36*  GLUCOSE 455*   < > 228*   < > 188*  --  317*  --  308* 203*  148*  BUN 28*   < > 34*   < > 36*  --  36*  --  30* 25* 30*  CREATININE 1.09*   < > 1.11*   < > 0.99  --  0.85  --  0.81 0.69 0.60  CALCIUM 9.4   < > 8.6*   < > 8.4*   < > 8.3*   < > 8.3* 8.6* 8.7*  MG 2.0   < > 1.8  --  2.2  --  2.2  --   --  2.0 2.0  PHOS 4.5  --   --   --   --   --   --   --   --  2.9  --    < > = values in this interval not displayed.    Liver Function Tests: Recent Labs  Lab 08/14/2019 2141 09/09/19 0715  AST 25 23  ALT 29 56*  ALKPHOS 80 196*  BILITOT 1.6* 1.2  PROT 5.6* 5.9*  ALBUMIN 3.5 3.6   No results for input(s):  LIPASE, AMYLASE in the last 168 hours. No results for input(s): AMMONIA in the last 168 hours.  CBC: Recent Labs  Lab 08/31/2019 2141 09/04/2019 2224 09/04/19 0354 09/05/19 0044 09/09/19 0715  WBC 13.8*  --  9.9 13.4* 14.2*  NEUTROABS  --   --   --   --  12.9*  HGB 13.8 16.0* 12.5 12.6 12.7  HCT 47.6* 47.0* 43.5 42.6 43.6  MCV 86.5  --  84.1 83.0 83.4  PLT 259  --  237 239 178    Cardiac Enzymes: No results for input(s): CKTOTAL, CKMB, CKMBINDEX, TROPONINI in the last 168 hours.  BNP: BNP (last 3 results) Recent Labs    05/31/19 0025 08/09/2019 2141 09/09/19 0715  BNP 1,085.4* 1,982.4* 544.6*    ProBNP (last 3 results) No results for input(s): PROBNP in the last 8760 hours.   CBG: Recent Labs  Lab 09/09/19 0613 09/09/19 1122 09/09/19 1640 09/09/19 2200 09/10/19 0610  GLUCAP 219* 243* 223* 170* 136*    Coagulation Studies: No results for input(s): LABPROT, INR in the last 72 hours.   Imaging   [place] EKG SITE RITE  Result Date: 09/09/2019 If Site Rite image not attached, placement could not be confirmed due to current cardiac rhythm.    Medications:     Current Medications: . apixaban  5 mg Oral BID  . atenolol  12.5 mg Oral BID  . bisacodyl  10 mg Rectal Once  . doxycycline  100 mg Oral Q12H  . escitalopram  10 mg Oral Daily  . furosemide  20 mg Intravenous Q6H  . insulin aspart  0-15 Units Subcutaneous  TID WC  . insulin aspart  0-5 Units Subcutaneous QHS  . insulin aspart  3 Units Subcutaneous TID WC  . insulin glargine  35 Units Subcutaneous QHS  . ipratropium-albuterol  3 mL Nebulization TID  . methylPREDNISolone (SOLU-MEDROL) injection  60 mg Intravenous Q12H  . midodrine  2.5 mg Oral TID WC  . mycophenolate  1,500 mg Oral BID  . polyethylene glycol  17 g Oral Daily  . Ensure Max Protein  11 oz Oral Daily  . senna-docusate  2 tablet Oral BID  . sildenafil  20 mg Oral BID  . sodium chloride flush  3 mL Intravenous Q12H    Infusions: . sodium chloride 10 mL/hr at 09/05/19 0253      Assessment/Plan   1. A/C Hypoxic Respiratory Failure - multifactorial with NSIP, PAH, [place] On steroids + cellcept + nebs + HFNC 30 liters   2. A/C Cor Pulmonale ECHO EF 55% Severely reduced RV which has progressed from previous ECHO in March 2021 Also no shunt noted on bubble study.  - Volume status difficult to assess. Place PICC line to assess CVP/CO-OX  - Renal function stable.   3. [place] Followed [facility] by Dr [name].  She has been followed at [facility], thought to have mixed group 1 and group 3 (ILD/NSIP) PH.  -Most recent [place] 04/2019 with PVR 7 and CI 1.9. Not sure how much RHC will help.  -On sildenafil  20 mg twice a day. Increased to tid and add 2.5 mg midodrine three times a day.   - In the past she was on Tyvaso but stopped due to increased shortness of breath/cough.   4. SVT  Rate controlled.  Continue atenolol   5. Hypokalemia  K stable.   6. H/O DVT On eliquis t mg twice a day.   6. . Goals of Care Palliative follow.  Current Full Code.    Length of Stay: [place], NP  09/10/2019, 8:32 AM  Advanced Heart Failure Team Pager [phone] (M-F; 7a - 4p)  Please contact [facility] for night-coverage after hours (4p -7a ) and weekends on [email or web address]  Patient seen with NP, agree with the above note.   She has been readmitted with acute on chronic hypoxemic respiratory  failure in the setting of ILD, primarily group 3 PH, and RV failure.  She is on 30 L high flow oxygen currently.  Echo was reviewed, EF 50-55% with severely dilated and severely dysfunctional RV, PASP 73 mmHg, moderate-severe TR, dilated IVC.    General: NAD Neck: JVP 14-16 cm, no thyromegaly or thyroid nodule.  Lungs: Crackles at bases.  CV: Nondisplaced PMI.  Heart irregular S1/S2, no S3/S4, no murmur.  No peripheral edema.  No carotid bruit.  Normal pedal pulses.  Abdomen: Soft, nontender, no hepatosplenomegaly, no distention.  Skin: Intact without lesions or rashes.  Neurologic: Alert and oriented x 3.  Psych: Normal affect. Extremities: No clubbing or cyanosis.  HEENT: Normal.   1. Acute on chronic hypoxemic respiratory failure: Multifactorial. Has ILD/NSIP with possible flare as well as pulmonary hypertension/RV failure. At baseline, on 10-15 L HFNC. She is now on 30 L HFNC, IV Solumedrol, IV Lasix 20 mg q6 hrs.  - Continue treatment of ILD flare per pulmonary, on Solumedrol, mycophenolate.  - Diuresis, will change Lasix to 80 mg IV bid for now.  2. Pulmonary hypertension: She has been followed at [facility], thought to have mixed group 1 and group 3 (ILD/NSIP) PH. She has been on sildenafil and was on Tyvaso in the past. INCREASE study would suggest benefit for Tyvaso in patients like her. She does not like Tyvaso because it seems to trigger coughing => she has stopped it again and refuses to restart. Echo shows D-shaped interventricular septum with severely dilated and severely dysfunctional RV with estimated PASP 73 mmHg, moderate-severe TR, LV EF 50-55%, dilated IVC.   Bubble study negative in the past.  On exam, she appears volume overloaded.  - Continue sildenafil, would use 40 mg tid.  - She will not restart Tyvaso. Question efficacy of other selective pulmonary vasodilators given severe ILD and risk for V/Q mismatch.   - Will place PICC line to follow CVP.  If co-ox is low, will  consider starting milrinone for RV support and to lower PA pressure to allow more effective diuresis (would not use long-term).  - Add digoxin 0.125 mg daily for RV.  - Add midodrine 2.5 mg tid for soft BP.  3. RV failure: She is volume overloaded on exam, check CVP from PICC.  - Lasix 80 mg IV bid for now, replace K.    4. H/o SVT: On atenolol, quiescent.  5. H/o DVT in 2020: On Eliquis 5 mg bid. 6. ILD: NSIP. She is on steroids + Cellcept at home, followed at [facility] as outpatient.  7. Will get ECG to assess rhythm, probably NSR with PACs.    Loralie Champagne 09/10/2019 1:11 PM

## 2019-09-10 DIAGNOSIS — R0902 Hypoxemia: Secondary | ICD-10-CM

## 2019-09-10 LAB — CBC WITH DIFFERENTIAL/PLATELET
Abs Immature Granulocytes: 0.54 10*3/uL — ABNORMAL HIGH (ref 0.00–0.07)
Basophils Absolute: 0 10*3/uL (ref 0.0–0.1)
Basophils Relative: 0 %
Eosinophils Absolute: 0 10*3/uL (ref 0.0–0.5)
Eosinophils Relative: 0 %
HCT: 42.3 % (ref 36.0–46.0)
Hemoglobin: 12.3 g/dL (ref 12.0–15.0)
Immature Granulocytes: 3 %
Lymphocytes Relative: 1 %
Lymphs Abs: 0.2 10*3/uL — ABNORMAL LOW (ref 0.7–4.0)
MCH: 24.3 pg — ABNORMAL LOW (ref 26.0–34.0)
MCHC: 29.1 g/dL — ABNORMAL LOW (ref 30.0–36.0)
MCV: 83.6 fL (ref 80.0–100.0)
Monocytes Absolute: 1.1 10*3/uL — ABNORMAL HIGH (ref 0.1–1.0)
Monocytes Relative: 6 %
Neutro Abs: 16.3 10*3/uL — ABNORMAL HIGH (ref 1.7–7.7)
Neutrophils Relative %: 90 %
Platelets: 192 10*3/uL (ref 150–400)
RBC: 5.06 MIL/uL (ref 3.87–5.11)
RDW: 16.5 % — ABNORMAL HIGH (ref 11.5–15.5)
WBC: 18.1 10*3/uL — ABNORMAL HIGH (ref 4.0–10.5)
nRBC: 0.4 % — ABNORMAL HIGH (ref 0.0–0.2)

## 2019-09-10 LAB — BASIC METABOLIC PANEL
Anion gap: 9 (ref 5–15)
BUN: 30 mg/dL — ABNORMAL HIGH (ref 6–20)
CO2: 36 mmol/L — ABNORMAL HIGH (ref 22–32)
Calcium: 8.7 mg/dL — ABNORMAL LOW (ref 8.9–10.3)
Chloride: 89 mmol/L — ABNORMAL LOW (ref 98–111)
Creatinine, Ser: 0.6 mg/dL (ref 0.44–1.00)
GFR calc Af Amer: >60 mL/min (ref >60)
GFR calc non Af Amer: >60 mL/min (ref >60)
Glucose, Bld: 148 mg/dL — ABNORMAL HIGH (ref 70–99)
Potassium: 4 mmol/L (ref 3.5–5.1)
Sodium: 134 mmol/L — ABNORMAL LOW (ref 135–145)

## 2019-09-10 LAB — GLUCOSE, CAPILLARY
Glucose-Capillary: 136 mg/dL — ABNORMAL HIGH (ref 70–99)
Glucose-Capillary: 136 mg/dL — ABNORMAL HIGH (ref 70–99)
Glucose-Capillary: 260 mg/dL — ABNORMAL HIGH (ref 70–99)
Glucose-Capillary: 163 mg/dL — ABNORMAL HIGH (ref 70–99)

## 2019-09-10 LAB — PROCALCITONIN: Procalcitonin: <0.1 ng/mL

## 2019-09-10 LAB — COOXEMETRY PANEL
Carboxyhemoglobin: 1.5 % (ref 0.5–1.5)
Methemoglobin: 1 % (ref 0.0–1.5)
O2 Saturation: 60 %
Total hemoglobin: 13 g/dL (ref 12.0–16.0)

## 2019-09-10 LAB — MAGNESIUM: Magnesium: 2 mg/dL (ref 1.7–2.4)

## 2019-09-10 MED ORDER — SILDENAFIL CITRATE 20 MG PO TABS
40.0000 mg | ORAL_TABLET | Freq: Three times a day (TID) | ORAL | Status: DC
  Administered 2019-09-10 – 2019-09-29 (×53): 40 mg via ORAL
  Filled 2019-09-10 (×64): qty 2

## 2019-09-10 MED ORDER — SODIUM CHLORIDE 0.9% FLUSH: 10.0000 mL | INTRAVENOUS | Status: DC | PRN

## 2019-09-10 MED ORDER — SODIUM CHLORIDE 0.9% FLUSH
10.0000 mL | Freq: Two times a day (BID) | INTRAVENOUS | Status: DC
  Administered 2019-09-10 – 2019-09-15 (×9): 10 mL
  Administered 2019-09-15: 20 mL
  Administered 2019-09-16 – 2019-09-19 (×4): 10 mL

## 2019-09-10 MED ORDER — PANTOPRAZOLE SODIUM 40 MG PO TBEC
40.0000 mg | DELAYED_RELEASE_TABLET | Freq: Every day | ORAL | Status: DC
  Administered 2019-09-10 – 2019-09-29 (×20): 40 mg via ORAL
  Filled 2019-09-10 (×20): qty 1

## 2019-09-10 MED ORDER — MIDODRINE HCL 5 MG PO TABS
2.5000 mg | ORAL_TABLET | Freq: Three times a day (TID) | ORAL | Status: DC
  Administered 2019-09-10 – 2019-09-12 (×7): 2.5 mg via ORAL
  Filled 2019-09-10 (×7): qty 1

## 2019-09-10 MED ORDER — GUAIFENESIN-DM 100-10 MG/5ML PO SYRP
5.0000 mL | ORAL_SOLUTION | ORAL | Status: DC | PRN
  Administered 2019-09-10 – 2019-09-28 (×2): 5 mL via ORAL
  Filled 2019-09-10 (×3): qty 5

## 2019-09-10 MED ORDER — SILDENAFIL CITRATE 20 MG PO TABS
20.0000 mg | ORAL_TABLET | Freq: Three times a day (TID) | ORAL | Status: DC
  Filled 2019-09-10 (×2): qty 1

## 2019-09-10 MED ORDER — DOXYCYCLINE HYCLATE 100 MG PO TABS
100.0000 mg | ORAL_TABLET | Freq: Two times a day (BID) | ORAL | Status: AC
  Administered 2019-09-10 – 2019-09-14 (×10): 100 mg via ORAL
  Filled 2019-09-10 (×10): qty 1

## 2019-09-10 MED ORDER — CHLORHEXIDINE GLUCONATE CLOTH 2 % EX PADS
6.0000 | MEDICATED_PAD | Freq: Every day | CUTANEOUS | Status: DC
  Administered 2019-09-10 – 2019-09-18 (×10): 6 via TOPICAL

## 2019-09-10 MED ORDER — FUROSEMIDE 10 MG/ML IJ SOLN
80.0000 mg | Freq: Two times a day (BID) | INTRAMUSCULAR | Status: DC
  Filled 2019-09-10: qty 8

## 2019-09-10 MED ORDER — SODIUM CHLORIDE 0.9 % IV SOLN: 100.0000 mg | Freq: Two times a day (BID) | INTRAVENOUS | Status: DC

## 2019-09-10 MED ORDER — DIGOXIN 125 MCG PO TABS
0.1250 mg | ORAL_TABLET | Freq: Every day | ORAL | Status: DC
  Administered 2019-09-10 – 2019-09-29 (×20): 0.125 mg via ORAL
  Filled 2019-09-10 (×20): qty 1

## 2019-09-10 NOTE — Progress Notes (Signed)
Name: Felicia [name] MRN: [id number] DOB: 1970-09-30    ADMISSION DATE:  08/15/2019 CONSULTATION DATE:  09/10/2019   REFERRING MD :  Delphia Grates, triad  CHIEF COMPLAINT: Respiratory distress, worsening hypoxia  BRIEF PATIENT DESCRIPTION: 49 year old with fibrotic NSIP and severe pulmonary hypertension and chronic hypoxic respiratory failure maintained on 10 to 15 L oxygen at home, admitted with worsening hypoxia.  She follows up at [place] with Dr. Randol Kern and Dr. [name]  SIGNIFICANT EVENTS  7/28 PCCM consult 7/30 palliative care consult. Patient remains very steadfast in hoping for ongoing pulmonary improvement  8/2 slight decrease in O2 support   STUDIES:  Echocardiogram 04/2019 normal LV function, RVSP 64 , enlarged RV, bubble study negative.  PFTs 02/2017 FVC 1.25 L 36% predicted, FEV1 1.18 L 40% predicted with DLCO of 17   HRCT 02/2019 chronic fibrotic ILD progressed from 2018 with mild focus of honeycombing  Cath 3/ 2021right atrial pressure was 7, PA pressure 60/24 with a mean of 39 and a wedge of 10 her cardiac index was low at 1.91 and output of 3.89. Her PVR is thus 7.  HISTORY OF PRESENT ILLNESS: She has a history of ILD/biopsy-proven fibrotic NSIP dating back to 2010.  Serology has been negative in 2011 except for mildly elevated aldolase and she has been maintained on immunosuppressants, CellCept and prednisone, also Rituxan 6 monthly.  Follows with Dr. Randol Kern at [facility].  She has severe pulmonary hypertension, follows with Dr. [name] at [name] and is maintained on inhaled treprostinil and sildenafil.  Right heart cath from earlier this year as listed above. She reports worsening dyspnea over the past few days, a dry cough and worsening hypoxia for which she sought attention in the ED.  Reports falls, normally ambulates with a walker.  On further questioning she states that worsening may date back a few weeks Initial evaluation showed mild leukocytosis 13.8, lactate of 5.4, BNP 1982,  she required high flow nasal cannula with 20 L oxygen and 100%.  She was given Lasix , diuresed 1 L but then developed hypotension overnight and was given a 500 cc fluid bolus.  Empirically started on antibiotics  Prior [facility] records from Dr. [name] were reviewed , and prior office visit with my partner Dr. Gala Murdoch .  She has been advised weight loss prior to being listed for lung transplant. Prior hospitalization from 05/2019 and discharge summary was reviewed, treated with diuresis and systemic steroids with some improvement SUBJECTIVE:  Feels better, less short of breath.  Comfortable Remains on 30 L/min high flow nasal cannula   VITAL SIGNS: Temp:  [97.5 F (36.4 C)-97.8 F (36.6 C)] 97.6 F (36.4 C) (08/03 1055) Pulse Rate:  [60-100] 100 (08/03 1510) Resp:  [15-30] 24 (08/03 1510) BP: (94-144)/(57-109) 121/74 (08/03 1055) SpO2:  [90 %-100 %] 92 % (08/03 1510) FiO2 (%):  [80 %] 80 % (08/03 1510) Weight:  [93 kg] 93 kg (08/03 0403)  PHYSICAL EXAMINATION: General: Chronically ill-appearing obese woman, sitting up in bed, no distress on high flow nasal cannula Eyes: Pupils equal HENT: Oropharynx clear Neck: No lesions Lungs: Comfortable respiratory pattern, distant, bilateral inspiratory crackles, no wheeze Abdomen: Obese, positive bowel sounds Extremities: 1+ lower extremity edema Neuro: Awake, alert, appropriate, nonfocal     Recent Labs  Lab 09/08/19 0247 09/09/19 0715 09/10/19 0330  NA 135 138 134*  K 3.8 3.2* 4.0  CL 93* 88* 89*  CO2 30 39* 36*  BUN 30* 25* 30*  CREATININE 0.81 0.69 0.60  GLUCOSE 308*  203* 148*   Recent Labs  Lab 09/04/19 0354 09/05/19 0044 09/09/19 0715  HGB 12.5 12.6 12.7  HCT 43.5 42.6 43.6  WBC 9.9 13.4* 14.2*  PLT 237 239 178   [place] EKG SITE RITE  Result Date: 09/09/2019 If Site Rite image not attached, placement could not be confirmed due to current cardiac rhythm.   ASSESSMENT / PLAN:  Acute on chronic hypoxic respiratory failure  -baseline on 10 to 15 L of oxygen, has 2 concentrators at home. Fibrotic NSIP -anti-Jo positive, on immunosuppressants.  Question acute flare.  Being treated with corticosteroids.  I suspect that this presentation is more decompensation of her pulmonary hypertension with profound RV dysfunction as opposed to a flare of her ILD. Severe pulmonary hypertension and cor pulmonale -was on Tyvaso and sildenafil, has stopped the Tyvaso due to cough.  Question whether this is contributed in part to her decompensation, volume overload -transfer to [facility] has been discussed, this could be accomplished if transport can provide adequate oxygen delivery, either 1.00 NRB or high flow nasal cannula.  Question whether they may start IV prostanoids P - Continue diuresis  - Continue steroids - Mycophenolate  -Continue sildenafil -Take to be placed to transduce CVP and also check to oximetry to help guide future therapy -Consider transfer to [facility] per patient's request.  If this is going to happen then she will have to be able to tolerate what ever oxygen delivery system    Baltazar Apo, MD, PhD 09/10/2019, 3:43 PM [facility] [phone] or if no answer [phone]

## 2019-09-10 NOTE — Progress Notes (Signed)
At patient bedside to place PICC , patient requesting we return after she finishes breakfast.

## 2019-09-10 NOTE — Progress Notes (Signed)
PROGRESS NOTE  Felicia [name] TLX:[id number] DOB: [birth year] DOA: 08/28/2019 PCP: Aretta Nip, MD  HPI/Recap of past 24 hours: Patient was admitted to the hospital with working diagnosis of acute on chronic hypoxic respiratory failure, due to autoimmune interstitial lung disease, NSIP flare/ right heart failure/severe pulmonary hypertension/core pulmonale.  49 year old female with past medical history for nonspecific interstitial pneumonitis, autoimmune interstitial lung disease, chronic hypoxia on high flow nasal cannula at baseline, severe pulmonary hypertension, right heart failure, type 2 diabetes mellitus, DVT on Eliquis who presented with worsening dyspnea, associated with cough and worsening orthopnea.On her initial physical examination she was in respiratory distress, respiratory rate 40 breaths/min, oxygen saturation 93% on 25 L high flow nasal cannula.Her lungs had diffuse rales bilaterally. Troponin I 206-193. BNP 1982, white count 13.8, hemoglobin 13.8, hematocrit 47.6, platelets 249.SARS COVID-19 negative. Urinalysis 21-50 red cells, more than 50 white cells, specific gravity 1.012. Chest radiograph with bilateral interstitial infiltrates.EKG 89 bpm, rightward axis, prolonged QTC 500, sinus rhythm, no ST segment or T wave changes.  She started on aggressive diuresis and high dose IV steroids with improvement of her symptomatology. She has been on non rebreather 15 L/ min plus heated high flow nasal cannula 30 L/ min, and weaning off as tolerated.   Consultation wit [facility] over the phone, patient is very high risk for transfer, severe hypoxemic respiratory failure due to combination of ILD and severe pulmonary hypertension.   Echocardiogram with worsening RV systolic failure, CT chest with stable ILD pattern.   Likely hypoxic respiratory failure predominately due to worsening severe pulmonary hypertension with RV failure.   Cardiology has been  consulted due to worsening right ventricular systolic failure.  Pulmonary following.  Appreciate specialists assistance.  09/10/19: Seen and examined.  States she feels better this AM, down to 30L/min HFNC with O2 sat 94%.  States she is on bactrim 3X per week as needed for hx of recurrent sinusitis.  Held due to QTC prolongation and replaced with po doxycycline.      Assessment/Plan: Principal Problem:   Acute on chronic respiratory failure with hypoxia (HCC) Active Problems:   Acute on chronic diastolic CHF (congestive heart failure) (HCC)   Interstitial lung disease (HCC)   Hypokalemia   History of DVT (deep vein thrombosis)   Prolonged Q-T interval on ECG   Uncontrolled diabetes mellitus with hyperglycemia (HCC)   Elevated troponin   Pressure injury of skin   Palliative care by specialist  Acute on chronic hypoxic respiratory failure/autoimmune ILD NSIP/acute on chronicright heart failure with core pulmonale/severepulmonary hypertension. 2019 DLCO 17,  HRCT 2021 with progressive fibrotic ILD,  Right heart cath 04/2019 PA pressure 60/24 with mean 39. Follow up echocardiogram with severe and worsening reduction in RV systolic function, with severe elevated pulmonary artery systolic pressure, estimated 73 mmHg.  CT chest fibrosis pattern unchanged. Dyspnea improving with diuresing and steroids Currently on high flow nasal cannula down to 30L/min, continue to wean off as tolerated  Medical therapy with: IV Methylprednisolone 60 bid, continued, add po protonix 40 mg daily for GI ppx IV Furosemide 80 bid, midodrine added due to soft BPs Sildenafil, continued Mycophenolate, continued  Acute on chronic dCHF Elevated BNP, fluid overload, pulmonary edema Ongoing diuresing Advanced heart failure team assisting PICC line to monitor CVP IV lasix 80 BID, midodrine for BP support Net I&O -5.4L Continue strict I&O and daily weight  Severe pulmonary hypertension/cor  pulmonale Worsening right heart failure on 2D echo done on 09/05/2019 Currently on  IV Lasix, sildenafil and low-dose atenolol 12.5 mg twice daily Cardiology/advanced heart failure team assisting with management Digoxin added Continue strict I's and O's and daily weight Net I&O -5.3 L  Autoimmune interstitial lung disease/nonspecific interstitial pneumonitis Follows with pulmonary at [facility] High risk for decompensation Currently on CellCept, IV Lasix and IV Solu-Medrol Pulmonary following, appreciate recommendations  Hypokalemia/ hyponatremia/ hypomagnesemia, diuretic induced. Renal function stable with serum cr at 0.69 Serum potassium 3.2, repleted orally Serum magnesium 2.0, at goal.   UncontrolledT2DMwith Hgb A1c 8,8 with hyperglycemia, exacerbated by IV steroids Currently on IV Solu-Medrol 60 twice daily Increase Lantus to 35 units nightly Add NovoLog 3 units before meals Continue insulin sliding scale  DVT. Diagnosed May 14, 2019  Continue Eliquis  Prolonged QTc. Twelve-lead EKG done on 09/05/2019, QTC 568 Avoid QTC prolonging agents Repeat twelve-lead EKG on 09/09/2019 Continue to closely monitor on progressive unit.    Left ear discomfort/recurrent sinusitis She is on Bactrim prophylactically Monday Wednesday Friday Bactrim on hold due to prolonged QTC Continue doxycycline 20 mg twice daily x5 days.  Obesity.  BMI 32  Weight loss outpatient once stable  Stage 2 pressure ulcer at the buttocks.Present on admission,Continue with local wound care.  Chronic depression/anxiety Stable Continue Lexapro  Chronic constipation Continue bowel regimen  Patient continue to be at high risk for worsening hypoxic respiratory failure   Status is: Inpatient  Remains inpatient appropriate because:IV treatments appropriate due to intensity of illness or inability to take PO   Dispo: The patient is from: Home  Anticipated d/c is to:  Home  Anticipated d/c date is: 09/12/19  Patient currently is not medically stable to d/c.   Ongoing treatment for severe right heart failure.  DVT prophylaxis:      apixaban   Code Status:              full  Family Communication:  None at bedside.   Nutrition Status:     Skin Documentation: Pressure Injury 09/04/19 Buttocks Left Stage 2 -  Partial thickness loss of dermis presenting as a shallow open injury with a red, pink wound bed without slough. small circular size of pencil (Active)  09/04/19 0200  Location: Buttocks  Location Orientation: Left  Staging: Stage 2 -  Partial thickness loss of dermis presenting as a shallow open injury with a red, pink wound bed without slough.  Wound Description (Comments): small circular size of pencil  Present on Admission: Yes     Consultants:   Pulmonary   Cardiology  Advanced heart team.     Objective: Vitals:   09/10/19 0404 09/10/19 0734 09/10/19 0800 09/10/19 1055  BP: 103/74  110/83 121/74  Pulse: 65  80 99  Resp: _0 Temp: 97.7 F (36.5 C)  97.7 F (36.5 C) 97.6 F (36.4 C)  TempSrc: Oral  Oral Oral  SpO2: 97% 97% 91% 92%  Weight:        Intake/Output Summary (Last 24 hours) at 09/10/2019 1400 Last data filed at 09/10/2019 0800 Gross per 24 hour  Intake 240 ml  Output 1100 ml  Net -860 ml   Filed Weights   09/07/19 0500 09/09/19 0500 09/10/19 0403  Weight: 86.9 kg 89 kg 93 kg    Exam:  . General: 49 y.o. year-old female well-developed well-nourished no acute distress.  Alert and oriented x3.   . Cardiovascular: Regular rate and rhythm no rubs or gallops.   [name] Respiratory: Mild diffuse rales bilaterally no wheezing noted.  Poor inspiratory effort.   . Abdomen: Soft nontender normal bowel sounds present. . Musculoskeletal: No lower extremity edema bilaterally.   [name] Psychiatry: Mood is appropriate for condition and setting.  Data Reviewed: CBC: Recent Labs  Lab  08/18/2019 2141 09/04/2019 2224 09/04/19 0354 09/05/19 0044 09/09/19 0715  WBC 13.8*  --  9.9 13.4* 14.2*  NEUTROABS  --   --   --   --  12.9*  HGB 13.8 16.0* 12.5 12.6 12.7  HCT 47.6* 47.0* 43.5 42.6 43.6  MCV 86.5  --  84.1 83.0 83.4  PLT 259  --  237 239 016   Basic Metabolic Panel: Recent Labs  Lab 08/28/2019 2141 09/05/2019 2224 09/05/19 0044 09/05/19 1701 09/06/19 0437 09/07/19 0200 09/08/19 0247 09/09/19 0715 09/10/19 0330  NA 138   < > 139   < > 138 132* 135 138 134*  K 3.3*   < > 2.9*   < > 4.4 4.4 3.8 3.2* 4.0  CL 90*   < > 93*   < > 96* 94* 93* 88* 89*  CO2 27   < > 32   < > _0 39* 36*  GLUCOSE 455*   < > 228*   < > 188* 317* 308* 203* 148*  BUN 28*   < > 34*   < > 36* 36* 30* 25* 30*  CREATININE 1.09*   < > 1.11*   < > 0.99 0.85 0.81 0.69 0.60  CALCIUM 9.4   < > 8.6*   < > 8.4* 8.3* 8.3* 8.6* 8.7*  MG 2.0   < > 1.8  --  2.2 2.2  --  2.0 2.0  PHOS 4.5  --   --   --   --   --   --  2.9  --    < > = values in this interval not displayed.   GFR: Estimated Creatinine Clearance: 95.9 mL/min (by C-G formula based on SCr of 0.6 mg/dL). Liver Function Tests: Recent Labs  Lab 08/26/2019 2141 09/09/19 0715  AST 25 23  ALT 29 56*  ALKPHOS 80 196*  BILITOT 1.6* 1.2  PROT 5.6* 5.9*  ALBUMIN 3.5 3.6   No results for input(s): LIPASE, AMYLASE in the last 168 hours. No results for input(s): AMMONIA in the last 168 hours. Coagulation Profile: Recent Labs  Lab 08/25/2019 2141  INR 1.5*   Cardiac Enzymes: No results for input(s): CKTOTAL, CKMB, CKMBINDEX, TROPONINI in the last 168 hours. BNP (last 3 results) No results for input(s): PROBNP in the last 8760 hours. HbA1C: No results for input(s): HGBA1C in the last 72 hours. CBG: Recent Labs  Lab 09/09/19 1122 09/09/19 1640 09/09/19 2200 09/10/19 0610 09/10/19 1107  GLUCAP 243* 223* 170* 136*  136* 260*   Lipid Profile: No results for input(s): CHOL, HDL, LDLCALC, TRIG, CHOLHDL, LDLDIRECT in the last 72  hours. Thyroid Function Tests: No results for input(s): TSH, T4TOTAL, FREET4, T3FREE, THYROIDAB in the last 72 hours. Anemia Panel: No results for input(s): VITAMINB12, FOLATE, FERRITIN, TIBC, IRON, RETICCTPCT in the last 72 hours. Urine analysis:    Component Value Date/Time   COLORURINE AMBER (A) 09/04/2019 0744   APPEARANCEUR CLOUDY (A) 09/04/2019 0744   LABSPEC 1.012 09/04/2019 0744   PHURINE 6.0 09/04/2019 0744   GLUCOSEU >=500 (A) 09/04/2019 0744   HGBUR SMALL (A) 09/04/2019 0744   BILIRUBINUR NEGATIVE 09/04/2019 0744   KETONESUR NEGATIVE 09/04/2019 0744   PROTEINUR 30 (A) 09/04/2019 0744   UROBILINOGEN 0.2  12/24/2008 1604   NITRITE POSITIVE (A) 09/04/2019 0744   LEUKOCYTESUR LARGE (A) 09/04/2019 0744   Sepsis Labs: _0 (procalcitonin:4,lacticidven:4)  ) Recent Results (from the past 240 hour(s))  SARS Coronavirus 2 by RT PCR (hospital order, performed in [facility] hospital lab) Nasopharyngeal Nasopharyngeal Swab     Status: None   Collection Time: 09/02/2019 11:01 PM   Specimen: Nasopharyngeal Swab  Result Value Ref Range Status   SARS Coronavirus 2 NEGATIVE NEGATIVE Final    Comment: (NOTE) SARS-CoV-2 target nucleic acids are NOT DETECTED.  The SARS-CoV-2 RNA is generally detectable in upper and lower respiratory specimens during the acute phase of infection. The lowest concentration of SARS-CoV-2 viral copies this assay can detect is 250 copies / mL. A negative result does not preclude SARS-CoV-2 infection and should not be used as the sole basis for treatment or other patient management decisions.  A negative result may occur with improper specimen collection / handling, submission of specimen other than nasopharyngeal swab, presence of viral mutation(s) within the areas targeted by this assay, and inadequate number of viral copies (<250 copies / mL). A negative result must be combined with clinical observations, patient history, and epidemiological  information.  Fact Sheet for Patients:   StrictlyIdeas.no  Fact Sheet for Healthcare Providers: BankingDealers.co.za  This test is not yet approved or  cleared by the [place] FDA and has been authorized for detection and/or diagnosis of SARS-CoV-2 by FDA under an Emergency Use Authorization (EUA).  This EUA will remain in effect (meaning this test can be used) for the duration of the COVID-19 declaration under Section 564(b)(1) of the Act, 21 U.S.C. section 360bbb-3(b)(1), unless the authorization is terminated or revoked sooner.  Performed at [facility], [place] [address], [place], [place] [address]   Culture, blood (routine x 2)     Status: None   Collection Time: 09/04/19 12:47 AM   Specimen: BLOOD RIGHT HAND  Result Value Ref Range Status   Specimen Description BLOOD RIGHT HAND  Final   Special Requests   Final    BOTTLES DRAWN AEROBIC AND ANAEROBIC Blood Culture adequate volume   Culture   Final    NO GROWTH 5 DAYS Performed at [facility], [place] [address] [place], [place] [address]    Report Status 09/09/2019 FINAL  Final  Culture, blood (routine x 2)     Status: None   Collection Time: 09/04/19  3:53 AM   Specimen: BLOOD  Result Value Ref Range Status   Specimen Description BLOOD LEFT ARM  Final   Special Requests   Final    BOTTLES DRAWN AEROBIC AND ANAEROBIC Blood Culture adequate volume   Culture   Final    NO GROWTH 5 DAYS Performed at [facility] [address] [place], [place] [address]    Report Status 09/09/2019 FINAL  Final  Urine culture     Status: Abnormal   Collection Time: 09/04/19  7:44 AM   Specimen: Urine, Random  Result Value Ref Range Status   Specimen Description URINE, RANDOM  Final   Special Requests   Final    NONE Performed at [facility], [place] [address], [place],  [address]    Culture MULTIPLE SPECIES PRESENT, SUGGEST RECOLLECTION (A)  Final    Report Status 09/05/2019 FINAL  Final  MRSA PCR Screening     Status: None   Collection Time: 09/04/19  9:15 AM   Specimen: Nasal Mucosa; Nasopharyngeal  Result Value Ref Range Status   MRSA by PCR NEGATIVE NEGATIVE Final    Comment:        The GeneXpert MRSA Assay (FDA approved for NASAL specimens only), is one component of a comprehensive MRSA colonization surveillance program. It is not intended to diagnose MRSA infection nor to guide or monitor treatment for MRSA infections. Performed at [facility], [place] [address] [place], [place] [address]       Studies: [place] EKG SITE RITE  Result Date: 09/09/2019 If [facility] image not attached, placement could not be confirmed due to current cardiac rhythm.   Scheduled Meds: . apixaban  5 mg Oral BID  . atenolol  12.5 mg Oral BID  . bisacodyl  10 mg Rectal Once  . Chlorhexidine Gluconate Cloth  6 each Topical Daily  . digoxin  0.125 mg Oral Daily  . doxycycline  100 mg Oral Q12H  . escitalopram  10 mg Oral Daily  . furosemide  80 mg Intravenous BID  . insulin aspart  0-15 Units Subcutaneous TID WC  . insulin aspart  0-5 Units Subcutaneous QHS  . insulin aspart  3 Units Subcutaneous TID WC  . insulin glargine  35 Units Subcutaneous QHS  . ipratropium-albuterol  3 mL Nebulization TID  . methylPREDNISolone (SOLU-MEDROL) injection  60 mg Intravenous Q12H  . midodrine  2.5 mg Oral TID WC  . mycophenolate  1,500 mg Oral BID  . polyethylene glycol  17 g Oral Daily  . Ensure Max Protein  11 oz Oral Daily  . senna-docusate  2 tablet Oral BID  . sildenafil  40 mg Oral TID  . sodium chloride flush  10-40 mL Intracatheter Q12H  . sodium chloride flush  3 mL Intravenous Q12H    Continuous Infusions: . sodium chloride 10 mL/hr at 09/05/19 0253     LOS: 7 days     Kayleen Memos, MD [facility] Pager [phone]  If 7PM-7AM, please contact night-coverage [email or web address] Password [email or web address] 09/10/2019, 2:00 PM

## 2019-09-10 NOTE — Progress Notes (Signed)
   09/10/19 1646  Clinical Encounter Type  Visited With Patient  Visit Type Initial;Spiritual support  Referral From Palliative care team  Consult/Referral To Chaplain  This chaplain responded to PMT referral for spiritual care.  After checking in with the Pt. RN-Blaire, the chaplain introduced to herself to the Pt. The chaplain listened as the Pt. Shared and connected the medical events of the day to her path of serious illness.  The chaplain understands the Pt. wants to speak of her illness with friends and family with specificity and clarity.  The chaplain accepted the Pt. request for assistance in identifying a quality of life that meets the meaning of life she is discerning. The chaplain and Pt. agreed upon F/U spiritual care as needed.

## 2019-09-10 NOTE — Progress Notes (Signed)
See HF/PAH consult noted completed today and started 09/09/19.   Keoni Havey NP-C  12:49 PM

## 2019-09-10 NOTE — Progress Notes (Signed)
Patient ID: Felicia [name], female   DOB: [birth year], 49 y.o.   MRN: [id number]  This NP reviewed medical records, received report from team and then visited patient at the bedside as a follow up for palliative medicine needs and emotional support.  Patient was initially seen by the palliative medicine team on 09/06/2019.  Today is day 6 of this hospital stay, this is her 3 admission in past 5 months.  Patient remains on high flow oxygen, continues to desaturate on minimal exertion.  HF team consulted, PICC line placed for monitoring coox and possible milrinone  She is high risk for decompensation secondary to her multiple comorbidities.  Created space and opportunity for patient to explore her thoughts and feelings regarding her current medical situation. Patient is open and shares the physical, emotional and financial strains of living with serious life limiting illness. Emotional support offered..  Education offered regarding mind-body spirit connection.  Education offered regarding importance of continued function and mobility within the limitations of her disease.  Education offered on active range of motion movements.  Patient is open to all offered and available medical interventions to prolong life.  Ultimately she is hopeful for transplant.  Discussed with patient the importance of continued conversation with family and the medical providers regarding overall plan of care and treatment options,  ensuring decisions are within the context of the patients values and GOCs.  Encourage documentation of advanced directives and healthcare power of attorney with the assistance of our spiritual care department while here in the hospital.  Questions and concerns addressed   PMT will continue to support holistically  Total time spent on the unit was 35 minutes  Greater than 50% of the time was spent in counseling and coordination of care  Wadie Lessen NP  Palliative Medicine Team Team Phone  # [phone] Pager [phone]

## 2019-09-10 NOTE — Progress Notes (Signed)
PICC placed. CVP 6. CO-OX stable-->60%. Does not need milrinone.    Stop IV lasix. Continue sildenafil 40 mg three times a day + midodrine.    Shana Zavaleta  NP-C  4:53 PM

## 2019-09-10 NOTE — Progress Notes (Signed)
Peripherally Inserted Central Catheter Placement  The IV Nurse has discussed with the patient and/or persons authorized to consent for the patient, the purpose of this procedure and the potential benefits and risks involved with this procedure.  The benefits include less needle sticks, lab draws from the catheter, and the patient may be discharged home with the catheter. Risks include, but not limited to, infection, bleeding, blood clot (thrombus formation), and puncture of an artery; nerve damage and irregular heartbeat and possibility to perform a PICC exchange if needed/ordered by physician.  Alternatives to this procedure were also discussed.  Bard Power PICC patient education guide, fact sheet on infection prevention and patient information card has been provided to patient /or left at bedside.    PICC Placement Documentation  PICC Double Lumen 09/10/19 PICC Right Brachial 39 cm 0 cm (Active)  Indication for Insertion or Continuance of Line Chronic illness with exacerbations (CF, Sickle Cell, etc.) 09/10/19 1200  Exposed Catheter (cm) 0 cm 09/10/19 1200  Site Assessment Clean;Dry;Intact 09/10/19 1200  Lumen #1 Status Flushed;Saline locked;Blood return noted 09/10/19 1200  Lumen #2 Status Flushed;Saline locked;Blood return noted 09/10/19 1200  Dressing Type Transparent;Securing device 09/10/19 1200  Dressing Status Clean;Dry;Intact;Antimicrobial disc in place 09/10/19 1200  Dressing Change Due 09/17/19 09/10/19 1200       Holley Bouche Renee 09/10/2019, 12:36 PM

## 2019-09-11 ENCOUNTER — Inpatient Hospital Stay (HOSPITAL_COMMUNITY): Payer: 59

## 2019-09-11 LAB — MAGNESIUM: Magnesium: 1.9 mg/dL (ref 1.7–2.4)

## 2019-09-11 LAB — CBC WITH DIFFERENTIAL/PLATELET
Abs Immature Granulocytes: 0.57 10*3/uL — ABNORMAL HIGH (ref 0.00–0.07)
Basophils Absolute: 0 10*3/uL (ref 0.0–0.1)
Basophils Relative: 0 %
Eosinophils Absolute: 0 10*3/uL (ref 0.0–0.5)
Eosinophils Relative: 0 %
HCT: 42.2 % (ref 36.0–46.0)
Hemoglobin: 12.2 g/dL (ref 12.0–15.0)
Immature Granulocytes: 3 %
Lymphocytes Relative: 1 %
Lymphs Abs: 0.2 10*3/uL — ABNORMAL LOW (ref 0.7–4.0)
MCH: 24.4 pg — ABNORMAL LOW (ref 26.0–34.0)
MCHC: 28.9 g/dL — ABNORMAL LOW (ref 30.0–36.0)
MCV: 84.2 fL (ref 80.0–100.0)
Monocytes Absolute: 1 10*3/uL (ref 0.1–1.0)
Monocytes Relative: 5 %
Neutro Abs: 16.6 10*3/uL — ABNORMAL HIGH (ref 1.7–7.7)
Neutrophils Relative %: 91 %
Platelets: 204 10*3/uL (ref 150–400)
RBC: 5.01 MIL/uL (ref 3.87–5.11)
RDW: 16.5 % — ABNORMAL HIGH (ref 11.5–15.5)
WBC: 18.5 10*3/uL — ABNORMAL HIGH (ref 4.0–10.5)
nRBC: 0.1 % (ref 0.0–0.2)

## 2019-09-11 LAB — GLUCOSE, CAPILLARY
Glucose-Capillary: 167 mg/dL — ABNORMAL HIGH (ref 70–99)
Glucose-Capillary: 261 mg/dL — ABNORMAL HIGH (ref 70–99)
Glucose-Capillary: 147 mg/dL — ABNORMAL HIGH (ref 70–99)

## 2019-09-11 LAB — BASIC METABOLIC PANEL
Anion gap: 11 (ref 5–15)
BUN: 30 mg/dL — ABNORMAL HIGH (ref 6–20)
CO2: 36 mmol/L — ABNORMAL HIGH (ref 22–32)
Calcium: 9.2 mg/dL (ref 8.9–10.3)
Chloride: 89 mmol/L — ABNORMAL LOW (ref 98–111)
Creatinine, Ser: 0.64 mg/dL (ref 0.44–1.00)
GFR calc Af Amer: >60 mL/min (ref >60)
GFR calc non Af Amer: >60 mL/min (ref >60)
Glucose, Bld: 167 mg/dL — ABNORMAL HIGH (ref 70–99)
Potassium: 3.9 mmol/L (ref 3.5–5.1)
Sodium: 136 mmol/L (ref 135–145)

## 2019-09-11 LAB — COOXEMETRY PANEL
Carboxyhemoglobin: 1.3 % (ref 0.5–1.5)
Methemoglobin: 0.9 % (ref 0.0–1.5)
O2 Saturation: 57 %
Total hemoglobin: 13.5 g/dL (ref 12.0–16.0)

## 2019-09-11 MED ORDER — FUROSEMIDE 10 MG/ML IJ SOLN
40.0000 mg | Freq: Two times a day (BID) | INTRAMUSCULAR | Status: DC
  Administered 2019-09-11 – 2019-09-12 (×2): 40 mg via INTRAVENOUS
  Filled 2019-09-11 (×2): qty 4

## 2019-09-11 MED ORDER — POLYETHYLENE GLYCOL 3350 17 G PO PACK: 17.0000 g | PACK | Freq: Two times a day (BID) | ORAL | 0 refills | Status: AC

## 2019-09-11 MED ORDER — MIDODRINE HCL 2.5 MG PO TABS: 2.5000 mg | ORAL_TABLET | Freq: Three times a day (TID) | ORAL | 0 refills | Status: AC

## 2019-09-11 MED ORDER — ADULT MULTIVITAMIN W/MINERALS CH
1.0000 | ORAL_TABLET | Freq: Every day | ORAL | Status: DC
  Administered 2019-09-12 – 2019-09-29 (×16): 1 via ORAL
  Filled 2019-09-11 (×17): qty 1

## 2019-09-11 MED ORDER — PANTOPRAZOLE SODIUM 40 MG PO TBEC: 40.0000 mg | DELAYED_RELEASE_TABLET | Freq: Every day | ORAL | 0 refills | Status: AC

## 2019-09-11 MED ORDER — DIGOXIN 125 MCG PO TABS: 0.1250 mg | ORAL_TABLET | Freq: Every day | ORAL | 0 refills | Status: AC

## 2019-09-11 MED ORDER — SILDENAFIL CITRATE 20 MG PO TABS: 40.0000 mg | ORAL_TABLET | Freq: Three times a day (TID) | ORAL | 0 refills | Status: AC

## 2019-09-11 MED ORDER — DOXYCYCLINE HYCLATE 100 MG PO TABS: 100.0000 mg | ORAL_TABLET | Freq: Two times a day (BID) | ORAL | 0 refills | Status: AC

## 2019-09-11 MED ORDER — BISACODYL 10 MG RE SUPP
10.0000 mg | Freq: Once | RECTAL | Status: AC
  Administered 2019-09-11: 10 mg via RECTAL
  Filled 2019-09-11: qty 1

## 2019-09-11 MED ORDER — POLYETHYLENE GLYCOL 3350 17 G PO PACK
17.0000 g | PACK | Freq: Two times a day (BID) | ORAL | Status: DC
  Administered 2019-09-16 – 2019-09-28 (×5): 17 g via ORAL
  Filled 2019-09-11 (×17): qty 1

## 2019-09-11 MED ORDER — ENSURE MAX PROTEIN PO LIQD: 11.0000 [oz_av] | Freq: Every day | ORAL | 0 refills | Status: AC

## 2019-09-11 MED ORDER — HYDROCORTISONE (PERIANAL) 2.5 % EX CREA
TOPICAL_CREAM | CUTANEOUS | Status: DC | PRN
  Filled 2019-09-11: qty 28.35

## 2019-09-11 MED ORDER — ENSURE MAX PROTEIN PO LIQD
11.0000 [oz_av] | Freq: Two times a day (BID) | ORAL | Status: DC
  Administered 2019-09-11 – 2019-09-28 (×27): 11 [oz_av] via ORAL
  Filled 2019-09-11 (×37): qty 330

## 2019-09-11 MED ORDER — FUROSEMIDE 10 MG/ML IJ SOLN
40.0000 mg | Freq: Once | INTRAMUSCULAR | Status: AC
  Administered 2019-09-11: 40 mg via INTRAVENOUS
  Filled 2019-09-11: qty 4

## 2019-09-11 MED ORDER — ATENOLOL 25 MG PO TABS: 12.5000 mg | ORAL_TABLET | Freq: Two times a day (BID) | ORAL | 0 refills | Status: AC

## 2019-09-11 NOTE — Progress Notes (Signed)
PT Cancellation Note  Patient Details Name: Marco Adelson MRN: [id number] DOB: 12/28/1970   Cancelled Treatment:    Reason Eval/Treat Not Completed: Other (comment) Checked on pt several times in the afternoon.  She reports had suppository and having BM.  Request PT tomorrow. Abran Richard, PT Acute Rehab Services Pager [phone] [name] [place] 09/11/2019, 5:02 PM

## 2019-09-11 NOTE — Progress Notes (Signed)
PROGRESS NOTE  Felicia [name] EPP:[id number] DOB: April 21, 1970 DOA: 08/17/2019 PCP: Aretta Nip, MD  HPI/Recap of past 24 hours: Patient was admitted to the hospital with working diagnosis of acute on chronic hypoxic respiratory failure, due to exacerbation of her autoimmune interstitial lung disease, NSIP flare, worsening right heart failure, and severe pulmonary hypertension with core pulmonale.  49 year old female with past medical history significant for nonspecific interstitial pneumonitis, autoimmune interstitial lung disease, chronic hypoxia on 2 concentrators 15L at baseline, severe pulmonary hypertension, right heart failure, type 2 diabetes mellitus, DVT on Eliquis who presented to [facility] with worsening dyspnea, associated with cough and orthopnea.On her initial physical examination she was in respiratory distress, respiratory rate 40 breaths/min, oxygen saturation 93% on 25 L high flow nasal cannula.Her lungs had diffuse rales bilaterally. Troponin I 206-193. BNP 1982, white count 13.8K, hemoglobin 13.8K, platelets 249K.SARS COVID-19 negative. Urinalysis 21-50 red cells, more than 50 white cells, specific gravity 1.012. Chest radiograph with bilateral interstitial infiltrates.EKG 89 bpm, rightward axis, prolonged QTC 500, sinus rhythm, no specific ST T changes.  Was started on aggressive diuresis and high dose IV steroids with improvement of her symptomatology. She initially required non rebreather 15 L/ min plus heated high flow nasal cannula 30 L/ min at 100% FiO2, she is being weaned as tolerated.   2D Echocardiogram done on 09/05/19 showed worsening RV systolic failure with severely reduced RV which has progressed from previous echo in March 2021.  Cardiology/Advanced Heart Failure team was consulted and assisting with the management.   CT chest done on 09/05/19 w/o contrast showed stable ILD pattern.Pulmonary was consulted and assisting with the  management.  Likely acute on chronic hypoxic respiratory failure is multifactorial 2/2 to profound RV dysfunction, acute on chronic cor pulmonale, severe pulmonary hypertension, and flare of NSIP.  Appreciate specialists assistance.  Patient requests transfer to [facility].  Her pulmonologist and cardiologist are from [name].  09/11/19: Seen and examined.She is on HFNC 30L/min at 80% FiO2 and NRB at her mouth with O2 saturation on the monitor in the room at 94%.  States breathing is improved.  Elevated CVP this AM 10-11, diuretic IV lasix 80 mg BID was held yesterday when CVP was 5-6. Reports abdominal discomfort and intermittent nausea with no vomiting.  Port abdominal xray and CXR obtained this morning: No evidence of bowel obstruction, moderate colonic stool burden. Unchanged findings of pulmonary fibrosis. No new focal consolidation identified.  Assessment/Plan: Principal Problem:   Acute on chronic respiratory failure with hypoxia (HCC) Active Problems:   Acute on chronic diastolic CHF (congestive heart failure) (HCC)   DNR (do not resuscitate) discussion   Interstitial lung disease ([place])   Hypokalemia   History of DVT (deep vein thrombosis)   Prolonged Q-T interval on ECG   Uncontrolled diabetes mellitus with hyperglycemia (HCC)   Elevated troponin   Pressure injury of skin   Palliative care by specialist   Hypoxia   Acute on chronic hypoxic respiratory failure, likely                                              multifactorial 2/2 to profound RV dysfunction, acute on  chronic cor pulmonale, severe pulmonary hypertension, and                             flare of NSIP. 2019 DLCO 17,  HRCT 2021 with progressive fibrotic ILD,  Right heart cath 04/2019 PA pressure 60/24 with mean 39. Follow up echocardiogram with severe and worsening reduction in RV systolic function, with severe elevated pulmonary artery systolic pressure, estimated 73 mmHg.  CT  chest fibrosis pattern unchanged. Dyspnea improving with high doses IV diuretics and high doses IV steroids Currently on high flow nasal cannuladown to 30L/min with FiO2 80% coupled with a 60% NRB.  She uses NRB at her convenience whether she feels dyspneic or not.  Continue to wean off as tolerated.  Discussed with Dr. Delight Hoh at [facility] (09/11/19) regarding transfer, they are willing to consider.  Per transfer center coordinator, Rise Paganini, transportation can provide HFNC at her current setting 30L/min at 80% FiO2.  They will check to see if they can also couple with NRB if needed for transport.  The drive is close to 2 hours from [facility] depending on traffic.  Medical therapy with: IVMethylprednisolone60 bid, continued, po protonix 40 mg daily for GI ppx IVFurosemide80 bid held on 8/3 due to CVP 5-6,midodrine added due to soft Bps CVP 10-11 on 09/11/19, defer to cardiology/ Advanced heart failure team to resume diuretics Sildenafil, continued, dose increased to 40 mg TID on 8/4 Mycophenolate, continued  Acute on chronic dCHF Elevated BNP, fluid overload, pulmonary edema Diuresing deferred to advanced heart failure team PICC line in place to monitor CVP, current CVP is elevated 10-11 IV lasix 80 BID on hold yesterday with CVP 5-6, midodrine for BP support Net I&O -6.9L On Atenolol Continue strict I&O and daily weight  Severe pulmonary hypertension/cor pulmonale Worsening right heart failure on 2D echo done on 09/05/2019 Received aggressive diuresing with IV Lasix 80 mg BID Continue sildenafil, dose increased, and low-dose atenolol 12.5 mg twice daily                           Was previously on Tyvaso for PAH, stopped due to cough Cardiology/advanced heart failure team assistingwith management Continue Digoxin added 8/3 Follows with cardiology at [facility]  Autoimmune interstitial lung disease/nonspecific interstitial pneumonitis Follows with pulmonary at [facility]  risk for decompensation Currently on CellCept and IV Solu-Medrol Pulmonary/Critical care medicine following, appreciate recommendations  Resolved post repletion: Hypokalemia/hyponatremia/hypomagnesemia, diuretic induced.Renal function stable with serum cr at 0.64 and GFR >60 Serum potassium 3.9. Serum magnesium 1.9.  Monitor electrolytes and replete as indicated  UncontrolledT2DMwith Hgb A1c 8,8 with hyperglycemia, likely exacerbated by IV steroids Currently on IV Solu-Medrol 60 mg twice daily Increase Lantus to 35 units nightly Continue NovoLog 3 units before meals Continue insulin sliding scale  History of DVT Diagnosed May 14, 2019  Continue Eliquis  Prolonged QTc. Twelve-lead EKG done on 09/05/2019, QTC 568 Repeat 12 lead EKG on 09/10/19 QTC 606 Avoid QTC prolonging agents Repeat twelve-lead EKG on 09/11/2019 Continue to closely monitor on progressive unit.   Left ear discomfort/recurrent sinusitis She is on Bactrim prophylactically Monday Wednesday Friday Bactrim on hold due to prolonged QTC Continuedoxycycline100 mg twice daily x5days.  Obesity.  BMI 32  Weight loss outpatient once stable  Stage 2 pressure ulcer at the buttocks.Present on admission,Continue local wound care.  Chronic depression/anxiety Stable Continue Lexapro  Chronic constipation Continue bowel regimen senokot  2 tablets BID, Miralax 17 g BID Added supp dulcolax once if needed Moderate stool burden on abd xray If no improvement, consider smog enema  Goals of care PMT consulted to assist with establishing goals of care She is at high risk for decompensation due to her multiple comorbidities                           Per PMT, patient is open to all offered and available medical                                     interventions to prolong life. Ultimately she is hopeful for                                           transplant. Patient is full code    Patient continue  to be at high risk forworsening hypoxic respiratory failure  Status is: Inpatient  Remains inpatient appropriate because:IV treatments appropriate due to intensity of illness.   Dispo: The patient is from:Home Anticipated d/c is GY:BWLSLHTD to [facility] center where she receives her pulmonary and cardiac care. Anticipated d/c date is: 09/11/19 Patient currently is not medically stable to d/c.Ongoing treatment for severe right heart failure and severe acute on chronic hypoxic respiratory failure requiring HFNC.  DVT prophylaxis:apixaban Code Status:fullcode Family Communication:None at bedside.   Nutrition Status:    Skin Documentation: Pressure Injury 09/04/19 Buttocks Left Stage 2 - Partial thickness loss of dermis presenting as a shallow open injury with a red, pink wound bed without slough. small circular size of pencil (Active)  09/04/19 0200  Location: Buttocks  Location Orientation: Left  Staging: Stage 2 - Partial thickness loss of dermis presenting as a shallow open injury with a red, pink wound bed without slough.  Wound Description (Comments): small circular size of pencil  Present on Admission: Yes     Consultants:  Pulmonary  Cardiology  Advanced heart team.         Procedures:  PICC line placement on 09/10/19 for CVP monitoring  Consultations:  Cardiology  Advanced heart failure team  Pulmonary/critical care medicine  Palliative care team       Objective: Vitals:   09/11/19 0820 09/11/19 1114 09/11/19 1115 09/11/19 1138  BP: 108/87 101/81 101/81   Pulse: 92 88 88 90  Resp: (!) 21 20 (!) 22 (!) 24  Temp: (!) 97.5 F (36.4 C) (!) 96.4 F (35.8 C) 97.6 F (36.4 C)   TempSrc: Oral Axillary Oral   SpO2: 95% 96% 96% 97%  Weight:      Height:        Intake/Output Summary (Last 24 hours) at 09/11/2019 1243 Last data filed at 09/11/2019  1115 Gross per 24 hour  Intake 240 ml  Output 1700 ml  Net -1460 ml   Filed Weights   09/09/19 0500 09/10/19 0403 09/11/19 0448  Weight: 89 kg 93 kg 86.2 kg    Exam:   General: 49 y.o. year-old female pleasant well-developed well-nourished in no acute distress.  Alert and oriented x3.    Cardiovascular: Regular rate and rhythm no rubs or gallops.  Respiratory: Clear to auscultation no wheezes no rales.    Abdomen: Soft nontender hypoactive bowel sounds present.  Musculoskeletal: No lower extremity edema bilaterally.[name]  Psychiatry: Mood is appropriate for condition and setting   Data Reviewed: CBC: Recent Labs  Lab 09/05/19 0044 09/09/19 0715 09/10/19 1522 09/11/19 0631  WBC 13.4* 14.2* 18.1* 18.5*  NEUTROABS  --  12.9* 16.3* 16.6*  HGB 12.6 12.7 12.3 12.2  HCT 42.6 43.6 42.3 42.2  MCV 83.0 83.4 83.6 84.2  PLT 239 178 192 056   Basic Metabolic Panel: Recent Labs  Lab 09/06/19 0437 09/06/19 0437 09/07/19 0200 09/08/19 0247 09/09/19 0715 09/10/19 0330 09/11/19 0526  NA 138   < > 132* 135 138 134* 136  K 4.4   < > 4.4 3.8 3.2* 4.0 3.9  CL 96*   < > 94* 93* 88* 89* 89*  CO2 29   < > 24 30 39* 36* 36*  GLUCOSE 188*   < > 317* 308* 203* 148* 167*  BUN 36*   < > 36* 30* 25* 30* 30*  CREATININE 0.99   < > 0.85 0.81 0.69 0.60 0.64  CALCIUM 8.4*   < > 8.3* 8.3* 8.6* 8.7* 9.2  MG 2.2  --  2.2  --  2.0 2.0 1.9  PHOS  --   --   --   --  2.9  --   --    < > = values in this interval not displayed.   GFR: Estimated Creatinine Clearance: 92.3 mL/min (by C-G formula based on SCr of 0.64 mg/dL). Liver Function Tests: Recent Labs  Lab 09/09/19 0715  AST 23  ALT 56*  ALKPHOS 196*  BILITOT 1.2  PROT 5.9*  ALBUMIN 3.6   No results for input(s): LIPASE, AMYLASE in the last 168 hours. No results for input(s): AMMONIA in the last 168 hours. Coagulation Profile: No results for input(s): INR, PROTIME in the last 168 hours. Cardiac Enzymes: No results for  input(s): CKTOTAL, CKMB, CKMBINDEX, TROPONINI in the last 168 hours. BNP (last 3 results) No results for input(s): PROBNP in the last 8760 hours. HbA1C: No results for input(s): HGBA1C in the last 72 hours. CBG: Recent Labs  Lab 09/10/19 0610 09/10/19 1107 09/10/19 2130 09/11/19 0611 09/11/19 1109  GLUCAP 136*  136* 260* 163* 167* 261*   Lipid Profile: No results for input(s): CHOL, HDL, LDLCALC, TRIG, CHOLHDL, LDLDIRECT in the last 72 hours. Thyroid Function Tests: No results for input(s): TSH, T4TOTAL, FREET4, T3FREE, THYROIDAB in the last 72 hours. Anemia Panel: No results for input(s): VITAMINB12, FOLATE, FERRITIN, TIBC, IRON, RETICCTPCT in the last 72 hours. Urine analysis:    Component Value Date/Time   COLORURINE AMBER (A) 09/04/2019 0744   APPEARANCEUR CLOUDY (A) 09/04/2019 0744   LABSPEC 1.012 09/04/2019 0744   PHURINE 6.0 09/04/2019 0744   GLUCOSEU >=500 (A) 09/04/2019 0744   HGBUR SMALL (A) 09/04/2019 0744   BILIRUBINUR NEGATIVE 09/04/2019 0744   KETONESUR NEGATIVE 09/04/2019 0744   PROTEINUR 30 (A) 09/04/2019 0744   UROBILINOGEN 0.2 12/24/2008 1604   NITRITE POSITIVE (A) 09/04/2019 0744   LEUKOCYTESUR LARGE (A) 09/04/2019 0744   Sepsis Labs: _0 (procalcitonin:4,lacticidven:4)  ) Recent Results (from the past 240 hour(s))  SARS Coronavirus 2 by RT PCR (hospital order, performed in [place] hospital lab) Nasopharyngeal Nasopharyngeal Swab     Status: None   Collection Time: 08/08/2019 11:01 PM   Specimen: Nasopharyngeal Swab  Result Value Ref Range Status   SARS Coronavirus 2 NEGATIVE NEGATIVE Final    Comment: (NOTE) SARS-CoV-2 target nucleic acids are NOT DETECTED.  The SARS-CoV-2 RNA is  generally detectable in upper and lower respiratory specimens during the acute phase of infection. The lowest concentration of SARS-CoV-2 viral copies this assay can detect is 250 copies / mL. A negative result does not preclude SARS-CoV-2 infection and  should not be used as the sole basis for treatment or other patient management decisions.  A negative result may occur with improper specimen collection / handling, submission of specimen other than nasopharyngeal swab, presence of viral mutation(s) within the areas targeted by this assay, and inadequate number of viral copies (<250 copies / mL). A negative result must be combined with clinical observations, patient history, and epidemiological information.  Fact Sheet for Patients:   StrictlyIdeas.no  Fact Sheet for Healthcare Providers: BankingDealers.co.za  This test is not yet approved or  cleared by the [place] FDA and has been authorized for detection and/or diagnosis of SARS-CoV-2 by FDA under an Emergency Use Authorization (EUA).  This EUA will remain in effect (meaning this test can be used) for the duration of the COVID-19 declaration under Section 564(b)(1) of the Act, 21 U.S.C. section 360bbb-3(b)(1), unless the authorization is terminated or revoked sooner.  Performed at [facility], [place] [address], [place], [place] [address]   Culture, blood (routine x 2)     Status: None   Collection Time: 09/04/19 12:47 AM   Specimen: BLOOD RIGHT HAND  Result Value Ref Range Status   Specimen Description BLOOD RIGHT HAND  Final   Special Requests   Final    BOTTLES DRAWN AEROBIC AND ANAEROBIC Blood Culture adequate volume   Culture   Final    NO GROWTH 5 DAYS Performed at [facility], [place] [address] [place], [place] [address]    Report Status 09/09/2019 FINAL  Final  Culture, blood (routine x 2)     Status: None   Collection Time: 09/04/19  3:53 AM   Specimen: BLOOD  Result Value Ref Range Status   Specimen Description BLOOD LEFT ARM  Final   Special Requests   Final    BOTTLES DRAWN AEROBIC AND ANAEROBIC Blood Culture adequate volume   Culture   Final    NO GROWTH 5 DAYS Performed at [place] [address] [place], [place] [address]    Report Status 09/09/2019 FINAL  Final  Urine culture     Status: Abnormal   Collection Time: 09/04/19  7:44 AM   Specimen: Urine, Random  Result Value Ref Range Status   Specimen Description URINE, RANDOM  Final   Special Requests   Final    NONE Performed at [facility], [place] [address], [place], [place] [address]    Culture MULTIPLE SPECIES PRESENT, SUGGEST RECOLLECTION (A)  Final   Report Status 09/05/2019 FINAL  Final  MRSA PCR Screening     Status: None   Collection Time: 09/04/19  9:15 AM   Specimen: Nasal Mucosa; Nasopharyngeal  Result Value Ref Range Status   MRSA by PCR NEGATIVE NEGATIVE Final    Comment:        The GeneXpert MRSA Assay (FDA approved for NASAL specimens only), is one component of a comprehensive MRSA colonization surveillance program. It is not intended to diagnose MRSA infection nor to guide or monitor treatment for MRSA infections. Performed at [facility], [place] [address], [place], [place] [address]       Studies: DG Chest Port 1 View  Result Date: 09/11/2019 CLINICAL DATA:  Hypoxia EXAM: PORTABLE CHEST  1 VIEW COMPARISON:  September 08, 2019 FINDINGS: The cardiomediastinal silhouette is unchanged and enlarged in contour.RIGHT upper extremity PICC with tip terminating over the superior cavoatrial junction. No pleural effusion. No pneumothorax. Unchanged coarse reticular opacities consistent with underlying pulmonary fibrosis. No new focal consolidation identified. Visualized abdomen is unremarkable. Multilevel degenerative changes of the thoracic spine. IMPRESSION: Unchanged findings of pulmonary fibrosis. No new focal consolidation identified. Electronically Signed   By: Valentino Saxon MD   On: 09/11/2019 10:17   DG Abd Portable 1V  Result Date: 09/11/2019 CLINICAL DATA:  Abdominal pain EXAM: PORTABLE ABDOMEN - 1 VIEW COMPARISON:  September 05, 2019 FINDINGS: Paucity of bowel gas limits evaluation  for obstruction. Incomplete assessment of the RIGHT lateral soft tissues. Air is visualized in the rectum. Moderate colonic stool burden. No dilated loops of bowel are seen. Coarse interstitial opacities at the lung bases consistent with known pulmonary fibrosis. Degenerative changes of the lower lumbar spine. IMPRESSION: 1. Paucity of bowel gas limits evaluation for obstruction. Within the limitations, no evidence of bowel obstruction. 2. Moderate colonic stool burden. Electronically Signed   By: Valentino Saxon MD   On: 09/11/2019 10:19    Scheduled Meds: . apixaban  5 mg Oral BID  . atenolol  12.5 mg Oral BID  . Chlorhexidine Gluconate Cloth  6 each Topical Daily  . digoxin  0.125 mg Oral Daily  . doxycycline  100 mg Oral Q12H  . escitalopram  10 mg Oral Daily  . insulin aspart  0-15 Units Subcutaneous TID WC  . insulin aspart  0-5 Units Subcutaneous QHS  . insulin aspart  3 Units Subcutaneous TID WC  . insulin glargine  35 Units Subcutaneous QHS  . ipratropium-albuterol  3 mL Nebulization TID  . methylPREDNISolone (SOLU-MEDROL) injection  60 mg Intravenous Q12H  . midodrine  2.5 mg Oral TID WC  . mycophenolate  1,500 mg Oral BID  . pantoprazole  40 mg Oral Daily  . polyethylene glycol  17 g Oral BID  . Ensure Max Protein  11 oz Oral Daily  . senna-docusate  2 tablet Oral BID  . sildenafil  40 mg Oral TID  . sodium chloride flush  10-40 mL Intracatheter Q12H  . sodium chloride flush  3 mL Intravenous Q12H    Continuous Infusions: . sodium chloride 10 mL/hr at 09/05/19 0253     LOS: 8 days     Kayleen Memos, MD [facility] Pager [phone]  If 7PM-7AM, please contact night-coverage [email or web address] Password [facility] 09/11/2019, 12:43 PM

## 2019-09-11 NOTE — Progress Notes (Signed)
Name: Felicia [name] MRN: [id number] DOB: 16-Feb-1970    ADMISSION DATE:  08/12/2019 CONSULTATION DATE:  09/11/2019   REFERRING MD :  Delphia Grates, triad  CHIEF COMPLAINT: Respiratory distress, worsening hypoxia  BRIEF PATIENT DESCRIPTION: 49 year old with fibrotic NSIP and severe pulmonary hypertension and chronic hypoxic respiratory failure maintained on 10 to 15 L oxygen at home, admitted with worsening hypoxia.  She follows up at [facility] with Dr. Randol Kern and Dr. [name]  SIGNIFICANT EVENTS  7/28 PCCM consult 7/30 palliative care consult. Patient remains very steadfast in hoping for ongoing pulmonary improvement  8/2 slight decrease in O2 support   STUDIES:  Echocardiogram 04/2019 normal LV function, RVSP 64 , enlarged RV, bubble study negative.  PFTs 02/2017 FVC 1.25 L 36% predicted, FEV1 1.18 L 40% predicted with DLCO of 17   HRCT 02/2019 chronic fibrotic ILD progressed from 2018 with mild focus of honeycombing  Cath 3/ 2021right atrial pressure was 7, PA pressure 60/24 with a mean of 39 and a wedge of 10 her cardiac index was low at 1.91 and output of 3.89. Her PVR is thus 7.  HISTORY OF PRESENT ILLNESS: She has a history of ILD/biopsy-proven fibrotic NSIP dating back to 2010.  Serology has been negative in 2011 except for mildly elevated aldolase and she has been maintained on immunosuppressants, CellCept and prednisone, also Rituxan 6 monthly.  Follows with Dr. Randol Kern at [facility].  She has severe pulmonary hypertension, follows with Dr. [name] at [name] and is maintained on inhaled treprostinil and sildenafil.  Right heart cath from earlier this year as listed above. She reports worsening dyspnea over the past few days, a dry cough and worsening hypoxia for which she sought attention in the ED.  Reports falls, normally ambulates with a walker.  On further questioning she states that worsening may date back a few weeks Initial evaluation showed mild leukocytosis 13.8, lactate of 5.4, BNP 1982,  she required high flow nasal cannula with 20 L oxygen and 100%.  She was given Lasix , diuresed 1 L but then developed hypotension overnight and was given a 500 cc fluid bolus.  Empirically started on antibiotics  Prior [facility] records from Dr. [name] were reviewed , and prior office visit with my partner Dr. Gala Murdoch .  She has been advised weight loss prior to being listed for lung transplant. Prior hospitalization from 05/2019 and discharge summary was reviewed, treated with diuresis and systemic steroids with some improvement SUBJECTIVE:  No acute distress at rest.  Continues to place nonrebreather on her convenience depending on how she feels either short of breath or not   VITAL SIGNS: Temp:  [97.5 F (36.4 C)-97.9 F (36.6 C)] 97.5 F (36.4 C) (08/04 0820) Pulse Rate:  [47-100] 92 (08/04 0820) Resp:  [18-24] 21 (08/04 0820) BP: (92-121)/(69-90) 108/87 (08/04 0820) SpO2:  [92 %-100 %] 95 % (08/04 0820) FiO2 (%):  [80 %] 80 % (08/04 0820) Weight:  [86.2 kg] 86.2 kg (08/04 0448)  PHYSICAL EXAMINATION: General: Obese female no acute distress.  Eating despite having 8% oxygen and nonrebreather in place HEENT: MM pink/moist, no JVD or lymphadenopathy is appreciated Neuro: Somewhat stunned in appearance.  Able to communicate appropriately CV: Heart sounds distant but regular PULM: Poor air movement, currently on 8% high flow nasal cannula in 6% nonrebreather with sats of 96% GI: soft, bsx4 active  GU: Voids Extremities: warm/dry, negative edema  Skin: no rashes or lesions      Recent Labs  Lab 09/09/19 0715 09/10/19 0330  09/11/19 0526  NA 138 134* 136  K 3.2* 4.0 3.9  CL 88* 89* 89*  CO2 39* 36* 36*  BUN 25* 30* 30*  CREATININE 0.69 0.60 0.64  GLUCOSE 203* 148* 167*   Recent Labs  Lab 09/09/19 0715 09/10/19 1522 09/11/19 0631  HGB 12.7 12.3 12.2  HCT 43.6 42.3 42.2  WBC 14.2* 18.1* 18.5*  PLT 178 192 204   [place] EKG SITE RITE  Result Date: 09/09/2019 If Site Rite  image not attached, placement could not be confirmed due to current cardiac rhythm.   ASSESSMENT / PLAN:  Acute on chronic hypoxic respiratory failure -baseline on 10 to 15 L of oxygen, has 2 concentrators at home. Fibrotic NSIP -anti-Jo positive, on immunosuppressants.  Question acute flare.  Being treated with corticosteroids.  I suspect that this presentation is more decompensation of her pulmonary hypertension with profound RV dysfunction as opposed to a flare of her ILD. Severe pulmonary hypertension and cor pulmonale -was on Tyvaso and sildenafil, has stopped the Tyvaso due to cough.  Question whether this is contributed in part to her decompensation, volume overload -transfer to [facility] has been discussed, this could be accomplished if transport can provide adequate oxygen delivery, either 1.00 NRB or high flow nasal cannula.  Question whether they may start IV prostanoids P IV Lasix discontinued Jun 11, 2019 with CVP noted to be 6 cooperative 60% Continue Revatio, dose increased 8/4 Continue steroids Indwelling PICC does not demonstrate need for Lovenox Consider transfer to [facility] although she is on 80% FiO2 coupled with a 60% nonrebreather mask.   Richardson Landry Minor ACNP Acute Care Nurse Practitioner [place] Please consult Amion 09/11/2019, 9:03 AM   Attending Note:  I have examined patient, reviewed labs, studies and notes.   Subjective-interval: She remains comfortable but is on the same FiO2: 30 L/min high flow nasal cannula +1.00 NRB Sildenafil increased today CVP measured at 5-6 on 8/3, up to 10-11 on 8/4  Vitals:   09/11/19 0820 09/11/19 1114 09/11/19 1115 09/11/19 1138  BP: 108/87 101/81 101/81   Pulse: 92 88 88 90  Resp: (!) 21 20 (!) 22 (!) 24  Temp: (!) 97.5 F (36.4 C) (!) 96.4 F (35.8 C) 97.6 F (36.4 C)   TempSrc: Oral Axillary Oral   SpO2: 95% 96% 96% 97%  Weight:      Height:      Ill-appearing overweight woman  laying in bed on high flow nasal cannula.  Lungs coarse bilaterally, distant.  Heart regular distant, without a murmur.  Abdomen obese nondistended with positive bowel sounds.  Acute on chronic hypoxemic respiratory failure in the setting severe PAH due to fibrotic NSIP.  Question also component of ILD flare.  She is on chronic mycophenolate, currently on steroids for possible flare.  Has been diuresed aggressively, CVP 10-12.  Sildenafil increased to 40 mg 3 times daily on 8/4.  Up for discussion as a transfer to [facility] where she receives her pulmonary and cardiology care.  I would be okay with the transfer as long as it is possible to adequately oxygenate her in transit.  They would have to be able to provide either high flow nasal cannula or establish that she can oxygenate adequately on 1.00 NRB.   Baltazar Apo, MD, PhD 09/11/2019, 11:52 AM [facility] [phone] or if no answer [phone]

## 2019-09-11 NOTE — Progress Notes (Signed)
Dr. Mellody Memos (Medicine, cardiology) and Dr. Laurita Quint (PCCM) at [facility] have declined the transfer requested by the patient for continuity of care, stating they have no capacity at this time and won't for a long time.

## 2019-09-11 NOTE — Progress Notes (Signed)
Pt complaining of sacral area hurting after suppository given. Pt requesting Morphine for pain. Education given to patient on narcotic use with constipation. Pt still states she would like Morphine. Will recheck pain and continue to monitor.  Arletta Bale, RN

## 2019-09-11 NOTE — Progress Notes (Signed)
Initial Nutrition Assessment  DOCUMENTATION CODES:   Not applicable  INTERVENTION:    Ensure MAX Protein po BID, each supplement provides 150 kcal and 30 grams of protein  MVI daily   NUTRITION DIAGNOSIS:   Increased nutrient needs related to wound healing as evidenced by estimated needs.  GOAL:   Patient will meet greater than or equal to 90% of their needs  MONITOR:   PO intake, Supplement acceptance, Weight trends, Labs, I & O's  REASON FOR ASSESSMENT:   LOS    ASSESSMENT:   Patient with PMH significant for nonspecific interstitial pneumonitis, DM, pulmonary HTN, CHF, and chronic 10-15 L/min supplemental O2 requirement. Presents this admission with acute on chronic respiratory failure.   Pt denies loss in appetite PTA and states appetite has increased with steroid use. Pt has decided to change daily meal composition in attempt to lose weight as this is a requirement to be added to lung transplant list. Pt typically eats three meals daily that consists of protein, vegetable, and grain. Takes 1-2 premier protein daily. Appetite has been okay this admission. Meal completions charted as 50-100% since admit. She enjoys Ensure MAX.   Pt reports a UBW of 225 lb and endorses an intentional wt loss of 20 lbs. Records indicate pt weighed 210 lb on 4/27 and 190 lb this admission. Unable to determine actual dry wt loss given CHF hx.   Medications: 40 mg lasix BID, SS novolog, lantus, solumedrol, miralax, senokot  Labs: CBG 163-261  Diet Order:   Diet Order            Diet heart healthy/carb modified Room service appropriate? Yes; Fluid consistency: Thin; Fluid restriction: 1500 mL Fluid  Diet effective now                 EDUCATION NEEDS:   Not appropriate for education at this time  Skin:  Skin Assessment: Skin Integrity Issues: Skin Integrity Issues:: Stage II Stage II: buttocks  Last BM:  8/3  Height:   Ht Readings from Last 1 Encounters:  09/10/19 _0   (1.651 m)    Weight:   Wt Readings from Last 1 Encounters:  09/11/19 86.2 kg    BMI:  Body mass index is 31.62 kg/m.  Estimated Nutritional Needs:   Kcal:  2100-2300 kcal  Protein:  105-120 grams  Fluid:  >/= 2.1 L/day   Mariana Single RD, LDN Clinical Nutrition Pager listed in [place]

## 2019-09-11 NOTE — Progress Notes (Addendum)
Advanced Heart Failure Rounding Note  PCP-Cardiologist: Minus Breeding, MD   Subjective:   Remains on HFNC 30 with NRB as needed.   On 09/10/19 sildenafil 40 mg tid added. CVP was down to 5-6 so lasix was held. Today CVP up to 10-11.   Hopeful she can go to [name] today. Having some abd discomfort.    Objective:   Weight Range: 86.2 kg Body mass index is 31.62 kg/m.   Vital Signs:   Temp:  [97.5 F (36.4 C)-97.9 F (36.6 C)] 97.5 F (36.4 C) (08/04 0820) Pulse Rate:  [47-100] 92 (08/04 0820) Resp:  [18-24] 21 (08/04 0820) BP: (92-121)/(69-90) 108/87 (08/04 0820) SpO2:  [92 %-100 %] 95 % (08/04 0820) FiO2 (%):  [80 %] 80 % (08/04 0820) Weight:  [86.2 kg] 86.2 kg (08/04 0448) Last BM Date: 09/10/19  Weight change: Filed Weights   09/09/19 0500 09/10/19 0403 09/11/19 0448  Weight: 89 kg 93 kg 86.2 kg    Intake/Output:   Intake/Output Summary (Last 24 hours) at 09/11/2019 0935 Last data filed at 09/11/2019 0449 Gross per 24 hour  Intake 240 ml  Output 1350 ml  Net -1110 ml      Physical Exam   CVP 10-11 personally checked.  General: Appears chronically ill. No resp difficulty HEENT: Normal Neck: Supple. JVP 10-11  . Carotids 2+ bilat; no bruits. No lymphadenopathy or thyromegaly appreciated. Cor: PMI nondisplaced. Regular rate & rhythm. No rubs, gallops or murmurs. Lungs: Decreased in the bases.  Abdomen: Soft, nontender, nondistended. No hepatosplenomegaly. No bruits or masses. Good bowel sounds. Extremities: No cyanosis, clubbing, rash, edema RUE PICC  Neuro: Alert & orientedx3, cranial nerves grossly intact. moves all 4 extremities w/o difficulty. Affect pleasant   Telemetry   SR 80-90s   EKG    n/a  Labs    CBC Recent Labs    09/10/19 1522 09/11/19 0631  WBC 18.1* 18.5*  NEUTROABS 16.3* 16.6*  HGB 12.3 12.2  HCT 42.3 42.2  MCV 83.6 84.2  PLT 192 799   Basic Metabolic Panel Recent Labs    09/09/19 0715 09/09/19 0715 09/10/19 0330  09/11/19 0526  NA 138   < > 134* 136  K 3.2*   < > 4.0 3.9  CL 88*   < > 89* 89*  CO2 39*   < > 36* 36*  GLUCOSE 203*   < > 148* 167*  BUN 25*   < > 30* 30*  CREATININE 0.69   < > 0.60 0.64  CALCIUM 8.6*   < > 8.7* 9.2  MG 2.0   < > 2.0 1.9  PHOS 2.9  --   --   --    < > = values in this interval not displayed.   Liver Function Tests Recent Labs    09/09/19 0715  AST 23  ALT 56*  ALKPHOS 196*  BILITOT 1.2  PROT 5.9*  ALBUMIN 3.6   No results for input(s): LIPASE, AMYLASE in the last 72 hours. Cardiac Enzymes No results for input(s): CKTOTAL, CKMB, CKMBINDEX, TROPONINI in the last 72 hours.  BNP: BNP (last 3 results) Recent Labs    05/31/19 0025 09/07/2019 2141 09/09/19 0715  BNP 1,085.4* 1,982.4* 544.6*    ProBNP (last 3 results) No results for input(s): PROBNP in the last 8760 hours.   D-Dimer No results for input(s): DDIMER in the last 72 hours. Hemoglobin A1C No results for input(s): HGBA1C in the last 72 hours. Fasting Lipid Panel No  results for input(s): CHOL, HDL, LDLCALC, TRIG, CHOLHDL, LDLDIRECT in the last 72 hours. Thyroid Function Tests No results for input(s): TSH, T4TOTAL, T3FREE, THYROIDAB in the last 72 hours.  Invalid input(s): FREET3  Other results:   Imaging     No results found.   Medications:     Scheduled Medications: . apixaban  5 mg Oral BID  . atenolol  12.5 mg Oral BID  . bisacodyl  10 mg Rectal Once  . Chlorhexidine Gluconate Cloth  6 each Topical Daily  . digoxin  0.125 mg Oral Daily  . doxycycline  100 mg Oral Q12H  . escitalopram  10 mg Oral Daily  . furosemide  40 mg Intravenous Once  . insulin aspart  0-15 Units Subcutaneous TID WC  . insulin aspart  0-5 Units Subcutaneous QHS  . insulin aspart  3 Units Subcutaneous TID WC  . insulin glargine  35 Units Subcutaneous QHS  . ipratropium-albuterol  3 mL Nebulization TID  . methylPREDNISolone (SOLU-MEDROL) injection  60 mg Intravenous Q12H  . midodrine  2.5 mg  Oral TID WC  . mycophenolate  1,500 mg Oral BID  . pantoprazole  40 mg Oral Daily  . polyethylene glycol  17 g Oral Daily  . Ensure Max Protein  11 oz Oral Daily  . senna-docusate  2 tablet Oral BID  . sildenafil  40 mg Oral TID  . sodium chloride flush  10-40 mL Intracatheter Q12H  . sodium chloride flush  3 mL Intravenous Q12H     Infusions: . sodium chloride 10 mL/hr at 09/05/19 0253     PRN Medications:  sodium chloride, acetaminophen, albuterol, guaiFENesin-dextromethorphan, morphine injection, simethicone, sodium chloride flush, sodium chloride flush     Assessment/Plan  1. A/C Hypoxic Respiratory Failure - multifactorial with NSIP, PAH, Corpulonale On steroids + cellcept + nebs + HFNC 30 liters   2. A/C Cor Pulmonale ECHO EF 55% Severely reduced RV which has progressed from previous ECHO in March 2021 Also no shunt noted on bubble study.  - Volume status trending up. CVP 10 today. Give 40 mg IV lasix x1.  - Renal function stable.   3. [place] Followed [facility] by Dr [name]. She has been followed at [facility], thought to have mixed group 1 and group 3 (ILD/NSIP) PH.  -Most recent [place] 04/2019 with PVR 7 and CI 1.9. Not sure how much RHC will help.  - In the past she was on Tyvaso but stopped due to increased shortness of breath/cough. -On 8/3 sildenafil increased to 40 mg tid.  - Continue midodrine 2.5 mg three times a day.    4. SVT  Rate controlled.  Continue atenolol   5. Hypokalemia  K stable.   6. H/O DVT On eliquis 5 mg twice a day.   6. . Goals of Care Palliative consulted. Current Full Code.   Dr [place] Crane reaching out to [name] for transfer.   Length of Stay: [place], NP  09/11/2019, 9:35 AM  Advanced Heart Failure Team Pager [phone] (M-F; [place])  Please contact [facility] for night-coverage after hours (4p -7a ) and weekends on [email or web address]  Patient seen with NP, agree with the above note.   Still with dyspnea, remains on HFNC 30L.  She is  on IV Solumedrol.  BP stable on midodrine.  CVP 10-11 today on my measurement.  Co-ox 57%.  She is in NSR with frequent PACs. Complains of constipation.   General: NAD Neck: JVP 12 cm with  prominent CV waves, no thyromegaly or thyroid nodule.  Lungs: Decreased at bases.  CV: Nondisplaced PMI.  Heart regular S1/S2, no S3/S4, no murmur.  No peripheral edema.   Abdomen: Soft, nontender, no hepatosplenomegaly, no distention.  Skin: Intact without lesions or rashes.  Neurologic: Alert and oriented x 3.  Psych: Normal affect. Extremities: No clubbing or cyanosis.  HEENT: Normal.   1. Acute on chronic hypoxemic respiratory failure: Multifactorial. Has ILD/NSIP with possible flare as well as pulmonary hypertension/RV failure. At baseline, on 10-15 L HFNC. She is now on 30 L HFNC, IV Solumedrol.  - Continue treatment of ILD flare per pulmonary, on Solumedrol, mycophenolate.  - CVP 11-12, will give Lasix 40 mg IV bid today and follow I/Os.  - She will be transferred to [facility] today where she typically has her care.  2. Pulmonary hypertension: She has been followed at [name], thought to have mixed group 1 and group 3 (ILD/NSIP) PH. She has been on sildenafil and was on Tyvaso in the past. INCREASE study would suggest benefit for Tyvaso in patients like her. She does not like Tyvaso because it seems to trigger coughing => she has stopped it again and refuses to restart. Echo shows D-shaped interventricular septum with severely dilated and severely dysfunctional RV with estimated PASP 73 mmHg, moderate-severe TR, LV EF 50-55%, dilated IVC.   Bubble study negative in the past.  On exam today, she appears volume overloaded. CVP 10-11. Co-ox adequate for now at 57%.  - Continue sildenafil, have increased to 40 mg tid.  - She will not restart Tyvaso. Question efficacy of other selective pulmonary vasodilators given severe ILD and risk for V/Q mismatch.   - Co-ox adequate now, will not start milrinone for RV  support for the time being especially given transfer to [facility].   - Added digoxin 0.125 mg daily for RV.  - Added midodrine 2.5 mg tid for soft BP.  3. RV failure:She is volume overloaded on exam today, CVP elevated 10-11.  -Lasix 40 mg IV bid for now.   4. H/o SVT: On atenolol, quiescent.  5. H/o DVT in 2020: On Eliquis 5 mg bid. 6. ILD: NSIP. She is on steroids + Cellcept at home, followed at [facility] as outpatient.   Loralie Champagne 09/11/2019 1:42 PM

## 2019-09-11 NOTE — Discharge Summary (Signed)
Discharge Summary  Shavaun Osterloh QQV:[id number] DOB: 07-31-1970  PCP: Aretta Nip, MD  Admit date: 09/06/2019 Discharge date: 09/11/2019  Time spent: 35 minutes  Recommendations for Outpatient Follow-up:  1. Transfer to [name] at patient's own request where she receives her pulmonary and cardiology care.  Discharge Diagnoses:  Active Hospital Problems   Diagnosis Date Noted  . Acute on chronic respiratory failure with hypoxia ([place]) 05/30/2019  . Hypoxia   . Palliative care by specialist   . Pressure injury of skin 09/04/2019  . Uncontrolled diabetes mellitus with hyperglycemia ([place])   . Elevated troponin   . History of DVT (deep vein thrombosis) 05/31/2019  . Hypokalemia 05/31/2019  . Prolonged Q-T interval on ECG 05/31/2019  . Interstitial lung disease ([place]) 03/27/2018  . Acute on chronic diastolic CHF (congestive heart failure) ([place]) 03/21/2017  . DNR (do not resuscitate) discussion 03/21/2017    Resolved Hospital Problems  No resolved problems to display.    Discharge Condition: Stable on HFNC 30L/min at 80% FiO2, may require NRB additionally.   Diet recommendation: Heart healthy carb modified diet  Vitals:   09/11/19 1115 09/11/19 1138  BP: 101/81   Pulse: 88 90  Resp: (!) 22 (!) 24  Temp: 97.6 F (36.4 C)   SpO2: 96% 97%    History of present illness:  Patient was admitted to the hospital with working diagnosis of acute on chronic hypoxic respiratory failure, due to exacerbation of her autoimmune interstitial lung disease, NSIP flare, worsening right heart failure, and severe pulmonary hypertension with core pulmonale.  49 year old female with past medical history significant for nonspecific interstitial pneumonitis, autoimmune interstitial lung disease, chronic hypoxia on 2 concentrators 15L at baseline, severe pulmonary hypertension, right heart failure, type 2 diabetes mellitus, DVT on Eliquis who presented to [facility] with worsening  dyspnea, associated with cough and orthopnea.On her initial physical examination she was in respiratory distress, respiratory rate 40 breaths/min, oxygen saturation 93% on 25 L high flow nasal cannula.Her lungs had diffuse rales bilaterally. Troponin I 206-193. BNP 1982, white count 13.8K, hemoglobin 13.8K, platelets 249K.SARS COVID-19 negative. Urinalysis 21-50 red cells, more than 50 white cells, specific gravity 1.012. Chest radiograph with bilateral interstitial infiltrates.EKG 89 bpm, rightward axis, prolonged QTC 500, sinus rhythm, no specific ST T changes.  Was started on aggressive diuresis and high dose IV steroids with improvement of her symptomatology. She initially required non rebreather 15 L/ min plus heated high flow nasal cannula 30 L/ min at 100% FiO2, she is being weaned as tolerated.   2D Echocardiogram done on 09/05/19 showed worsening RV systolic failure with severely reduced RV which has progressed from previous echo in March 2021.  Cardiology/Advanced Heart Failure team was consulted and assisting with the management.   CT chest done on 09/05/19 w/o contrast showed stable ILD pattern.Pulmonary was consulted and assisting with the management.  Likely acute on chronic hypoxic respiratory failure is multifactorial 2/2 to profound RV dysfunction, acute on chronic cor pulmonale, severe pulmonary hypertension, and flare of NSIP.  Appreciate specialists assistance.  Patient requests transfer to [facility].  Her pulmonologist and cardiologist are from [facility].  09/11/19: Seen and examined.  She is on HFNC 30L/min at 80% FiO2 and NRB at her mouth with O2 saturation on the monitor in the room at 94%.  States breathing is improved.  Elevated CVP this AM 10-11, diuretic IV lasix 80 mg BID was held yesterday when CVP was 5-6. Reports abdominal discomfort and intermittent nausea with no vomiting.  Port abdominal xray and CXR obtained this morning: No evidence of bowel obstruction, moderate  colonic stool burden. Unchanged findings of pulmonary fibrosis. No new focal consolidation identified.   Hospital Course:  Principal Problem:   Acute on chronic respiratory failure with hypoxia (HCC) Active Problems:   Acute on chronic diastolic CHF (congestive heart failure) (HCC)   DNR (do not resuscitate) discussion   Interstitial lung disease ([place])   Hypokalemia   History of DVT (deep vein thrombosis)   Prolonged Q-T interval on ECG   Uncontrolled diabetes mellitus with hyperglycemia (HCC)   Elevated troponin   Pressure injury of skin   Palliative care by specialist   Hypoxia                          Acute on chronic hypoxic respiratory failure, likely                                              multifactorial 2/2 to profound RV dysfunction, acute on                                      chronic cor pulmonale, severe pulmonary hypertension, and                             flare of NSIP. 2019 DLCO 17,  HRCT 2021 with progressive fibrotic ILD,  Right heart cath 04/2019 PA pressure 60/24 with mean 39. Follow up echocardiogram with severe and worsening reduction in RV systolic function, with severe elevated pulmonary artery systolic pressure, estimated 73 mmHg.  CT chest fibrosis pattern unchanged. Dyspnea improving with high doses IV diuretics and high doses IV steroids Currently on high flow nasal cannula down to 30L/min with FiO2 80% coupled with a 60% NRB.  She uses NRB at her convenience whether she feels dyspneic or not.  Continue to wean off as tolerated.  Discussed with Dr. Delight Hoh at [name] (09/11/19) regarding transfer, they are willing to consider.  Per transfer center coordinator, Rise Paganini, transportation can provide HFNC at her current setting 30L/min at 80% FiO2.  They will check to see if they can also couple with NRB if needed for transport.  The drive is close to 2 hours from [name] depending on traffic.  Medical therapy with: IV Methylprednisolone60  bid, continued, po protonix 40 mg daily for GI ppx IV Furosemide 80 bid held on 8/3 due to CVP 5-6, midodrine added due to soft Bps CVP 10-11 on 09/11/19, defer to cardiology/ Advanced heart failure team to resume diuretics Sildenafil, continued, dose increased to 40 mg TID on 8/4 Mycophenolate, continued  Acute on chronic dCHF Elevated BNP, fluid overload, pulmonary edema Diuresing deferred to advanced heart failure team PICC line in place to monitor CVP, current CVP is elevated 10-11 IV lasix 80 BID on hold yesterday with CVP 5-6, midodrine for BP support Net I&O -6.9L On Atenolol Continue strict I&O and daily weight  Severe pulmonary hypertension/cor pulmonale Worsening right heart failure on 2D echo done on 09/05/2019 Received aggressive diuresing with IV Lasix 80 mg BID Continue sildenafil, dose increased, and low-dose atenolol 12.5 mg twice daily  Was previously on Tyvaso for PAH, stopped due to cough Cardiology/advanced heart failure team assisting with management Continue Digoxin added 8/3 Follows with cardiology at [facility]  Autoimmune interstitial lung disease/nonspecific interstitial pneumonitis Follows with pulmonary at [facility] risk for decompensation Currently on CellCept and IV Solu-Medrol Pulmonary/Critical care medicine following, appreciate recommendations  Resolved post repletion: Hypokalemia/hyponatremia/hypomagnesemia, diuretic induced.Renal function stable with serum cr at 0.64 and GFR >60 Serum potassium 3.9. Serum magnesium 1.9.  Monitor electrolytes and replete as indicated  UncontrolledT2DMwith Hgb A1c 8,8 with hyperglycemia, likely exacerbated by IV steroids Currently on IV Solu-Medrol 60 mg twice daily Increase Lantus to 35 units nightly Continue NovoLog 3 units before meals Continue insulin sliding scale  History of DVT Diagnosed May 14, 2019  Continue Eliquis  Prolonged QTc. Twelve-lead EKG done on 09/05/2019,  QTC 568 Repeat 12 lead EKG on 09/10/19 QTC 606 Avoid QTC prolonging agents Repeat twelve-lead EKG on 09/11/2019 Continue to closely monitor on progressive unit.    Left ear discomfort/recurrent sinusitis She is on Bactrim prophylactically Monday Wednesday Friday Bactrim on hold due to prolonged QTC Continue doxycycline 100 mg twice daily x5 days.  Obesity.  BMI 32  Weight loss outpatient once stable  Stage 2 pressure ulcer at the buttocks.Present on admission,Continue local wound care.  Chronic depression/anxiety Stable Continue Lexapro  Chronic constipation Continue bowel regimen senokot 2 tablets BID, Miralax 17 g BID Added supp dulcolax once if needed Moderate stool burden on abd xray If no improvement, consider smog enema  Goals of care PMT consulted to assist with establishing goals of care She is at high risk for decompensation due to her multiple comorbidities                           Per PMT, patient is open to all offered and available medical                                     interventions to prolong life.  Ultimately she is hopeful for                                           transplant. Patient is full code    Patient continue to be at high risk forworsening hypoxic respiratory failure  Status is: Inpatient  Remains inpatient appropriate because:IV treatments appropriate due to intensity of illness.   Dispo: The patient is from:Home Anticipated d/c is [id number] to [name] center where she receives her pulmonary and cardiology care. Anticipated d/c date is: 09/11/19 Patient currently is not medically stable to d/c.  Ongoing treatment for severe right heart failure and severe acute on chronic hypoxic respiratory failure requiring HFNC.  DVT prophylaxis:apixaban Code Status:fullcode Family Communication:None at bedside.   Nutrition Status:    Skin  Documentation: Pressure Injury 09/04/19 Buttocks Left Stage 2 - Partial thickness loss of dermis presenting as a shallow open injury with a red, pink wound bed without slough. small circular size of pencil (Active)  09/04/19 0200  Location: Buttocks  Location Orientation: Left  Staging: Stage 2 - Partial thickness loss of dermis presenting as a shallow open injury with a red, pink wound bed without slough.  Wound Description (Comments): small circular size of pencil  Present on Admission: Yes  Consultants:  Pulmonary  Cardiology  Advanced heart team.         Procedures:  PICC line placement on 09/10/19 for CVP monitoring  Consultations:  Cardiology  Advanced heart failure team  Pulmonary/critical care medicine  Palliative care team  Discharge Exam: BP 101/81 (BP Location: Left Arm)   Pulse 90   Temp 97.6 F (36.4 C) (Oral)   Resp (!) 24   Ht _0  (1.651 m)   Wt 86.2 kg   SpO2 97%   BMI 31.62 kg/m  . General: 49 y.o. year-old female pleasant well-developed well-nourished in no acute distress.  Alert and oriented x3.   . Cardiovascular: Regular rate and rhythm no rubs or gallops. [name] Respiratory: Clear to auscultation no wheezes no rales.   . Abdomen: Soft nontender hypoactive bowel sounds present.   . Musculoskeletal: No lower extremity edema bilaterally.. . Psychiatry: Mood is appropriate for condition and setting  Discharge Instructions You were cared for by a hospitalist during your hospital stay. If you have any questions about your discharge medications or the care you received while you were in the hospital after you are discharged, you can call the unit and asked to speak with the hospitalist on call if the hospitalist that took care of you is not available. Once you are discharged, your primary care physician will handle any further medical issues. Please note that NO REFILLS for any discharge medications will be authorized once you are  discharged, as it is imperative that you return to your primary care physician (or establish a relationship with a primary care physician if you do not have one) for your aftercare needs so that they can reassess your need for medications and monitor your lab values.   Allergies as of 09/11/2019      Reactions   Lexapro [escitalopram] Nausea Only      Medication List    STOP taking these medications   furosemide 20 MG tablet Commonly known as: LASIX   gabapentin 300 MG capsule Commonly known as: NEURONTIN   ibuprofen 200 MG tablet Commonly known as: ADVIL   potassium chloride SA 20 MEQ tablet Commonly known as: KLOR-CON   sulfamethoxazole-trimethoprim 800-160 MG tablet Commonly known as: BACTRIM DS   Tresiba FlexTouch 100 UNIT/ML FlexTouch Pen Generic drug: insulin degludec   verapamil 120 MG CR tablet Commonly known as: CALAN-SR     TAKE these medications   acetaminophen 500 MG tablet Commonly known as: TYLENOL Take 2,000 mg by mouth as needed for moderate pain.   albuterol 108 (90 Base) MCG/ACT inhaler Commonly known as: VENTOLIN HFA Inhale 1 puff into the lungs every 6 (six) hours as needed for wheezing or shortness of breath.   apixaban 5 MG Tabs tablet Commonly known as: Eliquis Take 1 tablet (5 mg total) by mouth 2 (two) times daily.   atenolol 25 MG tablet Commonly known as: TENORMIN Take 0.5 tablets (12.5 mg total) by mouth 2 (two) times daily. What changed:   medication strength  how much to take   calcium citrate 950 (200 Ca) MG tablet Commonly known as: CALCITRATE - dosed in mg elemental calcium Take 200 mg of elemental calcium by mouth daily.   cholecalciferol 25 MCG (1000 UNIT) tablet Commonly known as: VITAMIN D3 Take 1,000 Units by mouth daily.   digoxin 0.125 MG tablet Commonly known as: LANOXIN Take 1 tablet (0.125 mg total) by mouth daily. Start taking on: September 12, 2019   doxycycline 100 MG tablet Commonly known  as:  VIBRA-TABS Take 1 tablet (100 mg total) by mouth every 12 (twelve) hours for 4 days.   Ensure Max Protein Liqd Take 330 mLs (11 oz total) by mouth daily for 7 days. Start taking on: September 12, 2019   escitalopram 10 MG tablet Commonly known as: LEXAPRO Take 10 mg by mouth daily.   HumaLOG KwikPen 100 UNIT/ML KwikPen Generic drug: insulin lispro Inject 0-20 Units into the skin 2 (two) times daily. Sliding scale based on sugar level   ipratropium-albuterol 0.5-2.5 (3) MG/3ML Soln Commonly known as: DUONEB Inhale 3 mLs into the lungs every 6 (six) hours as needed (sob and wheezing).   Lantus SoloStar 100 UNIT/ML Solostar Pen Generic drug: insulin glargine Inject 30 Units into the skin at bedtime.   metFORMIN 500 MG 24 hr tablet Commonly known as: GLUCOPHAGE-XR Take 1,000 mg by mouth at bedtime.   midodrine 2.5 MG tablet Commonly known as: PROAMATINE Take 1 tablet (2.5 mg total) by mouth 3 (three) times daily with meals for 10 days.   Mucinex 600 MG 12 hr tablet Generic drug: guaiFENesin Take 600 mg by mouth daily.   mycophenolate 500 MG tablet Commonly known as: CELLCEPT Take 1,500 mg by mouth 2 (two) times daily.   pantoprazole 40 MG tablet Commonly known as: PROTONIX Take 1 tablet (40 mg total) by mouth daily for 10 days. Start taking on: September 12, 2019   polyethylene glycol 17 g packet Commonly known as: MIRALAX / GLYCOLAX Take 17 g by mouth 2 (two) times daily.   predniSONE 10 MG tablet Commonly known as: DELTASONE TAKE 4 TABLETS BY MOUTH DAILY WITH BREAKFAST What changed: See the new instructions.   sildenafil 20 MG tablet Commonly known as: REVATIO Take 2 tablets (40 mg total) by mouth 3 (three) times daily. What changed:   how much to take  when to take this      Allergies  Allergen Reactions  . Lexapro [Escitalopram] Nausea Only    Follow-up Information    [place], Bill Salinas, MD. Call in 1 day(s).   Specialty: Family Medicine Why: please  call for a post hospital follow up appointment Contact information: [address] [place] [address] [phone]        Minus Breeding, MD .   Specialty: Cardiology Contact information: [place] [address] [phone]        Rich Reining, MD. Call in 1 day(s).   Specialty: Internal Medicine Contact information: [facility] [place] [id number] [place] [address] [phone]        [name], MD. Call in 1 day(s).   Specialty: Internal Medicine Contact information: [facility] [address] [place] [address] [phone]                The results of significant diagnostics from this hospitalization (including imaging, microbiology, ancillary and laboratory) are listed below for reference.    Significant Diagnostic Studies: CT CHEST WO CONTRAST  Result Date: 09/06/2019 CLINICAL DATA:  Interstitial lung disease. EXAM: CT CHEST WITHOUT CONTRAST TECHNIQUE: Multidetector CT imaging of the chest was performed following the standard protocol without IV contrast. COMPARISON:  02/25/2019 02/04/2019, 03/27/2018, 02/03/2017. FINDINGS: Cardiovascular: Pulmonic trunk and heart are enlarged. No pericardial effusion. Mediastinum/Nodes: No pathologically enlarged mediastinal or axillary lymph nodes. Hilar regions are difficult to evaluate without IV contrast. Esophagus is grossly unremarkable. Lungs/Pleura: Patchy pulmonary parenchymal ground-glass, interstitial coarsening and traction bronchiectasis/bronchiolectasis, basilar predominant and unchanged from  02/03/2017. No pleural fluid. Airway is unremarkable. Upper Abdomen: Image quality in the upper abdomen is degraded by body habitus and streak artifact. Liver and gallbladder are unremarkable. Visualized portions of the adrenal glands, kidneys, spleen, pancreas, stomach and bowel are unremarkable. Musculoskeletal: Degenerative changes in the spine. Old  right rib fracture. No worrisome lytic or sclerotic lesions. IMPRESSION: 1. Pulmonary parenchymal pattern of fibrosis is unchanged and in keeping with biopsy-proven fibrotic nonspecific interstitial pneumonitis. 2. Enlarged pulmonic trunk, indicative pulmonary arterial hypertension. Electronically Signed   By: Lorin Picket M.D.   On: 09/06/2019 09:19   DG Chest Port 1 View  Result Date: 09/11/2019 CLINICAL DATA:  Hypoxia EXAM: PORTABLE CHEST 1 VIEW COMPARISON:  September 08, 2019 FINDINGS: The cardiomediastinal silhouette is unchanged and enlarged in contour.RIGHT upper extremity PICC with tip terminating over the superior cavoatrial junction. No pleural effusion. No pneumothorax. Unchanged coarse reticular opacities consistent with underlying pulmonary fibrosis. No new focal consolidation identified. Visualized abdomen is unremarkable. Multilevel degenerative changes of the thoracic spine. IMPRESSION: Unchanged findings of pulmonary fibrosis. No new focal consolidation identified. Electronically Signed   By: Valentino Saxon MD   On: 09/11/2019 10:17   DG CHEST PORT 1 VIEW  Result Date: 09/08/2019 CLINICAL DATA:  Respiratory distress with hypoxia. EXAM: PORTABLE CHEST 1 VIEW COMPARISON:  Radiograph 08/21/2019.  CT 09/05/2019 FINDINGS: Interstitial lung disease with diffuse interstitial opacities similar to prior exam. Chronic elevation of right hemidiaphragm. No superimposed focal airspace disease. Mild cardiomegaly is unchanged. No significant pleural effusion. No pneumothorax. Stable osseous structures. IMPRESSION: 1. Stable interstitial lung disease recent imaging. No superimposed focal airspace disease. 2. Stable mild cardiomegaly. Electronically Signed   By: Keith Rake M.D.   On: 09/08/2019 17:06   DG Chest Portable 1 View  Result Date: 09/01/2019 CLINICAL DATA:  Respiratory distress EXAM: PORTABLE CHEST 1 VIEW COMPARISON:  05/30/2019 FINDINGS: Cardiac shadow is stable. Diffuse interstitial  lung changes are again identified and stable. No focal confluent infiltrate is seen superimposed over the chronic interstitial change. No bony abnormality is seen. IMPRESSION: Chronic fibrotic interstitial disease stable from the prior exam. Electronically Signed   By: Inez Catalina M.D.   On: 09/02/2019 22:09   DG Abd Portable 1V  Result Date: 09/11/2019 CLINICAL DATA:  Abdominal pain EXAM: PORTABLE ABDOMEN - 1 VIEW COMPARISON:  September 05, 2019 FINDINGS: Paucity of bowel gas limits evaluation for obstruction. Incomplete assessment of the RIGHT lateral soft tissues. Air is visualized in the rectum. Moderate colonic stool burden. No dilated loops of bowel are seen. Coarse interstitial opacities at the lung bases consistent with known pulmonary fibrosis. Degenerative changes of the lower lumbar spine. IMPRESSION: 1. Paucity of bowel gas limits evaluation for obstruction. Within the limitations, no evidence of bowel obstruction. 2. Moderate colonic stool burden. Electronically Signed   By: Valentino Saxon MD   On: 09/11/2019 10:19   ECHOCARDIOGRAM LIMITED  Result Date: 09/05/2019    ECHOCARDIOGRAM LIMITED REPORT   Patient Name:   MIKAEL DEBELL Date of Exam: 09/05/2019 Medical Rec #:  [id number]          Height:       65.0 in Accession #:    [id number]         Weight:       193.6 lb Date of Birth:  December 07, 1970          BSA:          1.951 m Patient Age:    49 years  BP:           128/94 mmHg Patient Gender: F                  HR:           101 bpm. Exam Location:  Inpatient Procedure: Limited Echo, Cardiac Doppler and Color Doppler Indications:     Dyspnea 786.09 / R06.00  History:         Patient has prior history of Echocardiogram examinations, most                  recent 04/25/2019. Pulmonary HTN; Risk Factors:Hypertension,                  Diabetes and Non-Smoker. SVT.  Sonographer:     Vickie Epley RDCS Referring Phys:  [id number] [place] Diagnosing Phys: Oswaldo Milian MD  IMPRESSIONS  1. Left ventricular ejection fraction, by estimation, is 50 to 55%. The left ventricle has low normal function. There is the interventricular septum is flattened in systole and diastole, consistent with right ventricular pressure and volume overload.  2. Right ventricular systolic function is severely reduced. The right ventricular size is severely enlarged. There is severely elevated pulmonary artery systolic pressure. The estimated right ventricular systolic pressure is 40.0 mmHg.  3. Right atrial size was severely dilated.  4. Tricuspid valve regurgitation is moderate to severe.  5. The aortic valve is tricuspid. Aortic valve regurgitation is not visualized.  6. The mitral valve is normal in structure. No evidence of mitral valve regurgitation.  7. The inferior vena cava is dilated in size with <50% respiratory variability, suggesting right atrial pressure of 15 mmHg.  8. Compared to prior echo 04/25/19, worsening RV dilatation and systolic dysfunction FINDINGS  Left Ventricle: Left ventricular ejection fraction, by estimation, is 50 to 55%. The left ventricle has low normal function. The left ventricle demonstrates regional wall motion abnormalities. The interventricular septum is flattened in systole and diastole, consistent with right ventricular pressure and volume overload. Right Ventricle: The right ventricular size is severely enlarged. Right ventricular systolic function is severely reduced. There is severely elevated pulmonary artery systolic pressure. The tricuspid regurgitant velocity is 3.81 m/s, and with an assumed right atrial pressure of 15 mmHg, the estimated right ventricular systolic pressure is 86.7 mmHg. Right Atrium: Right atrial size was severely dilated. Pericardium: There is no evidence of pericardial effusion. Mitral Valve: The mitral valve is normal in structure. Tricuspid Valve: The tricuspid valve is normal in structure. Tricuspid valve regurgitation is moderate to severe.  Aortic Valve: The aortic valve is tricuspid. Aortic valve regurgitation is not visualized. Pulmonic Valve: The pulmonic valve was not well visualized. Pulmonic valve regurgitation is trivial. Venous: The inferior vena cava is dilated in size with less than 50% respiratory variability, suggesting right atrial pressure of 15 mmHg. RIGHT ATRIUM           Index RA Area:     24.00 cm RA Volume:   88.50 ml  45.37 ml/m  TRICUSPID VALVE TR Peak grad:   58.1 mmHg TR Vmax:        381.00 cm/s Oswaldo Milian MD Electronically signed by Oswaldo Milian MD Signature Date/Time: 09/05/2019/3:17:21 PM    Final (Updated)    [place] EKG SITE RITE  Result Date: 09/09/2019 If Site Rite image not attached, placement could not be confirmed due to current cardiac rhythm.   Microbiology: Recent Results (from the past 240 hour(s))  SARS Coronavirus 2 by  RT PCR (hospital order, performed in [facility] hospital lab) Nasopharyngeal Nasopharyngeal Swab     Status: None   Collection Time: 08/10/2019 11:01 PM   Specimen: Nasopharyngeal Swab  Result Value Ref Range Status   SARS Coronavirus 2 NEGATIVE NEGATIVE Final    Comment: (NOTE) SARS-CoV-2 target nucleic acids are NOT DETECTED.  The SARS-CoV-2 RNA is generally detectable in upper and lower respiratory specimens during the acute phase of infection. The lowest concentration of SARS-CoV-2 viral copies this assay can detect is 250 copies / mL. A negative result does not preclude SARS-CoV-2 infection and should not be used as the sole basis for treatment or other patient management decisions.  A negative result may occur with improper specimen collection / handling, submission of specimen other than nasopharyngeal swab, presence of viral mutation(s) within the areas targeted by this assay, and inadequate number of viral copies (<250 copies / mL). A negative result must be combined with clinical observations, patient history, and epidemiological information.  Fact  Sheet for Patients:   StrictlyIdeas.no  Fact Sheet for Healthcare Providers: BankingDealers.co.za  This test is not yet approved or  cleared by the [place] FDA and has been authorized for detection and/or diagnosis of SARS-CoV-2 by FDA under an Emergency Use Authorization (EUA).  This EUA will remain in effect (meaning this test can be used) for the duration of the COVID-19 declaration under Section 564(b)(1) of the Act, 21 U.S.C. section 360bbb-3(b)(1), unless the authorization is terminated or revoked sooner.  Performed at [facility], [place] [address] [place], [place] [address]   Culture, blood (routine x 2)     Status: None   Collection Time: 09/04/19 12:47 AM   Specimen: BLOOD RIGHT HAND  Result Value Ref Range Status   Specimen Description BLOOD RIGHT HAND  Final   Special Requests   Final    BOTTLES DRAWN AEROBIC AND ANAEROBIC Blood Culture adequate volume   Culture   Final    NO GROWTH 5 DAYS Performed at [facility], [place] [address] [place], [place] [address]    Report Status 09/09/2019 FINAL  Final  Culture, blood (routine x 2)     Status: None   Collection Time: 09/04/19  3:53 AM   Specimen: BLOOD  Result Value Ref Range Status   Specimen Description BLOOD LEFT ARM  Final   Special Requests   Final    BOTTLES DRAWN AEROBIC AND ANAEROBIC Blood Culture adequate volume   Culture   Final    NO GROWTH 5 DAYS Performed at [place] [address] [place], [place] [address]    Report Status 09/09/2019 FINAL  Final  Urine culture     Status: Abnormal   Collection Time: 09/04/19  7:44 AM   Specimen: Urine, Random  Result Value Ref Range Status   Specimen Description URINE, RANDOM  Final   Special Requests   Final    NONE Performed at [facility], [place] [address] [place], [place] [address]    Culture MULTIPLE SPECIES PRESENT, SUGGEST RECOLLECTION (A)  Final   Report Status 09/05/2019  FINAL  Final  MRSA PCR Screening     Status: None   Collection Time: 09/04/19  9:15 AM   Specimen: Nasal Mucosa; Nasopharyngeal  Result Value Ref Range Status   MRSA by PCR NEGATIVE NEGATIVE Final    Comment:        The GeneXpert MRSA Assay (FDA approved for NASAL specimens only), is  one component of a comprehensive MRSA colonization surveillance program. It is not intended to diagnose MRSA infection nor to guide or monitor treatment for MRSA infections. Performed at [facility], [place] [address], [place], [place] [address]      Labs: Basic Metabolic Panel: Recent Labs  Lab 09/06/19 [phone] 09/06/19 0437 09/07/19 0200 09/08/19 0247 09/09/19 0715 09/10/19 0330 09/11/19 0526  NA 138   < > 132* 135 138 134* 136  K 4.4   < > 4.4 3.8 3.2* 4.0 3.9  CL 96*   < > 94* 93* 88* 89* 89*  CO2 29   < > 24 30 39* 36* 36*  GLUCOSE 188*   < > 317* 308* 203* 148* 167*  BUN 36*   < > 36* 30* 25* 30* 30*  CREATININE 0.99   < > 0.85 0.81 0.69 0.60 0.64  CALCIUM 8.4*   < > 8.3* 8.3* 8.6* 8.7* 9.2  MG 2.2  --  2.2  --  2.0 2.0 1.9  PHOS  --   --   --   --  2.9  --   --    < > = values in this interval not displayed.   Liver Function Tests: Recent Labs  Lab 09/09/19 0715  AST 23  ALT 56*  ALKPHOS 196*  BILITOT 1.2  PROT 5.9*  ALBUMIN 3.6   No results for input(s): LIPASE, AMYLASE in the last 168 hours. No results for input(s): AMMONIA in the last 168 hours. CBC: Recent Labs  Lab 09/05/19 0044 09/09/19 0715 09/10/19 1522 09/11/19 0631  WBC 13.4* 14.2* 18.1* 18.5*  NEUTROABS  --  12.9* 16.3* 16.6*  HGB 12.6 12.7 12.3 12.2  HCT 42.6 43.6 42.3 42.2  MCV 83.0 83.4 83.6 84.2  PLT 239 178 192 204   Cardiac Enzymes: No results for input(s): CKTOTAL, CKMB, CKMBINDEX, TROPONINI in the last 168 hours. BNP: BNP (last 3 results) Recent Labs    05/31/19 0025 08/13/2019 2141 09/09/19 0715  BNP 1,085.4* 1,982.4* 544.6*    ProBNP (last 3 results) No results for input(s):  PROBNP in the last 8760 hours.  CBG: Recent Labs  Lab 09/10/19 0610 09/10/19 1107 09/10/19 2130 09/11/19 0611 09/11/19 1109  GLUCAP 136*  136* 260* 163* 167* 261*       Signed:  Kayleen Memos, MD [facility] 09/11/2019, 12:56 PM

## 2019-09-12 LAB — COOXEMETRY PANEL
Carboxyhemoglobin: 1.3 % (ref 0.5–1.5)
Methemoglobin: 1 % (ref 0.0–1.5)
O2 Saturation: 57.8 %
Total hemoglobin: 13.9 g/dL (ref 12.0–16.0)

## 2019-09-12 LAB — BASIC METABOLIC PANEL
Anion gap: 14 (ref 5–15)
BUN: 30 mg/dL — ABNORMAL HIGH (ref 6–20)
CO2: 40 mmol/L — ABNORMAL HIGH (ref 22–32)
Calcium: 9.4 mg/dL (ref 8.9–10.3)
Chloride: 85 mmol/L — ABNORMAL LOW (ref 98–111)
Creatinine, Ser: 0.72 mg/dL (ref 0.44–1.00)
GFR calc Af Amer: >60 mL/min (ref >60)
GFR calc non Af Amer: >60 mL/min (ref >60)
Glucose, Bld: 155 mg/dL — ABNORMAL HIGH (ref 70–99)
Potassium: 4.3 mmol/L (ref 3.5–5.1)
Sodium: 139 mmol/L (ref 135–145)

## 2019-09-12 LAB — CBC
HCT: 46.1 % — ABNORMAL HIGH (ref 36.0–46.0)
Hemoglobin: 13.4 g/dL (ref 12.0–15.0)
MCH: 24.4 pg — ABNORMAL LOW (ref 26.0–34.0)
MCHC: 29.1 g/dL — ABNORMAL LOW (ref 30.0–36.0)
MCV: 83.8 fL (ref 80.0–100.0)
Platelets: 220 10*3/uL (ref 150–400)
RBC: 5.5 MIL/uL — ABNORMAL HIGH (ref 3.87–5.11)
RDW: 16.7 % — ABNORMAL HIGH (ref 11.5–15.5)
WBC: 18.7 10*3/uL — ABNORMAL HIGH (ref 4.0–10.5)
nRBC: 0.2 % (ref 0.0–0.2)

## 2019-09-12 LAB — GLUCOSE, CAPILLARY
Glucose-Capillary: 203 mg/dL — ABNORMAL HIGH (ref 70–99)
Glucose-Capillary: 140 mg/dL — ABNORMAL HIGH (ref 70–99)
Glucose-Capillary: 94 mg/dL (ref 70–99)

## 2019-09-12 LAB — MAGNESIUM: Magnesium: 2 mg/dL (ref 1.7–2.4)

## 2019-09-12 LAB — PHOSPHORUS: Phosphorus: 5.1 mg/dL — ABNORMAL HIGH (ref 2.5–4.6)

## 2019-09-12 MED ORDER — FUROSEMIDE 10 MG/ML IJ SOLN
40.0000 mg | Freq: Once | INTRAMUSCULAR | Status: AC
  Administered 2019-09-12: 40 mg via INTRAVENOUS
  Filled 2019-09-12: qty 4

## 2019-09-12 MED ORDER — PREDNISONE 20 MG PO TABS
40.0000 mg | ORAL_TABLET | Freq: Every day | ORAL | Status: DC
  Administered 2019-09-13 – 2019-09-23 (×11): 40 mg via ORAL
  Filled 2019-09-12 (×2): qty 2
  Filled 2019-09-12 (×6): qty 4
  Filled 2019-09-12: qty 2
  Filled 2019-09-12 (×2): qty 4

## 2019-09-12 MED ORDER — TORSEMIDE 20 MG PO TABS
60.0000 mg | ORAL_TABLET | Freq: Every day | ORAL | Status: DC
  Administered 2019-09-13 – 2019-09-16 (×4): 60 mg via ORAL
  Filled 2019-09-12 (×4): qty 3

## 2019-09-12 MED ORDER — ACETAZOLAMIDE 250 MG PO TABS
250.0000 mg | ORAL_TABLET | Freq: Two times a day (BID) | ORAL | Status: DC
  Administered 2019-09-12: 250 mg via ORAL
  Filled 2019-09-12 (×2): qty 1

## 2019-09-12 MED ORDER — METOPROLOL SUCCINATE ER 25 MG PO TB24
25.0000 mg | ORAL_TABLET | Freq: Two times a day (BID) | ORAL | Status: DC
  Administered 2019-09-12 – 2019-09-29 (×17): 25 mg via ORAL
  Filled 2019-09-12 (×21): qty 1

## 2019-09-12 MED ORDER — MIDODRINE HCL 5 MG PO TABS
5.0000 mg | ORAL_TABLET | Freq: Three times a day (TID) | ORAL | Status: DC
  Administered 2019-09-12 – 2019-09-29 (×52): 5 mg via ORAL
  Filled 2019-09-12 (×49): qty 1

## 2019-09-12 MED ORDER — PREDNISONE 20 MG PO TABS: 30.0000 mg | ORAL_TABLET | Freq: Every day | ORAL | Status: DC

## 2019-09-12 MED ORDER — FUROSEMIDE 10 MG/ML IJ SOLN
80.0000 mg | Freq: Two times a day (BID) | INTRAMUSCULAR | Status: DC
  Administered 2019-09-12: 80 mg via INTRAVENOUS
  Filled 2019-09-12: qty 8

## 2019-09-12 NOTE — Progress Notes (Signed)
PROGRESS NOTE  Felicia [name] HYW:[id number] DOB: 10-14-1970 DOA: 08/08/2019 PCP: Aretta Nip, MD  HPI/Recap of past 24 hours: Patient was admitted to the hospital with working diagnosis of acute on chronic hypoxic respiratory failure, due to exacerbation of her autoimmune interstitial lung disease, NSIP flare, worsening right heart failure, and severe pulmonary hypertension with core pulmonale.  49 year old female with past medical history significant for nonspecific interstitial pneumonitis, autoimmune interstitial lung disease, chronic hypoxia on 2 concentrators 15L at baseline, severe pulmonary hypertension, right heart failure, type 2 diabetes mellitus, DVT on Eliquis who presented to [facility] with worsening dyspnea, associated with cough and orthopnea.On her initial physical examination she was in respiratory distress, respiratory rate 40 breaths/min, oxygen saturation 93% on 25 L high flow nasal cannula.Her lungs had diffuse rales bilaterally. Troponin I 206-193. BNP 1982, white count 13.8K, hemoglobin 13.8K, platelets 249K.SARS COVID-19 negative. Urinalysis 21-50 red cells, more than 50 white cells, specific gravity 1.012. Chest radiograph with bilateral interstitial infiltrates.EKG 89 bpm, rightward axis, prolonged QTC 500, sinus rhythm, no specific ST T changes.  Was started on aggressive diuresis and high dose IV steroids with improvement of her symptomatology. She initially required non rebreather 15 L/ min plus heated high flow nasal cannula 30 L/ min at 100% FiO2, she is being weaned as tolerated.   2D Echocardiogram done on 09/05/19 showed worsening RV systolic failure with severely reduced RV which has progressed from previous echo in March 2021.  Cardiology/Advanced Heart Failure team was consulted and assisting with the management.   CT chest done on 09/05/19 w/o contrast showed stable ILD pattern.Pulmonary was consulted and assisting with the  management.  Likely acute on chronic hypoxic respiratory failure is multifactorial 2/2 to profound RV dysfunction, acute on chronic cor pulmonale, severe pulmonary hypertension, and flare of NSIP.  Appreciate specialists assistance.  Patient requests transfer to [facility].  Her pulmonologist and cardiologist are from [facility]  09/11/19: She is on HFNC 30L/min at 80% FiO2 and NRB at her mouth with O2 saturation on the monitor in the room at 94%.  States breathing is improved.  Elevated CVP this AM 10-11, diuretic IV lasix 80 mg BID was held yesterday when CVP was 5-6. Reports abdominal discomfort and intermittent nausea with no vomiting.  Port abdominal xray and CXR obtained this morning: No evidence of bowel obstruction, moderate colonic stool burden. Unchanged findings of pulmonary fibrosis. No new focal consolidation identified.  [facility] declined transfer due to no capacity. Face sheet, CT, CXR and 2D echo were shared with [facility] attendings through radiology at their request.  09/12/19: States she feels better but still requiring HFNC 30L at 80% FiO2 and NRB 60%.  She is back on IV lasix 80 mg BID.  She states her appetite has been poor, requested regular diet.  Assessment/Plan: Principal Problem:   Acute on chronic respiratory failure with hypoxia (HCC) Active Problems:   Acute on chronic diastolic CHF (congestive heart failure) (HCC)   DNR (do not resuscitate) discussion   Interstitial lung disease ([place])   Hypokalemia   History of DVT (deep vein thrombosis)   Prolonged Q-T interval on ECG   Uncontrolled diabetes mellitus with hyperglycemia (HCC)   Elevated troponin   Pressure injury of skin   Palliative care by specialist   Hypoxia   Acute on chronic hypoxic respiratory failure, likely  multifactorial 2/2 to profound RV dysfunction, acute on                                      chronic cor pulmonale, severe pulmonary hypertension, and                              flare of NSIP. 2019 DLCO 17,  HRCT 2021 with progressive fibrotic ILD,  Right heart cath 04/2019 PA pressure 60/24 with mean 39. Follow up echocardiogram with severe and worsening reduction in RV systolic function, with severe elevated pulmonary artery systolic pressure, estimated 73 mmHg.  CT chest fibrosis pattern unchanged. Dyspnea improving with high doses IV diuretics and high doses IV steroids Currently on high flow nasal cannuladown to 30L/min with FiO2 80% coupled with a 60% NRB.  She uses NRB at her convenience whether she feels dyspneic or not.  Continue to wean off as tolerated.  Discussed with Dr. Delight Hoh at [facility] (09/11/19) regarding transfer, they are willing to consider.  Per transfer center coordinator, Rise Paganini, transportation can provide HFNC at her current setting 30L/min at 80% FiO2.  They will check to see if they can also couple with NRB if needed for transport.  The drive is close to 2 hours from [facility] depending on traffic.  Medical therapy with: IVMethylprednisolone60 bid, continued, po protonix 40 mg daily for GI ppx IVFurosemide80 bid held on 8/3 due to CVP, restarted on 8/5 due to elevated CVP 13,midodrine added due to soft Bps CVP 10-11 on 09/11/19, CVP 13 09/12/19  Sildenafil, continued, dose increased to 40 mg TID on 8/4 Mycophenolate, continued  Acute on chronic dCHF Elevated BNP, fluid overload, pulmonary edema Diuresing deferred to advanced heart failure team PICC line in place to monitor CVP, current CVP is elevated 13 IV lasix 80 BID  and midodrine for BP support Net I&O -7.8L On Atenolol Continue strict I&O and daily weight  Severe pulmonary hypertension/cor pulmonale Worsening right heart failure on 2D echo done on 09/05/2019 Received aggressive diuresing with IV Lasix 80 mg BID Continue sildenafil, dose increased, and low-dose atenolol 12.5 mg twice daily                           Was previously on Tyvaso for PAH,  stopped due to cough Cardiology/advanced heart failure team assistingwith management Continue Digoxin added 8/3 Follows with cardiology at [facility]  Autoimmune interstitial lung disease/nonspecific interstitial pneumonitis Follows with pulmonary at [facility] risk for decompensation Currently on CellCept and IV Solu-Medrol Pulmonary/Critical care medicine following, appreciate recommendations  Resolved post repletion: Hypokalemia/hyponatremia/hypomagnesemia, diuretic induced.Renal function stable with serum cr at 0.64 and GFR >60 Serum potassium 3.9. Serum magnesium 1.9.  Monitor electrolytes and replete as indicated  UncontrolledT2DMwith Hgb A1c 8,8 with hyperglycemia, likely exacerbated by IV steroids Currently on IV Solu-Medrol 60 mg twice daily Increase Lantus to 35 units nightly Continue NovoLog 3 units before meals Continue insulin sliding scale  History of DVT Diagnosed May 14, 2019  Continue Eliquis  Prolonged QTc. Twelve-lead EKG done on 09/05/2019, QTC 568 Repeat 12 lead EKG on 09/10/19 QTC 606 Avoid QTC prolonging agents Repeat twelve-lead EKG on 09/11/2019 Continue to closely monitor on progressive unit.   Left ear discomfort/recurrent sinusitis She is on Bactrim prophylactically Monday Wednesday Friday Bactrim on hold due to prolonged QTC  Continuedoxycycline100 mg twice daily x5days.  Obesity.  BMI 32  Weight loss outpatient once stable  Stage 2 pressure ulcer at the buttocks.Present on admission,Continue local wound care.  Chronic depression/anxiety Stable Continue Lexapro  Chronic constipation Continue bowel regimen senokot 2 tablets BID, Miralax 17 g BID Added supp dulcolax once if needed Moderate stool burden on abd xray If no improvement, consider smog enema  Goals of care PMT consulted to assist with establishing goals of care She is at high risk for decompensation due to her multiple comorbidities                            Per PMT, patient is open to all offered and available medical                                     interventions to prolong life. Ultimately she is hopeful for                                           transplant. Patient is full code    Patient continue to be at high risk forworsening hypoxic respiratory failure  Status is: Inpatient  Remains inpatient appropriate because:IV treatments appropriate due to intensity of illness.   Dispo: The patient is from:Home Anticipated d/c is ET:[id number] to [facility] center where she receives her pulmonary and cardiac care. Anticipated d/c date is: 09/16/19 Patient currently is not medically stable to d/c.Ongoing treatment for severe right heart failure and severe acute on chronic hypoxic respiratory failure requiring HFNC and NRB.  DVT prophylaxis:apixaban Code Status:fullcode Family Communication:None at bedside.   Nutrition Status:    Skin Documentation: Pressure Injury 09/04/19 Buttocks Left Stage 2 - Partial thickness loss of dermis presenting as a shallow open injury with a red, pink wound bed without slough. small circular size of pencil (Active)  09/04/19 0200  Location: Buttocks  Location Orientation: Left  Staging: Stage 2 - Partial thickness loss of dermis presenting as a shallow open injury with a red, pink wound bed without slough.  Wound Description (Comments): small circular size of pencil  Present on Admission: Yes     Consultants:  Pulmonary  Cardiology  Advanced heart team.         Procedures:  PICC line placement on 09/10/19 for CVP monitoring  Consultations:  Cardiology  Advanced heart failure team  Pulmonary/critical care medicine  Palliative care team       Objective: Vitals:   09/12/19 0801 09/12/19 0910 09/12/19 0916 09/12/19 1200  BP: 99/65   108/78  Pulse: 94 (!) 107 (!) 102 (!) 57   Resp: 20   18  Temp: 98 F (36.7 C)   (!) 97.5 F (36.4 C)  TempSrc: Axillary   Axillary  SpO2: 99%   100%  Weight:      Height:        Intake/Output Summary (Last 24 hours) at 09/12/2019 1443 Last data filed at 09/12/2019 0915 Gross per 24 hour  Intake 243 ml  Output 1200 ml  Net -957 ml   Filed Weights   09/09/19 0500 09/10/19 0403 09/11/19 0448  Weight: 89 kg 93 kg 86.2 kg    Exam:   General: 49 y.o. year-old female pleasant in no acute distress.  Alert and oriented x3.    Cardiovascular:  Regular rate and rhythm no rubs or gallops.    Respiratory:  Clear to auscultation no wheezes no rales.  Abdomen:  Soft nontender bowel sounds present.  Musculoskeletal:  No lower extremity edema bilaterally. Psychiatry: Mood is appropriate for condition and setting  Data Reviewed: CBC: Recent Labs  Lab 09/09/19 0715 09/10/19 1522 09/11/19 0631 09/12/19 0537  WBC 14.2* 18.1* 18.5* 18.7*  NEUTROABS 12.9* 16.3* 16.6*  --   HGB 12.7 12.3 12.2 13.4  HCT 43.6 42.3 42.2 46.1*  MCV 83.4 83.6 84.2 83.8  PLT 178 192 204 889   Basic Metabolic Panel: Recent Labs  Lab 09/07/19 0200 09/07/19 0200 09/08/19 0247 09/09/19 0715 09/10/19 0330 09/11/19 0526 09/12/19 0537  NA 132*   < > 135 138 134* 136 139  K 4.4   < > 3.8 3.2* 4.0 3.9 4.3  CL 94*   < > 93* 88* 89* 89* 85*  CO2 24   < > 30 39* 36* 36* 40*  GLUCOSE 317*   < > 308* 203* 148* 167* 155*  BUN 36*   < > 30* 25* 30* 30* 30*  CREATININE 0.85   < > 0.81 0.69 0.60 0.64 0.72  CALCIUM 8.3*   < > 8.3* 8.6* 8.7* 9.2 9.4  MG 2.2  --   --  2.0 2.0 1.9 2.0  PHOS  --   --   --  2.9  --   --  5.1*   < > = values in this interval not displayed.   GFR: Estimated Creatinine Clearance: 92.3 mL/min (by C-G formula based on SCr of 0.72 mg/dL). Liver Function Tests: Recent Labs  Lab 09/09/19 0715  AST 23  ALT 56*  ALKPHOS 196*  BILITOT 1.2  PROT 5.9*  ALBUMIN 3.6   No results for input(s): LIPASE, AMYLASE in the last 168  hours. No results for input(s): AMMONIA in the last 168 hours. Coagulation Profile: No results for input(s): INR, PROTIME in the last 168 hours. Cardiac Enzymes: No results for input(s): CKTOTAL, CKMB, CKMBINDEX, TROPONINI in the last 168 hours. BNP (last 3 results) No results for input(s): PROBNP in the last 8760 hours. HbA1C: No results for input(s): HGBA1C in the last 72 hours. CBG: Recent Labs  Lab 09/10/19 2130 09/11/19 0611 09/11/19 1109 09/11/19 1648 09/12/19 1216  GLUCAP 163* 167* 261* 147* 203*   Lipid Profile: No results for input(s): CHOL, HDL, LDLCALC, TRIG, CHOLHDL, LDLDIRECT in the last 72 hours. Thyroid Function Tests: No results for input(s): TSH, T4TOTAL, FREET4, T3FREE, THYROIDAB in the last 72 hours. Anemia Panel: No results for input(s): VITAMINB12, FOLATE, FERRITIN, TIBC, IRON, RETICCTPCT in the last 72 hours. Urine analysis:    Component Value Date/Time   COLORURINE AMBER (A) 09/04/2019 0744   APPEARANCEUR CLOUDY (A) 09/04/2019 0744   LABSPEC 1.012 09/04/2019 0744   PHURINE 6.0 09/04/2019 0744   GLUCOSEU >=500 (A) 09/04/2019 0744   HGBUR SMALL (A) 09/04/2019 0744   BILIRUBINUR NEGATIVE 09/04/2019 0744   KETONESUR NEGATIVE 09/04/2019 0744   PROTEINUR 30 (A) 09/04/2019 0744   UROBILINOGEN 0.2 12/24/2008 1604   NITRITE POSITIVE (A) 09/04/2019 0744   LEUKOCYTESUR LARGE (A) 09/04/2019 0744   Sepsis Labs: _0 (procalcitonin:4,lacticidven:4)  ) Recent Results (from the past 240 hour(s))  SARS Coronavirus 2 by RT PCR (hospital order, performed in [place] hospital lab) Nasopharyngeal Nasopharyngeal Swab     Status: None   Collection Time: 08/09/2019 11:01 PM   Specimen:  Nasopharyngeal Swab  Result Value Ref Range Status   SARS Coronavirus 2 NEGATIVE NEGATIVE Final    Comment: (NOTE) SARS-CoV-2 target nucleic acids are NOT DETECTED.  The SARS-CoV-2 RNA is generally detectable in upper and lower respiratory specimens during the acute phase of  infection. The lowest concentration of SARS-CoV-2 viral copies this assay can detect is 250 copies / mL. A negative result does not preclude SARS-CoV-2 infection and should not be used as the sole basis for treatment or other patient management decisions.  A negative result may occur with improper specimen collection / handling, submission of specimen other than nasopharyngeal swab, presence of viral mutation(s) within the areas targeted by this assay, and inadequate number of viral copies (<250 copies / mL). A negative result must be combined with clinical observations, patient history, and epidemiological information.  Fact Sheet for Patients:   StrictlyIdeas.no  Fact Sheet for Healthcare Providers: BankingDealers.co.za  This test is not yet approved or  cleared by the [place] FDA and has been authorized for detection and/or diagnosis of SARS-CoV-2 by FDA under an Emergency Use Authorization (EUA).  This EUA will remain in effect (meaning this test can be used) for the duration of the COVID-19 declaration under Section 564(b)(1) of the Act, 21 U.S.C. section 360bbb-3(b)(1), unless the authorization is terminated or revoked sooner.  Performed at [facility], [place] [address], [place], [place] [address]   Culture, blood (routine x 2)     Status: None   Collection Time: 09/04/19 12:47 AM   Specimen: BLOOD RIGHT HAND  Result Value Ref Range Status   Specimen Description BLOOD RIGHT HAND  Final   Special Requests   Final    BOTTLES DRAWN AEROBIC AND ANAEROBIC Blood Culture adequate volume   Culture   Final    NO GROWTH 5 DAYS Performed at [facility], [place] [address], [place], [place] [address]    Report Status 09/09/2019 FINAL  Final  Culture, blood (routine x 2)     Status: None   Collection Time: 09/04/19  3:53 AM   Specimen: BLOOD  Result Value Ref Range Status   Specimen Description BLOOD LEFT ARM  Final    Special Requests   Final    BOTTLES DRAWN AEROBIC AND ANAEROBIC Blood Culture adequate volume   Culture   Final    NO GROWTH 5 DAYS Performed at [place] [address], [place], [place] [address]    Report Status 09/09/2019 FINAL  Final  Urine culture     Status: Abnormal   Collection Time: 09/04/19  7:44 AM   Specimen: Urine, Random  Result Value Ref Range Status   Specimen Description URINE, RANDOM  Final   Special Requests   Final    NONE Performed at [facility], [place] [address] [place],  [address]    Culture MULTIPLE SPECIES PRESENT, SUGGEST RECOLLECTION (A)  Final   Report Status 09/05/2019 FINAL  Final  MRSA PCR Screening     Status: None   Collection Time: 09/04/19  9:15 AM   Specimen: Nasal Mucosa; Nasopharyngeal  Result Value Ref Range Status   MRSA by PCR NEGATIVE NEGATIVE Final    Comment:        The GeneXpert MRSA Assay (FDA approved for NASAL specimens only), is one component of a comprehensive MRSA colonization surveillance program. It is not intended to diagnose MRSA infection nor to guide or monitor treatment for MRSA infections. Performed at [facility]  [facility], [place] [address] [place], [place] [address]       Studies: No results found.  Scheduled Meds: . apixaban  5 mg Oral BID  . atenolol  12.5 mg Oral BID  . Chlorhexidine Gluconate Cloth  6 each Topical Daily  . digoxin  0.125 mg Oral Daily  . doxycycline  100 mg Oral Q12H  . escitalopram  10 mg Oral Daily  . furosemide  80 mg Intravenous BID  . insulin aspart  0-15 Units Subcutaneous TID WC  . insulin aspart  0-5 Units Subcutaneous QHS  . insulin aspart  3 Units Subcutaneous TID WC  . insulin glargine  35 Units Subcutaneous QHS  . ipratropium-albuterol  3 mL Nebulization TID  . midodrine  5 mg Oral TID WC  . multivitamin with minerals  1 tablet Oral Daily  . mycophenolate  1,500 mg Oral BID  . pantoprazole  40 mg Oral Daily  . polyethylene glycol  17 g Oral BID    . [START ON 09/13/2019] predniSONE  40 mg Oral Q breakfast  . Ensure Max Protein  11 oz Oral BID  . senna-docusate  2 tablet Oral BID  . sildenafil  40 mg Oral TID  . sodium chloride flush  10-40 mL Intracatheter Q12H  . sodium chloride flush  3 mL Intravenous Q12H    Continuous Infusions: . sodium chloride 10 mL/hr at 09/05/19 0253     LOS: 9 days     Kayleen Memos, MD [facility] Pager [phone]  If 7PM-7AM, please contact night-coverage [email or web address] Password [facility] 09/12/2019, 2:43 PM

## 2019-09-12 NOTE — Progress Notes (Signed)
Name: Felicia [name] MRN: [id number] DOB: 1971/01/23    ADMISSION DATE:  08/18/2019 CONSULTATION DATE:  09/12/2019   REFERRING MD :  Delphia Grates, triad  CHIEF COMPLAINT: Respiratory distress, worsening hypoxia  BRIEF PATIENT DESCRIPTION: 49 year old with fibrotic NSIP and severe pulmonary hypertension and chronic hypoxic respiratory failure maintained on 10 to 15 L oxygen at home, admitted with worsening hypoxia.  She follows up at [facility] with Dr. Randol Kern and Dr. [name]  SIGNIFICANT EVENTS  7/28 PCCM consult 7/30 palliative care consult. Patient remains very steadfast in hoping for ongoing pulmonary improvement  8/2 slight decrease in O2 support   STUDIES:  Echocardiogram 04/2019 normal LV function, RVSP 64 , enlarged RV, bubble study negative.  PFTs 02/2017 FVC 1.25 L 36% predicted, FEV1 1.18 L 40% predicted with DLCO of 17   HRCT 02/2019 chronic fibrotic ILD progressed from 2018 with mild focus of honeycombing  Cath 3/ 2021right atrial pressure was 7, PA pressure 60/24 with a mean of 39 and a wedge of 10 her cardiac index was low at 1.91 and output of 3.89. Her PVR is thus 7.  HISTORY OF PRESENT ILLNESS: She has a history of ILD/biopsy-proven fibrotic NSIP dating back to 2010.  Serology has been negative in 2011 except for mildly elevated aldolase and she has been maintained on immunosuppressants, CellCept and prednisone, also Rituxan 6 monthly.  Follows with Dr. Randol Kern at [facility].  She has severe pulmonary hypertension, follows with Dr. [name] at [facility] and is maintained on inhaled treprostinil and sildenafil.  Right heart cath from earlier this year as listed above. She reports worsening dyspnea over the past few days, a dry cough and worsening hypoxia for which she sought attention in the ED.  Reports falls, normally ambulates with a walker.  On further questioning she states that worsening may date back a few weeks Initial evaluation showed mild leukocytosis 13.8, lactate of 5.4, BNP 1982,  she required high flow nasal cannula with 20 L oxygen and 100%.  She was given Lasix , diuresed 1 L but then developed hypotension overnight and was given a 500 cc fluid bolus.  Empirically started on antibiotics  Prior [facility] records from Dr. [name] were reviewed , and prior office visit with my partner Dr. Gala Murdoch .  She has been advised weight loss prior to being listed for lung transplant. Prior hospitalization from 05/2019 and discharge summary was reviewed, treated with diuresis and systemic steroids with some improvement SUBJECTIVE:  No acute distress at rest.  Largest complaint is constipation for which he is receiving Dulcolax.  Although she continues to IV narcotics for cyclic pain   VITAL SIGNS: Temp:  [96.4 F (35.8 C)-98 F (36.7 C)] 98 F (36.7 C) (08/05 0801) Pulse Rate:  [88-101] 94 (08/05 0801) Resp:  [16-26] 20 (08/05 0801) BP: (99-123)/(58-94) 99/65 (08/05 0801) SpO2:  [91 %-99 %] 99 % (08/05 0801) FiO2 (%):  [80 %] 80 % (08/05 0735)  PHYSICAL EXAMINATION: General: Obese female who is in no acute distress able to carry on conversations without difficulty HEENT: No JVD or lymphadenopathy is appreciated. Neuro: Grossly intact without focal defects CV: Heart sounds are regular whirring rate rhythm PULM: High flow nasal cannula at 80%.  Nonrebreather at 60% with O2 sats of 96%.  Decreased breath sounds throughout  Ct 09/05/19  GI: soft, bsx4 active, complains of constipation which is improved with Dulcolax GU: Voids Extremities: warm/dry, negative edema  Skin: no rashes or lesions       Recent Labs  Lab 09/10/19 0330 09/11/19 0526 09/12/19 0537  NA 134* 136 139  K 4.0 3.9 4.3  CL 89* 89* 85*  CO2 36* 36* 40*  BUN 30* 30* 30*  CREATININE 0.60 0.64 0.72  GLUCOSE 148* 167* 155*   Recent Labs  Lab 09/10/19 1522 09/11/19 0631 09/12/19 0537  HGB 12.3 12.2 13.4  HCT 42.3 42.2 46.1*  WBC 18.1* 18.5* 18.7*  PLT 192 204 220   DG Chest Port 1 View  Result  Date: 09/11/2019 CLINICAL DATA:  Hypoxia EXAM: PORTABLE CHEST 1 VIEW COMPARISON:  September 08, 2019 FINDINGS: The cardiomediastinal silhouette is unchanged and enlarged in contour.RIGHT upper extremity PICC with tip terminating over the superior cavoatrial junction. No pleural effusion. No pneumothorax. Unchanged coarse reticular opacities consistent with underlying pulmonary fibrosis. No new focal consolidation identified. Visualized abdomen is unremarkable. Multilevel degenerative changes of the thoracic spine. IMPRESSION: Unchanged findings of pulmonary fibrosis. No new focal consolidation identified. Electronically Signed   By: Valentino Saxon MD   On: 09/11/2019 10:17   DG Abd Portable 1V  Result Date: 09/11/2019 CLINICAL DATA:  Abdominal pain EXAM: PORTABLE ABDOMEN - 1 VIEW COMPARISON:  September 05, 2019 FINDINGS: Paucity of bowel gas limits evaluation for obstruction. Incomplete assessment of the RIGHT lateral soft tissues. Air is visualized in the rectum. Moderate colonic stool burden. No dilated loops of bowel are seen. Coarse interstitial opacities at the lung bases consistent with known pulmonary fibrosis. Degenerative changes of the lower lumbar spine. IMPRESSION: 1. Paucity of bowel gas limits evaluation for obstruction. Within the limitations, no evidence of bowel obstruction. 2. Moderate colonic stool burden. Electronically Signed   By: Valentino Saxon MD   On: 09/11/2019 10:19    ASSESSMENT / PLAN:  Acute on chronic hypoxic respiratory failure -baseline on 10 to 15 L of oxygen, has 2 concentrators at home. Fibrotic NSIP -anti-Jo positive, on immunosuppressants.  Question acute flare.  Being treated with corticosteroids.  I suspect that this presentation is more decompensation of her pulmonary hypertension with profound RV dysfunction as opposed to a flare of her ILD. Severe pulmonary hypertension and cor pulmonale -was on Tyvaso and sildenafil, has stopped the Tyvaso due to cough.  Question  whether this is contributed in part to her decompensation, volume overload -transfer to [facility] has been discussed, this could be accomplished if transport can provide adequate oxygen delivery, either 1.00 NRB or high flow nasal cannula.  Question whether they may start IV prostanoids P CVP noted to be 16 therefore resumption of diuresis Continues on high flow oxygen 80% with a nonrebreather with 2 tabs off equally about 6% addition to her 80% high flow nasal cannula with sats of 96%.  Nonrebreather is at the patient's request for comfort. Remains on steroids for presumed IPF flare Continue Rubano for pulmonary hypertension Transferred to [facility] has been declined at this time by [facility] Complaints of constipation retreated with Dulcolax patient continues to request morphine for pain. Pulmonary will continue to follow   [place] Acute Care Nurse Practitioner [place] Please consult Amion 09/12/2019, 8:24 AM

## 2019-09-12 NOTE — Progress Notes (Addendum)
Advanced Heart Failure Rounding Note  PCP-Cardiologist: Minus Breeding, MD   Subjective:    Transfer to [facility] declined. No beds available.   Remains on HFNC 30 with NRB as needed.   On 09/10/19 sildenafil 40 mg tid added. BP 99/65.   -1.5L in UOP yesterday. SCr 0.72. CO2 40.   CVP 13.  Co-ox 58%.   Feeling a bit better today. No abdominal pain   Objective:   Weight Range: 86.2 kg Body mass index is 31.62 kg/m.   Vital Signs:   Temp:  [96.4 F (35.8 C)-98 F (36.7 C)] 98 F (36.7 C) (08/05 0801) Pulse Rate:  [88-101] 94 (08/05 0801) Resp:  [16-26] 20 (08/05 0801) BP: (99-123)/(58-94) 99/65 (08/05 0801) SpO2:  [91 %-99 %] 99 % (08/05 0801) FiO2 (%):  [80 %] 80 % (08/05 0735) Last BM Date: 09/11/19  Weight change: Filed Weights   09/09/19 0500 09/10/19 0403 09/11/19 0448  Weight: 89 kg 93 kg 86.2 kg    Intake/Output:   Intake/Output Summary (Last 24 hours) at 09/12/2019 0850 Last data filed at 09/11/2019 1900 Gross per 24 hour  Intake --  Output 1550 ml  Net -1550 ml      Physical Exam    CVP 13 personally checked.  General: chronically ill appearing on NRB HEENT: Normal Neck: Supple. JVP 12 cm. Carotids 2+ bilat; no bruits. No lymphadenopathy or thyromegaly appreciated. Cor: PMI nondisplaced. Regular rate & rhythm. No rubs, gallops or murmurs. Lungs: Decreased in the bases.  Abdomen: Soft, nontender, nondistended. No hepatosplenomegaly. No bruits or masses. Good bowel sounds. Extremities: No cyanosis, clubbing, rash, edema + RUE PICC  Neuro: Alert & orientedx3, cranial nerves grossly intact. moves all 4 extremities w/o difficulty. Affect pleasant   Telemetry   SR 90s-low 100s   EKG    n/a  Labs    CBC Recent Labs    09/10/19 1522 09/10/19 1522 09/11/19 0631 09/12/19 0537  WBC 18.1*   < > 18.5* 18.7*  NEUTROABS 16.3*  --  16.6*  --   HGB 12.3   < > 12.2 13.4  HCT 42.3   < > 42.2 46.1*  MCV 83.6   < > 84.2 83.8  PLT 192   < > 204 220    < > = values in this interval not displayed.   Basic Metabolic Panel Recent Labs    09/11/19 0526 09/12/19 0537  NA 136 139  K 3.9 4.3  CL 89* 85*  CO2 36* 40*  GLUCOSE 167* 155*  BUN 30* 30*  CREATININE 0.64 0.72  CALCIUM 9.2 9.4  MG 1.9 2.0  PHOS  --  5.1*   Liver Function Tests No results for input(s): AST, ALT, ALKPHOS, BILITOT, PROT, ALBUMIN in the last 72 hours. No results for input(s): LIPASE, AMYLASE in the last 72 hours. Cardiac Enzymes No results for input(s): CKTOTAL, CKMB, CKMBINDEX, TROPONINI in the last 72 hours.  BNP: BNP (last 3 results) Recent Labs    05/31/19 0025 08/22/2019 2141 09/09/19 0715  BNP 1,085.4* 1,982.4* 544.6*    ProBNP (last 3 results) No results for input(s): PROBNP in the last 8760 hours.   D-Dimer No results for input(s): DDIMER in the last 72 hours. Hemoglobin A1C No results for input(s): HGBA1C in the last 72 hours. Fasting Lipid Panel No results for input(s): CHOL, HDL, LDLCALC, TRIG, CHOLHDL, LDLDIRECT in the last 72 hours. Thyroid Function Tests No results for input(s): TSH, T4TOTAL, T3FREE, THYROIDAB in the last 72  hours.  Invalid input(s): FREET3  Other results:   Imaging    DG Chest Port 1 View  Result Date: 09/11/2019 CLINICAL DATA:  Hypoxia EXAM: PORTABLE CHEST 1 VIEW COMPARISON:  September 08, 2019 FINDINGS: The cardiomediastinal silhouette is unchanged and enlarged in contour.RIGHT upper extremity PICC with tip terminating over the superior cavoatrial junction. No pleural effusion. No pneumothorax. Unchanged coarse reticular opacities consistent with underlying pulmonary fibrosis. No new focal consolidation identified. Visualized abdomen is unremarkable. Multilevel degenerative changes of the thoracic spine. IMPRESSION: Unchanged findings of pulmonary fibrosis. No new focal consolidation identified. Electronically Signed   By: Valentino Saxon MD   On: 09/11/2019 10:17   DG Abd Portable 1V  Result Date:  09/11/2019 CLINICAL DATA:  Abdominal pain EXAM: PORTABLE ABDOMEN - 1 VIEW COMPARISON:  September 05, 2019 FINDINGS: Paucity of bowel gas limits evaluation for obstruction. Incomplete assessment of the RIGHT lateral soft tissues. Air is visualized in the rectum. Moderate colonic stool burden. No dilated loops of bowel are seen. Coarse interstitial opacities at the lung bases consistent with known pulmonary fibrosis. Degenerative changes of the lower lumbar spine. IMPRESSION: 1. Paucity of bowel gas limits evaluation for obstruction. Within the limitations, no evidence of bowel obstruction. 2. Moderate colonic stool burden. Electronically Signed   By: Valentino Saxon MD   On: 09/11/2019 10:19     Medications:     Scheduled Medications: . apixaban  5 mg Oral BID  . atenolol  12.5 mg Oral BID  . Chlorhexidine Gluconate Cloth  6 each Topical Daily  . digoxin  0.125 mg Oral Daily  . doxycycline  100 mg Oral Q12H  . escitalopram  10 mg Oral Daily  . furosemide  40 mg Intravenous BID  . insulin aspart  0-15 Units Subcutaneous TID WC  . insulin aspart  0-5 Units Subcutaneous QHS  . insulin aspart  3 Units Subcutaneous TID WC  . insulin glargine  35 Units Subcutaneous QHS  . ipratropium-albuterol  3 mL Nebulization TID  . methylPREDNISolone (SOLU-MEDROL) injection  60 mg Intravenous Q12H  . midodrine  2.5 mg Oral TID WC  . multivitamin with minerals  1 tablet Oral Daily  . mycophenolate  1,500 mg Oral BID  . pantoprazole  40 mg Oral Daily  . polyethylene glycol  17 g Oral BID  . Ensure Max Protein  11 oz Oral BID  . senna-docusate  2 tablet Oral BID  . sildenafil  40 mg Oral TID  . sodium chloride flush  10-40 mL Intracatheter Q12H  . sodium chloride flush  3 mL Intravenous Q12H    Infusions: . sodium chloride 10 mL/hr at 09/05/19 0253    PRN Medications: sodium chloride, acetaminophen, albuterol, guaiFENesin-dextromethorphan, hydrocortisone, morphine injection, simethicone, sodium chloride  flush, sodium chloride flush     Assessment/Plan   1. A/C Hypoxic Respiratory Failure - multifactorial with NSIP, PAH, [place] - On steroids + cellcept + nebs + HFNC 30 liters   2. A/C Cor Pulmonale ECHO EF 55% Severely reduced RV which has progressed from previous ECHO in March 2021 Also no shunt noted on bubble study.  - CVP 13.  Continue IV Lasix 40 mg bid - Increase midodrine to 5 mg tid for BP support - Consider adding Diamox  - continue digoxin 0.125 mg for RV support   3. [place] Followed [facility] by Dr [name]. She has been followed at [facility], thought to have mixed group 1 and group 3 (ILD/NSIP) PH.  - Most recent [place] 04/2019  with PVR 7 and CI 1.9. Not sure how much RHC will help.  - In the past she was on Tyvaso but stopped due to increased shortness of breath/cough. - On 8/3 sildenafil increased to 40 mg tid.  - Continue midodrine. Increase to 5 mg three times a day.    4. SVT  - currently NSR - Rate controlled.  - Continue atenolol   5. Hypokalemia  - resolved, K 4.3 today   6. H/O DVT - On eliquis 5 mg twice a day.   7. Goals of Care - Palliative consulted. Current Full Code.     Length of Stay: [address], PA-C  09/12/2019, 8:50 AM  Advanced Heart Failure Team Pager [phone] (M-F; 7a - 4p)  Please contact [facility] for night-coverage after hours (4p -7a ) and weekends on [email or web address]  Patient seen with PA, agree with the above note.   She is still on HFNC 30 L but says she feels better. CVP down to 5 this evening.  Co-ox 58%.    General: NAD Neck: Thick no JVD, no thyromegaly or thyroid nodule.  Lungs: Distant BS.  CV: Nondisplaced PMI.  Heart regular S1/S2, no S3/S4, no murmur.  No peripheral edema.   Abdomen: Soft, nontender, no hepatosplenomegaly, no distention.  Skin: Intact without lesions or rashes.  Neurologic: Alert and oriented x 3.  Psych: Normal affect. Extremities: No clubbing or cyanosis.  HEENT: Normal.   1. Acute  on chronic hypoxemic respiratory failure: Multifactorial. Has ILD/NSIP with possible flare as well as pulmonary hypertension/RV failure. At baseline, on 10-15 L HFNC. She is now on 30 L HFNC.  - Continue treatment of ILD flare per pulmonary, switched from Solumedrol to po prednisone today, continuing mycophenolate. - CVP down to 5 this afternoon, can stop IV Lasix after today, will start torsemide 60 mg daily tomorrow.  2. Pulmonary hypertension: She has been followed at [facility], thought to have mixed group 1 and group 3 (ILD/NSIP) PH. She has been on sildenafiland was onTyvasoin the past. INCREASE study would suggest benefit for Tyvaso in patients like her. She does not like Tyvaso because it seems to trigger coughing=> she has stopped it again and refuses to restart. Echo shows D-shaped interventricular septum withseverelydilated and severely dysfunctional RV with estimated PASP 53mHg, moderate-severe TR, LV EF 50-55%, dilated IVC. Bubble study negativein the past.Volume status improved with CVP 5.  Co-ox 58%.   - Continue sildenafil, have increased to 40 mg tid.  -She will not restart Tyvaso. Question efficacy of other selective pulmonary vasodilators given severe ILD and risk for V/Q mismatch (so not sure how helpful IV Flolan would be). -Co-ox adequate now, will not start milrinone for RV support for the time being.   - Added digoxin 0.125 mg daily for RV.  - Increase midodrine to 5 mg tid for soft BP. 3. RV failure:Volume status improved, CVP down to 5.  - Stop IV Lasix after today, begin torsemide 60 mg daily.  4. H/o SVT: On atenolol, quiescent. 5. H/o DVT in 2020: On Eliquis 5 mg bid. 6. ILD: NSIP. She is on steroids + Cellcept at home, [name]  7. MAT/wandering atrial pacemaker: Noted on telemetry. Would use Toprol XL rather than atenolol, will transition.   DLoralie Champagne8/06/2019 6:09 PM

## 2019-09-12 NOTE — Progress Notes (Signed)
   09/12/19 1602  Clinical Encounter Type  Visited With Patient;Family  Visit Type Follow-up;Spiritual support  Referral From Palliative care team  Consult/Referral To Chaplain  Spiritual Encounters  Spiritual Needs Prayer  This chaplain was present for Pt. requested F/U spiritual care. The Pt. greeted the chaplain with, "I have a question for you."  The broad topic of conversation with the Pt. was prayer.  As the Pt. asked questions the chaplain heard the Pt. willingness to explore a new spirituality with this hospitalization. The Pt.described herself as physically comfortable and announced her mother is visiting today.  The Pt. and chaplain agreed upon another spiritual care visit.

## 2019-09-12 NOTE — Progress Notes (Signed)
Occupational Therapy Treatment Patient Details Name: Felicia [name] MRN: [id number] DOB: 09/04/1970 Today's Date: 09/12/2019    History of present illness Patient is a 49 y/o female who presents with SOB, cough, weakness. 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 PMH includes nonspecific interstitial pneumonitis, DVT, DM, pulmonary HTN, diastolic HF, chronic hypoxic respiratory failure on 12-15L/min 02 [place]   OT comments  Patient continues to make steady progress towards goals in skilled OT session. Patient's session was limited due to the fact that pt had frequent BMs throughout the night and into the morning requiring significant clean up. Pt remains motivated, however was unwilling to get OOB in session. Session focus on graded mobility exercises through all extremities in session and bed mobility to complete peri-care. Discharge remains appropriate, but may need to consider SNF placement if pt does not begin to make progress; will continue to follow acutely.    Follow Up Recommendations  Home health OT;Supervision/Assistance - 24 hour;Other (comment) (Pending progress, may need increased rehab)    Equipment Recommendations  3 in 1 bedside commode    Recommendations for Other Services      Precautions / Restrictions Precautions Precautions: Fall Precaution Comments: watch 02, on 30L HF, hx of fall this admission, nonrebreather used as needed by pt Restrictions Weight Bearing Restrictions: No       Mobility Bed Mobility Overal bed mobility: Needs Assistance Bed Mobility: Rolling Rolling: Min assist         General bed mobility comments: pt initiate, but needs truncal assist and minor LE assist until she can reach the rail  Transfers                 General transfer comment: deferred    Balance                                            ADL either performed or assessed with clinical judgement   ADL Overall ADL's : Needs assistance/impaired                             Toileting- Clothing Manipulation and Hygiene: Total assistance;Bed level         General ADL Comments: limited/bed level treat completed only as pt had been having BMs all night, however motivated to complete bed level exercises     Vision       Perception     Praxis      Cognition Arousal/Alertness: Awake/alert Behavior During Therapy: WFL for tasks assessed/performed Overall Cognitive Status: Within Functional Limits for tasks assessed                                          Exercises Exercises: General Lower Extremity;Other exercises;General Upper Extremity General Exercises - Lower Extremity Ankle Circles/Pumps: AROM;Both;20 reps;Supine Hip Flexion/Marching: AAROM;Strengthening;Both;10 reps;Supine Other Exercises Other Exercises: bicep/tricep pressess with graded resistance x 10 reps and shoulder flexion (reaching alternately to the ceiling) x 10 reps.   Shoulder Instructions       General Comments      Pertinent Vitals/ Pain       Pain Assessment:  Faces Faces Pain Scale: Hurts a little bit Pain Location: general Pain Intervention(s): Monitored during session;Limited activity within patient's tolerance  Home Living                                          Prior Functioning/Environment              Frequency  Min 2X/week        Progress Toward Goals  OT Goals(current goals can now be found in the care plan section)  Progress towards OT goals: Progressing toward goals  Acute Rehab OT Goals Patient Stated Goal: to be able to breathe OT Goal Formulation: With patient Time For Goal Achievement: 09/23/19 Potential to Achieve Goals: [place] Discharge plan remains appropriate    Co-evaluation    PT/OT/SLP Co-Evaluation/Treatment:  Yes Reason for Co-Treatment: Complexity of the patient's impairments (multi-system involvement);For patient/therapist safety PT goals addressed during session: Mobility/safety with mobility OT goals addressed during session: ADL's and self-care;Strengthening/ROM      AM-PAC OT "6 Clicks" Daily Activity     Outcome Measure   Help from another person eating meals?: None Help from another person taking care of personal grooming?: A Little Help from another person toileting, which includes using toliet, bedpan, or urinal?: Total Help from another person bathing (including washing, rinsing, drying)?: A Lot Help from another person to put on and taking off regular upper body clothing?: A Lot Help from another person to put on and taking off regular lower body clothing?: Total 6 Click Score: 13    End of Session Equipment Utilized During Treatment: Oxygen  OT Visit Diagnosis: Muscle weakness (generalized) (M62.81);Other (comment)   Activity Tolerance Patient limited by fatigue;Patient limited by lethargy   Patient Left in bed;with call bell/phone within reach   Nurse Communication Mobility status;Need for lift equipment        Time: 0355-9741 OT Time Calculation (min): 27 min  Charges: OT General Charges $OT Visit: 1 Visit OT Treatments $Self Care/Home Management : 8-22 mins  Corinne [place]. [place], [place] [facility] [phone] [place] 09/12/2019, 3:24 PM

## 2019-09-12 NOTE — Progress Notes (Signed)
Physical Therapy Treatment Patient Details Name: Felicia [name] MRN: [id number] DOB: 1970-11-23 Today's Date: 09/12/2019    History of Present Illness Patient is a 49 y/o female who presents with SOB, cough, weakness. 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 PMH includes nonspecific interstitial pneumonitis, DVT, DM, pulmonary HTN, diastolic HF, chronic hypoxic respiratory failure on 12-15L/min 02 [place]    PT Comments    Pt was in better spirits, but due to hours of dealing with runny stools and being tired, asked to not get OOB today.  Emphasis on rolling for peri care and graded resistance exercise or Upper and LE's    Follow Up Recommendations  Home health PT;Supervision for mobility/OOB;Supervision/Assistance - 24 hour     Equipment Recommendations  None recommended by PT    Recommendations for Other Services       Precautions / Restrictions Precautions Precautions: Fall Precaution Comments: watch 02, on 30L HF, hx of fall this admission, nonrebreather used as needed by pt    Mobility  Bed Mobility Overal bed mobility: Needs Assistance Bed Mobility: Rolling Rolling: Min assist         General bed mobility comments: pt initiate, but needs truncal assist and minor LE assist until she can reach the rail  Transfers                 General transfer comment: deferred  Ambulation/Gait                 Stairs             Wheelchair Mobility    Modified Rankin (Stroke Patients Only)       Balance                                            Cognition Arousal/Alertness: Awake/alert Behavior During Therapy: WFL for tasks assessed/performed Overall Cognitive Status: Within Functional Limits for tasks assessed                                        Exercises General  Exercises - Lower Extremity Ankle Circles/Pumps: AROM;Both;20 reps;Supine Hip Flexion/Marching: AAROM;Strengthening;Both;10 reps;Supine (graded resistance in gross extension) Other Exercises Other Exercises: bicep/tricep pressess with graded resistance x 10 reps and shoulder flexion (reaching alternately to the ceiling) x 10 reps.    General Comments        Pertinent Vitals/Pain Pain Assessment: Faces Faces Pain Scale: Hurts a little bit Pain Location: general Pain Intervention(s): Monitored during session    Home Living                      Prior Function            PT Goals (current goals can now be found in the care plan section) Acute Rehab PT Goals Patient Stated Goal: to be able to breathe PT Goal Formulation: With patient Time For Goal Achievement: 09/23/19 Potential to Achieve Goals: Fair Progress towards PT goals: Not progressing toward goals - comment (wanted 1 more day before OOB)    Frequency    Min 3X/week      PT Plan Current plan remains appropriate  Co-evaluation PT/OT/SLP Co-Evaluation/Treatment: Yes Reason for Co-Treatment: Complexity of the patient's impairments (multi-system involvement);For patient/therapist safety PT goals addressed during session: Mobility/safety with mobility        AM-PAC PT "6 Clicks" Mobility   Outcome Measure  Help needed turning from your back to your side while in a flat bed without using bedrails?: A Little Help needed moving from lying on your back to sitting on the side of a flat bed without using bedrails?: A Lot Help needed moving to and from a bed to a chair (including a wheelchair)?: A Lot Help needed standing up from a chair using your arms (e.g., wheelchair or bedside chair)?: A Lot Help needed to walk in hospital room?: A Lot Help needed climbing 3-5 steps with a railing? : Total 6 Click Score: 12    End of Session Equipment Utilized During Treatment: Oxygen Activity Tolerance: Patient  limited by fatigue;Patient tolerated treatment well Patient left: in bed;with call bell/phone within reach;with bed alarm set Nurse Communication: Mobility status PT Visit Diagnosis: Other abnormalities of gait and mobility (R26.89);Muscle weakness (generalized) (M62.81)     Time: 4695-0722 PT Time Calculation (min) (ACUTE ONLY): 27 min  Charges:  $Therapeutic Exercise: 8-22 mins                     09/12/2019  Ginger Carne., PT Acute Rehabilitation Services [phone]  (pager) [phone]  (office)   Tessie Fass Firmin [place] 09/12/2019, 1:33 PM

## 2019-09-13 DIAGNOSIS — I4891 Unspecified atrial fibrillation: Secondary | ICD-10-CM

## 2019-09-13 DIAGNOSIS — R109 Unspecified abdominal pain: Secondary | ICD-10-CM

## 2019-09-13 DIAGNOSIS — I4819 Other persistent atrial fibrillation: Secondary | ICD-10-CM

## 2019-09-13 LAB — BASIC METABOLIC PANEL
Anion gap: 13 (ref 5–15)
Anion gap: 13 (ref 5–15)
BUN: 37 mg/dL — ABNORMAL HIGH (ref 6–20)
BUN: 31 mg/dL — ABNORMAL HIGH (ref 6–20)
CO2: 40 mmol/L — ABNORMAL HIGH (ref 22–32)
CO2: 36 mmol/L — ABNORMAL HIGH (ref 22–32)
Calcium: 9 mg/dL (ref 8.9–10.3)
Calcium: 8.3 mg/dL — ABNORMAL LOW (ref 8.9–10.3)
Chloride: 84 mmol/L — ABNORMAL LOW (ref 98–111)
Chloride: 86 mmol/L — ABNORMAL LOW (ref 98–111)
Creatinine, Ser: 0.73 mg/dL (ref 0.44–1.00)
Creatinine, Ser: 0.73 mg/dL (ref 0.44–1.00)
GFR calc Af Amer: >60 mL/min (ref >60)
GFR calc Af Amer: >60 mL/min (ref >60)
GFR calc non Af Amer: >60 mL/min (ref >60)
GFR calc non Af Amer: >60 mL/min (ref >60)
Glucose, Bld: 124 mg/dL — ABNORMAL HIGH (ref 70–99)
Glucose, Bld: 252 mg/dL — ABNORMAL HIGH (ref 70–99)
Potassium: 2.9 mmol/L — ABNORMAL LOW (ref 3.5–5.1)
Potassium: 3.1 mmol/L — ABNORMAL LOW (ref 3.5–5.1)
Sodium: 137 mmol/L (ref 135–145)
Sodium: 135 mmol/L (ref 135–145)

## 2019-09-13 LAB — GLUCOSE, CAPILLARY
Glucose-Capillary: 139 mg/dL — ABNORMAL HIGH (ref 70–99)
Glucose-Capillary: 244 mg/dL — ABNORMAL HIGH (ref 70–99)
Glucose-Capillary: 344 mg/dL — ABNORMAL HIGH (ref 70–99)
Glucose-Capillary: 246 mg/dL — ABNORMAL HIGH (ref 70–99)

## 2019-09-13 LAB — TSH: TSH: 3.53 u[IU]/mL (ref 0.350–4.500)

## 2019-09-13 LAB — COOXEMETRY PANEL
Carboxyhemoglobin: 1.4 % (ref 0.5–1.5)
Methemoglobin: 0.9 % (ref 0.0–1.5)
O2 Saturation: 62.3 %
Total hemoglobin: 13.7 g/dL (ref 12.0–16.0)

## 2019-09-13 LAB — MAGNESIUM: Magnesium: 1.7 mg/dL (ref 1.7–2.4)

## 2019-09-13 MED ORDER — NOREPINEPHRINE 4 MG/250ML-% IV SOLN
0.0000 ug/min | INTRAVENOUS | Status: DC
  Administered 2019-09-13: 2 ug/min via INTRAVENOUS
  Filled 2019-09-13: qty 250

## 2019-09-13 MED ORDER — POTASSIUM CHLORIDE 10 MEQ/50ML IV SOLN
10.0000 meq | INTRAVENOUS | Status: DC
  Administered 2019-09-13: 10 meq via INTRAVENOUS
  Filled 2019-09-13 (×2): qty 50

## 2019-09-13 MED ORDER — SODIUM CHLORIDE 0.9 % IV BOLUS
250.0000 mL | Freq: Once | INTRAVENOUS | Status: AC
  Administered 2019-09-13: 250 mL via INTRAVENOUS

## 2019-09-13 MED ORDER — POTASSIUM CHLORIDE CRYS ER 20 MEQ PO TBCR
40.0000 meq | EXTENDED_RELEASE_TABLET | Freq: Once | ORAL | Status: AC
  Administered 2019-09-13: 40 meq via ORAL
  Filled 2019-09-13: qty 2

## 2019-09-13 MED ORDER — POTASSIUM CHLORIDE 10 MEQ/50ML IV SOLN
10.0000 meq | INTRAVENOUS | Status: AC
  Administered 2019-09-13 (×3): 10 meq via INTRAVENOUS
  Filled 2019-09-13 (×2): qty 50

## 2019-09-13 MED ORDER — AMIODARONE IV BOLUS ONLY 150 MG/100ML
150.0000 mg | Freq: Once | INTRAVENOUS | Status: AC
  Administered 2019-09-13: 150 mg via INTRAVENOUS
  Filled 2019-09-13: qty 100

## 2019-09-13 MED ORDER — AMIODARONE HCL IN DEXTROSE 360-4.14 MG/200ML-% IV SOLN
60.0000 mg/h | INTRAVENOUS | Status: AC
  Administered 2019-09-13: 60 mg/h via INTRAVENOUS
  Filled 2019-09-13: qty 200

## 2019-09-13 MED ORDER — MAGNESIUM SULFATE 2 GM/50ML IV SOLN
2.0000 g | Freq: Once | INTRAVENOUS | Status: AC
  Administered 2019-09-13: 2 g via INTRAVENOUS
  Filled 2019-09-13: qty 50

## 2019-09-13 MED ORDER — AMIODARONE HCL IN DEXTROSE 360-4.14 MG/200ML-% IV SOLN
30.0000 mg/h | INTRAVENOUS | Status: DC
  Administered 2019-09-13 – 2019-09-14 (×2): 30 mg/h via INTRAVENOUS
  Filled 2019-09-13 (×2): qty 200

## 2019-09-13 MED ORDER — AMIODARONE HCL IN DEXTROSE 360-4.14 MG/200ML-% IV SOLN
INTRAVENOUS | Status: AC
  Filled 2019-09-13: qty 200

## 2019-09-13 NOTE — Progress Notes (Signed)
Name: Felicia [name] MRN: [id number] DOB: 24-Jun-1970    ADMISSION DATE:  08/13/2019 CONSULTATION DATE:  09/13/2019   REFERRING MD :  Delphia Grates, triad  CHIEF COMPLAINT: Respiratory distress, worsening hypoxia  BRIEF PATIENT DESCRIPTION: 49 year old with fibrotic NSIP and severe pulmonary hypertension and chronic hypoxic respiratory failure maintained on 10 to 15 L oxygen at home, admitted with worsening hypoxia.  She follows up at [facility] with Dr. Randol Kern and Dr. [name]  SIGNIFICANT EVENTS  7/28 PCCM consult 7/30 palliative care consult. Patient remains very steadfast in hoping for ongoing pulmonary improvement  8/2 slight decrease in O2 support  8/6 Afib with RVR and hypotension, txfr to ICU   MICRO: 7/28 BCx2>> 7/28 UC>>multiple species present, suggest recollection     ANTIBIOTICS: Cefepime 7/27-7/28 Doxycycline 7/27- Vancomycin 7/27-7/28    STUDIES:  Echocardiogram 04/2019 normal LV function, RVSP 64 , enlarged RV, bubble study negative.  PFTs 02/2017 FVC 1.25 L 36% predicted, FEV1 1.18 L 40% predicted with DLCO of 17   HRCT 02/2019 chronic fibrotic ILD progressed from 2018 with mild focus of honeycombing  Cath 3/ 2021right atrial pressure was 7, PA pressure 60/24 with a mean of 39 and a wedge of 10 her cardiac index was low at 1.91 and output of 3.89. Her PVR is thus 7.  HISTORY OF PRESENT ILLNESS: She has a history of ILD/biopsy-proven fibrotic NSIP dating back to 2010.  Serology has been negative in 2011 except for mildly elevated aldolase and she has been maintained on immunosuppressants, CellCept and prednisone, also Rituxan 6 monthly.  Follows with Dr. Randol Kern at [facility].  She has severe pulmonary hypertension, follows with Dr. [name] at [facility] and is maintained on inhaled treprostinil and sildenafil.  Right heart cath from earlier this year as listed above. She reports worsening dyspnea over the past few days, a dry cough and worsening hypoxia for which she sought  attention in the ED.  Reports falls, normally ambulates with a walker.  On further questioning she states that worsening may date back a few weeks Initial evaluation showed mild leukocytosis 13.8, lactate of 5.4, BNP 1982, she required high flow nasal cannula with 20 L oxygen and 100%.  She was given Lasix , diuresed 1 L but then developed hypotension overnight and was given a 500 cc fluid bolus.  Empirically started on antibiotics  Prior [facility] records from Dr. [name] were reviewed , and prior office visit with my partner Dr. Gala Murdoch .  She has been advised weight loss prior to being listed for lung transplant. Prior hospitalization from 05/2019 and discharge summary was reviewed, treated with diuresis and systemic steroids with some improvement  Overnight oon 8/6 pt converted to Afib with RVR and had SBP's in the 70-80's.  Seen by cardiology and given 250cc bolus and Amiodarone 189m.    SUBJECTIVE:  Pt remains awake and alert, but requiring HFNC and HR remains elevated with soft PPB after amiodarone   VITAL SIGNS: Temp:  [97.5 F (36.4 C)-98 F (36.7 C)] 97.6 F (36.4 C) (08/06 0000) Pulse Rate:  [26-151] 54 (08/06 0455) Resp:  [15-24] 19 (08/06 0455) BP: (67-114)/(48-91) 86/72 (08/06 0455) SpO2:  [95 %-100 %] 96 % (08/06 0450) FiO2 (%):  [80 %] 80 % (08/06 0128)  General:  Non-toxic appearing F in no acute distress HEENT: MM pink/moist Neuro: awake, alert, oriented x3 CV: s1s2 irregular and tachycardic, no m/r/g PULM:  Decreased breath sounds bilateral bases GI: soft, bsx4 active  Extremities: warm/dry, no edema  Skin: no  rashes or lesions        Recent Labs  Lab 09/10/19 0330 09/11/19 0526 09/12/19 0537  NA 134* 136 139  K 4.0 3.9 4.3  CL 89* 89* 85*  CO2 36* 36* 40*  BUN 30* 30* 30*  CREATININE 0.60 0.64 0.72  GLUCOSE 148* 167* 155*   Recent Labs  Lab 09/10/19 1522 09/11/19 0631 09/12/19 0537  HGB 12.3 12.2 13.4  HCT 42.3 42.2 46.1*  WBC 18.1* 18.5* 18.7*    PLT 192 204 220   DG Chest Port 1 View  Result Date: 09/11/2019 CLINICAL DATA:  Hypoxia EXAM: PORTABLE CHEST 1 VIEW COMPARISON:  September 08, 2019 FINDINGS: The cardiomediastinal silhouette is unchanged and enlarged in contour.RIGHT upper extremity PICC with tip terminating over the superior cavoatrial junction. No pleural effusion. No pneumothorax. Unchanged coarse reticular opacities consistent with underlying pulmonary fibrosis. No new focal consolidation identified. Visualized abdomen is unremarkable. Multilevel degenerative changes of the thoracic spine. IMPRESSION: Unchanged findings of pulmonary fibrosis. No new focal consolidation identified. Electronically Signed   By: Valentino Saxon MD   On: 09/11/2019 10:17   DG Abd Portable 1V  Result Date: 09/11/2019 CLINICAL DATA:  Abdominal pain EXAM: PORTABLE ABDOMEN - 1 VIEW COMPARISON:  September 05, 2019 FINDINGS: Paucity of bowel gas limits evaluation for obstruction. Incomplete assessment of the RIGHT lateral soft tissues. Air is visualized in the rectum. Moderate colonic stool burden. No dilated loops of bowel are seen. Coarse interstitial opacities at the lung bases consistent with known pulmonary fibrosis. Degenerative changes of the lower lumbar spine. IMPRESSION: 1. Paucity of bowel gas limits evaluation for obstruction. Within the limitations, no evidence of bowel obstruction. 2. Moderate colonic stool burden. Electronically Signed   By: Valentino Saxon MD   On: 09/11/2019 10:19    ASSESSMENT / PLAN:   Atrial Fibrillation with RVR  Getting second amiodarone bolus, BP borderline, no chest pain -Transfer to intensive care for close monitoring -may require peripheral pressor for consistent MAP <65 -Continue Midodrine  -further treatment plan per cardiology -On Eliquis  -hold BB -check TSH -Last echo 7/29 50-55% -Discussed with Cards at the bedside, holding Metoprolol for hypotension  Acute on Chronic Respiratory Failure with baseline  Fibrotic NSIP and severe Pulmonary HTN  Remains on 30L HFNC which is stable from yesterday -continue steroids and nebs for possible ILD flare, treated at [facility], however transfer denied due to lack of beds -Had been on Tyvaso and Sildenafil, Tyvaso stopped -Continue Cellcept and Diamox -On Rubano for pulmonary HTN -On Torsemide   Type 2 DM -Continue Lantus and SSI   CRITICAL CARE Performed by: Otilio Carpen Marzella Miracle   Total critical care time: 50 minutes  Critical care time was exclusive of separately billable procedures and treating other patients.  Critical care was necessary to treat or prevent imminent or life-threatening deterioration.  Critical care was time spent personally by me on the following activities: development of treatment plan with patient and/or surrogate as well as nursing, discussions with consultants, evaluation of patient's response to treatment, examination of patient, obtaining history from patient or surrogate, ordering and performing treatments and interventions, ordering and review of laboratory studies, ordering and review of radiographic studies, pulse oximetry and re-evaluation of patient's condition.     Otilio Carpen Jennica Tagliaferri, PA-C

## 2019-09-13 NOTE — Significant Event (Addendum)
Rapid Response Event Note   Reason for Call : Called d/t AFib with RVR, SBP-70-80s.    Initial Focused Assessment:  Pt laying in bed, lethargic but oriented, c/o chest pressure and a headache.  Pt in no distress. Lungs diminished t/o. Skin warm and dry. HR 120-160(Afib), SBP-84/57, RR-20, SpO2-94% on 30L 80% HHFNC and NRB    Interventions:  250cc NS bolus 182m amiodarone over 30 minutes PCCM consulted-will move to 2M11.   Plan of Care:  Tx to ICU   Event Summary:   MD Notified: Dr. RMarlowe Saxand Dr. TMarletta Lorat bedside on my arrival . Call TGoodmanEnd Time:  HDillard Essex RN

## 2019-09-13 NOTE — Progress Notes (Signed)
Pt tx to 48m1. Report given to 264mN. Pts belongings sent with pt at bedside. Tried to call daughter to give update, but no answer. Will try again. No further complaints.

## 2019-09-13 NOTE — Progress Notes (Signed)
Received call from CMD that pt converted to afib aflutter. HR up to 150s. EKG noted afib RVR. MD notified. MD to speak to cards and await a call back. Pt is resting. No complaints of pain. Will continue to monitor.

## 2019-09-13 NOTE — Progress Notes (Signed)
RT note. Pt. Transferred from 4E14 to 2M11 without any complications. RT will continue to monitor.

## 2019-09-13 NOTE — Progress Notes (Signed)
Patient ID: Felicia [name], female   DOB: 23-Apr-1970, 49 y.o.   MRN: [id number]     Advanced Heart Failure Rounding Note  PCP-Cardiologist: Minus Breeding, MD   Subjective:    She remains on HFNC 30 L.  Atrial fibrillation with RVR last night, now back in NSR with amiodarone boluses.  She was started on norepinephrine 2 with low BP, now stabilized.   CVP 4, co-ox 62%.   Objective:   Weight Range: 86.3 kg Body mass index is 31.66 kg/m.   Vital Signs:   Temp:  [97.5 F (36.4 C)-98 F (36.7 C)] 97.9 F (36.6 C) (08/06 0700) Pulse Rate:  [26-151] 143 (08/06 0700) Resp:  [15-23] 20 (08/06 0700) BP: (67-114)/(43-91) 78/67 (08/06 0700) SpO2:  [93 %-100 %] 93 % (08/06 0700) FiO2 (%):  [70 %-80 %] 70 % (08/06 0632) Weight:  [83.2 kg-86.3 kg] 86.3 kg (08/06 0615) Last BM Date: 09/12/19  Weight change: Filed Weights   09/11/19 0448 09/13/19 0500 09/13/19 0615  Weight: 86.2 kg 83.2 kg 86.3 kg    Intake/Output:   Intake/Output Summary (Last 24 hours) at 09/13/2019 0750 Last data filed at 09/12/2019 2200 Gross per 24 hour  Intake 243 ml  Output 1450 ml  Net -1207 ml      Physical Exam    CVP 4 personally checked.  General: NAD Neck: No JVD, no thyromegaly or thyroid nodule.  Lungs: Distant BS CV: Nondisplaced PMI.  Heart regular S1/S2, no S3/S4, no murmur.  No peripheral edema.   Abdomen: Soft, nontender, no hepatosplenomegaly, no distention.  Skin: Intact without lesions or rashes.  Neurologic: Alert and oriented x 3.  Psych: Normal affect. Extremities: No clubbing or cyanosis.  HEENT: Normal.   Telemetry   NSR 80s with PACs currently, afib/RVR prior. Personally reviewed.   EKG    n/a  Labs    CBC Recent Labs    09/10/19 1522 09/10/19 1522 09/11/19 0631 09/12/19 0537  WBC 18.1*   < > 18.5* 18.7*  NEUTROABS 16.3*  --  16.6*  --   HGB 12.3   < > 12.2 13.4  HCT 42.3   < > 42.2 46.1*  MCV 83.6   < > 84.2 83.8  PLT 192   < > 204 220   < > = values in  this interval not displayed.   Basic Metabolic Panel Recent Labs    09/12/19 0537 09/13/19 0514  NA 139 137  K 4.3 2.9*  CL 85* 84*  CO2 40* 40*  GLUCOSE 155* 124*  BUN 30* 37*  CREATININE 0.72 0.73  CALCIUM 9.4 9.0  MG 2.0 1.7  PHOS 5.1*  --    Liver Function Tests No results for input(s): AST, ALT, ALKPHOS, BILITOT, PROT, ALBUMIN in the last 72 hours. No results for input(s): LIPASE, AMYLASE in the last 72 hours. Cardiac Enzymes No results for input(s): CKTOTAL, CKMB, CKMBINDEX, TROPONINI in the last 72 hours.  BNP: BNP (last 3 results) Recent Labs    05/31/19 0025 08/27/2019 2141 09/09/19 0715  BNP 1,085.4* 1,982.4* 544.6*    ProBNP (last 3 results) No results for input(s): PROBNP in the last 8760 hours.   D-Dimer No results for input(s): DDIMER in the last 72 hours. Hemoglobin A1C No results for input(s): HGBA1C in the last 72 hours. Fasting Lipid Panel No results for input(s): CHOL, HDL, LDLCALC, TRIG, CHOLHDL, LDLDIRECT in the last 72 hours. Thyroid Function Tests Recent Labs    09/13/19 0619  TSH 3.530  Other results:   Imaging    No results found.   Medications:     Scheduled Medications: . apixaban  5 mg Oral BID  . Chlorhexidine Gluconate Cloth  6 each Topical Daily  . digoxin  0.125 mg Oral Daily  . doxycycline  100 mg Oral Q12H  . escitalopram  10 mg Oral Daily  . insulin aspart  0-15 Units Subcutaneous TID WC  . insulin aspart  0-5 Units Subcutaneous QHS  . insulin aspart  3 Units Subcutaneous TID WC  . insulin glargine  35 Units Subcutaneous QHS  . ipratropium-albuterol  3 mL Nebulization TID  . metoprolol succinate  25 mg Oral BID  . midodrine  5 mg Oral TID WC  . multivitamin with minerals  1 tablet Oral Daily  . mycophenolate  1,500 mg Oral BID  . pantoprazole  40 mg Oral Daily  . polyethylene glycol  17 g Oral BID  . potassium chloride  40 mEq Oral Once  . predniSONE  40 mg Oral Q breakfast  . Ensure Max Protein  11  oz Oral BID  . senna-docusate  2 tablet Oral BID  . sildenafil  40 mg Oral TID  . sodium chloride flush  10-40 mL Intracatheter Q12H  . sodium chloride flush  3 mL Intravenous Q12H  . torsemide  60 mg Oral Daily    Infusions: . sodium chloride 10 mL/hr at 09/05/19 0253  . amiodarone    . amiodarone    . magnesium sulfate bolus IVPB 2 g (09/13/19 0731)  . norepinephrine (LEVOPHED) Adult infusion 2 mcg/min (09/13/19 0704)  . potassium chloride      PRN Medications: sodium chloride, acetaminophen, albuterol, guaiFENesin-dextromethorphan, hydrocortisone, morphine injection, simethicone, sodium chloride flush, sodium chloride flush     Assessment/Plan   1. Acute on chronic hypoxemic respiratory failure: Multifactorial. Has ILD/NSIP with possible flare as well as pulmonary hypertension/RV failure. At baseline, on 10-15 L HFNC. She is now on 30 L HFNC.  - Continue treatment of ILD flare per pulmonary, switched from Solumedrol to po prednisone today, continuing mycophenolate. - CVP down to 4-5,  torsemide 60 mg daily now.  2. Pulmonary hypertension: She has been followed at [name], thought to have mixed group 1 and group 3 (ILD/NSIP) PH. She has been on sildenafiland was onTyvasoin the past. INCREASE study would suggest benefit for Tyvaso in patients like her. She does not like Tyvaso because it seems to trigger coughing=> she has stopped it again and refuses to restart. Echo shows D-shaped interventricular septum withseverelydilated and severely dysfunctional RV with estimated PASP 26mHg, moderate-severe TR, LV EF 50-55%, dilated IVC. Bubble study negativein the past.Volume status improved with CVP 5.  Co-ox 62%.   - Continue sildenafil, have increased to 40 mg tid.  -She will not restart Tyvaso. Question efficacy of other selective pulmonary vasodilators given severe ILD and risk for V/Q mismatch (so not sure how helpful IV Flolan would be). -Co-ox adequate now, will  not start milrinone for RV support for the time being.   - Added digoxin 0.125 mg daily for RV.  - Continue midodrine 5 mg tid for soft BP. 3. RV failure:Volume status improved, CVP down to 4-5. She is now on midodrine and NE 2 for low BP with afib/RVR last night.  - Continue torsemide 60 mg daily.  - Continue midodrine.  - Wean NE now that she is back in NSR.  4. Atrial fibrillation with RVR: Last night, has also had MAT.   -  Toprol XL 25 mg bid.  - Continue amiodarone as gtt for now, poor long-term amiodarone candidate.  - Continue Eliquis.  5. H/o DVT in 2020: On Eliquis 5 mg bid. 6. ILD: NSIP. She is on steroids + Cellcept at home, followed at [facility] as outpatient.  7. MAT/wandering atrial pacemaker: Noted on telemetry. Would use Toprol XL rather than atenolol.  CRITICAL CARE Performed by: Loralie Champagne  Total critical care time: 35 minutes  Critical care time was exclusive of separately billable procedures and treating other patients.  Critical care was necessary to treat or prevent imminent or life-threatening deterioration.  Critical care was time spent personally by me on the following activities: development of treatment plan with patient and/or surrogate as well as nursing, discussions with consultants, evaluation of patient's response to treatment, examination of patient, obtaining history from patient or surrogate, ordering and performing treatments and interventions, ordering and review of laboratory studies, ordering and review of radiographic studies, pulse oximetry and re-evaluation of patient's condition.   Loralie Champagne 09/13/2019 7:50 AM

## 2019-09-13 NOTE — Significant Event (Signed)
   Received signout from fellow regarding overnight events, anticipated call from 84M once first bolus of amio had completed. Per nurse, patient remains tachycardic in the 140s. Initial BPs was 08Y systolic so there was order for levophed from medicine team to start. I spoke with Dr. Aundra Dubin who reported options are limited but did recommend another one time bolus of amiodarone 171m - order entered. Nurse aware. When calling back, BP did come up over 90 systolic with adequate MAP. CHF team made aware to prioritize early in rounds. Marvyn Torrez PA-C

## 2019-09-13 NOTE — Progress Notes (Signed)
Pt seen and chart reviewed after already being seen by group early this am.   Appreciate cardiology, no new support indicated at this time. On 30L 70% HFNC at this time.   We will cont to follow.  No new changes at this time.  D/w bedside RN  Critical care time: The patient is critically ill with multiple organ systems failure and requires high complexity decision making for assessment and support, frequent evaluation and titration of therapies, application of advanced monitoring technologies and extensive interpretation of multiple databases.  No charge    [facility] 09/13/2019, 2:47 PM

## 2019-09-13 NOTE — Progress Notes (Signed)
eLink Physician-Brief Progress Note Patient Name: Felicia [name] DOB: January 05, 1971 MRN: [id number]   Date of Service  09/13/2019  HPI/Events of Note  Patient with fibrotic NSIP and acute on chronic respiratory failure, patient developed Afib with RVR  Earlier this morning , with associated hypotension, and was transferred to the ICU for closer monitoring.  eICU Interventions  New Patient Evaluation completed.        Kerry Kass Oziel Beitler 09/13/2019, 6:55 AM

## 2019-09-13 NOTE — Progress Notes (Signed)
Overnight event  Patient went into A. fib with rate up to 160s.  Hypotensive with systolic as low as 89V.  She was seen at bedside along with cardiology.  Complained of dizziness.  She felt her breathing was at her baseline.  Denied chest pain.  She was given a 250 cc fluid bolus but continued to be hypotensive with systolic in the 69I to 50T.  Started on amiodarone bolus and now transferred to the ICU for close monitoring as she will likely need a vasopressor to support her blood pressure.  Critical care is on board, appreciate help.  Appreciate help from cardiology.

## 2019-09-13 NOTE — Progress Notes (Signed)
Physical Therapy Treatment Patient Details Name: Felicia [name] MRN: [id number] DOB: 02-18-1970 Today's Date: 09/13/2019    History of Present Illness Patient is a 49 y/o female who presents with SOB, cough, weakness. 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 PMH includes nonspecific interstitial pneumonitis, DVT, DM, pulmonary HTN, diastolic HF, chronic hypoxic respiratory failure on 12-15L/min 02 [place]    PT Comments    Pt agreeable to EOB.  Emphasis on strengthening exercises LE's, transition to EOB and sitting tolerance at the EOB.  Pt deferred standing and transfer to EOB.  Pt did toss a ball, reaching outside her BOS.    Follow Up Recommendations  Home health PT;Supervision for mobility/OOB;Supervision/Assistance - 24 hour     Equipment Recommendations  None recommended by PT    Recommendations for Other Services       Precautions / Restrictions Precautions Precautions: Fall Precaution Comments: watch 02, on 35L HF, hx of fall this admission, nonrebreather used as needed by pt    Mobility  Bed Mobility Overal bed mobility: Needs Assistance Bed Mobility: Supine to Sit;Sit to Supine     Supine to sit: Mod assist;+2 for physical assistance Sit to supine: Mod assist   General bed mobility comments: pt bridged to EOB with mod, truncal assist up via L elbow with mod of 2.  Pt assisted scoot to EOB with UE's  Transfers                 General transfer comment: deferred  Ambulation/Gait                 Stairs             Wheelchair Mobility    Modified Rankin (Stroke Patients Only)       Balance Overall balance assessment: Needs assistance Sitting-balance support: No upper extremity supported Sitting balance-[name]: Fair (TO GOOD) Sitting balance - Comments: able to leave at EOB for a while,  unable to reach far outside BOS                                    Cognition Arousal/Alertness: Awake/alert Behavior During Therapy: WFL for tasks assessed/performed Overall Cognitive Status: Within Functional Limits for tasks assessed                                        Exercises General Exercises - Lower Extremity Hip Flexion/Marching: AAROM;Strengthening;Both;10 reps;Supine (with graded resistance in gross extension)    General Comments General comments (skin integrity, edema, etc.): sats on 35L HHFNC low 90's      Pertinent Vitals/Pain Pain Assessment: Faces Faces Pain Scale: Hurts little more Pain Location: chest Pain Descriptors / Indicators: Discomfort;Grimacing;Guarding Pain Intervention(s): Monitored during session    Home Living                      Prior Function            PT Goals (current goals can now be found in the care plan section) Acute Rehab PT Goals Patient Stated Goal: to be able to breathe PT Goal Formulation: With patient Time For Goal Achievement: 09/23/19 Potential to Achieve Goals: Fair Progress towards PT goals:  Progressing toward goals    Frequency    Min 3X/week      PT Plan Current plan remains appropriate    Co-evaluation              AM-PAC PT "6 Clicks" Mobility   Outcome Measure  Help needed turning from your back to your side while in a flat bed without using bedrails?: A Little Help needed moving from lying on your back to sitting on the side of a flat bed without using bedrails?: A Lot Help needed moving to and from a bed to a chair (including a wheelchair)?: A Lot Help needed standing up from a chair using your arms (e.g., wheelchair or bedside chair)?: A Lot Help needed to walk in hospital room?: A Lot Help needed climbing 3-5 steps with a railing? : Total 6 Click Score: 12    End of Session Equipment Utilized During Treatment: Oxygen Activity Tolerance: Patient  limited by fatigue;Patient tolerated treatment well Patient left: in bed;with call bell/phone within reach;with bed alarm set Nurse Communication: Mobility status PT Visit Diagnosis: Other abnormalities of gait and mobility (R26.89);Muscle weakness (generalized) (M62.81)     Time: 1605-     Charges:  $Therapeutic Exercise: 8-22 mins $Therapeutic Activity: 8-22 mins                     09/13/2019  Ginger Carne., PT Acute Rehabilitation Services [phone]  (pager) [phone]  (office)   Tessie Fass Lauriann Milillo 09/13/2019, 5:16 PM

## 2019-09-14 DIAGNOSIS — I480 Paroxysmal atrial fibrillation: Secondary | ICD-10-CM

## 2019-09-14 LAB — BASIC METABOLIC PANEL
Anion gap: 12 (ref 5–15)
Anion gap: 12 (ref 5–15)
Anion gap: 12 (ref 5–15)
BUN: 27 mg/dL — ABNORMAL HIGH (ref 6–20)
BUN: 27 mg/dL — ABNORMAL HIGH (ref 6–20)
BUN: 23 mg/dL — ABNORMAL HIGH (ref 6–20)
CO2: 39 mmol/L — ABNORMAL HIGH (ref 22–32)
CO2: 34 mmol/L — ABNORMAL HIGH (ref 22–32)
CO2: 33 mmol/L — ABNORMAL HIGH (ref 22–32)
Calcium: 8.5 mg/dL — ABNORMAL LOW (ref 8.9–10.3)
Calcium: 8.7 mg/dL — ABNORMAL LOW (ref 8.9–10.3)
Calcium: 8.2 mg/dL — ABNORMAL LOW (ref 8.9–10.3)
Chloride: 87 mmol/L — ABNORMAL LOW (ref 98–111)
Chloride: 89 mmol/L — ABNORMAL LOW (ref 98–111)
Chloride: 88 mmol/L — ABNORMAL LOW (ref 98–111)
Creatinine, Ser: 0.69 mg/dL (ref 0.44–1.00)
Creatinine, Ser: 0.76 mg/dL (ref 0.44–1.00)
Creatinine, Ser: 0.75 mg/dL (ref 0.44–1.00)
GFR calc Af Amer: >60 mL/min (ref >60)
GFR calc Af Amer: >60 mL/min (ref >60)
GFR calc Af Amer: >60 mL/min (ref >60)
GFR calc non Af Amer: >60 mL/min (ref >60)
GFR calc non Af Amer: >60 mL/min (ref >60)
GFR calc non Af Amer: >60 mL/min (ref >60)
Glucose, Bld: 124 mg/dL — ABNORMAL HIGH (ref 70–99)
Glucose, Bld: 240 mg/dL — ABNORMAL HIGH (ref 70–99)
Glucose, Bld: 313 mg/dL — ABNORMAL HIGH (ref 70–99)
Potassium: 2.7 mmol/L — CL (ref 3.5–5.1)
Potassium: 4.3 mmol/L (ref 3.5–5.1)
Potassium: 4 mmol/L (ref 3.5–5.1)
Sodium: 138 mmol/L (ref 135–145)
Sodium: 135 mmol/L (ref 135–145)
Sodium: 133 mmol/L — ABNORMAL LOW (ref 135–145)

## 2019-09-14 LAB — GLUCOSE, CAPILLARY
Glucose-Capillary: 58 mg/dL — ABNORMAL LOW (ref 70–99)
Glucose-Capillary: 80 mg/dL (ref 70–99)
Glucose-Capillary: 253 mg/dL — ABNORMAL HIGH (ref 70–99)
Glucose-Capillary: 254 mg/dL — ABNORMAL HIGH (ref 70–99)
Glucose-Capillary: 218 mg/dL — ABNORMAL HIGH (ref 70–99)

## 2019-09-14 LAB — COOXEMETRY PANEL
Carboxyhemoglobin: 1.2 % (ref 0.5–1.5)
Methemoglobin: 0.9 % (ref 0.0–1.5)
O2 Saturation: 45.9 %
Total hemoglobin: 13.3 g/dL (ref 12.0–16.0)

## 2019-09-14 LAB — MAGNESIUM: Magnesium: 2.2 mg/dL (ref 1.7–2.4)

## 2019-09-14 MED ORDER — POTASSIUM CHLORIDE CRYS ER 20 MEQ PO TBCR
40.0000 meq | EXTENDED_RELEASE_TABLET | Freq: Two times a day (BID) | ORAL | Status: DC
  Administered 2019-09-15 – 2019-09-25 (×22): 40 meq via ORAL
  Filled 2019-09-14 (×23): qty 2

## 2019-09-14 MED ORDER — AMIODARONE HCL 200 MG PO TABS
200.0000 mg | ORAL_TABLET | Freq: Two times a day (BID) | ORAL | Status: DC
  Administered 2019-09-14 – 2019-09-21 (×15): 200 mg via ORAL
  Filled 2019-09-14 (×16): qty 1

## 2019-09-14 MED ORDER — POTASSIUM CHLORIDE CRYS ER 20 MEQ PO TBCR
40.0000 meq | EXTENDED_RELEASE_TABLET | Freq: Once | ORAL | Status: AC
  Administered 2019-09-14: 40 meq via ORAL
  Filled 2019-09-14: qty 2

## 2019-09-14 MED ORDER — POTASSIUM CHLORIDE 10 MEQ/50ML IV SOLN
10.0000 meq | INTRAVENOUS | Status: AC
  Administered 2019-09-14 (×4): 10 meq via INTRAVENOUS
  Filled 2019-09-14 (×4): qty 50

## 2019-09-14 NOTE — Progress Notes (Signed)
Patient ID: Sherline Eberwein, female   DOB: [birth year], 49 y.o.   MRN: [id number]     Advanced Heart Failure Rounding Note  PCP-Cardiologist: Minus Breeding, MD   Subjective:    She remains on HFNC 35 L, stable.  She is back in NSR this morning.  BP stable off norepinephrine.   CVP 7-8.   Objective:   Weight Range: 87.2 kg Body mass index is 31.99 kg/m.   Vital Signs:   Temp:  [97.4 F (36.3 C)-97.7 F (36.5 C)] 97.5 F (36.4 C) (08/07 0729) Pulse Rate:  [56-93] 90 (08/07 1023) Resp:  [14-35] 35 (08/07 0900) BP: (75-146)/(55-106) 102/70 (08/07 0900) SpO2:  [87 %-100 %] 99 % (08/07 0900) FiO2 (%):  [70 %-80 %] 70 % (08/07 0800) Weight:  [87.2 kg] 87.2 kg (08/07 0500) Last BM Date: 09/12/19  Weight change: Filed Weights   09/13/19 0500 09/13/19 0615 09/14/19 0500  Weight: 83.2 kg 86.3 kg 87.2 kg    Intake/Output:   Intake/Output Summary (Last 24 hours) at 09/14/2019 1052 Last data filed at 09/14/2019 0900 Gross per 24 hour  Intake 874.74 ml  Output 2500 ml  Net -1625.26 ml      Physical Exam    CVP 7-8  General: NAD Neck: Thick, no JVD, no thyromegaly or thyroid nodule.  Lungs: Decreased bilaterally.  CV: Nondisplaced PMI.  Heart regular S1/S2, no S3/S4, no murmur.  No peripheral edema.   Abdomen: Soft, nontender, no hepatosplenomegaly, no distention.  Skin: Intact without lesions or rashes.  Neurologic: Alert and oriented x 3.  Psych: Normal affect. Extremities: No clubbing or cyanosis.  HEENT: Normal.    Telemetry   NSR 80s. Personally reviewed.   EKG    n/a  Labs    CBC Recent Labs    09/12/19 0537  WBC 18.7*  HGB 13.4  HCT 46.1*  MCV 83.8  PLT 601   Basic Metabolic Panel Recent Labs    09/12/19 0537 09/12/19 0537 09/13/19 0514 09/13/19 0514 09/13/19 2118 09/14/19 0201  NA 139   < > 137   < > 135 138  K 4.3   < > 2.9*   < > 3.1* 2.7*  CL 85*   < > 84*   < > 86* 87*  CO2 40*   < > 40*   < > 36* 39*  GLUCOSE 155*   < > 124*    < > 252* 124*  BUN 30*   < > 37*   < > 31* 27*  CREATININE 0.72   < > 0.73   < > 0.73 0.69  CALCIUM 9.4   < > 9.0   < > 8.3* 8.5*  MG 2.0   < > 1.7  --   --  2.2  PHOS 5.1*  --   --   --   --   --    < > = values in this interval not displayed.   Liver Function Tests No results for input(s): AST, ALT, ALKPHOS, BILITOT, PROT, ALBUMIN in the last 72 hours. No results for input(s): LIPASE, AMYLASE in the last 72 hours. Cardiac Enzymes No results for input(s): CKTOTAL, CKMB, CKMBINDEX, TROPONINI in the last 72 hours.  BNP: BNP (last 3 results) Recent Labs    05/31/19 0025 08/16/2019 2141 09/09/19 0715  BNP 1,085.4* 1,982.4* 544.6*    ProBNP (last 3 results) No results for input(s): PROBNP in the last 8760 hours.   D-Dimer No results for input(s): DDIMER in the last  72 hours. Hemoglobin A1C No results for input(s): HGBA1C in the last 72 hours. Fasting Lipid Panel No results for input(s): CHOL, HDL, LDLCALC, TRIG, CHOLHDL, LDLDIRECT in the last 72 hours. Thyroid Function Tests Recent Labs    09/13/19 0619  TSH 3.530    Other results:   Imaging    No results found.   Medications:     Scheduled Medications: . amiodarone  200 mg Oral BID  . apixaban  5 mg Oral BID  . Chlorhexidine Gluconate Cloth  6 each Topical Daily  . digoxin  0.125 mg Oral Daily  . doxycycline  100 mg Oral Q12H  . escitalopram  10 mg Oral Daily  . insulin aspart  0-15 Units Subcutaneous TID WC  . insulin aspart  0-5 Units Subcutaneous QHS  . insulin aspart  3 Units Subcutaneous TID WC  . insulin glargine  35 Units Subcutaneous QHS  . ipratropium-albuterol  3 mL Nebulization TID  . metoprolol succinate  25 mg Oral BID  . midodrine  5 mg Oral TID WC  . multivitamin with minerals  1 tablet Oral Daily  . mycophenolate  1,500 mg Oral BID  . pantoprazole  40 mg Oral Daily  . polyethylene glycol  17 g Oral BID  . potassium chloride  40 mEq Oral Once  . [START ON 09/15/2019] potassium chloride   40 mEq Oral BID  . predniSONE  40 mg Oral Q breakfast  . Ensure Max Protein  11 oz Oral BID  . senna-docusate  2 tablet Oral BID  . sildenafil  40 mg Oral TID  . sodium chloride flush  10-40 mL Intracatheter Q12H  . sodium chloride flush  3 mL Intravenous Q12H  . torsemide  60 mg Oral Daily    Infusions: . sodium chloride 10 mL/hr at 09/05/19 0253    PRN Medications: sodium chloride, acetaminophen, albuterol, guaiFENesin-dextromethorphan, hydrocortisone, morphine injection, simethicone, sodium chloride flush, sodium chloride flush     Assessment/Plan   1. Acute on chronic hypoxemic respiratory failure: Multifactorial. Has ILD/NSIP with possible flare as well as pulmonary hypertension/RV failure. At baseline, on 10-15 L HFNC. She is now on 35 L HFNC.  - Continue treatment of ILD flare per pulmonary, switched from Solumedrol to po prednisone today, continuing mycophenolate. - CVP 7-8,  torsemide 60 mg daily now.  2. Pulmonary hypertension: She has been followed at [name], thought to have mixed group 1 and group 3 (ILD/NSIP) PH. She has been on sildenafiland was onTyvasoin the past. INCREASE study would suggest benefit for Tyvaso in patients like her. She does not like Tyvaso because it seems to trigger coughing=> she has stopped it again and refuses to restart. Echo shows D-shaped interventricular septum withseverelydilated and severely dysfunctional RV with estimated PASP 48mHg, moderate-severe TR, LV EF 50-55%, dilated IVC. Bubble study negativein the past.Volume status improved with CVP 5.  Co-ox 62%.   - Continue sildenafil, have increased to 40 mg tid.  -She will not restart Tyvaso. Question efficacy of other selective pulmonary vasodilators given severe ILD and risk for V/Q mismatch (so not sure how helpful IV Flolan would be). - Added digoxin 0.125 mg daily for RV.  - Continue midodrine 5 mg tid for soft BP. 3. RV failure:Volume status improved, CVP 7-8.  She is now on midodrine for low BP.  - Continue torsemide 60 mg daily.  - Continue midodrine.  4. Atrial fibrillation with RVR: Paroxysmal, has also had MAT.   - Toprol XL 25 mg bid.  -  Continue amiodarone but switch to po, poor long-term amiodarone candidate. Will try to stop prior to discharge.  - Continue Eliquis.  5. H/o DVT in 2020: On Eliquis 5 mg bid. 6. ILD: NSIP. She is on steroids + Cellcept at home, followed at [facility] as outpatient.  7. MAT/wandering atrial pacemaker: Noted on telemetry. Would use Toprol XL rather than atenolol.   Loralie Champagne 09/14/2019 10:52 AM

## 2019-09-14 NOTE — Progress Notes (Addendum)
Name: Felicia [name] MRN: [id number] DOB: 01/02/1971    ADMISSION DATE:  08/08/2019 CONSULTATION DATE:  09/14/2019 REFERRING MD :  Delphia Grates, triad  CHIEF COMPLAINT: Respiratory distress, worsening hypoxia  BRIEF PATIENT DESCRIPTION: 49 year old with fibrotic NSIP and severe pulmonary hypertension and chronic hypoxic respiratory failure maintained on 10 to 15 L oxygen at home, admitted with worsening hypoxia.  She follows up at [place] with Dr. Randol Kern and Dr. [name]  SIGNIFICANT EVENTS  7/28 PCCM consult 7/30 palliative care consult. Patient remains very steadfast in hoping for ongoing pulmonary improvement  8/2 slight decrease in O2 support  8/6 Afib with RVR and hypotension, txfr to ICU  MICRO: 7/28 BCx2>> 7/28 UC>>multiple species present, suggest recollection  ANTIBIOTICS: Cefepime 7/27-7/28 Doxycycline 7/27- Vancomycin 7/27-7/28  STUDIES:  Echocardiogram 04/2019 normal LV function, RVSP 64 , enlarged RV, bubble study negative.  PFTs 02/2017 FVC 1.25 L 36% predicted, FEV1 1.18 L 40% predicted with DLCO of 17   HRCT 02/2019 chronic fibrotic ILD progressed from 2018 with mild focus of honeycombing  Cath 3/ 2021right atrial pressure was 7, PA pressure 60/24 with a mean of 39 and a wedge of 10 her cardiac index was low at 1.91 and output of 3.89. Her PVR is thus 7.  HISTORY OF PRESENT ILLNESS: She has a history of ILD/biopsy-proven fibrotic NSIP dating back to 2010.  Serology has been negative in 2011 except for mildly elevated aldolase and she has been maintained on immunosuppressants, CellCept and prednisone, also Rituxan 6 monthly.  Follows with Dr. Randol Kern at [facility].  She has severe pulmonary hypertension, follows with Dr. [name] at [facility] and is maintained on inhaled treprostinil and sildenafil.  Right heart cath from earlier this year as listed above. She reports worsening dyspnea over the past few days, a dry cough and worsening hypoxia for which she sought attention in the ED.   Reports falls, normally ambulates with a walker.  On further questioning she states that worsening may date back a few weeks Initial evaluation showed mild leukocytosis 13.8, lactate of 5.4, BNP 1982, she required high flow nasal cannula with 20 L oxygen and 100%.  She was given Lasix , diuresed 1 L but then developed hypotension overnight and was given a 500 cc fluid bolus.  Empirically started on antibiotics  Prior [facility] records from Dr. [name] were reviewed , and prior office visit with my partner Dr. Gala Murdoch .  She has been advised weight loss prior to being listed for lung transplant. Prior hospitalization from 05/2019 and discharge summary was reviewed, treated with diuresis and systemic steroids with some improvement  Overnight oon 8/6 pt converted to Afib with RVR and had SBP's in the 70-80's.  Seen by cardiology and given 250cc bolus and Amiodarone 129m.    SUBJECTIVE:   she thinks everything is going better this morning. Still on HHFNC. Looks sinus on tele. Remains on amio infusion.   VITAL SIGNS: Temp:  [97.4 F (36.3 C)-97.7 F (36.5 C)] 97.5 F (36.4 C) (08/07 0729) Pulse Rate:  [56-81] 81 (08/07 0700) Resp:  [14-30] 24 (08/07 0700) BP: (75-125)/(55-92) 106/81 (08/07 0700) SpO2:  [87 %-100 %] 97 % (08/07 0800) FiO2 (%):  [70 %-80 %] 70 % (08/07 0800) Weight:  [87.2 kg] 87.2 kg (08/07 0500)  General appearance: 49y.o., female, NAD, conversant  Eyes: anicteric sclerae, moist conjunctivae;  HENT: NCAT; oropharynx, MMM Neck: Trachea midline; Lungs: BL crackles  CV: RRR, S1, S2, no MRGs  Abdomen: Soft, non-tender; non-distended, BS present  Extremities: No peripheral edema, radial and DP pulses present bilaterally  Psych: Appropriate affect Neuro: Alert and oriented to person and place, no focal deficit     Recent Labs  Lab 09/13/19 0514 09/13/19 2118 09/14/19 0201  NA 137 135 138  K 2.9* 3.1* 2.7*  CL 84* 86* 87*  CO2 40* 36* 39*  BUN 37* 31* 27*  CREATININE 0.73  0.73 0.69  GLUCOSE 124* 252* 124*   Recent Labs  Lab 09/10/19 1522 09/11/19 0631 09/12/19 0537  HGB 12.3 12.2 13.4  HCT 42.3 42.2 46.1*  WBC 18.1* 18.5* 18.7*  PLT 192 204 220   No results found.   ASSESSMENT / PLAN:  Atrial Fibrillation with RVR  Hypotension, resolved  - continue amiodarone  - with her lung disease I would like get her off and prefer not long term use  - continue eliquis  - rate now controlled  - continue metoprolol and dig   Acute on Chronic Respiratory Failure with baseline Fibrotic NSIP and severe Pulmonary HTN  Acute exacerbation of ILD, slow to resolve  Plans: Weaning fio2 This will be a prolonged process  Continue steroids and mycophenolate  Continue sildenafil  tyvasso stopped, as an outpatient  Diuresis with torsemide Discussed GOC briefly this morning, patient high risk for intubation if she decompensates. She remains a full code and would do very poorly on mechanical support.    Hypokalemia -replete - check bmp this afternoon   Type 2 DM CBGs SSI, lantus   Anxiety  Continue lexapro   This patient is critically ill with multiple organ system failure; which, requires frequent high complexity decision making, assessment, support, evaluation, and titration of therapies. This was completed through the application of advanced monitoring technologies and extensive interpretation of multiple databases. During this encounter critical care time was devoted to patient care services described in this note for 31 minutes.  Garner Nash, DO [facility] 09/14/2019 9:22 AM

## 2019-09-14 NOTE — Progress Notes (Signed)
CRITICAL VALUE ALERT  Critical Value:  K+ 2.7  Date & Time Notied:  09/14/2019 0305  Provider Notified: Margaree Mackintosh notified, MD aware  Orders Received/Actions taken: Awaiting orders. Will continue to monitor closely.    Antonieta Pert, RN  09/14/2019 [phone]

## 2019-09-14 NOTE — Progress Notes (Signed)
CRITICAL VALUE ALERT  Critical Value:CBG 58  Date & Time Notied:  09-14-19 0800  Provider Notified: Icard  Orders Received/Actions taken: Pt has juice and ate breakfast. CBG on recheck is 80. No new orders

## 2019-09-14 NOTE — Progress Notes (Signed)
K 2.7 Electrolytes replaced per protocol

## 2019-09-15 LAB — BASIC METABOLIC PANEL
Anion gap: 14 (ref 5–15)
BUN: 22 mg/dL — ABNORMAL HIGH (ref 6–20)
CO2: 32 mmol/L (ref 22–32)
Calcium: 8.8 mg/dL — ABNORMAL LOW (ref 8.9–10.3)
Chloride: 91 mmol/L — ABNORMAL LOW (ref 98–111)
Creatinine, Ser: 0.66 mg/dL (ref 0.44–1.00)
GFR calc Af Amer: >60 mL/min (ref >60)
GFR calc non Af Amer: >60 mL/min (ref >60)
Glucose, Bld: 61 mg/dL — ABNORMAL LOW (ref 70–99)
Potassium: 3.3 mmol/L — ABNORMAL LOW (ref 3.5–5.1)
Sodium: 137 mmol/L (ref 135–145)

## 2019-09-15 LAB — GLUCOSE, CAPILLARY
Glucose-Capillary: 63 mg/dL — ABNORMAL LOW (ref 70–99)
Glucose-Capillary: 59 mg/dL — ABNORMAL LOW (ref 70–99)
Glucose-Capillary: 113 mg/dL — ABNORMAL HIGH (ref 70–99)
Glucose-Capillary: 234 mg/dL — ABNORMAL HIGH (ref 70–99)
Glucose-Capillary: 264 mg/dL — ABNORMAL HIGH (ref 70–99)

## 2019-09-15 MED ORDER — INSULIN GLARGINE 100 UNIT/ML ~~LOC~~ SOLN
25.0000 [IU] | Freq: Every day | SUBCUTANEOUS | Status: DC
  Administered 2019-09-15: 25 [IU] via SUBCUTANEOUS
  Filled 2019-09-15 (×2): qty 0.25

## 2019-09-15 MED ORDER — NYSTATIN 100000 UNIT/ML MT SUSP
5.0000 mL | Freq: Four times a day (QID) | OROMUCOSAL | Status: AC
  Administered 2019-09-15 – 2019-09-17 (×8): 500000 [IU] via ORAL
  Filled 2019-09-15 (×6): qty 5

## 2019-09-15 NOTE — Progress Notes (Signed)
Name: Felicia [name] MRN: [id number] DOB: [birth year]    ADMISSION DATE:  09/01/2019 CONSULTATION DATE:  09/15/2019 REFERRING MD :  Delphia Grates, triad  CHIEF COMPLAINT: Respiratory distress, worsening hypoxia  BRIEF PATIENT DESCRIPTION: 49 year old with fibrotic NSIP and severe pulmonary hypertension and chronic hypoxic respiratory failure maintained on 10 to 15 L oxygen at home, admitted with worsening hypoxia.  She follows up at [place] with Dr. Randol Kern and Dr. [name]  SIGNIFICANT EVENTS  7/28 PCCM consult 7/30 palliative care consult. Patient remains very steadfast in hoping for ongoing pulmonary improvement  8/2 slight decrease in O2 support  8/6 Afib with RVR and hypotension, txfr to ICU  MICRO: 7/28 BCx2>> 7/28 UC>>multiple species present, suggest recollection  ANTIBIOTICS: Cefepime 7/27-7/28 Doxycycline 7/27- Vancomycin 7/27-7/28  STUDIES:  Echocardiogram 04/2019 normal LV function, RVSP 64 , enlarged RV, bubble study negative.  PFTs 02/2017 FVC 1.25 L 36% predicted, FEV1 1.18 L 40% predicted with DLCO of 17   HRCT 02/2019 chronic fibrotic ILD progressed from 2018 with mild focus of honeycombing  Cath 3/ 2021right atrial pressure was 7, PA pressure 60/24 with a mean of 39 and a wedge of 10 her cardiac index was low at 1.91 and output of 3.89. Her PVR is thus 7.  HISTORY OF PRESENT ILLNESS: She has a history of ILD/biopsy-proven fibrotic NSIP dating back to 2010.  Serology has been negative in 2011 except for mildly elevated aldolase and she has been maintained on immunosuppressants, CellCept and prednisone, also Rituxan 6 monthly.  Follows with Dr. Randol Kern at [facility].  She has severe pulmonary hypertension, follows with Dr. [name] at [facility] and is maintained on inhaled treprostinil and sildenafil.  Right heart cath from earlier this year as listed above. She reports worsening dyspnea over the past few days, a dry cough and worsening hypoxia for which she sought attention in the ED.   Reports falls, normally ambulates with a walker.  On further questioning she states that worsening may date back a few weeks Initial evaluation showed mild leukocytosis 13.8, lactate of 5.4, BNP 1982, she required high flow nasal cannula with 20 L oxygen and 100%.  She was given Lasix , diuresed 1 L but then developed hypotension overnight and was given a 500 cc fluid bolus.  Empirically started on antibiotics  Prior [facility] records from Dr. [name] were reviewed , and prior office visit with my partner Dr. Gala Murdoch .  She has been advised weight loss prior to being listed for lung transplant. Prior hospitalization from 05/2019 and discharge summary was reviewed, treated with diuresis and systemic steroids with some improvement  Overnight oon 8/6 pt converted to Afib with RVR and had SBP's in the 70-80's.  Seen by cardiology and given 250cc bolus and Amiodarone 130m.    SUBJECTIVE:  She feels ok. She seems a little more frustrated this morning.   VITAL SIGNS: Temp:  [97.6 F (36.4 C)-98.9 F (37.2 C)] 97.7 F (36.5 C) (08/08 0700) Pulse Rate:  [37-109] 100 (08/08 0800) Resp:  [18-42] 31 (08/08 0800) BP: (53-197)/(12-171) 100/79 (08/08 0800) SpO2:  [44 %-100 %] 96 % (08/08 0802) FiO2 (%):  [60 %] 60 % (08/08 0802) Weight:  [83.8 kg] 83.8 kg (08/08 0500)  General appearance: 49y.o., female, NAD, conversant  Eyes: anicteric sclerae, moist conjunctivae;  HENT: NCAT; oropharynx, MMM Neck: Trachea midline; Lungs: BL crackles  CV: RRR, S1, S2, no MRGs  Abdomen: Soft, non-tender; non-distended, BS present  Extremities: No peripheral edema, radial and DP pulses present bilaterally  Psych: Appropriate affect Neuro: Alert and oriented to person and place, no focal deficit     Recent Labs  Lab 09/14/19 1028 09/14/19 2006 09/15/19 0309  NA 135 133* 137  K 4.3 4.0 3.3*  CL 89* 88* 91*  CO2 34* 33* 32  BUN 27* 23* 22*  CREATININE 0.76 0.75 0.66  GLUCOSE 240* 313* 61*   Recent Labs  Lab  09/10/19 1522 09/11/19 0631 09/12/19 0537  HGB 12.3 12.2 13.4  HCT 42.3 42.2 46.1*  WBC 18.1* 18.5* 18.7*  PLT 192 204 220   No results found.   ASSESSMENT / PLAN:  Atrial Fibrillation with RVR  Hypotension, resolved  - swapped to oral amio  - continue metoprolol and dig  - telemetry  - eliquis   Acute on Chronic Respiratory Failure with baseline Fibrotic NSIP and severe Pulmonary HTN  Acute exacerbation of ILD, slow to resolve  Plans: Weaning fio2 as tolerated We are seemingly stuck right now around 60-70% fio2 Difficult to wean  Remains on steroids and cellcept  Continuing sildenafil  Diuresis with torsemide  Again discussed GOC with patient this am Would continue palliative care discussions  If stable throughout day could consider transfer out of ICU to progressive tomorrow  I still think she is high risk for decompensation She needs to work with PT OT   Hypokalemia - replete as needed - follow bmp   Type 2 DM cbgs ssi  Decreased night lantus due to low am glucose   Anxiety  Continue lexapro   This patient is critically ill with multiple organ system failure; which, requires frequent high complexity decision making, assessment, support, evaluation, and titration of therapies. This was completed through the application of advanced monitoring technologies and extensive interpretation of multiple databases. During this encounter critical care time was devoted to patient care services described in this note for 32 minutes.   [facility] 09/15/2019 8:30 AM

## 2019-09-15 NOTE — Progress Notes (Signed)
eLink Physician-Brief Progress Note Patient Name: Felicia [name] DOB: Sep 13, 1970 MRN: [id number]   Date of Service  09/15/2019  HPI/Events of Note  Oral thrush with lung issues/resp failure, ILD, CHF.  eICU Interventions  - Nystatin orally ordered.      Intervention Category Intermediate Interventions: Other:  Elmer Sow 09/15/2019, 8:30 PM

## 2019-09-16 DIAGNOSIS — J849 Interstitial pulmonary disease, unspecified: Secondary | ICD-10-CM | POA: Diagnosis not present

## 2019-09-16 DIAGNOSIS — K5900 Constipation, unspecified: Secondary | ICD-10-CM

## 2019-09-16 DIAGNOSIS — Z515 Encounter for palliative care: Secondary | ICD-10-CM | POA: Diagnosis not present

## 2019-09-16 LAB — BASIC METABOLIC PANEL
Anion gap: 11 (ref 5–15)
BUN: 21 mg/dL — ABNORMAL HIGH (ref 6–20)
CO2: 31 mmol/L (ref 22–32)
Calcium: 8.8 mg/dL — ABNORMAL LOW (ref 8.9–10.3)
Chloride: 97 mmol/L — ABNORMAL LOW (ref 98–111)
Creatinine, Ser: 0.71 mg/dL (ref 0.44–1.00)
GFR calc Af Amer: >60 mL/min (ref >60)
GFR calc non Af Amer: >60 mL/min (ref >60)
Glucose, Bld: 75 mg/dL (ref 70–99)
Potassium: 3.7 mmol/L (ref 3.5–5.1)
Sodium: 139 mmol/L (ref 135–145)

## 2019-09-16 LAB — COOXEMETRY PANEL
Carboxyhemoglobin: 1 % (ref 0.5–1.5)
Methemoglobin: 0.6 % (ref 0.0–1.5)
O2 Saturation: 77.9 %
Total hemoglobin: 12.9 g/dL (ref 12.0–16.0)

## 2019-09-16 LAB — GLUCOSE, CAPILLARY
Glucose-Capillary: 49 mg/dL — ABNORMAL LOW (ref 70–99)
Glucose-Capillary: 75 mg/dL (ref 70–99)
Glucose-Capillary: 75 mg/dL (ref 70–99)
Glucose-Capillary: 385 mg/dL — ABNORMAL HIGH (ref 70–99)
Glucose-Capillary: 228 mg/dL — ABNORMAL HIGH (ref 70–99)

## 2019-09-16 LAB — DIGOXIN LEVEL: Digoxin Level: 0.4 ng/mL — ABNORMAL LOW (ref 0.8–2.0)

## 2019-09-16 MED ORDER — INSULIN ASPART 100 UNIT/ML ~~LOC~~ SOLN
5.0000 [IU] | Freq: Three times a day (TID) | SUBCUTANEOUS | Status: DC
  Administered 2019-09-16: 5 [IU] via SUBCUTANEOUS

## 2019-09-16 MED ORDER — VITAMIN D 25 MCG (1000 UNIT) PO TABS
1000.0000 [IU] | ORAL_TABLET | Freq: Every day | ORAL | Status: DC
  Administered 2019-09-16 – 2019-09-29 (×14): 1000 [IU] via ORAL
  Filled 2019-09-16 (×14): qty 1

## 2019-09-16 MED ORDER — INSULIN GLARGINE 100 UNIT/ML ~~LOC~~ SOLN
15.0000 [IU] | Freq: Every day | SUBCUTANEOUS | Status: DC
  Administered 2019-09-16: 15 [IU] via SUBCUTANEOUS
  Filled 2019-09-16 (×2): qty 0.15

## 2019-09-16 MED ORDER — CALCIUM CITRATE 950 (200 CA) MG PO TABS
200.0000 mg | ORAL_TABLET | Freq: Every day | ORAL | Status: DC
  Administered 2019-09-16 – 2019-09-29 (×14): 200 mg via ORAL
  Filled 2019-09-16 (×14): qty 1

## 2019-09-16 MED ORDER — ONDANSETRON HCL 4 MG/2ML IJ SOLN
4.0000 mg | Freq: Four times a day (QID) | INTRAMUSCULAR | Status: DC | PRN
  Administered 2019-09-19: 4 mg via INTRAVENOUS
  Filled 2019-09-16: qty 2

## 2019-09-16 MED ORDER — ORAL CARE MOUTH RINSE
15.0000 mL | Freq: Two times a day (BID) | OROMUCOSAL | Status: DC
  Administered 2019-09-16 – 2019-09-29 (×25): 15 mL via OROMUCOSAL

## 2019-09-16 MED ORDER — DEXTROSE-NACL 5-0.9 % IV SOLN: INTRAVENOUS | Status: DC

## 2019-09-16 MED ORDER — SULFAMETHOXAZOLE-TRIMETHOPRIM 800-160 MG PO TABS
1.0000 | ORAL_TABLET | ORAL | Status: DC
  Administered 2019-09-16 – 2019-09-27 (×6): 1 via ORAL
  Filled 2019-09-16 (×6): qty 1

## 2019-09-16 NOTE — Progress Notes (Addendum)
Patient ID: Felicia [name], female   DOB: 1970/05/04, 49 y.o.   MRN: [id number]     Advanced Heart Failure Rounding Note  PCP-Cardiologist: Minus Breeding, MD   Subjective:    She remains on HFNC. Still in NSR, HR 90s.   CVP 8. Wt down 1 lb.   Sitting up in bed on her laptop. Feels a tad bit better today. Just tired from her PT session today.   Objective:   Weight Range: 83.2 kg Body mass index is 30.52 kg/m.   Vital Signs:   Temp:  [97.5 F (36.4 C)-98.9 F (37.2 C)] 97.6 F (36.4 C) (08/09 1529) Pulse Rate:  [72-118] 72 (08/09 1500) Resp:  [14-40] 34 (08/09 1500) BP: (96-123)/(59-98) 104/81 (08/09 1300) SpO2:  [84 %-100 %] 100 % (08/09 1500) FiO2 (%):  [70 %] 70 % (08/09 0833) Weight:  [83.2 kg] 83.2 kg (08/09 0411) Last BM Date: 09/15/19  Weight change: Filed Weights   09/14/19 0500 09/15/19 0500 09/16/19 0411  Weight: 84.2 kg 83.8 kg 83.2 kg    Intake/Output:   Intake/Output Summary (Last 24 hours) at 09/16/2019 1613 Last data filed at 09/16/2019 1400 Gross per 24 hour  Intake 420 ml  Output 1058 ml  Net -638 ml      Physical Exam    CVP 8 General: chronically ill appearing AAF on HFNC Neck: Thick neck, JVD not well visualized, no thyromegaly or thyroid nodule.  Lungs: Decreased BS bilaterally. No wheezing  CV: Nondisplaced PMI.  Heart regular S1/S2, no S3/S4, no murmur.  No peripheral edema.   Abdomen: Soft, nontender, no hepatosplenomegaly, no distention.  Skin: Intact without lesions or rashes.  Neurologic: Alert and oriented x 3.  Psych: Normal affect. Extremities: No clubbing or cyanosis.  HEENT: + Exophthalmos, otherwise normal.    Telemetry   NSR 90s Personally reviewed.   EKG    n/a  Labs    CBC No results for input(s): WBC, NEUTROABS, HGB, HCT, MCV, PLT in the last 72 hours. Basic Metabolic Panel Recent Labs    09/14/19 0201 09/14/19 1028 09/15/19 0309 09/16/19 0410  NA 138   < > 137 139  K 2.7*   < > 3.3* 3.7  CL 87*    < > 91* 97*  CO2 39*   < > 32 31  GLUCOSE 124*   < > 61* 75  BUN 27*   < > 22* 21*  CREATININE 0.69   < > 0.66 0.71  CALCIUM 8.5*   < > 8.8* 8.8*  MG 2.2  --   --   --    < > = values in this interval not displayed.   Liver Function Tests No results for input(s): AST, ALT, ALKPHOS, BILITOT, PROT, ALBUMIN in the last 72 hours. No results for input(s): LIPASE, AMYLASE in the last 72 hours. Cardiac Enzymes No results for input(s): CKTOTAL, CKMB, CKMBINDEX, TROPONINI in the last 72 hours.  BNP: BNP (last 3 results) Recent Labs    05/31/19 0025 09/07/2019 2141 09/09/19 0715  BNP 1,085.4* 1,982.4* 544.6*    ProBNP (last 3 results) No results for input(s): PROBNP in the last 8760 hours.   D-Dimer No results for input(s): DDIMER in the last 72 hours. Hemoglobin A1C No results for input(s): HGBA1C in the last 72 hours. Fasting Lipid Panel No results for input(s): CHOL, HDL, LDLCALC, TRIG, CHOLHDL, LDLDIRECT in the last 72 hours. Thyroid Function Tests No results for input(s): TSH, T4TOTAL, T3FREE, THYROIDAB in the last 72  hours.  Invalid input(s): FREET3  Other results:   Imaging    No results found.   Medications:     Scheduled Medications: . amiodarone  200 mg Oral BID  . apixaban  5 mg Oral BID  . calcium citrate  200 mg of elemental calcium Oral Daily  . Chlorhexidine Gluconate Cloth  6 each Topical Daily  . cholecalciferol  1,000 Units Oral Daily  . digoxin  0.125 mg Oral Daily  . escitalopram  10 mg Oral Daily  . insulin aspart  0-15 Units Subcutaneous TID WC  . insulin aspart  0-5 Units Subcutaneous QHS  . insulin aspart  5 Units Subcutaneous TID WC  . insulin glargine  15 Units Subcutaneous QHS  . ipratropium-albuterol  3 mL Nebulization TID  . mouth rinse  15 mL Mouth Rinse BID  . metoprolol succinate  25 mg Oral BID  . midodrine  5 mg Oral TID WC  . multivitamin with minerals  1 tablet Oral Daily  . mycophenolate  1,500 mg Oral BID  . nystatin  5  mL Oral QID  . pantoprazole  40 mg Oral Daily  . polyethylene glycol  17 g Oral BID  . potassium chloride  40 mEq Oral BID  . predniSONE  40 mg Oral Q breakfast  . Ensure Max Protein  11 oz Oral BID  . senna-docusate  2 tablet Oral BID  . sildenafil  40 mg Oral TID  . sodium chloride flush  10-40 mL Intracatheter Q12H  . sodium chloride flush  3 mL Intravenous Q12H  . sulfamethoxazole-trimethoprim  1 tablet Oral Once per day on Mon Wed Fri  . torsemide  60 mg Oral Daily    Infusions: . sodium chloride 10 mL/hr at 09/05/19 0253  . dextrose 5 % and 0.9% NaCl      PRN Medications: sodium chloride, acetaminophen, albuterol, guaiFENesin-dextromethorphan, hydrocortisone, morphine injection, ondansetron, simethicone, sodium chloride flush, sodium chloride flush     Assessment/Plan   1. Acute on chronic hypoxemic respiratory failure: Multifactorial. Has ILD/NSIP with possible flare as well as pulmonary hypertension/RV failure. At baseline, on 10-15 L HFNC. She is now on 35 L HFNC.  - Continue treatment of ILD flare per pulmonary, switched from Solumedrol to  Prednisone, currently 40 mg daily , continuing mycophenolate. - CVP 8,  Continue torsemide 60 mg daily for now.  2. 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 She has been on sildenafiland was onTyvasoin the past. INCREASE study would suggest benefit for Tyvaso in patients like her. She does not like Tyvaso because it seems to trigger coughing=> she has stopped it again and refuses to restart. Echo shows D-shaped interventricular septum withseverelydilated and severely dysfunctional RV with estimated PASP 65mHg, moderate-severe TR, LV EF 50-55%, dilated IVC. Bubble study negativein the past.Volume status improved with CVP 8.  Will check repeat co-ox (low yesterday at 46%)  - Continue sildenafil, have increased to 40 mg tid.  -She will not restart  Tyvaso. Question efficacy of other selective pulmonary vasodilators given severe ILD and risk for V/Q mismatch (so not sure how helpful IV Flolan would be). - Added digoxin 0.125 mg daily for RV.  - Continue midodrine 5 mg tid for soft BP. 3. RV failure:Volume status improved, CVP ~8. She is now on midodrine for low BP.  - Continue torsemide 60 mg daily.  - Continue midodrine.  4. Atrial fibrillation with RVR: Paroxysmal, has  also had MAT.   - currently NSR, HR 90s  - Toprol XL 25 mg bid.  - Continue po amio 200 mg bid for now, poor long-term amiodarone candidate. Will try to stop prior to discharge.  - Continue Eliquis.  5. H/o DVT in 2020: On Eliquis 5 mg bid. 6. ILD: NSIP. She is on steroids + Cellcept at home, followed at [facility] as outpatient.  7. MAT/wandering atrial pacemaker: Noted on telemetry. Would use Toprol XL rather than atenolol. - currently NSR. Continue to monitor    Lyda Jester, PA-C  09/16/2019 4:13 PM  Agree with the above note.   Stable from cardiac standpoint.  Remains in NSR.  CVP 8 on torsemide 60 mg daily.  - Continue amiodarone 200 mg bid for now, would not continue long-term (stop at discharge).  - Continue torsemide 60 mg daily, BMET stable.   Loralie Champagne 09/16/2019

## 2019-09-16 NOTE — Progress Notes (Signed)
Physical Therapy Treatment Patient Details Name: Felicia [name] MRN: [id number] DOB: August 18, 1970 Today's Date: 09/16/2019    History of Present Illness Patient is a 49 y/o female who presents with SOB, cough, weakness. 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 PMH includes nonspecific interstitial pneumonitis, DVT, DM, pulmonary HTN, diastolic HF, chronic hypoxic respiratory failure on 12-15L/min 02 [place]    PT Comments    Pt wanted to participate more today, but was unable to keep her saturations up to try getting OOB.  Emphasis on transitions to EOB, sitting tolerance.  Balance tossing a ball at EOB.  All limited by lack of adequate oxygen.     Follow Up Recommendations  Home health PT;Supervision for mobility/OOB;Supervision/Assistance - 24 hour     Equipment Recommendations  None recommended by PT    Recommendations for Other Services       Precautions / Restrictions Precautions Precautions: Fall Precaution Comments: watch 02, on 35L HF, hx of fall this admission, nonrebreather used as needed by pt Restrictions Weight Bearing Restrictions: No    Mobility  Bed Mobility Overal bed mobility: Needs Assistance Bed Mobility: Supine to Sit;Sit to Supine     Supine to sit: Mod assist;+2 for safety/equipment Sit to supine: Mod assist;+2 for safety/equipment   General bed mobility comments: assist for full trunk elevation and to scoot hips towards EOB, requires rest breaks with transitions, assist with LEs onto bed. +2 to boost to [place] transfer comment: deferred, pt declined attempts due to fatigue and pt also with decreased O2 sats with seated activity today  Ambulation/Gait                 Stairs             Wheelchair Mobility    Modified Rankin (Stroke Patients Only)        Balance Overall balance assessment: Needs assistance Sitting-balance support: No upper extremity supported Sitting balance-[name]: Fair Sitting balance - Comments: supervision EOB with pt tending to rely on at least single UE support for added stability, able to maintain balance with no UE support; unable to reach far outside BOS when tossing and catching a ball                                    Cognition Arousal/Alertness: Awake/alert Behavior During Therapy: WFL for tasks assessed/performed Overall Cognitive Status: Within Functional Limits for tasks assessed                                 General Comments: tearful end of session       Exercises General Exercises - Lower Extremity Hip Flexion/Marching: AAROM;Strengthening;Both;10 reps;Supine (with graded resistance in gross extension) Other Exercises Other Exercises: seated ball toss    General Comments General comments (skin integrity, edema, etc.): SIn sitting sats remained in the 70's to low 80's on 45L HHFNC until Nonrebreather added and sats rebounded well.      Pertinent Vitals/Pain Pain Assessment: Faces Faces Pain Scale: Hurts little more Pain Location: generalized  Pain Descriptors / Indicators: Discomfort;Grimacing;Guarding Pain Intervention(s): Monitored during session    Home Living  Prior Function            PT Goals (current goals can now be found in the care plan section) Acute Rehab PT Goals Patient Stated Goal: to be able to breathe PT Goal Formulation: With patient Time For Goal Achievement: 09/23/19 Potential to Achieve Goals: Fair Progress towards PT goals: Progressing toward goals    Frequency    Min 3X/week      PT Plan Current plan remains appropriate    Co-evaluation PT/OT/SLP Co-Evaluation/Treatment: Yes Reason for Co-Treatment: Complexity of the patient's impairments (multi-system involvement) PT goals  addressed during session: Mobility/safety with mobility OT goals addressed during session: ADL's and self-care      AM-PAC PT "6 Clicks" Mobility   Outcome Measure  Help needed turning from your back to your side while in a flat bed without using bedrails?: A Little Help needed moving from lying on your back to sitting on the side of a flat bed without using bedrails?: A Lot Help needed moving to and from a bed to a chair (including a wheelchair)?: A Lot Help needed standing up from a chair using your arms (e.g., wheelchair or bedside chair)?: A Lot Help needed to walk in hospital room?: A Lot Help needed climbing 3-5 steps with a railing? : Total 6 Click Score: 12    End of Session Equipment Utilized During Treatment: Oxygen Activity Tolerance: Patient limited by fatigue;Other (comment) (O2 saturation limited) Patient left: in bed;with call bell/phone within reach;with bed alarm set Nurse Communication: Mobility status PT Visit Diagnosis: Other abnormalities of gait and mobility (R26.89);Muscle weakness (generalized) (M62.81)     Time: 7116-5790 PT Time Calculation (min) (ACUTE ONLY): 49 min  Charges:  $Therapeutic Activity: 8-22 mins                     09/16/2019  Ginger Carne., PT Acute Rehabilitation Services [phone]  (pager) [phone]  (office)   Tessie Fass Halena Mohar 09/16/2019, 5:44 PM

## 2019-09-16 NOTE — Progress Notes (Signed)
Patient ID: Felicia [name], female   DOB: [birth year], 49 y.o.   MRN: [id number]  This NP reviewed medical records, received report from team and then visited patient at the bedside as a follow up for palliative medicine needs and emotional support.   Patient remains on high flow oxygen, currently in the ICU, she does desaturate on minimal exertion.  HF team consulted, PICC line placed for monitoring coox and possible milrinone  She is high risk for decompensation secondary to her multiple comorbidities.  Patient is pleasant and engaged in conversation regarding her treatment plan. Patient remains open to all offered and available medical interventions to prolong life.  She remains hopeful for transition to [facility] when bed is available.    Patient is open to all offered and available medical interventions to prolong life.  Ultimately she is hopeful for lung  transplant.  Patient's main concern today is constipation.  Education offered on pharmaceutical management specific to her scheduled Senna-S 2 tablets p.o. twice daily and MiraLAX 17 g twice daily.  Patient has not been consistently taking these prescribed medications.  Education offered on the use of opioids and increased risk of constipation.  Stressed again the importance of her taking scheduled GI agents to prevent constipation.   Lucindia verbalizes understanding.  Discussed with patient the importance of continued conversation with family and the medical providers regarding overall plan of care and treatment options,  ensuring decisions are within the context of the patients values and GOCs.  Questions and concerns addressed   PMT will continue to support holistically  Total time spent on the unit was 25 minutes   Discussed with Dr Halford Chessman  Greater than 50% of the time was spent in counseling and coordination of care  Wadie Lessen NP  Palliative Medicine Team Team Phone # [phone] Pager [phone]

## 2019-09-16 NOTE — Progress Notes (Addendum)
Occupational Therapy Treatment Patient Details Name: Felicia [name] MRN: [id number] DOB: Aug 15, 1970 Today's Date: 09/16/2019    History of present illness Patient is a 49 y/o female who presents with SOB, cough, weakness. 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 PMH includes nonspecific interstitial pneumonitis, DVT, DM, pulmonary HTN, diastolic HF, chronic hypoxic respiratory failure on 12-15L/min 02 [place]   OT comments  Pt with slow progress towards OT goals. She continues to have limitations mostly due to poor activity tolerance as well as decreased cardiorespiratory status. Pt on heated HFNC (initially on 35L bumped to 40-45L with activity, 70% fiO2). Pt with O2 sats grossly to the upper 70s/lower 80s with bed mobility and seated activity and requiring increased time to return to the mid-upper 80s. Pt participating in EOB activity at supervision level, tolerating sitting up >10 min during session. Pt requiring rest breaks throughout and increased time for all tasks. Applied 15L nonrebreather end of session and SpO2 significantly improved (up to upper 90s). Pt to benefit from continued acute OT services and recommend post acute OT services to progress pt towards her PLOF. Will follow.   Follow Up Recommendations  Home health OT;Supervision/Assistance - 24 hour;Other (comment) (pending progress )    Equipment Recommendations  3 in 1 bedside commode;Wheelchair (measurements OT);Wheelchair cushion (measurements OT)    Recommendations for Other Services      Precautions / Restrictions Precautions Precautions: Fall Precaution Comments: watch 02, on 35L HF, hx of fall this admission, nonrebreather used as needed by pt Restrictions Weight Bearing Restrictions: No       Mobility Bed Mobility Overal bed mobility: Needs Assistance Bed Mobility: Supine  to Sit;Sit to Supine     Supine to sit: Mod assist;+2 for safety/equipment Sit to supine: Mod assist;+2 for safety/equipment   General bed mobility comments: assist for full trunk elevation and to scoot hips towards EOB, requires rest breaks with transitions, assist with LEs onto bed. +2 to boost to [place] transfer comment: deferred, pt declined attempts due to fatigue and pt also with decreased O2 sats with seated activity today    Balance Overall balance assessment: Needs assistance Sitting-balance support: No upper extremity supported Sitting balance-[name]: Fair Sitting balance - Comments: supervision EOB with pt tending to rely on at least single UE support for added stability, able to maintain balance with no UE support; unable to reach far outside BOS                                   ADL either performed or assessed with clinical judgement   ADL Overall ADL's : Needs assistance/impaired     Grooming: Supervision/safety;Sitting;Wash/dry face Grooming Details (indicate cue type and reason): seated EOB             Lower Body Dressing: Total assistance;Sitting/lateral leans;Bed level Lower Body Dressing Details (indicate cue type and reason): totalA to don socks seated EOB     Toileting- Clothing Manipulation and Hygiene: Total assistance;Bed level         General ADL Comments: pt continues to have limitations due to poor cardiorespiratory status and overall activity tolerance  Cognition Arousal/Alertness: Awake/alert Behavior During Therapy: WFL for tasks assessed/performed Overall Cognitive Status: Within Functional Limits for tasks assessed                                 General Comments: tearful end of session         Exercises Exercises: General Lower Extremity;Other exercises;General Upper Extremity General Exercises - Lower Extremity Hip  Flexion/Marching: AAROM;Strengthening;Both;10 reps;Supine (with graded resistance in gross extension) Other Exercises Other Exercises: seated ball toss   Shoulder Instructions       General Comments      Pertinent Vitals/ Pain       Pain Assessment: Faces Faces Pain Scale: Hurts little more Pain Location: generalized  Pain Descriptors / Indicators: Discomfort;Grimacing;Guarding Pain Intervention(s): Monitored during session  Home Living                                          Prior Functioning/Environment              Frequency  Min 2X/week        Progress Toward Goals  OT Goals(current goals can now be found in the care plan section)  Progress towards OT goals: Progressing toward goals (slowly)  Acute Rehab OT Goals Patient Stated Goal: to be able to breathe OT Goal Formulation: With patient Time For Goal Achievement: 09/23/19 Potential to Achieve Goals: Fair ADL Goals Pt Will Perform Grooming: with modified independence;sitting Pt Will Perform Upper Body Bathing: with supervision;sitting Additional ADL Goal #1: Pt will perform bed mobility with supervision as precursor to EOB/OOB ADL. Additional ADL Goal #2: Pt will participate in OOB/mobility assessment as precursor to ADL. Additional ADL Goal #3: Pt will identify at least 3 energy conservation strategies to implement during ADL/functional tasks.  Plan Discharge plan remains appropriate    Co-evaluation    PT/OT/SLP Co-Evaluation/Treatment: Yes Reason for Co-Treatment: Complexity of the patient's impairments (multi-system involvement) (pt with poor tolerance to activity/multiple sessions)   OT goals addressed during session: ADL's and self-care      AM-PAC OT "6 Clicks" Daily Activity     Outcome Measure   Help from another person eating meals?: None Help from another person taking care of personal grooming?: A Little Help from another person toileting, which includes using  toliet, bedpan, or urinal?: Total Help from another person bathing (including washing, rinsing, drying)?: A Lot Help from another person to put on and taking off regular upper body clothing?: A Lot Help from another person to put on and taking off regular lower body clothing?: Total 6 Click Score: 13    End of Session Equipment Utilized During Treatment: Oxygen  OT Visit Diagnosis: Muscle weakness (generalized) (M62.81);Other (comment)   Activity Tolerance Patient limited by fatigue   Patient Left in bed;with call bell/phone within reach   Nurse Communication Mobility status        Time: 1343-1430 OT Time Calculation (min): 47 min  Charges: OT General Charges $OT Visit: 1 Visit OT Treatments $Self Care/Home Management : 8-22 mins $Therapeutic Activity: 8-22 mins  Lou Cal, OT Acute Rehabilitation Services Pager [phone] Office [phone]    Raymondo Band 09/16/2019, 4:43 PM

## 2019-09-16 NOTE — Progress Notes (Addendum)
Inpatient Diabetes Program Recommendations  AACE/ADA: New Consensus Statement on Inpatient Glycemic Control (2015)  Target Ranges:  Prepandial:   less than 140 mg/dL      Peak postprandial:   less than 180 mg/dL (1-2 hours)      Critically ill patients:  140 - 180 mg/dL   Results for MAGON, CROSON (MRN [id number]) as of 09/16/2019 09:24  Ref. Range 09/14/2019 07:27 09/14/2019 08:14 09/14/2019 11:24 09/14/2019 17:09 09/14/2019 22:01  Glucose-Capillary Latest Ref Range: 70 - 99 mg/dL 58 (L) 80 253 (H)  11 units NOVOLOG  254 (H)  11 units NOVOLOG  218 (H)  2 units NOVOLOG +  35 units LANTUS   Results for MAKAILEY, HODGKIN (MRN [id number]) as of 09/16/2019 09:24  Ref. Range 09/15/2019 07:22 09/15/2019 07:55 09/15/2019 11:21 09/15/2019 15:34 09/15/2019 22:02  Glucose-Capillary Latest Ref Range: 70 - 99 mg/dL 63 (L) 59 (L) 113 (H) 234 (H)  8 units NOVOLOG  264 (H)  3 units NOVOLOG +  25 units LANTUS   Results for LYCIA, SACHDEVA (MRN [id number]) as of 09/16/2019 09:24  Ref. Range 09/16/2019 07:31  Glucose-Capillary Latest Ref Range: 70 - 99 mg/dL 49 (L)    Home DM Meds: Lantus 30 units QHS       Humalog 0-20 units BID per SSI       Metformin 1000 mg QHS   Current Orders: Lantus 25 units QHS      Novolog Moderate Correction Scale/ SSI (0-15 units) TID AC + HS      Novolog 3 units TID with meals      Prednisone 40 mg Daily      MD- Note patient has had issues with Hypoglycemia the last several days.  Lantus has been reduced down to 25 units QHS.  Pt did receive 25 units Lantus last PM and CBG was 49 this AM.  Please consider:  1. Reduce Lantus further to 15 units QHS  2. Increase Novolog Meal Coverage to 5 units TID with meals     --Will follow patient during hospitalization--  Wyn Quaker RN, MSN, CDE Diabetes Coordinator Inpatient Glycemic Control Team Team Pager: [phone] (8a-5p)

## 2019-09-16 NOTE — Progress Notes (Signed)
Hypoglycemic Event  CBG: 49  Treatment: 4 oz juice/soda and breakfast tray  Symptoms: Shaky  Follow-up CBG: [id number] CBG Result:75  Possible Reasons for Event: Inadequate meal intake  Comments/MD notified: Hypoglycemic protocol    [name]

## 2019-09-16 NOTE — Progress Notes (Addendum)
Name: Felicia [name] MRN: [id number] DOB: 10-Aug-1970    ADMISSION DATE:  09/06/2019 CONSULTATION DATE:  09/16/2019 REFERRING MD :  Delphia Grates, triad CHIEF COMPLAINT: Respiratory distress, worsening hypoxia  BRIEF HISTORY:  49 yo female with fibrotic NSIP and severe pulmonary hypertension on 10 to 15 liters oxygen presented with worsening hypoxia.    PAST MEDICAL HISTORY:  NSIP, Pulmonary Hypertension, Chronic hypoxic respiratory failure, DVT popliteal vein April 2021, PNA, Shingles  SIGNIFICANT EVENTS:  7/27 admit 7/28 PCCM consult 7/30 palliative care consult. Patient remains very steadfast in hoping for ongoing pulmonary improvement  8/02 slight decrease in O2 support  8/06 Afib with RVR and hypotension, txfr to ICU  CONSULTS:  Cardiology  PROCEDURES:  Rt PICC 8/03 >>   SIGNIFICANT STUDIES:   Echocardiogram 04/2019 >> normal LV function, RVSP 64 , enlarged RV, bubble study negative.  PFTs 02/2017 >> FVC 1.25 L 36% predicted, FEV1 1.18 L 40% predicted with DLCO of 17   HRCT 02/2019 >> chronic fibrotic ILD progressed from 2018 with mild focus of honeycombing  Cath 3/ 2021 >> right atrial pressure was 7, PA pressure 60/24 with a mean of 39 and a wedge of 10 her cardiac index was low at 1.91 and output of 3.89. Her PVR is thus 7.  Echo 09/05/19 >> EF 50 to 55%, severe RV systolic dysfx, RVSP 06.2, mod/severe TR  CT chest 09/06/19 >> patchy GGO, interstitial coarsening and traction BTX with basilar predominance, no change compared to December 2018  MICRO:  7/28 BCx2>> 7/28 UC>>multiple species present, suggest recollection  ANTIBIOTICS:  Cefepime 7/27-7/28 Doxycycline 7/27- Vancomycin 7/27-7/28  SUBJECTIVE:  C/o nausea.  Constipation better.  Not having cough.  No chest pain.  Was able to sit on side of bed with PT.  OBJECTIVE:   VITAL SIGNS: Temp:  [97.5 F (36.4 C)-98.9 F (37.2 C)] 98.4 F (36.9 C) (08/09 0700) Pulse Rate:  [79-119] 112 (08/09 0833) Resp:   [14-41] 32 (08/09 0833) BP: (96-123)/(59-98) 115/97 (08/09 0800) SpO2:  [86 %-97 %] 94 % (08/09 0833) FiO2 (%):  [70 %] 70 % (08/09 0833) Weight:  [83.2 kg] 83.2 kg (08/09 0411)  Physical Exam:  General - alert Eyes - pupils reactive ENT - no sinus tenderness, no stridor Cardiac - irregular Chest - basilar crackles, no wheezing Abdomen - soft, non tender, + bowel sounds Extremities - no cyanosis, clubbing, or edema Skin - no rashes Neuro - normal strength, moves extremities, follows commands Psych - normal mood and behavior  RESOLVED PROBLEMS:    ASSESSMENT/PLAN:   Acute on chronic hypoxic respiratory failure. - from acute pneumonitis in setting of fibrotic NSIP, A fib with RVR, severe pulmonary HTN - uses 10 to 20 liters oxygen at home - goal SpO2 88 to 95%  Acute pneumonitis with hx of fibrotic NSIP. - continue cellcept, prednisone, duoneb - bactrim prophylaxis while on cellcept - followed by Dr. Randol Kern at [facility] with RVR new onset this admission. Hx of DVT. - continue amiodarone, eliquis, digoxin, toprol  Group 1, 2, 3 pulmonary hypertension. - continue revatio, torsemide - intolerant of tyvaso - followed by Dr. [name] at [name]  DM type 2 with steroid induced hyperglycemia. - SSI with lantus  Hx of depression. - continue zoloft  Deconditioning. - PT/OT  Pressure injury. - Lt buttock, present on admission - wound care  BEST PRACTICE:  Nutrition: Regular diet DVT prophylaxis: Eliquis SUP: Protonix Mobility: PT Goals of care: full code Disposition: ICU  LABS:  CMP Latest Ref Rng & Units 09/16/2019 09/15/2019 09/14/2019  Glucose 70 - 99 mg/dL 75 61(L) 313(H)  BUN 6 - 20 mg/dL 21(H) 22(H) 23(H)  Creatinine 0.44 - 1.00 mg/dL 0.71 0.66 0.75  Sodium 135 - 145 mmol/L 139 137 133(L)  Potassium 3.5 - 5.1 mmol/L 3.7 3.3(L) 4.0  Chloride 98 - 111 mmol/L 97(L) 91(L) 88(L)  CO2 22 - 32 mmol/L 31 32 33(H)  Calcium 8.9 - 10.3 mg/dL 8.8(L) 8.8(L) 8.2(L)    Total Protein 6.5 - 8.1 g/dL - - -  Total Bilirubin 0.3 - 1.2 mg/dL - - -  Alkaline Phos 38 - 126 U/L - - -  AST 15 - 41 U/L - - -  ALT 0 - 44 U/L - - -    CBC Latest Ref Rng & Units 09/12/2019 09/11/2019 09/10/2019  WBC 4.0 - 10.5 K/uL 18.7(H) 18.5(H) 18.1(H)  Hemoglobin 12.0 - 15.0 g/dL 13.4 12.2 12.3  Hematocrit 36 - 46 % 46.1(H) 42.2 42.3  Platelets 150 - 400 K/uL 220 204 192    ABG    Component Value Date/Time   PHART 7.497 (H) 08/08/2019 2224   PCO2ART 44.6 08/29/2019 2224   PO2ART 99 08/30/2019 2224   HCO3 34.6 (H) 08/20/2019 2224   TCO2 36 (H) 08/24/2019 2224   O2SAT 45.9 09/14/2019 0412    CBG (last 3)  Recent Labs    09/15/19 2202 09/16/19 0731 09/16/19 0840  GLUCAP 264* 49* 75    SIGNATURE:  Chesley Mires, MD [place] Pager - [phone] 09/16/2019, 10:01 AM

## 2019-09-17 ENCOUNTER — Inpatient Hospital Stay (HOSPITAL_COMMUNITY): Payer: 59

## 2019-09-17 LAB — GLUCOSE, CAPILLARY
Glucose-Capillary: 63 mg/dL — ABNORMAL LOW (ref 70–99)
Glucose-Capillary: 103 mg/dL — ABNORMAL HIGH (ref 70–99)
Glucose-Capillary: 95 mg/dL (ref 70–99)
Glucose-Capillary: 171 mg/dL — ABNORMAL HIGH (ref 70–99)
Glucose-Capillary: 189 mg/dL — ABNORMAL HIGH (ref 70–99)

## 2019-09-17 LAB — BASIC METABOLIC PANEL
Anion gap: 13 (ref 5–15)
BUN: 20 mg/dL (ref 6–20)
CO2: 31 mmol/L (ref 22–32)
Calcium: 8.9 mg/dL (ref 8.9–10.3)
Chloride: 95 mmol/L — ABNORMAL LOW (ref 98–111)
Creatinine, Ser: 0.72 mg/dL (ref 0.44–1.00)
GFR calc Af Amer: >60 mL/min (ref >60)
GFR calc non Af Amer: >60 mL/min (ref >60)
Glucose, Bld: 58 mg/dL — ABNORMAL LOW (ref 70–99)
Potassium: 3.5 mmol/L (ref 3.5–5.1)
Sodium: 139 mmol/L (ref 135–145)

## 2019-09-17 LAB — CBC
HCT: 42.9 % (ref 36.0–46.0)
Hemoglobin: 12.5 g/dL (ref 12.0–15.0)
MCH: 24.7 pg — ABNORMAL LOW (ref 26.0–34.0)
MCHC: 29.1 g/dL — ABNORMAL LOW (ref 30.0–36.0)
MCV: 84.6 fL (ref 80.0–100.0)
Platelets: 209 10*3/uL (ref 150–400)
RBC: 5.07 MIL/uL (ref 3.87–5.11)
RDW: 17.5 % — ABNORMAL HIGH (ref 11.5–15.5)
WBC: 18.5 10*3/uL — ABNORMAL HIGH (ref 4.0–10.5)
nRBC: 0.1 % (ref 0.0–0.2)

## 2019-09-17 MED ORDER — DEXTROSE 50 % IV SOLN
INTRAVENOUS | Status: AC
  Administered 2019-09-17: 25 mL
  Filled 2019-09-17: qty 50

## 2019-09-17 MED ORDER — TORSEMIDE 20 MG PO TABS
60.0000 mg | ORAL_TABLET | Freq: Two times a day (BID) | ORAL | Status: DC
  Administered 2019-09-17 – 2019-09-22 (×11): 60 mg via ORAL
  Filled 2019-09-17 (×12): qty 3

## 2019-09-17 NOTE — Progress Notes (Addendum)
Patient ID: Felicia [name], female   DOB: 06-04-1970, 49 y.o.   MRN: [id number]     Advanced Heart Failure Rounding Note  PCP-Cardiologist: Minus Breeding, MD   Subjective:    NSR on tele. HR 70s- 90s.   Remains on HFNC. Wt stable.   Main complaint today is constipation/empaction. Treated w/ enema. Feeling better.     Objective:   Weight Range: 84.3 kg Body mass index is 30.93 kg/m.   Vital Signs:   Temp:  [96.8 F (36 C)-98 F (36.7 C)] 98 F (36.7 C) (08/10 1100) Pulse Rate:  [71-113] 82 (08/10 1200) Resp:  [19-36] 24 (08/10 1200) BP: (90-123)/(40-110) 123/110 (08/10 1200) SpO2:  [84 %-100 %] 99 % (08/10 1200) FiO2 (%):  [60 %-80 %] 60 % (08/10 1229) Weight:  [84.3 kg] 84.3 kg (08/10 0411) Last BM Date: 09/15/19  Weight change: Filed Weights   09/15/19 0500 09/16/19 0411 09/17/19 0411  Weight: 83.8 kg 83.2 kg 84.3 kg    Intake/Output:   Intake/Output Summary (Last 24 hours) at 09/17/2019 1243 Last data filed at 09/17/2019 1200 Gross per 24 hour  Intake 828 ml  Output 1150 ml  Net -322 ml      Physical Exam   General: chronically ill/ fatigue appearing, requiring HFNC Neck: no JVD no thyromegaly or thyroid nodule.  Lungs: Decreased BS bilaterally. No wheezing  CV: Nondisplaced PMI.  Heart regular S1/S2, no S3/S4, no murmur.  No peripheral edema.   Abdomen: Soft, nontender, no hepatosplenomegaly, no distention.  Skin: Intact without lesions or rashes.  Neurologic: Alert and oriented x 3.  Psych: Normal affect. Extremities: No clubbing or cyanosis.  HEENT: + Exophthalmos, otherwise normal.    Telemetry   NSR 70s- 90s Personally reviewed.   EKG    n/a  Labs    CBC Recent Labs    09/17/19 0407  WBC 18.5*  HGB 12.5  HCT 42.9  MCV 84.6  PLT 382   Basic Metabolic Panel Recent Labs    09/16/19 0410 09/17/19 0407  NA 139 139  K 3.7 3.5  CL 97* 95*  CO2 31 31  GLUCOSE 75 58*  BUN 21* 20  CREATININE 0.71 0.72  CALCIUM 8.8* 8.9     Liver Function Tests No results for input(s): AST, ALT, ALKPHOS, BILITOT, PROT, ALBUMIN in the last 72 hours. No results for input(s): LIPASE, AMYLASE in the last 72 hours. Cardiac Enzymes No results for input(s): CKTOTAL, CKMB, CKMBINDEX, TROPONINI in the last 72 hours.  BNP: BNP (last 3 results) Recent Labs    05/31/19 0025 09/01/2019 2141 09/09/19 0715  BNP 1,085.4* 1,982.4* 544.6*    ProBNP (last 3 results) No results for input(s): PROBNP in the last 8760 hours.   D-Dimer No results for input(s): DDIMER in the last 72 hours. Hemoglobin A1C No results for input(s): HGBA1C in the last 72 hours. Fasting Lipid Panel No results for input(s): CHOL, HDL, LDLCALC, TRIG, CHOLHDL, LDLDIRECT in the last 72 hours. Thyroid Function Tests No results for input(s): TSH, T4TOTAL, T3FREE, THYROIDAB in the last 72 hours.  Invalid input(s): FREET3  Other results:   Imaging    DG Chest Port 1 View  Result Date: 09/17/2019 CLINICAL DATA:  Respiratory failure. EXAM: PORTABLE CHEST 1 VIEW COMPARISON:  09/11/2019 FINDINGS: The right PICC line is stable. Stable cardiac enlargement and prominent mediastinal and hilar contours. Persistent and slight worsening diffuse interstitial and airspace process in the lungs. No pleural effusions. IMPRESSION: Persistent and slight worsening diffuse interstitial  and airspace process. Electronically Signed   By: Marijo Sanes M.D.   On: 09/17/2019 07:42     Medications:     Scheduled Medications: . amiodarone  200 mg Oral BID  . apixaban  5 mg Oral BID  . calcium citrate  200 mg of elemental calcium Oral Daily  . Chlorhexidine Gluconate Cloth  6 each Topical Daily  . cholecalciferol  1,000 Units Oral Daily  . digoxin  0.125 mg Oral Daily  . escitalopram  10 mg Oral Daily  . insulin aspart  0-15 Units Subcutaneous TID WC  . insulin aspart  0-5 Units Subcutaneous QHS  . ipratropium-albuterol  3 mL Nebulization TID  . mouth rinse  15 mL Mouth Rinse  BID  . metoprolol succinate  25 mg Oral BID  . midodrine  5 mg Oral TID WC  . multivitamin with minerals  1 tablet Oral Daily  . mycophenolate  1,500 mg Oral BID  . nystatin  5 mL Oral QID  . pantoprazole  40 mg Oral Daily  . polyethylene glycol  17 g Oral BID  . potassium chloride  40 mEq Oral BID  . predniSONE  40 mg Oral Q breakfast  . Ensure Max Protein  11 oz Oral BID  . senna-docusate  2 tablet Oral BID  . sildenafil  40 mg Oral TID  . sodium chloride flush  10-40 mL Intracatheter Q12H  . sodium chloride flush  3 mL Intravenous Q12H  . sulfamethoxazole-trimethoprim  1 tablet Oral Once per day on Mon Wed Fri  . torsemide  60 mg Oral BID    Infusions: . sodium chloride 10 mL/hr at 09/17/19 1200  . dextrose 5 % and 0.9% NaCl Stopped (09/16/19 1824)    PRN Medications: sodium chloride, acetaminophen, albuterol, guaiFENesin-dextromethorphan, hydrocortisone, morphine injection, ondansetron, simethicone, sodium chloride flush, sodium chloride flush     Assessment/Plan   1. Acute on chronic hypoxemic respiratory failure: Multifactorial. Has ILD/NSIP with possible flare as well as pulmonary hypertension/RV failure. At baseline, on 10-15 L HFNC. She is now on 35 L HFNC.  - Continue treatment of ILD flare per pulmonary, switched from Solumedrol to  Prednisone, currently 40 mg daily , continuing mycophenolate. - volume status/wt stable.  Continue torsemide 60 mg daily for now.  2. Pulmonary hypertension: She has been followed at [facility], thought to have mixed group 1 and group 3 (ILD/NSIP) PH. She has been on sildenafiland was onTyvasoin the past. INCREASE study would suggest benefit for Tyvaso in patients like her. She does not like Tyvaso because it seems to trigger coughing=> she has stopped it again and refuses to restart. Echo shows D-shaped interventricular septum withseverelydilated and severely dysfunctional RV with estimated PASP 37mHg, moderate-severe TR, LV EF  50-55%, dilated IVC. Bubble study negativein the past.Volume status improved w/ diuretics.    - Continue sildenafil, have increased to 40 mg tid.  -She will not restart Tyvaso. Question efficacy of other selective pulmonary vasodilators given severe ILD and risk for V/Q mismatch (so not sure how helpful IV Flolan would be). - Added digoxin 0.125 mg daily for RV. Dig level ok 8/9 (0.4) - Continue midodrine 5 mg tid for soft BP. 3. RV failure:Volume status overall improved, stable on PO diuretics. She is now on midodrine for low BP.  - Continue torsemide 60 mg daily.  - Continue midodrine.  4. Atrial fibrillation with RVR: Paroxysmal, has also had MAT.   - currently NSR, HR 70s-90s  - Toprol XL 25 mg  bid.  - Continue po amio 200 mg bid for now, poor long-term amiodarone candidate (stop at discharge) - Continue Eliquis.  5. H/o DVT in 2020: On Eliquis 5 mg bid. 6. ILD: NSIP. She is on steroids + Cellcept at home, followed at [facility] as outpatient.  7. MAT/wandering atrial pacemaker: Noted on telemetry. Would use Toprol XL rather than atenolol. - currently NSR. Continue to monitor    Lyda Jester, PA-C  09/17/2019 12:43 PM  Patient seen with PA, agree with the above note.   Stable today, remains on 35 L HFNC.  Reasonable to increase torsemide to 60 mg bid.   She has had no further atrial fibrillation. Would continue amiodarone 200 mg bid for now, once she is through the current ILD flare, would stop amiodarone (would not continue long-term with severe lung disease).    No additional pulmonary vasodilator to add at this time, continue sildenafil.    We will follow at a distance, call with questions.   Loralie Champagne 09/17/2019

## 2019-09-17 NOTE — Progress Notes (Signed)
Pt called this RN to room to request bed change and clean up d/t incontinence episode.  This RN and the NT went to bedside to assist patient.  Patient unable to help turn and required multiple rest periods.  This RN was unable to properly clean patient d/t her not being able to assist or tolerate with our assistance.  Educated patient on need to have proper hygiene but patient unwilling.  Changed sheets and bed pad and gave bath as best as possible.

## 2019-09-17 NOTE — Progress Notes (Signed)
Name: Felicia [name] MRN: [id number] DOB: Dec 11, 1970    ADMISSION DATE:  08/11/2019 CONSULTATION DATE:  09/17/2019 REFERRING MD :  Delphia Grates, triad CHIEF COMPLAINT: Respiratory distress, worsening hypoxia  BRIEF HISTORY:  49 yo female with fibrotic NSIP and severe pulmonary hypertension on 10 to 15 liters oxygen presented with worsening hypoxia.    PAST MEDICAL HISTORY:  NSIP, Pulmonary Hypertension, Chronic hypoxic respiratory failure, DVT popliteal vein April 2021, PNA, Shingles  SIGNIFICANT EVENTS:  7/27 admit 7/28 PCCM consult 7/30 palliative care consult. Patient remains very steadfast in hoping for ongoing pulmonary improvement  8/02 slight decrease in O2 support  8/06 Afib with RVR and hypotension, txfr to ICU  CONSULTS:  Cardiology Palliative care  PROCEDURES:  Rt PICC 8/03 >>   SIGNIFICANT STUDIES:   Echocardiogram 04/2019 >> normal LV function, RVSP 64 , enlarged RV, bubble study negative.  PFTs 02/2017 >> FVC 1.25 L 36% predicted, FEV1 1.18 L 40% predicted with DLCO of 17   HRCT 02/2019 >> chronic fibrotic ILD progressed from 2018 with mild focus of honeycombing  Cath 3/ 2021 >> right atrial pressure was 7, PA pressure 60/24 with a mean of 39 and a wedge of 10 her cardiac index was low at 1.91 and output of 3.89. Her PVR is thus 7.  Echo 09/05/19 >> EF 50 to 55%, severe RV systolic dysfx, RVSP 34.7, mod/severe TR  CT chest 09/06/19 >> patchy GGO, interstitial coarsening and traction BTX with basilar predominance, no change compared to December 2018  MICRO:  7/28 BCx2 >> negative 7/28 UC>> multiple species   ANTIBIOTICS:  Cefepime 7/27-7/28 Vancomycin 7/27-7/28 Doxycycline 8/03-8/07  SUBJECTIVE:  Feels bloated and has to strain with BM.  This causes discomfort.  Has been feeling jittery and flush.  Noted to have low blood sugars.  OBJECTIVE:   VITAL SIGNS: Temp:  [96.8 F (36 C)-98 F (36.7 C)] 97.6 F (36.4 C) (08/10 0700) Pulse Rate:  [72-113] 94  (08/10 0815) Resp:  [20-40] 22 (08/10 0815) BP: (90-119)/(40-94) 97/40 (08/10 0815) SpO2:  [84 %-100 %] 98 % (08/10 0815) FiO2 (%):  [60 %-80 %] 60 % (08/10 0815) Weight:  [84.3 kg] 84.3 kg (08/10 0411)  Physical Exam:  General - alert Eyes - pupils reactive ENT - no sinus tenderness, no stridor Cardiac - regular rate/rhythm, no murmur Chest - b/l crackles Abdomen - soft, non tender, + bowel sounds Extremities - 2+ edema Skin - no rashes Neuro - normal strength, moves extremities, follows commands Psych - normal mood and behavior  CXR - increased edema pattern     RESOLVED PROBLEMS:    ASSESSMENT/PLAN:   Acute on chronic hypoxic respiratory failure. - from acute pneumonitis in setting of fibrotic NSIP, A fib with RVR with acute diastolic CHF and acute pulmonary edema, and severe pulmonary HTN - uses 10 to 20 liters oxygen at home - goal SpO2 88 to 95%  Acute pneumonitis with hx of fibrotic NSIP. - continue cellcept, duoneb - bactrim prophylaxis while on cellcept - continue prednisone 40 mg daily - bactrim prophylaxis while on cellcept - followed by Dr. Randol Kern at [facility] with RVR new onset this admission. Hx of DVT. - continue eliquis - amiodarone, digoxin, toprol per cardiology  Group 1, 2, 3 pulmonary hypertension. - continue revatio - increase torsemide to 60 mg bid on 8/10 - intolerant of tyvaso due to worsening cough symptoms - followed by Dr. [name] at [name]  DM type 2 with steroid induced hyperglycemia. -  having episodes of hypoglycemia with reduction in steroid dose - continue SSI - d/c lantus and meal coverage - prefer to have her run her blood sugars a little higher rather than risk of run too low  Hx of depression. - continue zoloft  Deconditioning. - PT/OT recommending home health, 3 in 1 bedside commode, wheelchair with cushion  Constipation. - continue senokot, miralax - tap water enema x one on 8/10 - bedside commode  Pressure  injury. - Lt buttock, present on admission - wound care  BEST PRACTICE:  Nutrition: Regular diet DVT prophylaxis: Eliquis SUP: Protonix Mobility: PT/OT Goals of care: full code Disposition: ICU  LABS:   CMP Latest Ref Rng & Units 09/17/2019 09/16/2019 09/15/2019  Glucose 70 - 99 mg/dL 58(L) 75 61(L)  BUN 6 - 20 mg/dL 20 21(H) 22(H)  Creatinine 0.44 - 1.00 mg/dL 0.72 0.71 0.66  Sodium 135 - 145 mmol/L 139 139 137  Potassium 3.5 - 5.1 mmol/L 3.5 3.7 3.3(L)  Chloride 98 - 111 mmol/L 95(L) 97(L) 91(L)  CO2 22 - 32 mmol/L 31 31 32  Calcium 8.9 - 10.3 mg/dL 8.9 8.8(L) 8.8(L)  Total Protein 6.5 - 8.1 g/dL - - -  Total Bilirubin 0.3 - 1.2 mg/dL - - -  Alkaline Phos 38 - 126 U/L - - -  AST 15 - 41 U/L - - -  ALT 0 - 44 U/L - - -    CBC Latest Ref Rng & Units 09/17/2019 09/12/2019 09/11/2019  WBC 4.0 - 10.5 K/uL 18.5(H) 18.7(H) 18.5(H)  Hemoglobin 12.0 - 15.0 g/dL 12.5 13.4 12.2  Hematocrit 36 - 46 % 42.9 46.1(H) 42.2  Platelets 150 - 400 K/uL 209 220 204    ABG    Component Value Date/Time   PHART 7.497 (H) 08/29/2019 2224   PCO2ART 44.6 08/22/2019 2224   PO2ART 99 08/12/2019 2224   HCO3 34.6 (H) 08/29/2019 2224   TCO2 36 (H) 08/18/2019 2224   O2SAT 77.9 09/16/2019 1840    CBG (last 3)  Recent Labs    09/16/19 1657 09/16/19 2156 09/17/19 0738  GLUCAP 385* 228* 63*    SIGNATURE:  Chesley Mires, MD [place] Pager - [phone] 09/17/2019, 8:22 AM

## 2019-09-17 NOTE — Progress Notes (Signed)
Hypoglycemic Event  CBG: 63  Treatment: 4 oz Apple juice and breakfast tray  Symptoms: Shakiness  Follow-up CBG: Time: 5027 CBG Result: 103  Possible Reasons for Event: Inadequate meal intake  Comments/MD notified:    Hinda Kehr

## 2019-09-18 DIAGNOSIS — J9621 Acute and chronic respiratory failure with hypoxia: Secondary | ICD-10-CM | POA: Diagnosis not present

## 2019-09-18 LAB — BASIC METABOLIC PANEL
Anion gap: 11 (ref 5–15)
BUN: 21 mg/dL — ABNORMAL HIGH (ref 6–20)
CO2: 32 mmol/L (ref 22–32)
Calcium: 8.7 mg/dL — ABNORMAL LOW (ref 8.9–10.3)
Chloride: 97 mmol/L — ABNORMAL LOW (ref 98–111)
Creatinine, Ser: 0.66 mg/dL (ref 0.44–1.00)
GFR calc Af Amer: >60 mL/min (ref >60)
GFR calc non Af Amer: >60 mL/min (ref >60)
Glucose, Bld: 106 mg/dL — ABNORMAL HIGH (ref 70–99)
Potassium: 3.4 mmol/L — ABNORMAL LOW (ref 3.5–5.1)
Sodium: 140 mmol/L (ref 135–145)

## 2019-09-18 LAB — GLUCOSE, CAPILLARY
Glucose-Capillary: 238 mg/dL — ABNORMAL HIGH (ref 70–99)
Glucose-Capillary: 261 mg/dL — ABNORMAL HIGH (ref 70–99)
Glucose-Capillary: 294 mg/dL — ABNORMAL HIGH (ref 70–99)
Glucose-Capillary: 395 mg/dL — ABNORMAL HIGH (ref 70–99)
Glucose-Capillary: 95 mg/dL (ref 70–99)

## 2019-09-18 MED ORDER — LIDOCAINE HCL URETHRAL/MUCOSAL 2 % EX GEL
1.0000 "application " | CUTANEOUS | Status: DC | PRN
  Administered 2019-09-18 – 2019-09-26 (×7): 1 via TOPICAL
  Filled 2019-09-18 (×10): qty 11

## 2019-09-18 MED ORDER — POTASSIUM CHLORIDE CRYS ER 20 MEQ PO TBCR
40.0000 meq | EXTENDED_RELEASE_TABLET | Freq: Once | ORAL | Status: AC
  Administered 2019-09-18: 40 meq via ORAL
  Filled 2019-09-18: qty 2

## 2019-09-18 NOTE — Progress Notes (Signed)
Pharmacy Electrolyte Replacement  Recent Labs:  Recent Labs    09/18/19 0440  K 3.4*  CREATININE 0.66    Low Critical Values (K </= 2.5, Phos </= 1, Mg </= 1) Present: None  MD Contacted: N/a - no critical values noted  Plan:  KCl 44mq PO x1 additional dose Follow K trend   CMercy Riding PharmD PGY1 Acute Care Pharmacy Resident Please refer to [facility]for [facility]-specific pharmacist

## 2019-09-18 NOTE — Progress Notes (Signed)
09/18/19 1059  Clinical Encounter Type  Visited With Patient  Visit Type Follow-up;Spiritual support  Referral From Palliative care team  Consult/Referral To Chaplain  Stress Factors  Patient Stress Factors Health changes;Lack of caregivers  This chaplain followed up with PMT Pt. Spiritual care. The Pt. shared she is pleased with the extra level of care in 70M. The Pt. plans for discharge home and a longer wait for [facility] admission because of the [place] were discussed with the chaplain. The chaplain understands the Pt. is looking forward to the rest at home but questions the level of Pt. care without over burdening her family. Today's conversation with the Pt. brought to light the Pt. willingness to step away from work and care for herself. The Pt. foresees her spirituality as a natural process instead of something she is looking for. The Pt. welcomed the chaplain's presence and clearly communicated it was time to rest at the end of the visit.  F/U spiritual care is available as needed.

## 2019-09-18 NOTE — Progress Notes (Addendum)
Nutrition Follow-up  DOCUMENTATION CODES:   Not applicable  INTERVENTION:    Continue Ensure Max po BID, each supplement provides 150 kcal and 30 grams of protein.   Encourage intake of meals and supplements.  NUTRITION DIAGNOSIS:   Increased nutrient needs related to wound healing as evidenced by estimated needs.  Ongoing   GOAL:   Patient will meet greater than or equal to 90% of their needs  Met   MONITOR:   PO intake, Supplement acceptance, Weight trends, Labs, I & O's  REASON FOR ASSESSMENT:   LOS    ASSESSMENT:   Patient with PMH significant for nonspecific interstitial pneumonitis, DM, pulmonary HTN, CHF, and chronic 10-15 L/min supplemental O2 requirement. Presents this admission with acute on chronic respiratory failure.  Patient transferred to the ICU on 8/6 after episode of A fib with RVR and hypotension. She has had some constipation, but improved with enema.  Palliative Care team is following. Currently on HFNC and NRB.  Remains on a regular diet, but PO intake has declined over the past few days. Meal intakes documented at 0-15% on 8/10. She is being offered Ensure Max supplements 2-3 times per day, but refusing most of them since transferring to the ICU on 8/6. She accepted one supplement 8/9 and one on 8/10.   Labs reviewed. K 3.4 CBG: 171-189-95  Medications reviewed and include calcium citrate, cholecalciferol, novolog, Miralax, KCl, prednisone, Senokot-S, MVI.  Diet Order:   Diet Order            Diet regular Room service appropriate? Yes; Fluid consistency: Thin; Fluid restriction: 1500 mL Fluid  Diet effective now                 EDUCATION NEEDS:   Not appropriate for education at this time  Skin:  Skin Assessment: Skin Integrity Issues: Skin Integrity Issues:: Stage II Stage II: buttocks  Last BM:  8/10 type 5  Height:   Ht Readings from Last 1 Encounters:  09/10/19 _0  (1.651 m)    Weight:   Wt Readings from Last 1  Encounters:  09/18/19 87.3 kg    BMI:  Body mass index is 32.03 kg/m.  Estimated Nutritional Needs:   Kcal:  2100-2300 kcal  Protein:  105-120 grams  Fluid:  >/= 2.1 L/day    Lucas Mallow, RD, LDN, CNSC Please refer to Amion for contact information.

## 2019-09-18 NOTE — Progress Notes (Signed)
Name: Felicia [name] MRN: [id number] DOB: 12-12-1970    ADMISSION DATE:  09/05/2019 CONSULTATION DATE:  09/18/2019 REFERRING MD :  Delphia Grates, triad CHIEF COMPLAINT: Respiratory distress, worsening hypoxia  BRIEF HISTORY:  49 yo female with fibrotic NSIP and severe pulmonary hypertension on 10 to 15 liters oxygen presented with worsening hypoxia.    PAST MEDICAL HISTORY:  NSIP, Pulmonary Hypertension, Chronic hypoxic respiratory failure, DVT popliteal vein April 2021, PNA, Shingles  SIGNIFICANT EVENTS:  7/27 admit 7/28 PCCM consult 7/30 palliative care consult. Patient remains very steadfast in hoping for ongoing pulmonary improvement  8/02 slight decrease in O2 support  8/06 Afib with RVR and hypotension, txfr to ICU  CONSULTS:  Cardiology >> s/o 8/10 Palliative care  PROCEDURES:  Rt PICC 8/03 >>   SIGNIFICANT STUDIES:   Echocardiogram 04/2019 >> normal LV function, RVSP 64 , enlarged RV, bubble study negative.  PFTs 02/2017 >> FVC 1.25 L 36% predicted, FEV1 1.18 L 40% predicted with DLCO of 17   HRCT 02/2019 >> chronic fibrotic ILD progressed from 2018 with mild focus of honeycombing  Cath 3/ 2021 >> right atrial pressure was 7, PA pressure 60/24 with a mean of 39 and a wedge of 10 her cardiac index was low at 1.91 and output of 3.89. Her PVR is thus 7.  Echo 09/05/19 >> EF 50 to 55%, severe RV systolic dysfx, RVSP 06.0, mod/severe TR  CT chest 09/06/19 >> patchy GGO, interstitial coarsening and traction BTX with basilar predominance, no change compared to December 2018  MICRO:  7/28 BCx2 >> negative 7/28 UC>> multiple species   ANTIBIOTICS:  Cefepime 7/27-7/28 Vancomycin 7/27-7/28 Doxycycline 8/03-8/07  SUBJECTIVE:  Breathing better.  Main concern is pain with bowel movements.  Tap water enema on 8/10 helped.  OBJECTIVE:   VITAL SIGNS: Temp:  [97.6 F (36.4 C)-98.1 F (36.7 C)] 97.6 F (36.4 C) (08/11 0752) Pulse Rate:  [71-96] 82 (08/11 0817) Resp:   [18-35] 24 (08/11 0817) BP: (86-141)/(47-110) 115/99 (08/11 0817) SpO2:  [87 %-99 %] 97 % (08/11 0817) FiO2 (%):  [55 %-100 %] 100 % (08/11 0817) Weight:  [87.3 kg] 87.3 kg (08/11 0439)  Physical Exam:  General - alert Eyes - pupils reactive ENT - no sinus tenderness, no stridor Cardiac - regular rate/rhythm with occasional extra beats, no murmur Chest - basilar crackles Abdomen - soft, non tender, + bowel sounds Extremities - no cyanosis, clubbing, or edema Skin - no rashes Neuro - normal strength, moves extremities, follows commands Psych - normal mood and behavior   RESOLVED PROBLEMS:    ASSESSMENT/PLAN:   Acute on chronic hypoxic respiratory failure. - from acute pneumonitis in setting of fibrotic NSIP, A fib with RVR with acute diastolic CHF and acute pulmonary edema, and severe pulmonary HTN - uses 10 to 20 liters oxygen at home - goal SpO2 88 to 95%  Acute pneumonitis with hx of fibrotic NSIP. - continue cellcept, duoneb - bactrim prophylaxis while on cellcept - continue prednisone 40 mg daily - bactrim prophylaxis while on cellcept - followed by Dr. Randol Kern at [place] with RVR new onset this admission. Hx of DVT. - continue eliquis, digoxin, toprol - can d/c amiodarone once ILD flare is improved per cardiology recommendation from 09/17/19  Group 1, 2, 3 pulmonary hypertension. - continue revatio - increased torsemide to 60 mg bid on 8/10 >> continue this dose for now - intolerant of tyvaso due to worsening cough symptoms - followed by Dr. [name] at [facility]  DM type 2 with steroid induced hyperglycemia. - having episodes of hypoglycemia with reduction in steroid dose - continue SSI - lantus d/c'ed on 8/10 - restart metformin 500 mg daily on 8/11  Hypokalemia. - replace potassium  Hx of depression. - continue zoloft  Deconditioning. - PT/OT recommending home health, 3 in 1 bedside commode, wheelchair with cushion  Constipation, anal fissure. -  continue senokot, miralax - sitz bath - prn 2% lidocaine jelly  Pressure injury. - Lt buttock, present on admission - wound care  BEST PRACTICE:  Nutrition: Regular diet DVT prophylaxis: Eliquis SUP: Protonix Mobility: PT/OT Goals of care: full code Disposition: she might be ready for d/c home in next 24 to 48 hrs  LABS:   CMP Latest Ref Rng & Units 09/18/2019 09/17/2019 09/16/2019  Glucose 70 - 99 mg/dL 106(H) 58(L) 75  BUN 6 - 20 mg/dL 21(H) 20 21(H)  Creatinine 0.44 - 1.00 mg/dL 0.66 0.72 0.71  Sodium 135 - 145 mmol/L 140 139 139  Potassium 3.5 - 5.1 mmol/L 3.4(L) 3.5 3.7  Chloride 98 - 111 mmol/L 97(L) 95(L) 97(L)  CO2 22 - 32 mmol/L 32 31 31  Calcium 8.9 - 10.3 mg/dL 8.7(L) 8.9 8.8(L)  Total Protein 6.5 - 8.1 g/dL - - -  Total Bilirubin 0.3 - 1.2 mg/dL - - -  Alkaline Phos 38 - 126 U/L - - -  AST 15 - 41 U/L - - -  ALT 0 - 44 U/L - - -    CBC Latest Ref Rng & Units 09/17/2019 09/12/2019 09/11/2019  WBC 4.0 - 10.5 K/uL 18.5(H) 18.7(H) 18.5(H)  Hemoglobin 12.0 - 15.0 g/dL 12.5 13.4 12.2  Hematocrit 36 - 46 % 42.9 46.1(H) 42.2  Platelets 150 - 400 K/uL 209 220 204    ABG    Component Value Date/Time   PHART 7.497 (H) 09/04/2019 2224   PCO2ART 44.6 08/27/2019 2224   PO2ART 99 08/19/2019 2224   HCO3 34.6 (H) 09/02/2019 2224   TCO2 36 (H) 08/26/2019 2224   O2SAT 77.9 09/16/2019 1840    CBG (last 3)  Recent Labs    09/17/19 1553 09/17/19 2209 09/18/19 0749  GLUCAP 171* 189* 95    SIGNATURE:  Chesley Mires, MD [place] Pager - [phone] 09/18/2019, 8:33 AM

## 2019-09-18 NOTE — Progress Notes (Signed)
PT Cancellation Note  Patient Details Name: Felicia [name] MRN: [id number] DOB: 1970/08/11   Cancelled Treatment:    Reason Eval/Treat Not Completed: Patient declined, no reason specified.  I don't feel well and now I have a new fistula hurting on my bottom.  I just don't want to today. 09/18/2019  Felicia Carne., PT Acute Rehabilitation Services [phone]  (pager) [phone]  (office)   Tessie Fass Felicia [name] 09/18/2019, 12:30 PM

## 2019-09-19 DIAGNOSIS — J9621 Acute and chronic respiratory failure with hypoxia: Secondary | ICD-10-CM | POA: Diagnosis not present

## 2019-09-19 LAB — HEMOGLOBIN A1C
Hgb A1c MFr Bld: 10.3 % — ABNORMAL HIGH (ref 4.8–5.6)
Hgb A1c MFr Bld: 10.2 % — ABNORMAL HIGH (ref 4.8–5.6)
Mean Plasma Glucose: 248.91 mg/dL
Mean Plasma Glucose: 246.04 mg/dL

## 2019-09-19 LAB — GLUCOSE, CAPILLARY
Glucose-Capillary: 50 mg/dL — ABNORMAL LOW (ref 70–99)
Glucose-Capillary: 89 mg/dL (ref 70–99)
Glucose-Capillary: 77 mg/dL (ref 70–99)
Glucose-Capillary: 266 mg/dL — ABNORMAL HIGH (ref 70–99)
Glucose-Capillary: 295 mg/dL — ABNORMAL HIGH (ref 70–99)
Glucose-Capillary: 266 mg/dL — ABNORMAL HIGH (ref 70–99)

## 2019-09-19 LAB — BASIC METABOLIC PANEL
Anion gap: 11 (ref 5–15)
BUN: 18 mg/dL (ref 6–20)
CO2: 35 mmol/L — ABNORMAL HIGH (ref 22–32)
Calcium: 9.1 mg/dL (ref 8.9–10.3)
Chloride: 95 mmol/L — ABNORMAL LOW (ref 98–111)
Creatinine, Ser: 0.77 mg/dL (ref 0.44–1.00)
GFR calc Af Amer: >60 mL/min (ref >60)
GFR calc non Af Amer: >60 mL/min (ref >60)
Glucose, Bld: 51 mg/dL — ABNORMAL LOW (ref 70–99)
Potassium: 3.7 mmol/L (ref 3.5–5.1)
Sodium: 141 mmol/L (ref 135–145)

## 2019-09-19 LAB — MAGNESIUM: Magnesium: 1.8 mg/dL (ref 1.7–2.4)

## 2019-09-19 MED ORDER — INSULIN ASPART 100 UNIT/ML ~~LOC~~ SOLN
0.0000 [IU] | Freq: Every day | SUBCUTANEOUS | Status: DC
  Administered 2019-09-19: 3 [IU] via SUBCUTANEOUS
  Administered 2019-09-20: 4 [IU] via SUBCUTANEOUS
  Administered 2019-09-22 – 2019-09-29 (×3): 3 [IU] via SUBCUTANEOUS

## 2019-09-19 MED ORDER — OXYMETAZOLINE HCL 0.05 % NA SOLN
1.0000 | Freq: Two times a day (BID) | NASAL | Status: DC
  Administered 2019-09-20 – 2019-09-21 (×3): 1 via NASAL
  Filled 2019-09-19: qty 30

## 2019-09-19 MED ORDER — CHLORHEXIDINE GLUCONATE CLOTH 2 % EX PADS
6.0000 | MEDICATED_PAD | Freq: Every day | CUTANEOUS | Status: DC
  Administered 2019-09-20 – 2019-10-02 (×13): 6 via TOPICAL

## 2019-09-19 MED ORDER — INSULIN ASPART 100 UNIT/ML ~~LOC~~ SOLN
0.0000 [IU] | Freq: Three times a day (TID) | SUBCUTANEOUS | Status: DC
  Administered 2019-09-19: 5 [IU] via SUBCUTANEOUS
  Administered 2019-09-19: 3 [IU] via SUBCUTANEOUS
  Administered 2019-09-20: 9 [IU] via SUBCUTANEOUS
  Administered 2019-09-20: 2 [IU] via SUBCUTANEOUS
  Administered 2019-09-20: 5 [IU] via SUBCUTANEOUS
  Administered 2019-09-21: 1 [IU] via SUBCUTANEOUS
  Administered 2019-09-21: 3 [IU] via SUBCUTANEOUS
  Administered 2019-09-21: 7 [IU] via SUBCUTANEOUS
  Administered 2019-09-22: 5 [IU] via SUBCUTANEOUS
  Administered 2019-09-22: 1 [IU] via SUBCUTANEOUS
  Administered 2019-09-23: 3 [IU] via SUBCUTANEOUS
  Administered 2019-09-23: 5 [IU] via SUBCUTANEOUS
  Administered 2019-09-24: 1 [IU] via SUBCUTANEOUS
  Administered 2019-09-25 – 2019-09-26 (×3): 2 [IU] via SUBCUTANEOUS
  Administered 2019-09-27: 3 [IU] via SUBCUTANEOUS
  Administered 2019-09-28: 2 [IU] via SUBCUTANEOUS
  Administered 2019-09-28 – 2019-09-29 (×2): 3 [IU] via SUBCUTANEOUS

## 2019-09-19 MED ORDER — OXYCODONE HCL 5 MG PO TABS
5.0000 mg | ORAL_TABLET | Freq: Four times a day (QID) | ORAL | Status: DC | PRN
  Administered 2019-09-20 – 2019-09-26 (×11): 5 mg via ORAL
  Filled 2019-09-19 (×14): qty 1

## 2019-09-19 NOTE — Progress Notes (Signed)
Occupational Therapy Treatment Patient Details Name: Felicia [name] MRN: [id number] DOB: November 02, 1970 Today's Date: 09/19/2019    History of present illness Patient is a 49 y/o female who presents with SOB, cough, weakness. 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 PMH includes nonspecific interstitial pneumonitis, DVT, DM, pulmonary HTN, diastolic HF, chronic hypoxic respiratory failure on 12-15L/min 02 [place]   OT comments  Pt agreeable to OT/PT session this date. Pt required modA+2 to progress to sitting EOB, maxA+2 to transfer to [facility]. Pt sat on BSC for 51mn with [facility] required 5 min rest break prior to progressing to supine. Pt continues to require frequent rest breaks throughout. Discussed pt's current level of functioning and pt agreed she will need follow-up therapy prior to d/cing home. Updated d/c recommendation to SNF. Pt will continue to benefit from skilled OT services to address activity tolerance, strengthening, endurance and energy conservation strategies to maximize independence and safety with ADL/IADL and functional mobility.   SpO2 25L HF [place] 88%-100% with NRB mask on 15LPM, RR up to 47 with exertion - RR 27 at rest  Follow Up Recommendations  Supervision/Assistance - 24 hour;SNF    Equipment Recommendations  3 in 1 bedside commode;Wheelchair (measurements OT);Wheelchair cushion (measurements OT)    Recommendations for Other Services      Precautions / Restrictions Precautions Precautions: Fall Precaution Comments: watch 02, on 35L HF, hx of fall this admission, nonrebreather used as needed by pt Restrictions Weight Bearing Restrictions: No       Mobility Bed Mobility Overal bed mobility: Needs Assistance Bed Mobility: Supine to Sit;Sit to Supine Rolling: Min assist   Supine to  sit: Mod assist;+2 for safety/equipment Sit to supine: +2 for safety/equipment;Max assist   General bed mobility comments: assist to progress trunk to upright posture and progress BLE to EOB, assist to progress hips to EOB as well  Transfers Overall transfer level: Needs assistance Equipment used:  (1 person face to face transfer) Transfers: Sit to/from [facility] to Stand: Max assist Stand pivot transfers: Max assist       General transfer comment: maxA with face to face transfer to stand from EOB, [facility]   Balance Overall balance assessment: Needs assistance Sitting-balance support: No upper extremity supported Sitting balance-[name]: Fair Sitting balance - Comments: minguard sitting EOB, decreased activity tolerance, pt able to sit unsupported   Standing balance support: Bilateral upper extremity supported Standing balance-[name]: Zero Standing balance comment: heavy reliance on BUE support                           ADL either performed or assessed with clinical judgement   ADL Overall ADL's : Needs assistance/impaired                         Toilet Transfer: Maximal assistance;Squat-pivot Toilet Transfer Details (indicate cue type and reason): [facility] on BSC for >11m Toileting- Clothing Manipulation and Hygiene: Sit to/from stand;Total assistance;+2 for physical assistance;+2 for safety/equipment Toileting - Clothing Manipulation Details (indicate cue type and reason): for pericare while pt standing     Functional mobility during ADLs: Maximal assistance General ADL Comments: continues to demonstrate decreased activity tolerance and generalized weakness  Vision       Perception     Praxis      Cognition Arousal/Alertness: Awake/alert Behavior During Therapy: WFL for tasks assessed/performed Overall Cognitive Status: Within Functional Limits for tasks  assessed                                          Exercises     Shoulder Instructions       General Comments SpO2 25L HF [place] 88%-100% with NRB mask on 15LPM, RR up to 47 with exertion - RR 27 at rest    Pertinent Vitals/ Pain       Pain Assessment: Faces Faces Pain Scale: Hurts little more Pain Location: generalized  Pain Descriptors / Indicators: Discomfort;Grimacing;Guarding Pain Intervention(s): Monitored during session;Limited activity within patient's tolerance  Home Living                                          Prior Functioning/Environment              Frequency  Min 2X/week        Progress Toward Goals  OT Goals(current goals can now be found in the care plan section)  Progress towards OT goals: Progressing toward goals  Acute Rehab OT Goals Patient Stated Goal: to be able to breathe OT Goal Formulation: With patient Time For Goal Achievement: 10/03/19 Potential to Achieve Goals: Fair ADL Goals Pt Will Perform Grooming: with modified independence;sitting Pt Will Perform Upper Body Bathing: with supervision;sitting Additional ADL Goal #1: Pt will perform bed mobility with supervision as precursor to EOB/OOB ADL. Additional ADL Goal #2: Pt will participate in OOB/mobility assessment as precursor to ADL. Additional ADL Goal #3: Pt will identify at least 3 energy conservation strategies to implement during ADL/functional tasks.  Plan Discharge plan needs to be updated    Co-evaluation    PT/OT/SLP Co-Evaluation/Treatment: Yes Reason for Co-Treatment: Complexity of the patient's impairments (multi-system involvement);For patient/therapist safety;To address functional/ADL transfers   OT goals addressed during session: ADL's and self-care      AM-PAC OT "6 Clicks" Daily Activity     Outcome Measure   Help from another person eating meals?: A Little Help from another person taking care of personal grooming?: A  Little Help from another person toileting, which includes using toliet, bedpan, or urinal?: Total Help from another person bathing (including washing, rinsing, drying)?: A Lot Help from another person to put on and taking off regular upper body clothing?: A Lot Help from another person to put on and taking off regular lower body clothing?: Total 6 Click Score: 12    End of Session Equipment Utilized During Treatment: Oxygen  OT Visit Diagnosis: Muscle weakness (generalized) (M62.81);Other (comment)   Activity Tolerance Patient limited by fatigue   Patient Left in bed;with call bell/phone within reach;with bed alarm set   Nurse Communication Mobility status        Time: 1351-1500 OT Time Calculation (min): 69 min  Charges: OT General Charges $OT Visit: 1 Visit OT Treatments $Self Care/Home Management : 23-37 mins  Helene Kelp OTR/L Acute Rehabilitation Services Office: [place] 09/19/2019, 4:00 PM

## 2019-09-19 NOTE — Progress Notes (Signed)
Name: Felicia [name] MRN: [id number] DOB: [birth year]    ADMISSION DATE:  08/17/2019 CONSULTATION DATE:  09/19/2019 REFERRING MD :  Delphia Grates, triad CHIEF COMPLAINT: Respiratory distress, worsening hypoxia  BRIEF HISTORY:  49 yo female with fibrotic NSIP and severe pulmonary hypertension on 10 to 15 liters oxygen presented with worsening hypoxia.    PAST MEDICAL HISTORY:  NSIP, Pulmonary Hypertension, Chronic hypoxic respiratory failure, DVT popliteal vein April 2021, PNA, Shingles  SIGNIFICANT EVENTS:  7/27 admit 7/28 PCCM consult 7/30 palliative care consult. Patient remains very steadfast in hoping for ongoing pulmonary improvement  8/02 slight decrease in O2 support  8/06 Afib with RVR and hypotension, txfr to ICU  CONSULTS:  Cardiology >> s/o 8/10 Palliative care  PROCEDURES:  Rt PICC 8/03 >>   SIGNIFICANT STUDIES:   Echocardiogram 04/2019 >> normal LV function, RVSP 64 , enlarged RV, bubble study negative.  PFTs 02/2017 >> FVC 1.25 L 36% predicted, FEV1 1.18 L 40% predicted with DLCO of 17   HRCT 02/2019 >> chronic fibrotic ILD progressed from 2018 with mild focus of honeycombing  Cath 3/ 2021 >> right atrial pressure was 7, PA pressure 60/24 with a mean of 39 and a wedge of 10 her cardiac index was low at 1.91 and output of 3.89. Her PVR is thus 7.  Echo 09/05/19 >> EF 50 to 55%, severe RV systolic dysfx, RVSP 78.6, mod/severe TR  CT chest 09/06/19 >> patchy GGO, interstitial coarsening and traction BTX with basilar predominance, no change compared to December 2018  MICRO:  7/28 BCx2 >> negative 7/28 UC>> multiple species   ANTIBIOTICS:  Cefepime 7/27-7/28 Vancomycin 7/27-7/28 Doxycycline 8/03-8/07  SUBJECTIVE:  Had several bowel movements yesterday.  C/o pain with BM.  Breathing okay.  OBJECTIVE:   VITAL SIGNS: Temp:  [97.3 F (36.3 C)-97.9 F (36.6 C)] 97.3 F (36.3 C) (08/12 0352) Pulse Rate:  [59-87] 72 (08/12 0800) Resp:  [13-35] 19 (08/12  0800) BP: (58-117)/(38-95) 103/68 (08/12 0800) SpO2:  [95 %-100 %] 95 % (08/12 0800) FiO2 (%):  [90 %-100 %] 90 % (08/12 0800) Weight:  [79.3 kg] 79.3 kg (08/12 0500)  Physical Exam:  General - alert Eyes - pupils reactive ENT - no sinus tenderness, no stridor Cardiac - regular rate/rhythm, no murmur Chest - basilar crackles Abdomen - soft, non tender, + bowel sounds Extremities - no cyanosis, clubbing, or edema Skin - no rashes Neuro - normal strength, moves extremities, follows commands Psych - normal mood and behavior  RESOLVED PROBLEMS:    ASSESSMENT/PLAN:   Acute on chronic hypoxic respiratory failure. - from acute pneumonitis in setting of fibrotic NSIP, A fib with RVR with acute diastolic CHF and acute pulmonary edema, and severe pulmonary HTN - uses 10 to 20 liters oxygen at home - goal SpO2 88 to 95%  Acute pneumonitis with hx of fibrotic NSIP. - continue cellcept, duoneb - bactrim prophylaxis while on cellcept - continue prednisone 40 mg daily - bactrim prophylaxis while on cellcept - followed by Dr. Randol Kern at [place] with RVR new onset this admission. Hx of DVT. - continue eliquis, digoxin, toprol - can d/c amiodarone once ILD flare is improved per cardiology recommendation from 09/17/19  Group 1, 2, 3 pulmonary hypertension. - continue revatio - continue torsemide 60 mg bid for now - intolerant of tyvaso due to worsening cough symptoms - followed by Dr[facility] [name] at [facility]  DM type 2 with steroid induced hyperglycemia. - having episodes of hypoglycemia with reduction in  steroid dose - d/c'ed lantus on 8/10 - change sliding scale - hold metformin  Hypokalemia. - replace potassium as needed  Hx of depression. - continue zoloft  Deconditioning. - PT/OT recommending home health, 3 in 1 bedside commode, wheelchair with cushion  Constipation, anal fissure. - continue senokot, miralax - sitz bath - prn 2% lidocaine jelly  Pressure injury. - Lt  buttock, present on admission - wound care  BEST PRACTICE:  Nutrition: Regular diet DVT prophylaxis: Eliquis SUP: Protonix Mobility: PT/OT Goals of care: full code Disposition: she might be ready for d/c home in next 24 to 48 hrs  LABS:   CMP Latest Ref Rng & Units 09/19/2019 09/18/2019 09/17/2019  Glucose 70 - 99 mg/dL 51(L) 106(H) 58(L)  BUN 6 - 20 mg/dL 18 21(H) 20  Creatinine 0.44 - 1.00 mg/dL 0.77 0.66 0.72  Sodium 135 - 145 mmol/L 141 140 139  Potassium 3.5 - 5.1 mmol/L 3.7 3.4(L) 3.5  Chloride 98 - 111 mmol/L 95(L) 97(L) 95(L)  CO2 22 - 32 mmol/L 35(H) 32 31  Calcium 8.9 - 10.3 mg/dL 9.1 8.7(L) 8.9  Total Protein 6.5 - 8.1 g/dL - - -  Total Bilirubin 0.3 - 1.2 mg/dL - - -  Alkaline Phos 38 - 126 U/L - - -  AST 15 - 41 U/L - - -  ALT 0 - 44 U/L - - -    CBC Latest Ref Rng & Units 09/17/2019 09/12/2019 09/11/2019  WBC 4.0 - 10.5 K/uL 18.5(H) 18.7(H) 18.5(H)  Hemoglobin 12.0 - 15.0 g/dL 12.5 13.4 12.2  Hematocrit 36 - 46 % 42.9 46.1(H) 42.2  Platelets 150 - 400 K/uL 209 220 204    ABG    Component Value Date/Time   PHART 7.497 (H) 08/25/2019 2224   PCO2ART 44.6 09/02/2019 2224   PO2ART 99 08/26/2019 2224   HCO3 34.6 (H) 09/04/2019 2224   TCO2 36 (H) 09/06/2019 2224   O2SAT 77.9 09/16/2019 1840    CBG (last 3)  Recent Labs    09/19/19 0332 09/19/19 0401 09/19/19 0758  GLUCAP 50* 89 77    SIGNATURE:  Chesley Mires, MD [place] Pager - [phone] 09/19/2019, 8:43 AM

## 2019-09-19 NOTE — Progress Notes (Signed)
Inpatient Diabetes Program Recommendations  AACE/ADA: New Consensus Statement on Inpatient Glycemic Control (2015)  Target Ranges:  Prepandial:   less than 140 mg/dL      Peak postprandial:   less than 180 mg/dL (1-2 hours)      Critically ill patients:  140 - 180 mg/dL   Lab Results  Component Value Date   GLUCAP 77 09/19/2019   HGBA1C 10.3 (H) 09/17/2019    Review of Glycemic Control Results for Felicia [name], Felicia [name] (MRN [id number]) as of 09/19/2019 10:55  Ref. Range 09/18/2019 07:49 09/18/2019 11:48 09/18/2019 16:10 09/18/2019 19:48 09/18/2019 23:32 09/19/2019 03:32 09/19/2019 04:01 09/19/2019 07:58  Glucose-Capillary Latest Ref Range: 70 - 99 mg/dL 95 261 (H)  Novolog 8 units 294 (H)  Novolog 8 units 395 (H)  Novolog 5 units given late at 2144 238 (H) 50 (L) 89 77   Current orders for Inpatient glycemic control:  Novolog 0-9 units tid + hs  PO prednisone 40 mg Daily Ensure max bid (does not effect glucose trends)  Inpatient Diabetes Program Recommendations:    Glucose trends increase after prednisone dose. Poor po intake, meal coverage not appropriate at this time. Insulin timing contributed to hypoglycemia yesterday.  Watch trends for now in hopes steroid doses can be reduced.  Thanks,  Tama Headings RN, MSN, BC-ADM Inpatient Diabetes Coordinator Team Pager [phone] (8a-5p)

## 2019-09-19 NOTE — Progress Notes (Signed)
Physical Therapy Treatment Patient Details Name: Felicia [name] MRN: [id number] DOB: 09/21/1970 Today's Date: 09/19/2019    History of Present Illness Patient is a 49 y/o female who presents with SOB, cough, weakness. 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 PMH includes nonspecific interstitial pneumonitis, DVT, DM, pulmonary HTN, diastolic HF, chronic hypoxic respiratory failure on 12-15L/min 02 [place]    PT Comments    Pt agreeable to participation today,  Pt reported pain at site of the fistula and she wanted to get on the [facility] to cleanse the area.  Emphasis on transitions to EOB with the assist of bed pads, sit to stand, transfers from bed to [facility] and BSC to the bed.      Follow Up Recommendations  SNF;Supervision/Assistance - 24 hour     Equipment Recommendations  Other (comment) (TBA next venue)    Recommendations for Other Services       Precautions / Restrictions Precautions Precautions: Fall Precaution Comments: watch 02, on 35L HF, hx of fall this admission, nonrebreather used as needed by pt Restrictions Weight Bearing Restrictions: No    Mobility  Bed Mobility Overal bed mobility: Needs Assistance Bed Mobility: Supine to Sit;Sit to Supine Rolling: Min assist   Supine to sit: Mod assist;+2 for safety/equipment Sit to supine: +2 for safety/equipment;Max assist   General bed mobility comments: assist to progress trunk to upright posture and progress BLE to EOB, assist to progress hips to EOB as well  Transfers Overall transfer level: Needs assistance Equipment used:  (1 person face to face transfer) Transfers: Sit to/from [name] Sit to Stand: Max assist Stand pivot transfers: Max assist       General transfer comment: maxA with face to face transfer to stand from EOB, maxA for stand-pivot transfer  to [name].  Bidet used on BSC to cleanse pt's fistula.  Ambulation/Gait                 Stairs             Wheelchair Mobility    Modified Rankin (Stroke Patients Only)       Balance Overall balance assessment: Needs assistance Sitting-balance support: No upper extremity supported Sitting balance-[name]: Fair Sitting balance - Comments: minguard sitting EOB, decreased activity tolerance, pt able to sit unsupported   Standing balance support: Bilateral upper extremity supported Standing balance-[name]: Zero Standing balance comment: heavy reliance on BUE support                            Cognition Arousal/Alertness: Awake/alert Behavior During Therapy: WFL for tasks assessed/performed Overall Cognitive Status: Within Functional Limits for tasks assessed                                        Exercises      General Comments General comments (skin integrity, edema, etc.): on 35 L HHFNC and 15L on NRB mask, sats stayed in the mid to upper 90's'      Pertinent Vitals/Pain Pain Assessment: Faces Faces Pain Scale: Hurts little more Pain Location: generalized  Pain Descriptors / Indicators: Discomfort;Grimacing;Guarding Pain Intervention(s): Monitored during session    Home Living  Prior Function            PT Goals (current goals can now be found in the care plan section) Acute Rehab PT Goals Patient Stated Goal: to be able to breathe PT Goal Formulation: With patient Time For Goal Achievement: 09/23/19 Potential to Achieve Goals: Fair Progress towards PT goals: Progressing toward goals (slow progress expected)    Frequency    Min 3X/week      PT Plan Current plan remains appropriate    Co-evaluation PT/OT/SLP Co-Evaluation/Treatment: Yes Reason for Co-Treatment: Complexity of the patient's impairments (multi-system involvement);For patient/therapist safety;To address  functional/ADL transfers PT goals addressed during session: Mobility/safety with mobility OT goals addressed during session: ADL's and self-care      AM-PAC PT "6 Clicks" Mobility   Outcome Measure  Help needed turning from your back to your side while in a flat bed without using bedrails?: A Little Help needed moving from lying on your back to sitting on the side of a flat bed without using bedrails?: A Lot Help needed moving to and from a bed to a chair (including a wheelchair)?: A Lot Help needed standing up from a chair using your arms (e.g., wheelchair or bedside chair)?: Total Help needed to walk in hospital room?: Total Help needed climbing 3-5 steps with a railing? : Total 6 Click Score: 10    End of Session Equipment Utilized During Treatment: Oxygen Activity Tolerance: Patient limited by fatigue;Other (comment) Patient left: in bed;with call bell/phone within reach;with bed alarm set Nurse Communication: Mobility status PT Visit Diagnosis: Other abnormalities of gait and mobility (R26.89);Muscle weakness (generalized) (M62.81)     Time: 7494-4967 PT Time Calculation (min) (ACUTE ONLY): 64 min  Charges:  $Therapeutic Activity: 23-37 mins                     09/19/2019  Ginger Carne., PT Acute Rehabilitation Services [phone]  (pager) [phone]  (office)   [name] 09/19/2019, 5:18 PM

## 2019-09-20 ENCOUNTER — Inpatient Hospital Stay (HOSPITAL_COMMUNITY): Payer: 59

## 2019-09-20 DIAGNOSIS — J9621 Acute and chronic respiratory failure with hypoxia: Secondary | ICD-10-CM | POA: Diagnosis not present

## 2019-09-20 LAB — BASIC METABOLIC PANEL
Anion gap: 13 (ref 5–15)
BUN: 27 mg/dL — ABNORMAL HIGH (ref 6–20)
CO2: 34 mmol/L — ABNORMAL HIGH (ref 22–32)
Calcium: 9.4 mg/dL (ref 8.9–10.3)
Chloride: 91 mmol/L — ABNORMAL LOW (ref 98–111)
Creatinine, Ser: 0.88 mg/dL (ref 0.44–1.00)
GFR calc Af Amer: >60 mL/min (ref >60)
GFR calc non Af Amer: >60 mL/min (ref >60)
Glucose, Bld: 179 mg/dL — ABNORMAL HIGH (ref 70–99)
Potassium: 3.9 mmol/L (ref 3.5–5.1)
Sodium: 138 mmol/L (ref 135–145)

## 2019-09-20 LAB — CBC
HCT: 43.1 % (ref 36.0–46.0)
Hemoglobin: 12.3 g/dL (ref 12.0–15.0)
MCH: 24.2 pg — ABNORMAL LOW (ref 26.0–34.0)
MCHC: 28.5 g/dL — ABNORMAL LOW (ref 30.0–36.0)
MCV: 84.8 fL (ref 80.0–100.0)
Platelets: 185 10*3/uL (ref 150–400)
RBC: 5.08 MIL/uL (ref 3.87–5.11)
RDW: 17.7 % — ABNORMAL HIGH (ref 11.5–15.5)
WBC: 14.8 10*3/uL — ABNORMAL HIGH (ref 4.0–10.5)
nRBC: 0 % (ref 0.0–0.2)

## 2019-09-20 LAB — GLUCOSE, CAPILLARY
Glucose-Capillary: 165 mg/dL — ABNORMAL HIGH (ref 70–99)
Glucose-Capillary: 256 mg/dL — ABNORMAL HIGH (ref 70–99)
Glucose-Capillary: 411 mg/dL — ABNORMAL HIGH (ref 70–99)
Glucose-Capillary: 304 mg/dL — ABNORMAL HIGH (ref 70–99)

## 2019-09-20 MED ORDER — INSULIN ASPART 100 UNIT/ML ~~LOC~~ SOLN
2.0000 [IU] | SUBCUTANEOUS | Status: DC
  Administered 2019-09-21: 2 [IU] via SUBCUTANEOUS

## 2019-09-20 NOTE — Progress Notes (Signed)
Pt arrived on unit CBG 411 MD paged [name], [place] coverage was given and MD want to monitor because pt has Hx of hypoglycemia no new orders given.

## 2019-09-20 NOTE — Progress Notes (Signed)
Pt transferred to 2O-17 without complications. All of pt's belongings Development worker, community, Cabin crew, computer, etc.) sent with pt. At bedside.

## 2019-09-20 NOTE — Progress Notes (Signed)
Name: Felicia [name] MRN: [id number] DOB: August 31, 1970    ADMISSION DATE:  09/04/2019 CONSULTATION DATE:  09/20/2019 REFERRING MD :  Delphia Grates, [facility]ad CHIEF COMPLAINT: Respiratory distress, worsening hypoxia  BRIEF HISTORY:  49 yo female with fibrotic NSIP and severe pulmonary hypertension on 10 to 15 liters oxygen presented with worsening hypoxia.    PAST MEDICAL HISTORY:  NSIP, Pulmonary Hypertension, Chronic hypoxic respiratory failure, DVT popliteal vein April 2021, PNA, Shingles  SIGNIFICANT EVENTS:  7/27 admit 7/28 PCCM consult 7/30 palliative care consult. Patient remains very steadfast in hoping for ongoing pulmonary improvement  8/02 slight decrease in O2 support  8/06 Afib with RVR and hypotension, txfr to ICU 8/13 breathing is a little bit more comfortable  CONSULTS:  Cardiology >> s/o 8/10 Palliative care  PROCEDURES:  Rt PICC 8/03 >>   SIGNIFICANT STUDIES:   Echocardiogram 04/2019 >> normal LV function, RVSP 64 , enlarged RV, bubble study negative.  PFTs 02/2017 >> FVC 1.25 L 36% predicted, FEV1 1.18 L 40% predicted with DLCO of 17   HRCT 02/2019 >> chronic fibrotic ILD progressed from 2018 with mild focus of honeycombing  Cath 3/ 2021 >> right atrial pressure was 7, PA pressure 60/24 with a mean of 39 and a wedge of 10 her cardiac index was low at 1.91 and output of 3.89. Her PVR is thus 7.  Echo 09/05/19 >> EF 50 to 55%, severe RV systolic dysfx, RVSP 28.3, mod/severe TR  CT chest 09/06/19 >> patchy GGO, interstitial coarsening and traction BTX with basilar predominance, no change compared to December 2018  MICRO:  7/28 BCx2 >> negative 7/28 UC>> multiple species   ANTIBIOTICS:  Cefepime 7/27-7/28 Vancomycin 7/27-7/28 Doxycycline 8/03-8/07  SUBJECTIVE:  Had several bowel movements 8/11.  C/o pain with BM.  Breathing okay. Breathing more comfortable OBJECTIVE:   VITAL SIGNS: Temp:  [97.6 F (36.4 C)-98.3 F (36.8 C)] 97.6 F (36.4 C) (08/13  0800) Pulse Rate:  [66-88] 87 (08/13 0900) Resp:  [14-31] 26 (08/13 0900) BP: (81-114)/(58-96) 101/71 (08/13 0900) SpO2:  [85 %-100 %] 88 % (08/13 0900) FiO2 (%):  [80 %-90 %] 80 % (08/13 0800) Weight:  [78.8 kg] 78.8 kg (08/13 0500)  Physical Exam:  General - alert Eyes -pupils reactive ENT -moist oral mucosa Cardiac -S1-S2 appreciated Chest - basilar crackles Abdomen - soft, non tender, + bowel sounds Extremities - no cyanosis, clubbing, or edema Skin -warm and dry Neuro -moving all extremities Psych - normal mood and behavior  RESOLVED PROBLEMS:    ASSESSMENT/PLAN:   Acute on chronic hypoxic respiratory failure. -Pneumonitis in the setting of fibrotic NSIP -Continue oxygen supplementation -Saturation goals of 88 to 95%  Severe pulmonary hypertension group 1,2,3 -Secondary to advanced pulmonary fibrosis -Continue lines of care  Acute pneumonitis with history of fibrotic NSIP -On CellCept -On prednisone -Bactrim prophylaxis -[facility] Randol [facility]  Atrial fibrillation with RVR  -Continue anticoagulation with Eliquis, digoxin, Toprol -Discontinue amiodarone at discharge-will be a poor long-term candidate  Group 1, 2, 3 pulmonary hypertension. - continue revatio - continue torsemide 60 mg bid for now - intolerant of tyvaso due to worsening cough symptoms - followed by Dr. [name] at [facility]  DM type 2 with steroid induced hyperglycemia. - having episodes of hypoglycemia with reduction in steroid dose -Lantus discontinued - hold metformin  Hypokalemia. - replace potassium as needed  Hx of depression. - continue zoloft  Deconditioning. - PT/OT recommending home health, 3 in 1 bedside commode, wheelchair with cushion  Constipation, anal fissure. - continue senokot, miralax - sitz bath - prn 2% lidocaine jelly  Pressure injury. - Lt buttock, present on admission - wound care  We will transfer to [facility] hospitalist service We will follow on  the floor to see how she is doing Goal is to transition to skilled nursing facility  BEST PRACTICE:  Nutrition: Regular diet DVT prophylaxis: Eliquis SUP: Protonix Mobility: PT/OT Goals of care: full code Disposition: Transfer to progressive care                     Goal is to go to skilled nursing facility                     Social work assisting with discharge plans LABS:   CMP Latest Ref Rng & Units 09/20/2019 09/19/2019 09/18/2019  Glucose 70 - 99 mg/dL 179(H) 51(L) 106(H)  BUN 6 - 20 mg/dL 27(H) 18 21(H)  Creatinine 0.44 - 1.00 mg/dL 0.88 0.77 0.66  Sodium 135 - 145 mmol/L 138 141 140  Potassium 3.5 - 5.1 mmol/L 3.9 3.7 3.4(L)  Chloride 98 - 111 mmol/L 91(L) 95(L) 97(L)  CO2 22 - 32 mmol/L 34(H) 35(H) 32  Calcium 8.9 - 10.3 mg/dL 9.4 9.1 8.7(L)  Total Protein 6.5 - 8.1 g/dL - - -  Total Bilirubin 0.3 - 1.2 mg/dL - - -  Alkaline Phos 38 - 126 U/L - - -  AST 15 - 41 U/L - - -  ALT 0 - 44 U/L - - -    CBC Latest Ref Rng & Units 09/20/2019 09/17/2019 09/12/2019  WBC 4.0 - 10.5 K/uL 14.8(H) 18.5(H) 18.7(H)  Hemoglobin 12.0 - 15.0 g/dL 12.3 12.5 13.4  Hematocrit 36 - 46 % 43.1 42.9 46.1(H)  Platelets 150 - 400 K/uL 185 209 220    ABG    Component Value Date/Time   PHART 7.497 (H) 08/16/2019 2224   PCO2ART 44.6 08/20/2019 2224   PO2ART 99 08/18/2019 2224   HCO3 34.6 (H) 08/27/2019 2224   TCO2 36 (H) 09/05/2019 2224   O2SAT 77.9 09/16/2019 1840    CBG (last 3)  Recent Labs    09/19/19 1558 09/19/19 2134 09/20/19 0804  GLUCAP 295* 266* 165*   The patient is critically ill with multiple organ systems failure and requires high complexity decision making for assessment and support, frequent evaluation and titration of therapies, application of advanced monitoring technologies and extensive interpretation of multiple databases. Critical Care Time devoted to patient care services described in this note independent of APP/resident time (if applicable)  is 30 minutes.    Sherrilyn Rist MD [facility] Personal pager: [phone] If unanswered, please page CCM On-call: [phone]

## 2019-09-20 NOTE — [place] FL2 (Signed)
[place] MEDICAID FL2 LEVEL OF CARE SCREENING TOOL     IDENTIFICATION  Patient Name: Felicia [name] Birthdate: 06-May-1970 Sex: female Admission Date (Current Location): 08/17/2019  [name] and [place] Number:      Facility and Address:  The [place], [place] [address] [place],  [address]      Provider Number:    Attending Physician Name and Address:  Laurin Coder, MD  Relative Name and Phone Number:       Current Level of Care: Hospital Recommended Level of Care: [place] Prior Approval Number:    Date Approved/Denied:   PASRR Number: [id number]  Discharge Plan: SNF    Current Diagnoses: Patient Active Problem List   Diagnosis Date Noted   Constipation    Abdominal pain    Atrial fibrillation ([place])    Hypoxia    Palliative care by specialist    Pressure injury of skin 09/04/2019   Uncontrolled diabetes mellitus with hyperglycemia (HCC)    Elevated lactic acid level    Elevated troponin    Class 2 obesity due to excess calories in adult 06/04/2019   Hypokalemia 05/31/2019   History of DVT (deep vein thrombosis) 05/31/2019   Prolonged Q-T interval on ECG 05/31/2019   Hypomagnesemia 05/31/2019   Acute on chronic respiratory failure with hypoxia ([place]) 05/30/2019   Shingles 04/24/2019   Chest pain    CAP (community acquired pneumonia) 03/31/2018   Interstitial lung disease ([place]) 03/27/2018   Palpitations 03/20/2018   Acute on chronic diastolic CHF (congestive heart failure) ([place]) 03/21/2017   Pulmonary HTN ([place]) 03/21/2017   DNR (do not resuscitate) discussion 03/21/2017   NSIP (nonspecific interstitial pneumonitis) ([place]) 02/03/2017   Essential hypertension 02/03/2017   Dyspnea on exertion    On home oxygen therapy    PSVT (paroxysmal supraventricular tachycardia) ([place]) 09/23/2014   Interstitial pneumonitis ([place]) 09/23/2014   UNSPEC ALVEOLAR&PARIETOALVEOLAR PNEUMONOPATHY  09/12/2008   HYPERTENSION 09/11/2008   BRONCHITIS 09/11/2008    Orientation RESPIRATION BLADDER Height & Weight     Self, Time, Situation, Place  O2 (HFNC, Non-rebreather) External catheter Weight: 173 lb 11.6 oz (78.8 kg) Height:  5' 5" (165.1 cm)  BEHAVIORAL SYMPTOMS/MOOD NEUROLOGICAL BOWEL NUTRITION STATUS      Continent Diet  AMBULATORY STATUS COMMUNICATION OF NEEDS Skin   Extensive Assist Verbally                         Personal Care Assistance Level of Assistance  Bathing, Feeding, Dressing Bathing Assistance: Limited assistance Feeding assistance: Independent Dressing Assistance: Limited assistance     Functional Limitations Info             SPECIAL CARE FACTORS FREQUENCY  PT (By licensed PT), OT (By licensed OT)     PT Frequency: 5x weekly OT Frequency: 5x weekly            Contractures Contractures Info: Not present    Additional Factors Info  Code Status Code Status Info: Full             Current Medications (09/20/2019):  This is the current hospital active medication list Current Facility-Administered Medications  Medication Dose Route Frequency Provider Last Rate Last Admin   acetaminophen (TYLENOL) tablet 650 mg  650 mg Oral Q4H PRN Opyd, Ilene Qua, MD   650 mg at 09/19/19 0852   albuterol (PROVENTIL) (2.5 MG/3ML) 0.083% nebulizer solution 2.5 mg  2.5 mg Nebulization Q2H PRN Opyd, Christia Reading  S, MD       amiodarone (PACERONE) tablet 200 mg  200 mg Oral BID Larey Dresser, MD   200 mg at 09/19/19 2114   apixaban (ELIQUIS) tablet 5 mg  5 mg Oral BID Opyd, Ilene Qua, MD   5 mg at 09/19/19 2114   calcium citrate (CALCITRATE - dosed in mg elemental calcium) tablet 200 mg of elemental calcium  200 mg of elemental calcium Oral Daily Chesley Mires, MD   200 mg of elemental calcium at 09/19/19 0900   Chlorhexidine Gluconate Cloth 2 % PADS 6 each  6 each Topical Daily Chesley Mires, MD   6 each at 09/20/19 3710   cholecalciferol (VITAMIN D3)  tablet 1,000 Units  1,000 Units Oral Daily Chesley Mires, MD   1,000 Units at 09/19/19 0902   digoxin (LANOXIN) tablet 0.125 mg  0.125 mg Oral Daily Clegg, Amy D, NP   0.125 mg at 09/19/19 0902   escitalopram (LEXAPRO) tablet 10 mg  10 mg Oral Daily Irene Pap N, DO   10 mg at 09/19/19 0902   guaiFENesin-dextromethorphan (ROBITUSSIN DM) 100-10 MG/5ML syrup 5 mL  5 mL Oral Q4H PRN Irene Pap N, DO   5 mL at 09/10/19 2149   hydrocortisone (ANUSOL-HC) 2.5 % rectal cream   Rectal PRN Kayleen Memos, DO   Given at 09/12/19 1054   insulin aspart (novoLOG) injection 0-5 Units  0-5 Units Subcutaneous QHS Chesley Mires, MD   3 Units at 09/19/19 2135   insulin aspart (novoLOG) injection 0-9 Units  0-9 Units Subcutaneous TID WC Chesley Mires, MD   2 Units at 09/20/19 0814   lidocaine (XYLOCAINE) 2 % jelly 1 application  1 application Topical PRN Chesley Mires, MD   1 application at 62/69/48 2141   MEDLINE mouth rinse  15 mL Mouth Rinse BID Chesley Mires, MD   15 mL at 09/19/19 2117   metoprolol succinate (TOPROL-XL) 24 hr tablet 25 mg  25 mg Oral BID Gleason, Otilio Carpen, PA-C   25 mg at 09/19/19 2050   midodrine (PROAMATINE) tablet 5 mg  5 mg Oral TID WC Lyda Jester M, PA-C   5 mg at 09/20/19 0810   multivitamin with minerals tablet 1 tablet  1 tablet Oral Daily Irene Pap N, DO   1 tablet at 09/19/19 0900   mycophenolate (CELLCEPT) capsule 1,500 mg  1,500 mg Oral BID Tawni Millers, MD   1,500 mg at 09/19/19 2115   ondansetron (ZOFRAN) injection 4 mg  4 mg Intravenous Q6H PRN Chesley Mires, MD   4 mg at 09/19/19 0805   oxyCODONE (Oxy IR/ROXICODONE) immediate release tablet 5 mg  5 mg Oral Q6H PRN Chesley Mires, MD   5 mg at 09/20/19 0622   oxymetazoline (AFRIN) 0.05 % nasal spray 1 spray  1 spray Each Nare BID Chesley Mires, MD       pantoprazole (PROTONIX) EC tablet 40 mg  40 mg Oral Daily [place], Archie Patten N, DO   40 mg at 09/19/19 0902   polyethylene glycol (MIRALAX / GLYCOLAX)  packet 17 g  17 g Oral BID Irene Pap N, DO   17 g at 09/18/19 2143   potassium chloride SA (KLOR-CON) CR tablet 40 mEq  40 mEq Oral BID Larey Dresser, MD   40 mEq at 09/20/19 0809   predniSONE (DELTASONE) tablet 40 mg  40 mg Oral Q breakfast Collene Gobble, MD   40 mg at 09/20/19 0810   protein supplement (  ENSURE MAX) liquid  11 oz Oral BID Kayleen Memos, DO   11 oz at 09/19/19 2129   senna-docusate (Senokot-S) tablet 2 tablet  2 tablet Oral BID Irene Pap N, DO   2 tablet at 09/18/19 2142   sildenafil (REVATIO) tablet 40 mg  40 mg Oral TID Darrick Grinder D, NP   40 mg at 09/19/19 2116   simethicone (MYLICON) chewable tablet 80 mg  80 mg Oral QID PRN Tawni Millers, MD   80 mg at 09/16/19 1139   sodium chloride flush (NS) 0.9 % injection 10-40 mL  10-40 mL Intracatheter PRN Irene Pap N, DO       sulfamethoxazole-trimethoprim (BACTRIM DS) 800-160 MG per tablet 1 tablet  1 tablet Oral Once per day on Mon Wed Fri Sood, Vineet, MD   1 tablet at 09/18/19 0835   torsemide (DEMADEX) tablet 60 mg  60 mg Oral BID Chesley Mires, MD   60 mg at 09/20/19 3702     Discharge Medications: Please see discharge summary for a list of discharge medications.  Relevant Imaging Results:  Relevant Lab Results:   Additional Information SSN: [id number]  Archie Endo, LCSW

## 2019-09-20 NOTE — TOC Initial Note (Signed)
Transition of Care [facility]) - Initial/Assessment Note    Patient Details  Name: Felicia [name] MRN: [id number] Date of Birth: [birth year]  [facility] CM/SW Contact:    Archie Endo, LCSW Phone Number: 09/20/2019, 9:09 AM  Clinical Narrative:                 CSW received consult for patient for SNF placement. CSW met with patient at bedside to complete a discussion. Patient states she lives at home with her 50 year old daughter and 50 year old grandson. Patient states she is agreeable to SNF placement and has never been before. Patient agreeable for CSW initiate the placement process. Patient's only preference is for placement in [facility].   CSW will complete patient's PASSR screening, FL2, and fax her documentation out for review.  Expected Discharge Plan: Skilled Nursing Facility Barriers to Discharge: Continued Medical Work up, SNF Pending bed offer   Patient Goals and CMS Choice        Expected Discharge Plan and Services Expected Discharge Plan: [place] arrangements for the past 2 months: Single Family Home                                      Prior Living Arrangements/Services Living arrangements for the past 2 months: Single Family Home Lives with:: Adult Children Patient language and need for interpreter reviewed:: Yes        Need for Family Participation in Patient Care: Yes (Comment) Care giver support system in place?: Yes (comment)   Criminal Activity/Legal Involvement Pertinent to Current Situation/Hospitalization: No - Comment as needed  Activities of Daily Living      Permission Sought/Granted                  Emotional Assessment Appearance:: Appears stated age Attitude/Demeanor/Rapport: Engaged, Gracious Affect (typically observed): Appropriate, Accepting Orientation: : Oriented to Situation, Oriented to  Time, Oriented to Self, Oriented to Place Alcohol / Substance Use: Not  Applicable Psych Involvement: No (comment)  Admission diagnosis:  Acute on chronic respiratory failure with hypoxia ([place]) [J96.21] Patient Active Problem List   Diagnosis Date Noted  . Constipation   . Abdominal pain   . Atrial fibrillation ([place])   . Hypoxia   . Palliative care by specialist   . Pressure injury of skin 09/04/2019  . Uncontrolled diabetes mellitus with hyperglycemia ([place])   . Elevated lactic acid level   . Elevated troponin   . Class 2 obesity due to excess calories in adult 06/04/2019  . Hypokalemia 05/31/2019  . History of DVT (deep vein thrombosis) 05/31/2019  . Prolonged Q-T interval on ECG 05/31/2019  . Hypomagnesemia 05/31/2019  . Acute on chronic respiratory failure with hypoxia ([place]) 05/30/2019  . Shingles 04/24/2019  . Chest pain   . CAP (community acquired pneumonia) 03/31/2018  . Interstitial lung disease ([place]) 03/27/2018  . Palpitations 03/20/2018  . Acute on chronic diastolic CHF (congestive heart failure) ([place]) 03/21/2017  . Pulmonary HTN ([place]) 03/21/2017  . DNR (do not resuscitate) discussion 03/21/2017  . NSIP (nonspecific interstitial pneumonitis) ([place]) 02/03/2017  . Essential hypertension 02/03/2017  . Dyspnea on exertion   . On home oxygen therapy   . PSVT (paroxysmal supraventricular tachycardia) ([place]) 09/23/2014  . Interstitial pneumonitis ([place]) 09/23/2014  . UNSPEC ALVEOLAR&PARIETOALVEOLAR PNEUMONOPATHY 09/12/2008  . HYPERTENSION 09/11/2008  . BRONCHITIS 09/11/2008  PCP:  Aretta Nip, MD Pharmacy:   [facility] [place], [address] [place] [address] Phone: [phone] Fax: [phone]     Social Determinants of Health (SDOH) Interventions    Readmission Risk Interventions No flowsheet data found.

## 2019-09-21 DIAGNOSIS — J9621 Acute and chronic respiratory failure with hypoxia: Secondary | ICD-10-CM | POA: Diagnosis not present

## 2019-09-21 LAB — GLUCOSE, RANDOM: Glucose, Bld: 423 mg/dL — ABNORMAL HIGH (ref 70–99)

## 2019-09-21 LAB — GLUCOSE, CAPILLARY
Glucose-Capillary: 123 mg/dL — ABNORMAL HIGH (ref 70–99)
Glucose-Capillary: 227 mg/dL — ABNORMAL HIGH (ref 70–99)
Glucose-Capillary: 350 mg/dL — ABNORMAL HIGH (ref 70–99)
Glucose-Capillary: 468 mg/dL — ABNORMAL HIGH (ref 70–99)

## 2019-09-21 MED ORDER — INSULIN GLARGINE 100 UNIT/ML ~~LOC~~ SOLN
10.0000 [IU] | Freq: Every day | SUBCUTANEOUS | Status: DC
  Administered 2019-09-21: 10 [IU] via SUBCUTANEOUS
  Filled 2019-09-21 (×2): qty 0.1

## 2019-09-21 MED ORDER — AMIODARONE HCL 200 MG PO TABS
200.0000 mg | ORAL_TABLET | Freq: Every day | ORAL | Status: DC
  Administered 2019-09-22 – 2019-09-23 (×2): 200 mg via ORAL
  Filled 2019-09-21 (×2): qty 1

## 2019-09-21 MED ORDER — INSULIN ASPART 100 UNIT/ML ~~LOC~~ SOLN
10.0000 [IU] | Freq: Once | SUBCUTANEOUS | Status: AC
  Administered 2019-09-21: 10 [IU] via SUBCUTANEOUS

## 2019-09-21 NOTE — Progress Notes (Signed)
Name: Felicia [name] MRN: [id number] DOB: 07-01-1970    ADMISSION DATE:  09/05/2019 CONSULTATION DATE:  09/21/2019 REFERRING MD :  Delphia Grates, [facility] CHIEF COMPLAINT: Respiratory distress, worsening hypoxia  BRIEF HISTORY:  49 yo female with fibrotic NSIP and severe pulmonary hypertension on 10 to 15 liters oxygen presented with worsening hypoxia.    PAST MEDICAL HISTORY:  NSIP, Pulmonary Hypertension, Chronic hypoxic respiratory failure, DVT popliteal vein April 2021, PNA, Shingles  SIGNIFICANT EVENTS:  7/27 admit 7/28 PCCM consult 7/30 palliative care consult. Patient remains very steadfast in hoping for ongoing pulmonary improvement  8/02 slight decrease in O2 support  8/06 Afib with RVR and hypotension, txfr to ICU 8/13 breathing is a little bit more comfortable 8/14 breathing seems to be stable   CONSULTS:  Cardiology >> s/o 8/10 Palliative care  PROCEDURES:  Rt PICC 8/03 >>   SIGNIFICANT STUDIES:   Echocardiogram 04/2019 >> normal LV function, RVSP 64 , enlarged RV, bubble study negative.  PFTs 02/2017 >> FVC 1.25 L 36% predicted, FEV1 1.18 L 40% predicted with DLCO of 17   HRCT 02/2019 >> chronic fibrotic ILD progressed from 2018 with mild focus of honeycombing  Cath 3/ 2021 >> right atrial pressure was 7, PA pressure 60/24 with a mean of 39 and a wedge of 10 her cardiac index was low at 1.91 and output of 3.89. Her PVR is thus 7.  Echo 09/05/19 >> EF 50 to 55%, severe RV systolic dysfx, RVSP 81.4, mod/severe TR  CT chest 09/06/19 >> patchy GGO, interstitial coarsening and traction BTX with basilar predominance, no change compared to December 2018  MICRO:  7/28 BCx2 >> negative 7/28 UC>> multiple species   ANTIBIOTICS:  Cefepime 7/27-7/28 Vancomycin 7/27-7/28 Doxycycline 8/03-8/07  SUBJECTIVE:  No overnight events Breathing more comfortable OBJECTIVE:   VITAL SIGNS: Temp:  [97.6 F (36.4 C)-98.3 F (36.8 C)] 97.8 F (36.6 C) (08/14 0811) Pulse Rate:   [67-93] 81 (08/14 0811) Resp:  [13-35] 22 (08/14 0811) BP: (72-145)/(47-111) 103/60 (08/14 0811) SpO2:  [80 %-100 %] 100 % (08/14 0811) FiO2 (%):  [90 %-100 %] 100 % (08/14 0725) Weight:  [82.4 kg] 82.4 kg (08/14 0337)  Physical Exam:  General - alert, appears comfortable Eyes -pupils reactive ENT -moist oral mucosa Cardiac -S1-S2 appreciated Chest - basilar crackles Abdomen - soft, non tender, + bowel sounds Extremities - no cyanosis, clubbing, or edema  RESOLVED PROBLEMS:    ASSESSMENT/PLAN:   Acute on chronic hypoxic respiratory failure. -Pneumonitis in the setting of fibrotic NSIP -Continue oxygen supplementation -Saturation goals of 88 to 95% -She is currently on 100% high flow oxygen supplementation -Was able to take off nonrebreather mask, saturations 91-93  Severe pulmonary hypertension group 1,2,3 -Secondary to advanced pulmonary fibrosis -Continue lines of care -No other agent tolerated at present  Acute pneumonitis with history of fibrotic NSIP -On CellCept -On prednisone -Bactrim prophylaxis -Follows with [facility] Randol [facility] at [facility]  Atrial fibrillation with RVR  -Continue anticoagulation with Eliquis, digoxin, Toprol -Discontinue amiodarone at discharge-will be a poor long-term candidate  Group 1, 2, 3 pulmonary hypertension. - continue revatio - continue torsemide 60 mg bid for now - intolerant of tyvaso due to worsening cough symptoms - followed by Dr. [name] at [facility]  DM type 2 with steroid induced hyperglycemia. - having episodes of hypoglycemia with reduction in steroid dose -Lantus discontinued - hold metformin -  Hypokalemia. - replace potassium as needed  Hx of depression. - continue zoloft  Deconditioning. - PT/OT recommending home  health, 3 in 1 bedside commode, wheelchair with cushion  Constipation, anal fissure. - continue senokot, miralax - sitz bath - prn 2% lidocaine jelly  Pressure injury. - Lt buttock, present on  admission - wound care  Continue current lines of care Transfer to skilled nursing facility Patient had questions about possibly being able to go home with home nursing Oxygen requirement is still too high at present and will preclude being able to go home  BEST PRACTICE:  Nutrition: Regular diet DVT prophylaxis: Eliquis SUP: Protonix Mobility: PT/OT Goals of care: full code Disposition: Transfer to progressive care                     Goal is to go to skilled nursing facility                     Social work assisting with discharge plans LABS:   CMP Latest Ref Rng & Units 09/20/2019 09/19/2019 09/18/2019  Glucose 70 - 99 mg/dL 179(H) 51(L) 106(H)  BUN 6 - 20 mg/dL 27(H) 18 21(H)  Creatinine 0.44 - 1.00 mg/dL 0.88 0.77 0.66  Sodium 135 - 145 mmol/L 138 141 140  Potassium 3.5 - 5.1 mmol/L 3.9 3.7 3.4(L)  Chloride 98 - 111 mmol/L 91(L) 95(L) 97(L)  CO2 22 - 32 mmol/L 34(H) 35(H) 32  Calcium 8.9 - 10.3 mg/dL 9.4 9.1 8.7(L)  Total Protein 6.5 - 8.1 g/dL - - -  Total Bilirubin 0.3 - 1.2 mg/dL - - -  Alkaline Phos 38 - 126 U/L - - -  AST 15 - 41 U/L - - -  ALT 0 - 44 U/L - - -    CBC Latest Ref Rng & Units 09/20/2019 09/17/2019 09/12/2019  WBC 4.0 - 10.5 K/uL 14.8(H) 18.5(H) 18.7(H)  Hemoglobin 12.0 - 15.0 g/dL 12.3 12.5 13.4  Hematocrit 36 - 46 % 43.1 42.9 46.1(H)  Platelets 150 - 400 K/uL 185 209 220    ABG    Component Value Date/Time   PHART 7.497 (H) 08/21/2019 2224   PCO2ART 44.6 08/19/2019 2224   PO2ART 99 08/17/2019 2224   HCO3 34.6 (H) 08/12/2019 2224   TCO2 36 (H) 08/08/2019 2224   O2SAT 77.9 09/16/2019 1840    CBG (last 3)  Recent Labs    09/20/19 1639 09/20/19 2112 09/21/19 0734  GLUCAP 411* 304* 123*   Sherrilyn Rist, MD [place] [facility] Pager: [phone]

## 2019-09-21 NOTE — Progress Notes (Signed)
Felicia [name]  NWG:[id number] DOB: January 01, 1971 DOA: 08/25/2019 PCP: Aretta Nip, MD    Brief Narrative:  49 year old with a history of fibrotic NSIP and severe pulmonary hypertension on a baseline of 10-15 L oxygen support who presented to the ED 7/27 with worsening hypoxia.  Significant Events:  7/27 admit via [place] 7/29 TTE EF 50-55% with severe RV systolic dysfunction, RVSP 73, severe TR 7/30 palliative care consultation -patient desires ongoing aggressive care 8/3 PICC line placed 8/6 A. fib with RVR and hypotension -transferred to ICU 8/13 transfer to progressive care  Subjective: Blood pressure is stable and heart rate is controlled. Saturations are in the 90-100% range with use of heated high flow nasal cannula at 100% FiO2 and 25 L flow.  Patient is alert and conversant.  She states that she is feeling better overall but still nowhere near her baseline.  She is appreciably short of breath with attempts to carry on a conversation.  She is tolerating heated high flow nasal cannula very well otherwise.  Antimicrobials:  Cefepime 7/27-7/28 Vancomycin 7/27-7/28 Doxycycline 8/03-8/07  DVT prophylaxis: Eliquis  Assessment & Plan:  Acute on chronic severe hypoxic respiratory failure -acute pneumonitis with chronic fibrotic NSIP Due to acute pneumonitis -saturation goal 88% or greater -on CellCept and prednisone with Bactrim prophylaxis -followed by Dr. Randol Kern at [facility] -care as directed by pulmonary  Severe pulmonary hypertension group 1, 2, 3 Due to advanced pulmonary fibrosis -continue Revatio and torsemide -intolerant of Tyvaso with worsening cough -followed by Dr. [name] at [facility] -care as per pulmonary  Atrial fibrillation with acute RVR Continue anticoagulation with Eliquis -rate control via digoxin and Toprol -we will not use amiodarone long-term due to lung disease  DM2 uncontrolled with hyperglycemia and hypoglycemia Complicated by adjustments in steroid  dosing - A1c 10.2 -CBG presently stable  Depression Continue Zoloft  Deconditioning Goal is for SNF placement  Anal fissure with constipation Continue supportive care  Left buttock pressure injury present on admission Continue care per WOC  Code Status: FULL CODE Family Communication:  Status is: Inpatient  Remains inpatient appropriate because:Unsafe d/c plan   Dispo:  Patient From: Home  Planned Disposition: Home with Health Care Svc  Expected discharge date: 09/16/19  Medically stable for discharge: No  Consultants:  PCCM  Objective: Blood pressure 103/60, pulse 81, temperature 97.8 F (36.6 C), temperature source Oral, resp. rate (!) 22, height 5' 5" (1.651 m), weight 82.4 kg, SpO2 100 %.  Intake/Output Summary (Last 24 hours) at 09/21/2019 0919 Last data filed at 09/20/2019 1500 Gross per 24 hour  Intake 150 ml  Output 800 ml  Net -650 ml   Filed Weights   09/19/19 0500 09/20/19 0500 09/21/19 0337  Weight: 79.3 kg 78.8 kg 82.4 kg    Examination: General: Alert and conversant on high flow nasal cannula Lungs: Fine diffuse crackles with no wheezing Cardiovascular: Regular rate without murmur gallop or rub normal S1 and S2 Abdomen: Nontender, nondistended, soft, bowel sounds positive, no rebound, no ascites, no appreciable mass Extremities: No significant cyanosis, clubbing, or edema bilateral lower extremities  CBC: Recent Labs  Lab 09/17/19 0407 09/20/19 0521  WBC 18.5* 14.8*  HGB 12.5 12.3  HCT 42.9 43.1  MCV 84.6 84.8  PLT 209 578   Basic Metabolic Panel: Recent Labs  Lab 09/18/19 0440 09/19/19 0545 09/20/19 0521  NA 140 141 138  K 3.4* 3.7 3.9  CL 97* 95* 91*  CO2 32 35* 34*  GLUCOSE 106* 51* 179*  BUN 21* 18 27*  CREATININE 0.66 0.77 0.88  CALCIUM 8.7* 9.1 9.4  MG  --  1.8  --    GFR: Estimated Creatinine Clearance: 82 mL/min (by C-G formula based on SCr of 0.88 mg/dL).  Liver Function Tests: No results for input(s): AST, ALT,  ALKPHOS, BILITOT, PROT, ALBUMIN in the last 168 hours. No results for input(s): LIPASE, AMYLASE in the last 168 hours. No results for input(s): AMMONIA in the last 168 hours.  Coagulation Profile: No results for input(s): INR, PROTIME in the last 168 hours.  Cardiac Enzymes: No results for input(s): CKTOTAL, CKMB, CKMBINDEX, TROPONINI in the last 168 hours.  HbA1C: Hgb A1c MFr Bld  Date/Time Value Ref Range Status  09/19/2019 11:21 AM 10.2 (H) 4.8 - 5.6 % Final    Comment:    (NOTE) Pre diabetes:          5.7%-6.4%  Diabetes:              >6.4%  Glycemic control for   <7.0% adults with diabetes   09/17/2019 04:07 AM 10.3 (H) 4.8 - 5.6 % Final    Comment:    (NOTE) Pre diabetes:          5.7%-6.4%  Diabetes:              >6.4%  Glycemic control for   <7.0% adults with diabetes     CBG: Recent Labs  Lab 09/20/19 0804 09/20/19 1205 09/20/19 1639 09/20/19 2112 09/21/19 0734  GLUCAP 165* 256* 411* 304* 123*    No results found for this or any previous visit (from the past 240 hour(s)).   Scheduled Meds: . amiodarone  200 mg Oral BID  . apixaban  5 mg Oral BID  . calcium citrate  200 mg of elemental calcium Oral Daily  . Chlorhexidine Gluconate Cloth  6 each Topical Daily  . cholecalciferol  1,000 Units Oral Daily  . digoxin  0.125 mg Oral Daily  . escitalopram  10 mg Oral Daily  . insulin aspart  0-5 Units Subcutaneous QHS  . insulin aspart  0-9 Units Subcutaneous TID WC  . insulin aspart  2 Units Subcutaneous Q24H  . mouth rinse  15 mL Mouth Rinse BID  . metoprolol succinate  25 mg Oral BID  . midodrine  5 mg Oral TID WC  . multivitamin with minerals  1 tablet Oral Daily  . mycophenolate  1,500 mg Oral BID  . oxymetazoline  1 spray Each Nare BID  . pantoprazole  40 mg Oral Daily  . polyethylene glycol  17 g Oral BID  . potassium chloride  40 mEq Oral BID  . predniSONE  40 mg Oral Q breakfast  . Ensure Max Protein  11 oz Oral BID  . senna-docusate  2  tablet Oral BID  . sildenafil  40 mg Oral TID  . sulfamethoxazole-trimethoprim  1 tablet Oral Once per day on Mon Wed Fri  . torsemide  60 mg Oral BID     LOS: 18 days   Cherene Altes, MD [facility] Office  [phone] Pager - Text Page per Shea Evans  If 7PM-7AM, please contact night-coverage per Amion 09/21/2019, 9:19 AM

## 2019-09-22 DIAGNOSIS — J9621 Acute and chronic respiratory failure with hypoxia: Secondary | ICD-10-CM | POA: Diagnosis not present

## 2019-09-22 LAB — GLUCOSE, CAPILLARY
Glucose-Capillary: 116 mg/dL — ABNORMAL HIGH (ref 70–99)
Glucose-Capillary: 124 mg/dL — ABNORMAL HIGH (ref 70–99)
Glucose-Capillary: 252 mg/dL — ABNORMAL HIGH (ref 70–99)
Glucose-Capillary: 268 mg/dL — ABNORMAL HIGH (ref 70–99)

## 2019-09-22 MED ORDER — INSULIN ASPART 100 UNIT/ML ~~LOC~~ SOLN
8.0000 [IU] | Freq: Three times a day (TID) | SUBCUTANEOUS | Status: DC
  Administered 2019-09-22 – 2019-09-24 (×7): 8 [IU] via SUBCUTANEOUS

## 2019-09-22 MED ORDER — GERHARDT'S BUTT CREAM
TOPICAL_CREAM | Freq: Two times a day (BID) | CUTANEOUS | Status: DC | PRN
  Filled 2019-09-22: qty 1

## 2019-09-22 MED ORDER — INSULIN ASPART 100 UNIT/ML ~~LOC~~ SOLN: 6.0000 [IU] | Freq: Three times a day (TID) | SUBCUTANEOUS | Status: DC

## 2019-09-22 MED ORDER — INSULIN GLARGINE 100 UNIT/ML ~~LOC~~ SOLN: 20.0000 [IU] | Freq: Every day | SUBCUTANEOUS | Status: DC

## 2019-09-22 MED ORDER — INSULIN GLARGINE 100 UNIT/ML ~~LOC~~ SOLN
20.0000 [IU] | Freq: Two times a day (BID) | SUBCUTANEOUS | Status: DC
  Filled 2019-09-22 (×2): qty 0.2

## 2019-09-22 MED ORDER — INSULIN GLARGINE 100 UNIT/ML ~~LOC~~ SOLN
10.0000 [IU] | Freq: Every day | SUBCUTANEOUS | Status: DC
  Administered 2019-09-22 – 2019-09-23 (×2): 10 [IU] via SUBCUTANEOUS
  Filled 2019-09-22 (×4): qty 0.1

## 2019-09-22 MED ORDER — NYSTATIN 100000 UNIT/ML MT SUSP
5.0000 mL | Freq: Four times a day (QID) | OROMUCOSAL | Status: DC
  Administered 2019-09-22 – 2019-09-30 (×29): 500000 [IU] via ORAL
  Filled 2019-09-22 (×25): qty 5

## 2019-09-22 MED ORDER — TETANUS-DIPHTH-ACELL PERTUSSIS 5-2.5-18.5 LF-MCG/0.5 IM SUSP
0.5000 mL | Freq: Once | INTRAMUSCULAR | Status: AC
  Administered 2019-09-23: 0.5 mL via INTRAMUSCULAR
  Filled 2019-09-22 (×2): qty 0.5

## 2019-09-22 MED ORDER — INSULIN GLARGINE 100 UNIT/ML ~~LOC~~ SOLN
30.0000 [IU] | Freq: Every day | SUBCUTANEOUS | Status: DC
  Administered 2019-09-23 – 2019-09-24 (×2): 30 [IU] via SUBCUTANEOUS
  Filled 2019-09-22 (×5): qty 0.3

## 2019-09-22 NOTE — Progress Notes (Signed)
Felicia [name]  ALP:[id number] DOB: January 11, 1971 DOA: 08/12/2019 PCP: Aretta Nip, MD    Brief Narrative:  49 year old with a history of fibrotic NSIP and severe pulmonary hypertension on a baseline of 10-15 L oxygen support (used 2 oxygen concentrators concurrently at home) who presented to the ED 7/27 with worsening hypoxia.  Significant Events:  7/27 admit via  7/29 TTE EF 50-55% with severe RV systolic dysfunction, RVSP 73, severe TR 7/30 palliative care consultation -patient desires ongoing aggressive care 8/3 PICC line placed 8/6 A. fib with RVR and hypotension -transferred to ICU 8/13 transfer to progressive care  Subjective: Vital signs appear stable with ongoing use of heated high flow nasal cannula support at 100% FiO2 and now 15 L of flow, down from 25.  No new complaints today.  She tells me she is well past due for a tetanus booster and I have agreed to order this while she is here.  Antimicrobials:  Cefepime 7/27-7/28 Vancomycin 7/27-7/28 Doxycycline 8/03-8/07  DVT prophylaxis: Eliquis  Assessment & Plan:  Acute on chronic severe hypoxic respiratory failure -acute pneumonitis with chronic fibrotic NSIP Due to acute pneumonitis - saturation goal 88% or greater -on CellCept and prednisone with Bactrim prophylaxis -followed by Dr. Randol Kern at [facility] -care as directed by Pulmonary  Severe pulmonary hypertension group 1, 2, 3 Due to advanced pulmonary fibrosis - continue Revatio and torsemide - intolerant of Tyvaso with worsening cough - followed by Dr. [name] at [facility] - care as per Pulmonary  Atrial fibrillation with acute RVR Continue anticoagulation with Eliquis -rate control via digoxin and Toprol -we will not use amiodarone long-term due to lung disease - dose tapered 8/14  DM2 uncontrolled with hyperglycemia and hypoglycemia Complicated by adjustments in steroid dosing - A1c 10.2 - CBG quite variable with good control being accomplished first thing  in the morning but very high levels noted later in the day - adjust a.m. insulin dose and follow  Depression Continue Zoloft  Deconditioning Goal is for SNF placement but this will be difficult given high O2 demands - ?LTACH  Anal fissure with constipation Continue supportive care - sitz baths prn  Left buttock pressure injury present on admission Continue care per WOC  Code Status: FULL CODE Family Communication:  Status is: Inpatient  Remains inpatient appropriate because:Unsafe d/c plan   Dispo:  Patient From: Home  Planned Disposition: Home with Health Care Svc  Expected discharge date: 09/16/19  Medically stable for discharge: No  Consultants:  PCCM  Objective: Blood pressure (!) 133/104, pulse (!) 135, temperature (!) 96.9 F (36.1 C), resp. rate 20, height _0  (1.651 m), weight 82.4 kg, SpO2 100 %.  Intake/Output Summary (Last 24 hours) at 09/22/2019 0855 Last data filed at 09/22/2019 0748 Gross per 24 hour  Intake 360 ml  Output 1750 ml  Net -1390 ml   Filed Weights   09/20/19 0500 09/21/19 0337 09/22/19 0411  Weight: 78.8 kg 82.4 kg 82.4 kg    Examination: General: Alert and conversant on high flow nasal cannula Lungs: Fine diffuse crackles w/o change  Cardiovascular: RRR w/o M  Abdomen: Nontender, nondistended, soft, bowel sounds positive, no rebound, no ascites, no appreciable mass Extremities: No C/C/E B LE   CBC: Recent Labs  Lab 09/17/19 0407 09/20/19 0521  WBC 18.5* 14.8*  HGB 12.5 12.3  HCT 42.9 43.1  MCV 84.6 84.8  PLT 209 353   Basic Metabolic Panel: Recent Labs  Lab 09/18/19 0440 09/18/19 0440 09/19/19 0545 09/20/19  3748 09/21/19 2233  NA 140  --  141 138  --   K 3.4*  --  3.7 3.9  --   CL 97*  --  95* 91*  --   CO2 32  --  35* 34*  --   GLUCOSE 106*   < > 51* 179* 423*  BUN 21*  --  18 27*  --   CREATININE 0.66  --  0.77 0.88  --   CALCIUM 8.7*  --  9.1 9.4  --   MG  --   --  1.8  --   --    < > = values in this  interval not displayed.   GFR: Estimated Creatinine Clearance: 82 mL/min (by C-G formula based on SCr of 0.88 mg/dL).  Liver Function Tests: No results for input(s): AST, ALT, ALKPHOS, BILITOT, PROT, ALBUMIN in the last 168 hours. No results for input(s): LIPASE, AMYLASE in the last 168 hours. No results for input(s): AMMONIA in the last 168 hours.  Coagulation Profile: No results for input(s): INR, PROTIME in the last 168 hours.  Cardiac Enzymes: No results for input(s): CKTOTAL, CKMB, CKMBINDEX, TROPONINI in the last 168 hours.  HbA1C: Hgb A1c MFr Bld  Date/Time Value Ref Range Status  09/19/2019 11:21 AM 10.2 (H) 4.8 - 5.6 % Final    Comment:    (NOTE) Pre diabetes:          5.7%-6.4%  Diabetes:              >6.4%  Glycemic control for   <7.0% adults with diabetes   09/17/2019 04:07 AM 10.3 (H) 4.8 - 5.6 % Final    Comment:    (NOTE) Pre diabetes:          5.7%-6.4%  Diabetes:              >6.4%  Glycemic control for   <7.0% adults with diabetes     CBG: Recent Labs  Lab 09/21/19 0734 09/21/19 1240 09/21/19 1616 09/21/19 2127 09/22/19 0843  GLUCAP 123* 227* 350* 468* 116*    No results found for this or any previous visit (from the past 240 hour(s)).   Scheduled Meds:  amiodarone  200 mg Oral Daily   apixaban  5 mg Oral BID   calcium citrate  200 mg of elemental calcium Oral Daily   Chlorhexidine Gluconate Cloth  6 each Topical Daily   cholecalciferol  1,000 Units Oral Daily   digoxin  0.125 mg Oral Daily   escitalopram  10 mg Oral Daily   insulin aspart  0-5 Units Subcutaneous QHS   insulin aspart  0-9 Units Subcutaneous TID WC   insulin aspart  6 Units Subcutaneous TID WC   insulin glargine  20 Units Subcutaneous BID   mouth rinse  15 mL Mouth Rinse BID   metoprolol succinate  25 mg Oral BID   midodrine  5 mg Oral TID WC   multivitamin with minerals  1 tablet Oral Daily   mycophenolate  1,500 mg Oral BID   pantoprazole  40  mg Oral Daily   polyethylene glycol  17 g Oral BID   potassium chloride  40 mEq Oral BID   predniSONE  40 mg Oral Q breakfast   Ensure Max Protein  11 oz Oral BID   senna-docusate  2 tablet Oral BID   sildenafil  40 mg Oral TID   sulfamethoxazole-trimethoprim  1 tablet Oral Once per day on Mon Wed Fri  torsemide  60 mg Oral BID     LOS: 19 days   Cherene Altes, MD [facility] Office  [phone] Pager - Text Page per Amion  If 7PM-7AM, please contact night-coverage per Amion 09/22/2019, 8:55 AM

## 2019-09-22 NOTE — Progress Notes (Signed)
Name: Felicia [name] MRN: [id number] DOB: 1970/10/30    ADMISSION DATE:  09/06/2019 CONSULTATION DATE:  09/22/2019 REFERRING MD :  Delphia Grates, [facility] CHIEF COMPLAINT: Respiratory distress, worsening hypoxia  BRIEF HISTORY:  49 yo female with fibrotic NSIP and severe pulmonary hypertension on 10 to 15 liters oxygen presented with worsening hypoxia.    PAST MEDICAL HISTORY:  NSIP, Pulmonary Hypertension, Chronic hypoxic respiratory failure, DVT popliteal vein April 2021, PNA, Shingles  SIGNIFICANT EVENTS:  7/27 admit 7/28 PCCM consult 7/30 palliative care consult. Patient remains very steadfast in hoping for ongoing pulmonary improvement  8/02 slight decrease in O2 support  8/06 Afib with RVR and hypotension, txfr to ICU 8/13 breathing is a little bit more comfortable 8/14 breathing seems to be stable  CONSULTS:  Cardiology >> s/o 8/10 Palliative care  PROCEDURES:  Rt PICC 8/03 >>   SIGNIFICANT STUDIES:   Echocardiogram 04/2019 >> normal LV function, RVSP 64 , enlarged RV, bubble study negative.  PFTs 02/2017 >> FVC 1.25 L 36% predicted, FEV1 1.18 L 40% predicted with DLCO of 17   HRCT 02/2019 >> chronic fibrotic ILD progressed from 2018 with mild focus of honeycombing  Cath 3/ 2021 >> right atrial pressure was 7, PA pressure 60/24 with a mean of 39 and a wedge of 10 her cardiac index was low at 1.91 and output of 3.89. Her PVR is thus 7.  Echo 09/05/19 >> EF 50 to 55%, severe RV systolic dysfx, RVSP 12.8, mod/severe TR  CT chest 09/06/19 >> patchy GGO, interstitial coarsening and traction BTX with basilar predominance, no change compared to December 2018  MICRO:  7/28 BCx2 >> negative 7/28 UC>> multiple species   ANTIBIOTICS:  Cefepime 7/27-7/28 Vancomycin 7/27-7/28 Doxycycline 8/03-8/07  SUBJECTIVE:  No overnight events Breathing more comfortable Offers no new complaints OBJECTIVE:   VITAL SIGNS: Temp:  [96.9 F (36.1 C)-98.3 F (36.8 C)] 98.2 F (36.8 C)  (08/15 0911) Pulse Rate:  [79-135] 90 (08/15 0911) Resp:  [15-23] 18 (08/15 0911) BP: (96-133)/(62-104) 133/104 (08/15 0851) SpO2:  [95 %-100 %] 95 % (08/15 0911) FiO2 (%):  [100 %] 100 % (08/15 0755) Weight:  [82.4 kg] 82.4 kg (08/15 0411)  Physical Exam:  General - alert, appears comfortable, Eyes -pupils reactive ENT -moist oral mucosa Cardiac -S1-S2 appreciated Chest - basilar crackles, no use of accessory muscles of respiration Abdomen - soft, non tender, + bowel sounds Extremities - no cyanosis, clubbing, or edema  RESOLVED PROBLEMS:    ASSESSMENT/PLAN:   Acute on chronic hypoxic respiratory failure. -Pneumonitis in the setting of fibrotic NSIP -Continue oxygen supplementation-on 10 to 15 L at home -Saturation goals of 88 to 95%-able to maintain this on high flow oxygen at present -She was on a nonrebreather mask along with high flow oxygen this morning again, encouraged to take off nonrebreather mask  Severe pulmonary hypertension group 1,2,3 -Secondary to advanced pulmonary fibrosis -Continue lines of care -No other agent tolerated at present -No changes  Acute pneumonitis with history of fibrotic NSIP -On CellCept -On prednisone -Bactrim prophylaxis -Follows with [facility]. Randol Kern at [facility]  Atrial fibrillation with RVR  -Continue anticoagulation with Eliquis, digoxin, Toprol -Discontinue amiodarone at discharge-will be a poor long-term candidate  Group 1, 2, 3 pulmonary hypertension. - continue revatio - continue torsemide 60 mg bid for now - intolerant of tyvaso due to worsening cough symptoms - followed by Dr. [name] at [facility]  DM type 2 with steroid induced hyperglycemia. - having episodes of hypoglycemia with reduction in  steroid dose -Lantus discontinued - hold metformin  Hx of depression. - continue zoloft  Deconditioning. - PT/OT recommending home health, 3 in 1 bedside commode, wheelchair with cushion  Constipation, anal fissure. -Continues to  be an issue - continue senokot, miralax - sitz bath - prn 2% lidocaine jelly  Pressure injury. - Lt buttock, present on admission - wound care  Continue current lines of care Transfer to skilled nursing facility Patient had questions about possibly being able to go home with home nursing Oxygen requirement is still too high at present and will preclude being able to go home Questions about tetanus shot Regarding getting a booster dose of Covid vaccine-yes she will be a candidate  BEST PRACTICE:  Nutrition: Regular diet DVT prophylaxis: Eliquis SUP: Protonix Mobility: PT/OT Goals of care: full code Disposition: Transfer to progressive care                     Goal is to go to skilled nursing facility                     Social work assisting with discharge plans LABS:   CMP Latest Ref Rng & Units 09/21/2019 09/20/2019 09/19/2019  Glucose 70 - 99 mg/dL 423(H) 179(H) 51(L)  BUN 6 - 20 mg/dL - 27(H) 18  Creatinine 0.44 - 1.00 mg/dL - 0.88 0.77  Sodium 135 - 145 mmol/L - 138 141  Potassium 3.5 - 5.1 mmol/L - 3.9 3.7  Chloride 98 - 111 mmol/L - 91(L) 95(L)  CO2 22 - 32 mmol/L - 34(H) 35(H)  Calcium 8.9 - 10.3 mg/dL - 9.4 9.1  Total Protein 6.5 - 8.1 g/dL - - -  Total Bilirubin 0.3 - 1.2 mg/dL - - -  Alkaline Phos 38 - 126 U/L - - -  AST 15 - 41 U/L - - -  ALT 0 - 44 U/L - - -    CBC Latest Ref Rng & Units 09/20/2019 09/17/2019 09/12/2019  WBC 4.0 - 10.5 K/uL 14.8(H) 18.5(H) 18.7(H)  Hemoglobin 12.0 - 15.0 g/dL 12.3 12.5 13.4  Hematocrit 36 - 46 % 43.1 42.9 46.1(H)  Platelets 150 - 400 K/uL 185 209 220    ABG    Component Value Date/Time   PHART 7.497 (H) 09/05/2019 2224   PCO2ART 44.6 08/17/2019 2224   PO2ART 99 08/26/2019 2224   HCO3 34.6 (H) 08/11/2019 2224   TCO2 36 (H) 08/28/2019 2224   O2SAT 77.9 09/16/2019 1840    CBG (last 3)  Recent Labs    09/21/19 1616 09/21/19 2127 09/22/19 0843  GLUCAP 350* 468* 116*   Sherrilyn Rist, MD [place] [facility] Pager:  [phone]

## 2019-09-23 DIAGNOSIS — J9621 Acute and chronic respiratory failure with hypoxia: Secondary | ICD-10-CM | POA: Diagnosis not present

## 2019-09-23 DIAGNOSIS — J849 Interstitial pulmonary disease, unspecified: Secondary | ICD-10-CM | POA: Diagnosis not present

## 2019-09-23 LAB — GLUCOSE, CAPILLARY
Glucose-Capillary: 117 mg/dL — ABNORMAL HIGH (ref 70–99)
Glucose-Capillary: 208 mg/dL — ABNORMAL HIGH (ref 70–99)
Glucose-Capillary: 263 mg/dL — ABNORMAL HIGH (ref 70–99)
Glucose-Capillary: 266 mg/dL — ABNORMAL HIGH (ref 70–99)

## 2019-09-23 MED ORDER — PREDNISONE 20 MG PO TABS
30.0000 mg | ORAL_TABLET | Freq: Every day | ORAL | Status: DC
  Administered 2019-09-24 – 2019-09-29 (×6): 30 mg via ORAL
  Filled 2019-09-23 (×6): qty 1

## 2019-09-23 NOTE — Progress Notes (Signed)
Name: Felicia [name] MRN: [id number] DOB: Feb 26, 1970    ADMISSION DATE:  08/30/2019 CONSULTATION DATE:  09/23/2019 REFERRING MD :  Delphia Grates, [facility] CHIEF COMPLAINT: Respiratory distress, worsening hypoxia  BRIEF HISTORY:  49 yo female with fibrotic NSIP and severe pulmonary hypertension on 10 to 15 liters oxygen presented with worsening hypoxia.    PAST MEDICAL HISTORY:  NSIP, Pulmonary Hypertension, Chronic hypoxic respiratory failure, DVT popliteal vein April 2021, PNA, Shingles  SIGNIFICANT EVENTS:  7/27 admit 7/28 PCCM consult 7/30 palliative care consult. Patient remains very steadfast in hoping for ongoing pulmonary improvement  8/02 slight decrease in O2 support  8/06 Afib with RVR and hypotension, txfr to ICU 8/13 breathing is a little bit more comfortable 8/14 breathing seems to be stable  CONSULTS:  Cardiology >> s/o 8/10 Palliative care  PROCEDURES:  Rt PICC 8/03 >>   SIGNIFICANT STUDIES:   Echocardiogram 04/2019 >> normal LV function, RVSP 64 , enlarged RV, bubble study negative.  PFTs 02/2017 >> FVC 1.25 L 36% predicted, FEV1 1.18 L 40% predicted with DLCO of 17   HRCT 02/2019 >> chronic fibrotic ILD progressed from 2018 with mild focus of honeycombing  Cath 3/ 2021 >> right atrial pressure was 7, PA pressure 60/24 with a mean of 39 and a wedge of 10 her cardiac index was low at 1.91 and output of 3.89. Her PVR is thus 7.  Echo 09/05/19 >> EF 50 to 55%, severe RV systolic dysfx, RVSP 33.2, mod/severe TR  CT chest 09/06/19 >> patchy GGO, interstitial coarsening and traction BTX with basilar predominance, no change compared to December 2018  MICRO:  7/28 BCx2 >> negative 7/28 UC>> multiple species   ANTIBIOTICS:  Cefepime 7/27-7/28 Vancomycin 7/27-7/28 Doxycycline 8/03-8/07  SUBJECTIVE:   Patient still requiring high oxygen delivery.  Overall she states she feels some better.  She has questions about her steroid taper.  OBJECTIVE:   VITAL  SIGNS: Temp:  [97.6 F (36.4 C)-98.5 F (36.9 C)] 97.9 F (36.6 C) (08/16 0858) Pulse Rate:  [75-98] 78 (08/16 0858) Resp:  [17-28] 28 (08/16 0858) BP: (87-120)/(58-78) 87/62 (08/16 0858) SpO2:  [90 %-96 %] 95 % (08/16 0858) FiO2 (%):  [95 %-100 %] 100 % (08/16 0734) Weight:  [87 kg] 87 kg (08/16 0423)  Physical Exam:  General -obese female resting in bed, tachypneic, remains on heated high flow nasal cannula and nonrebreather Eyes -pupils reactive, tracking appropriately ENT -moist mucosa no lip cracking Cardiac -regular rate rhythm S1-S2 Chest -inspiratory crackles in the bases bilaterally no accessory muscle use Abdomen -obese pannus soft nontender nondistended Extremities -no significant cyanosis no edema  RESOLVED PROBLEMS:    ASSESSMENT/PLAN:   Acute on chronic hypoxic respiratory failure. Severe pulmonary hypertension group 1,2,3 Patient with a slow recovery from exacerbation of underlying ILD and decompensated pulmonary hypertension.  Patient follows at [facility] as an outpatient. -Continue on home dose CellCept -Continue Bactrim prophylaxis -Drop oral prednisone to 30 mg daily -Continue to wean FiO2 as tolerated to maintain goal O2 sats above 85%.  Atrial fibrillation with RVR  -Continue anticoagulation with Eliquis, digoxin plus Toprol for rate control. -Amiodarone has been stopped I agree she is a poor long-term candidate for this.  Group 1, 2, 3 pulmonary hypertension. -Continue sildenafil and torsemide for diuresis. -Patient is intolerant of Tyvaso in the past. -Euvolemia is the best treatment option at this time.  DM type 2 with steroid induced hyperglycemia. CBGs with SSI, per primary  Hx of depression. -Continue Zoloft  Deconditioning. -  She needs to be more aggressive with her mobility. I counseled the patient on this today.  Constipation, anal fissure. Bowel regimen per primary  Pressure injury. Wound care per primary  Plan and recommendations  were discussed with Dr. Thereasa Solo via phone.  We appreciate the consultation.  Do not hesitate to call.  We will follow along with you periodically throughout the week.   BEST PRACTICE:  Nutrition: Regular diet DVT prophylaxis: Eliquis SUP: Protonix Mobility: PT/OT Goals of care: full code Disposition: Progressive care, per primary  LABS:   CMP Latest Ref Rng & Units 09/21/2019 09/20/2019 09/19/2019  Glucose 70 - 99 mg/dL 423(H) 179(H) 51(L)  BUN 6 - 20 mg/dL - 27(H) 18  Creatinine 0.44 - 1.00 mg/dL - 0.88 0.77  Sodium 135 - 145 mmol/L - 138 141  Potassium 3.5 - 5.1 mmol/L - 3.9 3.7  Chloride 98 - 111 mmol/L - 91(L) 95(L)  CO2 22 - 32 mmol/L - 34(H) 35(H)  Calcium 8.9 - 10.3 mg/dL - 9.4 9.1  Total Protein 6.5 - 8.1 g/dL - - -  Total Bilirubin 0.3 - 1.2 mg/dL - - -  Alkaline Phos 38 - 126 U/L - - -  AST 15 - 41 U/L - - -  ALT 0 - 44 U/L - - -    CBC Latest Ref Rng & Units 09/20/2019 09/17/2019 09/12/2019  WBC 4.0 - 10.5 K/uL 14.8(H) 18.5(H) 18.7(H)  Hemoglobin 12.0 - 15.0 g/dL 12.3 12.5 13.4  Hematocrit 36 - 46 % 43.1 42.9 46.1(H)  Platelets 150 - 400 K/uL 185 209 220    ABG    Component Value Date/Time   PHART 7.497 (H) 08/09/2019 2224   PCO2ART 44.6 08/08/2019 2224   PO2ART 99 08/13/2019 2224   HCO3 34.6 (H) 08/16/2019 2224   TCO2 36 (H) 08/24/2019 2224   O2SAT 77.9 09/16/2019 1840    CBG (last 3)  Recent Labs    09/22/19 1622 09/22/19 2121 09/23/19 0751  GLUCAP 252* 268* 117*     Garner Nash, DO [facility] 09/23/2019 9:10 AM

## 2019-09-23 NOTE — Progress Notes (Signed)
Inpatient Diabetes Program Recommendations  AACE/ADA: New Consensus Statement on Inpatient Glycemic Control   Target Ranges:  Prepandial:   less than 140 mg/dL      Peak postprandial:   less than 180 mg/dL (1-2 hours)      Critically ill patients:  140 - 180 mg/dL   Results for BRIYA, LOOKABAUGH (MRN [id number]) as of 09/23/2019 10:03  Ref. Range 09/22/2019 08:43 09/22/2019 12:07 09/22/2019 16:22 09/22/2019 21:21 09/23/2019 07:51  Glucose-Capillary Latest Ref Range: 70 - 99 mg/dL 116 (H) 124 (H) 252 (H) 268 (H) 117 (H)   Review of Glycemic Control   Current orders for Inpatient glycemic control: Lantus 30 units QAM, Lantus 10 units QHS, Novolog 8 units TID with meals, Novolog 0-9 units TID with meals, Novolog 0-5 units QHS; Prednisone 40 mg QAM  Inpatient Diabetes Program Recommendations:    Insulin-If steroids are continued, please consider increasing meal coverage to Novolog 10 units TID with meals.  Thanks, Barnie Alderman, RN, MSN, CDE Diabetes Coordinator Inpatient Diabetes Program [phone] (Team Pager from 8am to 5pm)

## 2019-09-23 NOTE — Progress Notes (Signed)
Physical Therapy Treatment Patient Details Name: Felicia [name] MRN: [id number] DOB: October 13, 1970 Today's Date: 09/23/2019    History of Present Illness Patient is a 49 y/o female who presents with SOB, cough, weakness. 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 PMH includes nonspecific interstitial pneumonitis, DVT, DM, pulmonary HTN, diastolic HF, chronic hypoxic respiratory failure on 12-15L/min 02 [place]    PT Comments    Pt very fixated on going to the bathroom for stool today.  She was again interested in getting to the [facility].  Stood x3.  2 with face to face assist, 1 time with use of the RW.  Though she attained a decent submaximal upright stand, she did not have enough assist to maintain the stance with the RW. Pt able to take 2-3 pivotal steps during each pivot transfer, but not able to back up to the bed.  She was encourage to be ready to stay up in the chair next session.    Follow Up Recommendations  SNF;Supervision/Assistance - 24 hour     Equipment Recommendations  Other (comment) (TBD)    Recommendations for Other Services       Precautions / Restrictions Precautions Precautions: Fall Precaution Comments: watch 02, on 35L HF, hx of fall this admission, nonrebreather used as needed by pt    Mobility  Bed Mobility Overal bed mobility: Needs Assistance       Supine to sit: Mod assist;+2 for safety/equipment Sit to supine: +2 for safety/equipment;Max assist   General bed mobility comments: assist to progress trunk to upright posture and progress BLE to EOB, assist to progress hips to EOB as well  Transfers Overall transfer level: Needs assistance Equipment used:  (1 person face to face transfer) Transfers: Sit to/from [name] Sit to Stand: Max assist Stand pivot transfers: Max assist       General  transfer comment: maxA with face to face transfer and 1 trial with RW  to stand from EOB, maxA for stand-pivot transfer to [facility].  Ambulation/Gait             General Gait Details: pivot with face to face assist only   Stairs             Wheelchair Mobility    Modified Rankin (Stroke Patients Only)       Balance Overall balance assessment: Needs assistance Sitting-balance support: No upper extremity supported Sitting balance-[name]: Fair Sitting balance - Comments: minguard sitting EOB, decreased activity tolerance, pt able to sit unsupported   Standing balance support: Bilateral upper extremity supported Standing balance-[name]: Zero Standing balance comment: heavy reliance on BUE support and/or external assist                            Cognition Arousal/Alertness: Awake/alert Behavior During Therapy: WFL for tasks assessed/performed Overall Cognitive Status: Within Functional Limits for tasks assessed                                 General Comments: Convinced pt she needs to attempt to sit in the chair next session      Exercises      General Comments        Pertinent Vitals/Pain Pain Assessment: Faces Faces Pain Scale: Hurts little more Pain  Location: generalized  Pain Descriptors / Indicators: Grimacing;Guarding;Discomfort Pain Intervention(s): Monitored during session    Home Living                      Prior Function            PT Goals (current goals can now be found in the care plan section) Acute Rehab PT Goals Patient Stated Goal: to be able to breathe PT Goal Formulation: With patient Time For Goal Achievement: 10/07/19 Potential to Achieve Goals: Fair Progress towards PT goals: Progressing toward goals    Frequency    Min 3X/week      PT Plan Current plan remains appropriate    Co-evaluation PT/OT/SLP Co-Evaluation/Treatment: Yes Reason for Co-Treatment: To address  functional/ADL transfers;For patient/therapist safety PT goals addressed during session: Mobility/safety with mobility OT goals addressed during session: ADL's and self-care;Strengthening/ROM      AM-PAC PT "6 Clicks" Mobility   Outcome Measure  Help needed turning from your back to your side while in a flat bed without using bedrails?: A Little Help needed moving from lying on your back to sitting on the side of a flat bed without using bedrails?: A Lot Help needed moving to and from a bed to a chair (including a wheelchair)?: A Lot Help needed standing up from a chair using your arms (e.g., wheelchair or bedside chair)?: Total Help needed to walk in hospital room?: Total Help needed climbing 3-5 steps with a railing? : Total 6 Click Score: 10    End of Session   Activity Tolerance: Patient tolerated treatment well Patient left: in bed;with call bell/phone within reach;with bed alarm set Nurse Communication: Mobility status PT Visit Diagnosis: Other abnormalities of gait and mobility (R26.89);Muscle weakness (generalized) (M62.81)     Time: 4469-5072 PT Time Calculation (min) (ACUTE ONLY): 39 min  Charges:  $Therapeutic Activity: 23-37 mins                     09/23/2019  Ginger Carne., PT Acute Rehabilitation Services [phone]  (pager) [phone]  (office)   Tessie Fass Teryn Boerema 09/23/2019, 1:27 PM

## 2019-09-23 NOTE — Progress Notes (Signed)
Felicia [name]  IHW:[id number] DOB: [birth year] DOA: 08/31/2019 PCP: Aretta Nip, MD    Brief Narrative:  49 year old with a history of fibrotic NSIP and severe pulmonary hypertension on a baseline of 10-15 L oxygen support (used 2 oxygen concentrators concurrently at home) who presented to the ED 7/27 with worsening hypoxia.  Significant Events:  7/27 admit via [place] 7/29 TTE EF 50-55% with severe RV systolic dysfunction, RVSP 73, severe TR 7/30 palliative care consultation -patient desires ongoing aggressive care 8/3 PICC line placed 8/6 A. fib with RVR and hypotension -transferred to ICU 8/13 transfer to progressive care  Subjective: Continues to require heated high flow nasal cannula at 100% FiO2 and 25 L of flow.  Saturations are stable.  She is afebrile. Intermittently (especially w/ slight exertion) requiring addition of NRB on top of HHFNC).  Antimicrobials:  Cefepime 7/27-7/28 Vancomycin 7/27-7/28 Doxycycline 8/03-8/07  DVT prophylaxis: Eliquis  Assessment & Plan:  Acute on chronic severe hypoxic respiratory failure -acute pneumonitis with chronic fibrotic NSIP Due to acute pneumonitis - saturation goal 88% or greater -on CellCept and prednisone with Bactrim prophylaxis -followed by Dr. Randol Kern at [facility] -care as directed by Pulmonary -currently requiring too much oxygen support to allow discharge to any venue other than LTAC  Severe pulmonary hypertension group 1, 2, 3 Due to advanced pulmonary fibrosis - continue Revatio and torsemide - intolerant of Tyvaso with worsening cough - followed by Dr. [name] at [name] - care as per Pulmonary  Atrial fibrillation with acute RVR Continue anticoagulation with Eliquis -rate control via digoxin and Toprol -we will not use amiodarone long-term due to lung disease - dose tapered 8/14, and will go ahead and stop it today   DM2 uncontrolled with hyperglycemia and hypoglycemia Complicated by adjustments in steroid dosing -  A1c 10.2 -CBG improved with adjustments made in insulin yesterday -follow another 24 hours without changing  Depression Continue Zoloft  Deconditioning Goal is for SNF placement but this will be difficult given high O2 demands - ?LTACH  Anal fissure with constipation Continue supportive care - sitz baths prn  Left buttock pressure injury present on admission Continue care per WOC  Code Status: FULL CODE Family Communication:  Status is: Inpatient  Remains inpatient appropriate because:Unsafe d/c plan   Dispo:  Patient From: Home  Planned Disposition: [place]  Expected discharge date: 09/25/19  Medically stable for discharge: No  Consultants:  PCCM  Objective: Blood pressure (!) 87/62, pulse 78, temperature 97.9 F (36.6 C), temperature source Axillary, resp. rate (!) 28, height _0  (1.651 m), weight 87 kg, SpO2 95 %.  Intake/Output Summary (Last 24 hours) at 09/23/2019 0947 Last data filed at 09/23/2019 0847 Gross per 24 hour  Intake 860 ml  Output 1000 ml  Net -140 ml   Filed Weights   09/21/19 0337 09/22/19 0411 09/23/19 0423  Weight: 82.4 kg 82.4 kg 87 kg    Examination: General: Alert and conversant  Lungs: Fine diffuse crackles - no wheezing  Cardiovascular: RRR  Abdomen: NT/ND, soft, bs+, no mass  Extremities: No signif edema B LE    CBC: Recent Labs  Lab 09/17/19 0407 09/20/19 0521  WBC 18.5* 14.8*  HGB 12.5 12.3  HCT 42.9 43.1  MCV 84.6 84.8  PLT 209 491   Basic Metabolic Panel: Recent Labs  Lab 09/18/19 0440 09/18/19 0440 09/19/19 0545 09/20/19 0521 09/21/19 2233  NA 140  --  141 138  --   K 3.4*  --  3.7 3.9  --   CL 97*  --  95* 91*  --   CO2 32  --  35* 34*  --   GLUCOSE 106*   < > 51* 179* 423*  BUN 21*  --  18 27*  --   CREATININE 0.66  --  0.77 0.88  --   CALCIUM 8.7*  --  9.1 9.4  --   MG  --   --  1.8  --   --    < > = values in this interval not displayed.   GFR: Estimated Creatinine Clearance: 84.2  mL/min (by C-G formula based on SCr of 0.88 mg/dL).  Liver Function Tests: No results for input(s): AST, ALT, ALKPHOS, BILITOT, PROT, ALBUMIN in the last 168 hours. No results for input(s): LIPASE, AMYLASE in the last 168 hours. No results for input(s): AMMONIA in the last 168 hours.  Coagulation Profile: No results for input(s): INR, PROTIME in the last 168 hours.  Cardiac Enzymes: No results for input(s): CKTOTAL, CKMB, CKMBINDEX, TROPONINI in the last 168 hours.  HbA1C: Hgb A1c MFr Bld  Date/Time Value Ref Range Status  09/19/2019 11:21 AM 10.2 (H) 4.8 - 5.6 % Final    Comment:    (NOTE) Pre diabetes:          5.7%-6.4%  Diabetes:              >6.4%  Glycemic control for   <7.0% adults with diabetes   09/17/2019 04:07 AM 10.3 (H) 4.8 - 5.6 % Final    Comment:    (NOTE) Pre diabetes:          5.7%-6.4%  Diabetes:              >6.4%  Glycemic control for   <7.0% adults with diabetes     CBG: Recent Labs  Lab 09/22/19 0843 09/22/19 1207 09/22/19 1622 09/22/19 2121 09/23/19 0751  GLUCAP 116* 124* 252* 268* 117*    No results found for this or any previous visit (from the past 240 hour(s)).   Scheduled Meds: . amiodarone  200 mg Oral Daily  . apixaban  5 mg Oral BID  . calcium citrate  200 mg of elemental calcium Oral Daily  . Chlorhexidine Gluconate Cloth  6 each Topical Daily  . cholecalciferol  1,000 Units Oral Daily  . digoxin  0.125 mg Oral Daily  . escitalopram  10 mg Oral Daily  . insulin aspart  0-5 Units Subcutaneous QHS  . insulin aspart  0-9 Units Subcutaneous TID WC  . insulin aspart  8 Units Subcutaneous TID WC  . insulin glargine  10 Units Subcutaneous QHS  . insulin glargine  30 Units Subcutaneous Daily  . mouth rinse  15 mL Mouth Rinse BID  . metoprolol succinate  25 mg Oral BID  . midodrine  5 mg Oral TID WC  . multivitamin with minerals  1 tablet Oral Daily  . mycophenolate  1,500 mg Oral BID  . nystatin  5 mL Oral QID  .  pantoprazole  40 mg Oral Daily  . polyethylene glycol  17 g Oral BID  . potassium chloride  40 mEq Oral BID  . predniSONE  40 mg Oral Q breakfast  . Ensure Max Protein  11 oz Oral BID  . senna-docusate  2 tablet Oral BID  . sildenafil  40 mg Oral TID  . sulfamethoxazole-trimethoprim  1 tablet Oral Once per day on Mon Wed Fri  . Tdap  0.5 mL Intramuscular Once  . torsemide  60 mg Oral BID     LOS: 20 days   Cherene Altes, MD [facility] Office  [phone] Pager - Text Page per Amion  If 7PM-7AM, please contact night-coverage per Amion 09/23/2019, 9:47 AM

## 2019-09-23 NOTE — Progress Notes (Signed)
Occupational Therapy Treatment Patient Details Name: Felicia [name] MRN: [id number] DOB: [birth year] Today's Date: 09/23/2019    History of present illness Patient is a 49 y/o female who presents with SOB, cough, weakness. 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 PMH includes nonspecific interstitial pneumonitis, DVT, DM, pulmonary HTN, diastolic HF, chronic hypoxic respiratory failure on 12-15L/min 02 [name]   OT comments  Patient continues to make steady progress towards goals in skilled OT session. Patient's session encompassed co-treat with PT in order to address functional needs and progress in overall activity tolerance and functional mobility. Pt motivated to transfer to [name], however requires max encouragement in order to use her BUEs to push off from hospital bed to transfer. Pt attempted x1, however requested to hold onto PTs arms to transfer to [name]. Pt able to push off to initiate to stand x2 to transfer back to bed, however continues to require face to face transfer with PT in order to get back to bed. Spent a number of minutes explaining the importance of getting out of the bed to the chair to work on activity tolerance throughout the week, with pt with acceptance at the end of the session. Discharge remains appropriate; will continue to follow acutely.    Follow Up Recommendations  Supervision/Assistance - 24 hour;SNF    Equipment Recommendations  3 in 1 bedside commode;Wheelchair (measurements OT);Wheelchair cushion (measurements OT)    Recommendations for Other Services      Precautions / Restrictions Precautions Precautions: Fall Precaution Comments: watch 02, on 35L HF, hx of fall this admission, nonrebreather used as needed by pt       Mobility Bed Mobility Overal bed mobility: Needs Assistance       Supine to sit: Mod assist;+2  for safety/equipment Sit to supine: +2 for safety/equipment;Max assist   General bed mobility comments: assist to progress trunk to upright posture and progress BLE to EOB, assist to progress hips to EOB as well  Transfers Overall transfer level: Needs assistance Equipment used:  (1 person face to face transfer) Transfers: Sit to/from [name] Sit to Stand: Max assist Stand pivot transfers: Max assist       General transfer comment: maxA with face to face transfer to stand from EOB, maxA for stand-pivot transfer to [name].    Balance Overall balance assessment: Needs assistance Sitting-balance support: No upper extremity supported Sitting balance-[name]: Fair Sitting balance - Comments: minguard sitting EOB, decreased activity tolerance, pt able to sit unsupported   Standing balance support: Bilateral upper extremity supported Standing balance-[name]: Zero Standing balance comment: heavy reliance on BUE support                           ADL either performed or assessed with clinical judgement   ADL Overall ADL's : Needs assistance/impaired                         Toilet Transfer: Maximal assistance;Squat-pivot Toilet Transfer Details (indicate cue type and reason): from EOB to [name], sat on [name] for >36mn Toileting- Clothing Manipulation and Hygiene: Sit to/from stand;Total assistance;+2 for physical assistance;+2 for safety/equipment Toileting - Clothing Manipulation Details (indicate cue type and reason): for pericare while pt standing     Functional mobility during ADLs: Maximal assistance General ADL Comments: continues to  demonstrate decreased activity tolerance and generalized weakness      Vision       Perception     Praxis      Cognition Arousal/Alertness: Awake/alert Behavior During Therapy: WFL for tasks assessed/performed Overall Cognitive Status: Within Functional Limits for tasks assessed                                  General Comments: Convinced pt she needs to attempt to sit in the chair next session        Exercises     Shoulder Instructions       General Comments      Pertinent Vitals/ Pain       Pain Assessment: Faces Faces Pain Scale: Hurts little more Pain Location: generalized  Pain Descriptors / Indicators: Discomfort;Grimacing;Guarding Pain Intervention(s): Monitored during session  Home Living                                          Prior Functioning/Environment              Frequency  Min 2X/week        Progress Toward Goals  OT Goals(current goals can now be found in the care plan section)  Progress towards OT goals: Progressing toward goals  Acute Rehab OT Goals Patient Stated Goal: to be able to breathe OT Goal Formulation: With patient Time For Goal Achievement: 10/03/19 Potential to Achieve Goals: [name] Discharge plan needs to be updated    Co-evaluation        PT goals addressed during session: Mobility/safety with mobility OT goals addressed during session: ADL's and self-care;Strengthening/ROM      AM-PAC OT "6 Clicks" Daily Activity     Outcome Measure   Help from another person eating meals?: A Little Help from another person taking care of personal grooming?: A Little Help from another person toileting, which includes using toliet, bedpan, or urinal?: Total Help from another person bathing (including washing, rinsing, drying)?: A Lot Help from another person to put on and taking off regular upper body clothing?: A Lot Help from another person to put on and taking off regular lower body clothing?: Total 6 Click Score: 12    End of Session Equipment Utilized During Treatment: Oxygen  OT Visit Diagnosis: Muscle weakness (generalized) (M62.81);Other (comment)   Activity Tolerance Patient limited by fatigue   Patient Left in bed;with call bell/phone within reach;with bed alarm set    Nurse Communication Mobility status        Time: 8841-6606 OT Time Calculation (min): 39 min  Charges: OT General Charges $OT Visit: 1 Visit OT Treatments $Self Care/Home Management : 8-22 mins  Corinne Ports E. Pierce, [place] Acute Rehabilitation Services [place] 09/23/2019, 1:13 PM

## 2019-09-23 NOTE — TOC Progression Note (Addendum)
Transition of Care [facility]) - Progression Note    Patient Details  Name: Aletta Edmunds MRN: [id number] Date of Birth: Oct 25, 1970  Transition of Care [name]) CM/SW Contact  Joanne Chars, LCSW Phone Number: 09/23/2019, 2:10 PM  Clinical Narrative:   CSW met with pt to discuss bed offers at [place].  Pt reports she has changed her mind about going to SNF and would rather go home with [facility] at this time.  Pt reports her adult daughter lives with her and her mother lives in [place] but is also available to help.  Discussed equipment.  Pt currently has shower chair/commode, rollater, wheelchair, and walker already in the home. SNF choice was provided and CSW also provided [name] choice document.    1430: Pt asked to see CSW, stated she forgot to mention that [facility] is already seeing her currently and she would like to continue with them.  CSW spoke with [facility] and they confirmed that pt is active and they will continue upon discharge.     Expected Discharge Plan: [place] Barriers to Discharge: Continued Medical Work up  Expected Discharge Plan and Services Expected Discharge Plan: [place]: [place] arrangements for the past 2 months: Single Family Home                                       Social Determinants of Health (SDOH) Interventions    Readmission Risk Interventions No flowsheet data found.

## 2019-09-24 LAB — GLUCOSE, CAPILLARY
Glucose-Capillary: 92 mg/dL (ref 70–99)
Glucose-Capillary: 125 mg/dL — ABNORMAL HIGH (ref 70–99)
Glucose-Capillary: 119 mg/dL — ABNORMAL HIGH (ref 70–99)
Glucose-Capillary: 92 mg/dL (ref 70–99)

## 2019-09-24 NOTE — Progress Notes (Signed)
Nutrition Follow-up  DOCUMENTATION CODES:   Not applicable  INTERVENTION:   Continue Ensure Max po BID, each supplement provides 150 kcal and 30 grams of protein.   Continue to encourage intake of meals and supplements.   NUTRITION DIAGNOSIS:   Increased nutrient needs related to wound healing as evidenced by estimated needs.  Ongoing  GOAL:   Patient will meet greater than or equal to 90% of their needs  Progressing  MONITOR:   PO intake, Supplement acceptance, Weight trends, Labs, I & O's  REASON FOR ASSESSMENT:   LOS    ASSESSMENT:   Patient with PMH significant for nonspecific interstitial pneumonitis, DM, pulmonary HTN, CHF, and chronic 10-15 L/min supplemental O2 requirement. Presents this admission with acute on chronic respiratory failure.  Pt was transferred to ICU on 8/6 after Afib with RVR and hypotension. Pt's breathing stabilized and pt was transferred back to the floor on 8/13.   Pt's appetite is improving. Pt has consumed 50-100% of her last 8 recorded meals (82.5% average meal intake).   Pt has been doing better with oral nutrition supplement as well. Pt has only refused the supplement once since last RD assessment.   Labs: CBGs [phone] (Diabetes Coordinator following) Medications: Calcitrate, Vitamin D3, Novolog, Lantus, MVI, Protonix, Miralax, Klor-Con, Deltasone, Ensure Max BID, Senokot-S   Diet Order:   Diet Order            Diet regular Room service appropriate? Yes; Fluid consistency: Thin; Fluid restriction: 1500 mL Fluid  Diet effective now                 EDUCATION NEEDS:   Not appropriate for education at this time  Skin:  Skin Assessment: Skin Integrity Issues: Skin Integrity Issues:: Stage II Stage II: L buttocks  Last BM:  8/15 type 6  Height:   Ht Readings from Last 1 Encounters:  09/10/19 _0  (1.651 m)    Weight:   Wt Readings from Last 1 Encounters:  09/23/19 87 kg    BMI:  Body mass index is 31.92  kg/m.  Estimated Nutritional Needs:   Kcal:  2100-2300 kcal  Protein:  105-120 grams  Fluid:  >/= 2.1 L/day    Larkin Ina, MS, RD, LDN RD pager number and weekend/on-call pager number located in [place].

## 2019-09-24 NOTE — Progress Notes (Signed)
Felicia [name]  NGE:[id number] DOB: Apr 28, 1970 DOA: 08/31/2019 PCP: Aretta Nip, MD    Brief Narrative:  49 year old with a history of fibrotic NSIP and severe pulmonary hypertension on a baseline of 10-15 L oxygen support (used 2 oxygen concentrators concurrently at home) who presented to the ED 7/27 with worsening hypoxia.  Significant Events:  7/27 admit via [place] 7/29 TTE EF 50-55% with severe RV systolic dysfunction, RVSP 73, severe TR 7/30 palliative care consultation -patient desires ongoing aggressive care 8/3 PICC line placed 8/6 A. fib with RVR and hypotension -transferred to ICU 8/13 transfer to progressive care  Subjective: Continues to require heated high flow nasal cannula support.  Vital signs are otherwise stable.  No new complaints today.  Resting comfortably in bed despite high O2 requirement.  Antimicrobials:  Cefepime 7/27-7/28 Vancomycin 7/27-7/28 Doxycycline 8/03-8/07  DVT prophylaxis: Eliquis  Assessment & Plan:  Acute on chronic severe hypoxic respiratory failure -acute pneumonitis with chronic fibrotic NSIP Due to acute pneumonitis - saturation goal 88% or greater -on CellCept and prednisone with Bactrim prophylaxis -followed by Dr. Randol Kern at [facility] -care as directed by Pulmonary -currently requiring too much oxygen support to allow discharge to any venue other than LTAC  Severe pulmonary hypertension group 1, 2, 3 Due to advanced pulmonary fibrosis - continue Revatio and torsemide - intolerant of Tyvaso with worsening cough - followed by Dr. [name] at [facility] - care as per Pulmonary  Atrial fibrillation with acute RVR Continue anticoagulation with Eliquis -rate control via digoxin and Toprol -not felt to be appropriate for long-term use of amiodarone therefore this was discontinued  DM2 uncontrolled with hyperglycemia and hypoglycemia Complicated by adjustments in steroid dosing - A1c 10.2 -CBG improved with adjustments made in insulin  yesterday -follow another 24 hours without changing  Depression Continue Zoloft  Deconditioning Goal is for SNF placement but this will be difficult given high O2 demands - ?LTACH v/s home (but will face same difficulty meeting high O2 needs at home)  Anal fissure with constipation Continue supportive care - sitz baths prn  Left buttock pressure injury present on admission Continue care per WOC  Code Status: FULL CODE Family Communication:  Status is: Inpatient  Remains inpatient appropriate because:Unsafe d/c plan   Dispo:  Patient From: Home  Planned Disposition: [place]  Expected discharge date: 09/25/19  Medically stable for discharge: No  Consultants:  PCCM  Objective: Blood pressure 92/60, pulse 77, temperature 97.7 F (36.5 C), temperature source Axillary, resp. rate 19, height _0  (1.651 m), weight 87 kg, SpO2 96 %.  Intake/Output Summary (Last 24 hours) at 09/24/2019 0939 Last data filed at 09/24/2019 0852 Gross per 24 hour  Intake 120 ml  Output 450 ml  Net -330 ml   Filed Weights   09/21/19 0337 09/22/19 0411 09/23/19 0423  Weight: 82.4 kg 82.4 kg 87 kg    Examination: General: Alert and conversant  Lungs: Fine diffuse crackles  Cardiovascular: RRR  Extremities: No edema B LE    CBC: Recent Labs  Lab 09/20/19 0521  WBC 14.8*  HGB 12.3  HCT 43.1  MCV 84.8  PLT 401   Basic Metabolic Panel: Recent Labs  Lab 09/18/19 0440 09/18/19 0440 09/19/19 0545 09/20/19 0521 09/21/19 2233  NA 140  --  141 138  --   K 3.4*  --  3.7 3.9  --   CL 97*  --  95* 91*  --   CO2 32  --  35* 34*  --  GLUCOSE 106*   < > 51* 179* 423*  BUN 21*  --  18 27*  --   CREATININE 0.66  --  0.77 0.88  --   CALCIUM 8.7*  --  9.1 9.4  --   MG  --   --  1.8  --   --    < > = values in this interval not displayed.   GFR: Estimated Creatinine Clearance: 84.2 mL/min (by C-G formula based on SCr of 0.88 mg/dL).  Liver Function Tests: No results  for input(s): AST, ALT, ALKPHOS, BILITOT, PROT, ALBUMIN in the last 168 hours. No results for input(s): LIPASE, AMYLASE in the last 168 hours. No results for input(s): AMMONIA in the last 168 hours.  Coagulation Profile: No results for input(s): INR, PROTIME in the last 168 hours.  Cardiac Enzymes: No results for input(s): CKTOTAL, CKMB, CKMBINDEX, TROPONINI in the last 168 hours.  HbA1C: Hgb A1c MFr Bld  Date/Time Value Ref Range Status  09/19/2019 11:21 AM 10.2 (H) 4.8 - 5.6 % Final    Comment:    (NOTE) Pre diabetes:          5.7%-6.4%  Diabetes:              >6.4%  Glycemic control for   <7.0% adults with diabetes   09/17/2019 04:07 AM 10.3 (H) 4.8 - 5.6 % Final    Comment:    (NOTE) Pre diabetes:          5.7%-6.4%  Diabetes:              >6.4%  Glycemic control for   <7.0% adults with diabetes     CBG: Recent Labs  Lab 09/23/19 0751 09/23/19 1204 09/23/19 1633 09/23/19 2039 09/24/19 0754  GLUCAP 117* 208* 263* 266* 92     Scheduled Meds: . apixaban  5 mg Oral BID  . calcium citrate  200 mg of elemental calcium Oral Daily  . Chlorhexidine Gluconate Cloth  6 each Topical Daily  . cholecalciferol  1,000 Units Oral Daily  . digoxin  0.125 mg Oral Daily  . escitalopram  10 mg Oral Daily  . insulin aspart  0-5 Units Subcutaneous QHS  . insulin aspart  0-9 Units Subcutaneous TID WC  . insulin aspart  8 Units Subcutaneous TID WC  . insulin glargine  10 Units Subcutaneous QHS  . insulin glargine  30 Units Subcutaneous Daily  . mouth rinse  15 mL Mouth Rinse BID  . metoprolol succinate  25 mg Oral BID  . midodrine  5 mg Oral TID WC  . multivitamin with minerals  1 tablet Oral Daily  . mycophenolate  1,500 mg Oral BID  . nystatin  5 mL Oral QID  . pantoprazole  40 mg Oral Daily  . polyethylene glycol  17 g Oral BID  . potassium chloride  40 mEq Oral BID  . predniSONE  30 mg Oral Q breakfast  . Ensure Max Protein  11 oz Oral BID  . senna-docusate  2  tablet Oral BID  . sildenafil  40 mg Oral TID  . sulfamethoxazole-trimethoprim  1 tablet Oral Once per day on Mon Wed Fri     LOS: 21 days   Cherene Altes, MD [facility] Office  [phone] Pager - Text Page per Shea Evans  If 7PM-7AM, please contact night-coverage per Amion 09/24/2019, 9:39 AM

## 2019-09-25 DIAGNOSIS — J9621 Acute and chronic respiratory failure with hypoxia: Secondary | ICD-10-CM | POA: Diagnosis not present

## 2019-09-25 LAB — GLUCOSE, CAPILLARY
Glucose-Capillary: 57 mg/dL — ABNORMAL LOW (ref 70–99)
Glucose-Capillary: 107 mg/dL — ABNORMAL HIGH (ref 70–99)
Glucose-Capillary: 161 mg/dL — ABNORMAL HIGH (ref 70–99)
Glucose-Capillary: 162 mg/dL — ABNORMAL HIGH (ref 70–99)
Glucose-Capillary: 185 mg/dL — ABNORMAL HIGH (ref 70–99)

## 2019-09-25 LAB — CBC
HCT: 43.3 % (ref 36.0–46.0)
Hemoglobin: 12.4 g/dL (ref 12.0–15.0)
MCH: 24.6 pg — ABNORMAL LOW (ref 26.0–34.0)
MCHC: 28.6 g/dL — ABNORMAL LOW (ref 30.0–36.0)
MCV: 85.7 fL (ref 80.0–100.0)
Platelets: 225 10*3/uL (ref 150–400)
RBC: 5.05 MIL/uL (ref 3.87–5.11)
RDW: 19.2 % — ABNORMAL HIGH (ref 11.5–15.5)
WBC: 11.7 10*3/uL — ABNORMAL HIGH (ref 4.0–10.5)
nRBC: 0 % (ref 0.0–0.2)

## 2019-09-25 MED ORDER — BISMUTH SUBSALICYLATE 262 MG PO CHEW
524.0000 mg | CHEWABLE_TABLET | ORAL | Status: DC | PRN
  Administered 2019-09-26 – 2019-09-29 (×3): 524 mg via ORAL
  Filled 2019-09-25 (×5): qty 2

## 2019-09-25 MED ORDER — INSULIN ASPART 100 UNIT/ML ~~LOC~~ SOLN
6.0000 [IU] | Freq: Three times a day (TID) | SUBCUTANEOUS | Status: DC
  Administered 2019-09-25 – 2019-09-28 (×4): 6 [IU] via SUBCUTANEOUS

## 2019-09-25 MED ORDER — INSULIN GLARGINE 100 UNIT/ML ~~LOC~~ SOLN
20.0000 [IU] | Freq: Every day | SUBCUTANEOUS | Status: DC
  Administered 2019-09-25 – 2019-09-26 (×2): 20 [IU] via SUBCUTANEOUS
  Filled 2019-09-25 (×3): qty 0.2

## 2019-09-25 MED ORDER — INSULIN GLARGINE 100 UNIT/ML ~~LOC~~ SOLN: 5.0000 [IU] | Freq: Every day | SUBCUTANEOUS | Status: DC

## 2019-09-25 NOTE — Progress Notes (Signed)
I was not able to wean patient's O2 amount down at all over night. There were times when I had to increase it to maintain O2 saturation 88% and above.

## 2019-09-25 NOTE — Plan of Care (Signed)

## 2019-09-25 NOTE — Progress Notes (Signed)
This chaplain was present bedside for Pt. F/U spiritual care. The Pt. shared "rough night" and feeling more comfortable today.  The chaplain honored Pt. request for chaplain to return on Thursday.  The chaplain responded yes and applauded Pt. effort in communicating well. This chaplain will return on Thursday and offer an opportunity for reflective listening.

## 2019-09-25 NOTE — Progress Notes (Signed)
Name: Felicia [name] MRN: [id number] DOB: 10-Jan-1971    ADMISSION DATE:  08/15/2019 CONSULTATION DATE:  09/25/2019 REFERRING MD :  Delphia Grates, [facility]IE[facility] COMPLAINT: Respiratory distress, worsening hypoxia  BRIEF HISTORY:  49 yo female with fibrotic NSIP and severe pulmonary hypertension on 10 to 15 liters oxygen presented with worsening hypoxia.    PAST MEDICAL HISTORY:  NSIP, Pulmonary Hypertension, Chronic hypoxic respiratory failure, DVT popliteal vein April 2021, PNA, Shingles  SIGNIFICANT EVENTS:  7/27 admit 7/28 PCCM consult 7/30 palliative care consult. Patient remains very steadfast in hoping for ongoing pulmonary improvement  8/02 slight decrease in O2 support  8/06 Afib with RVR and hypotension, txfr to ICU 8/13 breathing is a little bit more comfortable 8/14 breathing seems to be stable  CONSULTS:  Cardiology >> s/o 8/10 Palliative care  PROCEDURES:  Rt PICC 8/03 >>   SIGNIFICANT STUDIES:   Echocardiogram 04/2019 >> normal LV function, RVSP 64 , enlarged RV, bubble study negative.  PFTs 02/2017 >> FVC 1.25 L 36% predicted, FEV1 1.18 L 40% predicted with DLCO of 17   HRCT 02/2019 >> chronic fibrotic ILD progressed from 2018 with mild focus of honeycombing  Cath 3/ 2021 >> right atrial pressure was 7, PA pressure 60/24 with a mean of 39 and a wedge of 10 her cardiac index was low at 1.91 and output of 3.89. Her PVR is thus 7.  Echo 09/05/19 >> EF 50 to 55%, severe RV systolic dysfx, RVSP 59.1, mod/severe TR  CT chest 09/06/19 >> patchy GGO, interstitial coarsening and traction BTX with basilar predominance, no change compared to December 2018  MICRO:  7/28 BCx2 >> negative 7/28 UC>> multiple species   ANTIBIOTICS:  Cefepime 7/27-7/28 Vancomycin 7/27-7/28 Doxycycline 8/03-8/07  SUBJECTIVE:   No events, remains on extremely high levels of oxygen. C/o fecal urgency, wants to try pepto-bismol  OBJECTIVE:   VITAL SIGNS: Temp:  [97 F (36.1 C)-98.9 F  (37.2 C)] 97.9 F (36.6 C) (08/18 0747) Pulse Rate:  [80-99] 81 (08/18 0747) Resp:  [20-28] 28 (08/18 0747) BP: (97-119)/(48-70) 112/48 (08/18 0747) SpO2:  [89 %-96 %] 92 % (08/18 0747) FiO2 (%):  [80 %] 80 % (08/18 0747)  Physical Exam: GEN: chronically ill [place] woman in NAD HEENT: NRB and HFNC in place  CV: RRR, ext warm PULM: Crackles bases, mildly tachypneic GI: Soft, +BS EXT: No edema NEURO: moves all 4 ext to command PSYCH: AOx3 SKIN: No rashes  fingersticks low Otherwise labs stable   RESOLVED PROBLEMS:    ASSESSMENT/PLAN:   Acute on chronic hypoxic respiratory failure. Severe pulmonary hypertension group 1,2,3 Patient with a slow recovery from exacerbation of underlying ILD and decompensated pulmonary hypertension.  Patient follows at [name] as an outpatient. -Continue on home dose CellCept -Continue Bactrim prophylaxis -Continue prednisone 41m daily -Continue to wean FiO2 as tolerated to maintain goal O2 sats above 85%.  Atrial fibrillation with RVR  -Continue anticoagulation with Eliquis, digoxin plus Toprol for rate control. -Amiodarone has been stopped I agree she is a poor long-term candidate for this.  Group 1, 2, 3 pulmonary hypertension. -Continue sildenafil and torsemide for diuresis. -Patient is intolerant of Tyvaso in the past. -Euvolemia is the best treatment option at this time.  DM type 2 with steroid induced hyperglycemia. Going down on insulin a bit, discussed with Dr. NLonny Prude Hx of depression. -Continue Zoloft  Deconditioning. -Mobility limited by desaturations, not really sure what we can do here  Constipation, anal fissure. I have added peptobismol per  her request  Pressure injury. Wound care per primary  [place]- not really sure she can pull through this flare, we have discussed in past, if she ends up on vent she is not coming off.    Will follow with you Erskine Emery MD PCCM

## 2019-09-25 NOTE — Progress Notes (Signed)
PROGRESS NOTE    Meg Niemeier  ZOX:[id number] DOB: Apr 06, 1970 DOA: 09/04/2019 PCP: Aretta Nip, MD   Brief Narrative: Felicia [name] is a 49 y.o. female with a history of fibrotic NSIP, severe pulmonary hypertension on 10-15 L of oxygen, diabetes mellitus, atrial fibrillation. Patient presented secondary to worsening hypoxia in setting of ILD/NSIP. Difficulty weaning down oxygen.   Assessment & Plan:   Principal Problem:   Acute on chronic respiratory failure with hypoxia (HCC) Active Problems:   Acute on chronic diastolic CHF (congestive heart failure) (HCC)   DNR (do not resuscitate) discussion   Interstitial lung disease ([place])   Hypokalemia   History of DVT (deep vein thrombosis)   Prolonged Q-T interval on ECG   Uncontrolled diabetes mellitus with hyperglycemia (HCC)   Elevated troponin   Pressure injury of skin   Palliative care by specialist   Hypoxia   Abdominal pain   Atrial fibrillation (HCC)   Constipation   Acute on chronic respiratory failure with hypoxia Severe. Requiring heated HFNC with FiO2 of 80% in addition to non-rebreather. Pulmonology on board and helping manage. Patient has been improving marginally.  ILD/NSIP Patient follows with [facility] as an outpatient. Transfer was attempted on 8/4-8/5 which was declined secondary to capacity constraints. -Pulmonology recommendations: Cellcept, prednisone, bactrim prophylaxis, goal SpO2 of >85%  Severe pulmonary hypertension group 1,2,3 -Continue sildenafil  Diabetes mellitus, type 2 Steroid induced hyperglycemia Hemoglobin A1C of 10.2%. Patient is on metformin and insulin as an outpatient. Patient with hypoglycemic episodes inpatient. -Decreased to Lantus 20 units -Decrease to Novolog 6 units TID with meals -Continue sensitive SSI  Depression -Continue Lexapro  Deconditioning Physical therapy recommending SNF  Constipation Anal fissure Patient has been doing somewhat better with sitz  baths. -Miralax  Atrial fibrillation with RVR Unknown type of AF. Patient is on Eliquis, digoxin and metoprolol which have been continued. Amiodarone used for RVR and was subsequently discontinued.  Pressure injury Left buttock, POA    DVT prophylaxis: Eliquis Code Status:   Code Status: Full Code Family Communication: Daughter on telephone, no response Disposition Plan: Discharge home when stable on decreased amount of oxygen   Consultants:   PCCM  Procedures:   None  Antimicrobials:  Vancomycin  Cefepime  Doxycycline  Bactrim    Subjective: Respiratory distress overnight requiring increased amounts of oxygen.  Objective: Vitals:   09/25/19 0200 09/25/19 0334 09/25/19 0747 09/25/19 1134  BP:  (!) 97/57 (!) 112/48 108/73  Pulse: 95 84 81 99  Resp: (!) 23 20 (!) 28 (!) 38  Temp:  (!) 97 F (36.1 C) 97.9 F (36.6 C) 98.3 F (36.8 C)  TempSrc:  Axillary Axillary Axillary  SpO2: 90% 94% 92% 90%  Weight:      Height:        Intake/Output Summary (Last 24 hours) at 09/25/2019 1404 Last data filed at 09/25/2019 0900 Gross per 24 hour  Intake 580 ml  Output 450 ml  Net 130 ml   Filed Weights   09/21/19 0337 09/22/19 0411 09/23/19 0423  Weight: 82.4 kg 82.4 kg 87 kg    Examination:  General exam: Appears calm. Ill appearing. Respiratory system: Diminished with rales heard. Tachypnea with mildly increased respiratory effort. Cardiovascular system: S1 & S2 heard, RRR. No murmurs, rubs, gallops or clicks. Gastrointestinal system: Abdomen is nondistended, soft and nontender. No organomegaly or masses felt. Normal bowel sounds heard. Central nervous system: Alert and oriented. No focal neurological deficits. Musculoskeletal: No edema. No calf tenderness Skin: No  cyanosis. No rashes Psychiatry: Judgement and insight appear normal. Mood & affect appropriate.     Data Reviewed: I have personally reviewed following labs and imaging studies  CBC Lab  Results  Component Value Date   WBC 14.8 (H) 09/20/2019   RBC 5.08 09/20/2019   HGB 12.3 09/20/2019   HCT 43.1 09/20/2019   MCV 84.8 09/20/2019   MCH 24.2 (L) 09/20/2019   PLT 185 09/20/2019   MCHC 28.5 (L) 09/20/2019   RDW 17.7 (H) 09/20/2019   LYMPHSABS 0.2 (L) 09/11/2019   MONOABS 1.0 09/11/2019   EOSABS 0.0 09/11/2019   BASOSABS 0.0 16/60/6301     Last metabolic panel Lab Results  Component Value Date   NA 138 09/20/2019   K 3.9 09/20/2019   CL 91 (L) 09/20/2019   CO2 34 (H) 09/20/2019   BUN 27 (H) 09/20/2019   CREATININE 0.88 09/20/2019   GLUCOSE 423 (H) 09/21/2019   GFRNONAA >60 09/20/2019   GFRAA >60 09/20/2019   CALCIUM 9.4 09/20/2019   PHOS 5.1 (H) 09/12/2019   PROT 5.9 (L) 09/09/2019   ALBUMIN 3.6 09/09/2019   BILITOT 1.2 09/09/2019   ALKPHOS 196 (H) 09/09/2019   AST 23 09/09/2019   ALT 56 (H) 09/09/2019   ANIONGAP 13 09/20/2019    CBG (last 3)  Recent Labs    09/25/19 0746 09/25/19 0934 09/25/19 1133  GLUCAP 57* 107* 161*     GFR: Estimated Creatinine Clearance: 84.2 mL/min (by C-G formula based on SCr of 0.88 mg/dL).  Coagulation Profile: No results for input(s): INR, PROTIME in the last 168 hours.  No results found for this or any previous visit (from the past 240 hour(s)).      Radiology Studies: No results found.      Scheduled Meds: . apixaban  5 mg Oral BID  . calcium citrate  200 mg of elemental calcium Oral Daily  . Chlorhexidine Gluconate Cloth  6 each Topical Daily  . cholecalciferol  1,000 Units Oral Daily  . digoxin  0.125 mg Oral Daily  . escitalopram  10 mg Oral Daily  . insulin aspart  0-5 Units Subcutaneous QHS  . insulin aspart  0-9 Units Subcutaneous TID WC  . insulin aspart  6 Units Subcutaneous TID WC  . insulin glargine  20 Units Subcutaneous Daily  . mouth rinse  15 mL Mouth Rinse BID  . metoprolol succinate  25 mg Oral BID  . midodrine  5 mg Oral TID WC  . multivitamin with minerals  1 tablet Oral  Daily  . mycophenolate  1,500 mg Oral BID  . nystatin  5 mL Oral QID  . pantoprazole  40 mg Oral Daily  . polyethylene glycol  17 g Oral BID  . potassium chloride  40 mEq Oral BID  . predniSONE  30 mg Oral Q breakfast  . Ensure Max Protein  11 oz Oral BID  . senna-docusate  2 tablet Oral BID  . sildenafil  40 mg Oral TID  . sulfamethoxazole-trimethoprim  1 tablet Oral Once per day on Mon Wed Fri   Continuous Infusions:   LOS: 22 days     Cordelia Poche, MD [facility] 09/25/2019, 2:04 PM  If 7PM-7AM, please contact night-coverage [email or web address]

## 2019-09-26 ENCOUNTER — Other Ambulatory Visit: Payer: Self-pay | Admitting: Pulmonary Disease

## 2019-09-26 LAB — URINALYSIS, ROUTINE W REFLEX MICROSCOPIC
Bilirubin Urine: NEGATIVE
Glucose, UA: NEGATIVE mg/dL
Hgb urine dipstick: NEGATIVE
Ketones, ur: NEGATIVE mg/dL
Nitrite: NEGATIVE
Protein, ur: 30 mg/dL — AB
Specific Gravity, Urine: 1.026 (ref 1.005–1.030)
WBC, UA: >50 WBC/hpf — ABNORMAL HIGH (ref 0–5)
pH: 7 (ref 5.0–8.0)

## 2019-09-26 LAB — BASIC METABOLIC PANEL
Anion gap: 11 (ref 5–15)
BUN: 25 mg/dL — ABNORMAL HIGH (ref 6–20)
CO2: 31 mmol/L (ref 22–32)
Calcium: 10.2 mg/dL (ref 8.9–10.3)
Chloride: 97 mmol/L — ABNORMAL LOW (ref 98–111)
Creatinine, Ser: 0.8 mg/dL (ref 0.44–1.00)
GFR calc Af Amer: >60 mL/min (ref >60)
GFR calc non Af Amer: >60 mL/min (ref >60)
Glucose, Bld: 193 mg/dL — ABNORMAL HIGH (ref 70–99)
Potassium: 5.4 mmol/L — ABNORMAL HIGH (ref 3.5–5.1)
Sodium: 139 mmol/L (ref 135–145)

## 2019-09-26 LAB — GLUCOSE, CAPILLARY
Glucose-Capillary: 79 mg/dL (ref 70–99)
Glucose-Capillary: 110 mg/dL — ABNORMAL HIGH (ref 70–99)
Glucose-Capillary: 163 mg/dL — ABNORMAL HIGH (ref 70–99)
Glucose-Capillary: 176 mg/dL — ABNORMAL HIGH (ref 70–99)

## 2019-09-26 LAB — T4, FREE: Free T4: 1.21 ng/dL — ABNORMAL HIGH (ref 0.61–1.12)

## 2019-09-26 MED ORDER — OXYCODONE HCL 5 MG PO TABS
5.0000 mg | ORAL_TABLET | Freq: Four times a day (QID) | ORAL | Status: DC | PRN
  Administered 2019-09-26: 10 mg via ORAL
  Administered 2019-09-26 (×2): 5 mg via ORAL
  Administered 2019-09-27 – 2019-09-29 (×6): 10 mg via ORAL
  Filled 2019-09-26 (×4): qty 2
  Filled 2019-09-26: qty 1
  Filled 2019-09-26 (×3): qty 2

## 2019-09-26 NOTE — Progress Notes (Signed)
PROGRESS NOTE    Felicia [name]  HWY:[id number] DOB: April 12, 1970 DOA: 09/04/2019 PCP: Aretta Nip, MD   Brief Narrative: Felicia [name] is a 49 y.o. female with a history of fibrotic NSIP, severe pulmonary hypertension on 10-15 L of oxygen, diabetes mellitus, atrial fibrillation. Patient presented secondary to worsening hypoxia in setting of ILD/NSIP. Difficulty weaning down oxygen.   Assessment & Plan:   Principal Problem:   Acute on chronic respiratory failure with hypoxia (HCC) Active Problems:   Acute on chronic diastolic CHF (congestive heart failure) (HCC)   Interstitial lung disease (HCC)   Hypokalemia   History of DVT (deep vein thrombosis)   Prolonged Q-T interval on ECG   Uncontrolled diabetes mellitus with hyperglycemia (HCC)   Elevated troponin   Pressure injury of skin   Palliative care by specialist   Hypoxia   Abdominal pain   Atrial fibrillation (HCC)   Constipation   Acute on chronic respiratory failure with hypoxia Severe. Requiring heated HFNC with FiO2 of 90% @ 50 lpm in addition to non-rebreather. Pulmonology on board and helping manage. Patient has been improving marginally. -Pulmonology recommendations: wean FiO2 as able  ILD/NSIP Patient follows with [facility] as an outpatient. Transfer was attempted on 8/4-8/5 which was declined secondary to capacity constraints. -Pulmonology recommendations: Cellcept, prednisone, bactrim prophylaxis, goal SpO2 of >85%  Severe pulmonary hypertension group 1,2,3 -Continue sildenafil  Diabetes mellitus, type 2 Steroid induced hyperglycemia Hemoglobin A1C of 10.2%. Patient is on metformin and insulin as an outpatient. Patient with hypoglycemic episodes inpatient. -Continue Lantus 20 units -Continue Novolog 6 units TID with meals -Continue sensitive SSI  Depression -Continue Lexapro  Deconditioning Physical therapy recommending SNF  Constipation Anal fissure Patient has been doing somewhat better  with sitz baths. -Miralax -Increase to oxycodone 5-10 mg prn  Atrial fibrillation with RVR Unknown type of AF. Patient is on Eliquis, digoxin and metoprolol which have been continued. Amiodarone used for RVR and was subsequently discontinued.  Pressure injury Left buttock, POA    DVT prophylaxis: Eliquis Code Status:   Code Status: Full Code Family Communication: Daughter on telephone, no response and went to voice mail Disposition Plan: Discharge home when stable on decreased amount of oxygen   Consultants:   PCCM  Procedures:   None  Antimicrobials:  Vancomycin  Cefepime  Doxycycline  Bactrim    Subjective: Patient's main complaint is pain from rectal fissure  Objective: Vitals:   09/26/19 0215 09/26/19 0315 09/26/19 0742 09/26/19 0745  BP:  115/75  100/76  Pulse: 82 91 79 78  Resp: (!) 23 (!) _0 Temp:  98.2 F (36.8 C)  98.8 F (37.1 C)  TempSrc:  Axillary  Oral  SpO2: 93% 93% (!) 89% 95%  Weight:      Height:        Intake/Output Summary (Last 24 hours) at 09/26/2019 1109 Last data filed at 09/26/2019 0555 Gross per 24 hour  Intake 90 ml  Output 300 ml  Net -210 ml   Filed Weights   09/21/19 0337 09/22/19 0411 09/23/19 0423  Weight: 82.4 kg 82.4 kg 87 kg    Examination:  General exam: Appears calm and comfortable Respiratory system: Diminished breath sounds. Respiratory effort increased with mild tachypnea and mildly increased work of breathing. Cardiovascular system: S1 & S2 heard, RRR. No murmurs, rubs, gallops or clicks. Gastrointestinal system: Abdomen is nondistended, soft and nontender. No organomegaly or masses felt. Normal bowel sounds heard. Central nervous system: Alert and oriented. No focal  neurological deficits. Musculoskeletal: No edema. No calf tenderness Skin: No cyanosis. No rashes Psychiatry: Judgement and insight appear normal. Mood & affect appropriate.     Data Reviewed: I have personally reviewed following  labs and imaging studies  CBC Lab Results  Component Value Date   WBC 11.7 (H) 09/25/2019   RBC 5.05 09/25/2019   HGB 12.4 09/25/2019   HCT 43.3 09/25/2019   MCV 85.7 09/25/2019   MCH 24.6 (L) 09/25/2019   PLT 225 09/25/2019   MCHC 28.6 (L) 09/25/2019   RDW 19.2 (H) 09/25/2019   LYMPHSABS 0.2 (L) 09/11/2019   MONOABS 1.0 09/11/2019   EOSABS 0.0 09/11/2019   BASOSABS 0.0 82/95/6213     Last metabolic panel Lab Results  Component Value Date   NA 139 09/25/2019   K 5.4 (H) 09/25/2019   CL 97 (L) 09/25/2019   CO2 31 09/25/2019   BUN 25 (H) 09/25/2019   CREATININE 0.80 09/25/2019   GLUCOSE 193 (H) 09/25/2019   GFRNONAA >60 09/25/2019   GFRAA >60 09/25/2019   CALCIUM 10.2 09/25/2019   PHOS 5.1 (H) 09/12/2019   PROT 5.9 (L) 09/09/2019   ALBUMIN 3.6 09/09/2019   BILITOT 1.2 09/09/2019   ALKPHOS 196 (H) 09/09/2019   AST 23 09/09/2019   ALT 56 (H) 09/09/2019   ANIONGAP 11 09/25/2019    CBG (last 3)  Recent Labs    09/25/19 1654 09/25/19 2130 09/26/19 0748  GLUCAP 162* 185* 79     GFR: Estimated Creatinine Clearance: 92.7 mL/min (by C-G formula based on SCr of 0.8 mg/dL).  Coagulation Profile: No results for input(s): INR, PROTIME in the last 168 hours.  No results found for this or any previous visit (from the past 240 hour(s)).      Radiology Studies: No results found.      Scheduled Meds: . apixaban  5 mg Oral BID  . calcium citrate  200 mg of elemental calcium Oral Daily  . Chlorhexidine Gluconate Cloth  6 each Topical Daily  . cholecalciferol  1,000 Units Oral Daily  . digoxin  0.125 mg Oral Daily  . escitalopram  10 mg Oral Daily  . insulin aspart  0-5 Units Subcutaneous QHS  . insulin aspart  0-9 Units Subcutaneous TID WC  . insulin aspart  6 Units Subcutaneous TID WC  . insulin glargine  20 Units Subcutaneous Daily  . mouth rinse  15 mL Mouth Rinse BID  . metoprolol succinate  25 mg Oral BID  . midodrine  5 mg Oral TID WC  .  multivitamin with minerals  1 tablet Oral Daily  . mycophenolate  1,500 mg Oral BID  . nystatin  5 mL Oral QID  . pantoprazole  40 mg Oral Daily  . polyethylene glycol  17 g Oral BID  . potassium chloride  40 mEq Oral BID  . predniSONE  30 mg Oral Q breakfast  . Ensure Max Protein  11 oz Oral BID  . senna-docusate  2 tablet Oral BID  . sildenafil  40 mg Oral TID  . sulfamethoxazole-trimethoprim  1 tablet Oral Once per day on Mon Wed Fri   Continuous Infusions:   LOS: 23 days     Cordelia Poche, MD [facility] 09/26/2019, 11:09 AM  If 7PM-7AM, please contact night-coverage [email or web address]

## 2019-09-26 NOTE — Progress Notes (Signed)
PT Cancellation Note  Patient Details Name: Felicia [name] MRN: [id number] DOB: Jun 18, 1970   Cancelled Treatment:    Reason Eval/Treat Not Completed: Patient declined, no reason specified  patient  Very politely declines PT, states she did not sleep well at all last night and not feeling up to even bed level activities today. Will continue to follow acutely.   Windell Norfolk, DPT, PN1   Supplemental Physical Therapist [facility]    Pager [phone] Acute Rehab Office [phone]

## 2019-09-26 NOTE — Progress Notes (Signed)
   Name: Felicia [name] MRN: [id number] DOB: 1970/10/17    ADMISSION DATE:  08/17/2019 CONSULTATION DATE:  09/26/2019 REFERRING MD :  Delphia Grates, [facility] CHIEF COMPLAINT: Respiratory distress, worsening hypoxia  BRIEF HISTORY:  49 yo female with fibrotic NSIP and severe pulmonary hypertension on 10 to 15 liters oxygen presented with worsening hypoxia.    PAST MEDICAL HISTORY:  NSIP, Pulmonary Hypertension, Chronic hypoxic respiratory failure, DVT popliteal vein April 2021, PNA, Shingles  SIGNIFICANT EVENTS:  7/27 admit 7/28 PCCM consult 7/30 palliative care consult. Patient remains very steadfast in hoping for ongoing pulmonary improvement  8/02 slight decrease in O2 support  8/06 Afib with RVR and hypotension, txfr to ICU 8/13 breathing is a little bit more comfortable 8/14 breathing seems to be stable  CONSULTS:  Cardiology >> s/o 8/10 Palliative care  PROCEDURES:  Rt PICC 8/03 >>   SIGNIFICANT STUDIES:   Echocardiogram 04/2019 >> normal LV function, RVSP 64 , enlarged RV, bubble study negative.  PFTs 02/2017 >> FVC 1.25 L 36% predicted, FEV1 1.18 L 40% predicted with DLCO of 17   HRCT 02/2019 >> chronic fibrotic ILD progressed from 2018 with mild focus of honeycombing  Cath 3/ 2021 >> right atrial pressure was 7, PA pressure 60/24 with a mean of 39 and a wedge of 10 her cardiac index was low at 1.91 and output of 3.89. Her PVR is thus 7.  Echo 09/05/19 >> EF 50 to 55%, severe RV systolic dysfx, RVSP 37.6, mod/severe TR  CT chest 09/06/19 >> patchy GGO, interstitial coarsening and traction BTX with basilar predominance, no change compared to December 2018  MICRO:  7/28 BCx2 >> negative 7/28 UC>> multiple species   ANTIBIOTICS:  Cefepime 7/27-7/28 Vancomycin 7/27-7/28 Doxycycline 8/03-8/07  SUBJECTIVE:   No significant change OBJECTIVE:   VITAL SIGNS: Temp:  [98.2 F (36.8 C)-98.8 F (37.1 C)] 98.8 F (37.1 C) (08/19 0745) Pulse Rate:  [78-99] 78 (08/19  0745) Resp:  [17-38] 17 (08/19 0745) BP: (98-117)/(62-100) 100/76 (08/19 0745) SpO2:  [89 %-95 %] 95 % (08/19 0745) FiO2 (%):  [80 %-90 %] 90 % (08/19 0742)  Physical Exam: General: Obese female in no acute distress HEENT: No JVD or lymphadenopathy is appreciated Neuro: Grossly intact no tremor noted CV: Heart sounds are regular PULM: Diminished breath Currently on 100% FiO2  GI: soft, bsx4 active   Extremities: warm/dry,  edema  Skin: no rashes or lesions    RESOLVED PROBLEMS:    ASSESSMENT/PLAN:   Acute on chronic hypoxic respiratory failure. Severe pulmonary hypertension group 1,2,3 Patient with a slow recovery from exacerbation of underlying ILD and decompensated pulmonary hypertension.  Patient follows at [facility] as an outpatient.  Continue home dose CellCept Prophylaxis with Bactrim Continue steroids at current dose Wean FiO2 as tolerated   Atrial fibrillation with RVR  Anticoagulation and beta-blocker for control  Group 1, 2, 3 pulmonary hypertension.  Continue current treatment Euvolemia Wean FiO2 if possible   DM type 2 with steroid induced hyperglycemia. Per primary  Hx of depression. Per primary  Deconditioning. Per primary  Constipation, anal fissure. Sitz bath requested  Pressure injury. Wound care per primary  [place]- not really sure she can pull through this flare, we have discussed in past, if she ends up on vent she is not coming off.    Richardson Landry Lamont Glasscock ACNP Acute Care Nurse Practitioner [place] Please consult Amion 09/26/2019, 9:29 AM

## 2019-09-26 NOTE — Progress Notes (Signed)
[facility] Documentation  Pt is currently enrolled with [facility] outpatient palliative program.   Liaison will follow through course of hospitalization to notify [facility] palliative team of dc.   Please do not hesitate to outreach with any questions.   Thank you,  Freddie Breech, RN  [facility] Liaison [phone]

## 2019-09-26 NOTE — Progress Notes (Signed)
[employer] Palliative Care Consult Note Telephone: [phone]  Fax: [phone]  PATIENT NAME: Felicia [name] DOB: 1970-12-09 MRN: [id number]  PRIMARY CARE PROVIDER:   Aretta Nip, MD  REFERRING PROVIDER:  Aretta Nip, MD Felicia [name],  Felicia [name] [id number]  RESPONSIBLE PARTY:   Self    RECOMMENDATIONS and PLAN:  Palliative care encounter Z51.5  1.  Advance care planning:  Explanation of Palliative and Hospice care.  Goals of care reviewed.  Pt would like to continue weight loss and stabalization of respiratory function with plans to become eligible for a lung transplant. Continue follow-up with pulmonologist at [name].  She desires CPR and full scope of treatment for her advanced directives.   2.  Shortness of breath:  Related to significant pulmonary HTN. Continue use of high volume supplemental O2 as recommended by pulmonologist.  Short distances for mobility. Modify work/rest schedule. Seek emergency treatment if worsening of respiratory status.   Due to the COVID-19 crisis, this visit occurred from my office via telehealth and was initiated and consented by patient and or family.  I spent 40 minutes providing this consultation,  from 0930 to 1010. More than 50% of the time in this consultation was spent coordinating communication with patient.   HISTORY OF PRESENT ILLNESS:  Felicia [name] is a 49 y.o. year old female with multiple medical problems including T2DM, interstitial lung disease, significant pulmanary hypertension. She was admitted to the hospital from 4/22-4/27 due to acute hypoxic respiratory failure.  Palliative Care was asked to help address goals of care.   CODE STATUS: FULL CODE  PPS: 40% WEAK HOSPICE ELIGIBILITY/DIAGNOSIS: TBD  PAST MEDICAL HISTORY:  Past Medical History:  Diagnosis Date  . Diabetes mellitus without complication ([place])    type 2   . Hypertension   . NSIP (nonspecific  interstitial pneumonitis) ([place])   . Pulmonary hypertension ([place]) 2011  . Respiratory failure with hypoxia ([place]) 05/31/2019  . SVT (supraventricular tachycardia) (HCC)     SOCIAL HX:  Social History   Tobacco Use  . Smoking status: Never Smoker  . Smokeless tobacco: Never Used  . Tobacco comment: never smoked or used tobacco products  Substance Use Topics  . Alcohol use: No    ALLERGIES: No Active Allergies   PERTINENT MEDICATIONS:  No facility-administered encounter medications on file as of 07/05/2019.   Outpatient Encounter Medications as of 07/05/2019  Medication Sig  . acetaminophen (TYLENOL) 500 MG tablet Take 2,000 mg by mouth as needed for moderate pain.   [name] albuterol (PROVENTIL HFA;VENTOLIN HFA) 108 (90 Base) MCG/ACT inhaler Inhale 1 puff into the lungs every 6 (six) hours as needed for wheezing or shortness of breath.  [name] atenolol (TENORMIN) 50 MG tablet Take 75 mg by mouth 2 (two) times daily.   . cholecalciferol (VITAMIN D3) 25 MCG (1000 UT) tablet Take 1,000 Units by mouth daily.  . furosemide (LASIX) 20 MG tablet Take 3 tablets (60 mg total) by mouth 2 (two) times daily.  [name] guaiFENesin (MUCINEX) 600 MG 12 hr tablet Take 600 mg by mouth daily.   . insulin degludec (TRESIBA FLEXTOUCH) 100 UNIT/ML FlexTouch Pen Inject 24 Units into the skin daily.  . insulin lispro (HUMALOG KWIKPEN) 100 UNIT/ML KwikPen Inject 0-20 Units into the skin 2 (two) times daily. Sliding scale based on sugar level  . ipratropium-albuterol (DUONEB) 0.5-2.5 (3) MG/3ML SOLN Inhale 3 mLs into the lungs every 6 (six) hours as needed (sob and wheezing).   [name]  metFORMIN (GLUCOPHAGE-XR) 500 MG 24 hr tablet Take 1,000 mg by mouth at bedtime.   . mycophenolate (CELLCEPT) 500 MG tablet Take 1,500 mg by mouth 2 (two) times daily.   . sildenafil (REVATIO) 20 MG tablet Take 20 mg by mouth 2 (two) times daily.  [name] sulfamethoxazole-trimethoprim (BACTRIM DS) 800-160 MG tablet Take 1 tablet by mouth 3 (three) times a  week. As needed for sinus infection  . verapamil (CALAN-SR) 120 MG CR tablet Take 1 tablet (120 mg total) by mouth at bedtime.  . [DISCONTINUED] apixaban (ELIQUIS) 5 MG TABS tablet Take 1 tablet (5 mg total) by mouth 2 (two) times daily.  . [DISCONTINUED] naproxen sodium (ALEVE) 220 MG tablet Take 440 mg by mouth as needed (pain).  . [DISCONTINUED] predniSONE (DELTASONE) 10 MG tablet Take 40 mg by mouth daily with breakfast.    PHYSICAL EXAM:   General: NAD Pulmonary:no forced respiratory effort Neurological: alert and oriented  Gonzella Lex, NP-C

## 2019-09-27 LAB — URINE CULTURE

## 2019-09-27 LAB — GLUCOSE, CAPILLARY
Glucose-Capillary: 47 mg/dL — ABNORMAL LOW (ref 70–99)
Glucose-Capillary: 95 mg/dL (ref 70–99)
Glucose-Capillary: 102 mg/dL — ABNORMAL HIGH (ref 70–99)
Glucose-Capillary: 212 mg/dL — ABNORMAL HIGH (ref 70–99)
Glucose-Capillary: 172 mg/dL — ABNORMAL HIGH (ref 70–99)
Glucose-Capillary: 118 mg/dL — ABNORMAL HIGH (ref 70–99)

## 2019-09-27 MED ORDER — INSULIN GLARGINE 100 UNIT/ML ~~LOC~~ SOLN: 5.0000 [IU] | Freq: Every day | SUBCUTANEOUS | Status: DC

## 2019-09-27 MED ORDER — INSULIN GLARGINE 100 UNIT/ML ~~LOC~~ SOLN
5.0000 [IU] | Freq: Every day | SUBCUTANEOUS | Status: DC
  Administered 2019-09-27: 5 [IU] via SUBCUTANEOUS
  Filled 2019-09-27 (×2): qty 0.05

## 2019-09-27 NOTE — Progress Notes (Signed)
PROGRESS NOTE    Felicia [name]  XUM:[id number] DOB: 1970/07/23 DOA: 08/20/2019 PCP: Aretta Nip, MD   Brief Narrative: Felicia [name] is a 49 y.o. female with a history of fibrotic NSIP, severe pulmonary hypertension on 10-15 L of oxygen, diabetes mellitus, atrial fibrillation. Patient presented secondary to worsening hypoxia in setting of ILD/NSIP. Difficulty weaning down oxygen.   Assessment & Plan:   Principal Problem:   Acute on chronic respiratory failure with hypoxia (HCC) Active Problems:   Acute on chronic diastolic CHF (congestive heart failure) (HCC)   Interstitial lung disease (HCC)   Hypokalemia   History of DVT (deep vein thrombosis)   Prolonged Q-T interval on ECG   Uncontrolled diabetes mellitus with hyperglycemia (HCC)   Elevated troponin   Pressure injury of skin   Palliative care by specialist   Hypoxia   Abdominal pain   Atrial fibrillation (HCC)   Constipation   Acute on chronic respiratory failure with hypoxia Severe. Requiring heated HFNC with FiO2 of 90% @ 50 lpm in addition to non-rebreather. Pulmonology on board and helping manage. Patient has been improving marginally. -Pulmonology recommendations: wean FiO2 as able  ILD/NSIP Patient follows with [facility] as an outpatient. Transfer was attempted on 8/4-8/5 which was declined secondary to capacity constraints. -Pulmonology recommendations: Cellcept, prednisone, bactrim prophylaxis, goal SpO2 of >85%  Severe pulmonary hypertension group 1,2,3 -Continue sildenafil  Diabetes mellitus, type 2 Steroid induced hyperglycemia Hemoglobin A1C of 10.2%. Patient is on metformin and insulin as an outpatient. Patient with hypoglycemic episodes inpatient which has recurred. -Decrease to Lantus 5 units -Continue Novolog 6 units TID with meals -Continue sensitive SSI  Depression -Continue Lexapro  Deconditioning Physical therapy recommending SNF  Constipation Anal fissure Patient has been  doing somewhat better with sitz baths. -Miralax -Continue oxycodone 5-10 mg prn  Atrial fibrillation with RVR Unknown type of AF. Patient is on Eliquis, digoxin and metoprolol which have been continued. Amiodarone used for RVR and was subsequently discontinued.  Tremors Chronic issue. Patient also appears to have proptosis. She states that there has been concern for Graves in the past but no real workup. On atenolol as an outpatient and on metoprolol inpatient. Elevated free T4 with normal TSH.  Pressure injury Left buttock, POA    DVT prophylaxis: Eliquis Code Status:   Code Status: Full Code Family Communication: None at bedside Disposition Plan: Discharge home when stable on decreased amount of oxygen   Consultants:   PCCM  Procedures:   None  Antimicrobials:  Vancomycin  Cefepime  Doxycycline  Bactrim    Subjective: Breathing is stable.  Objective: Vitals:   09/27/19 0250 09/27/19 0757 09/27/19 0810 09/27/19 1231  BP:   104/71 112/69  Pulse: 90 89 92 88  Resp: 15 (!) _0 Temp:   97.8 F (36.6 C) 97.6 F (36.4 C)  TempSrc:      SpO2: 94% 94% 94%   Weight:      Height:       No intake or output data in the 24 hours ending 09/27/19 1254 Filed Weights   09/21/19 0337 09/22/19 0411 09/23/19 0423  Weight: 82.4 kg 82.4 kg 87 kg    Examination:  General exam: Appears calm and comfortable Respiratory system: Diminished. Increased respiratory effort Cardiovascular system: S1 & S2 heard, RRR. No murmurs, rubs, gallops or clicks. Gastrointestinal system: Abdomen is nondistended, soft and nontender. No organomegaly or masses felt. Normal bowel sounds heard. Central nervous system: Alert and oriented. No focal neurological deficits.  Musculoskeletal: No edema. No calf tenderness Skin: No cyanosis. No rashes Psychiatry: Judgement and insight appear normal. Mood & affect appropriate.     Data Reviewed: I have personally reviewed following labs and  imaging studies  CBC Lab Results  Component Value Date   WBC 11.7 (H) 09/25/2019   RBC 5.05 09/25/2019   HGB 12.4 09/25/2019   HCT 43.3 09/25/2019   MCV 85.7 09/25/2019   MCH 24.6 (L) 09/25/2019   PLT 225 09/25/2019   MCHC 28.6 (L) 09/25/2019   RDW 19.2 (H) 09/25/2019   LYMPHSABS 0.2 (L) 09/11/2019   MONOABS 1.0 09/11/2019   EOSABS 0.0 09/11/2019   BASOSABS 0.0 61/95/0932     Last metabolic panel Lab Results  Component Value Date   NA 139 09/25/2019   K 5.4 (H) 09/25/2019   CL 97 (L) 09/25/2019   CO2 31 09/25/2019   BUN 25 (H) 09/25/2019   CREATININE 0.80 09/25/2019   GLUCOSE 193 (H) 09/25/2019   GFRNONAA >60 09/25/2019   GFRAA >60 09/25/2019   CALCIUM 10.2 09/25/2019   PHOS 5.1 (H) 09/12/2019   PROT 5.9 (L) 09/09/2019   ALBUMIN 3.6 09/09/2019   BILITOT 1.2 09/09/2019   ALKPHOS 196 (H) 09/09/2019   AST 23 09/09/2019   ALT 56 (H) 09/09/2019   ANIONGAP 11 09/25/2019    CBG (last 3)  Recent Labs    09/27/19 0826 09/27/19 0927 09/27/19 1227  GLUCAP 47* 95 102*     GFR: Estimated Creatinine Clearance: 92.7 mL/min (by C-G formula based on SCr of 0.8 mg/dL).  Coagulation Profile: No results for input(s): INR, PROTIME in the last 168 hours.  Recent Results (from the past 240 hour(s))  Culture, Urine     Status: Abnormal   Collection Time: 09/26/19 10:59 AM   Specimen: Urine, Clean Catch  Result Value Ref Range Status   Specimen Description URINE, CLEAN CATCH  Final   Special Requests   Final    NONE Performed at [facility], [address], [place], [place] [address]    Culture MULTIPLE SPECIES PRESENT, SUGGEST RECOLLECTION (A)  Final   Report Status 09/27/2019 FINAL  Final        Radiology Studies: No results found.      Scheduled Meds:  apixaban  5 mg Oral BID   calcium citrate  200 mg of elemental calcium Oral Daily   Chlorhexidine Gluconate Cloth  6 each Topical Daily   cholecalciferol  1,000 Units Oral Daily   digoxin   0.125 mg Oral Daily   escitalopram  10 mg Oral Daily   insulin aspart  0-5 Units Subcutaneous QHS   insulin aspart  0-9 Units Subcutaneous TID WC   insulin aspart  6 Units Subcutaneous TID WC   insulin glargine  5 Units Subcutaneous Daily   mouth rinse  15 mL Mouth Rinse BID   metoprolol succinate  25 mg Oral BID   midodrine  5 mg Oral TID WC   multivitamin with minerals  1 tablet Oral Daily   mycophenolate  1,500 mg Oral BID   nystatin  5 mL Oral QID   pantoprazole  40 mg Oral Daily   polyethylene glycol  17 g Oral BID   predniSONE  30 mg Oral Q breakfast   Ensure Max Protein  11 oz Oral BID   senna-docusate  2 tablet Oral BID   sildenafil  40 mg Oral TID   sulfamethoxazole-trimethoprim  1 tablet Oral Once per day on Mon  Wed Fri   Continuous Infusions:   LOS: 24 days     Cordelia Poche, MD [facility] 09/27/2019, 12:54 PM  If 7PM-7AM, please contact night-coverage [email or web address]

## 2019-09-27 NOTE — TOC Progression Note (Addendum)
Transition of Care [facility]) - Progression Note    Patient Details  Name: Felicia [name] MRN: [id number] Date of Birth: [birth year]  Transition of Care [name]) CM/SW Contact  Joanne Chars, LCSW Phone Number: 09/27/2019, 3:26 PM  Clinical Narrative:   Pt not medically stable for discharge. Pt stated earlier that she does not want SNF.  MD reports today he does not think pt will be able to DC home with [facility] and will need SNF.  Pt currently has two SNF offers.  Will need to address this when pt improves.     Expected Discharge Plan: [place] Barriers to Discharge: Continued Medical Work up  Expected Discharge Plan and Services Expected Discharge Plan: [place] Choice: [place] arrangements for the past 2 months: Single Family Home                                       Social Determinants of Health (SDOH) Interventions    Readmission Risk Interventions No flowsheet data found.

## 2019-09-27 NOTE — Progress Notes (Signed)
PT Cancellation Note  Patient Details Name: Felicia [name] MRN: [id number] DOB: 1970-10-03   Cancelled Treatment:    Reason Eval/Treat Not Completed: Patient declined, no reason specified patient politely declines PT due to "just having gotten comfortable and pain to come down", then states that she needs to get more mobile and get working but today is just not the day. States she wants to get to EOB and maybe to a chair on Monday, and also asks about therband for her arms and legs but refuses to allow PT to get TB/to practice LE therband exercises in the bed. Did not want to do bed level activities or get to EOB today. This is her second consecutive refusal- if she refuses therapy services on Monday, we will sign off of her care per department policy.    Windell Norfolk, DPT, PN1   Supplemental Physical Therapist [facility]    Pager [phone] Acute Rehab Office [phone]

## 2019-09-27 NOTE — Progress Notes (Signed)
Hypoglycemic Event  CBG: 47  Treatment: Sprite  Symptoms: Lethargy  Follow-up CBG: [id number] CBG Result:95  Possible Reasons for Event: Poor po intake/insulin  Comments/MD notified:Yes, See MAR for new orders    Darleen Crocker RN

## 2019-09-27 NOTE — Progress Notes (Addendum)
Occupational Therapy Treatment Patient Details Name: Felicia [name] MRN: [id number] DOB: [birth year] Today's Date: 09/27/2019    History of present illness Patient is a 49 y/o female who presents with SOB, cough, weakness. 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 PMH includes nonspecific interstitial pneumonitis, DVT, DM, pulmonary HTN, diastolic HF, chronic hypoxic respiratory failure on 12-15L/min 02 [place]   OT comments  Pt with slow progression towards OT goals and limited OT session completed today. Attempted PT/OT co-tx for EOB/OOB activities with pt declining to attempt at this time (requested to attempt tomorrow or Monday). Pt did request theraband for UE exercises. OT provided yellow theraband, but pt declined to run through these exercises today. With encouragement, pt completed AROM and verbal recall of UE HEP exercises. Plan to attempt OOB activities during next session.   Follow Up Recommendations  Supervision/Assistance - 24 hour;SNF    Equipment Recommendations  3 in 1 bedside commode;Wheelchair (measurements OT);Wheelchair cushion (measurements OT)    Recommendations for Other Services      Precautions / Restrictions Precautions Precautions: Fall Precaution Comments: watch 02, on 50L HF, hx of fall this admission, nonrebreather used as needed by pt Restrictions Weight Bearing Restrictions: No       Mobility Bed Mobility               General bed mobility comments: pt declined to attempt  Transfers                 General transfer comment: pt declined to attempt    Balance                                           ADL either performed or assessed with clinical judgement   ADL                                               Vision   Vision Assessment?: No apparent  visual deficits   Perception     Praxis      Cognition Arousal/Alertness: Awake/alert Behavior During Therapy: Flat affect Overall Cognitive Status: Within Functional Limits for tasks assessed                                          Exercises Exercises: Other exercises Other Exercises Other Exercises: AROM demonstration of UE exercises with verbal explanation by patient   Shoulder Instructions       General Comments Pt received on 50 L HHFNC with NRB mask, able to demonstrate ability to converse with therapists with reports of fatigue while talking. Attempted to educate on EOB/OOB activities but pt declined to attempt. Pt reports desire for theraband, gave pt yellow theraband but she declined to demonstrate exercises due to tremors today.Verbalize and performed AROM of each UE HEP exercise she knows. Encouraged her to complete these this weekend.    Pertinent Vitals/ Pain       Pain Assessment: Faces Faces Pain Scale: Hurts little more Pain Location: bottom Pain Descriptors / Indicators: Grimacing;Guarding;Discomfort Pain Intervention(s): Monitored during  session  Home Living                                          Prior Functioning/Environment              Frequency  Min 2X/week        Progress Toward Goals  OT Goals(current goals can now be found in the care plan section)  Progress towards OT goals: Progressing toward goals (minimal progress)  Acute Rehab OT Goals Patient Stated Goal: to be able to breathe OT Goal Formulation: With patient Time For Goal Achievement: 10/03/19 Potential to Achieve Goals: Fair ADL Goals Pt Will Perform Grooming: with modified independence;sitting Pt Will Perform Upper Body Bathing: with supervision;sitting Additional ADL Goal #1: Pt will perform bed mobility with supervision as precursor to EOB/OOB ADL. Additional ADL Goal #2: Pt will participate in OOB/mobility assessment as precursor to  ADL. Additional ADL Goal #3: Pt will identify at least 3 energy conservation strategies to implement during ADL/functional tasks.  Plan Discharge plan remains appropriate    Co-evaluation                 AM-PAC OT "6 Clicks" Daily Activity     Outcome Measure   Help from another person eating meals?: A Little Help from another person taking care of personal grooming?: A Little Help from another person toileting, which includes using toliet, bedpan, or urinal?: Total Help from another person bathing (including washing, rinsing, drying)?: A Lot Help from another person to put on and taking off regular upper body clothing?: A Lot Help from another person to put on and taking off regular lower body clothing?: Total 6 Click Score: 12    End of Session Equipment Utilized During Treatment: Oxygen  OT Visit Diagnosis: Muscle weakness (generalized) (M62.81);Other (comment)   Activity Tolerance Patient limited by fatigue;Patient limited by lethargy   Patient Left in bed;with call bell/phone within reach;with bed alarm set   Nurse Communication          Time: 1848-5927 OT Time Calculation (min): 9 min  Charges: OT General Charges $OT Visit: 1 Visit OT Treatments $Therapeutic Exercise: 8-22 mins  Layla Maw, OTR/L   Layla Maw 09/27/2019, 1:40 PM

## 2019-09-27 NOTE — Progress Notes (Signed)
This chaplain was present for spiritual care F/U.  The Pt. is bathing at time of visit.  The chaplain checked in with the Pt. RN-George.  The RN described today as stable.  The chaplain greeted the Pt. from the door and received a smile in return.  This chaplain is available for F/U spiritual care as needed.

## 2019-09-28 ENCOUNTER — Other Ambulatory Visit: Payer: Self-pay

## 2019-09-28 DIAGNOSIS — Z7189 Other specified counseling: Secondary | ICD-10-CM

## 2019-09-28 LAB — GLUCOSE, CAPILLARY
Glucose-Capillary: 66 mg/dL — ABNORMAL LOW (ref 70–99)
Glucose-Capillary: 97 mg/dL (ref 70–99)
Glucose-Capillary: 60 mg/dL — ABNORMAL LOW (ref 70–99)
Glucose-Capillary: 58 mg/dL — ABNORMAL LOW (ref 70–99)
Glucose-Capillary: 73 mg/dL (ref 70–99)
Glucose-Capillary: 149 mg/dL — ABNORMAL HIGH (ref 70–99)
Glucose-Capillary: 222 mg/dL — ABNORMAL HIGH (ref 70–99)
Glucose-Capillary: 168 mg/dL — ABNORMAL HIGH (ref 70–99)
Glucose-Capillary: 54 mg/dL — ABNORMAL LOW (ref 70–99)

## 2019-09-28 LAB — BASIC METABOLIC PANEL
Anion gap: 17 — ABNORMAL HIGH (ref 5–15)
BUN: 19 mg/dL (ref 6–20)
CO2: 25 mmol/L (ref 22–32)
Calcium: 9.6 mg/dL (ref 8.9–10.3)
Chloride: 94 mmol/L — ABNORMAL LOW (ref 98–111)
Creatinine, Ser: 0.93 mg/dL (ref 0.44–1.00)
GFR calc Af Amer: >60 mL/min (ref >60)
GFR calc non Af Amer: >60 mL/min (ref >60)
Glucose, Bld: 190 mg/dL — ABNORMAL HIGH (ref 70–99)
Potassium: 4.3 mmol/L (ref 3.5–5.1)
Sodium: 136 mmol/L (ref 135–145)

## 2019-09-28 MED ORDER — MORPHINE SULFATE (PF) 2 MG/ML IV SOLN
2.0000 mg | INTRAVENOUS | Status: DC | PRN
Start: 2019-09-28 — End: 2019-09-28

## 2019-09-28 MED ORDER — MORPHINE SULFATE (PF) 2 MG/ML IV SOLN
2.0000 mg | Freq: Once | INTRAVENOUS | Status: AC
  Administered 2019-09-28: 2 mg via INTRAVENOUS
  Filled 2019-09-28: qty 1

## 2019-09-28 MED ORDER — MORPHINE SULFATE (PF) 2 MG/ML IV SOLN
2.0000 mg | INTRAVENOUS | Status: AC | PRN
  Administered 2019-09-28 – 2019-09-29 (×3): 2 mg via INTRAVENOUS
  Filled 2019-09-28 (×3): qty 1

## 2019-09-28 NOTE — Plan of Care (Signed)
  Problem: Education: Goal: Knowledge of General Education information will improve Description: Including pain rating scale, medication(s)/side effects and non-pharmacologic comfort measures Outcome: Progressing   Problem: Health Behavior/Discharge Planning: Goal: Ability to manage health-related needs will improve Outcome: Progressing   Problem: Clinical Measurements: Goal: Ability to maintain clinical measurements within normal limits will improve Outcome: Progressing Goal: Will remain free from infection Outcome: Progressing Goal: Diagnostic test results will improve Outcome: Progressing Goal: Respiratory complications will improve Outcome: Progressing Goal: Cardiovascular complication will be avoided Outcome: Progressing   Problem: Activity: Goal: Risk for activity intolerance will decrease Outcome: Progressing   Problem: Nutrition: Goal: Adequate nutrition will be maintained Outcome: Progressing   Problem: Elimination: Goal: Will not experience complications related to bowel motility Outcome: Progressing Goal: Will not experience complications related to urinary retention Outcome: Progressing   Problem: Coping: Goal: Level of anxiety will decrease Outcome: Progressing   Problem: Pain Managment: Goal: General experience of comfort will improve Outcome: Progressing   Problem: Safety: Goal: Ability to remain free from injury will improve Outcome: Progressing

## 2019-09-28 NOTE — Progress Notes (Signed)
PROGRESS NOTE    Felicia [name]  DGU:[id number] DOB: Jun 09, 1970 DOA: 08/10/2019 PCP: Felicia Nip, MD   Brief Narrative: Felicia [name] is a 49 y.o. female with a history of fibrotic NSIP, severe pulmonary hypertension on 10-15 L of oxygen, diabetes mellitus, atrial fibrillation. Patient presented secondary to worsening hypoxia in setting of ILD/NSIP. Difficulty weaning down oxygen.   Assessment & Plan:   Principal Problem:   Acute on chronic respiratory failure with hypoxia (HCC) Active Problems:   Acute on chronic diastolic CHF (congestive heart failure) (HCC)   Interstitial lung disease (HCC)   Hypokalemia   History of DVT (deep vein thrombosis)   Prolonged Q-T interval on ECG   Uncontrolled diabetes mellitus with hyperglycemia (HCC)   Elevated troponin   Pressure injury of skin   Palliative care by specialist   Hypoxia   Abdominal pain   Atrial fibrillation (HCC)   Constipation   Acute on chronic respiratory failure with hypoxia Severe. Requiring heated HFNC with FiO2 of 90% @ 55 lpm in addition to non-rebreather. Pulmonology on board and helping manage. Patient had been improving marginally but now is worsening. -Pulmonology recommendations: wean FiO2 as able  ILD/NSIP Patient follows with [facility] as an outpatient. Transfer was attempted on 8/4-8/5 which was declined secondary to capacity constraints. -Pulmonology recommendations: Cellcept, prednisone, bactrim prophylaxis, goal SpO2 of >85%  Severe pulmonary hypertension group 1,2,3 -Continue sildenafil  Diabetes mellitus, type 2 Steroid induced hyperglycemia Hemoglobin A1C of 10.2%. Patient is on metformin and insulin as an outpatient. Continued hypoglycemia -Discontinue Lantus -Continue Novolog 6 units TID with meals -Continue sensitive SSI  Depression -Continue Lexapro  Deconditioning Physical therapy recommending SNF  Constipation Anal fissure Patient has been doing somewhat better with  sitz baths. -Miralax -Continue oxycodone 5-10 mg prn  Atrial fibrillation with RVR Unknown type of AF. Patient is on Eliquis, digoxin and metoprolol which have been continued. Amiodarone used for RVR and was subsequently discontinued.  Tremors Chronic issue. Patient also appears to have proptosis. She states that there has been concern for Graves in the past but no real workup. On atenolol as an outpatient and on metoprolol inpatient. Elevated free T4 with normal TSH.  Pressure injury Left buttock, POA    DVT prophylaxis: Eliquis Code Status:   Code Status: Full Code Family Communication: None at bedside Disposition Plan: Discharge home when stable on decreased amount of oxygen   Consultants:   PCCM  Procedures:   None  Antimicrobials:  Vancomycin  Cefepime  Doxycycline  Bactrim    Subjective: Patient states she is not short of breath, but during exam she stated she did not want to talk anymore because of her breathing.  Objective: Vitals:   09/27/19 2309 09/28/19 0101 09/28/19 0310 09/28/19 0811  BP: 96/62  (!) 83/69 (!) 108/43  Pulse: 85 96 88 (!) 102  Resp: (!) 24 (!) 29 20 (!) 24  Temp: 98.5 F (36.9 C)  98 F (36.7 C) (!) 97.3 F (36.3 C)  TempSrc: Oral  Oral Oral  SpO2: 94% 90% 91% 94%  Weight:      Height:       No intake or output data in the 24 hours ending 09/28/19 1056 Filed Weights   09/21/19 0337 09/22/19 0411 09/23/19 0423  Weight: 82.4 kg 82.4 kg 87 kg    Examination:  General exam: Appears anxious and comfortable Respiratory system: Diminished. Rales. Respiratory effort increased with retractions and tachypnea present. Cardiovascular system: S1 & S2 heard, Tachycardia with normal  rhythm. Gastrointestinal system: Abdomen is nondistended, soft and nontender. No organomegaly or masses felt. Normal bowel sounds heard. Central nervous system: Alert and oriented. No focal neurological deficits. Musculoskeletal: No edema. No calf  tenderness Skin: No cyanosis. No rashes Psychiatry: Judgement and insight appear normal. Anxious appearing.      Data Reviewed: I have personally reviewed following labs and imaging studies  CBC Lab Results  Component Value Date   WBC 11.7 (H) 09/25/2019   RBC 5.05 09/25/2019   HGB 12.4 09/25/2019   HCT 43.3 09/25/2019   MCV 85.7 09/25/2019   MCH 24.6 (L) 09/25/2019   PLT 225 09/25/2019   MCHC 28.6 (L) 09/25/2019   RDW 19.2 (H) 09/25/2019   LYMPHSABS 0.2 (L) 09/11/2019   MONOABS 1.0 09/11/2019   EOSABS 0.0 09/11/2019   BASOSABS 0.0 50/10/3816     Last metabolic panel Lab Results  Component Value Date   NA 139 09/25/2019   K 5.4 (H) 09/25/2019   CL 97 (L) 09/25/2019   CO2 31 09/25/2019   BUN 25 (H) 09/25/2019   CREATININE 0.80 09/25/2019   GLUCOSE 193 (H) 09/25/2019   GFRNONAA >60 09/25/2019   GFRAA >60 09/25/2019   CALCIUM 10.2 09/25/2019   PHOS 5.1 (H) 09/12/2019   PROT 5.9 (L) 09/09/2019   ALBUMIN 3.6 09/09/2019   BILITOT 1.2 09/09/2019   ALKPHOS 196 (H) 09/09/2019   AST 23 09/09/2019   ALT 56 (H) 09/09/2019   ANIONGAP 11 09/25/2019    CBG (last 3)  Recent Labs    09/28/19 0842 09/28/19 0905 09/28/19 0948  GLUCAP 58* 73 149*     GFR: Estimated Creatinine Clearance: 92.7 mL/min (by C-G formula based on SCr of 0.8 mg/dL).  Coagulation Profile: No results for input(s): INR, PROTIME in the last 168 hours.  Recent Results (from the past 240 hour(s))  Culture, Urine     Status: Abnormal   Collection Time: 09/26/19 10:59 AM   Specimen: Urine, Clean Catch  Result Value Ref Range Status   Specimen Description URINE, CLEAN CATCH  Final   Special Requests   Final    NONE Performed at [facility], [address], [place], [place] [address]    Culture MULTIPLE SPECIES PRESENT, SUGGEST RECOLLECTION (A)  Final   Report Status 09/27/2019 FINAL  Final        Radiology Studies: No results found.      Scheduled Meds: . apixaban  5 mg  Oral BID  . calcium citrate  200 mg of elemental calcium Oral Daily  . Chlorhexidine Gluconate Cloth  6 each Topical Daily  . cholecalciferol  1,000 Units Oral Daily  . digoxin  0.125 mg Oral Daily  . escitalopram  10 mg Oral Daily  . insulin aspart  0-5 Units Subcutaneous QHS  . insulin aspart  0-9 Units Subcutaneous TID WC  . insulin aspart  6 Units Subcutaneous TID WC  . insulin glargine  5 Units Subcutaneous Daily  . mouth rinse  15 mL Mouth Rinse BID  . metoprolol succinate  25 mg Oral BID  . midodrine  5 mg Oral TID WC  . multivitamin with minerals  1 tablet Oral Daily  . mycophenolate  1,500 mg Oral BID  . nystatin  5 mL Oral QID  . pantoprazole  40 mg Oral Daily  . polyethylene glycol  17 g Oral BID  . predniSONE  30 mg Oral Q breakfast  . Ensure Max Protein  11 oz Oral BID  .  senna-docusate  2 tablet Oral BID  . sildenafil  40 mg Oral TID  . sulfamethoxazole-trimethoprim  1 tablet Oral Once per day on Mon Wed Fri   Continuous Infusions:   LOS: 25 days     Cordelia Poche, MD [facility] 09/28/2019, 10:56 AM  If 7PM-7AM, please contact night-coverage [email or web address]

## 2019-09-29 ENCOUNTER — Inpatient Hospital Stay (HOSPITAL_COMMUNITY): Payer: 59

## 2019-09-29 DIAGNOSIS — I469 Cardiac arrest, cause unspecified: Secondary | ICD-10-CM

## 2019-09-29 LAB — POCT I-STAT 7, (LYTES, BLD GAS, ICA,H+H)
Acid-base deficit: 7 mmol/L — ABNORMAL HIGH (ref 0.0–2.0)
Acid-base deficit: 5 mmol/L — ABNORMAL HIGH (ref 0.0–2.0)
Acid-base deficit: 2 mmol/L (ref 0.0–2.0)
Bicarbonate: 22 mmol/L (ref 20.0–28.0)
Bicarbonate: 22.6 mmol/L (ref 20.0–28.0)
Bicarbonate: 24.1 mmol/L (ref 20.0–28.0)
Calcium, Ion: 1.08 mmol/L — ABNORMAL LOW (ref 1.15–1.40)
Calcium, Ion: 0.99 mmol/L — ABNORMAL LOW (ref 1.15–1.40)
Calcium, Ion: 1.06 mmol/L — ABNORMAL LOW (ref 1.15–1.40)
HCT: 35 % — ABNORMAL LOW (ref 36.0–46.0)
HCT: 36 % (ref 36.0–46.0)
HCT: 37 % (ref 36.0–46.0)
Hemoglobin: 11.9 g/dL — ABNORMAL LOW (ref 12.0–15.0)
Hemoglobin: 12.2 g/dL (ref 12.0–15.0)
Hemoglobin: 12.6 g/dL (ref 12.0–15.0)
O2 Saturation: 95 %
O2 Saturation: 100 %
O2 Saturation: 100 %
Patient temperature: 98.6
Patient temperature: 98.6
Patient temperature: 98.9
Potassium: 3.5 mmol/L (ref 3.5–5.1)
Potassium: 3.6 mmol/L (ref 3.5–5.1)
Potassium: 3.8 mmol/L (ref 3.5–5.1)
Sodium: 139 mmol/L (ref 135–145)
Sodium: 138 mmol/L (ref 135–145)
Sodium: 138 mmol/L (ref 135–145)
TCO2: 24 mmol/L (ref 22–32)
TCO2: 24 mmol/L (ref 22–32)
TCO2: 26 mmol/L (ref 22–32)
pCO2 arterial: 62.2 mmHg — ABNORMAL HIGH (ref 32.0–48.0)
pCO2 arterial: 53.5 mmHg — ABNORMAL HIGH (ref 32.0–48.0)
pCO2 arterial: 48.2 mmHg — ABNORMAL HIGH (ref 32.0–48.0)
pH, Arterial: 7.157 — CL (ref 7.350–7.450)
pH, Arterial: 7.233 — ABNORMAL LOW (ref 7.350–7.450)
pH, Arterial: 7.308 — ABNORMAL LOW (ref 7.350–7.450)
pO2, Arterial: 97 mmHg (ref 83.0–108.0)
pO2, Arterial: 269 mmHg — ABNORMAL HIGH (ref 83.0–108.0)
pO2, Arterial: 195 mmHg — ABNORMAL HIGH (ref 83.0–108.0)

## 2019-09-29 LAB — TROPONIN I (HIGH SENSITIVITY)
Troponin I (High Sensitivity): 198 ng/L (ref <18)
Troponin I (High Sensitivity): 373 ng/L (ref <18)

## 2019-09-29 LAB — POCT I-STAT, CHEM 8
BUN: 25 mg/dL — ABNORMAL HIGH (ref 6–20)
Calcium, Ion: 1.09 mmol/L — ABNORMAL LOW (ref 1.15–1.40)
Chloride: 99 mmol/L (ref 98–111)
Creatinine, Ser: 1.3 mg/dL — ABNORMAL HIGH (ref 0.44–1.00)
Glucose, Bld: 328 mg/dL — ABNORMAL HIGH (ref 70–99)
HCT: 39 % (ref 36.0–46.0)
Hemoglobin: 13.3 g/dL (ref 12.0–15.0)
Potassium: 3.8 mmol/L (ref 3.5–5.1)
Sodium: 140 mmol/L (ref 135–145)
TCO2: 22 mmol/L (ref 22–32)

## 2019-09-29 LAB — URINALYSIS, ROUTINE W REFLEX MICROSCOPIC
Bacteria, UA: NONE SEEN
Bilirubin Urine: NEGATIVE
Glucose, UA: NEGATIVE mg/dL
Ketones, ur: NEGATIVE mg/dL
Nitrite: NEGATIVE
Protein, ur: 100 mg/dL — AB
Specific Gravity, Urine: 1.025 (ref 1.005–1.030)
WBC, UA: >50 WBC/hpf — ABNORMAL HIGH (ref 0–5)
pH: 5 (ref 5.0–8.0)

## 2019-09-29 LAB — GLUCOSE, CAPILLARY
Glucose-Capillary: 34 mg/dL — CL (ref 70–99)
Glucose-Capillary: 50 mg/dL — ABNORMAL LOW (ref 70–99)
Glucose-Capillary: 88 mg/dL (ref 70–99)
Glucose-Capillary: 208 mg/dL — ABNORMAL HIGH (ref 70–99)
Glucose-Capillary: 290 mg/dL — ABNORMAL HIGH (ref 70–99)

## 2019-09-29 LAB — BASIC METABOLIC PANEL
Anion gap: 22 — ABNORMAL HIGH (ref 5–15)
BUN: 17 mg/dL (ref 6–20)
CO2: 22 mmol/L (ref 22–32)
Calcium: 8.1 mg/dL — ABNORMAL LOW (ref 8.9–10.3)
Chloride: 101 mmol/L (ref 98–111)
Creatinine, Ser: 1.21 mg/dL — ABNORMAL HIGH (ref 0.44–1.00)
GFR calc Af Amer: >60 mL/min (ref >60)
GFR calc non Af Amer: 52 mL/min — ABNORMAL LOW (ref >60)
Glucose, Bld: 224 mg/dL — ABNORMAL HIGH (ref 70–99)
Potassium: 4 mmol/L (ref 3.5–5.1)
Sodium: 145 mmol/L (ref 135–145)

## 2019-09-29 LAB — CBC
HCT: 40.8 % (ref 36.0–46.0)
Hemoglobin: 11.4 g/dL — ABNORMAL LOW (ref 12.0–15.0)
MCH: 24.9 pg — ABNORMAL LOW (ref 26.0–34.0)
MCHC: 27.9 g/dL — ABNORMAL LOW (ref 30.0–36.0)
MCV: 89.3 fL (ref 80.0–100.0)
Platelets: 238 10*3/uL (ref 150–400)
RBC: 4.57 MIL/uL (ref 3.87–5.11)
RDW: 20.1 % — ABNORMAL HIGH (ref 11.5–15.5)
WBC: 9.6 10*3/uL (ref 4.0–10.5)
nRBC: 1.5 % — ABNORMAL HIGH (ref 0.0–0.2)

## 2019-09-29 LAB — ECHOCARDIOGRAM LIMITED
Height: 65 in
Weight: 3068.8 [oz_av]

## 2019-09-29 LAB — LACTIC ACID, PLASMA
Lactic Acid, Venous: 7.5 mmol/L (ref 0.5–1.9)
Lactic Acid, Venous: 7.1 mmol/L (ref 0.5–1.9)

## 2019-09-29 LAB — MAGNESIUM: Magnesium: 2.1 mg/dL (ref 1.7–2.4)

## 2019-09-29 MED ORDER — PIPERACILLIN-TAZOBACTAM 3.375 G IVPB
3.3750 g | Freq: Three times a day (TID) | INTRAVENOUS | Status: DC
  Administered 2019-09-29 – 2019-09-30 (×2): 3.375 g via INTRAVENOUS
  Filled 2019-09-29 (×3): qty 50

## 2019-09-29 MED ORDER — DIGOXIN 125 MCG PO TABS
0.1250 mg | ORAL_TABLET | Freq: Every day | ORAL | Status: DC
  Administered 2019-09-30: 0.125 mg
  Filled 2019-09-29: qty 1

## 2019-09-29 MED ORDER — MYCOPHENOLATE MOFETIL HCL 500 MG IV SOLR
1500.0000 mg | Freq: Two times a day (BID) | INTRAVENOUS | Status: DC
  Administered 2019-09-29 – 2019-10-01 (×5): 1500 mg via INTRAVENOUS
  Filled 2019-09-29 (×9): qty 45

## 2019-09-29 MED ORDER — METHYLPREDNISOLONE SODIUM SUCC 40 MG IJ SOLR
20.0000 mg | Freq: Every day | INTRAMUSCULAR | Status: DC
  Administered 2019-09-29: 20 mg via INTRAVENOUS
  Filled 2019-09-29: qty 1

## 2019-09-29 MED ORDER — LORAZEPAM 2 MG/ML IJ SOLN
INTRAMUSCULAR | Status: AC
  Administered 2019-09-29: 2 mg via INTRAVENOUS
  Filled 2019-09-29: qty 1

## 2019-09-29 MED ORDER — SULFAMETHOXAZOLE-TRIMETHOPRIM 800-160 MG PO TABS
1.0000 | ORAL_TABLET | ORAL | Status: DC
  Administered 2019-09-30: 1
  Filled 2019-09-29 (×2): qty 1

## 2019-09-29 MED ORDER — HYDROXYZINE HCL 10 MG/5ML PO SYRP
25.0000 mg | ORAL_SOLUTION | Freq: Three times a day (TID) | ORAL | Status: DC
  Filled 2019-09-29 (×3): qty 12.5

## 2019-09-29 MED ORDER — SODIUM CHLORIDE 0.9 % IV SOLN
2000.0000 mg | Freq: Once | INTRAVENOUS | Status: AC
  Administered 2019-09-29: 2000 mg via INTRAVENOUS
  Filled 2019-09-29: qty 20

## 2019-09-29 MED ORDER — VITAMIN D 25 MCG (1000 UNIT) PO TABS
1000.0000 [IU] | ORAL_TABLET | Freq: Every day | ORAL | Status: DC
  Administered 2019-09-30: 1000 [IU]
  Filled 2019-09-29: qty 1

## 2019-09-29 MED ORDER — MORPHINE SULFATE (PF) 4 MG/ML IV SOLN
INTRAVENOUS | Status: AC
  Administered 2019-09-29: 4 mg via INTRAVENOUS
  Filled 2019-09-29: qty 1

## 2019-09-29 MED ORDER — LORAZEPAM 2 MG/ML IJ SOLN: 2.0000 mg | Freq: Once | INTRAMUSCULAR | Status: AC

## 2019-09-29 MED ORDER — MIDODRINE HCL 5 MG PO TABS
5.0000 mg | ORAL_TABLET | Freq: Three times a day (TID) | ORAL | Status: DC
  Administered 2019-09-30 (×3): 5 mg
  Filled 2019-09-29 (×3): qty 1

## 2019-09-29 MED ORDER — MORPHINE SULFATE (PF) 4 MG/ML IV SOLN: 4.0000 mg | Freq: Once | INTRAVENOUS | Status: AC

## 2019-09-29 MED ORDER — PROPOFOL 1000 MG/100ML IV EMUL
0.0000 ug/kg/min | INTRAVENOUS | Status: DC
  Administered 2019-09-29: 20 ug/kg/min via INTRAVENOUS
  Administered 2019-09-30 (×2): 25 ug/kg/min via INTRAVENOUS
  Administered 2019-09-30: 20 ug/kg/min via INTRAVENOUS
  Administered 2019-10-01 (×2): 25 ug/kg/min via INTRAVENOUS
  Filled 2019-09-29 (×4): qty 100
  Filled 2019-09-29: qty 200

## 2019-09-29 MED ORDER — HEPARIN (PORCINE) 25000 UT/250ML-% IV SOLN
1350.0000 [IU]/h | INTRAVENOUS | Status: DC
  Administered 2019-09-29: 1350 [IU]/h via INTRAVENOUS
  Filled 2019-09-29: qty 250

## 2019-09-29 MED ORDER — PIPERACILLIN-TAZOBACTAM 3.375 G IVPB 30 MIN
3.3750 g | Freq: Once | INTRAVENOUS | Status: AC
  Administered 2019-09-29: 3.375 g via INTRAVENOUS
  Filled 2019-09-29: qty 50

## 2019-09-29 MED ORDER — PANTOPRAZOLE SODIUM 40 MG IV SOLR
40.0000 mg | Freq: Every day | INTRAVENOUS | Status: DC
  Administered 2019-09-29 – 2019-10-01 (×3): 40 mg via INTRAVENOUS
  Filled 2019-09-29 (×3): qty 40

## 2019-09-29 MED ORDER — AMIODARONE HCL IN DEXTROSE 360-4.14 MG/200ML-% IV SOLN
30.0000 mg/h | INTRAVENOUS | Status: DC
  Administered 2019-09-29 – 2019-10-02 (×7): 30 mg/h via INTRAVENOUS
  Filled 2019-09-29 (×7): qty 200

## 2019-09-29 MED ORDER — SENNOSIDES-DOCUSATE SODIUM 8.6-50 MG PO TABS
2.0000 | ORAL_TABLET | Freq: Two times a day (BID) | ORAL | Status: DC
  Administered 2019-09-29 – 2019-09-30 (×3): 2
  Filled 2019-09-29 (×3): qty 2

## 2019-09-29 MED ORDER — METOPROLOL TARTRATE 25 MG/10 ML ORAL SUSPENSION: 25.0000 mg | Freq: Two times a day (BID) | ORAL | Status: DC

## 2019-09-29 MED ORDER — AMIODARONE LOAD VIA INFUSION
150.0000 mg | Freq: Once | INTRAVENOUS | Status: AC
  Administered 2019-09-29: 150 mg via INTRAVENOUS
  Filled 2019-09-29: qty 83.34

## 2019-09-29 MED ORDER — BISMUTH SUBSALICYLATE 262 MG PO CHEW
524.0000 mg | CHEWABLE_TABLET | ORAL | Status: DC | PRN
  Filled 2019-09-29: qty 2

## 2019-09-29 MED ORDER — POLYETHYLENE GLYCOL 3350 17 G PO PACK
17.0000 g | PACK | Freq: Two times a day (BID) | ORAL | Status: DC
  Administered 2019-09-29 – 2019-09-30 (×3): 17 g
  Filled 2019-09-29 (×3): qty 1

## 2019-09-29 MED ORDER — EPINEPHRINE HCL 5 MG/250ML IV SOLN IN NS
0.5000 ug/min | INTRAVENOUS | Status: DC
  Administered 2019-09-29: 10 ug/min via INTRAVENOUS
  Administered 2019-09-30 – 2019-10-02 (×5): 5 ug/min via INTRAVENOUS
  Filled 2019-09-29 (×5): qty 250

## 2019-09-29 MED ORDER — NOREPINEPHRINE 4 MG/250ML-% IV SOLN: 0.0000 ug/min | INTRAVENOUS | Status: DC

## 2019-09-29 MED ORDER — GLUCOSE 4 G PO CHEW
CHEWABLE_TABLET | ORAL | Status: AC
  Administered 2019-09-29: 4 g
  Filled 2019-09-29: qty 1

## 2019-09-29 MED ORDER — SILDENAFIL CITRATE 20 MG PO TABS
40.0000 mg | ORAL_TABLET | Freq: Three times a day (TID) | ORAL | Status: DC
  Administered 2019-09-29 – 2019-09-30 (×4): 40 mg
  Filled 2019-09-29 (×4): qty 2

## 2019-09-29 MED ORDER — DOCUSATE SODIUM 50 MG/5ML PO LIQD
100.0000 mg | Freq: Two times a day (BID) | ORAL | Status: DC
  Administered 2019-09-29 – 2019-09-30 (×2): 100 mg via ORAL
  Filled 2019-09-29 (×2): qty 10

## 2019-09-29 MED ORDER — VANCOMYCIN HCL 1750 MG/350ML IV SOLN
1750.0000 mg | Freq: Once | INTRAVENOUS | Status: AC
  Administered 2019-09-29: 1750 mg via INTRAVENOUS
  Filled 2019-09-29: qty 350

## 2019-09-29 MED ORDER — AMIODARONE HCL IN DEXTROSE 360-4.14 MG/200ML-% IV SOLN
60.0000 mg/h | INTRAVENOUS | Status: AC
  Administered 2019-09-29 (×2): 60 mg/h via INTRAVENOUS
  Filled 2019-09-29 (×2): qty 200

## 2019-09-29 MED ORDER — ESCITALOPRAM OXALATE 10 MG PO TABS
10.0000 mg | ORAL_TABLET | Freq: Every day | ORAL | Status: DC
  Administered 2019-09-30: 10 mg
  Filled 2019-09-29: qty 1

## 2019-09-29 MED ORDER — ADULT MULTIVITAMIN LIQUID CH
15.0000 mL | Freq: Every day | ORAL | Status: DC
  Administered 2019-09-30: 15 mL
  Filled 2019-09-29 (×3): qty 15

## 2019-09-29 MED ORDER — METHYLPREDNISOLONE SODIUM SUCC 125 MG IJ SOLR
80.0000 mg | Freq: Two times a day (BID) | INTRAMUSCULAR | Status: DC
  Administered 2019-09-30 – 2019-10-01 (×3): 80 mg via INTRAVENOUS
  Filled 2019-09-29 (×3): qty 2

## 2019-09-29 MED ORDER — SIMETHICONE 80 MG PO CHEW: 80.0000 mg | CHEWABLE_TABLET | Freq: Four times a day (QID) | ORAL | Status: DC | PRN

## 2019-09-29 MED ORDER — GERHARDT'S BUTT CREAM
TOPICAL_CREAM | Freq: Four times a day (QID) | CUTANEOUS | Status: DC
  Filled 2019-09-29: qty 1

## 2019-09-29 MED ORDER — VANCOMYCIN HCL IN DEXTROSE 1-5 GM/200ML-% IV SOLN
1000.0000 mg | Freq: Two times a day (BID) | INTRAVENOUS | Status: DC
  Administered 2019-09-30: 1000 mg via INTRAVENOUS
  Filled 2019-09-29: qty 200

## 2019-09-29 MED ORDER — GUAIFENESIN-DM 100-10 MG/5ML PO SYRP: 5.0000 mL | ORAL_SOLUTION | ORAL | Status: DC | PRN

## 2019-09-29 MED ORDER — CALCIUM GLUCONATE-NACL 1-0.675 GM/50ML-% IV SOLN
1.0000 g | Freq: Once | INTRAVENOUS | Status: AC
  Administered 2019-09-29: 1000 mg via INTRAVENOUS
  Filled 2019-09-29: qty 50

## 2019-09-29 MED ORDER — NOREPINEPHRINE 16 MG/250ML-% IV SOLN
0.0000 ug/min | INTRAVENOUS | Status: DC
  Administered 2019-09-29: 20 ug/min via INTRAVENOUS
  Administered 2019-09-29: 35 ug/min via INTRAVENOUS
  Administered 2019-09-30: 40 ug/min via INTRAVENOUS
  Administered 2019-09-30 (×2): 30 ug/min via INTRAVENOUS
  Administered 2019-10-01: 34 ug/min via INTRAVENOUS
  Administered 2019-10-01: 25 ug/min via INTRAVENOUS
  Filled 2019-09-29 (×7): qty 250

## 2019-09-29 MED ORDER — POTASSIUM CHLORIDE 20 MEQ/15ML (10%) PO SOLN
40.0000 meq | Freq: Once | ORAL | Status: AC
  Administered 2019-09-29: 40 meq via ORAL
  Filled 2019-09-29: qty 30

## 2019-09-29 MED ORDER — VITAL HIGH PROTEIN PO LIQD
1000.0000 mL | ORAL | Status: DC
  Administered 2019-09-29: 1000 mL

## 2019-09-29 MED FILL — Medication: Qty: 1 | Status: AC

## 2019-09-29 NOTE — Significant Event (Signed)
Rapid Response Event Note   Reason for Call :  Respiratory distress  Initial Focused Assessment:  PCCM received a call about this patient when I was sitting next to her.  Since I knew the patient I accompanied the NP over to the room.  This patient is requiring more o2 to get her stable.  When we walked into the room the patient was AO x 4 but looked like she was struggling to breathe.  Dr. Tamala Julian came to the room and told her what the situation was looking like.  She stated that she will go on the ventilator if she has to but she was not ready yet.  She stated that she can get her o2 level back up she just needed to calm down and focus.  This patient has been on heated high flow and NRB for awhile.  She asked that she not have to transfer to an ICU bed if she is transferred.  Dr. Tamala Julian is okay and gave me a verbal order to leave her in her current bed.  BP 102/69 (81) Pulse 127 o2 90 on HHFNC and NRB Resp 30   Interventions:  Vitals taken and 4 mg of morphine given to help WOB  Plan of Care:  Patient is going to be upgraded to a higher level of care but non urgently.     Event Summary:   MD Notified: PCCM Call Time: Was not called Arrival Time: 1100 End Time: [place], RN

## 2019-09-29 NOTE — Progress Notes (Signed)
ANTICOAGULATION CONSULT NOTE - Initial Consult  Pharmacy Consult for heparin  Indication: history of DVT  No Active Allergies  Patient Measurements: Height: _0  (165.1 cm) Weight: 87 kg (191 lb 12.8 oz) IBW/kg (Calculated) : 57  Heparin dosing wt: 75kg    Vital Signs: Temp: 99.1 F (37.3 C) (08/22 1129) Temp Source: Axillary (08/22 1129) BP: 111/84 (08/22 1129) Pulse Rate: 46 (08/22 1200)  Labs: Recent Labs    09/28/19 1102 09/29/19 1315  HGB  --  11.9*  HCT  --  35.0*  CREATININE 0.93  --     Estimated Creatinine Clearance: 79.7 mL/min (by C-G formula based on SCr of 0.93 mg/dL).   Medical History: Past Medical History:  Diagnosis Date  . Diabetes mellitus without complication ([place])    type 2   . Hypertension   . NSIP (nonspecific interstitial pneumonitis) ([place])   . Pulmonary hypertension ([place]) 2011  . Respiratory failure with hypoxia ([place]) 05/31/2019  . SVT (supraventricular tachycardia) (HCC)     Medications:  Medications Prior to Admission  Medication Sig Dispense Refill Last Dose  . acetaminophen (TYLENOL) 500 MG tablet Take 2,000 mg by mouth as needed for moderate pain.    unk  . albuterol (PROVENTIL HFA;VENTOLIN HFA) 108 (90 Base) MCG/ACT inhaler Inhale 1 puff into the lungs every 6 (six) hours as needed for wheezing or shortness of breath.   unk  . apixaban (ELIQUIS) 5 MG TABS tablet Take 1 tablet (5 mg total) by mouth 2 (two) times daily. 60 tablet 5 08/14/2019 at 01000  . atenolol (TENORMIN) 50 MG tablet Take 75 mg by mouth 2 (two) times daily.    08/16/2019 at 01000  . calcium citrate (CALCITRATE - DOSED IN MG ELEMENTAL CALCIUM) 950 (200 Ca) MG tablet Take 200 mg of elemental calcium by mouth daily.   08/31/2019  . cholecalciferol (VITAMIN D3) 25 MCG (1000 UT) tablet Take 1,000 Units by mouth daily.   Past Week at Unknown time  . escitalopram (LEXAPRO) 10 MG tablet Take 10 mg by mouth daily.   08/19/2019 at Unknown time  . furosemide (LASIX) 20 MG  tablet Take 3 tablets (60 mg total) by mouth 2 (two) times daily. 180 tablet 0 08/16/2019 at Unknown time  . guaiFENesin (MUCINEX) 600 MG 12 hr tablet Take 600 mg by mouth daily.    08/14/2019 at Unknown time  . ibuprofen (ADVIL) 200 MG tablet Take 400 mg by mouth every 6 (six) hours as needed for moderate pain.   Past Week at Unknown time  . insulin lispro (HUMALOG KWIKPEN) 100 UNIT/ML KwikPen Inject 0-20 Units into the skin 2 (two) times daily. Sliding scale based on sugar level   09/01/2019  . ipratropium-albuterol (DUONEB) 0.5-2.5 (3) MG/3ML SOLN Inhale 3 mLs into the lungs every 6 (six) hours as needed (sob and wheezing).    Past Week at Unknown time  . metFORMIN (GLUCOPHAGE-XR) 500 MG 24 hr tablet Take 1,000 mg by mouth at bedtime.    09/02/2019  . mycophenolate (CELLCEPT) 500 MG tablet Take 1,500 mg by mouth 2 (two) times daily.    08/24/2019 at Unknown time  . predniSONE (DELTASONE) 10 MG tablet TAKE 4 TABLETS BY MOUTH DAILY WITH BREAKFAST (Patient taking differently: Take 40 mg by mouth daily with breakfast. ) 120 tablet 1 08/08/2019 at Unknown time  . sildenafil (REVATIO) 20 MG tablet Take 20 mg by mouth 2 (two) times daily.   09/02/2019  . sulfamethoxazole-trimethoprim (BACTRIM DS) 800-160 MG tablet Take  1 tablet by mouth 3 (three) times a week. As needed for sinus infection   unk  . verapamil (CALAN-SR) 120 MG CR tablet Take 1 tablet (120 mg total) by mouth at bedtime. 30 tablet 0 09/02/2019  . gabapentin (NEURONTIN) 300 MG capsule Take 1 capsule (300 mg total) by mouth 3 (three) times daily for 7 days. 21 capsule 0   . insulin degludec (TRESIBA FLEXTOUCH) 100 UNIT/ML FlexTouch Pen Inject 24 Units into the skin daily.   09/01/2019  . LANTUS SOLOSTAR 100 UNIT/ML Solostar Pen Inject 30 Units into the skin at bedtime.     . potassium chloride SA (KLOR-CON) 20 MEQ tablet Take 2 tablets (40 mEq total) by mouth daily. 60 tablet 0    Scheduled:  . amiodarone  150 mg Intravenous Once  . apixaban  5 mg  Oral BID  . calcium citrate  200 mg of elemental calcium Oral Daily  . Chlorhexidine Gluconate Cloth  6 each Topical Daily  . cholecalciferol  1,000 Units Oral Daily  . digoxin  0.125 mg Oral Daily  . escitalopram  10 mg Oral Daily  . hydrOXYzine  25 mg Oral TID  . insulin aspart  0-5 Units Subcutaneous QHS  . insulin aspart  0-9 Units Subcutaneous TID WC  . mouth rinse  15 mL Mouth Rinse BID  . [START ON 09/30/2019] methylPREDNISolone (SOLU-MEDROL) injection  80 mg Intravenous Q12H  . midodrine  5 mg Oral TID WC  . multivitamin with minerals  1 tablet Oral Daily  . mycophenolate  1,500 mg Oral BID  . nystatin  5 mL Oral QID  . pantoprazole  40 mg Oral Daily  . polyethylene glycol  17 g Oral BID  . Ensure Max Protein  11 oz Oral BID  . senna-docusate  2 tablet Oral BID  . sildenafil  40 mg Oral TID  . sulfamethoxazole-trimethoprim  1 tablet Oral Once per day on Mon Wed Fri    Assessment: 49 yo female s/p code blue with CPR and now VDRF. She is on apixaban for history of VTE. Pharmacy consulted to dose heparin while oral anticoagulation is on hold. -hg= 11/9, SCr= 0.9 -last apixaban dose ~ 10am today   Goal of Therapy:  Heparin level 0.3-0.7 units/ml aPTT 66-102 seconds Monitor platelets by anticoagulation protocol: Yes   Plan:   -Start heparin at 1350 units/hr at 10pm -Heparin level and aptt  in 6 hours and daily wth CBC daily  Hildred Laser, PharmD Clinical Pharmacist **Pharmacist phone directory can now be found on [email or web address] ([email or web address]).  Listed under [place].

## 2019-09-29 NOTE — Progress Notes (Signed)
  Echocardiogram 2D Echocardiogram has been performed.  Merrie Roof F 09/29/2019, 2:22 PM

## 2019-09-29 NOTE — Progress Notes (Addendum)
PROGRESS NOTE    Felicia [name]  VFI:[id number] DOB: 08-05-1970 DOA: 08/17/2019 PCP: Aretta Nip, MD   Brief Narrative: Felicia [name] is a 49 y.o. female with a history of fibrotic NSIP, severe pulmonary hypertension on 10-15 L of oxygen, diabetes mellitus, atrial fibrillation. Patient presented secondary to worsening hypoxia in setting of ILD/NSIP. Difficulty weaning down oxygen.   Assessment & Plan:   Principal Problem:   Acute on chronic respiratory failure with hypoxia (HCC) Active Problems:   Acute on chronic diastolic CHF (congestive heart failure) (HCC)   Goals of care, counseling/discussion   Interstitial lung disease (HCC)   Hypokalemia   History of DVT (deep vein thrombosis)   Prolonged Q-T interval on ECG   Uncontrolled diabetes mellitus with hyperglycemia (HCC)   Elevated troponin   Pressure injury of skin   Palliative care by specialist   Hypoxia   Abdominal pain   Atrial fibrillation ([place])   Constipation   Advanced care planning/counseling discussion   Acute on chronic respiratory failure with hypoxia Severe. Requiring heated HFNC with FiO2 of 100% @ 60 lpm in addition to non-rebreather. Pulmonology on board and helping manage. Patient had been improving marginally but now is worsening. -Pulmonology recommendations: wean FiO2 as able -Will re-consult pulmonology for evaluation today secondary to progressively worsening respiratory status with high potential for complete respiratory failure  ILD/NSIP Patient follows with [facility] as an outpatient. Transfer was attempted on 8/4-8/5 which was declined secondary to capacity constraints. -Pulmonology recommendations: Cellcept, prednisone, bactrim prophylaxis, goal SpO2 of >85% -Will switch to Solu-medrol IV and discontinue prednisone  Severe pulmonary hypertension group 1,2,3 -Continue sildenafil  Diabetes mellitus, type 2 Steroid induced hyperglycemia Hemoglobin A1C of 10.2%. Patient is on  metformin and insulin as an outpatient. Continued hypoglycemia as she is not eating well. -Discontinue Novolog 6 units TID with meals -Continue sensitive SSI  Depression -Continue Lexapro  Deconditioning Physical therapy recommending SNF  Constipation Anal fissure Patient has been doing somewhat better with sitz baths. -Miralax -Continue oxycodone 5-10 mg prn  Atrial fibrillation with RVR Unknown type of AF. Patient is on Eliquis, digoxin and metoprolol which have been continued. Amiodarone used for RVR and was subsequently discontinued. Currently in rapid rhythm secondary to not receiving metoprolol in the last 12 hours -Metoprolol 25 mg BID  Tremors Chronic issue. Patient also appears to have proptosis. She states that there has been concern for Graves in the past but no real workup. On atenolol as an outpatient and on metoprolol inpatient. Elevated free T4 with normal TSH.  Pressure injury Left buttock, POA   DVT prophylaxis: Eliquis Code Status:   Code Status: Full Code Family Communication: Called daughter again with no answer. Number verified with patient Disposition Plan: Discharge home when stable on decreased amount of oxygen. At this time, patient has a high risk for complete respiratory failure and may need ICU admission and intubation with mechanical ventilation.    Consultants:   PCCM  Procedures:   None  Antimicrobials:  Vancomycin  Cefepime  Doxycycline  Bactrim    Subjective: Patient reports worsened respiratory status. Worsening dyspnea especially with attempting to take medications. Requesting her medications be given as suspension or IV if possible. No chest pain. No hemoptysis  Objective: Vitals:   09/29/19 0000 09/29/19 0109 09/29/19 0437 09/29/19 0753  BP:   (!) 114/97 (!) 107/42  Pulse: 96 93 80 (!) 108  Resp: (!) 28 (!) 27 (!) 32 (!) 38  Temp:   98.3 F (36.8 C)  98.4 F (36.9 C)  TempSrc:   Oral Axillary  SpO2: (!) 89% 93% 90%  93%  Weight:      Height:        Intake/Output Summary (Last 24 hours) at 09/29/2019 1045 Last data filed at 09/28/2019 2257 Gross per 24 hour  Intake --  Output 2 ml  Net -2 ml   Filed Weights   09/21/19 0337 09/22/19 0411 09/23/19 0423  Weight: 82.4 kg 82.4 kg 87 kg    Examination:  General exam: Appears uncomfortable, anxious.  Respiratory system: Diminished with no wheezing and could not hear rales. Significant tachypnea and significantly increased respiratory effort Cardiovascular system: S1 & S2 heard, RRR. No murmurs, rubs, gallops or clicks. Gastrointestinal system: Abdomen is nondistended, soft and nontender. No organomegaly or masses felt. Normal bowel sounds heard. Central nervous system: Alert and oriented. No focal neurological deficits. Musculoskeletal: No edema. No calf tenderness Skin: No cyanosis. No rashes Psychiatry: Judgement and insight appear normal. Mood & affect appropriate.     Data Reviewed: I have personally reviewed following labs and imaging studies  CBC Lab Results  Component Value Date   WBC 11.7 (H) 09/25/2019   RBC 5.05 09/25/2019   HGB 12.4 09/25/2019   HCT 43.3 09/25/2019   MCV 85.7 09/25/2019   MCH 24.6 (L) 09/25/2019   PLT 225 09/25/2019   MCHC 28.6 (L) 09/25/2019   RDW 19.2 (H) 09/25/2019   LYMPHSABS 0.2 (L) 09/11/2019   MONOABS 1.0 09/11/2019   EOSABS 0.0 09/11/2019   BASOSABS 0.0 34/28/7681     Last metabolic panel Lab Results  Component Value Date   NA 136 09/28/2019   K 4.3 09/28/2019   CL 94 (L) 09/28/2019   CO2 25 09/28/2019   BUN 19 09/28/2019   CREATININE 0.93 09/28/2019   GLUCOSE 190 (H) 09/28/2019   GFRNONAA >60 09/28/2019   GFRAA >60 09/28/2019   CALCIUM 9.6 09/28/2019   PHOS 5.1 (H) 09/12/2019   PROT 5.9 (L) 09/09/2019   ALBUMIN 3.6 09/09/2019   BILITOT 1.2 09/09/2019   ALKPHOS 196 (H) 09/09/2019   AST 23 09/09/2019   ALT 56 (H) 09/09/2019   ANIONGAP 17 (H) 09/28/2019    CBG (last 3)  Recent  Labs    09/28/19 2130 09/29/19 0744 09/29/19 0937  GLUCAP 54* 34* 50*     GFR: Estimated Creatinine Clearance: 79.7 mL/min (by C-G formula based on SCr of 0.93 mg/dL).  Coagulation Profile: No results for input(s): INR, PROTIME in the last 168 hours.  Recent Results (from the past 240 hour(s))  Culture, Urine     Status: Abnormal   Collection Time: 09/26/19 10:59 AM   Specimen: Urine, Clean Catch  Result Value Ref Range Status   Specimen Description URINE, CLEAN CATCH  Final   Special Requests   Final    NONE Performed at [facility], [address], [place], [place] [address]    Culture MULTIPLE SPECIES PRESENT, SUGGEST RECOLLECTION (A)  Final   Report Status 09/27/2019 FINAL  Final        Radiology Studies: DG CHEST PORT 1 VIEW  Result Date: 09/29/2019 CLINICAL DATA:  Acute on chronic respiratory failure with hypoxia EXAM: PORTABLE CHEST 1 VIEW COMPARISON:  09/20/2019 chest radiograph. FINDINGS: Low lung volumes. Stable cardiomediastinal silhouette with normal heart size. No pneumothorax. No pleural effusion. Diffuse patchy reticular and hazy opacities in both lungs appears similar to slightly worsened in the lower lungs. IMPRESSION: Low lung volumes. Diffuse patchy reticular  and hazy opacities in both lungs appear similar to slightly worsened in the lower lungs. Favor chronic interstitial lung disease with superimposed atelectasis, although a superimposed aspiration or developing pneumonia in the lower left lung cannot be excluded. Electronically Signed   By: Ilona Sorrel M.D.   On: 09/29/2019 10:30        Scheduled Meds: . apixaban  5 mg Oral BID  . calcium citrate  200 mg of elemental calcium Oral Daily  . Chlorhexidine Gluconate Cloth  6 each Topical Daily  . cholecalciferol  1,000 Units Oral Daily  . digoxin  0.125 mg Oral Daily  . escitalopram  10 mg Oral Daily  . hydrOXYzine  25 mg Oral TID  . insulin aspart  0-5 Units Subcutaneous QHS  . insulin  aspart  0-9 Units Subcutaneous TID WC  . mouth rinse  15 mL Mouth Rinse BID  . metoprolol tartrate  25 mg Oral BID  . midodrine  5 mg Oral TID WC  . multivitamin with minerals  1 tablet Oral Daily  . mycophenolate  1,500 mg Oral BID  . nystatin  5 mL Oral QID  . pantoprazole  40 mg Oral Daily  . polyethylene glycol  17 g Oral BID  . predniSONE  30 mg Oral Q breakfast  . Ensure Max Protein  11 oz Oral BID  . senna-docusate  2 tablet Oral BID  . sildenafil  40 mg Oral TID  . sulfamethoxazole-trimethoprim  1 tablet Oral Once per day on Mon Wed Fri   Continuous Infusions:   LOS: 26 days     Cordelia Poche, MD [facility] 09/29/2019, 10:45 AM  If 7PM-7AM, please contact night-coverage [email or web address]

## 2019-09-29 NOTE — Progress Notes (Signed)
Arrived from 2W with CPR in progress.  ACLS performed and ROSC was achieved.    During assessment, pt was noted to have a significant amount of fecal material in and around her vulva and vagina.  Thorough peri care performed prior to Foley placement. Pt's vulva area excoriated with several open lesions.  Wound care nurse consulted and ointment prescribed.  2W nurse advised that patient refused to be cleaned up after having a bowel movement for at least six hours.

## 2019-09-29 NOTE — Progress Notes (Signed)
This page was sent to [employer], MD at 1002.   I know this has been an ongoing thing, but I just want to express my concern for this pt being on the floor. Her HR is maitinting in 120-130's, RR in 50's and she is on 60L HFNC and the NRB. Her BG is in 50's and she is unwilling to eat or drink. I was able to get her to take a glucose tablet and always have an option for D50, but can we give her any maintance fluids with dextrose? Her mews score is RED and she is not really appropriate for 2 west with her o2 requirement.   Pt continued to decline with increased work of breathing. CCM came to bedside to assess and decided to move pt to ICU when a bed became available. Pt with increased anxiety and rapid response nurse ordered 55m Ativan. Once ativan given pt quickly became agonal and code blue called. Pt intubated by CCM and RT with no complications. Pt became bradycardic and 0.5 mg of Epi given. Pt given a bed on 2H and en route there pt went into PEA at 1244. Chest compressions were started and 1 mg of Epi was given. [facility]. [facility] at bedside.

## 2019-09-29 NOTE — Procedures (Signed)
Cardiopulmonary Resuscitation Note  Melodee Lupe  [id number]  1970/04/01  Date:09/29/19  Time:1:23 PM   Provider Performing:Emberli Ballester Cipriano Mile   Procedure: Cardiopulmonary Resuscitation [phone])  Indication(s) Loss of Pulse  Consent N/A  Anesthesia N/A   Time Out N/A   Sterile Technique Hand hygiene, gloves   Procedure Description Called to patient's room for CODE BLUE. Initial rhythm was PEA/Asystole. Patient received high quality chest compressions for 12 minutes with defibrillation or cardioversion when appropriate. Epinephrine was administered every 3 minutes as directed by time Therapist, nutritional. Additional pharmacologic interventions included sodium bicarbonate. Additional procedural interventions include central line, arterial line and intubation.  Return of spontaneous circulation was achieved.  Family to be notified.   Complications/Tolerance N/A   EBL N/A   Specimen(s) N/A  Estimated time to ROSC: 12 minutes, probably less, had intermittent ROSC

## 2019-09-29 NOTE — Procedures (Addendum)
Arrived to patient room to recheck on her, now agonal breathing and unresponsive, still had pulse.  Intubation Procedure Note  Mehr Depaoli  [id number]  [birth year]  Date:09/29/19  Time:1:24 PM   Provider Performing:Epimenio Schetter C Tamala Julian    Procedure: Intubation (66440)  Indication(s) Respiratory Failure  Consent Unable to obtain consent due to emergent nature of procedure.   Anesthesia Etomidate and Rocuronium   Time Out Verified patient identification, verified procedure, site/side was marked, verified correct patient position, special equipment/implants available, medications/allergies/relevant history reviewed, required imaging and test results available.   Sterile Technique Usual hand hygeine, masks, and gloves were used   Procedure Description Patient positioned in bed supine.  Sedation given as noted above.  Patient was intubated with endotracheal tube using Glidescope.  View was Grade 4 no glottis structures visible.  Number of attempts was 1.  Colorimetric CO2 detector was consistent with tracheal placement.   Complications/Tolerance Bradycardic after, improved with epinephrine push, about 10 min later on transport to ICU she went into cardiac arrest (see separate note)  EBL Minimal   Specimen(s) None

## 2019-09-29 NOTE — Procedures (Signed)
Central Venous Catheter Insertion Procedure Note  Felicia [name]  [id number]  1970/09/25  Date:09/29/19  Time:1:27 PM   Provider Performing:Maeci Kalbfleisch Loletha Grayer Tamala Julian   Procedure: Insertion of Non-tunneled Central Venous Catheter(36556) with US guidance (40375)   Indication(s) Medication administration  Consent Unable to obtain consent due to emergent nature of procedure.  Anesthesia non  Timeout Verified patient identification, verified procedure, site/side was marked, verified correct patient position, special equipment/implants available, medications/allergies/relevant history reviewed, required imaging and test results available.  Sterile Technique Maximal sterile technique including full sterile barrier drape, hand hygiene, sterile gown, sterile gloves, mask, hair covering, sterile ultrasound probe cover (if used).  Procedure Description Area of catheter insertion was cleaned with chlorhexidine and draped in sterile fashion.  With real-time ultrasound guidance a central venous catheter was placed into the right femoral vein. Nonpulsatile blood flow and easy flushing noted in all ports.  The catheter was sutured in place and sterile dressing applied.  Complications/Tolerance None; patient tolerated the procedure well. Chest X-ray is ordered to verify placement for internal jugular or subclavian cannulation.   Chest x-ray is not ordered for femoral cannulation.  EBL Minimal  Specimen(s) None

## 2019-09-29 NOTE — Progress Notes (Signed)
Name: Felicia [name] MRN: [id number] DOB: [birth year]    ADMISSION DATE:  08/30/2019 CONSULTATION DATE:  09/29/2019 REFERRING MD :  Delphia Grates, [facility] CHIEF COMPLAINT: Respiratory distress, worsening hypoxia  BRIEF HISTORY:  49 yo female with fibrotic NSIP and severe pulmonary hypertension on 10 to 15 liters oxygen, right sided heart failure IDDM, Hx of DVT on eliquis  presented with worsening hypoxia.  She decompensated on 2W on 8/22 , and while awaiting an ICU bed assignment ,  a dose of morphine and ativan, she had a primary respiratory arrest. She was intubated and was being transported to ICU when she cardiac arrested.  CPR was initiated in route and there was ROSC after 12 minutes of intermittent interventions. She is in the ICU on mechanical ventilation, and Levophed and EPI gtt's . She progressed to SVT and Amio bolus and drip were initiated. PCCM will admit to ICU and manage care   PAST MEDICAL HISTORY:  NSIP, Pulmonary Hypertension, Chronic hypoxic respiratory failure, DVT popliteal vein April 2021, PNA, Shingles  SIGNIFICANT EVENTS:  7/27 admit 7/28 PCCM consult 7/30 palliative care consult. Patient remains very steadfast in hoping for ongoing pulmonary improvement  8/02 slight decrease in O2 support  8/06 Afib with RVR and hypotension, txfr to ICU 8/13 breathing is a little bit more comfortable 8/14 breathing seems to be stable 8/22 Decompensated >> Intubated / Respiratory leading to cardiac arrest with ROSC after 12 minutes  CONSULTS:  Cardiology >> s/o 8/10 Palliative care  PROCEDURES:  Rt PICC 8/03 >>  ETT 8/22>> R Femoral A Line 8/22>> R Femoral CVC 09/28/2020>>  SIGNIFICANT STUDIES:   Echocardiogram 04/2019 >> normal LV function, RVSP 64 , enlarged RV, bubble study negative.  PFTs 02/2017 >> FVC 1.25 L 36% predicted, FEV1 1.18 L 40% predicted with DLCO of 17   HRCT 02/2019 >> chronic fibrotic ILD progressed from 2018 with mild focus of honeycombing  Cath 3/ 2021  >> right atrial pressure was 7, PA pressure 60/24 with a mean of 39 and a wedge of 10 her cardiac index was low at 1.91 and output of 3.89. Her PVR is thus 7.  Echo 09/05/19 >> EF 50 to 55%, severe RV systolic dysfx, RVSP 40.9, mod/severe TR  CT chest 09/06/19 >> patchy GGO, interstitial coarsening and traction BTX with basilar predominance, no change compared to December 2018  MICRO:  7/28 BCx2 >> negative 7/28 UC>> multiple species  09/29/2019>> Sputum Cx>> 09/29/2019 >> UA>>  ANTIBIOTICS:  Cefepime 7/27-7/28 Vancomycin 7/27-7/28 Doxycycline 8/03-8/07 Vanc 09/28/2020>> Zosyn 09/29/2019>>  SUBJECTIVE:  Worsening hypoxemia and respiratory arrest leading to cardiac arrest  OBJECTIVE:   VITAL SIGNS: Temp:  [97.6 F (36.4 C)-99.1 F (37.3 C)] 99.1 F (37.3 C) (08/22 1129) Pulse Rate:  [80-131] 128 (08/22 1129) Resp:  [24-44] 44 (08/22 1129) BP: (87-114)/(42-97) 111/84 (08/22 1129) SpO2:  [86 %-96 %] 91 % (08/22 1129) FiO2 (%):  [100 %] 100 % (08/22 0713)  Physical Exam: General: Obese female sedated and intubated,  HEENT:ETT is secure and intact,  No JVD or lymphadenopathy  Neuro: Sedated and intubated, was A&O x 3, MAE x 4 prior to respiratory arrest CV: S1, S2, SVT, No RMG PULM: Bilateral breath sounds, Coarse throughout with crackles GI: soft, bsx4 active , frequent stools Extremities: Cool to touch, no obvious deformities Skin: No rash or lesions, excoriated skin to peri area    RESOLVED PROBLEMS:    ASSESSMENT/PLAN:   Acute on chronic hypoxic respiratory failure. Severe pulmonary hypertension group 1,2,3  de to respiratory and cardiac arrest 09/29/2019. Patient with a slow recovery from exacerbation of underlying ILD and decompensated pulmonary hypertension. Plan  Wean FiO2 as able  Trend CXR VAP precautions Tracheal aspirate ABG now and prn  Continue home dose CellCept Prophylaxis with Bactrim Add Vanc and Zosyn for broad spectrum coverage Increase Solumedrol  to 80 mg BID  Atrial fibrillation with RVR Cardiac Arrest 2/2 Respiratory Distress  Hypotension / shock post arrest SVT post respiratory distress On Eliquis for previous DVT Plan Titrate Levophed for MAP of > 65 Epi drip, wean as able Amio gtt with load Last dose Eliquis 8/22 at 10 am Will transition to Heparin per Pharmacy Stat Limited Echo to evaluate RV 12 Lead EKG now and in am  Trend Mag and K with goals of > 2 and > 4 respectively Nitrous oxide 20 ppm initiated Lactate , Mag and calcium now  Leukocytosis Plan Trend fever curve and WBC Follow micro ABX as ordered  UA Culture as is clinically indicated  Group 1, 2, 3 pulmonary hypertension. Plan Continue current treatment plan  Euvolemia Wean FiO2 if possible Nitrous oxide initiated at 20 ppb  DM type 2 with steroid induced hyperglycemia. Plan CBG Q 4 SSI  Hx of depression. Plan Continue Lexapro per OG tube  Deconditioning. Plan PT/OT when appropriate  Pressure injury. Excoriated Peri area Wound care consult  Discussion .   Dr. Tamala Julian has discussed the progression of Felicia [name]'s Severe Lung Disease with her multiple times. Goals of care discussions have initiated, and Felicia [name] wanted to be intubated despite the low probability of liberation from mechanical ventilation and the high probability of tracheostomy. Pt. Daughter and sister have been notified of the events of the last hour by Dr. Tamala Julian.  Magdalen Spatz, MSN, AGACNP-BC [place] for personal pager PCCM on call pager [phone] 09/29/2019, 1:23 PM

## 2019-09-29 NOTE — Progress Notes (Signed)
Pharmacy Antibiotic Note  Felicia [name] is a 49 y.o. female with respiratory distress and was transferred now on mechanical ventilation. Pharmacy consulted to    start vancomycin and zosyn for suspected bacteremia -WBC= 11.9, afebrile, SCr= 0.9  Plan: -vancomycin 1753m followed by 10025mIV q12h -Zosyn 3.375gm IV q8h -Will follow renal function, cultures and clinical progress   Height: _0  (165.1 cm) Weight: 87 kg (191 lb 12.8 oz) IBW/kg (Calculated) : 57  Temp (24hrs), Avg:98.2 F (36.8 C), Min:97.6 F (36.4 C), Max:99.1 F (37.3 C)  Recent Labs  Lab 09/25/19 2320 09/28/19 1102  WBC 11.7*  --   CREATININE 0.80 0.93    Estimated Creatinine Clearance: 79.7 mL/min (by C-G formula based on SCr of 0.93 mg/dL).    No Active Allergies  Antimicrobials this admission: 7/28 vanc>>7/29 7/28 cefepime>>7/29 7/29 doxy x1, 8/3>>8/7 8/22 vanc>>  8/22 zosyn>>  Dose adjustments this admission:   Microbiology results:   Thank you for allowing pharmacy to be a part of this patient's care.  AnHildred LaserPharmD Clinical Pharmacist **Pharmacist phone directory can now be found on amBoodyom (PW [email or web address]).  Listed under [facility]

## 2019-09-29 NOTE — Procedures (Signed)
Arterial Catheter Insertion Procedure Note  Felicia [name]  [id number]  1970-07-27  Date:09/29/19  Time:1:27 PM    Provider Performing: Candee Furbish    Procedure: Insertion of Arterial Line [phone]) without US guidance  Indication(s) Blood pressure monitoring and/or need for frequent ABGs  Consent Unable to obtain consent due to emergent nature of procedure.  Anesthesia None   Time Out Verified patient identification, verified procedure, site/side was marked, verified correct patient position, special equipment/implants available, medications/allergies/relevant history reviewed, required imaging and test results available.   Sterile Technique Maximal sterile technique including full sterile barrier drape, hand hygiene, sterile gown, sterile gloves, mask, hair covering, sterile ultrasound probe cover (if used).   Procedure Description Area of catheter insertion was cleaned with chlorhexidine and draped in sterile fashion. Without real-time ultrasound guidance an arterial catheter was placed into the right femoral artery.  Appropriate arterial tracings confirmed on monitor.     Complications/Tolerance None; patient tolerated the procedure well.   EBL Minimal   Specimen(s) None

## 2019-09-29 NOTE — Consult Note (Signed)
[facility] Nurse Consult Note: Reason for Consult: Consult for areas of skin breakdown in the perineal area, medial and posterior thighs from prolonged period of exposure to fecal incontinence (>6 hours). Edema and erythema accompany skin breakdown. Wound type: Moisture associated skin breakdown, IAD Pressure Injury POA: N/A Measurement: Largest area of partial thickness skin breakdown measures 3cm x 0.8cm x 0.1cm with bright red wound bed, moist, scant exudate. Other scattered partial thickness skin breakdown and one full thickness area measuring 0.4cm round x 0.2cm. Wound bed:As noted above Drainage (amount, consistency, odor) ASsted above Periwound: edematous, erythematous Dressing procedure/placement/frequency: Patient is on a mattress replacement with low air loss feature, is being turned as able and is lying upon a low CoF underpad (DermaTherapy) for skin health. I am adding bilateral heel pressure redistribution heel boots and topical care to the perineal area with a prescriptive cream, Dr. Lurline Del Butt Cream, a 1:1:1 hydrocortisone, lotrimin, and zinc oxide preparation.  [facility] nursing team will not follow, but will remain available to this patient, the nursing and medical teams.  Please re-consult if needed. Thanks, Maudie Flakes, MSN, RN, [place], Arther Abbott  Pager# [phone]

## 2019-09-29 NOTE — Consult Note (Signed)
NEUROLOGY CONSULTATION NOTE   Date of service: September 29, 2019 Patient Name: Felicia [name] MRN:  [id number] DOB:  30-Nov-1970 Reason for consult: "myoclonic jerks"  History of Present Illness  Felicia [name] is a 49 y.o. female with PMH significant for DM2, HTN, End stage Interstitial lung disease with cor pulmonale, Pulm HTN, SVT, hx of DVT on Eliquis (last dose in AM on 8/22) who was admitted to the hospital with worsening hypoxia. She decompensated on 8/22 in the early afternoon and had a respiratory arrest. She was intubated and had a cardiac arrest enroute to the ICU. Initial rhythym was PEA/Asystole. CPR initiated with ROSC in 12 mins.  She was noted to be unresponsive after the episode and was not on any sedation on my initial evaluation.  Around 8pm, she was noted to have episodes of jerking concerning for myoclonus. On my initial presentation, patient noted to have multiple brief episodes of full body arrhythmic jerk lasting about a second. She would have these episodes about once every couple mins. She was noted to have these episodes despite no stimulation.  ROS   Unable to obtain. Patient is intubated.  Past History   Past Medical History:  Diagnosis Date  . Diabetes mellitus without complication ([place])    type 2   . Hypertension   . NSIP (nonspecific interstitial pneumonitis) ([place])   . Pulmonary hypertension ([place]) 2011  . Respiratory failure with hypoxia ([place]) 05/31/2019  . SVT (supraventricular tachycardia) ([facility])    Past Surgical History:  Procedure Laterality Date  . CESAREAN SECTION    . LUNG BIOPSY    . RIGHT HEART CATH N/A 04/26/2019   Procedure: RIGHT HEART CATH;  Surgeon: Larey Dresser, MD;  Location: [facility];  Service: Cardiovascular;  Laterality: N/A;   Family History  Problem Relation Age of Onset  . Hypertension Mother   . Cancer Father   . Diabetes Father   . Hypertension Father   . Diabetes Sister   . Mental retardation Sister    . Cancer Maternal Grandmother   . Cancer Maternal Grandfather   . Cancer Paternal Grandmother   . Cancer Paternal Grandfather    Social History   Socioeconomic History  . Marital status: Single    Spouse name: Not on file  . Number of children: Not on file  . Years of education: Not on file  . Highest education level: Not on file  Occupational History  . Not on file  Tobacco Use  . Smoking status: Never Smoker  . Smokeless tobacco: Never Used  . Tobacco comment: never smoked or used tobacco products  Vaping Use  . Vaping Use: Never used  Substance and Sexual Activity  . Alcohol use: No  . Drug use: No  . Sexual activity: Not on file  Other Topics Concern  . Not on file  Social History Narrative   One daugther.  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     Social Determinants of Health   Financial Resource Strain:   . Difficulty of Paying Living Expenses: Not on file  Food Insecurity:   . Worried About Charity fundraiser in the Last Year: Not on file  . Ran Out of Food in the Last Year: Not on file  Transportation Needs:   . Lack of Transportation (Medical): Not on file  . Lack of Transportation (Non-Medical): Not on file  Physical Activity:   . Days of Exercise per Week: Not on file  . Minutes of Exercise  per Session: Not on file  Stress:   . Feeling of Stress : Not on file  Social Connections:   . Frequency of Communication with Friends and Family: Not on file  . Frequency of Social Gatherings with Friends and Family: Not on file  . Attends Religious Services: Not on file  . Active Member of Clubs or Organizations: Not on file  . Attends Archivist Meetings: Not on file  . Marital Status: Not on file   No Active Allergies  Medications   Medications Prior to Admission  Medication Sig Dispense Refill Last Dose  . acetaminophen (TYLENOL) 500 MG tablet Take 2,000 mg by mouth as needed for moderate pain.    unk  . albuterol (PROVENTIL HFA;VENTOLIN HFA) 108 (90  Base) MCG/ACT inhaler Inhale 1 puff into the lungs every 6 (six) hours as needed for wheezing or shortness of breath.   unk  . apixaban (ELIQUIS) 5 MG TABS tablet Take 1 tablet (5 mg total) by mouth 2 (two) times daily. 60 tablet 5 08/27/2019 at 01000  . atenolol (TENORMIN) 50 MG tablet Take 75 mg by mouth 2 (two) times daily.    09/01/2019 at 01000  . calcium citrate (CALCITRATE - DOSED IN MG ELEMENTAL CALCIUM) 950 (200 Ca) MG tablet Take 200 mg of elemental calcium by mouth daily.   08/31/2019  . cholecalciferol (VITAMIN D3) 25 MCG (1000 UT) tablet Take 1,000 Units by mouth daily.   Past Week at Unknown time  . escitalopram (LEXAPRO) 10 MG tablet Take 10 mg by mouth daily.   08/30/2019 at Unknown time  . furosemide (LASIX) 20 MG tablet Take 3 tablets (60 mg total) by mouth 2 (two) times daily. 180 tablet 0 08/13/2019 at Unknown time  . guaiFENesin (MUCINEX) 600 MG 12 hr tablet Take 600 mg by mouth daily.    09/07/2019 at Unknown time  . ibuprofen (ADVIL) 200 MG tablet Take 400 mg by mouth every 6 (six) hours as needed for moderate pain.   Past Week at Unknown time  . insulin lispro (HUMALOG KWIKPEN) 100 UNIT/ML KwikPen Inject 0-20 Units into the skin 2 (two) times daily. Sliding scale based on sugar level   09/01/2019  . ipratropium-albuterol (DUONEB) 0.5-2.5 (3) MG/3ML SOLN Inhale 3 mLs into the lungs every 6 (six) hours as needed (sob and wheezing).    Past Week at Unknown time  . metFORMIN (GLUCOPHAGE-XR) 500 MG 24 hr tablet Take 1,000 mg by mouth at bedtime.    09/02/2019  . mycophenolate (CELLCEPT) 500 MG tablet Take 1,500 mg by mouth 2 (two) times daily.    09/02/2019 at Unknown time  . predniSONE (DELTASONE) 10 MG tablet TAKE 4 TABLETS BY MOUTH DAILY WITH BREAKFAST (Patient taking differently: Take 40 mg by mouth daily with breakfast. ) 120 tablet 1 08/09/2019 at Unknown time  . sildenafil (REVATIO) 20 MG tablet Take 20 mg by mouth 2 (two) times daily.   09/02/2019  . sulfamethoxazole-trimethoprim  (BACTRIM DS) 800-160 MG tablet Take 1 tablet by mouth 3 (three) times a week. As needed for sinus infection   unk  . verapamil (CALAN-SR) 120 MG CR tablet Take 1 tablet (120 mg total) by mouth at bedtime. 30 tablet 0 09/02/2019  . gabapentin (NEURONTIN) 300 MG capsule Take 1 capsule (300 mg total) by mouth 3 (three) times daily for 7 days. 21 capsule 0   . insulin degludec (TRESIBA FLEXTOUCH) 100 UNIT/ML FlexTouch Pen Inject 24 Units into the skin daily.   09/01/2019  .  LANTUS SOLOSTAR 100 UNIT/ML Solostar Pen Inject 30 Units into the skin at bedtime.     . potassium chloride SA (KLOR-CON) 20 MEQ tablet Take 2 tablets (40 mEq total) by mouth daily. 60 tablet 0      Vitals  Temp:  [96.6 F (35.9 C)-99.1 F (37.3 C)] 98.1 F (36.7 C) (08/22 1946) Pulse Rate:  [46-168] 90 (08/22 2100) Resp:  [16-49] 25 (08/22 2100) BP: (87-114)/(42-97) 97/85 (08/22 1230) SpO2:  [73 %-100 %] 100 % (08/22 2100) FiO2 (%):  [100 %] 100 % (08/22 2000)  Body mass index is 32.92 kg/m.  Physical Exam   General: Laying in bed; intubated. HENT: Normal oropharynx and mucosa. Normal external appearance of ears and nose. Neck: Supple, no pain or tenderness CV: No JVD. some peripheral edema. Pulmonary: Symmetric Chest rise. Breathing over vent. Abdomen: Soft to touch, non-tender Ext: No cyanosis, edema, or deformity  Skin: No rash. Normal palpation of skin. Musculoskeletal: Normal digits and nails by inspection. No clubbing.  Neurologic Examination(not on any sedation)  Mental status/Cognition: Intubated, noi response to voice or loud clap, no response to noxious stimuli.  Brainstem reflexes: Pupils: 16m BL , round and sluggish reaction to light. Occulocephalic relfex: positive. Corneal: + BL Cough: intact Gag: intact No grimace to pain  Motor and senory: No response to noxious stimuli, nares stimulation or moderate nailbed pressure in any extremities. Flaccid tone in all extremities.  Reflexes: 0 in all  extremities.   Labs   Lab Results  Component Value Date   NA 138 09/29/2019   K 3.6 09/29/2019   CL 101 09/29/2019   CO2 22 09/29/2019   GLUCOSE 224 (H) 09/29/2019   BUN 17 09/29/2019   CREATININE 1.21 (H) 09/29/2019   CALCIUM 8.1 (L) 09/29/2019   ALBUMIN 3.6 09/09/2019   AST 23 09/09/2019   ALT 56 (H) 09/09/2019   ALKPHOS 196 (H) 09/09/2019   BILITOT 1.2 09/09/2019   GFRNONAA 52 (L) 09/29/2019   GFRAA >60 09/29/2019     Imaging and Diagnostic studies  CTH ordered and is pending.  Impression   SCharlsey Moragneis a 49y.o. female with PMH significant for End stage Interstitial lung disease with cor pulmonale, Pulm HTN, hx of DVT on Eliquis (last dose in AM on 8/22) admitted with hypoxia. Worsening status and ended up intubated with inhospital cardiac arrest. Initial rhythym was PEA/Asystole, CPR for 12 mins with ROSC. Post arrest, poor responsiveness off sedation. Exam with brainstem reflexes intact and noted to have multiple brief all extremity jerking concerning for myoclonus, no evidence of higher cerebral function on my exam with no spontaneous purposeful movements, no response to voice/stimlation  Given her course, concern for hypoxic ischemic injury with secondary myoclonus vs myoclonic seizures/status, with her history of DVT on Eliquis, will need to rule out ICH. I did consider basilar thrombus but my overall suspicion is low, given ISTAT Chem with elevated Cr, hold off on CT Angio.  Recommendations  - Agree with STAT CTH - I ordered Ativan 285monce - Agree with Keppra load and propofol - I ordered Neuron Specific Enolase on day 1 and day 3 - I ordered continuous EEG and let the tech on call know. - I ordered ISTAT Chem. - Recommend MRI Brain without contrast after cEEG. - Avoid Hyperthermia, hyponatremia, hypovolemia. ______________________________________________________________________   Thank you for the opportunity to take part in the care of this patient.  If you have any further questions, please contact the neurology consultation attending.  Signed,  [place] Pager Number [phone]

## 2019-09-29 NOTE — Progress Notes (Signed)
[facility] Progress Note Patient Name: Felicia [name] DOB: Mar 15, 1970 MRN: [id number]   Date of Service  09/29/2019  HPI/Events of Note  Meisha RN. Frequent increasing jerks compared to earlier shift. 49 yr old SVT/a fib Cardiac arrest in AM, on Vent having frequent jerks. looks like myoclonic jerks. on 2 pressors. ILD/DVT/ afib/chr resp failure/cor pulmonale.   eICU Interventions  - get ABG, LA - asked bed side ICU PA/MD to visit her for EEG/CT head ?[name]  Not on sedation. No pupil or gag relux. On PCV.      Intervention Category Major Interventions: Other:  Elmer Sow 09/29/2019, 9:41 PM

## 2019-09-30 ENCOUNTER — Inpatient Hospital Stay (HOSPITAL_COMMUNITY): Payer: 59

## 2019-09-30 DIAGNOSIS — I50813 Acute on chronic right heart failure: Secondary | ICD-10-CM

## 2019-09-30 DIAGNOSIS — Z9911 Dependence on respirator [ventilator] status: Secondary | ICD-10-CM

## 2019-09-30 DIAGNOSIS — G931 Anoxic brain damage, not elsewhere classified: Secondary | ICD-10-CM

## 2019-09-30 DIAGNOSIS — R569 Unspecified convulsions: Secondary | ICD-10-CM

## 2019-09-30 LAB — BASIC METABOLIC PANEL
Anion gap: 18 — ABNORMAL HIGH (ref 5–15)
Anion gap: 19 — ABNORMAL HIGH (ref 5–15)
BUN: 30 mg/dL — ABNORMAL HIGH (ref 6–20)
BUN: 30 mg/dL — ABNORMAL HIGH (ref 6–20)
CO2: 20 mmol/L — ABNORMAL LOW (ref 22–32)
CO2: 20 mmol/L — ABNORMAL LOW (ref 22–32)
Calcium: 8 mg/dL — ABNORMAL LOW (ref 8.9–10.3)
Calcium: 8 mg/dL — ABNORMAL LOW (ref 8.9–10.3)
Chloride: 98 mmol/L (ref 98–111)
Chloride: 98 mmol/L (ref 98–111)
Creatinine, Ser: 1.82 mg/dL — ABNORMAL HIGH (ref 0.44–1.00)
Creatinine, Ser: 2 mg/dL — ABNORMAL HIGH (ref 0.44–1.00)
GFR calc Af Amer: 37 mL/min — ABNORMAL LOW (ref >60)
GFR calc Af Amer: 33 mL/min — ABNORMAL LOW (ref >60)
GFR calc non Af Amer: 32 mL/min — ABNORMAL LOW (ref >60)
GFR calc non Af Amer: 29 mL/min — ABNORMAL LOW (ref >60)
Glucose, Bld: 509 mg/dL (ref 70–99)
Glucose, Bld: 418 mg/dL — ABNORMAL HIGH (ref 70–99)
Potassium: 4.9 mmol/L (ref 3.5–5.1)
Potassium: 4.3 mmol/L (ref 3.5–5.1)
Sodium: 136 mmol/L (ref 135–145)
Sodium: 137 mmol/L (ref 135–145)

## 2019-09-30 LAB — CBC
HCT: 35.8 % — ABNORMAL LOW (ref 36.0–46.0)
Hemoglobin: 10.1 g/dL — ABNORMAL LOW (ref 12.0–15.0)
MCH: 24.4 pg — ABNORMAL LOW (ref 26.0–34.0)
MCHC: 28.2 g/dL — ABNORMAL LOW (ref 30.0–36.0)
MCV: 86.5 fL (ref 80.0–100.0)
Platelets: 274 10*3/uL (ref 150–400)
RBC: 4.14 MIL/uL (ref 3.87–5.11)
RDW: 19.7 % — ABNORMAL HIGH (ref 11.5–15.5)
WBC: 10.2 10*3/uL (ref 4.0–10.5)
nRBC: 1.5 % — ABNORMAL HIGH (ref 0.0–0.2)

## 2019-09-30 LAB — GLUCOSE, CAPILLARY
Glucose-Capillary: 117 mg/dL — ABNORMAL HIGH (ref 70–99)
Glucose-Capillary: 135 mg/dL — ABNORMAL HIGH (ref 70–99)
Glucose-Capillary: 143 mg/dL — ABNORMAL HIGH (ref 70–99)
Glucose-Capillary: 149 mg/dL — ABNORMAL HIGH (ref 70–99)
Glucose-Capillary: 153 mg/dL — ABNORMAL HIGH (ref 70–99)
Glucose-Capillary: 156 mg/dL — ABNORMAL HIGH (ref 70–99)
Glucose-Capillary: 161 mg/dL — ABNORMAL HIGH (ref 70–99)
Glucose-Capillary: 166 mg/dL — ABNORMAL HIGH (ref 70–99)
Glucose-Capillary: 168 mg/dL — ABNORMAL HIGH (ref 70–99)
Glucose-Capillary: 375 mg/dL — ABNORMAL HIGH (ref 70–99)
Glucose-Capillary: 390 mg/dL — ABNORMAL HIGH (ref 70–99)
Glucose-Capillary: 446 mg/dL — ABNORMAL HIGH (ref 70–99)

## 2019-09-30 LAB — MAGNESIUM: Magnesium: 1.8 mg/dL (ref 1.7–2.4)

## 2019-09-30 LAB — CALCIUM, IONIZED: Calcium, Ionized, Serum: 3.9 mg/dL — ABNORMAL LOW (ref 4.5–5.6)

## 2019-09-30 LAB — TRIGLYCERIDES: Triglycerides: 85 mg/dL (ref <150)

## 2019-09-30 LAB — AMMONIA: Ammonia: 33 umol/L (ref 9–35)

## 2019-09-30 LAB — PROTIME-INR
INR: 3.1 — ABNORMAL HIGH (ref 0.8–1.2)
Prothrombin Time: 31.1 seconds — ABNORMAL HIGH (ref 11.4–15.2)

## 2019-09-30 LAB — APTT
aPTT: >200 seconds (ref 24–36)
aPTT: >200 seconds (ref 24–36)
aPTT: 38 seconds — ABNORMAL HIGH (ref 24–36)

## 2019-09-30 LAB — HEPARIN LEVEL (UNFRACTIONATED)
Heparin Unfractionated: >2.2 IU/mL — ABNORMAL HIGH (ref 0.30–0.70)
Heparin Unfractionated: >2.2 IU/mL — ABNORMAL HIGH (ref 0.30–0.70)

## 2019-09-30 MED ORDER — VANCOMYCIN VARIABLE DOSE PER UNSTABLE RENAL FUNCTION (PHARMACIST DOSING): Status: DC

## 2019-09-30 MED ORDER — INSULIN REGULAR(HUMAN) IN NACL 100-0.9 UT/100ML-% IV SOLN
INTRAVENOUS | Status: DC
  Administered 2019-09-30: 16 [IU]/h via INTRAVENOUS
  Administered 2019-09-30: 3.2 [IU]/h via INTRAVENOUS
  Filled 2019-09-30 (×2): qty 100

## 2019-09-30 MED ORDER — VALPROATE SODIUM 500 MG/5ML IV SOLN
1500.0000 mg | Freq: Once | INTRAVENOUS | Status: AC
  Administered 2019-09-30: 1500 mg via INTRAVENOUS
  Filled 2019-09-30: qty 15

## 2019-09-30 MED ORDER — VALPROIC ACID 250 MG/5ML PO SOLN
400.0000 mg | Freq: Three times a day (TID) | ORAL | Status: DC
  Administered 2019-09-30 (×2): 400 mg
  Filled 2019-09-30 (×2): qty 10

## 2019-09-30 MED ORDER — HEPARIN (PORCINE) 25000 UT/250ML-% IV SOLN
800.0000 [IU]/h | INTRAVENOUS | Status: DC
  Administered 2019-09-30: 800 [IU]/h via INTRAVENOUS
  Filled 2019-09-30: qty 250

## 2019-09-30 MED ORDER — MIDAZOLAM HCL 2 MG/2ML IJ SOLN
INTRAMUSCULAR | Status: AC
  Filled 2019-09-30: qty 4

## 2019-09-30 MED ORDER — DEXTROSE 50 % IV SOLN: 0.0000 mL | INTRAVENOUS | Status: DC | PRN

## 2019-09-30 MED ORDER — SODIUM CHLORIDE 0.9% FLUSH: 10.0000 mL | INTRAVENOUS | Status: DC | PRN

## 2019-09-30 MED ORDER — MIDAZOLAM HCL 2 MG/2ML IJ SOLN
4.0000 mg | Freq: Once | INTRAMUSCULAR | Status: AC
  Administered 2019-09-30: 4 mg via INTRAVENOUS

## 2019-09-30 MED ORDER — VITAL AF 1.2 CAL PO LIQD
1000.0000 mL | ORAL | Status: DC
  Administered 2019-09-30: 1000 mL

## 2019-09-30 MED ORDER — CHLORHEXIDINE GLUCONATE 0.12% ORAL RINSE (MEDLINE KIT)
15.0000 mL | Freq: Two times a day (BID) | OROMUCOSAL | Status: DC
  Administered 2019-09-30 – 2019-10-01 (×3): 15 mL via OROMUCOSAL

## 2019-09-30 MED ORDER — ORAL CARE MOUTH RINSE
15.0000 mL | OROMUCOSAL | Status: DC
  Administered 2019-09-30 – 2019-10-02 (×15): 15 mL via OROMUCOSAL

## 2019-09-30 MED ORDER — SODIUM CHLORIDE 0.9 % IV SOLN
2.0000 g | Freq: Two times a day (BID) | INTRAVENOUS | Status: DC
  Administered 2019-09-30 – 2019-10-01 (×2): 2 g via INTRAVENOUS
  Filled 2019-09-30 (×3): qty 2

## 2019-09-30 MED ORDER — LIDOCAINE-EPINEPHRINE 0.5 %-1:200000 IJ SOLN
50.0000 mL | Freq: Once | INTRAMUSCULAR | Status: AC
  Administered 2019-09-30: 50 mL via INTRADERMAL
  Filled 2019-09-30: qty 50

## 2019-09-30 MED FILL — Medication: Qty: 1 | Status: AC

## 2019-09-30 NOTE — Care Plan (Signed)
Multiple pushbutton events were noted.  On video, patient was noted to have spontaneous eye opening.  Concomitant EEG showed generalized polyspikes consistent with myoclonic seizures.  Dr. Kerney Elbe was notified.  Please review final report for details.  Felicia [name]

## 2019-09-30 NOTE — Progress Notes (Signed)
Stat EEG completed, initiated LTM. No skin break down noted. Pt event button tested.  RN educated regarding such. Dr. Hortense Ramal and Dr Lorrin Goodell notified.

## 2019-09-30 NOTE — Progress Notes (Signed)
ANTICOAGULATION CONSULT NOTE  Pharmacy Consult for heparin  Indication: history of DVT  No Active Allergies  Patient Measurements: Height: _0  (162.6 cm) Weight: 82.1 kg (181 lb) IBW/kg (Calculated) : 54.7  Heparin dosing wt: 75kg    Vital Signs: BP: 93/63 (08/23 1102) Pulse Rate: 89 (08/23 1200)  Labs: Recent Labs    09/29/19 1430 09/29/19 1604 09/29/19 1839 09/29/19 2256 09/29/19 2256 09/29/19 2301 09/30/19 0500 09/30/19 0753 09/30/19 0758 09/30/19 0834 09/30/19 1411  HGB 11.4*   < >  --  12.6   < > 13.3 10.1*  --   --   --   --   HCT 40.8   < >  --  37.0  --  39.0 35.8*  --   --   --   --   PLT 238  --   --   --   --   --  274  --   --   --   --   APTT  --   --   --   --   --   --  >200*  --   --  >200* 38*  LABPROT  --   --   --   --   --   --   --   --  31.1*  --   --   INR  --   --   --   --   --   --   --   --  3.1*  --   --   HEPARINUNFRC  --   --   --   --   --   --  >2.20*  --   --  >2.20*  --   CREATININE 1.21*   < >  --   --   --  1.30* 1.82* 2.00*  --   --   --   TROPONINIHS 198*  --  373*  --   --   --   --   --   --   --   --    < > = values in this interval not displayed.    Estimated Creatinine Clearance: 35.3 mL/min (A) (by C-G formula based on SCr of 2 mg/dL (H)).   Medical History: Past Medical History:  Diagnosis Date  . Diabetes mellitus without complication ([place])    type 2   . Hypertension   . NSIP (nonspecific interstitial pneumonitis) ([place])   . Pulmonary hypertension ([place]) 2011  . Respiratory failure with hypoxia ([place]) 05/31/2019  . SVT (supraventricular tachycardia) ([facility])     Medications:  Medications Prior to Admission  Medication Sig Dispense Refill Last Dose  . acetaminophen (TYLENOL) 500 MG tablet Take 2,000 mg by mouth as needed for moderate pain.    unk  . albuterol (PROVENTIL HFA;VENTOLIN HFA) 108 (90 Base) MCG/ACT inhaler Inhale 1 puff into the lungs every 6 (six) hours as needed for wheezing or shortness of breath.    unk  . apixaban (ELIQUIS) 5 MG TABS tablet Take 1 tablet (5 mg total) by mouth 2 (two) times daily. 60 tablet 5 08/29/2019 at 01000  . atenolol (TENORMIN) 50 MG tablet Take 75 mg by mouth 2 (two) times daily.    09/07/2019 at 01000  . calcium citrate (CALCITRATE - DOSED IN MG ELEMENTAL CALCIUM) 950 (200 Ca) MG tablet Take 200 mg of elemental calcium by mouth daily.   08/31/2019  . cholecalciferol (VITAMIN D3) 25 MCG (1000 UT) tablet Take 1,000 Units  by mouth daily.   Past Week at Unknown time  . escitalopram (LEXAPRO) 10 MG tablet Take 10 mg by mouth daily.   08/19/2019 at Unknown time  . furosemide (LASIX) 20 MG tablet Take 3 tablets (60 mg total) by mouth 2 (two) times daily. 180 tablet 0 09/04/2019 at Unknown time  . guaiFENesin (MUCINEX) 600 MG 12 hr tablet Take 600 mg by mouth daily.    08/14/2019 at Unknown time  . ibuprofen (ADVIL) 200 MG tablet Take 400 mg by mouth every 6 (six) hours as needed for moderate pain.   Past Week at Unknown time  . insulin lispro (HUMALOG KWIKPEN) 100 UNIT/ML KwikPen Inject 0-20 Units into the skin 2 (two) times daily. Sliding scale based on sugar level   09/01/2019  . ipratropium-albuterol (DUONEB) 0.5-2.5 (3) MG/3ML SOLN Inhale 3 mLs into the lungs every 6 (six) hours as needed (sob and wheezing).    Past Week at Unknown time  . metFORMIN (GLUCOPHAGE-XR) 500 MG 24 hr tablet Take 1,000 mg by mouth at bedtime.    09/02/2019  . mycophenolate (CELLCEPT) 500 MG tablet Take 1,500 mg by mouth 2 (two) times daily.    08/17/2019 at Unknown time  . predniSONE (DELTASONE) 10 MG tablet TAKE 4 TABLETS BY MOUTH DAILY WITH BREAKFAST (Patient taking differently: Take 40 mg by mouth daily with breakfast. ) 120 tablet 1 08/19/2019 at Unknown time  . sildenafil (REVATIO) 20 MG tablet Take 20 mg by mouth 2 (two) times daily.   09/02/2019  . sulfamethoxazole-trimethoprim (BACTRIM DS) 800-160 MG tablet Take 1 tablet by mouth 3 (three) times a week. As needed for sinus infection   unk  .  verapamil (CALAN-SR) 120 MG CR tablet Take 1 tablet (120 mg total) by mouth at bedtime. 30 tablet 0 09/02/2019  . gabapentin (NEURONTIN) 300 MG capsule Take 1 capsule (300 mg total) by mouth 3 (three) times daily for 7 days. 21 capsule 0   . insulin degludec (TRESIBA FLEXTOUCH) 100 UNIT/ML FlexTouch Pen Inject 24 Units into the skin daily.   09/01/2019  . LANTUS SOLOSTAR 100 UNIT/ML Solostar Pen Inject 30 Units into the skin at bedtime.     . potassium chloride SA (KLOR-CON) 20 MEQ tablet Take 2 tablets (40 mEq total) by mouth daily. 60 tablet 0    Scheduled:  . Chlorhexidine Gluconate Cloth  6 each Topical Daily  . cholecalciferol  1,000 Units Per Tube Daily  . digoxin  0.125 mg Per Tube Daily  . docusate  100 mg Oral BID  . escitalopram  10 mg Per Tube Daily  . Gerhardt's butt cream   Topical QID  . lidocaine-EPINEPHrine  50 mL Intradermal Once  . mouth rinse  15 mL Mouth Rinse BID  . methylPREDNISolone (SOLU-MEDROL) injection  80 mg Intravenous Q12H  . midazolam      . midodrine  5 mg Per Tube TID WC  . multivitamin  15 mL Per Tube Daily  . nystatin  5 mL Oral QID  . pantoprazole (PROTONIX) IV  40 mg Intravenous Daily  . polyethylene glycol  17 g Per Tube BID  . senna-docusate  2 tablet Per Tube BID  . sildenafil  40 mg Per Tube TID  . sulfamethoxazole-trimethoprim  1 tablet Per Tube Once per day on Mon Wed Fri  . valproic acid  400 mg Per Tube TID  . vancomycin variable dose per unstable renal function (pharmacist dosing)   Does not apply See admin instructions  Assessment: 49 yo female s/p code blue with CPR and now VDRF. She is on apixaban for history of VTE. Pharmacy consulted to dose heparin while oral anticoagulation is on hold. -hg= 10.1, SCr= 0.9 > 2 -last apixaban dose ~ 10am today  APTT's elevated this AM on heparin at 1350 units/hr.  Drawn from central line, no known heparin previously infusing through line.  Heparin held until this afternoon, and now aPTT = 38, drawn  from peripheral stick.  Also with bleeding at site of central line, just injected with lidocaine/epi.  Discussed with Dr. Carlis Abbott, will keep off heparin for a bit longer.  Goal of Therapy:  Heparin level 0.3-0.7 units/ml aPTT 66-102 seconds Monitor platelets by anticoagulation protocol: Yes   Plan:   -Resume IV Heparin at 6 pm at lower rate of 800 units/hr. -Repeat aPTT 8 hrs after gtt resumes. -Daily heparin level, CBC, and aPTT.  [place] Crane, Roylene Reason, BCCP Clinical Pharmacist  09/30/2019 3:36 PM   [name] pharmacy phone numbers are listed on [email or web address]

## 2019-09-30 NOTE — Progress Notes (Signed)
LTM maintenance completed; no skin breakdown was seen; reprepped O1, O2, Pz, and P8

## 2019-09-30 NOTE — Progress Notes (Signed)
Patient ID: Felicia [name], female   DOB: 10-04-1970, 49 y.o.   MRN: [id number]  Placed call to daughter hoping to connect,  to schedule a time to meet in person to discuss patient's current medical situation.  Dramatic changes over the weekend, poor prognosis.  Await call back  No charge  Wadie Lessen NP  Palliative Medicine Team Team Phone # [phone] Pager [phone]

## 2019-09-30 NOTE — Progress Notes (Addendum)
Subjective: On propofol at a rate of 25 mcg/kg/min.  Objective: Current vital signs: BP 97/85   Pulse 86   Temp 98.1 F (36.7 C) (Axillary)   Resp (!) 22   Ht _0  (1.626 m)   Wt 82.1 kg   SpO2 100%   BMI 31.07 kg/m  Vital signs in last 24 hours: Temp:  [96.6 F (35.9 C)-99.1 F (37.3 C)] 98.1 F (36.7 C) (08/22 1946) Pulse Rate:  [46-168] 86 (08/23 0700) Resp:  [16-49] 22 (08/23 0700) BP: (97-111)/(42-85) 97/85 (08/22 1230) SpO2:  [73 %-100 %] 100 % (08/23 0700) FiO2 (%):  [100 %] 100 % (08/23 0335) Weight:  [82.1 kg] 82.1 kg (08/23 0500)  Intake/Output from previous day: 08/22 0701 - 08/23 0700 In: 2753.4 [P.O.:360; I.V.:1753.9; NG/GT:70; IV Piggyback:569.6] Out: 100 [Urine:100] Intake/Output this shift: No intake/output data recorded. Nutritional status:  Diet Order            Diet NPO time specified  Diet effective now                HEENT: [place]/AT. Proptosis noted.  Ext: Warm and well perfused  Ment: Intubated and sedated with propofol at a rate of 25. No response to voice or loud clap, no response to sternal rub or arm pinch. No spontaneous or reflexive movements noted. Brainstem reflexes:  Pupils: Round, 4 mm bilaterally and sluggishly reactive to light. Occulocephalic relfex: Absent Corneals: Trace on the right, absent on the left No grimace to pain Motor/Sensory: No response to noxious stimuli. Flaccid tone in all extremities. Reflexes: 0 in all extremities.  Lab Results: Results for orders placed or performed during the hospital encounter of 08/14/2019 (from the past 48 hour(s))  Glucose, capillary     Status: Abnormal   Collection Time: 09/28/19  8:13 AM  Result Value Ref Range   Glucose-Capillary 60 (L) 70 - 99 mg/dL    Comment: Glucose reference range applies only to samples taken after fasting for at least 8 hours.   Comment 1 Notify RN    Comment 2 Document in Chart   Glucose, capillary     Status: Abnormal   Collection Time: 09/28/19   8:42 AM  Result Value Ref Range   Glucose-Capillary 58 (L) 70 - 99 mg/dL    Comment: Glucose reference range applies only to samples taken after fasting for at least 8 hours.  Glucose, capillary     Status: None   Collection Time: 09/28/19  9:05 AM  Result Value Ref Range   Glucose-Capillary 73 70 - 99 mg/dL    Comment: Glucose reference range applies only to samples taken after fasting for at least 8 hours.  Glucose, capillary     Status: Abnormal   Collection Time: 09/28/19  9:48 AM  Result Value Ref Range   Glucose-Capillary 149 (H) 70 - 99 mg/dL    Comment: Glucose reference range applies only to samples taken after fasting for at least 8 hours.   Comment 1 Notify RN    Comment 2 Document in Chart   Basic metabolic panel     Status: Abnormal   Collection Time: 09/28/19 11:02 AM  Result Value Ref Range   Sodium 136 135 - 145 mmol/L   Potassium 4.3 3.5 - 5.1 mmol/L   Chloride 94 (L) 98 - 111 mmol/L   CO2 25 22 - 32 mmol/L   Glucose, Bld 190 (H) 70 - 99 mg/dL    Comment: Glucose reference range applies only to samples  taken after fasting for at least 8 hours.   BUN 19 6 - 20 mg/dL   Creatinine, Ser 0.93 0.44 - 1.00 mg/dL   Calcium 9.6 8.9 - 10.3 mg/dL   GFR calc non Af Amer >60 >60 mL/min   GFR calc Af Amer >60 >60 mL/min   Anion gap 17 (H) 5 - 15    Comment: Performed at [facility] [address], [place], [place] [address]  Glucose, capillary     Status: Abnormal   Collection Time: 09/28/19 12:17 PM  Result Value Ref Range   Glucose-Capillary 222 (H) 70 - 99 mg/dL    Comment: Glucose reference range applies only to samples taken after fasting for at least 8 hours.   Comment 1 Notify RN    Comment 2 Document in Chart   Glucose, capillary     Status: Abnormal   Collection Time: 09/28/19  4:40 PM  Result Value Ref Range   Glucose-Capillary 168 (H) 70 - 99 mg/dL    Comment: Glucose reference range applies only to samples taken after fasting for at least 8 hours.    Comment 1 Notify RN    Comment 2 Document in Chart   Glucose, capillary     Status: Abnormal   Collection Time: 09/28/19  9:30 PM  Result Value Ref Range   Glucose-Capillary 54 (L) 70 - 99 mg/dL    Comment: Glucose reference range applies only to samples taken after fasting for at least 8 hours.  Glucose, capillary     Status: Abnormal   Collection Time: 09/29/19  7:44 AM  Result Value Ref Range   Glucose-Capillary 34 (LL) 70 - 99 mg/dL    Comment: Glucose reference range applies only to samples taken after fasting for at least 8 hours.   Comment 1 Notify RN   Glucose, capillary     Status: Abnormal   Collection Time: 09/29/19  9:37 AM  Result Value Ref Range   Glucose-Capillary 50 (L) 70 - 99 mg/dL    Comment: Glucose reference range applies only to samples taken after fasting for at least 8 hours.  Glucose, capillary     Status: None   Collection Time: 09/29/19 10:57 AM  Result Value Ref Range   Glucose-Capillary 88 70 - 99 mg/dL    Comment: Glucose reference range applies only to samples taken after fasting for at least 8 hours.  I-STAT 7, (LYTES, BLD GAS, ICA, H+H)     Status: Abnormal   Collection Time: 09/29/19  1:15 PM  Result Value Ref Range   pH, Arterial 7.157 (LL) 7.35 - 7.45   pCO2 arterial 62.2 (H) 32 - 48 mmHg   pO2, Arterial 97 83 - 108 mmHg   Bicarbonate 22.0 20.0 - 28.0 mmol/L   TCO2 24 22 - 32 mmol/L   O2 Saturation 95.0 %   Acid-base deficit 7.0 (H) 0.0 - 2.0 mmol/L   Sodium 139 135 - 145 mmol/L   Potassium 3.5 3.5 - 5.1 mmol/L   Calcium, Ion 1.08 (L) 1.15 - 1.40 mmol/L   HCT 35.0 (L) 36 - 46 %   Hemoglobin 11.9 (L) 12.0 - 15.0 g/dL   Patient temperature 98.6 F    Collection site Magazine features editor by [employer]    Sample type ARTERIAL   Urinalysis, Routine w reflex microscopic Urine, Catheterized     Status: Abnormal   Collection Time: 09/29/19  1:37 PM  Result Value Ref Range   Color,  Urine AMBER (A) YELLOW    Comment: BIOCHEMICALS MAY BE AFFECTED BY COLOR    APPearance CLOUDY (A) CLEAR   Specific Gravity, Urine 1.025 1.005 - 1.030   pH 5.0 5.0 - 8.0   Glucose, UA NEGATIVE NEGATIVE mg/dL   Hgb urine dipstick MODERATE (A) NEGATIVE   Bilirubin Urine NEGATIVE NEGATIVE   Ketones, ur NEGATIVE NEGATIVE mg/dL   Protein, ur 100 (A) NEGATIVE mg/dL   Nitrite NEGATIVE NEGATIVE   Leukocytes,Ua LARGE (A) NEGATIVE   RBC / HPF 21-50 0 - 5 RBC/hpf   WBC, UA >50 (H) 0 - 5 WBC/hpf   Bacteria, UA NONE SEEN NONE SEEN   Squamous Epithelial / LPF 6-10 0 - 5   WBC Clumps PRESENT    Mucus PRESENT    Budding Yeast PRESENT     Comment: Performed at [facility] [address], [place], [place] [address]  CBC     Status: Abnormal   Collection Time: 09/29/19  2:30 PM  Result Value Ref Range   WBC 9.6 4.0 - 10.5 K/uL   RBC 4.57 3.87 - 5.11 MIL/uL   Hemoglobin 11.4 (L) 12.0 - 15.0 g/dL   HCT 40.8 36 - 46 %   MCV 89.3 80.0 - 100.0 fL   MCH 24.9 (L) 26.0 - 34.0 pg   MCHC 27.9 (L) 30.0 - 36.0 g/dL   RDW 20.1 (H) 11.5 - 15.5 %   Platelets 238 150 - 400 K/uL   nRBC 1.5 (H) 0.0 - 0.2 %    Comment: Performed at [facility] [address] [place], [place] [address]  Basic metabolic panel     Status: Abnormal   Collection Time: 09/29/19  2:30 PM  Result Value Ref Range   Sodium 145 135 - 145 mmol/L   Potassium 4.0 3.5 - 5.1 mmol/L   Chloride 101 98 - 111 mmol/L   CO2 22 22 - 32 mmol/L   Glucose, Bld 224 (H) 70 - 99 mg/dL    Comment: Glucose reference range applies only to samples taken after fasting for at least 8 hours.   BUN 17 6 - 20 mg/dL   Creatinine, Ser 1.21 (H) 0.44 - 1.00 mg/dL   Calcium 8.1 (L) 8.9 - 10.3 mg/dL   GFR calc non Af Amer 52 (L) >60 mL/min   GFR calc Af Amer >60 >60 mL/min   Anion gap 22 (H) 5 - 15    Comment: Performed at [facility] [address], [place], [place] [address]  Magnesium     Status: None   Collection Time: 09/29/19  2:30 PM  Result Value Ref Range   Magnesium 2.1 1.7 - 2.4 mg/dL    Comment:  Performed at [facility], [place] [address] [place], [place] [address]  Troponin I (High Sensitivity)     Status: Abnormal   Collection Time: 09/29/19  2:30 PM  Result Value Ref Range   Troponin I (High Sensitivity) 198 (HH) <18 ng/L    Comment: CRITICAL RESULT CALLED TO, READ BACK BY AND VERIFIED WITH: RN E MESSICK AT 1528 09/29/19 BY L BENFIELD (NOTE) Elevated high sensitivity troponin I (hsTnI) values and significant  changes across serial measurements may suggest ACS but many other  chronic and acute conditions are known to elevate hsTnI results.  Refer to the Links section for chest pain algorithms and additional  guidance. Performed at [facility], [place] [address], [place], [place] [address]  Glucose, capillary     Status: Abnormal   Collection Time: 09/29/19  3:07 PM  Result Value Ref Range   Glucose-Capillary 208 (H) 70 - 99 mg/dL    Comment: Glucose reference range applies only to samples taken after fasting for at least 8 hours.  I-STAT 7, (LYTES, BLD GAS, ICA, H+H)     Status: Abnormal   Collection Time: 09/29/19  4:04 PM  Result Value Ref Range   pH, Arterial 7.233 (L) 7.35 - 7.45   pCO2 arterial 53.5 (H) 32 - 48 mmHg   pO2, Arterial 269 (H) 83 - 108 mmHg   Bicarbonate 22.6 20.0 - 28.0 mmol/L   TCO2 24 22 - 32 mmol/L   O2 Saturation 100.0 %   Acid-base deficit 5.0 (H) 0.0 - 2.0 mmol/L   Sodium 138 135 - 145 mmol/L   Potassium 3.6 3.5 - 5.1 mmol/L   Calcium, Ion 0.99 (L) 1.15 - 1.40 mmol/L   HCT 36.0 36 - 46 %   Hemoglobin 12.2 12.0 - 15.0 g/dL   Patient temperature 98.6 F    Collection site Magazine features editor by Operator    Sample type ARTERIAL   Troponin I (High Sensitivity)     Status: Abnormal   Collection Time: 09/29/19  6:39 PM  Result Value Ref Range   Troponin I (High Sensitivity) 373 (HH) <18 ng/L    Comment: CRITICAL VALUE NOTED.  VALUE IS CONSISTENT WITH PREVIOUSLY REPORTED AND CALLED VALUE. (NOTE) Elevated high sensitivity troponin I  (hsTnI) values and significant  changes across serial measurements may suggest ACS but many other  chronic and acute conditions are known to elevate hsTnI results.  Refer to the Links section for chest pain algorithms and additional  guidance. Performed at [facility], [place] [address], [place], [place] [address]   Lactic acid, plasma     Status: Abnormal   Collection Time: 09/29/19  6:40 PM  Result Value Ref Range   Lactic Acid, Venous 7.5 (HH) 0.5 - 1.9 mmol/L    Comment: CRITICAL VALUE NOTED.  VALUE IS CONSISTENT WITH PREVIOUSLY REPORTED AND CALLED VALUE. Performed at [facility], [place] [address], [place], [place] [address]   Glucose, capillary     Status: Abnormal   Collection Time: 09/29/19  7:46 PM  Result Value Ref Range   Glucose-Capillary 290 (H) 70 - 99 mg/dL    Comment: Glucose reference range applies only to samples taken after fasting for at least 8 hours.  Lactic acid, plasma     Status: Abnormal   Collection Time: 09/29/19 10:30 PM  Result Value Ref Range   Lactic Acid, Venous 7.1 (HH) 0.5 - 1.9 mmol/L    Comment: CRITICAL RESULT CALLED TO, READ BACK BY AND VERIFIED WITH: RN M HAGANS _0  09/29/19 BY S GEZAHEGN Performed at [facility], [place] [address], [place], [place] [address]   I-STAT 7, (LYTES, BLD GAS, ICA, H+H)     Status: Abnormal   Collection Time: 09/29/19 10:56 PM  Result Value Ref Range   pH, Arterial 7.308 (L) 7.35 - 7.45   pCO2 arterial 48.2 (H) 32 - 48 mmHg   pO2, Arterial 195 (H) 83 - 108 mmHg   Bicarbonate 24.1 20.0 - 28.0 mmol/L   TCO2 26 22 - 32 mmol/L   O2 Saturation 100.0 %   Acid-base deficit 2.0 0.0 - 2.0 mmol/L   Sodium 138 135 - 145 mmol/L   Potassium 3.8 3.5 -  5.1 mmol/L   Calcium, Ion 1.06 (L) 1.15 - 1.40 mmol/L   HCT 37.0 36 - 46 %   Hemoglobin 12.6 12.0 - 15.0 g/dL   Patient temperature 98.9 F    Sample type ARTERIAL   I-STAT, chem 8     Status: Abnormal   Collection Time: 09/29/19 11:01 PM  Result Value Ref  Range   Sodium 140 135 - 145 mmol/L   Potassium 3.8 3.5 - 5.1 mmol/L   Chloride 99 98 - 111 mmol/L   BUN 25 (H) 6 - 20 mg/dL   Creatinine, Ser 1.30 (H) 0.44 - 1.00 mg/dL   Glucose, Bld 328 (H) 70 - 99 mg/dL    Comment: Glucose reference range applies only to samples taken after fasting for at least 8 hours.   Calcium, Ion 1.09 (L) 1.15 - 1.40 mmol/L   TCO2 22 22 - 32 mmol/L   Hemoglobin 13.3 12.0 - 15.0 g/dL   HCT 39.0 36 - 46 %  CBC     Status: Abnormal   Collection Time: 09/30/19  5:00 AM  Result Value Ref Range   WBC 10.2 4.0 - 10.5 K/uL   RBC 4.14 3.87 - 5.11 MIL/uL   Hemoglobin 10.1 (L) 12.0 - 15.0 g/dL   HCT 35.8 (L) 36 - 46 %   MCV 86.5 80.0 - 100.0 fL   MCH 24.4 (L) 26.0 - 34.0 pg   MCHC 28.2 (L) 30.0 - 36.0 g/dL   RDW 19.7 (H) 11.5 - 15.5 %   Platelets 274 150 - 400 K/uL   nRBC 1.5 (H) 0.0 - 0.2 %    Comment: Performed at [facility], [place] [address] [place], [place] [address]  Heparin level (unfractionated)     Status: Abnormal   Collection Time: 09/30/19  5:00 AM  Result Value Ref Range   Heparin Unfractionated >2.20 (H) 0.30 - 0.70 IU/mL    Comment: RESULTS CONFIRMED BY MANUAL DILUTION (NOTE) If heparin results are below expected values, and patient dosage has  been confirmed, suggest follow up testing of antithrombin III levels. Performed at [facility], [place] [address], [place], [place] [address]   APTT     Status: Abnormal   Collection Time: 09/30/19  5:00 AM  Result Value Ref Range   aPTT >200 (HH) 24 - 36 seconds    Comment: REPEATED TO VERIFY IF BASELINE aPTT IS ELEVATED, SUGGEST PATIENT RISK ASSESSMENT BE USED TO DETERMINE APPROPRIATE ANTICOAGULANT THERAPY. CRITICAL RESULT CALLED TO, READ BACK BY AND VERIFIED WITH: Ellin Mayhew, RN [phone] 09/30/2019 BY MACEDA,J. Performed at [facility], [place] [address], [place], [place] [address]   Magnesium     Status: None   Collection Time: 09/30/19  5:00 AM  Result Value Ref Range   Magnesium  1.8 1.7 - 2.4 mg/dL    Comment: Performed at [facility] [address] [place], [place] [address]  Basic metabolic panel     Status: Abnormal   Collection Time: 09/30/19  5:00 AM  Result Value Ref Range   Sodium 136 135 - 145 mmol/L   Potassium 4.9 3.5 - 5.1 mmol/L   Chloride 98 98 - 111 mmol/L   CO2 20 (L) 22 - 32 mmol/L   Glucose, Bld 509 (HH) 70 - 99 mg/dL    Comment: Glucose reference range applies only to samples taken after fasting for at least 8 hours. CRITICAL RESULT CALLED TO, READ BACK BY AND VERIFIED WITH: RN  D FLOYD _0  09/30/19 BY S GEZAHEGN    BUN 30 (H) 6 - 20 mg/dL   Creatinine, Ser 1.82 (H) 0.44 - 1.00 mg/dL   Calcium 8.0 (L) 8.9 - 10.3 mg/dL   GFR calc non Af Amer 32 (L) >60 mL/min   GFR calc Af Amer 37 (L) >60 mL/min   Anion gap 18 (H) 5 - 15    Comment: Performed at [facility] [address] [place], [place] [address]  Glucose, capillary     Status: Abnormal   Collection Time: 09/30/19  5:38 AM  Result Value Ref Range   Glucose-Capillary 390 (H) 70 - 99 mg/dL    Comment: Glucose reference range applies only to samples taken after fasting for at least 8 hours.  Glucose, capillary     Status: Abnormal   Collection Time: 09/30/19  7:21 AM  Result Value Ref Range   Glucose-Capillary 446 (H) 70 - 99 mg/dL    Comment: Glucose reference range applies only to samples taken after fasting for at least 8 hours.    Recent Results (from the past 240 hour(s))  Culture, Urine     Status: Abnormal   Collection Time: 09/26/19 10:59 AM   Specimen: Urine, Clean Catch  Result Value Ref Range Status   Specimen Description URINE, CLEAN CATCH  Final   Special Requests   Final    NONE Performed at [facility], [address] [place], [place] [address]    Culture MULTIPLE SPECIES PRESENT, SUGGEST RECOLLECTION (A)  Final   Report Status 09/27/2019 FINAL  Final    Lipid Panel No results for input(s): CHOL, TRIG, HDL, CHOLHDL, VLDL, LDLCALC in the  last 72 hours.  Studies/Results: DG CHEST PORT 1 VIEW  Result Date: 09/29/2019 CLINICAL DATA:  49 year old female status post intubation EXAM: PORTABLE CHEST 1 VIEW COMPARISON:  Prior chest x-ray 09/29/2019 at 8:14 a.m. FINDINGS: The patient has been intubated. The tip of the endotracheal tube is 5.2 cm above the carina. A gastric tube is present. The tip of the tube lies off the field of view, presumably within the stomach. An external defibrillator pad projects over the left chest. Cardiomegaly with pulmonary vascular congestion and diffuse bilateral interstitial prominence. Suspect layering left pleural effusion. No pneumothorax. Inspiratory volumes are low. IMPRESSION: 1. Interval intubation. The tip of the endotracheal tube is in good position 5.2 cm above the carina. 2. Nasogastric tube has been placed. The tip lies off the field of view, presumably within the stomach. 3. Cardiomegaly with diffuse bilateral interstitial airspace opacities. Suspect pulmonary edema superimposed on a background of known interstitial pneumonitis. Electronically Signed   By: Jacqulynn Cadet M.D.   On: 09/29/2019 13:48   DG CHEST PORT 1 VIEW  Result Date: 09/29/2019 CLINICAL DATA:  Acute on chronic respiratory failure with hypoxia EXAM: PORTABLE CHEST 1 VIEW COMPARISON:  09/20/2019 chest radiograph. FINDINGS: Low lung volumes. Stable cardiomediastinal silhouette with normal heart size. No pneumothorax. No pleural effusion. Diffuse patchy reticular and hazy opacities in both lungs appears similar to slightly worsened in the lower lungs. IMPRESSION: Low lung volumes. Diffuse patchy reticular and hazy opacities in both lungs appear similar to slightly worsened in the lower lungs. Favor chronic interstitial lung disease with superimposed atelectasis, although a superimposed aspiration or developing pneumonia in the lower left lung cannot be excluded. Electronically Signed   By: Ilona Sorrel M.D.   On: 09/29/2019 10:30    ECHOCARDIOGRAM LIMITED  Result Date: 09/29/2019  ECHOCARDIOGRAM LIMITED REPORT   Patient Name:   Felicia [name] Date of Exam: 09/29/2019 Medical Rec #:  [id number]          Height:       65.0 in Accession #:    [id number]         Weight:       191.8 lb Date of Birth:  [birth year]          BSA:          1.943 m Patient Age:    7 years           BP:           111/84 mmHg Patient Gender: F                  HR:           168 bpm. Exam Location:  Inpatient Procedure: Limited Echo Indications:    Cardiac Arrest  History:        Patient has prior history of Echocardiogram examinations, most                 recent 09/05/2019.  Sonographer:    Merrie Roof RDCS Referring Phys: [place] Comments: This was a limited to echo to show the attending doctor the right ventricle. IMPRESSIONS  1. Very limited study with only 3 images, no Doppler. LV appears small and underfilled with probably low normal LVEF 50-55%. RV is severely dilated with severe systolic dysfunction. Right atrium is also severely dilated, this hasn't changed from the prior study on 09/05/2019.  2. Left ventricular ejection fraction, by estimation, is 50 to 55%. The left ventricle has low normal function. There is the interventricular septum is flattened in systole and diastole, consistent with right ventricular pressure and volume overload.  3. Right ventricular systolic function is severely reduced. The right ventricular size is severely enlarged.  4. Right atrial size was severely dilated. FINDINGS  Left Ventricle: Left ventricular ejection fraction, by estimation, is 50 to 55%. The left ventricle has low normal function. The interventricular septum is flattened in systole and diastole, consistent with right ventricular pressure and volume overload. Right Ventricle: The right ventricular size is severely enlarged. Right ventricular systolic function is severely reduced. Left Atrium: Left atrial size was normal in size. Right Atrium:  Right atrial size was severely dilated. Ena Dawley MD Electronically signed by Ena Dawley MD Signature Date/Time: 09/29/2019/5:26:37 PM    Final     Medications:  Scheduled: . Chlorhexidine Gluconate Cloth  6 each Topical Daily  . cholecalciferol  1,000 Units Per Tube Daily  . digoxin  0.125 mg Per Tube Daily  . docusate  100 mg Oral BID  . escitalopram  10 mg Per Tube Daily  . feeding supplement (VITAL HIGH PROTEIN)  1,000 mL Per Tube Q24H  . Gerhardt's butt cream   Topical QID  . mouth rinse  15 mL Mouth Rinse BID  . methylPREDNISolone (SOLU-MEDROL) injection  80 mg Intravenous Q12H  . midodrine  5 mg Per Tube TID WC  . multivitamin  15 mL Per Tube Daily  . nystatin  5 mL Oral QID  . pantoprazole (PROTONIX) IV  40 mg Intravenous Daily  . polyethylene glycol  17 g Per Tube BID  . senna-docusate  2 tablet Per Tube BID  . sildenafil  40 mg Per Tube TID  . sulfamethoxazole-trimethoprim  1 tablet Per Tube Once per day on Mon Wed Fri  Continuous: . amiodarone 30 mg/hr (09/30/19 0700)  . epinephrine 5 mcg/min (09/30/19 0700)  . heparin Stopped (09/30/19 0646)  . insulin 16 Units/hr (09/30/19 0702)  . mycophenolate (CELLCEPT) IV 1,500 mg (09/29/19 2200)  . norepinephrine (LEVOPHED) Adult infusion 30 mcg/min (09/30/19 0700)  . piperacillin-tazobactam (ZOSYN)  IV 12.5 mL/hr at 09/30/19 0700  . propofol (DIPRIVAN) infusion 20 mcg/kg/min (09/30/19 0700)  . vancomycin Stopped (09/30/19 0383)    Assessment: 49 y.o. female with PMHx significant for end stage interstitial lung disease and pulmonary HTN with cor pulmonale, hx of DVT on Eliquis (last dose in AM on 8/22), who was admitted with hypoxia. She decompensated on 8/22 in the early afternoon and had a respiratory arrest. She was intubated and had a cardiac arrest enroute to the ICU. Initial rhythym was PEA/Asystole. CPR initiated with ROSC in 12 mins. Post arrest, poor responsiveness off sedation. Exam with brainstem reflexes  intact but was noted to have multiple brief all extremity jerking concerning for myoclonus, no evidence of higher cerebral function on initial neurological exam with no spontaneous purposeful movements, no response to voice/stimulation.  1. Overall presentation most consistent with hypoxic ischemic injury with secondary myoclonus. Per RN, no myoclonic jerks have been seen since last night.  2. With her history of DVT on Eliquis, will need to rule out ICH. Basilar thrombus has been considered, but overall suspicion is low; given ISTAT Chem with elevated Cr, hold off on CT Angio. 3. Was loaded with Keppra 2000 mg at 2230. Due to the patient's QT prolongation and risk for torsades de pointes, will switch to valproic acid. 4. LTM EEG report for this AM: This study is suggestive ofprofound diffuseencephalopathy, nonspecific etiology but likely related to sedation, anoxic/hypoxic brain injury.No seizures ordefiniteepileptiform discharges were seen throughout the recording.  Recommendations: 1. Avoid hyperthermia, hyponatremia and hypovolemia  2. Continue LTM EEG. If no electrographic seizures or status myoclonus for several hours after discontinuation of propofol, then can discontinue LTM.  3. Load valproic acid 1500 mg IV x 1, then continue at scheduled dose of 5 mg/kg per tube TID (ordered) 4. Continue to monitor LFTs 5. Baseline ammonia level (ordered) 6. STAT CT head is pending 7. Neuron specific enolase for day 1 and day 3 to assist with prognostication 8. MRI brain after EEG  40 minutes spent in the neurological evaluation and management of this critically ill patient.   Addendum:  - Propofol stopped at about 11:30 AM.  - Patient had 2 episodes on myoclonic jerking at about 3:00 PM. Electrographic correlate was present on EEG - Repeat exam off propofol: No evidence for cortical function, unchanged from AM exam. Pupils 6 mm and round, sluggishly reactive to 4 mm bilaterally. No doll's eye  reflex. Weak right corneal reflex, absent left corneal. No grimace to noxious. Overbreathes the vent and coughs, but with no gag reflex. Flaccid extremities x 4 with no movement to noxious stimuli. No myoclonic jerking noted at time of exam.  - Propofol restarted at a rate of 25.   10 additional minutes of ICU time.   LOS: 27 days   _0  signed: Dr. Kerney Elbe 09/30/2019  7:28 AM

## 2019-09-30 NOTE — Progress Notes (Signed)
Manufacturing engineer [facility]) Community Based Palliative Care       This patient is enrolled in our palliative care services in the community.  [facility] will continue to follow for any discharge planning needs and to coordinate continuation of palliative care.   If you have questions or need assistance, please call [phone] or contact the hospital Liaison listed on AMION.     Thank you for the opportunity to participate in this patient's care.     Domenic Moras, BSN, RN [facility] Liaison   [phone]

## 2019-09-30 NOTE — Procedures (Signed)
Patient Name: Felicia [name]  MRN: [id number]  Epilepsy Attending: Lora Havens  Referring Physician/Provider: Dr Donnetta Simpers Date: 09/29/2019 Duration: 22.50 mins  Patient history: 49yo F s/o cardiac arrest. EEG to evaluate for seizure  Level of alertness:  comatose  AEDs during EEG study: propofol  Technical aspects: This EEG study was done with scalp electrodes positioned according to the 10-20 International system of electrode placement. Electrical activity was acquired at a sampling rate of _0  and reviewed with a high frequency filter of _1  and a low frequency filter of _2 . EEG data were recorded continuously and digitally stored.   Description: EEG showed bursts suppression with 2-3 seconds of suppression alternating with 1 second of 3-_3  theta-delta slowing which at times appeared sharply contoured. Hyperventilation and photic stimulation were not performed.     ABNORMALITY -Burst suppression, generalized  IMPRESSION: This study is suggestive of profound diffuse encephalopathy, nonspecific etiology but likely related to sedation, anoxic/hypoxic brain injury. No seizures or definite epileptiform discharges were seen throughout the recording.  Felicia [name]

## 2019-09-30 NOTE — Progress Notes (Addendum)
NAME:  Felicia [name], MRN:  [id number], DOB:  [birth year], LOS: 39 ADMISSION DATE:  08/25/2019, CONSULTATION DATE: 09/29/2019 REFERRING MD:  Delphia Grates, MD CHIEF COMPLAINT:  Respiratory distress, worsening hypoxia   Brief History   49 year old female with PMHx of fibrotic NSIP and severe pulmonary hypertension on 10-15L O2 at baseline, right sided heart failure, IDDM, and Hx of DVT on Eliquis presented with worsening hypoxia. Acutely decompensated on 8/22 with respiratory arrest for which intubated. Following this, had cardiac arrest while being transported to ICU s/p CPR with ROSC after 12 minutes of intermittent interventions. Currently in ICU on mechanical ventilation, levophed and epinephrine drips and amiodarone gtt for SVT.   Past Medical History   Past Medical History:  Diagnosis Date  . Diabetes mellitus without complication ([place])    type 2   . Hypertension   . NSIP (nonspecific interstitial pneumonitis) ([place])   . Pulmonary hypertension ([place]) 2011  . Respiratory failure with hypoxia ([place]) 05/31/2019  . SVT (supraventricular tachycardia) ([place])    Significant Hospital Events   7/27 > admitted 7/28 > PCCM consulted 7/30 > palliative care consulted. Patient remains steadfast in hoping for ongoing pulmonary improvement 8/2 > slight decrease in O2 support 8/6 >  A fib with RVR and hypotension, transferred to ICU 8/22 > intubation for hypoxia; cardiac arrest 2/2 respiratory arrest with ROSC after 12 minutes    Consults:  Palliative care Neurology  Procedures:  Rt PICC 8/3 >>  ETT 8/22 >> R femoral A line 8/22 >> R femoral CVC 8/22 >>   Significant Diagnostic Tests:   Echocardiogram 04/2019 >> normal LV function, RVSP 64 , enlarged RV, bubble study negative.  PFTs 02/2017 >> FVC 1.25 L 36% predicted, FEV1 1.18 L 40% predicted with DLCO of 17   HRCT 02/2019 >> chronic fibrotic ILD progressed from 2018 with mild focus of honeycombing  Cath 3/ 2021 >> right atrial pressure was  7, PA pressure 60/24 with a mean of 39 and a wedge of 10 her cardiac index was low at 1.91 and output of 3.89. Her PVR is thus 7.  Echo 09/05/19 >> EF 50 to 55%, severe RV systolic dysfx, RVSP 78.9, mod/severe TR  CT chest 09/06/19 >> patchy GGO, interstitial coarsening and traction BTX with basilar predominance, no change compared to December 2018  CXR 09/29/2019 >> Low lung volumes. Diffuse patchy reticular and hazy opacities in both lungs appear similar to slightly worsened in lower lungs; favor chronic interstitial lung disease with superimposed atelectasis; although a superimposed aspiration or developing pneumonia in the lower left lung cannot be excluded.   CXR 09/29/2019 >> interval intubation with ETT in place; NGT in place; cardiomegaly with diffuse bilateral interstitial airspace opacities - pulmonary edema superimposed on background of known interstitial pneumonitis    Micro Data:  SARS CoV-2 > Negative Blood Cx > Negative Urine Cx 7/28 > Negative MRSA screen > negative  Urine Cx 8/19 > Negative   Antimicrobials:  Bactrim 8/17>8/20, 8/23> Zosyn 8/22 Vancomycin 8/22   Interim history/subjective:  On minimal sedation; currently unresponsive. Remains on ventilator support.   Objective   Blood pressure 93/63, pulse 89, temperature 98.1 F (36.7 C), temperature source Axillary, resp. rate 16, height _0  (1.626 m), weight 82.1 kg, SpO2 100 %.    Vent Mode: PRVC FiO2 (%):  [100 %] 100 % Set Rate:  [28 bmp] 28 bmp Vt Set:  [440 mL] 440 mL PEEP:  [14 cmH20] 14 cmH20 Plateau Pressure:  [28  cmH20-40 cmH20] 28 cmH20   Intake/Output Summary (Last 24 hours) at 09/30/2019 1205 Last data filed at 09/30/2019 1200 Gross per 24 hour  Intake 3111.02 ml  Output 120 ml  Net 2991.02 ml   Filed Weights   09/22/19 0411 09/23/19 0423 09/30/19 0500  Weight: 82.4 kg 87 kg 82.1 kg    Examination: General: critically ill appearing obese female, unresponsive on minimal sedation;  intubated HENT: [place]/AT, ETT in place, pupils dilated and unreactive; MMM; anicteric sclerae Lungs: bilateral rales  Cardiovascular: S1 and S2 present; no m/r/g Abdomen: soft, nondistended; +bowel sounds Extremities: warm and dry; distal pulses intact Neuro: unresponsive to painful stimuli; pupils dilated and unreactive; no corneal or gag reflex  Resolved Hospital Problem list    Assessment & Plan:  Acute on chronic hypoxic respiratory failure  Cardiac arrest secondary to respiratory failure  Currently on  full vent support with broad spectrum antibiotics and high dose steroids; on levophed for pressor support Also on continuous nitrous oxide  - Continue mechanical ventilation; TV 4-8cc/kg, SpO2 >90% - Wean FiO2 as able based on ABG's - Continue broad spectrum coverage with vancomycin, zosyn and bactrim - Continue solumedrol 53m bid - VAP per protocol - Continue CellcCept home dosing  Acute toxic metabolic encephalopathy S/p cardiac arrest; currently off sedation and EEG suggestive of diffuse encephalopathy 2/2 anoxic/hypoxic brain injury without seizures/epileptiform discharges.  - Continue EEG monitoring - CT Head wo Contrast as able for neuroprognostication - Valproic acid per neurology  Atrial fibrillation with RVR  SVT s/p respiratory distress  - Continue amiodarone and epi gtt, wean as able  - Heparin per pharmacy dosing  - K > 4, Mag >2  Severe fibrotic NSIP  Group 1,2,3 pulmonary hypertension - Maintain euvolemia and wean FiO2 as able - Nitrous oxide   DM II with steroid induced hyperglycemia - Currently on insulin gtt with improvement in hyperglycemia - Will wean off insulin gtt as able for goal CBG <180.   Oligouric acute renal failure Worsening creatinine in setting of recent cardiac arrest and shock with minimal urine output - Trend renal function  - Maintain MAP >65 for optimization of renal perfusion  Best practice:  Diet: tube  feeds Pain/Anxiety/Delirium protocol (if indicated): per protocol VAP protocol (if indicated): per protocol DVT prophylaxis: SCDs GI prophylaxis: PPI Glucose control: insulin gtt Mobility: bed rest Code Status: FULL Family Communication: patient's daughter updated at bedside  Disposition: ICU  Critical care time: 35 minutes    SHarvie Heck MD Internal Medicine, PGY-2 09/30/19 3:25 PM Pager # [phone]

## 2019-09-30 NOTE — Progress Notes (Signed)
Nutrition Follow-up  DOCUMENTATION CODES:   Not applicable  INTERVENTION:   Tube Feeding:  Vital AF 1.2 at 60 ml/hr Provides 1728 kcals, 108 g of protein and 1166 mL of free water Meets 100% estimated calorie and protein needs  TF regimen and propofol at current rate providing 1939 total kcal/day   NUTRITION DIAGNOSIS:   Increased nutrient needs related to wound healing as evidenced by estimated needs.  Being addressed via TF   GOAL:   Patient will meet greater than or equal to 90% of their needs  Progressing  MONITOR:   PO intake, Supplement acceptance, Weight trends, Labs, I & O's  REASON FOR ASSESSMENT:   LOS    ASSESSMENT:   Patient with PMH significant for nonspecific interstitial pneumonitis, DM, pulmonary HTN, CHF, and chronic 10-15 L/min supplemental O2 requirement. Presents this admission with acute on chronic respiratory failure.  7/27 Admitted  8/22 Intubated, Cardiac Arrest post Intubation  Pt on vent support, requiring levophed and epinephrine.  MV: 13 L/min Temp (24hrs), Avg:98.1 F (36.7 C), Min:98.1 F (36.7 C), Max:98.1 F (36.7 C)  Propofol: 8 ml/hr  Concern for anoxic injury with seizures; neurology following  Noted trickle TF initiated yesterday   CBGs very high, started on insulin drip  Labs: CBGs 375-446, Creatinine 2.00, BUN 30 Meds: insulin drip, solumedrol, cellcept   Diet Order:   Diet Order            Diet NPO time specified  Diet effective now                 EDUCATION NEEDS:   Not appropriate for education at this time  Skin:  Skin Assessment: Skin Integrity Issues: Skin Integrity Issues:: Stage II Stage II: L buttocks  Last BM:  8/22  Height:   Ht Readings from Last 1 Encounters:  09/29/19 _0  (1.626 m)    Weight:   Wt Readings from Last 1 Encounters:  09/30/19 82.1 kg   BMI:  Body mass index is 31.07 kg/m.  Estimated Nutritional Needs:   Kcal:  1745 kcals  Protein:  105-125 g  Fluid:   >/= 2 L   Kerman Passey MS, RDN, LDN, CNSC Registered Dietitian III Clinical Nutrition RD Pager and On-Call Pager Number Located in [place]

## 2019-09-30 NOTE — Progress Notes (Signed)
OT Cancellation Note  Patient Details Name: Felicia [name] MRN: [id number] DOB: 16-Jan-1971   Cancelled Treatment:    Reason Eval/Treat Not Completed: Medical issues which prohibited therapy; pt with recent respiratory and cardiac arrest, now on continuous EEG. Acute OT to sign off. Please re-order when pt is appropriate for therapies.   Lou Cal, OT Acute Rehabilitation Services Pager [phone] Office [phone]   Raymondo Band 09/30/2019, 8:20 AM

## 2019-09-30 NOTE — Progress Notes (Signed)
PT Cancellation Note  Patient Details Name: Felicia [name] MRN: [id number] DOB: 18-Apr-1970   Cancelled Treatment:    Reason Eval/Treat Not Completed: Medical issues which prohibited therapy (pt with respiratory and cardiac arrest currently with EEG and not appropriate. Will sign off. Please reorder as pt appropriate for therapy)   [name]  [name] 09/30/2019, 7:40 AM  [place] Pager: [phone] Office: [phone]

## 2019-09-30 NOTE — Progress Notes (Signed)
eLink Physician-Brief Progress Note Patient Name: Felicia [name] DOB: September 26, 1970 MRN: [id number]   Date of Service  09/30/2019  HPI/Events of Note  Hyperglycemia  eICU Interventions  Start IV insulin Repeat BMP in 4 hours, may need frequent checks if remains on the drip      Intervention Category Major Interventions: Hyperglycemia - active titration of insulin therapy  Margaretmary Lombard 09/30/2019, 5:59 AM

## 2019-09-30 NOTE — Plan of Care (Signed)
  Problem: Clinical Measurements: Goal: Will remain free from infection Outcome: Progressing   Problem: Nutrition: Goal: Adequate nutrition will be maintained Outcome: Progressing   Problem: Coping: Goal: Level of anxiety will decrease Outcome: Progressing   Problem: Elimination: Goal: Will not experience complications related to bowel motility Outcome: Progressing   Problem: Pain Managment: Goal: General experience of comfort will improve Outcome: Progressing   Problem: Safety: Goal: Ability to remain free from injury will improve Outcome: Progressing   Problem: Education: Goal: Knowledge of General Education information will improve Description: Including pain rating scale, medication(s)/side effects and non-pharmacologic comfort measures Outcome: Not Progressing   Problem: Health Behavior/Discharge Planning: Goal: Ability to manage health-related needs will improve Outcome: Not Progressing   Problem: Clinical Measurements: Goal: Ability to maintain clinical measurements within normal limits will improve Outcome: Not Progressing Goal: Respiratory complications will improve Outcome: Not Progressing Goal: Cardiovascular complication will be avoided Outcome: Not Progressing   Problem: Activity: Goal: Risk for activity intolerance will decrease Outcome: Not Progressing   Problem: Elimination: Goal: Will not experience complications related to urinary retention Outcome: Not Progressing   Problem: Skin Integrity: Goal: Risk for impaired skin integrity will decrease Outcome: Not Progressing

## 2019-09-30 NOTE — Procedures (Addendum)
Patient Name: Felicia [name]  MRN: [id number]  Epilepsy Attending: Lora Havens  Referring Physician/Provider: Dr Donnetta Simpers Duration: 09/30/2019 0029 to 10/01/2019 0030  Patient history: 48yo F s/o cardiac arrest. EEG to evaluate for seizure  Level of alertness:  comatose  AEDs during EEG study: propofol  Technical aspects: This EEG study was done with scalp electrodes positioned according to the 10-20 International system of electrode placement. Electrical activity was acquired at a sampling rate of _0  and reviewed with a high frequency filter of _1  and a low frequency filter of _2 . EEG data were recorded continuously and digitally stored.   Description: EEG initlally showed bursts suppression with 2-3 seconds of suppression alternating with 1 second of 3-_3  theta-delta slowing which at times appeared sharply contoured. Gradually, eeg showed generalized background suppression.  After noon on 09/30/2019 EEG again started showing generalized periodic epileptiform discharges at 0.5 Hz.  Event button was pressed at 1440 on 09/30/1019.  Patient was noted to have bilateral sudden eye opening.  Concomitant EEG showed generalized polyspikes consistent with myoclonic seizure.  Once propofol was restarted, EEG again showed generalized background suppression.  ABNORMALITY - Myoclonic seizure, generalized -Burst suppression, generalized - Background suppression, generalized  IMPRESSION: This study showed evidence of myoclonic seizures when propofol was stopped.  Was propofol was restarted, EEG was suggestive of profound diffuse encephalopathy, nonspecific etiology but likely related to sedation, anoxic/hypoxic brain injury.   [name]

## 2019-09-30 NOTE — Progress Notes (Addendum)
[place] Progress Note Patient Name: Felicia [name] DOB: [birth year] MRN: [id number]   Date of Service  09/30/2019  HPI/Events of Note  Notified of right groin bleeding, around the femoral art line insertion site. I am told this issue happened during the day as well and RN believes it is right from the insertion site that may be a little loose, and is requesting ground team evaluate for possible suture. Seen on camera and ooze noted.   eICU Interventions  Notified ground team to evaluate this.      Intervention Category Major Interventions: Hemorrhage - evaluation and management  Margaretmary Lombard 09/30/2019, 11:16 PM   1:55 am Ground team had placed a stitch around the art line which helped initially RN now notes dressing once again saturated with blood Is due to have 2 am PTT done We will check the AM lab now - CBC, BMP, mag, as well as a PT INR (noted INR yesterday was 3.1 and do not see a repeat) Hold the heparin infusion for now, it is infusing at 800 units/hour  Asked RN to let us know when labs result   4.30 am Labs resulted H/H and platelets are ok INR is 2.0 Continue to hold heparin  Repeat CBC at 8 am Defer to day team to restart heparin based on how she does in next few hours - needs to have INR/PTT checked before doing this   6.15 am RN noted tube feeds from patient;s nose Feeds are on hold Will get CXR and KUB

## 2019-09-30 NOTE — Progress Notes (Signed)
Pharmacy Antibiotic Note  Felicia [name] is a 49 y.o. female with respiratory distress and was transferred now on mechanical ventilation. Pharmacy consulted to    start vancomycin and zosyn for suspected bacteremia -WBC= 10.2, afebrile, SCr= 0.9 >   Plan: Hold Vancomycin for tonight, check random level in the AM. Change Zosyn to cefepime 2g IV q 12 hrs Will watch renal function carefully.   Height: _0  (162.6 cm) Weight: 82.1 kg (181 lb) IBW/kg (Calculated) : 54.7  Temp (24hrs), Avg:98.1 F (36.7 C), Min:98.1 F (36.7 C), Max:98.1 F (36.7 C)  Recent Labs  Lab 09/25/19 2320 09/25/19 2320 09/28/19 1102 09/29/19 1430 09/29/19 1840 09/29/19 2230 09/29/19 2301 09/30/19 0500 09/30/19 0753  WBC 11.7*  --   --  9.6  --   --   --  10.2  --   CREATININE 0.80   < > 0.93 1.21*  --   --  1.30* 1.82* 2.00*  LATICACIDVEN  --   --   --   --  7.5* 7.1*  --   --   --    < > = values in this interval not displayed.    Estimated Creatinine Clearance: 35.3 mL/min (A) (by C-G formula based on SCr of 2 mg/dL (H)).    No Active Allergies  Antimicrobials this admission: 7/28 vanc>>7/29 7/28 cefepime>>7/29 7/29 doxy x1, 8/3>>8/7 8/22 vanc>>  8/22 zosyn>> 8/23 8/23 cefepime >   Dose adjustments this admission:  7/28 blood x2: neg 7/28 urine: abnormal poor collection  Microbiology results:   Thank you for allowing pharmacy to be a part of this patient's care.  [place] Crane, Roylene Reason, BCCP Clinical Pharmacist  09/30/2019 3:42 PM   [facility] pharmacy phone numbers are listed on [email or web address]

## 2019-09-30 NOTE — Progress Notes (Signed)
Chaplain engaged in initial visit with Felicia [name] and her daughter.  Chaplain offered support to Felicia [name]'s daughter.  She hared that her mom has six sisters and that her nana will also be coming to visit.  They have worked out a schedule for someone to be with her everyday.  Daughter shared with chaplain that her mom has been in the hospital for three weeks and that she has been on the ICU for about two days.  She also shared that because of her lung disease her mom was sedated and put on a ventilator.  Chaplain worked to get to know daughter and Felicia [name].  Chaplain will continue to follow-up.

## 2019-10-01 ENCOUNTER — Telehealth: Payer: Self-pay | Admitting: Pulmonary Disease

## 2019-10-01 ENCOUNTER — Inpatient Hospital Stay (HOSPITAL_COMMUNITY): Payer: 59

## 2019-10-01 DIAGNOSIS — N179 Acute kidney failure, unspecified: Secondary | ICD-10-CM

## 2019-10-01 DIAGNOSIS — G9340 Encephalopathy, unspecified: Secondary | ICD-10-CM

## 2019-10-01 DIAGNOSIS — J9622 Acute and chronic respiratory failure with hypercapnia: Secondary | ICD-10-CM

## 2019-10-01 DIAGNOSIS — I2609 Other pulmonary embolism with acute cor pulmonale: Secondary | ICD-10-CM

## 2019-10-01 LAB — GLUCOSE, CAPILLARY
Glucose-Capillary: 203 mg/dL — ABNORMAL HIGH (ref 70–99)
Glucose-Capillary: 212 mg/dL — ABNORMAL HIGH (ref 70–99)
Glucose-Capillary: 231 mg/dL — ABNORMAL HIGH (ref 70–99)
Glucose-Capillary: 234 mg/dL — ABNORMAL HIGH (ref 70–99)
Glucose-Capillary: 240 mg/dL — ABNORMAL HIGH (ref 70–99)
Glucose-Capillary: 201 mg/dL — ABNORMAL HIGH (ref 70–99)
Glucose-Capillary: 153 mg/dL — ABNORMAL HIGH (ref 70–99)
Glucose-Capillary: 117 mg/dL — ABNORMAL HIGH (ref 70–99)
Glucose-Capillary: 163 mg/dL — ABNORMAL HIGH (ref 70–99)
Glucose-Capillary: 131 mg/dL — ABNORMAL HIGH (ref 70–99)
Glucose-Capillary: 103 mg/dL — ABNORMAL HIGH (ref 70–99)
Glucose-Capillary: 109 mg/dL — ABNORMAL HIGH (ref 70–99)
Glucose-Capillary: 108 mg/dL — ABNORMAL HIGH (ref 70–99)
Glucose-Capillary: 121 mg/dL — ABNORMAL HIGH (ref 70–99)
Glucose-Capillary: 111 mg/dL — ABNORMAL HIGH (ref 70–99)
Glucose-Capillary: 140 mg/dL — ABNORMAL HIGH (ref 70–99)

## 2019-10-01 LAB — POCT I-STAT 7, (LYTES, BLD GAS, ICA,H+H)
Acid-base deficit: 10 mmol/L — ABNORMAL HIGH (ref 0.0–2.0)
Bicarbonate: 19.1 mmol/L — ABNORMAL LOW (ref 20.0–28.0)
Calcium, Ion: 1.01 mmol/L — ABNORMAL LOW (ref 1.15–1.40)
HCT: 32 % — ABNORMAL LOW (ref 36.0–46.0)
Hemoglobin: 10.9 g/dL — ABNORMAL LOW (ref 12.0–15.0)
O2 Saturation: 98 %
Potassium: 4.6 mmol/L (ref 3.5–5.1)
Sodium: 132 mmol/L — ABNORMAL LOW (ref 135–145)
TCO2: 21 mmol/L — ABNORMAL LOW (ref 22–32)
pCO2 arterial: 55.7 mmHg — ABNORMAL HIGH (ref 32.0–48.0)
pH, Arterial: 7.143 — CL (ref 7.350–7.450)
pO2, Arterial: 131 mmHg — ABNORMAL HIGH (ref 83.0–108.0)

## 2019-10-01 LAB — CBC
HCT: 35.4 % — ABNORMAL LOW (ref 36.0–46.0)
HCT: 34.9 % — ABNORMAL LOW (ref 36.0–46.0)
Hemoglobin: 9.7 g/dL — ABNORMAL LOW (ref 12.0–15.0)
Hemoglobin: 9.8 g/dL — ABNORMAL LOW (ref 12.0–15.0)
MCH: 24 pg — ABNORMAL LOW (ref 26.0–34.0)
MCH: 25.1 pg — ABNORMAL LOW (ref 26.0–34.0)
MCHC: 27.4 g/dL — ABNORMAL LOW (ref 30.0–36.0)
MCHC: 28.1 g/dL — ABNORMAL LOW (ref 30.0–36.0)
MCV: 87.6 fL (ref 80.0–100.0)
MCV: 89.5 fL (ref 80.0–100.0)
Platelets: 230 10*3/uL (ref 150–400)
Platelets: 245 10*3/uL (ref 150–400)
RBC: 4.04 MIL/uL (ref 3.87–5.11)
RBC: 3.9 MIL/uL (ref 3.87–5.11)
RDW: 19.5 % — ABNORMAL HIGH (ref 11.5–15.5)
RDW: 19.7 % — ABNORMAL HIGH (ref 11.5–15.5)
WBC: 11.8 10*3/uL — ABNORMAL HIGH (ref 4.0–10.5)
WBC: 12.6 10*3/uL — ABNORMAL HIGH (ref 4.0–10.5)
nRBC: 4 % — ABNORMAL HIGH (ref 0.0–0.2)
nRBC: 3 % — ABNORMAL HIGH (ref 0.0–0.2)

## 2019-10-01 LAB — BASIC METABOLIC PANEL
Anion gap: 20 — ABNORMAL HIGH (ref 5–15)
BUN: 41 mg/dL — ABNORMAL HIGH (ref 6–20)
CO2: 19 mmol/L — ABNORMAL LOW (ref 22–32)
Calcium: 7.9 mg/dL — ABNORMAL LOW (ref 8.9–10.3)
Chloride: 96 mmol/L — ABNORMAL LOW (ref 98–111)
Creatinine, Ser: 2.73 mg/dL — ABNORMAL HIGH (ref 0.44–1.00)
GFR calc Af Amer: 23 mL/min — ABNORMAL LOW (ref >60)
GFR calc non Af Amer: 20 mL/min — ABNORMAL LOW (ref >60)
Glucose, Bld: 224 mg/dL — ABNORMAL HIGH (ref 70–99)
Potassium: 4.8 mmol/L (ref 3.5–5.1)
Sodium: 135 mmol/L (ref 135–145)

## 2019-10-01 LAB — CALCIUM, IONIZED: Calcium, Ionized, Serum: 3.9 mg/dL — ABNORMAL LOW (ref 4.5–5.6)

## 2019-10-01 LAB — HEPARIN LEVEL (UNFRACTIONATED): Heparin Unfractionated: 1.58 IU/mL — ABNORMAL HIGH (ref 0.30–0.70)

## 2019-10-01 LAB — TRIGLYCERIDES: Triglycerides: 121 mg/dL (ref <150)

## 2019-10-01 LAB — APTT
aPTT: 96 seconds — ABNORMAL HIGH (ref 24–36)
aPTT: 41 seconds — ABNORMAL HIGH (ref 24–36)

## 2019-10-01 LAB — PROTIME-INR
INR: 2 — ABNORMAL HIGH (ref 0.8–1.2)
Prothrombin Time: 22.3 seconds — ABNORMAL HIGH (ref 11.4–15.2)

## 2019-10-01 LAB — PATHOLOGIST SMEAR REVIEW

## 2019-10-01 LAB — MAGNESIUM: Magnesium: 1.7 mg/dL (ref 1.7–2.4)

## 2019-10-01 MED ORDER — FENTANYL CITRATE (PF) 100 MCG/2ML IJ SOLN
50.0000 ug | INTRAMUSCULAR | Status: DC | PRN
  Administered 2019-10-02 (×2): 100 ug via INTRAVENOUS
  Filled 2019-10-01 (×2): qty 2

## 2019-10-01 MED ORDER — DOCUSATE SODIUM 50 MG/5ML PO LIQD: 100.0000 mg | Freq: Two times a day (BID) | ORAL | Status: DC

## 2019-10-01 MED ORDER — SODIUM CHLORIDE 0.9 % IV SOLN
2.0000 g | INTRAVENOUS | Status: DC
  Filled 2019-10-01: qty 2

## 2019-10-01 MED ORDER — SODIUM BICARBONATE 4.2 % IV SOLN: 100.0000 meq | Freq: Once | INTRAVENOUS | Status: DC

## 2019-10-01 MED ORDER — SODIUM BICARBONATE-DEXTROSE 150-5 MEQ/L-% IV SOLN
150.0000 meq | INTRAVENOUS | Status: DC
  Filled 2019-10-01: qty 1000

## 2019-10-01 MED ORDER — FENTANYL CITRATE (PF) 100 MCG/2ML IJ SOLN
50.0000 ug | INTRAMUSCULAR | Status: DC | PRN
  Administered 2019-10-01: 50 ug via INTRAVENOUS
  Filled 2019-10-01: qty 2

## 2019-10-01 MED ORDER — SODIUM BICARBONATE 8.4 % IV SOLN: 100.0000 meq | Freq: Once | INTRAVENOUS | Status: DC

## 2019-10-01 MED ORDER — SODIUM BICARBONATE 8.4 % IV SOLN
INTRAVENOUS | Status: DC
  Filled 2019-10-01 (×2): qty 150

## 2019-10-01 NOTE — Progress Notes (Signed)
Subjective: Intubated and sedated on propofol at a rate of 25.   Objective: Current vital signs: BP 100/65   Pulse 84   Temp 98.6 F (37 C) (Oral)   Resp 14   Ht _0  (1.626 m)   Wt 85.2 kg   SpO2 100%   BMI 32.24 kg/m  Vital signs in last 24 hours: Temp:  [98.6 F (37 C)-99.2 F (37.3 C)] 98.6 F (37 C) (08/24 0453) Pulse Rate:  [79-97] 84 (08/24 0900) Resp:  [14-38] 14 (08/24 0900) BP: (89-104)/(41-68) 100/65 (08/24 0900) SpO2:  [100 %] 100 % (08/24 0900) FiO2 (%):  [90 %-100 %] 90 % (08/24 0804) Weight:  [85.2 kg] 85.2 kg (08/24 0500)  Intake/Output from previous day: 08/23 0701 - 08/24 0700 In: 3356.3 [I.V.:2015.3; NG/GT:631; IV Piggyback:710] Out: 75 [Urine:75] Intake/Output this shift: Total I/O In: 180.9 [I.V.:180.9] Out: -  Nutritional status:  Diet Order            Diet NPO time specified  Diet effective now                 HEENT: [place]/AT. Proptosis noted.  Ext: Warm and well perfused  Ment:Intubated and sedated with propofol at a rate of 25. No response to voice or loud clap, no response to sternal rub or arm pinch. No spontaneous or reflexive movements noted. Brainstem reflexes:  Pupils: Round, 7 mm bilaterally, constricting sluggishly to 4 mm with penlight.  Occulocephalic relfex: Absent Does not blink to eyelid stimulation No grimace to pain Motor/Sensory: No response to noxious stimuli. Flaccid tone in all extremities. Reflexes: 0 in all extremities.  Lab Results: Results for orders placed or performed during the hospital encounter of 08/13/2019 (from the past 48 hour(s))  Glucose, capillary     Status: Abnormal   Collection Time: 09/29/19  9:37 AM  Result Value Ref Range   Glucose-Capillary 50 (L) 70 - 99 mg/dL    Comment: Glucose reference range applies only to samples taken after fasting for at least 8 hours.  Glucose, capillary     Status: None   Collection Time: 09/29/19 10:57 AM  Result Value Ref Range   Glucose-Capillary 88 70  - 99 mg/dL    Comment: Glucose reference range applies only to samples taken after fasting for at least 8 hours.  I-STAT 7, (LYTES, BLD GAS, ICA, H+H)     Status: Abnormal   Collection Time: 09/29/19  1:15 PM  Result Value Ref Range   pH, Arterial 7.157 (LL) 7.35 - 7.45   pCO2 arterial 62.2 (H) 32 - 48 mmHg   pO2, Arterial 97 83 - 108 mmHg   Bicarbonate 22.0 20.0 - 28.0 mmol/L   TCO2 24 22 - 32 mmol/L   O2 Saturation 95.0 %   Acid-base deficit 7.0 (H) 0.0 - 2.0 mmol/L   Sodium 139 135 - 145 mmol/L   Potassium 3.5 3.5 - 5.1 mmol/L   Calcium, Ion 1.08 (L) 1.15 - 1.40 mmol/L   HCT 35.0 (L) 36 - 46 %   Hemoglobin 11.9 (L) 12.0 - 15.0 g/dL   Patient temperature 98.6 F    Collection site Magazine features editor by [employer]    Sample type ARTERIAL   Urinalysis, Routine w reflex microscopic Urine, Catheterized     Status: Abnormal   Collection Time: 09/29/19  1:37 PM  Result Value Ref Range   Color, Urine AMBER (A) YELLOW    Comment: BIOCHEMICALS MAY BE AFFECTED BY COLOR  APPearance CLOUDY (A) CLEAR   Specific Gravity, Urine 1.025 1.005 - 1.030   pH 5.0 5.0 - 8.0   Glucose, UA NEGATIVE NEGATIVE mg/dL   Hgb urine dipstick MODERATE (A) NEGATIVE   Bilirubin Urine NEGATIVE NEGATIVE   Ketones, ur NEGATIVE NEGATIVE mg/dL   Protein, ur 100 (A) NEGATIVE mg/dL   Nitrite NEGATIVE NEGATIVE   Leukocytes,Ua LARGE (A) NEGATIVE   RBC / HPF 21-50 0 - 5 RBC/hpf   WBC, UA >50 (H) 0 - 5 WBC/hpf   Bacteria, UA NONE SEEN NONE SEEN   Squamous Epithelial / LPF 6-10 0 - 5   WBC Clumps PRESENT    Mucus PRESENT    Budding Yeast PRESENT     Comment: Performed at [facility], [place] [address], [place], [place] [address]  CBC     Status: Abnormal   Collection Time: 09/29/19  2:30 PM  Result Value Ref Range   WBC 9.6 4.0 - 10.5 K/uL   RBC 4.57 3.87 - 5.11 MIL/uL   Hemoglobin 11.4 (L) 12.0 - 15.0 g/dL   HCT 40.8 36 - 46 %   MCV 89.3 80.0 - 100.0 fL   MCH 24.9 (L) 26.0 - 34.0 pg   MCHC 27.9 (L) 30.0 - 36.0  g/dL   RDW 20.1 (H) 11.5 - 15.5 %   Platelets 238 150 - 400 K/uL   nRBC 1.5 (H) 0.0 - 0.2 %    Comment: Performed at [facility] [address] [place], [place] [address]  Basic metabolic panel     Status: Abnormal   Collection Time: 09/29/19  2:30 PM  Result Value Ref Range   Sodium 145 135 - 145 mmol/L   Potassium 4.0 3.5 - 5.1 mmol/L   Chloride 101 98 - 111 mmol/L   CO2 22 22 - 32 mmol/L   Glucose, Bld 224 (H) 70 - 99 mg/dL    Comment: Glucose reference range applies only to samples taken after fasting for at least 8 hours.   BUN 17 6 - 20 mg/dL   Creatinine, Ser 1.21 (H) 0.44 - 1.00 mg/dL   Calcium 8.1 (L) 8.9 - 10.3 mg/dL   GFR calc non Af Amer 52 (L) >60 mL/min   GFR calc Af Amer >60 >60 mL/min   Anion gap 22 (H) 5 - 15    Comment: Performed at [facility] [address], [place], [place] [address]  Magnesium     Status: None   Collection Time: 09/29/19  2:30 PM  Result Value Ref Range   Magnesium 2.1 1.7 - 2.4 mg/dL    Comment: Performed at [facility], [place] [address], [place], [place] [address]  Calcium, ionized     Status: Abnormal   Collection Time: 09/29/19  2:30 PM  Result Value Ref Range   Calcium, Ionized, Serum 3.9 (L) 4.5 - 5.6 mg/dL    Comment: (NOTE) Performed At: [facility] [place], [place] [id number] Rush Farmer MD LK:[id number]   Troponin I (High Sensitivity)     Status: Abnormal   Collection Time: 09/29/19  2:30 PM  Result Value Ref Range   Troponin I (High Sensitivity) 198 (HH) <18 ng/L    Comment: CRITICAL RESULT CALLED TO, READ BACK BY AND VERIFIED WITH: RN E MESSICK AT 1528 09/29/19 BY L BENFIELD (NOTE) Elevated high sensitivity troponin I (hsTnI) values and significant  changes across serial measurements may suggest ACS but many other  chronic  and acute conditions are known to elevate hsTnI results.  Refer to the Links section for chest pain algorithms and additional  guidance. Performed at  [facility], [place] [address] [place], [place] [address]   Glucose, capillary     Status: Abnormal   Collection Time: 09/29/19  3:07 PM  Result Value Ref Range   Glucose-Capillary 208 (H) 70 - 99 mg/dL    Comment: Glucose reference range applies only to samples taken after fasting for at least 8 hours.  I-STAT 7, (LYTES, BLD GAS, ICA, H+H)     Status: Abnormal   Collection Time: 09/29/19  4:04 PM  Result Value Ref Range   pH, Arterial 7.233 (L) 7.35 - 7.45   pCO2 arterial 53.5 (H) 32 - 48 mmHg   pO2, Arterial 269 (H) 83 - 108 mmHg   Bicarbonate 22.6 20.0 - 28.0 mmol/L   TCO2 24 22 - 32 mmol/L   O2 Saturation 100.0 %   Acid-base deficit 5.0 (H) 0.0 - 2.0 mmol/L   Sodium 138 135 - 145 mmol/L   Potassium 3.6 3.5 - 5.1 mmol/L   Calcium, Ion 0.99 (L) 1.15 - 1.40 mmol/L   HCT 36.0 36 - 46 %   Hemoglobin 12.2 12.0 - 15.0 g/dL   Patient temperature 98.6 F    Collection site Magazine features editor by Operator    Sample type ARTERIAL   Troponin I (High Sensitivity)     Status: Abnormal   Collection Time: 09/29/19  6:39 PM  Result Value Ref Range   Troponin I (High Sensitivity) 373 (HH) <18 ng/L    Comment: CRITICAL VALUE NOTED.  VALUE IS CONSISTENT WITH PREVIOUSLY REPORTED AND CALLED VALUE. (NOTE) Elevated high sensitivity troponin I (hsTnI) values and significant  changes across serial measurements may suggest ACS but many other  chronic and acute conditions are known to elevate hsTnI results.  Refer to the Links section for chest pain algorithms and additional  guidance. Performed at [facility], [place] [address], [place], [place] [address]   Lactic acid, plasma     Status: Abnormal   Collection Time: 09/29/19  6:40 PM  Result Value Ref Range   Lactic Acid, Venous 7.5 (HH) 0.5 - 1.9 mmol/L    Comment: CRITICAL VALUE NOTED.  VALUE IS CONSISTENT WITH PREVIOUSLY REPORTED AND CALLED VALUE. Performed at [facility], [place] [address], [place], [place] [address]   Glucose,  capillary     Status: Abnormal   Collection Time: 09/29/19  7:46 PM  Result Value Ref Range   Glucose-Capillary 290 (H) 70 - 99 mg/dL    Comment: Glucose reference range applies only to samples taken after fasting for at least 8 hours.  Lactic acid, plasma     Status: Abnormal   Collection Time: 09/29/19 10:30 PM  Result Value Ref Range   Lactic Acid, Venous 7.1 (HH) 0.5 - 1.9 mmol/L    Comment: CRITICAL RESULT CALLED TO, READ BACK BY AND VERIFIED WITH: RN M HAGANS _0  09/29/19 BY S GEZAHEGN Performed at [facility], [place] [address], [place], [place] [address]   I-STAT 7, (LYTES, BLD GAS, ICA, H+H)     Status: Abnormal   Collection Time: 09/29/19 10:56 PM  Result Value Ref Range   pH, Arterial 7.308 (L) 7.35 - 7.45   pCO2 arterial 48.2 (H) 32 - 48 mmHg   pO2, Arterial 195 (H) 83 - 108 mmHg   Bicarbonate 24.1 20.0 -  28.0 mmol/L   TCO2 26 22 - 32 mmol/L   O2 Saturation 100.0 %   Acid-base deficit 2.0 0.0 - 2.0 mmol/L   Sodium 138 135 - 145 mmol/L   Potassium 3.8 3.5 - 5.1 mmol/L   Calcium, Ion 1.06 (L) 1.15 - 1.40 mmol/L   HCT 37.0 36 - 46 %   Hemoglobin 12.6 12.0 - 15.0 g/dL   Patient temperature 98.9 F    Sample type ARTERIAL   I-STAT, chem 8     Status: Abnormal   Collection Time: 09/29/19 11:01 PM  Result Value Ref Range   Sodium 140 135 - 145 mmol/L   Potassium 3.8 3.5 - 5.1 mmol/L   Chloride 99 98 - 111 mmol/L   BUN 25 (H) 6 - 20 mg/dL   Creatinine, Ser 1.30 (H) 0.44 - 1.00 mg/dL   Glucose, Bld 328 (H) 70 - 99 mg/dL    Comment: Glucose reference range applies only to samples taken after fasting for at least 8 hours.   Calcium, Ion 1.09 (L) 1.15 - 1.40 mmol/L   TCO2 22 22 - 32 mmol/L   Hemoglobin 13.3 12.0 - 15.0 g/dL   HCT 39.0 36 - 46 %  CBC     Status: Abnormal   Collection Time: 09/30/19  5:00 AM  Result Value Ref Range   WBC 10.2 4.0 - 10.5 K/uL   RBC 4.14 3.87 - 5.11 MIL/uL   Hemoglobin 10.1 (L) 12.0 - 15.0 g/dL   HCT 35.8 (L) 36 - 46 %   MCV 86.5  80.0 - 100.0 fL   MCH 24.4 (L) 26.0 - 34.0 pg   MCHC 28.2 (L) 30.0 - 36.0 g/dL   RDW 19.7 (H) 11.5 - 15.5 %   Platelets 274 150 - 400 K/uL   nRBC 1.5 (H) 0.0 - 0.2 %    Comment: Performed at [facility], [place] [address], [place], [place] [address]  Heparin level (unfractionated)     Status: Abnormal   Collection Time: 09/30/19  5:00 AM  Result Value Ref Range   Heparin Unfractionated >2.20 (H) 0.30 - 0.70 IU/mL    Comment: RESULTS CONFIRMED BY MANUAL DILUTION (NOTE) If heparin results are below expected values, and patient dosage has  been confirmed, suggest follow up testing of antithrombin III levels. Performed at [facility], [place] [address] [place], [place] [address]   APTT     Status: Abnormal   Collection Time: 09/30/19  5:00 AM  Result Value Ref Range   aPTT >200 (HH) 24 - 36 seconds    Comment: REPEATED TO VERIFY IF BASELINE aPTT IS ELEVATED, SUGGEST PATIENT RISK ASSESSMENT BE USED TO DETERMINE APPROPRIATE ANTICOAGULANT THERAPY. CRITICAL RESULT CALLED TO, READ BACK BY AND VERIFIED WITH: Ellin Mayhew, RN [phone] 09/30/2019 BY MACEDA,J. Performed at [facility], [place] [address], [place], [place] [address]   Magnesium     Status: None   Collection Time: 09/30/19  5:00 AM  Result Value Ref Range   Magnesium 1.8 1.7 - 2.4 mg/dL    Comment: Performed at [facility] [address], [place],  [address]  Basic metabolic panel     Status: Abnormal   Collection Time: 09/30/19  5:00 AM  Result Value Ref Range   Sodium 136 135 - 145 mmol/L   Potassium 4.9 3.5 - 5.1 mmol/L   Chloride 98 98 - 111 mmol/L   CO2 20 (L) 22 - 32 mmol/L  Glucose, Bld 509 (HH) 70 - 99 mg/dL    Comment: Glucose reference range applies only to samples taken after fasting for at least 8 hours. CRITICAL RESULT CALLED TO, READ BACK BY AND VERIFIED WITH: RN D FLOYD _0  09/30/19 BY S GEZAHEGN    BUN 30 (H) 6 - 20 mg/dL   Creatinine, Ser 1.82 (H) 0.44 - 1.00 mg/dL   Calcium  8.0 (L) 8.9 - 10.3 mg/dL   GFR calc non Af Amer 32 (L) >60 mL/min   GFR calc Af Amer 37 (L) >60 mL/min   Anion gap 18 (H) 5 - 15    Comment: Performed at [facility] [address] [place], [place] [address]  Triglycerides     Status: None   Collection Time: 09/30/19  5:00 AM  Result Value Ref Range   Triglycerides 85 <150 mg/dL    Comment: Performed at [facility] [address] [place], [place] [address]  Glucose, capillary     Status: Abnormal   Collection Time: 09/30/19  5:38 AM  Result Value Ref Range   Glucose-Capillary 390 (H) 70 - 99 mg/dL    Comment: Glucose reference range applies only to samples taken after fasting for at least 8 hours.  Glucose, capillary     Status: Abnormal   Collection Time: 09/30/19  7:21 AM  Result Value Ref Range   Glucose-Capillary 446 (H) 70 - 99 mg/dL    Comment: Glucose reference range applies only to samples taken after fasting for at least 8 hours.  Basic metabolic panel     Status: Abnormal   Collection Time: 09/30/19  7:53 AM  Result Value Ref Range   Sodium 137 135 - 145 mmol/L   Potassium 4.3 3.5 - 5.1 mmol/L   Chloride 98 98 - 111 mmol/L   CO2 20 (L) 22 - 32 mmol/L   Glucose, Bld 418 (H) 70 - 99 mg/dL    Comment: Glucose reference range applies only to samples taken after fasting for at least 8 hours.   BUN 30 (H) 6 - 20 mg/dL   Creatinine, Ser 2.00 (H) 0.44 - 1.00 mg/dL   Calcium 8.0 (L) 8.9 - 10.3 mg/dL   GFR calc non Af Amer 29 (L) >60 mL/min   GFR calc Af Amer 33 (L) >60 mL/min   Anion gap 19 (H) 5 - 15    Comment: Performed at [facility] [address] [place], [place] [address]  Ammonia     Status: None   Collection Time: 09/30/19  7:53 AM  Result Value Ref Range   Ammonia 33 9 - 35 umol/L    Comment: Performed at [facility], [place] [address], [place], [place] [address]  Protime-INR     Status: Abnormal   Collection Time: 09/30/19  7:58 AM  Result Value Ref Range   Prothrombin Time 31.1  (H) 11.4 - 15.2 seconds   INR 3.1 (H) 0.8 - 1.2    Comment: (NOTE) INR goal varies based on device and disease states. Performed at [facility], [place] [address] [place], [place] [address]   Glucose, capillary     Status: Abnormal   Collection Time: 09/30/19  8:04 AM  Result Value Ref Range   Glucose-Capillary 375 (H) 70 - 99 mg/dL    Comment: Glucose reference range applies only to samples taken after fasting for at least 8 hours.  APTT     Status: Abnormal  Collection Time: 09/30/19  8:34 AM  Result Value Ref Range   aPTT >200 (HH) 24 - 36 seconds    Comment:        IF BASELINE aPTT IS ELEVATED, SUGGEST PATIENT RISK ASSESSMENT BE USED TO DETERMINE APPROPRIATE ANTICOAGULANT THERAPY. CRITICAL RESULT CALLED TO, READ BACK BY AND VERIFIED WITH: COLLRAN,E RN @ 4540 09/30/19 LEONARD,A REPEATED TO VERIFY Performed at [facility], [place] [address] [place], [place] [address] CORRECTED ON 08/23 AT 1531: PREVIOUSLY REPORTED AS >200        IF BASELINE aPTT IS ELEVATED, SUGGEST PATIENT RISK ASSESSMENT BE USED TO DETERMINE APPROPRIATE ANTICOAGULANT THERAPY. CRITICAL RESULT CALLED TO, READ BACK BY AND VERIFIED WITH: COLLRAN,E RN @  1027 09/30/19 LEONARD,A   Heparin level (unfractionated)     Status: Abnormal   Collection Time: 09/30/19  8:34 AM  Result Value Ref Range   Heparin Unfractionated >2.20 (H) 0.30 - 0.70 IU/mL    Comment: RESULTS CONFIRMED BY MANUAL DILUTION (NOTE) If heparin results are below expected values, and patient dosage has  been confirmed, suggest follow up testing of antithrombin III levels. Performed at [facility], [place] [address], [place], [place] [address]   Glucose, capillary     Status: Abnormal   Collection Time: 09/30/19 11:37 AM  Result Value Ref Range   Glucose-Capillary 161 (H) 70 - 99 mg/dL    Comment: Glucose reference range applies only to samples taken after fasting for at least 8 hours.  Glucose, capillary     Status: Abnormal    Collection Time: 09/30/19 12:28 PM  Result Value Ref Range   Glucose-Capillary 117 (H) 70 - 99 mg/dL    Comment: Glucose reference range applies only to samples taken after fasting for at least 8 hours.  APTT     Status: Abnormal   Collection Time: 09/30/19  2:11 PM  Result Value Ref Range   aPTT 38 (H) 24 - 36 seconds    Comment:        IF BASELINE aPTT IS ELEVATED, SUGGEST PATIENT RISK ASSESSMENT BE USED TO DETERMINE APPROPRIATE ANTICOAGULANT THERAPY. Performed at [facility], [place] [address], [place], [place] [address]   Glucose, capillary     Status: Abnormal   Collection Time: 09/30/19  3:34 PM  Result Value Ref Range   Glucose-Capillary 143 (H) 70 - 99 mg/dL    Comment: Glucose reference range applies only to samples taken after fasting for at least 8 hours.  Glucose, capillary     Status: Abnormal   Collection Time: 09/30/19  4:48 PM  Result Value Ref Range   Glucose-Capillary 166 (H) 70 - 99 mg/dL    Comment: Glucose reference range applies only to samples taken after fasting for at least 8 hours.  Glucose, capillary     Status: Abnormal   Collection Time: 09/30/19  5:31 PM  Result Value Ref Range   Glucose-Capillary 168 (H) 70 - 99 mg/dL    Comment: Glucose reference range applies only to samples taken after fasting for at least 8 hours.  Glucose, capillary     Status: Abnormal   Collection Time: 09/30/19  6:33 PM  Result Value Ref Range   Glucose-Capillary 156 (H) 70 - 99 mg/dL    Comment: Glucose reference range applies only to samples taken after fasting for at least 8 hours.  Glucose, capillary     Status: Abnormal   Collection Time: 09/30/19  7:37 PM  Result Value Ref Range  Glucose-Capillary 153 (H) 70 - 99 mg/dL    Comment: Glucose reference range applies only to samples taken after fasting for at least 8 hours.  Glucose, capillary     Status: Abnormal   Collection Time: 09/30/19  9:36 PM  Result Value Ref Range   Glucose-Capillary 135 (H) 70 - 99  mg/dL    Comment: Glucose reference range applies only to samples taken after fasting for at least 8 hours.  Glucose, capillary     Status: Abnormal   Collection Time: 09/30/19 11:58 PM  Result Value Ref Range   Glucose-Capillary 149 (H) 70 - 99 mg/dL    Comment: Glucose reference range applies only to samples taken after fasting for at least 8 hours.  Glucose, capillary     Status: Abnormal   Collection Time: 10/01/19  1:10 AM  Result Value Ref Range   Glucose-Capillary 203 (H) 70 - 99 mg/dL    Comment: Glucose reference range applies only to samples taken after fasting for at least 8 hours.  APTT     Status: Abnormal   Collection Time: 10/01/19  2:12 AM  Result Value Ref Range   aPTT 96 (H) 24 - 36 seconds    Comment:        IF BASELINE aPTT IS ELEVATED, SUGGEST PATIENT RISK ASSESSMENT BE USED TO DETERMINE APPROPRIATE ANTICOAGULANT THERAPY. Performed at [facility], [place] [address], [place], [place] [address]   CBC     Status: Abnormal   Collection Time: 10/01/19  2:12 AM  Result Value Ref Range   WBC 11.8 (H) 4.0 - 10.5 K/uL    Comment: WHITE COUNT CONFIRMED ON SMEAR   RBC 4.04 3.87 - 5.11 MIL/uL   Hemoglobin 9.7 (L) 12.0 - 15.0 g/dL   HCT 35.4 (L) 36 - 46 %   MCV 87.6 80.0 - 100.0 fL   MCH 24.0 (L) 26.0 - 34.0 pg   MCHC 27.4 (L) 30.0 - 36.0 g/dL   RDW 19.5 (H) 11.5 - 15.5 %   Platelets 230 150 - 400 K/uL   nRBC 4.0 (H) 0.0 - 0.2 %    Comment: Performed at [facility], [place] [address], [place], [place] [address]  Heparin level (unfractionated)     Status: Abnormal   Collection Time: 10/01/19  2:12 AM  Result Value Ref Range   Heparin Unfractionated 1.58 (H) 0.30 - 0.70 IU/mL    Comment: RESULTS CONFIRMED BY MANUAL DILUTION (NOTE) If heparin results are below expected values, and patient dosage has  been confirmed, suggest follow up testing of antithrombin III levels. Performed at [facility], [place] [address], [place], [place] [address]   Magnesium      Status: None   Collection Time: 10/01/19  2:12 AM  Result Value Ref Range   Magnesium 1.7 1.7 - 2.4 mg/dL    Comment: Performed at [facility] [address], [place], [place] [address]  Basic metabolic panel     Status: Abnormal   Collection Time: 10/01/19  2:12 AM  Result Value Ref Range   Sodium 135 135 - 145 mmol/L   Potassium 4.8 3.5 - 5.1 mmol/L   Chloride 96 (L) 98 - 111 mmol/L   CO2 19 (L) 22 - 32 mmol/L   Glucose, Bld 224 (H) 70 - 99 mg/dL    Comment: Glucose reference range applies only to samples taken after fasting for at least 8 hours.   BUN 41 (H) 6 -  20 mg/dL   Creatinine, Ser 2.73 (H) 0.44 - 1.00 mg/dL   Calcium 7.9 (L) 8.9 - 10.3 mg/dL   GFR calc non Af Amer 20 (L) >60 mL/min   GFR calc Af Amer 23 (L) >60 mL/min   Anion gap 20 (H) 5 - 15    Comment: Performed at [facility] [address], [place], [place] [address]  Triglycerides     Status: None   Collection Time: 10/01/19  2:12 AM  Result Value Ref Range   Triglycerides 121 <150 mg/dL    Comment: Performed at [facility] [address] [place], [place] [address]  Protime-INR     Status: Abnormal   Collection Time: 10/01/19  2:12 AM  Result Value Ref Range   Prothrombin Time 22.3 (H) 11.4 - 15.2 seconds   INR 2.0 (H) 0.8 - 1.2    Comment: (NOTE) INR goal varies based on device and disease states. Performed at [facility], [place] [address] [place], [place] [address]   Glucose, capillary     Status: Abnormal   Collection Time: 10/01/19  2:23 AM  Result Value Ref Range   Glucose-Capillary 212 (H) 70 - 99 mg/dL    Comment: Glucose reference range applies only to samples taken after fasting for at least 8 hours.  Glucose, capillary     Status: Abnormal   Collection Time: 10/01/19  3:36 AM  Result Value Ref Range   Glucose-Capillary 240 (H) 70 - 99 mg/dL    Comment: Glucose reference range applies only to samples taken after fasting for at least 8 hours.  Glucose, capillary      Status: Abnormal   Collection Time: 10/01/19  3:53 AM  Result Value Ref Range   Glucose-Capillary 234 (H) 70 - 99 mg/dL    Comment: Glucose reference range applies only to samples taken after fasting for at least 8 hours.  Glucose, capillary     Status: Abnormal   Collection Time: 10/01/19  4:12 AM  Result Value Ref Range   Glucose-Capillary 231 (H) 70 - 99 mg/dL    Comment: Glucose reference range applies only to samples taken after fasting for at least 8 hours.  Glucose, capillary     Status: Abnormal   Collection Time: 10/01/19  4:51 AM  Result Value Ref Range   Glucose-Capillary 201 (H) 70 - 99 mg/dL    Comment: Glucose reference range applies only to samples taken after fasting for at least 8 hours.  Glucose, capillary     Status: Abnormal   Collection Time: 10/01/19  6:17 AM  Result Value Ref Range   Glucose-Capillary 153 (H) 70 - 99 mg/dL    Comment: Glucose reference range applies only to samples taken after fasting for at least 8 hours.  CBC     Status: Abnormal (Preliminary result)   Collection Time: 10/01/19  8:19 AM  Result Value Ref Range   WBC PENDING 4.0 - 10.5 K/uL   RBC 3.90 3.87 - 5.11 MIL/uL   Hemoglobin 9.8 (L) 12.0 - 15.0 g/dL   HCT 34.9 (L) 36 - 46 %   MCV 89.5 80.0 - 100.0 fL   MCH 25.1 (L) 26.0 - 34.0 pg   MCHC 28.1 (L) 30.0 - 36.0 g/dL   RDW 19.7 (H) 11.5 - 15.5 %   Platelets 245 150 - 400 K/uL    Comment: Performed at [facility] [address] [place], [place] [address]   nRBC  PENDING 0.0 - 0.2 %  APTT     Status: Abnormal   Collection Time: 10/01/19  8:19 AM  Result Value Ref Range   aPTT 41 (H) 24 - 36 seconds    Comment:        IF BASELINE aPTT IS ELEVATED, SUGGEST PATIENT RISK ASSESSMENT BE USED TO DETERMINE APPROPRIATE ANTICOAGULANT THERAPY. Performed at [facility], [place] [address], [place], [place] [address]     Recent Results (from the past 240 hour(s))  Culture, Urine     Status: Abnormal   Collection Time:  09/26/19 10:59 AM   Specimen: Urine, Clean Catch  Result Value Ref Range Status   Specimen Description URINE, CLEAN CATCH  Final   Special Requests   Final    NONE Performed at [facility], [address] [place], [place] [address]    Culture MULTIPLE SPECIES PRESENT, SUGGEST RECOLLECTION (A)  Final   Report Status 09/27/2019 FINAL  Final    Lipid Panel Recent Labs    10/01/19 0212  TRIG 121    Studies/Results: DG Chest 1 View  Result Date: 09/30/2019 CLINICAL DATA:  Intubation, ARDS EXAM: CHEST  1 VIEW COMPARISON:  09/29/2019 FINDINGS: No significant interval change in AP portable chest radiograph with diffuse bilateral interstitial airspace opacity, endotracheal intubation with tip projecting below the thoracic inlet, esophagogastric tube, and cardiomegaly. No new or focal airspace opacity. IMPRESSION: No significant interval change in AP portable chest radiograph with diffuse bilateral interstitial airspace opacity, endotracheal intubation with tip projecting below the thoracic inlet, esophagogastric tube, and cardiomegaly. Electronically Signed   By: Eddie Candle M.D.   On: 09/30/2019 07:35   DG Abd 1 View  Result Date: 10/01/2019 CLINICAL DATA:  Orogastric tube EXAM: ABDOMEN - 1 VIEW COMPARISON:  Twenty days ago FINDINGS: Nonobstructive bowel gas pattern. Femoral line on the right. Enteric tube with tip at the distal stomach level. No concerning mass effect or calcification. Known pulmonary disease. IMPRESSION: Orogastric tube tip overlaps the distal stomach. Electronically Signed   By: Monte Fantasia M.D.   On: 10/01/2019 07:00   DG Chest Port 1 View  Result Date: 10/01/2019 CLINICAL DATA:  Question aspiration.  Cardiac arrest. EXAM: PORTABLE CHEST 1 VIEW COMPARISON:  Yesterday FINDINGS: Extensive pulmonary opacity. Low lung volumes. There is a background of chronic lung disease with fibrosis and traction bronchiectasis by CT. Cardiomegaly. The endotracheal tube tip is  essentially at the clavicular heads. The enteric tube reaches the stomach. Negative for pneumothorax. IMPRESSION: 1. Acute on chronic pulmonary disease with mildly increased opacity from yesterday. 2. Stable hardware positioning. Electronically Signed   By: Monte Fantasia M.D.   On: 10/01/2019 06:59   DG CHEST PORT 1 VIEW  Result Date: 09/29/2019 CLINICAL DATA:  49 year old female status post intubation EXAM: PORTABLE CHEST 1 VIEW COMPARISON:  Prior chest x-ray 09/29/2019 at 8:14 a.m. FINDINGS: The patient has been intubated. The tip of the endotracheal tube is 5.2 cm above the carina. A gastric tube is present. The tip of the tube lies off the field of view, presumably within the stomach. An external defibrillator pad projects over the left chest. Cardiomegaly with pulmonary vascular congestion and diffuse bilateral interstitial prominence. Suspect layering left pleural effusion. No pneumothorax. Inspiratory volumes are low. IMPRESSION: 1. Interval intubation. The tip of the endotracheal tube is in good position 5.2 cm above the carina. 2. Nasogastric tube has been placed. The tip lies off the field of view, presumably within the stomach.  3. Cardiomegaly with diffuse bilateral interstitial airspace opacities. Suspect pulmonary edema superimposed on a background of known interstitial pneumonitis. Electronically Signed   By: Jacqulynn Cadet M.D.   On: 09/29/2019 13:48   Overnight EEG with video  Result Date: 09/30/2019 Lora Havens, MD     09/30/2019  8:00 AM Patient Name: Jahne Krukowski MRN: [id number] Epilepsy Attending: Lora Havens Referring Physician/Provider: Dr Donnetta Simpers Duration: 09/30/2019 0029 to 09/30/2019 0800  Patient history: 49yo F s/o cardiac arrest. EEG to evaluate for seizure  Level of alertness:  comatose  AEDs during EEG study: propofol  Technical aspects: This EEG study was done with scalp electrodes positioned according to the 10-20 International system of  electrode placement. Electrical activity was acquired at a sampling rate of _0  and reviewed with a high frequency filter of _1  and a low frequency filter of _2 . EEG data were recorded continuously and digitally stored.  Description: EEG initlally showed bursts suppression with 2-3 seconds of suppression alternating with 1 second of 3-_3  theta-delta slowing which at times appeared sharply contoured. Gradually, eeg showed generalized backgroudn suppression. EEG was not reactive to tactile stimulation. Hyperventilation and photic stimulation were not performed.    ABNORMALITY -Burst suppression, generalized - Background suppression, generalized  IMPRESSION: This study is suggestive of profound diffuse encephalopathy, nonspecific etiology but likely related to sedation, anoxic/hypoxic brain injury. No seizures or definite epileptiform discharges were seen throughout the recording.  Lora Havens   Portable EEG  Result Date: 09/30/2019 Lora Havens, MD     09/30/2019  7:53 AM Patient Name: Gailene Youkhana MRN: [id number] Epilepsy Attending: Lora Havens Referring Physician/Provider: Dr Donnetta Simpers Date: 09/29/2019 Duration: 22.50 mins Patient history: 49yo F s/o cardiac arrest. EEG to evaluate for seizure Level of alertness:  comatose AEDs during EEG study: propofol Technical aspects: This EEG study was done with scalp electrodes positioned according to the 10-20 International system of electrode placement. Electrical activity was acquired at a sampling rate of _4  and reviewed with a high frequency filter of _5  and a low frequency filter of _6 . EEG data were recorded continuously and digitally stored. Description: EEG showed bursts suppression with 2-3 seconds of suppression alternating with 1 second of 3-_7  theta-delta slowing which at times appeared sharply contoured. Hyperventilation and photic stimulation were not performed.   ABNORMALITY -Burst suppression, generalized  IMPRESSION: This study is suggestive of profound diffuse encephalopathy, nonspecific etiology but likely related to sedation, anoxic/hypoxic brain injury. No seizures or definite epileptiform discharges were seen throughout the recording. Lora Havens   ECHOCARDIOGRAM LIMITED  Result Date: 09/29/2019    ECHOCARDIOGRAM LIMITED REPORT   Patient Name:   RONNAE KASER Date of Exam: 09/29/2019 Medical Rec #:  [id number]          Height:       65.0 in Accession #:    [id number]         Weight:       191.8 lb Date of Birth:  05/27/1970          BSA:          1.943 m Patient Age:    49 years           BP:           111/84 mmHg Patient Gender: F                  HR:           168 bpm. Exam  Location:  Inpatient Procedure: Limited Echo Indications:    Cardiac Arrest  History:        Patient has prior history of Echocardiogram examinations, most                 recent 09/05/2019.  Sonographer:    Merrie Roof RDCS Referring Phys: [place] Comments: This was a limited to echo to show the attending doctor the right ventricle. IMPRESSIONS  1. Very limited study with only 3 images, no Doppler. LV appears small and underfilled with probably low normal LVEF 50-55%. RV is severely dilated with severe systolic dysfunction. Right atrium is also severely dilated, this hasn't changed from the prior study on 09/05/2019.  2. Left ventricular ejection fraction, by estimation, is 50 to 55%. The left ventricle has low normal function. There is the interventricular septum is flattened in systole and diastole, consistent with right ventricular pressure and volume overload.  3. Right ventricular systolic function is severely reduced. The right ventricular size is severely enlarged.  4. Right atrial size was severely dilated. FINDINGS  Left Ventricle: Left ventricular ejection fraction, by estimation, is 50 to 55%. The left ventricle has low normal function. The interventricular septum is flattened in systole and  diastole, consistent with right ventricular pressure and volume overload. Right Ventricle: The right ventricular size is severely enlarged. Right ventricular systolic function is severely reduced. Left Atrium: Left atrial size was normal in size. Right Atrium: Right atrial size was severely dilated. Ena Dawley MD Electronically signed by Ena Dawley MD Signature Date/Time: 09/29/2019/5:26:37 PM    Final     Medications:  Scheduled: . chlorhexidine gluconate (MEDLINE KIT)  15 mL Mouth Rinse BID  . Chlorhexidine Gluconate Cloth  6 each Topical Daily  . cholecalciferol  1,000 Units Per Tube Daily  . digoxin  0.125 mg Per Tube Daily  . docusate  100 mg Oral BID  . escitalopram  10 mg Per Tube Daily  . Gerhardt's butt cream   Topical QID  . mouth rinse  15 mL Mouth Rinse 10 times per day  . methylPREDNISolone (SOLU-MEDROL) injection  80 mg Intravenous Q12H  . midodrine  5 mg Per Tube TID WC  . multivitamin  15 mL Per Tube Daily  . nystatin  5 mL Oral QID  . pantoprazole (PROTONIX) IV  40 mg Intravenous Daily  . polyethylene glycol  17 g Per Tube BID  . senna-docusate  2 tablet Per Tube BID  . sildenafil  40 mg Per Tube TID  . sulfamethoxazole-trimethoprim  1 tablet Per Tube Once per day on Mon Wed Fri  . valproic acid  400 mg Per Tube TID  . vancomycin variable dose per unstable renal function (pharmacist dosing)   Does not apply See admin instructions   Continuous: . amiodarone 30 mg/hr (10/01/19 0900)  . ceFEPime (MAXIPIME) IV Stopped (09/30/19 1715)  . epinephrine 5 mcg/min (10/01/19 0900)  . feeding supplement (VITAL AF 1.2 CAL) Stopped (10/01/19 0530)  . heparin Stopped (10/01/19 0159)  . insulin 1.4 mL/hr at 10/01/19 0900  . mycophenolate (CELLCEPT) IV Stopped (10/01/19 0151)  . norepinephrine (LEVOPHED) Adult infusion 34 mcg/min (10/01/19 0900)  . propofol (DIPRIVAN) infusion 25 mcg/kg/min (10/01/19 0900)    Assessment: 49 y.o.femalewith PMHx significant for end stage  interstitial lung disease and pulmonary HTN with cor pulmonale, hx of DVT on Eliquis(last dose in AM on 8/22), who was admitted with hypoxia. She decompensated on 8/22 in the early afternoon and  had a respiratory arrest. She was intubated and had a cardiac arrest enroute to the ICU. Initial rhythym was PEA/Asystole. CPR initiated with ROSC in 12 mins. Post arrest, poor responsiveness off sedation. Exam with brainstem reflexes intact but was noted to have multiple brief all extremity jerking concerning for myoclonus, no evidence of higher cerebral function on initial neurological exam with no spontaneous purposeful movements, no response to voice/stimulation.  1. Overall presentation most consistent with hypoxic ischemic injury with secondary myoclonus.  2. With her history of DVT on Eliquis, will need to rule out ICH. Basilar thrombus has been considered, but overall suspicion is low; given ISTAT Chem with elevated Cr, hold off on CT Angio. 3. Was loaded with Keppra 2000 mg at 2230 on Sunday. Due to the patient's QT prolongation and risk for torsades de pointes, she was switched to valproic acid. 4. Clinical course yesterday, 8/23:  - Propofol stopped at about 11:30 AM.   - Patient had 2 episodes on myoclonic jerking at about 3:00 PM. Electrographic correlate was present on EEG.  LTM EEG preliminary read from 3 PM yesterday: Multiple pushbutton events were noted. On video, patient was noted to have spontaneous eye opening.  Concomitant EEG showed generalized polyspikes consistent with myoclonic seizures  - Repeat exam off propofol: No evidence for cortical function, unchanged from AM exam. Pupils 6 mm and round, sluggishly reactive to 4 mm bilaterally. No doll's eye reflex. Weak right corneal reflex, absent left corneal. No grimace to noxious. Overbreathes the vent and coughs, but with no gag reflex. Flaccid extremities x 4 with no movement to noxious stimuli. No myoclonic jerking noted at time of exam. .    - Propofol restarted at a rate of 25.  5. LTM EEG report for this AM (Tuesday): This studyissuggestive ofgeneralized epileptogenicity as well as profound diffuseencephalopathy, nonspecific etiology but likely related to sedation, anoxic/hypoxic brain injury. No definite seizures were seen during the study. 6. Ammonia level is normal at 33   Recommendations: 1. Holding propofol starting at about 11 AM  2. Continue LTM EEG. If no electrographic seizures or status myoclonus for several hours after discontinuation of propofol, then can discontinue LTM.  3. Continue valproic acid at 400 mg per tube TID  4. CT head is pending tapering of nitric oxide. The patient is not stable for transport to CT if on nitric oxide per CCM attending and the patient's nurse 5. Neuron specific enolase level is scheduled for Wednesday to aid in prognostication   35 minutes spent in the neurological evaluation and management of this critically ill patient.    LOS: 28 days   _0  signed: Dr. Kerney Elbe 10/01/2019  9:19 AM

## 2019-10-01 NOTE — Progress Notes (Signed)
NAME:  Felicia [name], MRN:  [id number], DOB:  1970/03/27, LOS: 34 ADMISSION DATE:  08/08/2019, CONSULTATION DATE: 09/29/2019 REFERRING MD:  Delphia Grates, MD CHIEF COMPLAINT:  Respiratory distress, worsening hypoxia   Brief History   49 year old female with PMHx of fibrotic NSIP and severe pulmonary hypertension on 10-15L O2 at baseline, right sided heart failure, IDDM, and Hx of DVT on Eliquis presented with worsening hypoxia. Acutely decompensated on 8/22 with respiratory arrest for which intubated. Following this, had cardiac arrest while being transported to ICU s/p CPR with ROSC after 12 minutes of intermittent interventions. Currently in ICU on mechanical ventilation, levophed and epinephrine drips and amiodarone gtt for SVT.   Past Medical History   Past Medical History:  Diagnosis Date  . Diabetes mellitus without complication ([place])    type 2   . Hypertension   . NSIP (nonspecific interstitial pneumonitis) ([place])   . Pulmonary hypertension ([place]) 2011  . Respiratory failure with hypoxia ([place]) 05/31/2019  . SVT (supraventricular tachycardia) ([place])    Significant Hospital Events   7/27 > admitted 7/28 > PCCM consulted 7/30 > palliative care consulted. Patient remains steadfast in hoping for ongoing pulmonary improvement 8/2 > slight decrease in O2 support 8/6 >  A fib with RVR and hypotension, transferred to ICU 8/22 > intubation for hypoxia; cardiac arrest 2/2 respiratory arrest with ROSC after 12 minutes  8/24 > remains on max ventilatory support; continuous EEG consistent with anoxic brain injury  Consults:  Palliative care Neurology  Procedures:  Rt PICC 8/3 >>  ETT 8/22 >> R femoral A line 8/22 >> R femoral CVC 8/22 >>   Significant Diagnostic Tests:   Echocardiogram 04/2019 >> normal LV function, RVSP 64 , enlarged RV, bubble study negative.  PFTs 02/2017 >> FVC 1.25 L 36% predicted, FEV1 1.18 L 40% predicted with DLCO of 17   HRCT 02/2019 >> chronic fibrotic ILD  progressed from 2018 with mild focus of honeycombing  Cath 3/ 2021 >> right atrial pressure was 7, PA pressure 60/24 with a mean of 39 and a wedge of 10 her cardiac index was low at 1.91 and output of 3.89. Her PVR is thus 7.  Echo 09/05/19 >> EF 50 to 55%, severe RV systolic dysfx, RVSP 29.5, mod/severe TR  CT chest 09/06/19 >> patchy GGO, interstitial coarsening and traction BTX with basilar predominance, no change compared to December 2018  CXR 09/29/2019 >> Low lung volumes. Diffuse patchy reticular and hazy opacities in both lungs appear similar to slightly worsened in lower lungs; favor chronic interstitial lung disease with superimposed atelectasis; although a superimposed aspiration or developing pneumonia in the lower left lung cannot be excluded.   CXR 09/29/2019 >> interval intubation with ETT in place; NGT in place; cardiomegaly with diffuse bilateral interstitial airspace opacities - pulmonary edema superimposed on background of known interstitial pneumonitis   EEG 8/23 > evidence of myoclonic seizures when propofol stopped; was propofol restarted, EEG suggestive of profound diffuse encephalopathy, nonspecific etiology but likely related to sedation, anoxic/hypoxic brain injury  EEG 8/24 > generalized epileptogenicity and profound diffuse encephalopathy, nonspecific etiology but likely related to sedation, anoxic/hypoxic brain injury   Micro Data:  SARS CoV-2 > Negative Blood Cx > Negative Urine Cx 7/28 > Negative MRSA screen > negative  Urine Cx 8/19 > Negative   Antimicrobials:  Bactrim 8/17>8/20, 8/23> Zosyn 8/22 Vancomycin 8/22  Cefepime 8/23>   Interim history/subjective:  Patient with myoclonic seizures when off propofol gtt yesterday requiring versed and  resumption of the propofol gtt. Continues to have generalized epileptogenicity on EEG on propofol.  Overnight, noted to have bleeding from arterial line site despite epinephrine gtt and localized epinephrine  injections.   Objective   Blood pressure (!) 93/58, pulse 85, temperature 98.6 F (37 C), temperature source Oral, resp. rate 14, height _0  (1.626 m), weight 85.2 kg, SpO2 100 %.    Vent Mode: PRVC FiO2 (%):  [90 %-100 %] 90 % Set Rate:  [28 bmp] 28 bmp Vt Set:  [440 mL] 440 mL PEEP:  [14 cmH20] 14 cmH20 Plateau Pressure:  [26 cmH20-41 cmH20] 26 cmH20   Intake/Output Summary (Last 24 hours) at 10/01/2019 0737 Last data filed at 10/01/2019 0700 Gross per 24 hour  Intake 3356.31 ml  Output 75 ml  Net 3281.31 ml   Filed Weights   09/23/19 0423 09/30/19 0500 10/01/19 0500  Weight: 87 kg 82.1 kg 85.2 kg    Examination: General: critically ill appearing obese female, unresponsive on minimal sedation; intubated HENT: [place]/AT, ETT in place, pupils dilated and sluggishly reactive to light; MMM; anicteric sclerae Lungs: bilateral rales  Cardiovascular: S1 and S2 present; no m/r/g Abdomen: soft, nondistended; +bowel sounds Extremities: warm and dry; distal pulses intact Neuro: unresponsive to verbal or painful stimuli; pupils dilated and minimally reactive; no corneal reflex or dolls eyes; no cough or gag reflex appreciated   Resolved Hospital Problem list    Assessment & Plan:  Acute on chronic hypoxic respiratory failure secondary to advanced NSIP Cardiac arrest secondary to respiratory failure  Currently on  full vent support with broad spectrum antibiotics and high dose steroids; on levophed and epinephrine for pressor support Also on continuous nitrous oxide  - Continue mechanical ventilation; TV 4-8cc/kg, SpO2 >90% - Wean FiO2 as able based on ABG's; will attempt to wean of nitrous oxide as able  - Continue broad spectrum coverage with cefepime and bactrim - Continue solumedrol 37m bid - VAP per protocol - Continue CellcCept home dosing  Acute anoxic encephalopathy S/p cardiac arrest; Had episode of myoclonic seizures when weaned off propofol gtt on 8/23 for which she was  resumed on propofol.  Currently on minimal sedation; EEG suggestive of diffuse encephalopathy 2/2 anoxic/hypoxic brain injury  - Continue EEG monitoring - CT Head wo Contrast as able for neuroprognostication - Valproic acid per neurology  Atrial fibrillation with RVR  SVT s/p respiratory distress  - Continue amiodarone and epi gtt, wean as able  - Heparin per pharmacy dosing  - K > 4, Mag >2  Severe fibrotic NSIP  Group 1,2,3 pulmonary hypertension - Maintain euvolemia and wean FiO2 as able - Nitrous oxide; will attempt to wean as able   DM II with steroid induced hyperglycemia - Currently on insulin gtt with improvement in hyperglycemia - Will wean off insulin gtt as able for goal CBG <180.   Oligouric acute renal failure Worsening creatinine in setting of recent cardiac arrest and shock with minimal urine output.  - Trend renal function  - Avoid nephrotoxic agents as able  - Maintain MAP >65 for optimization of renal perfusion  Best practice:  Diet: tube feeds Pain/Anxiety/Delirium protocol (if indicated): per protocol VAP protocol (if indicated): per protocol DVT prophylaxis: SCDs GI prophylaxis: PPI Glucose control: insulin gtt Mobility: bed rest Code Status: FULL Family Communication: GPatriotdiscussion with family @ 1pm today  Disposition: ICU  Critical care time: 35 minutes    SHarvie Heck MD Internal Medicine, PGY-2 10/01/19 7:37 AM Pager # [phone]

## 2019-10-01 NOTE — Procedures (Addendum)
Patient Name:Felicia [name] SEL:[id number] Epilepsy Attending:Rondal Vandevelde Barbra Sarks Referring Physician/Provider:Dr Donnetta Simpers Duration:10/01/2019 3568 to 10-08-2019 0029  Patient history:48yo F s/o cardiac arrest. EEG to evaluate for seizure  Level of alertness:comatose  AEDs during EEG study:propofol  Technical aspects: This EEG study was done with scalp electrodes positioned according to the 10-20 International system of electrode placement. Electrical activity was acquired at a sampling rate of _0  and reviewed with a high frequency filter of _1  and a low frequency filter of _2 . EEG data were recorded continuously and digitally stored.   Description:EEG initially showed generalized background suppression.  Around 5 AM on 10/01/2019, EEG briefly showed burst suppression pattern with bursts of generalized polyspikes lasting 0.5 to 1 seconds alternating with EEG suppression lasting 3 to 4 seconds for about 1 hour when patient was stimulated.  EEG again showed continuous generalized background suppression.  Propofol was then weaned off after which around 1530 on 10/01/2019, EEG again started showing generalized, maximal bifrontal 5-_3  sharply contoured theta- alpha activity lasting for few minutes at a time followed by generalized background suppression.  EEG was reactive to tactile stimulation  ABNORMALITY -Background suppression, generalized -Burst suppression, generalized  IMPRESSION: This study is suggestive ofgeneralized epileptogenicity as well as profound diffuseencephalopathy, nonspecific etiology but likely related to sedation, anoxic/hypoxic brain injury. No definite seizures were seen during the study.  Eren Ryser Barbra Sarks

## 2019-10-01 NOTE — Progress Notes (Signed)
Patient ID: Felicia [name], female   DOB: 1970/05/08, 49 y.o.   MRN: [id number]  This NP reviewed medical records, received report from team, assessed the patient and then met with the patient's family to include her daughter, her sister and her mother for scheduled meeting to discuss current medical situation and GOCs.  Education offered regarding current medical situation; multiple comorbidities /underlying pulmonary and cardiac conditions, unfortunately anoxic encephalopathy secondary to cardiac arrest,   worsening renal function and grim prognosis.  All understand the situation and moving to a comfort path for patient,  focusing on comfort and dignity.  Education offered on the difference between an aggressive medical intervention path and a palliative comfort path. Education offered on logistics of liberating patient form the ventilator and supporting patient as she transitions at EOL.    Prognosis is likely hours to days after extubation.  Family has made decision for one way extubate tomorrow at 2:00 pm and allow a natural death.    Daughter will be in to visit at 10:00 and other family will visit as discussed with nursing today under the visitation policy for EOL/comfort care patient.  Continue all current medical interventions but no escalation of care, partial code documented/no compression or cardioversion. Educated family that anything could happen at anytime, patient's condition is tenuous.  Questions and concerns addressed   PMT will continue to support holistically  Total time spent on the unit was 60 minutes   Discussed with Dr Carlis Abbott via secure chat  Greater than 50% of the time was spent in counseling and coordination of care  Wadie Lessen NP  Palliative Medicine Team Team Phone # [phone] Pager [phone]

## 2019-10-01 NOTE — Progress Notes (Signed)
ANTICOAGULATION CONSULT NOTE   Pharmacy Consult for heparin  Indication: history of DVT  No Active Allergies  Patient Measurements: Height: _0  (162.6 cm) Weight: 82.1 kg (181 lb) IBW/kg (Calculated) : 54.7  Heparin dosing wt: 75kg    Vital Signs: Temp: 98.9 F (37.2 C) (08/24 0002) Temp Source: Oral (08/24 0002) BP: 90/50 (08/24 0300) Pulse Rate: 90 (08/24 0300)  Labs: Recent Labs    09/29/19 1430 09/29/19 1604 09/29/19 1839 09/29/19 2256 09/29/19 2301 09/29/19 2301 09/30/19 0500 09/30/19 0500 09/30/19 0753 09/30/19 0758 09/30/19 0834 09/30/19 1411 10/01/19 0212  HGB 11.4*   < >  --    < > 13.3   < > 10.1*  --   --   --   --   --  9.7*  HCT 40.8   < >  --    < > 39.0  --  35.8*  --   --   --   --   --  35.4*  PLT 238  --   --   --   --   --  274  --   --   --   --   --  230  APTT  --   --   --   --   --   --  >200*   < >  --   --  >200* 38* 96*  LABPROT  --   --   --   --   --   --   --   --   --  31.1*  --   --  22.3*  INR  --   --   --   --   --   --   --   --   --  3.1*  --   --  2.0*  HEPARINUNFRC  --   --   --   --   --   --  >2.20*  --   --   --  >2.20*  --   --   CREATININE 1.21*   < >  --   --  1.30*  --  1.82*  --  2.00*  --   --   --   --   TROPONINIHS 198*  --  373*  --   --   --   --   --   --   --   --   --   --    < > = values in this interval not displayed.    Estimated Creatinine Clearance: 35.3 mL/min (A) (by C-G formula based on SCr of 2 mg/dL (H)).   Assessment: 49 yo female s/p code blue with CPR and now VDRF. She is on apixaban for history of VTE. Pharmacy consulted to dose heparin while oral anticoagulation is on hold. -hg= 10.1, SCr= 0.9 > 2 -last apixaban dose ~ 10am 8/22 so affecting heparin levels  PTT 96 sec (therapeutic). Noted heparin off at 0200 for R groin bleeding around femoral art line insertion site. This level should be reflective of heparin at 800 units/hr since heparin only off ~10 min prior to labs drawn.  Goal of  Therapy:  Heparin level 0.3-0.7 units/ml aPTT 66-102 seconds Monitor platelets by anticoagulation protocol: Yes   Plan:   -F/u bleeding resolution and ability to restart heparin  Sherlon Handing, PharmD, BCPS Please see amion for complete clinical pharmacist phone list  10/01/2019 3:13 AM

## 2019-10-01 NOTE — Progress Notes (Signed)
Visit made to patients room for routine ventilator check.  RT found patient clamped down on ET tube and her tongue to the point of bleeding.  RN gave patient meds to help her relax and we inserted a bite block to prevent tube obstruction as well as oral injury.Patients SP02 remained stable throughout entire process.

## 2019-10-01 NOTE — Telephone Encounter (Signed)
Called and spoke with Felicia [name], [facility].  Felicia [name] stated they received a refill request for furosemide, with no amount to distribute, no direction, no refills. Per Patient's chart, no refills have been submitted from [facility] this week for Patient.  Furosemide is not listed on current med list, and LOV with [name] was1/25/21. Felicia [name] stated she would follow up with PCP. Nothing further at this time.

## 2019-10-01 NOTE — Progress Notes (Signed)
Inpatient Diabetes Program Recommendations  AACE/ADA: New Consensus Statement on Inpatient Glycemic Control (2015)  Target Ranges:  Prepandial:   less than 140 mg/dL      Peak postprandial:   less than 180 mg/dL (1-2 hours)      Critically ill patients:  140 - 180 mg/dL   Lab Results  Component Value Date   GLUCAP 153 (H) 10/01/2019   HGBA1C 10.2 (H) 09/19/2019    Review of Glycemic Control Results for Felicia [name], Felicia [name] (MRN [id number]) as of 10/01/2019 10:01  Ref. Range 10/01/2019 03:36 10/01/2019 03:53 10/01/2019 04:12 10/01/2019 04:51 10/01/2019 06:17  Glucose-Capillary Latest Ref Range: 70 - 99 mg/dL 240 (H) 234 (H) 231 (H) 201 (H) 153 (H)   Diabetes history: DM 2 Outpatient Diabetes medications: Lantus 30 units bid, Humalog 0-20 units bid, Metformin 1000 mg q HS Current orders for Inpatient glycemic control:  Solumedrol 80 mg IV q 12 hours IV insulin Inpatient Diabetes Program Recommendations:    Blood sugars improving on IV insulin.   When appropriate, consider using ICU glycemic control order set phase 3 for transition.  Will follow.  Patient does have history of low blood sugars so will need to be conservative with transition doses.    Thanks  Adah Perl, RN, BC-ADM Inpatient Diabetes Coordinator Pager [phone] (8a-5p)

## 2019-10-01 NOTE — Progress Notes (Signed)
ANTICOAGULATION CONSULT NOTE  Pharmacy Consult for heparin  Indication: history of DVT  No Active Allergies  Patient Measurements: Height: _0  (162.6 cm) Weight: 85.2 kg (187 lb 13.3 oz) IBW/kg (Calculated) : 54.7  Heparin dosing wt: 75kg    Vital Signs: Temp: 98.6 F (37 C) (08/24 0453) Temp Source: Oral (08/24 0453) BP: 103/63 (08/24 1000) Pulse Rate: 82 (08/24 1000)  Labs: Recent Labs    09/29/19 1430 09/29/19 1604 09/29/19 1839 09/29/19 2256 09/30/19 0500 09/30/19 0500 09/30/19 0753 09/30/19 0758 09/30/19 0834 09/30/19 0834 09/30/19 1411 10/01/19 0212 10/01/19 0819  HGB 11.4*   < >  --    < > 10.1*   < >  --   --   --   --   --  9.7* 9.8*  HCT 40.8   < >  --    < > 35.8*  --   --   --   --   --   --  35.4* 34.9*  PLT 238   < >  --   --  274  --   --   --   --   --   --  230 245  APTT  --   --   --   --  >200*   < >  --   --  >200*   < > 38* 96* 41*  LABPROT  --   --   --   --   --   --   --  31.1*  --   --   --  22.3*  --   INR  --   --   --   --   --   --   --  3.1*  --   --   --  2.0*  --   HEPARINUNFRC  --   --   --   --  >2.20*  --   --   --  >2.20*  --   --  1.58*  --   CREATININE 1.21*  --   --    < > 1.82*  --  2.00*  --   --   --   --  2.73*  --   TROPONINIHS 198*  --  373*  --   --   --   --   --   --   --   --   --   --    < > = values in this interval not displayed.    Estimated Creatinine Clearance: 26.3 mL/min (A) (by C-G formula based on SCr of 2.73 mg/dL (H)).   Medical History: Past Medical History:  Diagnosis Date  . Diabetes mellitus without complication ([place])    type 2   . Hypertension   . NSIP (nonspecific interstitial pneumonitis) ([place])   . Pulmonary hypertension ([place]) 2011  . Respiratory failure with hypoxia ([place]) 05/31/2019  . SVT (supraventricular tachycardia) (HCC)     Medications:  Medications Prior to Admission  Medication Sig Dispense Refill Last Dose  . acetaminophen (TYLENOL) 500 MG tablet Take 2,000 mg by mouth as  needed for moderate pain.    unk  . albuterol (PROVENTIL HFA;VENTOLIN HFA) 108 (90 Base) MCG/ACT inhaler Inhale 1 puff into the lungs every 6 (six) hours as needed for wheezing or shortness of breath.   unk  . apixaban (ELIQUIS) 5 MG TABS tablet Take 1 tablet (5 mg total) by mouth 2 (two) times daily. 60 tablet 5 08/10/2019 at 01000  .  atenolol (TENORMIN) 50 MG tablet Take 75 mg by mouth 2 (two) times daily.    08/15/2019 at 01000  . calcium citrate (CALCITRATE - DOSED IN MG ELEMENTAL CALCIUM) 950 (200 Ca) MG tablet Take 200 mg of elemental calcium by mouth daily.   08/31/2019  . cholecalciferol (VITAMIN D3) 25 MCG (1000 UT) tablet Take 1,000 Units by mouth daily.   Past Week at Unknown time  . escitalopram (LEXAPRO) 10 MG tablet Take 10 mg by mouth daily.   08/18/2019 at Unknown time  . furosemide (LASIX) 20 MG tablet Take 3 tablets (60 mg total) by mouth 2 (two) times daily. 180 tablet 0 08/29/2019 at Unknown time  . guaiFENesin (MUCINEX) 600 MG 12 hr tablet Take 600 mg by mouth daily.    08/18/2019 at Unknown time  . ibuprofen (ADVIL) 200 MG tablet Take 400 mg by mouth every 6 (six) hours as needed for moderate pain.   Past Week at Unknown time  . insulin lispro (HUMALOG KWIKPEN) 100 UNIT/ML KwikPen Inject 0-20 Units into the skin 2 (two) times daily. Sliding scale based on sugar level   09/01/2019  . ipratropium-albuterol (DUONEB) 0.5-2.5 (3) MG/3ML SOLN Inhale 3 mLs into the lungs every 6 (six) hours as needed (sob and wheezing).    Past Week at Unknown time  . metFORMIN (GLUCOPHAGE-XR) 500 MG 24 hr tablet Take 1,000 mg by mouth at bedtime.    09/02/2019  . mycophenolate (CELLCEPT) 500 MG tablet Take 1,500 mg by mouth 2 (two) times daily.    08/11/2019 at Unknown time  . predniSONE (DELTASONE) 10 MG tablet TAKE 4 TABLETS BY MOUTH DAILY WITH BREAKFAST (Patient taking differently: Take 40 mg by mouth daily with breakfast. ) 120 tablet 1 08/27/2019 at Unknown time  . sildenafil (REVATIO) 20 MG tablet Take 20  mg by mouth 2 (two) times daily.   09/02/2019  . sulfamethoxazole-trimethoprim (BACTRIM DS) 800-160 MG tablet Take 1 tablet by mouth 3 (three) times a week. As needed for sinus infection   unk  . verapamil (CALAN-SR) 120 MG CR tablet Take 1 tablet (120 mg total) by mouth at bedtime. 30 tablet 0 09/02/2019  . gabapentin (NEURONTIN) 300 MG capsule Take 1 capsule (300 mg total) by mouth 3 (three) times daily for 7 days. 21 capsule 0   . insulin degludec (TRESIBA FLEXTOUCH) 100 UNIT/ML FlexTouch Pen Inject 24 Units into the skin daily.   09/01/2019  . LANTUS SOLOSTAR 100 UNIT/ML Solostar Pen Inject 30 Units into the skin at bedtime.     . potassium chloride SA (KLOR-CON) 20 MEQ tablet Take 2 tablets (40 mEq total) by mouth daily. 60 tablet 0    Scheduled:  . chlorhexidine gluconate (MEDLINE KIT)  15 mL Mouth Rinse BID  . Chlorhexidine Gluconate Cloth  6 each Topical Daily  . cholecalciferol  1,000 Units Per Tube Daily  . docusate  100 mg Per Tube BID  . escitalopram  10 mg Per Tube Daily  . Gerhardt's butt cream   Topical QID  . mouth rinse  15 mL Mouth Rinse 10 times per day  . methylPREDNISolone (SOLU-MEDROL) injection  80 mg Intravenous Q12H  . midodrine  5 mg Per Tube TID WC  . multivitamin  15 mL Per Tube Daily  . nystatin  5 mL Oral QID  . pantoprazole (PROTONIX) IV  40 mg Intravenous Daily  . polyethylene glycol  17 g Per Tube BID  . senna-docusate  2 tablet Per Tube BID  . sildenafil  40 mg Per Tube TID  . sulfamethoxazole-trimethoprim  1 tablet Per Tube Once per day on Mon Wed Fri  . valproic acid  400 mg Per Tube TID  . vancomycin variable dose per unstable renal function (pharmacist dosing)   Does not apply See admin instructions    Assessment: 49 yo female s/p code blue with CPR and now VDRF. She is on apixaban for history of VTE. Pharmacy consulted to dose heparin while oral anticoagulation is on hold. APTT 96 sec (therapeutic) overnight. Noted heparin off at 0200 for R groin  bleeding around femoral art line insertion site. This level should be reflective of heparin at 800 units/hr since heparin only off ~10 min prior to labs drawn.   Heparin continues to be off this morning with continued bleeding.   Family meeting this afternoon to discuss goal of care.  Goal of Therapy:  Heparin level 0.3-0.7 units/ml aPTT 66-102 seconds Monitor platelets by anticoagulation protocol: Yes   Plan:   Hold heparin until deemed safe from bleeding standpoint to restart  Erin Hearing PharmD., BCPS Clinical Pharmacist 10/01/2019 11:44 AM

## 2019-10-01 NOTE — Plan of Care (Signed)
  Problem: Clinical Measurements: Goal: Ability to maintain clinical measurements within normal limits will improve Outcome: Not Progressing   Problem: Clinical Measurements: Goal: Will remain free from infection Outcome: Not Progressing   Problem: Clinical Measurements: Goal: Diagnostic test results will improve Outcome: Not Progressing   Problem: Clinical Measurements: Goal: Respiratory complications will improve Outcome: Not Progressing   Problem: Clinical Measurements: Goal: Cardiovascular complication will be avoided Outcome: Not Progressing

## 2019-10-02 DIAGNOSIS — R0609 Other forms of dyspnea: Secondary | ICD-10-CM

## 2019-10-02 LAB — URINALYSIS, ROUTINE W REFLEX MICROSCOPIC
Bilirubin Urine: NEGATIVE
Glucose, UA: 50 mg/dL — AB
Hgb urine dipstick: NEGATIVE
Ketones, ur: NEGATIVE mg/dL
Nitrite: NEGATIVE
Protein, ur: 100 mg/dL — AB
Specific Gravity, Urine: 1.024 (ref 1.005–1.030)
pH: 5 (ref 5.0–8.0)

## 2019-10-02 LAB — BASIC METABOLIC PANEL
Anion gap: 20 — ABNORMAL HIGH (ref 5–15)
BUN: 50 mg/dL — ABNORMAL HIGH (ref 6–20)
CO2: 18 mmol/L — ABNORMAL LOW (ref 22–32)
Calcium: 7.3 mg/dL — ABNORMAL LOW (ref 8.9–10.3)
Chloride: 95 mmol/L — ABNORMAL LOW (ref 98–111)
Creatinine, Ser: 3.33 mg/dL — ABNORMAL HIGH (ref 0.44–1.00)
GFR calc Af Amer: 18 mL/min — ABNORMAL LOW (ref >60)
GFR calc non Af Amer: 15 mL/min — ABNORMAL LOW (ref >60)
Glucose, Bld: 119 mg/dL — ABNORMAL HIGH (ref 70–99)
Potassium: 5 mmol/L (ref 3.5–5.1)
Sodium: 133 mmol/L — ABNORMAL LOW (ref 135–145)

## 2019-10-02 LAB — TRIGLYCERIDES: Triglycerides: 112 mg/dL (ref <150)

## 2019-10-02 LAB — CBC
HCT: 26.2 % — ABNORMAL LOW (ref 36.0–46.0)
Hemoglobin: 7.8 g/dL — ABNORMAL LOW (ref 12.0–15.0)
MCH: 24.5 pg — ABNORMAL LOW (ref 26.0–34.0)
MCHC: 29.8 g/dL — ABNORMAL LOW (ref 30.0–36.0)
MCV: 82.4 fL (ref 80.0–100.0)
Platelets: 156 10*3/uL (ref 150–400)
RBC: 3.18 MIL/uL — ABNORMAL LOW (ref 3.87–5.11)
RDW: 19.2 % — ABNORMAL HIGH (ref 11.5–15.5)
WBC: 11.1 10*3/uL — ABNORMAL HIGH (ref 4.0–10.5)
nRBC: 4.5 % — ABNORMAL HIGH (ref 0.0–0.2)

## 2019-10-02 LAB — CREATININE, URINE, RANDOM: Creatinine, Urine: 46.55 mg/dL

## 2019-10-02 LAB — GLUCOSE, CAPILLARY
Glucose-Capillary: 136 mg/dL — ABNORMAL HIGH (ref 70–99)
Glucose-Capillary: 157 mg/dL — ABNORMAL HIGH (ref 70–99)
Glucose-Capillary: 132 mg/dL — ABNORMAL HIGH (ref 70–99)
Glucose-Capillary: 121 mg/dL — ABNORMAL HIGH (ref 70–99)
Glucose-Capillary: 118 mg/dL — ABNORMAL HIGH (ref 70–99)
Glucose-Capillary: 120 mg/dL — ABNORMAL HIGH (ref 70–99)
Glucose-Capillary: 148 mg/dL — ABNORMAL HIGH (ref 70–99)

## 2019-10-02 LAB — SODIUM, URINE, RANDOM: Sodium, Ur: 29 mmol/L

## 2019-10-02 LAB — MAGNESIUM: Magnesium: 2 mg/dL (ref 1.7–2.4)

## 2019-10-02 LAB — NEURON-SPECIFIC ENOLASE(NSE), BLOOD: Neuron-specific Enolase, Serum: 63.1 ng/mL — ABNORMAL HIGH (ref 0.0–17.6)

## 2019-10-02 MED ORDER — DIPHENHYDRAMINE HCL 50 MG/ML IJ SOLN: 25.0000 mg | INTRAMUSCULAR | Status: DC | PRN

## 2019-10-02 MED ORDER — POLYVINYL ALCOHOL 1.4 % OP SOLN
1.0000 [drp] | Freq: Four times a day (QID) | OPHTHALMIC | Status: DC | PRN
  Filled 2019-10-02: qty 15

## 2019-10-02 MED ORDER — FENTANYL CITRATE (PF) 100 MCG/2ML IJ SOLN: 50.0000 ug | INTRAMUSCULAR | Status: DC | PRN

## 2019-10-02 MED ORDER — MIDAZOLAM BOLUS VIA INFUSION (WITHDRAWAL LIFE SUSTAINING TX)
2.0000 mg | INTRAVENOUS | Status: DC | PRN
  Filled 2019-10-02: qty 2

## 2019-10-02 MED ORDER — MIDAZOLAM HCL 2 MG/2ML IJ SOLN: 1.0000 mg | INTRAMUSCULAR | Status: DC | PRN

## 2019-10-02 MED ORDER — FENTANYL BOLUS VIA INFUSION
100.0000 ug | INTRAVENOUS | Status: DC | PRN
  Filled 2019-10-02: qty 100

## 2019-10-02 MED ORDER — MIDAZOLAM 50MG/50ML (1MG/ML) PREMIX INFUSION
0.0000 mg/h | INTRAVENOUS | Status: DC
  Administered 2019-10-02: 1 mg/h via INTRAVENOUS
  Filled 2019-10-02: qty 50

## 2019-10-02 MED ORDER — GLYCOPYRROLATE 1 MG PO TABS
1.0000 mg | ORAL_TABLET | ORAL | Status: DC | PRN
  Filled 2019-10-02: qty 1

## 2019-10-02 MED ORDER — MYCOPHENOLATE MOFETIL HCL 500 MG IV SOLR: 1500.0000 mg | Freq: Two times a day (BID) | INTRAVENOUS | Status: DC

## 2019-10-02 MED ORDER — FENTANYL 2500MCG IN NS 250ML (10MCG/ML) PREMIX INFUSION
0.0000 ug/h | INTRAVENOUS | Status: DC
  Administered 2019-10-02: 250 ug/h via INTRAVENOUS
  Filled 2019-10-02: qty 250

## 2019-10-02 MED ORDER — ACETAMINOPHEN 325 MG PO TABS: 650.0000 mg | ORAL_TABLET | Freq: Four times a day (QID) | ORAL | Status: DC | PRN

## 2019-10-02 MED ORDER — GLYCOPYRROLATE 0.2 MG/ML IJ SOLN: 0.2000 mg | INTRAMUSCULAR | Status: DC | PRN

## 2019-10-02 MED ORDER — ACETAMINOPHEN 650 MG RE SUPP: 650.0000 mg | Freq: Four times a day (QID) | RECTAL | Status: DC | PRN

## 2019-10-09 NOTE — Progress Notes (Signed)
Time of death _0 .26. Pronounced by 2 RN's.

## 2019-10-09 NOTE — Progress Notes (Signed)
NAME:  Felicia [name], MRN:  [id number], DOB:  1970-06-07, LOS: 7 ADMISSION DATE:  08/23/2019, CONSULTATION DATE: 09/29/2019 REFERRING MD:  Delphia Grates, MD CHIEF COMPLAINT:  Respiratory distress, worsening hypoxia   Brief History   49 year old female with PMHx of fibrotic NSIP and severe pulmonary hypertension on 10-15L O2 at baseline, right sided heart failure, IDDM, and Hx of DVT on Eliquis presented with worsening hypoxia. Acutely decompensated on 8/22 with respiratory arrest for which intubated. Following this, had cardiac arrest while being transported to ICU s/p CPR with ROSC after 12 minutes of intermittent interventions. Currently in ICU on mechanical ventilation, levophed and epinephrine drips and amiodarone gtt for SVT.    Past Medical History   Past Medical History:  Diagnosis Date  . Diabetes mellitus without complication ([place])    type 2   . Hypertension   . NSIP (nonspecific interstitial pneumonitis) ([place])   . Pulmonary hypertension ([place]) 2011  . Respiratory failure with hypoxia ([place]) 05/31/2019  . SVT (supraventricular tachycardia) ([place])    Significant Hospital Events   7/27 > admitted 7/28 > PCCM consulted 7/30 > palliative care consulted. Patient remains steadfast in hoping for ongoing pulmonary improvement 8/2 > slight decrease in O2 support 8/6 >  A fib with RVR and hypotension, transferred to ICU 8/22 > intubation for hypoxia; cardiac arrest 2/2 respiratory arrest with ROSC after 12 minutes  8/24 > remains on max ventilatory support; continuous EEG consistent with anoxic brain injury  Consults:  Palliative care Neurology  Procedures:  Rt PICC 8/3 >>  ETT 8/22 >> R femoral A line 8/22 >> R femoral CVC 8/22 >>   Significant Diagnostic Tests:   Echocardiogram 04/2019 >> normal LV function, RVSP 64 , enlarged RV, bubble study negative.  PFTs 02/2017 >> FVC 1.25 L 36% predicted, FEV1 1.18 L 40% predicted with DLCO of 17   HRCT 02/2019 >> chronic fibrotic ILD  progressed from 2018 with mild focus of honeycombing  Cath 3/ 2021 >> right atrial pressure was 7, PA pressure 60/24 with a mean of 39 and a wedge of 10 her cardiac index was low at 1.91 and output of 3.89. Her PVR is thus 7.  Echo 09/05/19 >> EF 50 to 55%, severe RV systolic dysfx, RVSP 83.2, mod/severe TR  CT chest 09/06/19 >> patchy GGO, interstitial coarsening and traction BTX with basilar predominance, no change compared to December 2018  CXR 09/29/2019 >> Low lung volumes. Diffuse patchy reticular and hazy opacities in both lungs appear similar to slightly worsened in lower lungs; favor chronic interstitial lung disease with superimposed atelectasis; although a superimposed aspiration or developing pneumonia in the lower left lung cannot be excluded.   CXR 09/29/2019 >> interval intubation with ETT in place; NGT in place; cardiomegaly with diffuse bilateral interstitial airspace opacities - pulmonary edema superimposed on background of known interstitial pneumonitis   EEG 8/23 > evidence of myoclonic seizures when propofol stopped; was propofol restarted, EEG suggestive of profound diffuse encephalopathy, nonspecific etiology but likely related to sedation, anoxic/hypoxic brain injury  EEG 8/24 > generalized epileptogenicity and profound diffuse encephalopathy, nonspecific etiology but likely related to sedation, anoxic/hypoxic brain injury  EEG 8/25 > profound diffuse encephalopathy, likely related to anoxic/hypoxic brain injury. No definite seizures noted.   Micro Data:  SARS CoV-2 > Negative Blood Cx > Negative Urine Cx 7/28 > Negative MRSA screen > negative  Urine Cx 8/19 > Negative   Antimicrobials:  Bactrim 8/17>8/20, 8/23> Zosyn 8/22 Vancomycin 8/22  Cefepime  8/23>   Interim history/subjective:  Remains on ventilator support and unresponsive to verbal or painful stimuli. Goals of care discussion with family yesterday and family decided for withdrawal of care today.    Objective   Blood pressure 108/74, pulse 89, temperature 97.8 F (36.6 C), temperature source Oral, resp. rate (!) 33, height _0  (1.626 m), weight 86.7 kg, SpO2 100 %.    Vent Mode: PRVC FiO2 (%):  [40 %-100 %] 60 % Set Rate:  [28 bmp-33 bmp] 33 bmp Vt Set:  [440 mL] 440 mL PEEP:  [14 cmH20] 14 cmH20 Plateau Pressure:  [19 cmH20-34 cmH20] 23 cmH20   Intake/Output Summary (Last 24 hours) at 10-06-2019 0809 Last data filed at 2019-10-06 0700 Gross per 24 hour  Intake 2778.52 ml  Output 490 ml  Net 2288.52 ml   Filed Weights   09/30/19 0500 10/01/19 0500 Oct 06, 2019 0415  Weight: 82.1 kg 85.2 kg 86.7 kg    Examination: General: critically ill appearing obese female, unresponsive on minimal sedation; intubated HENT: [place]/AT, ETT in place, pupils dilated and sluggishly reactive to light; MMM; anicteric sclerae Lungs: bilateral rales  Cardiovascular: S1 and S2 present; no m/r/g Abdomen: soft, nondistended; +bowel sounds Extremities: warm and dry; distal pulses intact Neuro: unresponsive to verbal or painful stimuli; pupils dilated and minimally reactive; no corneal reflex or dolls eyes; no cough or gag reflex appreciated   Resolved Hospital Problem list    Assessment & Plan:  Acute on chronic hypoxic respiratory failure secondary to advanced NSIP Cardiac arrest secondary to respiratory failure  Currently on full vent support; family elected for comfort care - Comfort care measures initiated  Acute anoxic encephalopathy S/p cardiac arrest; Currently unresponsive to any stimuli; off sedation. No further seizure activity noted. EEG consistent with anoxic/hypoxic brain injury.  - Comfort care measures   Severe fibrotic NSIP  Group 1,2,3 pulmonary hypertension. Required nitrous oxide to maintain oxygenation on ventilator. Will wean off given family wishes for comfort care at this time.   DM II with steroid induced hyperglycemia Discontinuing insulin gtt in setting of comfort  care.   Oligouric acute renal failure Worsening creatinine in setting of recent cardiac arrest and shock with minimal urine output.    GOC: Family elected for withdrawal of life support measures 8/25 at 2pm.Comfort care measures placed.   Best practice:  Diet: tube feeds Pain/Anxiety/Delirium protocol (if indicated): per protocol VAP protocol (if indicated): per protocol DVT prophylaxis: SCDs GI prophylaxis: PPI Glucose control:  Mobility: bed rest Code Status: FULL Family Communication: family updated at bedside  Disposition: ICU  Critical care time: 35 minutes    Harvie Heck, MD Internal Medicine, PGY-2 October 06, 2019 8:09 AM Pager # [phone]

## 2019-10-09 NOTE — Progress Notes (Signed)
Discussed with Wadie Lessen from [place] and daughter at bedside. Planning for extubation this afternoon at 2pm.   BP 105/72   Pulse 88   Temp 97.9 F (36.6 C) (Oral)   Resp (!) 33   Ht 5' 4" (1.626 m)   Wt 86.7 kg   SpO2 100%   BMI 32.81 kg/m  Critically ill-appearing, on no sedation Scleral edema, no scleral icterus Pupils reactive, negative corneal and doll's eyes reflexes Negative pharyngeal and tracheal gag.  Breathing above the set rate on the vent.  Not withdrawing from painful stimulation in any extremity.  Down to 10ppm iNO. Remains on 100% FiO2. Daughter wishes to discontinue accuchecks and other potentially painful therapies at this point. Starting Versed infusion  Full note to follow.  Julian Hy, DO 10/11/2019 11:28 AM [facility]

## 2019-10-09 NOTE — Progress Notes (Signed)
LTM EEG discontinued - no skin breakdown at unhook.  

## 2019-10-09 NOTE — Progress Notes (Signed)
Chaplain provided support to Felicia [name]'s family as they visited her at bedside and prepared for extubation.  Family shared that Felicia [name] was brilliant and was great at remembering facts, history, and dates.  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  Her siblings shared how loving [name] was and her love for the holidays and caring for others.  Chaplain was able to provide the ministries of presence, listening and prayer.

## 2019-10-09 NOTE — Procedures (Signed)
Patient Name:Marvetta Viviano TWS:[id number] Epilepsy Attending:Jovie Swanner Barbra Sarks Referring Physician/Provider:Dr Donnetta Simpers Duration:10/30/19 0029 to [id number]  Patient history:48yo F s/o cardiac arrest. EEG to evaluate for seizure  Level of alertness:comatose  AEDs during EEG study:None  Technical aspects: This EEG study was done with scalp electrodes positioned according to the 10-20 International system of electrode placement. Electrical activity was acquired at a sampling rate of _0  and reviewed with a high frequency filter of _1  and a low frequency filter of _2 . EEG data were recorded continuously and digitally stored.   Description:EEG showed generalized, maximal bifrontal 5-_3  sharply contoured theta- alpha activity lasting for few minutes at a time followed by generalized background suppression.  EEG was reactive to tactile stimulation  ABNORMALITY -Intermittent slow, generalized -Burst suppression, generalized  IMPRESSION: This studyis suggestive of profound diffuseencephalopathy, nonspecific etiology but likely related to anoxic/hypoxic brain injury. No definite seizures were seen during the study.  Baran Kuhrt Barbra Sarks

## 2019-10-09 NOTE — Death Summary Note (Signed)
Physician Discharge Summary  Patient ID: Felicia [name] MRN: [id number] DOB/AGE: 10/28/1970 49 y.o.  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 Chronic cor pulmonale Insulin-dependent diabetes Elevated troponin Lactic acidosis Hypokalemia P on chronic anticoagulation Prolonged QTC  Discharge Diagnoses:  Principal Problem:   Acute on chronic respiratory failure with hypoxia (HCC) Active Problems:   Acute on chronic diastolic CHF (congestive heart failure) (HCC)   Goals of care, counseling/discussion   Interstitial lung disease (HCC)   Hypokalemia   History of DVT (deep vein thrombosis)   Prolonged Q-T interval on ECG   Uncontrolled diabetes mellitus with hyperglycemia (HCC)   Elevated troponin   Pressure injury of skin   Palliative care by specialist   Hypoxia   Abdominal pain   Atrial fibrillation ([place])   Constipation   Advanced care planning/counseling discussion   Encephalopathy Acute on chronic cor pulmonale Anoxic encephalopathy Cardiac arrest Cardiogenic shock due to acute on chronic right heart failure AKI Shock liver Acute on chronic anemia  Discharged Condition: Deceased  Hospital Course: Felicia [name] was admitted with acute on chronic respiratory failure.  She has had rapid deterioration over the past few months with multiple hospital admissions and severely increased oxygen requirements at home despite escalating therapy for her pulmonary hypertension and NSIP.  She ultimately developed progressively deteriorating respiratory failure necessitating endotracheal intubation.  Unfortunately she suffered a periintubation cardiac arrest with failure to recover neurologically.  Given her poor neurologic prognosis with failure to recover basic reflexes and life limiting chronic underlying lung disease, her family proceeded with palliative  withdrawal of care.  Multiple family members at bedside when she expired on 10/25/19 at 14:26.  Consults:  PCCM Palliative care Neurology Cardiology Advanced heart failure   Significant Diagnostic Studies:   Transthoracic echocardiogram 09/29/2019-LVEF 50 to 55%.  Low normal systolic LV function.  Severely dilated RV with severe systolic dysfunction.  Severely dilated RA.  09/05/2019 CT chest- IMPRESSION: 1. Pulmonary parenchymal pattern of fibrosis is unchanged and in keeping with biopsy-proven fibrotic nonspecific interstitial pneumonitis. 2. Enlarged pulmonic trunk, indicative pulmonary arterial hypertension.  Blood culture 09/04/2019-NG  Treatments:  Mechanical ventilation Inhaled nitric oxide Vasopressors Analgesia Antibiotics Immunosuppression for NSIP Antiarrhythmics Enteral nutrition Palliative anxiolytics    Disposition: Funeral home of the family's choosing     Signed: Julian Hy 10-25-2019, 3:36 PM

## 2019-10-09 NOTE — Progress Notes (Signed)
Patient ID: Felicia [name], female   DOB: [birth year], 49 y.o.   MRN: [id number]  This NP reviewed medical records, received report from team, assessed the patient and then met with the patient's family to include her daughter, her sister and her mother and several other family members for planned one way extubation today.  Focus of care is now comfort and dignity. Chaplain consulted  Extubation orders in place.  Symptom management orders in place.   Education offered on logistics of liberating patient form the ventilator and supporting patient as she transitions at EOL.  Education on the natural trajectory and expectations at EOL.     Stayed with family during extubation and until death.  Emotional support offered. Time of death 14:26  Questions and concerns addressed   PMT will continue to support holistically  Total time spent on the unit was 35 minutes   Discussed with Dr Carlis Abbott   Greater than 50% of the time was spent in counseling and coordination of care  Wadie Lessen NP  Palliative Medicine Team Team Phone # [phone] Pager [phone]

## 2019-10-09 DEATH — deceased

## 2019-10-29 ENCOUNTER — Telehealth: Payer: Self-pay | Admitting: Pulmonary Disease

## 2019-10-29 NOTE — Telephone Encounter (Signed)
Pt's plan of care from [place] has been located, signed by AO, and faxed to provided fax number on fax. Nothing further needed.

## 2020-12-25 IMAGING — DX DG CHEST 1V PORT
1 series · 1 of 1 positions shown · non-contrast
Comparison: One-view chest x-ray 09/17/2019

CLINICAL DATA: Respiratory failure.

EXAM:
PORTABLE CHEST 1 VIEW

[chest]
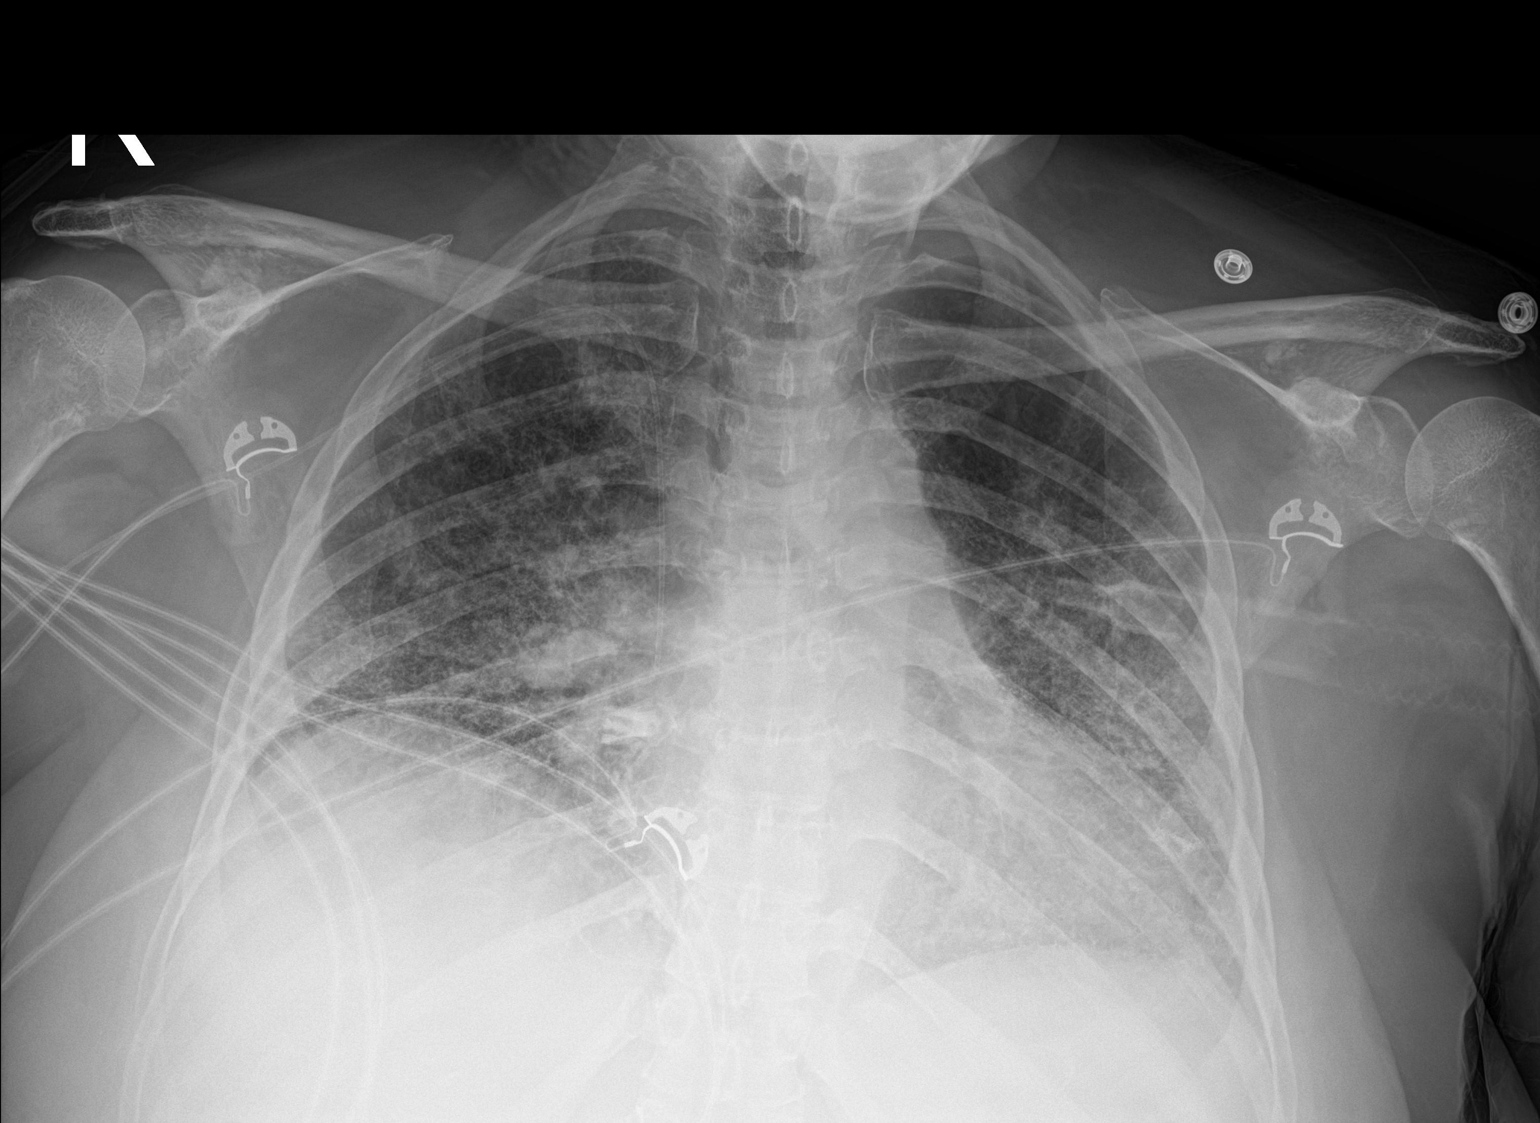

[1 of 1 positions shown; findings below may reference images not displayed]

FINDINGS: Heart is enlarged. Lung volumes remain low. Right-sided PICC line is
stable. Interstitial and airspace opacities are slightly improved.
IMPRESSION: 1. Slight improvement in interstitial and airspace disease.
2. Stable cardiomegaly.

## 2021-01-03 IMAGING — DX DG CHEST 1V PORT
1 series · 2 of 2 positions shown · non-contrast
Comparison: Prior chest x-ray 09/29/2019 at [DATE] a.m.

CLINICAL DATA: 48-year-old female status post intubation

EXAM:
PORTABLE CHEST 1 VIEW

[Series 1: chest ap · 0.14mm/px · 2 of 2 slices shown]
[im 1/2]
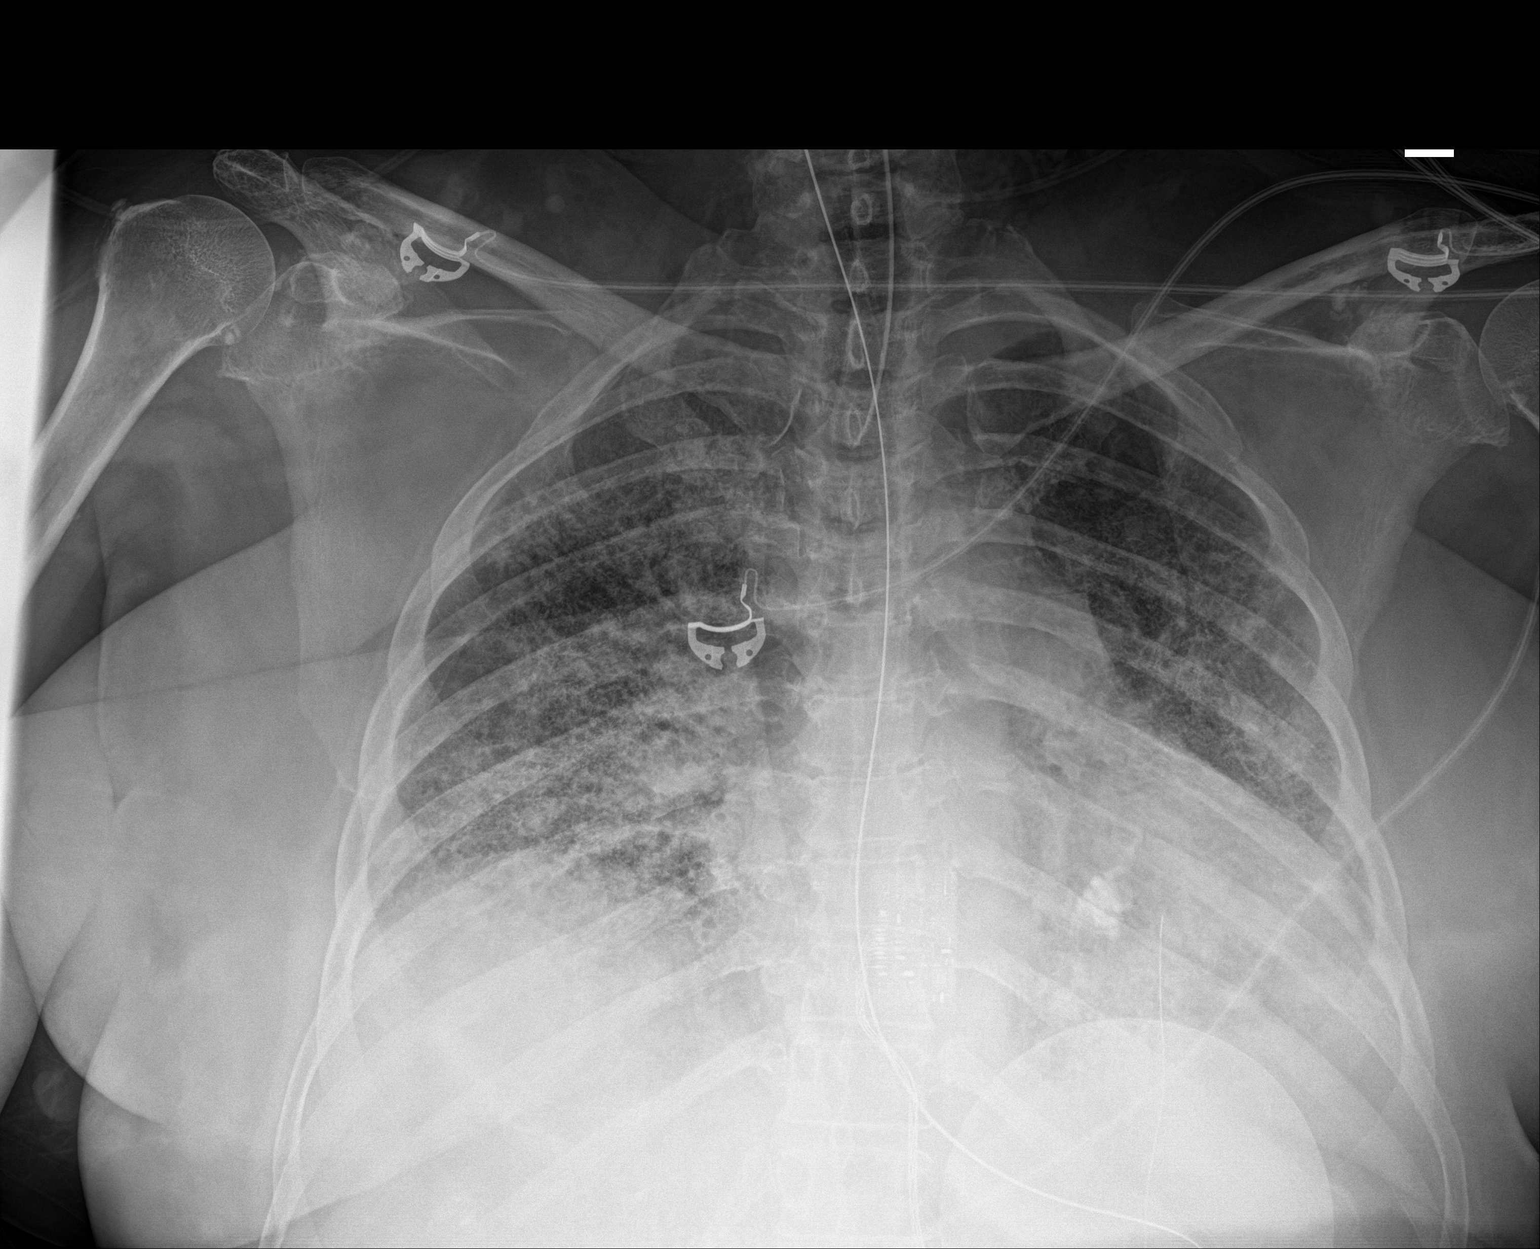
[im 2/2]
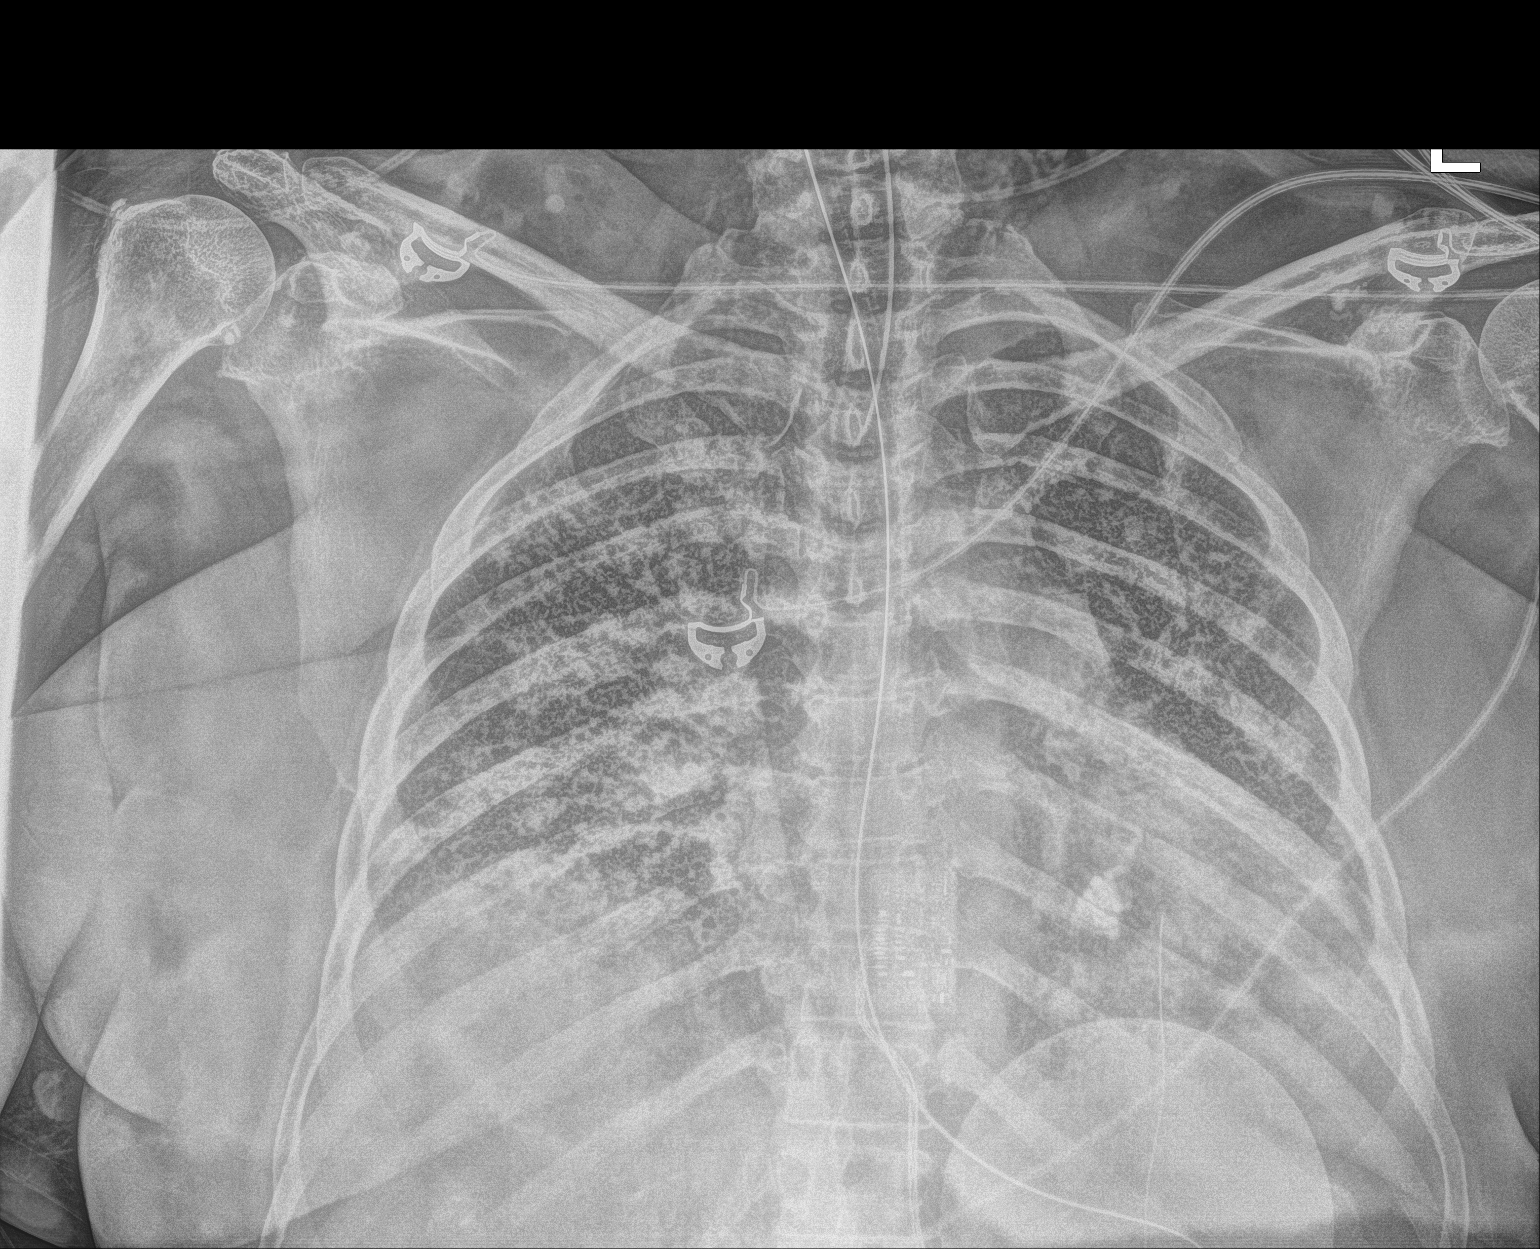

[2 of 2 positions shown; findings below may reference images not displayed]

FINDINGS: The patient has been intubated. The tip of the endotracheal tube is
5.2 cm above the carina. A gastric tube is present. The tip of the
tube lies off the field of view, presumably within the stomach. An
external defibrillator pad projects over the left chest.
Cardiomegaly with pulmonary vascular congestion and diffuse
bilateral interstitial prominence. Suspect layering left pleural
effusion. No pneumothorax. Inspiratory volumes are low.
IMPRESSION: 1. Interval intubation. The tip of the endotracheal tube is in good
position 5.2 cm above the carina.
2. Nasogastric tube has been placed. The tip lies off the field of
view, presumably within the stomach.
3. Cardiomegaly with diffuse bilateral interstitial airspace
opacities. Suspect pulmonary edema superimposed on a background of
known interstitial pneumonitis.

## 2021-01-05 IMAGING — DX DG CHEST 1V PORT
1 series · 1 of 1 positions shown · non-contrast
Comparison: Yesterday

CLINICAL DATA: Question aspiration.  Cardiac arrest.

EXAM:
PORTABLE CHEST 1 VIEW

[chest]
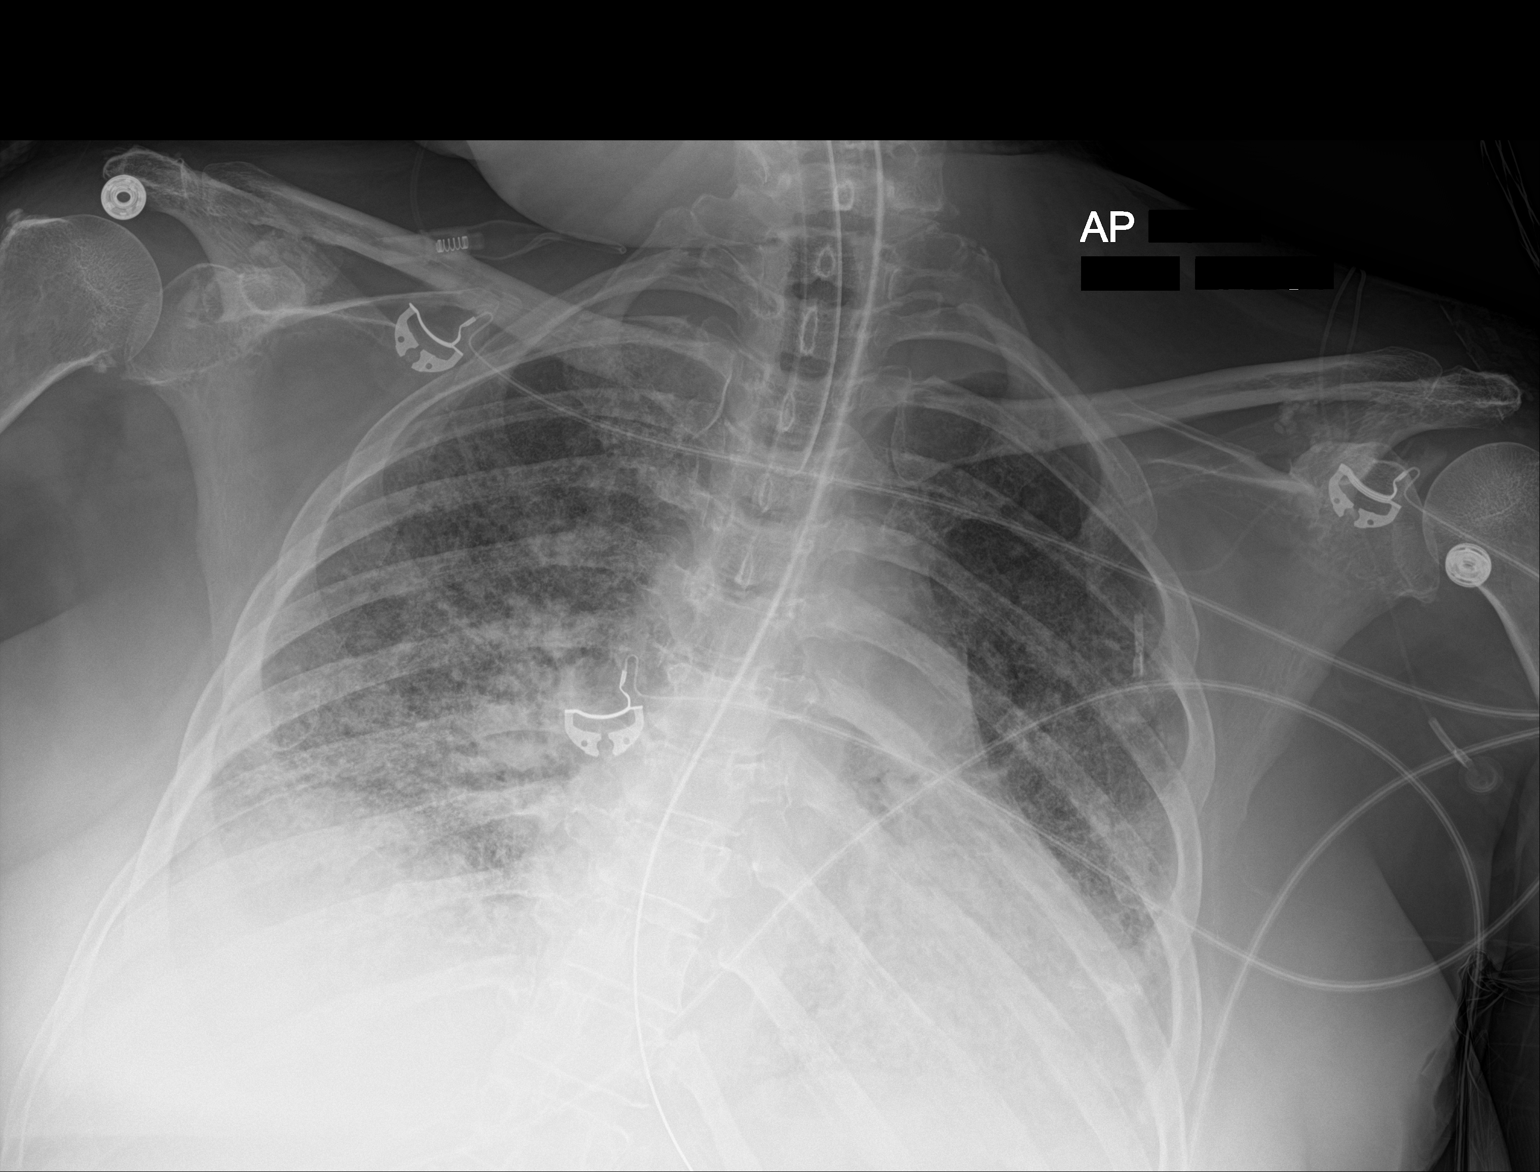

[1 of 1 positions shown; findings below may reference images not displayed]

FINDINGS: Extensive pulmonary opacity. Low lung volumes. There is a background
of chronic lung disease with fibrosis and traction bronchiectasis by
CT. Cardiomegaly. The endotracheal tube tip is essentially at the
clavicular heads. The enteric tube reaches the stomach. Negative for
pneumothorax.
IMPRESSION: 1. Acute on chronic pulmonary disease with mildly increased opacity
from yesterday.
2. Stable hardware positioning.

## 2021-01-05 IMAGING — DX DG ABDOMEN 1V
1 series · 1 of 1 positions shown · non-contrast
Comparison: Twenty days ago

CLINICAL DATA: Orogastric tube

EXAM:
ABDOMEN - 1 VIEW

[abdomen]
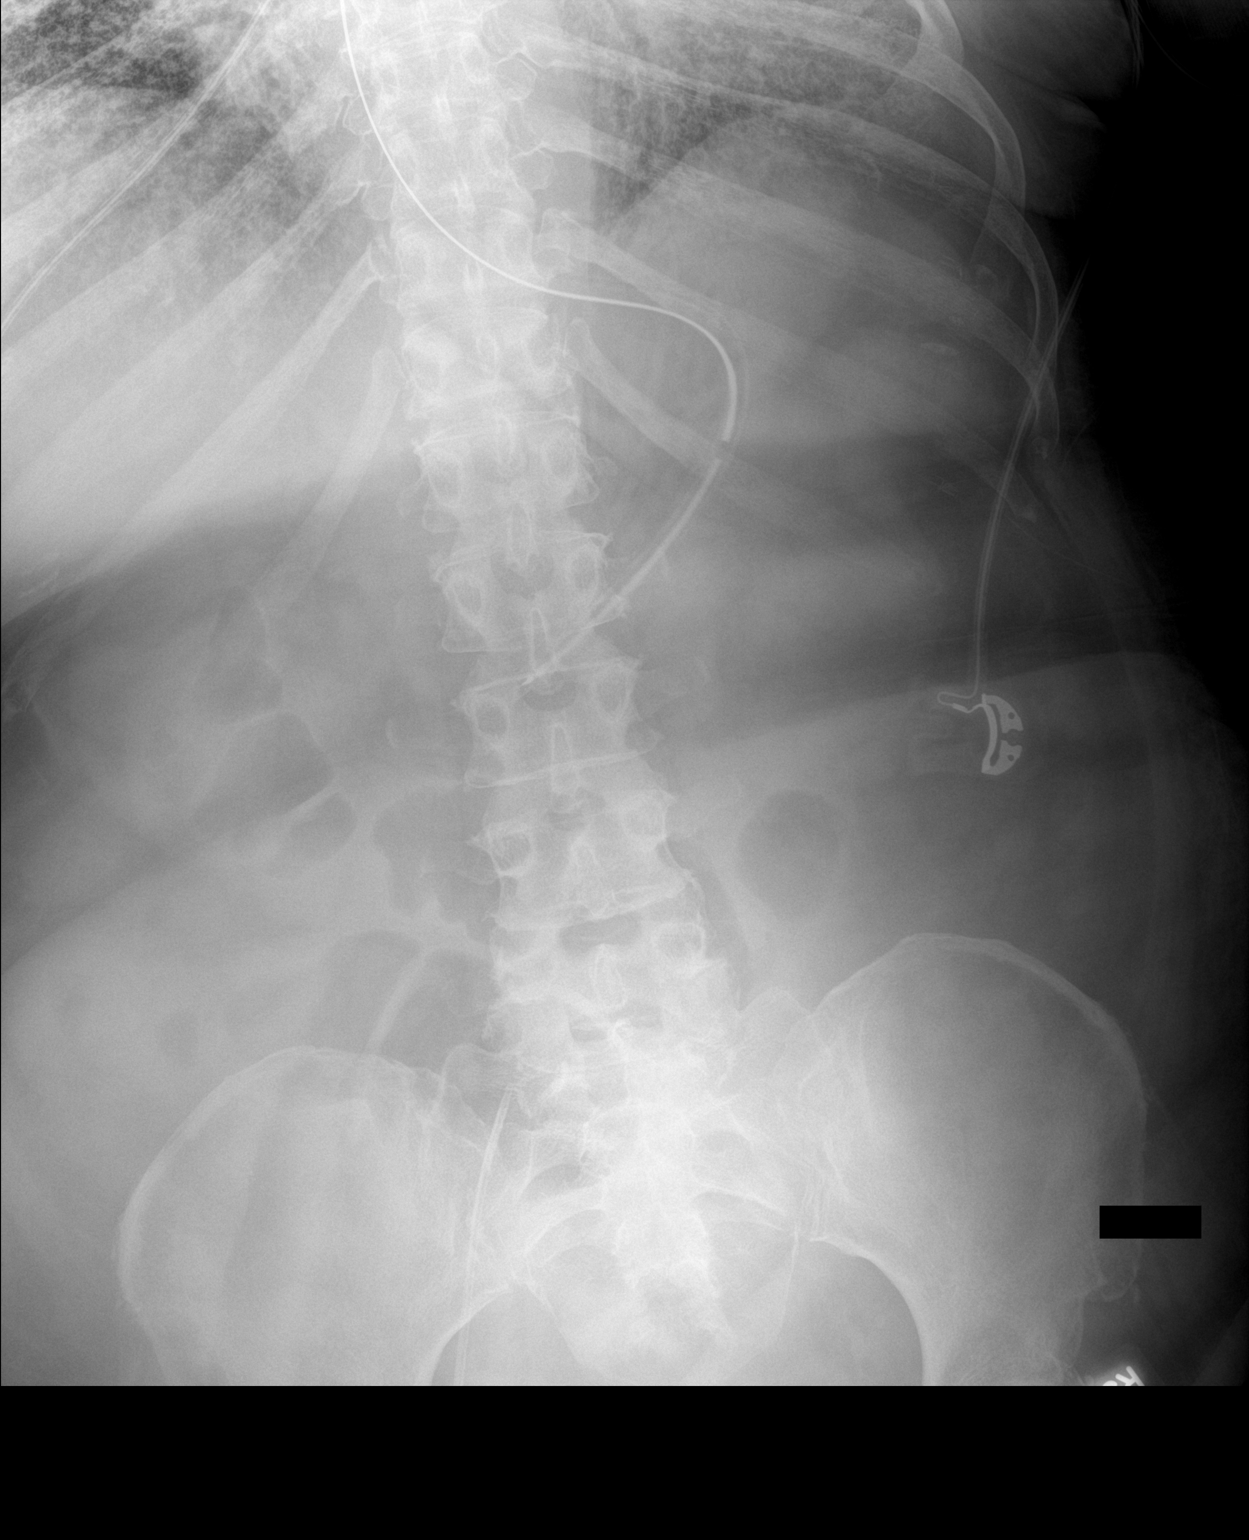

[1 of 1 positions shown; findings below may reference images not displayed]

FINDINGS: Nonobstructive bowel gas pattern. Femoral line on the right. Enteric
tube with tip at the distal stomach level. No concerning mass effect
or calcification. Known pulmonary disease.
IMPRESSION: Orogastric tube tip overlaps the distal stomach.

## 2023-09-12 NOTE — Progress Notes (Signed)
 This encounter was created in error - please disregard.
